# Patient Record
Sex: Female | Born: 1935 | Race: White | Hispanic: No | Smoking: Never smoker
Health system: Southern US, Community
[De-identification: ages and names within clinical notes are randomized; demographics above are authoritative.]

## PROBLEM LIST (undated history)

## (undated) DIAGNOSIS — K5792 Diverticulitis of intestine, part unspecified, without perforation or abscess without bleeding: Secondary | ICD-10-CM

## (undated) DIAGNOSIS — Z8719 Personal history of other diseases of the digestive system: Secondary | ICD-10-CM

## (undated) DIAGNOSIS — E559 Vitamin D deficiency, unspecified: Secondary | ICD-10-CM

## (undated) DIAGNOSIS — R011 Cardiac murmur, unspecified: Secondary | ICD-10-CM

## (undated) DIAGNOSIS — K219 Gastro-esophageal reflux disease without esophagitis: Secondary | ICD-10-CM

## (undated) DIAGNOSIS — E785 Hyperlipidemia, unspecified: Secondary | ICD-10-CM

## (undated) DIAGNOSIS — C801 Malignant (primary) neoplasm, unspecified: Secondary | ICD-10-CM

## (undated) DIAGNOSIS — R251 Tremor, unspecified: Secondary | ICD-10-CM

## (undated) DIAGNOSIS — I5033 Acute on chronic diastolic (congestive) heart failure: Secondary | ICD-10-CM

## (undated) DIAGNOSIS — M1990 Unspecified osteoarthritis, unspecified site: Secondary | ICD-10-CM

## (undated) DIAGNOSIS — K5730 Diverticulosis of large intestine without perforation or abscess without bleeding: Secondary | ICD-10-CM

## (undated) DIAGNOSIS — I8390 Asymptomatic varicose veins of unspecified lower extremity: Secondary | ICD-10-CM

## (undated) DIAGNOSIS — Z8701 Personal history of pneumonia (recurrent): Secondary | ICD-10-CM

## (undated) DIAGNOSIS — M810 Age-related osteoporosis without current pathological fracture: Secondary | ICD-10-CM

## (undated) DIAGNOSIS — R7303 Prediabetes: Secondary | ICD-10-CM

## (undated) DIAGNOSIS — IMO0002 Reserved for concepts with insufficient information to code with codable children: Secondary | ICD-10-CM

## (undated) DIAGNOSIS — I10 Essential (primary) hypertension: Secondary | ICD-10-CM

## (undated) HISTORY — PX: THYROID SURGERY: SHX805

## (undated) HISTORY — PX: OTHER SURGICAL HISTORY: SHX169

## (undated) HISTORY — DX: Reserved for concepts with insufficient information to code with codable children: IMO0002

## (undated) HISTORY — DX: Prediabetes: R73.03

## (undated) HISTORY — PX: APPENDECTOMY: SHX54

## (undated) HISTORY — DX: Diverticulosis of large intestine without perforation or abscess without bleeding: K57.30

## (undated) HISTORY — PX: BREAST SURGERY: SHX581

## (undated) HISTORY — PX: COLONOSCOPY: SHX174

## (undated) HISTORY — PX: WRIST FRACTURE SURGERY: SHX121

## (undated) HISTORY — PX: ESOPHAGOGASTRODUODENOSCOPY: SHX1529

## (undated) HISTORY — PX: BREAST EXCISIONAL BIOPSY: SUR124

## (undated) HISTORY — DX: Unspecified osteoarthritis, unspecified site: M19.90

## (undated) HISTORY — DX: Vitamin D deficiency, unspecified: E55.9

## (undated) HISTORY — PX: JOINT REPLACEMENT: SHX530

## (undated) HISTORY — PX: TUBAL LIGATION: SHX77

## (undated) HISTORY — DX: Hyperlipidemia, unspecified: E78.5

## (undated) HISTORY — PX: TONSILLECTOMY: SUR1361

---
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
## 1998-05-28 ENCOUNTER — Ambulatory Visit (HOSPITAL_COMMUNITY): Admission: RE | Admit: 1998-05-28 | Discharge: 1998-05-28 | Payer: Self-pay | Admitting: Surgery

## 1998-10-16 ENCOUNTER — Encounter: Payer: Self-pay | Admitting: Internal Medicine

## 1998-10-16 ENCOUNTER — Ambulatory Visit (HOSPITAL_COMMUNITY): Admission: RE | Admit: 1998-10-16 | Discharge: 1998-10-16 | Payer: Self-pay | Admitting: Internal Medicine

## 1999-05-07 ENCOUNTER — Other Ambulatory Visit: Admission: RE | Admit: 1999-05-07 | Discharge: 1999-05-07 | Payer: Self-pay | Admitting: Internal Medicine

## 1999-11-19 ENCOUNTER — Encounter: Payer: Self-pay | Admitting: Internal Medicine

## 1999-11-19 ENCOUNTER — Ambulatory Visit (HOSPITAL_COMMUNITY): Admission: RE | Admit: 1999-11-19 | Discharge: 1999-11-19 | Payer: Self-pay | Admitting: Internal Medicine

## 2000-02-26 ENCOUNTER — Other Ambulatory Visit: Admission: RE | Admit: 2000-02-26 | Discharge: 2000-02-26 | Payer: Self-pay | Admitting: Gynecology

## 2000-02-26 ENCOUNTER — Encounter (INDEPENDENT_AMBULATORY_CARE_PROVIDER_SITE_OTHER): Payer: Self-pay | Admitting: Specialist

## 2000-12-08 ENCOUNTER — Ambulatory Visit (HOSPITAL_COMMUNITY): Admission: RE | Admit: 2000-12-08 | Discharge: 2000-12-08 | Payer: Self-pay | Admitting: Internal Medicine

## 2000-12-08 ENCOUNTER — Encounter: Payer: Self-pay | Admitting: Internal Medicine

## 2001-08-03 ENCOUNTER — Other Ambulatory Visit: Admission: RE | Admit: 2001-08-03 | Discharge: 2001-08-03 | Payer: Self-pay | Admitting: Gynecology

## 2001-08-17 ENCOUNTER — Encounter: Admission: RE | Admit: 2001-08-17 | Discharge: 2001-08-17 | Payer: Self-pay | Admitting: Gynecology

## 2001-08-17 ENCOUNTER — Encounter: Payer: Self-pay | Admitting: Gynecology

## 2001-12-22 ENCOUNTER — Encounter: Payer: Self-pay | Admitting: Internal Medicine

## 2001-12-22 ENCOUNTER — Ambulatory Visit (HOSPITAL_COMMUNITY): Admission: RE | Admit: 2001-12-22 | Discharge: 2001-12-22 | Payer: Self-pay | Admitting: Internal Medicine

## 2002-01-18 ENCOUNTER — Ambulatory Visit (HOSPITAL_COMMUNITY): Admission: RE | Admit: 2002-01-18 | Discharge: 2002-01-18 | Payer: Self-pay | Admitting: Gynecology

## 2002-01-18 ENCOUNTER — Encounter: Payer: Self-pay | Admitting: Gynecology

## 2002-08-16 ENCOUNTER — Other Ambulatory Visit: Admission: RE | Admit: 2002-08-16 | Discharge: 2002-08-16 | Payer: Self-pay | Admitting: Gynecology

## 2003-02-14 ENCOUNTER — Encounter: Payer: Self-pay | Admitting: Internal Medicine

## 2003-02-14 ENCOUNTER — Ambulatory Visit (HOSPITAL_COMMUNITY): Admission: RE | Admit: 2003-02-14 | Discharge: 2003-02-14 | Payer: Self-pay | Admitting: Internal Medicine

## 2003-09-10 ENCOUNTER — Other Ambulatory Visit: Admission: RE | Admit: 2003-09-10 | Discharge: 2003-09-10 | Payer: Self-pay | Admitting: Gynecology

## 2004-02-09 ENCOUNTER — Emergency Department (HOSPITAL_COMMUNITY): Admission: EM | Admit: 2004-02-09 | Discharge: 2004-02-09 | Payer: Self-pay | Admitting: *Deleted

## 2004-04-22 ENCOUNTER — Ambulatory Visit (HOSPITAL_COMMUNITY): Admission: RE | Admit: 2004-04-22 | Discharge: 2004-04-22 | Payer: Self-pay | Admitting: Internal Medicine

## 2004-12-01 ENCOUNTER — Ambulatory Visit (HOSPITAL_COMMUNITY): Admission: RE | Admit: 2004-12-01 | Discharge: 2004-12-01 | Payer: Self-pay | Admitting: Internal Medicine

## 2004-12-05 ENCOUNTER — Ambulatory Visit (HOSPITAL_COMMUNITY): Admission: RE | Admit: 2004-12-05 | Discharge: 2004-12-05 | Payer: Self-pay | Admitting: Internal Medicine

## 2005-04-24 ENCOUNTER — Ambulatory Visit (HOSPITAL_COMMUNITY): Admission: RE | Admit: 2005-04-24 | Discharge: 2005-04-24 | Payer: Self-pay | Admitting: Internal Medicine

## 2005-12-28 ENCOUNTER — Other Ambulatory Visit: Admission: RE | Admit: 2005-12-28 | Discharge: 2005-12-28 | Payer: Self-pay | Admitting: Gynecology

## 2006-02-25 ENCOUNTER — Ambulatory Visit (HOSPITAL_COMMUNITY): Admission: RE | Admit: 2006-02-25 | Discharge: 2006-02-25 | Payer: Self-pay | Admitting: Internal Medicine

## 2006-04-27 ENCOUNTER — Ambulatory Visit (HOSPITAL_COMMUNITY): Admission: RE | Admit: 2006-04-27 | Discharge: 2006-04-27 | Payer: Self-pay | Admitting: Internal Medicine

## 2006-10-19 ENCOUNTER — Emergency Department (HOSPITAL_COMMUNITY): Admission: EM | Admit: 2006-10-19 | Discharge: 2006-10-19 | Payer: Self-pay | Admitting: Emergency Medicine

## 2006-11-22 ENCOUNTER — Encounter: Admission: RE | Admit: 2006-11-22 | Discharge: 2006-11-22 | Payer: Self-pay | Admitting: Family Medicine

## 2006-11-24 ENCOUNTER — Ambulatory Visit: Payer: Self-pay | Admitting: Gastroenterology

## 2006-11-26 ENCOUNTER — Encounter: Admission: RE | Admit: 2006-11-26 | Discharge: 2006-11-26 | Payer: Self-pay | Admitting: Internal Medicine

## 2006-11-30 ENCOUNTER — Ambulatory Visit: Payer: Self-pay | Admitting: Gastroenterology

## 2007-01-04 ENCOUNTER — Ambulatory Visit: Payer: Self-pay | Admitting: Gastroenterology

## 2007-04-29 ENCOUNTER — Ambulatory Visit (HOSPITAL_COMMUNITY): Admission: RE | Admit: 2007-04-29 | Discharge: 2007-04-29 | Payer: Self-pay | Admitting: Internal Medicine

## 2008-05-14 ENCOUNTER — Ambulatory Visit (HOSPITAL_COMMUNITY): Admission: RE | Admit: 2008-05-14 | Discharge: 2008-05-14 | Payer: Self-pay | Admitting: Internal Medicine

## 2008-08-15 ENCOUNTER — Ambulatory Visit (HOSPITAL_COMMUNITY): Admission: RE | Admit: 2008-08-15 | Discharge: 2008-08-15 | Payer: Self-pay | Admitting: Internal Medicine

## 2008-10-08 ENCOUNTER — Ambulatory Visit (HOSPITAL_COMMUNITY): Admission: RE | Admit: 2008-10-08 | Discharge: 2008-10-08 | Payer: Self-pay | Admitting: Internal Medicine

## 2008-10-16 LAB — HM DEXA SCAN

## 2008-10-17 ENCOUNTER — Ambulatory Visit (HOSPITAL_COMMUNITY): Admission: RE | Admit: 2008-10-17 | Discharge: 2008-10-17 | Payer: Self-pay | Admitting: Internal Medicine

## 2009-05-21 ENCOUNTER — Ambulatory Visit (HOSPITAL_COMMUNITY): Admission: RE | Admit: 2009-05-21 | Discharge: 2009-05-21 | Payer: Self-pay | Admitting: Internal Medicine

## 2009-10-21 ENCOUNTER — Encounter: Payer: Self-pay | Admitting: Gastroenterology

## 2009-11-06 ENCOUNTER — Encounter (HOSPITAL_COMMUNITY): Admission: RE | Admit: 2009-11-06 | Discharge: 2010-01-20 | Payer: Self-pay | Admitting: Internal Medicine

## 2009-12-11 ENCOUNTER — Encounter: Payer: Self-pay | Admitting: Gastroenterology

## 2009-12-12 ENCOUNTER — Telehealth: Payer: Self-pay | Admitting: Gastroenterology

## 2010-01-20 ENCOUNTER — Ambulatory Visit: Payer: Self-pay | Admitting: Gastroenterology

## 2010-01-21 ENCOUNTER — Ambulatory Visit: Payer: Self-pay | Admitting: Gastroenterology

## 2010-03-10 ENCOUNTER — Encounter (INDEPENDENT_AMBULATORY_CARE_PROVIDER_SITE_OTHER): Payer: Self-pay | Admitting: Surgery

## 2010-03-10 ENCOUNTER — Observation Stay (HOSPITAL_COMMUNITY): Admission: RE | Admit: 2010-03-10 | Discharge: 2010-03-11 | Payer: Self-pay | Admitting: Surgery

## 2010-04-16 ENCOUNTER — Inpatient Hospital Stay (HOSPITAL_COMMUNITY): Admission: RE | Admit: 2010-04-16 | Discharge: 2010-04-20 | Payer: Self-pay | Admitting: Orthopedic Surgery

## 2010-06-30 ENCOUNTER — Ambulatory Visit (HOSPITAL_COMMUNITY): Admission: RE | Admit: 2010-06-30 | Discharge: 2010-06-30 | Payer: Self-pay | Admitting: Internal Medicine

## 2010-12-07 ENCOUNTER — Encounter: Payer: Self-pay | Admitting: Internal Medicine

## 2010-12-16 NOTE — Procedures (Signed)
Summary: Colonoscopy  Patient: Briana [name] Note: All result statuses are Final unless otherwise noted.  Tests: (1) Colonoscopy (COL)   COL Colonoscopy           [employer] [employer].     [place], [place]  [id number]           COLONOSCOPY PROCEDURE REPORT           PATIENT:  Briana [name], Briana [name]  MR#:  [id number]     BIRTHDATE:  02-15-1934, 76 yrs. old  GENDER:  female           ENDOSCOPIST:  Norberto Sorenson T. Fuller Plan, MD, [facility]     Referred by:           PROCEDURE DATE:  01/21/2010     PROCEDURE:  Colonoscopy [id number]     ASA CLASS:  Class II     INDICATIONS:  1) hematochezia 2) LLQ pain           MEDICATIONS:   Fentanyl 100 mcg IV, Versed 10 mg IV           DESCRIPTION OF PROCEDURE:   After the risks benefits and     alternatives of the procedure were thoroughly explained, informed     consent was obtained.  Digital rectal exam was performed and     revealed no abnormalities.   The LB PCF-H180AL [id number] endoscope     was introduced through the anus and advanced to the cecum, which     was identified by both the appendix and ileocecal valve, without     limitations.  The quality of the prep was good, using MoviPrep.     The instrument was then slowly withdrawn as the colon was fully     examined.     <<PROCEDUREIMAGES>>           FINDINGS:  Moderate diverticulosis was found sigmoid to descending     A normal appearing cecum, ileocecal valve, and appendiceal orifice     were identified. The ascending, hepatic flexure, transverse,     splenic flexure, descending, sigmoid colon, and rectum appeared     unremarkable.   Retroflexed views in the rectum revealed internal     hemorrhoids., small.  The time to cecum =  6  minutes. The scope     was then withdrawn (time =  10  min) from the patient and the     procedure completed.           COMPLICATIONS:  None           ENDOSCOPIC IMPRESSION:     1) Moderate diverticulosis in the sigmoid to descending     2) Internal  hemorrhoids           RECOMMENDATIONS:     1) High fiber diet with liberal fluid intake.     2) Continue current colorectal screening recommendations for     "routine risk" patients with a repeat colonoscopy in 10 years.     3) Presumed hemorrhoidal bleeding has resolved     4) Consider antispasmotics if LLQ pain persists           Malcolm T. Fuller Plan, MD, Mar[facility] Regal           CC: Johnathan Hausen, MD, Gaynelle Arabian, MD           n.     Lorrin MaisPricilla Riffle. Stark at 01/21/2010 04:40 PM  [place], [place], [id number]  Note: An exclamation mark (!) indicates a result that was not dispersed into the flowsheet. Document Creation Date: 01/21/2010 4:40 PM _______________________________________________________________________  (1) Order result status: Final Collection or observation date-time: 01/21/2010 16:34 Requested date-time:  Receipt date-time:  Reported date-time:  Referring Physician:   Ordering Physician: Lucio Edward [phone]) Specimen Source:  Source: Tawanna Cooler Order Number: [id number] Lab site:   Appended Document: Colonoscopy    Clinical Lists Changes  Observations: Added new observation of COLONNXTDUE: 01/2020 (01/21/2010 16:41)

## 2010-12-16 NOTE — Procedures (Signed)
Summary: Colonoscopy   Colonoscopy  Procedure date:  11/30/2006  Findings:      Location:  [place].   Patient Name: Briana [name], Briana [name]. MRN:  Procedure Procedures: Colonoscopy CPT: [id number].  Personnel: Endoscopist: [name]. Fuller Plan, MD, [facility] Reg[facility].  Referred By: Kirtland Bouchard, MD.  Exam Location: Exam performed in Outpatient Clinic. Outpatient  Patient Consent: Procedure, Alternatives, Risks and Benefits discussed, consent obtained, from patient. Consent was obtained by the RN.  Indications  Abnormal Exams, Studies: CT scan, abnormal, do not suspect malignancy.  Comments: Ongoing LLQ pain, recently resolved diverticulitis, thickened sigmoid colon on F/U CT scan. History  Current Medications: Patient is not currently taking Coumadin.  Pre-Exam Physical: Performed Nov 30, 2006. Cardio-pulmonary exam, Rectal exam, HEENT exam , Abdominal exam, Mental status exam WNL.  Comments: Pt. history reviewed/updated, physical exam performed prior to initiation of sedation?Yes Exam Exam: Extent of exam reached: Cecum, extent intended: Cecum.  The cecum was identified by appendiceal orifice and IC valve. Time to Cecum: 00:04: 48. Time for Withdrawl: 00:10:52. Colon retroflexion performed. ASA Classification: II. Tolerance: excellent.  Monitoring: Pulse and BP monitoring, Oximetry used. Supplemental O2 given.  Colon Prep Used MoviPrep for colon prep. Prep results: good.  Sedation Meds: Patient assessed and found to be appropriate for moderate (conscious) sedation. Fentanyl 100 mcg. given IV. Versed 10 mg. given IV.  Findings - DIVERTICULOSIS: Descending Colon to Sigmoid Colon. Not bleeding. ICD9: Diverticulosis: 562.10. Comments: with spasm.  NORMAL EXAM: Cecum to Splenic Flexure.  HEMORRHOIDS: Internal. Size: Small. Not bleeding. Not thrombosed. ICD9: Hemorrhoids, Internal: 455.0.   Assessment  Diagnoses: 562.10: Diverticulosis.  455.0: Hemorrhoids, Internal.    Events  Unplanned Interventions: No intervention was required.  Unplanned Events: There were no complications. Plans  Post Exam Instructions: Post sedation instructions given.  Medication Plan: Antispasmodics: Hyoscyamine PO 30 mg BID, for 4 wks.   Patient Education: Patient given standard instructions for: Diverticulosis. Hemorrhoids.  Disposition: After procedure patient sent to recovery. After recovery patient sent home.  Scheduling/Referral: Office Visit, to [employer]. Fuller Plan, MD, [facility], around Dec 31, 2006.    cc: Unk Pinto, MD  This report was created from the original endoscopy report, which was reviewed and signed by the above listed endoscopist.

## 2010-12-16 NOTE — Assessment & Plan Note (Signed)
Summary: discuss possible colon/follow up triage call/sheri   History of Present Illness Visit Type: Follow-up Visit Primary GI MD: Joylene Igo MD [facility] Primary Provider: Unk Pinto, MD Chief Complaint: possible colonoscopy before hip replacement surgery, patient had small amount of blood in stool 1 time History of Present Illness:   Briana [name] returns for followup of her daughter on the advice of Dr. Johnathan Hausen. She had several episodes of small volume hematochezia while on NSAIDs. She previously underwent colonoscopy in January 2008 which showed diverticulosis with spasm and internal hemorrhoids. Dr. Hassell Done requested further evaluation of her rectal bleeding  prior to scheduling a parathyroid adenectomy. Subsequent to that she has plans for left hip replacement with Dr. Hector Shade.  She has mild chronic left lower quadrant pain with mild constipation. Neither have changed in frequency, or severity.   GI Review of Systems    Reports abdominal pain and  acid reflux.     Location of  Abdominal pain: left side.    Denies belching, bloating, chest pain, dysphagia with liquids, dysphagia with solids, heartburn, loss of appetite, nausea, vomiting, vomiting blood, weight loss, and  weight gain.      Reports constipation and  rectal bleeding.     Denies anal fissure, black tarry stools, change in bowel habit, diarrhea, diverticulosis, fecal incontinence, heme positive stool, hemorrhoids, irritable bowel syndrome, jaundice, light color stool, liver problems, and  rectal pain.   Current Medications (verified): 1)  Pravastatin Sodium 40 Mg Tabs (Pravastatin Sodium) .... Take 1/2 Tablet At Bedtime 2)  Evista 60 Mg Tabs (Raloxifene Hcl) .... Take 1 Tablet By Mouth Once Daily 3)  Ranitidine Hcl 150 Mg Tabs (Ranitidine Hcl) .... Take 1 Tablet By Mouth Two Times A Day 4)  Benicar 40 Mg Tabs (Olmesartan Medoxomil) .... Take 1/2 Tablet By Mouth Once Daily 5)  Bumetanide 1 Mg Tabs (Bumetanide)  .... Take 1/2 Tablet By Mouth Once Daily 6)  Aspir-Low 81 Mg Tbec (Aspirin) .... Once Daily 7)  Multivitamins  Tabs (Multiple Vitamin) .... Once Daily 8)  Osteo Bi-Flex Adv Triple St  Tabs (Misc Natural Products) .... Two Times A Day 9)  Vitamin D 2000iu .... Two Times A Day  Allergies (verified): No Known Drug Allergies  Past History:  Past Medical History: Reviewed history from 01/17/2010 and no changes required. Diverticulitis Diverticulosis GERD Hyperlipidemia Hypertension Skin cancer  Hemorrhoids  Past Surgical History: Appendectomy Bilateral tubal ligation C-section Varicose vein stripping Uterine polyps removed Breast surgery-benign cyst Arm surgery (L) x2  Family History: Reviewed history from 01/17/2010 and no changes required. Family History of Heart Disease: Sister Family History of Diabetes: Sister  Social History: Reviewed history from 01/17/2010 and no changes required. Widowed Retired Patient has never smoked.  Alcohol Use - no Illicit Drug Use - no  Review of Systems       The patient complains of allergy/sinus, arthritis/joint pain, sleeping problems, and urine leakage.  The patient denies anemia, anxiety-new, back pain, blood in urine, breast changes/lumps, change in vision, confusion, cough, coughing up blood, depression-new, fainting, fatigue, fever, headaches-new, hearing problems, heart murmur, heart rhythm changes, itching, menstrual pain, muscle pains/cramps, nosebleeds, pregnancy symptoms, shortness of breath, skin rash, sore throat, swelling of feet/legs, swollen lymph glands, thirst - excessive , urination - excessive , urination changes/pain, vision changes, and voice change.    Vital Signs:  Patient profile:   77 year old female Height:      62 inches Weight:  172 pounds BMI:     31.57 Pulse rate:   68 / minute Pulse rhythm:   regular BP sitting:   120 / 70  (left arm) Cuff size:   regular  Vitals Entered By: June McMurray [place]  Deborra Medina) (January 20, 2010 3:18 PM)  Physical Exam  General:  Well developed, well nourished, no acute distress. Head:  Normocephalic and atraumatic. Eyes:  PERRLA, no icterus. Mouth:  No deformity or lesions, dentition normal. Neck:  Supple; no masses or thyromegaly. Lungs:  Clear throughout to auscultation. Abdomen:  Soft and nondistended. No masses, hepatosplenomegaly or hernias noted. Normal bowel sounds. Minimal left lower quadrant tenderness to deep palpation without rebound or guarding. Rectal:  deferred until time of colonoscopy.   Neurologic:  Alert and  oriented x4;  grossly normal neurologically. Cervical Nodes:  No significant cervical adenopathy. Inguinal Nodes:  No significant inguinal adenopathy. Psych:  Alert and cooperative. Normal mood and affect.  Impression & Recommendations:  Problem # 1:  BLOOD IN STOOL (ICD-578.1) Suspected hemorrhoidal bleeding. Rule out colorectal neoplasms, proctitis, and other disorders. The risks, benefits and alternatives to colonoscopy with possible biopsy, possible destruction of hemorrhoids and possible polypectomy were discussed with the patient and they consent to proceed. The procedure will be scheduled electively. Orders: Colonoscopy (Colon)  Problem # 2:  ABDOMINAL PAIN-LLQ (ICD-789.04) Chronic intermittent mild left lower quadrant pain with mild constipation. Rule out IBS or painful diverticular disease. Maintain a high-fiber diet with adequate water intake. Consider trial of antispasmodics pending colonoscopy.  Patient Instructions: 1)  Colonoscopy brochure given.  2)  Conscious Sedation brochure given.  3)  Copy sent to : Unk Pinto, MD 4)                          Johnathan Hausen, MD 5)                          Hector Shade, MD 6)  The medication list was reviewed and reconciled.  All changed / newly prescribed medications were explained.  A complete medication list was provided to the patient /  caregiver.  Prescriptions: MOVIPREP 100 GM  SOLR (PEG-KCL-NACL-NASULF-NA ASC-C) As per prep instructions.  #1 x 0   Entered by:   Marlon Pel CMA ([place])   Authorized by:   Ladene Artist MD [facility]   Signed by:   Ladene Artist MD [facility] on 01/20/2010   Method used:   Electronically to        [facility] (retail)       [address]       [place],   [address]       Ph: [phone]       Fax: [phone]   RxID:   [id number]

## 2010-12-16 NOTE — Letter (Signed)
Summary: [facility] Instructions  [facility]  [place], [place] [address]   Phone: [phone]  Fax: [phone]       Briana [name]    1934/03/27    MRN: [id number]        Procedure Day /Date: Tuesday March 8th, 2011     Arrival Time: 3:00pm     Procedure Time: 4:00pm     Location of Procedure:                    _x _  [place] (4th Floor)                        PREPARATION FOR COLONOSCOPY WITH MOVIPREP     THE DAY BEFORE YOUR PROCEDURE         DATE:  today   DAY:   1.  Drink clear liquids the entire day-NO SOLID FOOD  2.  Do not drink anything colored red or purple.  Avoid juices with pulp.  No orange juice.  3.  Drink at least 64 oz. (8 glasses) of fluid/clear liquids during the day to prevent dehydration and help the prep work efficiently.  CLEAR LIQUIDS INCLUDE: Water Jello Ice Popsicles Tea (sugar ok, no milk/cream) Powdered fruit flavored drinks Coffee (sugar ok, no milk/cream) Gatorade Juice: apple, white grape, white cranberry  Lemonade Clear bullion, consomm, broth Carbonated beverages (any kind) Strained chicken noodle soup Hard Candy                             4.  In the morning, mix first dose of MoviPrep solution:    Empty 1 Pouch A and 1 Pouch B into the disposable container    Add lukewarm drinking water to the top line of the container. Mix to dissolve    Refrigerate (mixed solution should be used within 24 hrs)  5.  Begin drinking the prep at 5:00 p.m. The MoviPrep container is divided by 4 marks.   Every 15 minutes drink the solution down to the next mark (approximately 8 oz) until the full liter is complete.   6.  Follow completed prep with 16 oz of clear liquid of your choice (Nothing red or purple).  Continue to drink clear liquids until bedtime.  7.  Before going to bed, mix second dose of MoviPrep solution:    Empty 1 Pouch A and 1 Pouch B into the disposable container    Add  lukewarm drinking water to the top line of the container. Mix to dissolve    Refrigerate  THE DAY OF YOUR PROCEDURE      DATE:  01/21/10 DAY:  Tuesday  Beginning at  11:00a.m. (5 hours before procedure):         1. Every 15 minutes, drink the solution down to the next mark (approx 8 oz) until the full liter is complete.  2. Follow completed prep with 16 oz. of clear liquid of your choice.    3. You may drink clear liquids until 2:00pm (2 HOURS BEFORE PROCEDURE).   MEDICATION INSTRUCTIONS  Unless otherwise instructed, you should take regular prescription medications with a small sip of water   as early as possible the morning of your procedure.          OTHER INSTRUCTIONS  You will need a responsible adult at least 75 years of age to accompany you and drive  you home.   This person must remain in the waiting room during your procedure.  Wear loose fitting clothing that is easily removed.  Leave jewelry and other valuables at home.  However, you may wish to bring a book to read or  an iPod/MP3 player to listen to music as you wait for your procedure to start.  Remove all body piercing jewelry and leave at home.  Total time from sign-in until discharge is approximately 2-3 hours.  You should go home directly after your procedure and rest.  You can resume normal activities the  day after your procedure.  The day of your procedure you should not:   Drive   Make legal decisions   Operate machinery   Drink alcohol   Return to work  You will receive specific instructions about eating, activities and medications before you leave.    The above instructions have been reviewed and explained to me by   Estill Bamberg.     I fully understand and can verbalize these instructions _____________________________ Date _________

## 2010-12-16 NOTE — Progress Notes (Signed)
Summary: Triage  Phone Note Call from Patient Call back at Home Phone [phone]   Caller: Patient Call For: Dr. Fuller Plan Reason for Call: Talk to Nurse Summary of Call: Pt. has had some rectal bleeding. She also saw Dr. Hassell Done and he is suggesting another colonoscopy. Wants to talk with you concerning that. Initial call taken by: Webb Laws,  December 12, 2009 11:04 AM  Follow-up for Phone Call        Patient was seen by Dr Kaylyn Lim for consult for  a thyroidectomy, during her appointment with him she mentioned that a few months ago she had an isolated incident of a large amount of bright red rectal bleeding.  She was on Meloxicam at that time and she feels this was the cause, she stopped this and had no other bleeding.  This was an isolated incident.  Dr Hassell Done is recommending a colon.  Please advise if this is ok for a direct schedule or should she have REV.  I have copied in her last colon from 08 Follow-up by: [name] RN, CGRN,  December 12, 2009 11:17 AM  Additional Follow-up for Phone Call Additional follow up Details #1::        Please FAX a copy of the colonoscopy to Dr. Hassell Done. Not sure she needs another colonoscopy. Please schedule REV to further discuss. Additional Follow-up by: Ladene Artist MD Marval Regal,  December 13, 2009 1:00 PM    Additional Follow-up for Phone Call Additional follow up Details #2::    Left message for patient to call back [place], CGRN  December 13, 2009 1:19 PM  REV scheduled with patient for 12-31-09 2:15 Follow-up by: [name] RN, CGRN,  December 16, 2009 8:45 AM

## 2010-12-16 NOTE — Letter (Signed)
Summary: [facility]   Imported By: Bubba Hales 12/30/2009 09:13:54  _____________________________________________________________________  External Attachment:    Type:   Image     Comment:   External Document

## 2011-02-02 LAB — CBC
HCT: 25.5 % — ABNORMAL LOW (ref 36.0–46.0)
HCT: 28.3 % — ABNORMAL LOW (ref 36.0–46.0)
HCT: 29.4 % — ABNORMAL LOW (ref 36.0–46.0)
HCT: 37.9 % (ref 36.0–46.0)
Hemoglobin: 8.6 g/dL — ABNORMAL LOW (ref 12.0–15.0)
Hemoglobin: 9.6 g/dL — ABNORMAL LOW (ref 12.0–15.0)
Hemoglobin: 9.7 g/dL — ABNORMAL LOW (ref 12.0–15.0)
Hemoglobin: 12.6 g/dL (ref 12.0–15.0)
MCHC: 33 g/dL (ref 30.0–36.0)
MCHC: 33.6 g/dL (ref 30.0–36.0)
MCHC: 33.8 g/dL (ref 30.0–36.0)
MCHC: 33.3 g/dL (ref 30.0–36.0)
MCV: 93.1 fL (ref 78.0–100.0)
MCV: 93.4 fL (ref 78.0–100.0)
MCV: 94.6 fL (ref 78.0–100.0)
MCV: 92.8 fL (ref 78.0–100.0)
Platelets: 142 10*3/uL — ABNORMAL LOW (ref 150–400)
Platelets: 150 10*3/uL (ref 150–400)
Platelets: 164 10*3/uL (ref 150–400)
Platelets: 172 10*3/uL (ref 150–400)
RBC: 2.73 MIL/uL — ABNORMAL LOW (ref 3.87–5.11)
RBC: 3.04 MIL/uL — ABNORMAL LOW (ref 3.87–5.11)
RBC: 3.1 MIL/uL — ABNORMAL LOW (ref 3.87–5.11)
RBC: 4.09 MIL/uL (ref 3.87–5.11)
RDW: 13.4 % (ref 11.5–15.5)
RDW: 13.6 % (ref 11.5–15.5)
RDW: 13.7 % (ref 11.5–15.5)
RDW: 13.4 % (ref 11.5–15.5)
WBC: 13.6 10*3/uL — ABNORMAL HIGH (ref 4.0–10.5)
WBC: 15.4 10*3/uL — ABNORMAL HIGH (ref 4.0–10.5)
WBC: 9 10*3/uL (ref 4.0–10.5)
WBC: 7.4 10*3/uL (ref 4.0–10.5)

## 2011-02-02 LAB — BASIC METABOLIC PANEL
BUN: 11 mg/dL (ref 6–23)
BUN: 8 mg/dL (ref 6–23)
CO2: 29 mEq/L (ref 19–32)
CO2: 31 mEq/L (ref 19–32)
Calcium: 8.4 mg/dL (ref 8.4–10.5)
Calcium: 8.5 mg/dL (ref 8.4–10.5)
Chloride: 102 mEq/L (ref 96–112)
Chloride: 105 mEq/L (ref 96–112)
Creatinine, Ser: 0.88 mg/dL (ref 0.4–1.2)
Creatinine, Ser: 0.91 mg/dL (ref 0.4–1.2)
GFR calc Af Amer: >60 mL/min (ref >60)
GFR calc Af Amer: >60 mL/min (ref >60)
GFR calc non Af Amer: >60 mL/min (ref >60)
GFR calc non Af Amer: >60 mL/min (ref >60)
Glucose, Bld: 138 mg/dL — ABNORMAL HIGH (ref 70–99)
Glucose, Bld: 149 mg/dL — ABNORMAL HIGH (ref 70–99)
Potassium: 4.3 mEq/L (ref 3.5–5.1)
Potassium: 4.9 mEq/L (ref 3.5–5.1)
Sodium: 137 mEq/L (ref 135–145)
Sodium: 139 mEq/L (ref 135–145)

## 2011-02-02 LAB — COMPREHENSIVE METABOLIC PANEL
ALT: 18 U/L (ref 0–35)
AST: 21 U/L (ref 0–37)
Albumin: 4.4 g/dL (ref 3.5–5.2)
Alkaline Phosphatase: 55 U/L (ref 39–117)
BUN: 18 mg/dL (ref 6–23)
CO2: 31 mEq/L (ref 19–32)
Calcium: 9.3 mg/dL (ref 8.4–10.5)
Chloride: 99 mEq/L (ref 96–112)
Creatinine, Ser: 1 mg/dL (ref 0.4–1.2)
GFR calc Af Amer: >60 mL/min (ref >60)
GFR calc non Af Amer: 54 mL/min — ABNORMAL LOW (ref >60)
Glucose, Bld: 114 mg/dL — ABNORMAL HIGH (ref 70–99)
Potassium: 3.8 mEq/L (ref 3.5–5.1)
Sodium: 139 mEq/L (ref 135–145)
Total Bilirubin: 1 mg/dL (ref 0.3–1.2)
Total Protein: 6.7 g/dL (ref 6.0–8.3)

## 2011-02-02 LAB — GLUCOSE, CAPILLARY
Glucose-Capillary: 126 mg/dL — ABNORMAL HIGH (ref 70–99)
Glucose-Capillary: 135 mg/dL — ABNORMAL HIGH (ref 70–99)

## 2011-02-02 LAB — HEMOGLOBIN AND HEMATOCRIT, BLOOD
HCT: 31 % — ABNORMAL LOW (ref 36.0–46.0)
Hemoglobin: 10.6 g/dL — ABNORMAL LOW (ref 12.0–15.0)

## 2011-02-02 LAB — TYPE AND SCREEN
ABO/RH(D): O POS
Antibody Screen: POSITIVE
DAT, IgG: NEGATIVE
Donor AG Type: NEGATIVE
Donor AG Type: NEGATIVE
PT AG Type: NEGATIVE

## 2011-02-02 LAB — URINE MICROSCOPIC-ADD ON

## 2011-02-02 LAB — URINALYSIS, ROUTINE W REFLEX MICROSCOPIC
Bilirubin Urine: NEGATIVE
Glucose, UA: NEGATIVE mg/dL
Hgb urine dipstick: NEGATIVE
Ketones, ur: NEGATIVE mg/dL
Nitrite: NEGATIVE
Protein, ur: NEGATIVE mg/dL
Specific Gravity, Urine: 1.007 (ref 1.005–1.030)
Urobilinogen, UA: 0.2 mg/dL (ref 0.0–1.0)
pH: 6.5 (ref 5.0–8.0)

## 2011-02-02 LAB — PROTIME-INR
INR: 1.26 (ref 0.00–1.49)
INR: 1.58 — ABNORMAL HIGH (ref 0.00–1.49)
INR: 1.71 — ABNORMAL HIGH (ref 0.00–1.49)
INR: 1.74 — ABNORMAL HIGH (ref 0.00–1.49)
INR: 1.04 (ref 0.00–1.49)
Prothrombin Time: 15.7 seconds — ABNORMAL HIGH (ref 11.6–15.2)
Prothrombin Time: 18.7 seconds — ABNORMAL HIGH (ref 11.6–15.2)
Prothrombin Time: 19.9 seconds — ABNORMAL HIGH (ref 11.6–15.2)
Prothrombin Time: 20.2 seconds — ABNORMAL HIGH (ref 11.6–15.2)
Prothrombin Time: 13.5 seconds (ref 11.6–15.2)

## 2011-02-02 LAB — ABO/RH: ABO/RH(D): O POS

## 2011-02-02 LAB — APTT: aPTT: 29 seconds (ref 24–37)

## 2011-02-02 LAB — PREPARE RBC (CROSSMATCH)

## 2011-02-03 LAB — CBC
HCT: 40 % (ref 36.0–46.0)
Hemoglobin: 13.9 g/dL (ref 12.0–15.0)
MCHC: 34.8 g/dL (ref 30.0–36.0)
MCV: 92.2 fL (ref 78.0–100.0)
Platelets: 164 10*3/uL (ref 150–400)
RBC: 4.34 MIL/uL (ref 3.87–5.11)
RDW: 13.6 % (ref 11.5–15.5)
WBC: 4.4 10*3/uL (ref 4.0–10.5)

## 2011-02-03 LAB — BASIC METABOLIC PANEL
BUN: 17 mg/dL (ref 6–23)
CO2: 32 mEq/L (ref 19–32)
Calcium: 10.9 mg/dL — ABNORMAL HIGH (ref 8.4–10.5)
Chloride: 99 mEq/L (ref 96–112)
Creatinine, Ser: 0.94 mg/dL (ref 0.4–1.2)
GFR calc Af Amer: >60 mL/min (ref >60)
GFR calc non Af Amer: 58 mL/min — ABNORMAL LOW (ref >60)
Glucose, Bld: 119 mg/dL — ABNORMAL HIGH (ref 70–99)
Potassium: 4.2 mEq/L (ref 3.5–5.1)
Sodium: 137 mEq/L (ref 135–145)

## 2011-02-03 LAB — CALCIUM
Calcium: 8.3 mg/dL — ABNORMAL LOW (ref 8.4–10.5)
Calcium: 8.9 mg/dL (ref 8.4–10.5)

## 2011-02-03 LAB — GLUCOSE, CAPILLARY
Glucose-Capillary: 123 mg/dL — ABNORMAL HIGH (ref 70–99)
Glucose-Capillary: 133 mg/dL — ABNORMAL HIGH (ref 70–99)
Glucose-Capillary: 141 mg/dL — ABNORMAL HIGH (ref 70–99)
Glucose-Capillary: 177 mg/dL — ABNORMAL HIGH (ref 70–99)

## 2011-03-19 ENCOUNTER — Other Ambulatory Visit: Payer: Self-pay | Admitting: Dermatology

## 2011-03-20 ENCOUNTER — Other Ambulatory Visit (HOSPITAL_COMMUNITY)
Admission: RE | Admit: 2011-03-20 | Discharge: 2011-03-20 | Disposition: A | Discharge status: Home / Self Care | Payer: Medicare Other | Source: Ambulatory Visit | Attending: Internal Medicine | Admitting: Internal Medicine

## 2011-03-20 ENCOUNTER — Other Ambulatory Visit: Payer: Self-pay | Admitting: Internal Medicine

## 2011-03-20 DIAGNOSIS — Z124 Encounter for screening for malignant neoplasm of cervix: Secondary | ICD-10-CM | POA: Insufficient documentation

## 2011-03-20 DIAGNOSIS — Z1159 Encounter for screening for other viral diseases: Secondary | ICD-10-CM | POA: Insufficient documentation

## 2011-04-03 NOTE — Assessment & Plan Note (Signed)
[place] OFFICE NOTE   Briana [name], Briana [name]                        MRN:          [id number]  DATE:11/24/2006                            DOB:          [birth year]    REASON FOR REFERRAL:  Left lower quadrant abdominal pain and recent  diverticulitis.   HISTORY OF PRESENT ILLNESS:  Briana [name] is a very nice 77 year old white  female who presented to the [facility] emergency room on  December 4 with abdominal pain.  She describes mild lower abdominal pain  that increased in intensity over the course of several days.  She had  anorexia, constipation and mucus in her stool.  A CT scan of the abdomen  and pelvis was performed which showed a long segment of the distal  sigmoid colon with surrounding inflammatory changes suggestive of  diverticulitis.  White blood cell count was 9.8 and hemoglobin was 14.3.  She was placed on Cipro, Flagyl, and hydrocodone, and her symptoms  substantially improved over the course of several days.  She was then  seen in followup by Dr. Melford Aase with persistent left lower quadrant pain  and a followup CT scan was ordered on November 22, 2006, which showed  resolution of the areas of diverticulitis and sigmoid colon wall  thickening.  Diverticulosis was noted and examination of the uterus was  prominent and suspicious for fibroids.  She has ongoing mild left lower  quadrant pain with associated gas and bloating.  A course of Doxycycline  was prescribed. Her symptoms have substantially improved but have not  completely resolved.  There is no family history of colon cancer, colon  polyps, or inflammatory bowel disease.   PAST MEDICAL HISTORY:  1. Hypertension.  2. Hyperlipidemia.  3. GERD.  4. Status post appendectomy.  5. Status post bilateral tubal ligation.  6. Status post C-section.  7. Status post varicose vein stripping.  8. Status post uterine polyps removed.  9. Status  post breast surgery-benign cyst.  10.Skin cancer   Current medications listed on the chart updated and reviewed.   MEDICATION ALLERGIES:  None known.   SOCIAL HISTORY AND REVIEW OF SYSTEMS:  Per the handwritten form.   PHYSICAL EXAMINATION:  GENERAL:  Well-developed, well-nourished white  female in no acute distress.  VITAL SIGNS:  Height 5 feet 2 inches, weight 168 pounds, blood pressure  is 120/80, pulse 80 and regular.  HEENT:  Anicteric sclerae, oropharynx clear.  CHEST:  Clear to auscultation bilaterally.  CARDIAC:  Regular rate and rhythm without murmurs appreciated.  ABDOMEN:  Soft with minimal left lower quadrant tenderness to deep  palpation.  No rebound or guarding.  No distention.  No palpable  organomegaly, masses or hernias.  Normal active bowel sounds.  RECTAL:  Examination deferred to time of colonoscopy.  EXTREMITIES:  Without clubbing, cyanosis, or edema.  NEUROLOGIC:  Alert and oriented x3.  Grossly nonfocal.   ASSESSMENT AND PLAN:  1. Recently resolved diverticulitis with resolution noted on CT scan.      Persistent left lower quadrant pain of unclear etiology.  Rule  out      colorectal neoplasms, persistent diverticulitis and inflammatory      bowel disease.  She is to complete her current course of      doxycycline.  Begin Florastor one p.o. q.a.m. for 4 weeks.  Risks,      benefits and alternatives to colonoscopy with possible biopsy and      possible polypectomy discussed with the patient and she consents to      proceed.  This will be scheduled electively.  2. Mild gastroesophageal reflux disease.  Standard antireflux measures      and over-the-counter H2-receptor antagonists as needed.     Briana [name] Plan, MD, [facility]  Electronically Signed    MTS/MedQ  DD: 11/30/2006  DT: 11/30/2006  Job #: [id number]   cc:   Unk Pinto, M.D.

## 2011-04-03 NOTE — Assessment & Plan Note (Signed)
[place] OFFICE NOTE   [name], [name]                        MRN:          [id number]  DATE:01/04/2007                            DOB:          11/07/1934    Briana [name] has had substantial resolution of her abdominal pain.  She  has not tried the [place] for the episodes of minimal lower abdominal  discomfort associated with nausea.  She was noting some mucus in her  stool for several days but this has completely resolved.  Her reflux  symptoms are under good control.   Current medications listed on the chart updated and reviewed.   MEDICATION ALLERGIES:  None known.   EXAMINATION:  No acute distress.  Weight 166 pounds.  Blood pressure is 120/80.  CHEST: Clear to auscultation bilaterally.  CARDIAC:  Regular rate and rhythm without murmurs.  ABDOMEN:  Is soft, nontender with normoactive bowel sound.   ASSESSMENT/PLAN:  1. Resolved diverticulitis.  She is recommended to maintain a long      term high fiber diet with adequate fluid intake for management of      diverticulosis.  She may use Levbid 1/2 to 1 whole tablet b.i.d.      p.r.n.  for low abdominal cramping and discomfort.  2. Mild gastroesophageal reflux disease.  Continue ranitidine and      antireflux measures.  3. Ongoing care with Dr. Melford Aase.  I will see her back on referral.     [name]. Fuller Plan, MD, [facility]  Electronically Signed    MTS/MedQ  DD: 01/04/2007  DT: 01/04/2007  Job #: [id number]   cc:   Unk Pinto, M.D.

## 2011-06-03 ENCOUNTER — Other Ambulatory Visit (HOSPITAL_COMMUNITY): Payer: Self-pay | Admitting: Internal Medicine

## 2011-06-03 DIAGNOSIS — Z1231 Encounter for screening mammogram for malignant neoplasm of breast: Secondary | ICD-10-CM

## 2011-07-02 ENCOUNTER — Ambulatory Visit (HOSPITAL_COMMUNITY)
Admission: RE | Admit: 2011-07-02 | Discharge: 2011-07-02 | Disposition: A | Discharge status: Home / Self Care | Payer: Medicare Other | Source: Ambulatory Visit | Attending: Internal Medicine | Admitting: Internal Medicine

## 2011-07-02 DIAGNOSIS — Z1231 Encounter for screening mammogram for malignant neoplasm of breast: Secondary | ICD-10-CM | POA: Insufficient documentation

## 2011-07-16 ENCOUNTER — Other Ambulatory Visit (HOSPITAL_COMMUNITY): Payer: Self-pay | Admitting: Internal Medicine

## 2011-07-16 ENCOUNTER — Ambulatory Visit (HOSPITAL_COMMUNITY)
Admission: RE | Admit: 2011-07-16 | Discharge: 2011-07-16 | Disposition: A | Discharge status: Home / Self Care | Payer: Medicare Other | Source: Ambulatory Visit | Attending: Internal Medicine | Admitting: Internal Medicine

## 2011-07-16 DIAGNOSIS — E119 Type 2 diabetes mellitus without complications: Secondary | ICD-10-CM | POA: Insufficient documentation

## 2011-07-16 DIAGNOSIS — J189 Pneumonia, unspecified organism: Secondary | ICD-10-CM | POA: Insufficient documentation

## 2011-07-16 DIAGNOSIS — R071 Chest pain on breathing: Secondary | ICD-10-CM

## 2011-07-16 DIAGNOSIS — K5900 Constipation, unspecified: Secondary | ICD-10-CM

## 2011-07-16 DIAGNOSIS — R109 Unspecified abdominal pain: Secondary | ICD-10-CM | POA: Insufficient documentation

## 2011-07-28 IMAGING — CR DG CHEST 2V
2 series · 2 of 2 positions shown · non-contrast
Comparison: 03/04/2010.

CLINICAL DATA: Recent productive coughing with green sputum.
History of hypertension.  Preoperative cardiopulmonary evaluation.

CHEST - 2 VIEW

[w chest pa]
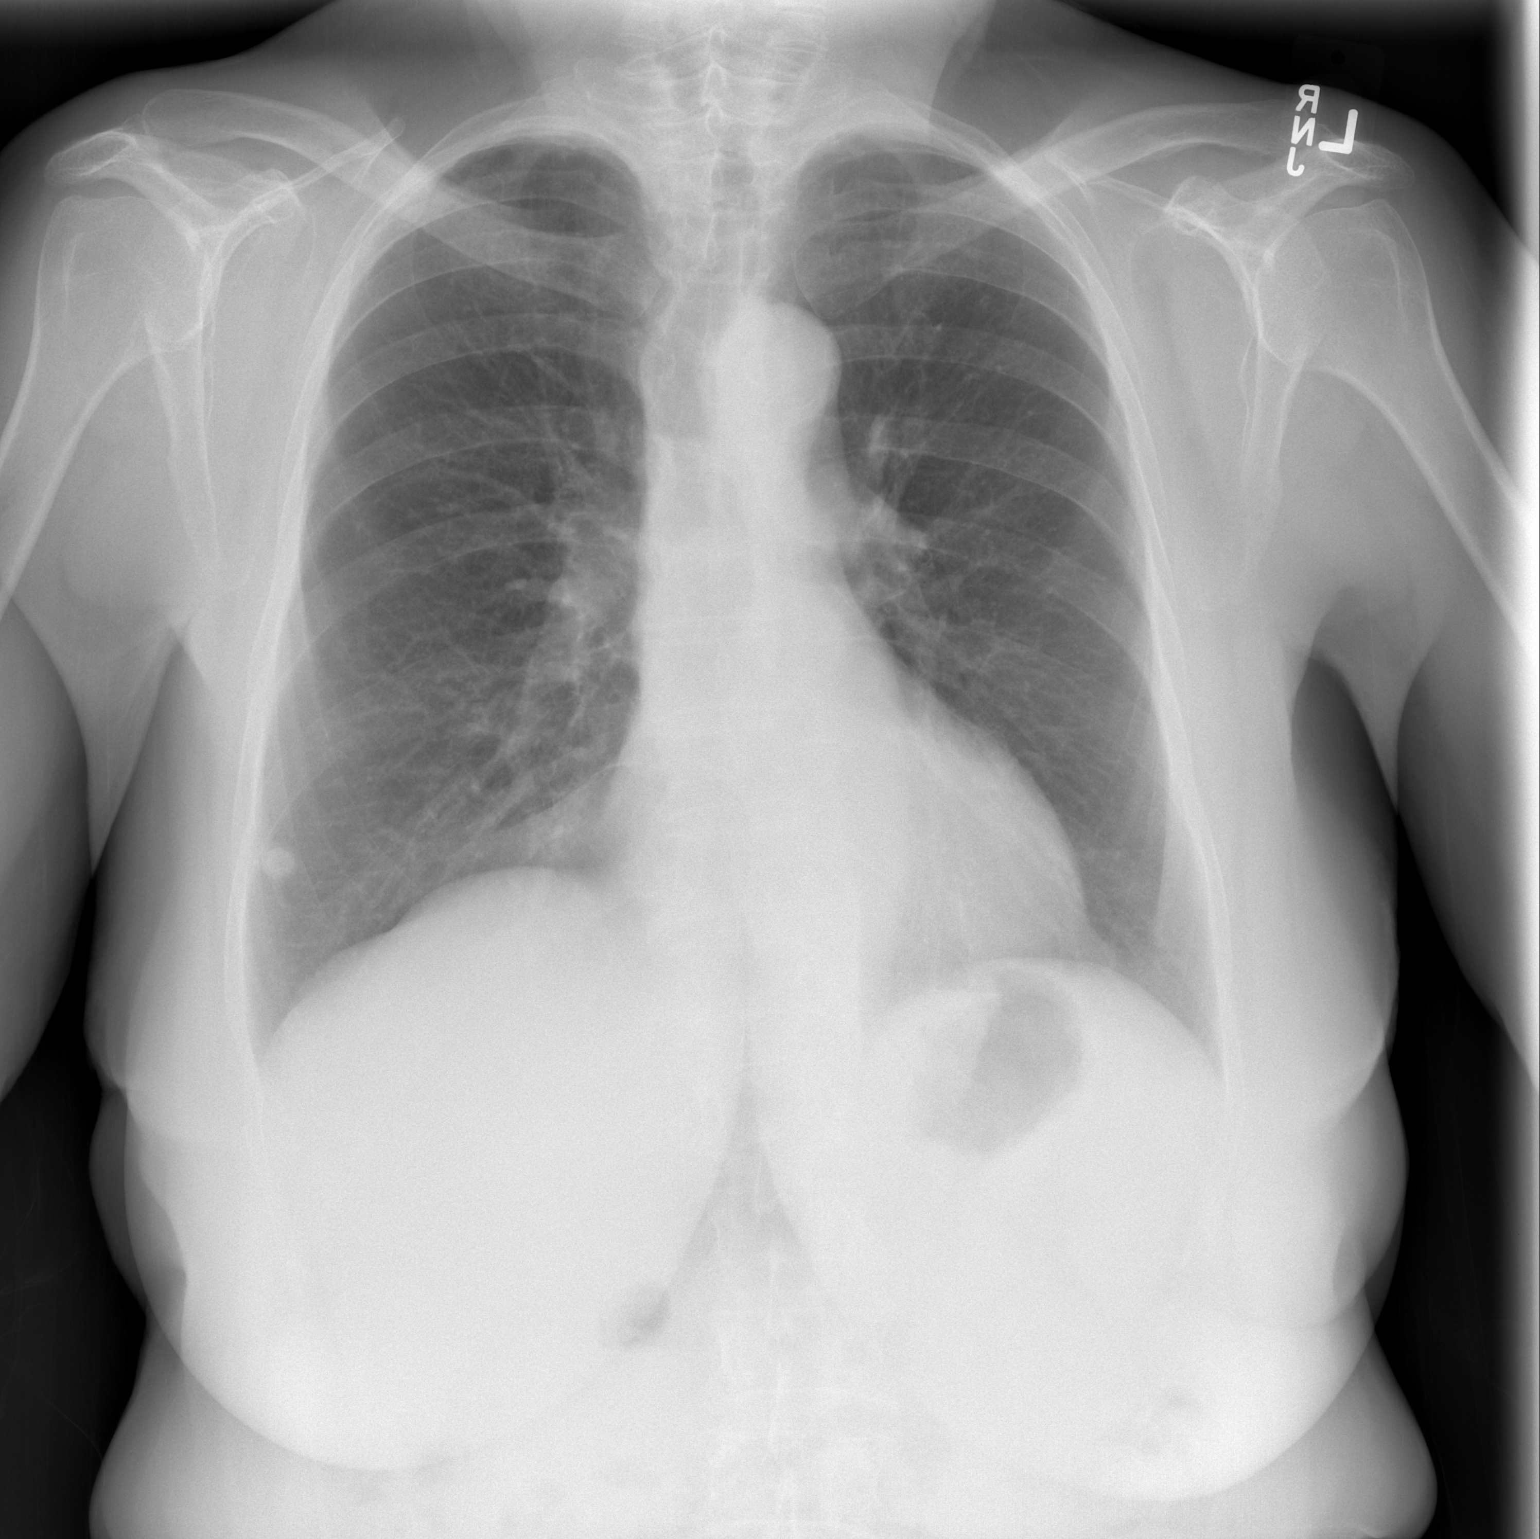

[w chest lat]
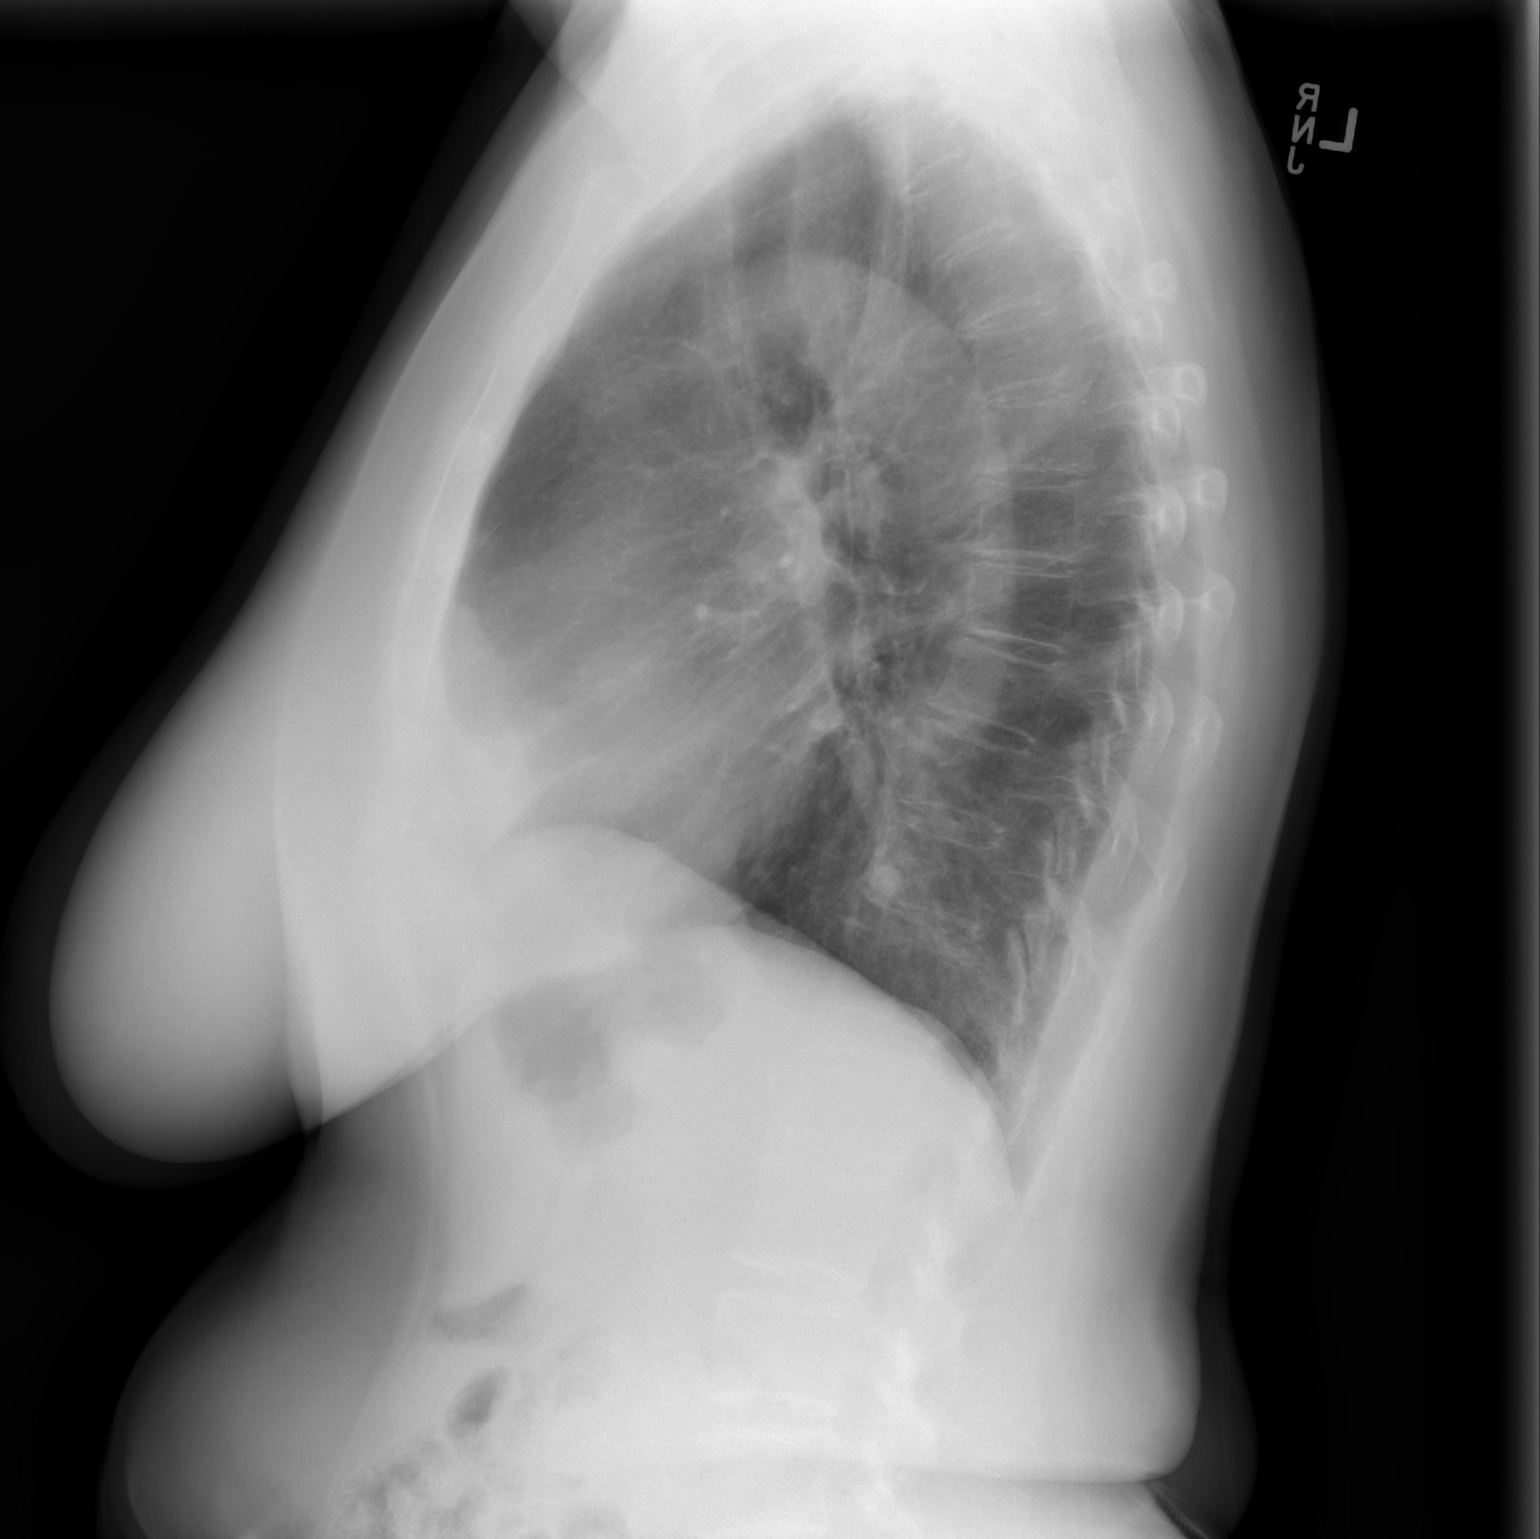

[2 of 2 positions shown; findings below may reference images not displayed]

FINDINGS: The cardiac silhouette is normal size and shape. Ectasia
and nonaneurysmal calcification of the thoracic aorta is seen.
There is a stable appearance of the calcified granuloma in the
right lower lobe.  No active granulomatous process is seen.  No
pulmonary edema, pneumonia, or pleural effusion is seen. There is a
mildly osteopenic appearance of the bones.
IMPRESSION: No acute or active process is identified.  Stable chronic findings
are described above.

## 2011-10-01 ENCOUNTER — Other Ambulatory Visit: Payer: Self-pay | Admitting: Dermatology

## 2012-04-27 ENCOUNTER — Other Ambulatory Visit (HOSPITAL_COMMUNITY): Payer: Self-pay | Admitting: Internal Medicine

## 2012-04-27 DIAGNOSIS — Z1231 Encounter for screening mammogram for malignant neoplasm of breast: Secondary | ICD-10-CM

## 2012-06-29 ENCOUNTER — Other Ambulatory Visit (HOSPITAL_COMMUNITY): Payer: Self-pay | Admitting: Internal Medicine

## 2012-06-29 ENCOUNTER — Ambulatory Visit (HOSPITAL_COMMUNITY)
Admission: RE | Admit: 2012-06-29 | Discharge: 2012-06-29 | Disposition: A | Discharge status: Home / Self Care | Payer: Medicare Other | Source: Ambulatory Visit | Attending: Internal Medicine | Admitting: Internal Medicine

## 2012-06-29 DIAGNOSIS — IMO0002 Reserved for concepts with insufficient information to code with codable children: Secondary | ICD-10-CM

## 2012-06-29 DIAGNOSIS — M5137 Other intervertebral disc degeneration, lumbosacral region: Secondary | ICD-10-CM | POA: Insufficient documentation

## 2012-06-29 DIAGNOSIS — M545 Low back pain, unspecified: Secondary | ICD-10-CM | POA: Insufficient documentation

## 2012-06-29 DIAGNOSIS — M47817 Spondylosis without myelopathy or radiculopathy, lumbosacral region: Secondary | ICD-10-CM | POA: Insufficient documentation

## 2012-06-29 DIAGNOSIS — M51379 Other intervertebral disc degeneration, lumbosacral region without mention of lumbar back pain or lower extremity pain: Secondary | ICD-10-CM | POA: Insufficient documentation

## 2012-07-04 ENCOUNTER — Ambulatory Visit (HOSPITAL_COMMUNITY)
Admission: RE | Admit: 2012-07-04 | Discharge: 2012-07-04 | Disposition: A | Discharge status: Home / Self Care | Payer: Medicare Other | Source: Ambulatory Visit | Attending: Internal Medicine | Admitting: Internal Medicine

## 2012-07-04 DIAGNOSIS — Z1231 Encounter for screening mammogram for malignant neoplasm of breast: Secondary | ICD-10-CM | POA: Insufficient documentation

## 2012-10-21 ENCOUNTER — Other Ambulatory Visit: Payer: Self-pay | Admitting: Dermatology

## 2012-12-12 ENCOUNTER — Emergency Department (HOSPITAL_COMMUNITY)
Admission: EM | Admit: 2012-12-12 | Discharge: 2012-12-12 | Disposition: A | Discharge status: Home / Self Care | Payer: Medicare Other | Attending: Emergency Medicine | Admitting: Emergency Medicine

## 2012-12-12 ENCOUNTER — Encounter (HOSPITAL_COMMUNITY): Payer: Self-pay | Admitting: *Deleted

## 2012-12-12 ENCOUNTER — Emergency Department (HOSPITAL_COMMUNITY): Payer: Medicare Other

## 2012-12-12 DIAGNOSIS — I10 Essential (primary) hypertension: Secondary | ICD-10-CM | POA: Insufficient documentation

## 2012-12-12 DIAGNOSIS — Z8619 Personal history of other infectious and parasitic diseases: Secondary | ICD-10-CM | POA: Insufficient documentation

## 2012-12-12 DIAGNOSIS — R079 Chest pain, unspecified: Secondary | ICD-10-CM | POA: Insufficient documentation

## 2012-12-12 DIAGNOSIS — Z8719 Personal history of other diseases of the digestive system: Secondary | ICD-10-CM | POA: Insufficient documentation

## 2012-12-12 DIAGNOSIS — R1012 Left upper quadrant pain: Secondary | ICD-10-CM | POA: Insufficient documentation

## 2012-12-12 HISTORY — DX: Essential (primary) hypertension: I10

## 2012-12-12 HISTORY — DX: Diverticulitis of intestine, part unspecified, without perforation or abscess without bleeding: K57.92

## 2012-12-12 LAB — POCT I-STAT, CHEM 8
BUN: 12 mg/dL (ref 6–23)
Calcium, Ion: 1.27 mmol/L (ref 1.13–1.30)
Chloride: 104 mEq/L (ref 96–112)
Creatinine, Ser: 1.1 mg/dL (ref 0.50–1.10)
Glucose, Bld: 112 mg/dL — ABNORMAL HIGH (ref 70–99)
HCT: 41 % (ref 36.0–46.0)
Hemoglobin: 13.9 g/dL (ref 12.0–15.0)
Potassium: 3.6 mEq/L (ref 3.5–5.1)
Sodium: 142 mEq/L (ref 135–145)
TCO2: 29 mmol/L (ref 0–100)

## 2012-12-12 LAB — URINALYSIS, ROUTINE W REFLEX MICROSCOPIC
Bilirubin Urine: NEGATIVE
Glucose, UA: NEGATIVE mg/dL
Hgb urine dipstick: NEGATIVE
Ketones, ur: NEGATIVE mg/dL
Leukocytes, UA: NEGATIVE
Nitrite: NEGATIVE
Protein, ur: NEGATIVE mg/dL
Specific Gravity, Urine: 1.008 (ref 1.005–1.030)
Urobilinogen, UA: 0.2 mg/dL (ref 0.0–1.0)
pH: 7.5 (ref 5.0–8.0)

## 2012-12-12 LAB — CBC
HCT: 41.8 % (ref 36.0–46.0)
Hemoglobin: 13.8 g/dL (ref 12.0–15.0)
MCH: 29.7 pg (ref 26.0–34.0)
MCHC: 33 g/dL (ref 30.0–36.0)
MCV: 89.9 fL (ref 78.0–100.0)
Platelets: 205 10*3/uL (ref 150–400)
RBC: 4.65 MIL/uL (ref 3.87–5.11)
RDW: 14.2 % (ref 11.5–15.5)
WBC: 5.3 10*3/uL (ref 4.0–10.5)

## 2012-12-12 LAB — LIPASE, BLOOD: Lipase: 18 U/L (ref 11–59)

## 2012-12-12 LAB — POCT I-STAT TROPONIN I: Troponin i, poc: 0 ng/mL (ref 0.00–0.08)

## 2012-12-12 MED ORDER — MORPHINE SULFATE 4 MG/ML IJ SOLN
4.0000 mg | Freq: Once | INTRAMUSCULAR | Status: AC
  Administered 2012-12-12: 4 mg via INTRAVENOUS
  Filled 2012-12-12: qty 1

## 2012-12-12 MED ORDER — ONDANSETRON HCL 4 MG PO TABS: 4.0000 mg | ORAL_TABLET | Freq: Four times a day (QID) | ORAL | Status: DC

## 2012-12-12 MED ORDER — ONDANSETRON 4 MG PO TBDP
4.0000 mg | ORAL_TABLET | Freq: Once | ORAL | Status: AC
  Administered 2012-12-12: 4 mg via ORAL
  Filled 2012-12-12: qty 1

## 2012-12-12 MED ORDER — ONDANSETRON HCL 4 MG/2ML IJ SOLN: 4.0000 mg | Freq: Once | INTRAMUSCULAR | Status: DC

## 2012-12-12 MED ORDER — ONDANSETRON HCL 4 MG/2ML IJ SOLN
4.0000 mg | Freq: Once | INTRAMUSCULAR | Status: AC
  Administered 2012-12-12: 4 mg via INTRAVENOUS
  Filled 2012-12-12: qty 2

## 2012-12-12 NOTE — ED Provider Notes (Signed)
History     CSN: [id number]  Arrival date & time 12/12/12  1127   First MD Initiated Contact with Patient 12/12/12 1230      Chief Complaint  Patient presents with  . Nausea  . Abdominal Pain    (Consider location/radiation/quality/duration/timing/severity/associated sxs/prior treatment) HPI  79 year old female with prior history of shingle, history of hypertension and history of diverticulitis presents complaining of abdominal pain. Patient reports for the past 2-1/2 weeks she has had intermittent pain to her left upper abdomen underneath her breast. Describe pain as a soreness sensation, mild, sometimes worsening with movement. She also endorsed nausea which has been persistent for the past 2 weeks. Nausea seems to worsen after eating but denies vomiting or diarrhea. The symptom has gradually worsening in which she was recommended to come to the ER for further evaluation.  She reports no fever, chills, productive cough, hemoptysis, chest pain, shortness of breath, dyspnea on exertion, back pain, dysuria, hematochezia, melena, or rash. She has no significant risk factor for PE or DVT including no recent surgery, prolonged bed rest, or long trip. She took Tylenol with some improvement. She also has a history of GERD but sts the symptoms felt different.  Past Medical History  Diagnosis Date  . Hypertension   . Diverticulitis     Past Surgical History  Procedure Date  . Joint replacement   . Thyroid surgery     History reviewed. No pertinent family history.  History  Substance Use Topics  . Smoking status: Never Smoker   . Smokeless tobacco: Not on file  . Alcohol Use: No    OB History    Grav Para Term Preterm Abortions TAB SAB Ect Mult Living                  Review of Systems  Constitutional:       10 Systems reviewed and all are negative for acute change except as noted in the HPI.     Allergies  Review of patient's allergies indicates not on file.  Home  Medications  No current outpatient prescriptions on file.  BP 155/104  Pulse 97  Temp 98.3 F (36.8 C) (Oral)  Resp 16  SpO2 100%  Physical Exam  Nursing note and vitals reviewed. Constitutional: She is oriented to person, place, and time. She appears well-developed and well-nourished. No distress.       Awake, alert, nontoxic appearance  HENT:  Head: Atraumatic.  Eyes: Conjunctivae normal are normal. Right eye exhibits no discharge. Left eye exhibits no discharge.  Neck: Neck supple. No JVD present.  Cardiovascular: Normal rate and regular rhythm.   Pulmonary/Chest: Effort normal. No respiratory distress. She has no wheezes. She has no rales. She exhibits tenderness.       Chaperone present:  Tenderness to lateral aspect of L breast and to chest wall without rash, nodules, overlying skin changes, or crepitus.  Does not follow any specific dermatomal pattern  Abdominal: Soft. There is no tenderness (mild tenderness to LUQ without guarding or rebound tenderness.  No hepatosplenomegaly). There is no rebound.  Musculoskeletal: She exhibits no edema and no tenderness.       ROM appears intact, no obvious focal weakness  Neurological: She is alert and oriented to person, place, and time.       Mental status and motor strength appears intact  Skin: No rash noted.  Psychiatric: She has a normal mood and affect.    ED Course  Procedures (including critical care  time)  Labs Reviewed  POCT I-STAT, CHEM 8 - Abnormal; Notable for the following:    Glucose, Bld 112 (*)     All other components within normal limits  CBC  URINALYSIS, ROUTINE W REFLEX MICROSCOPIC  LIPASE, BLOOD   No results found.   No diagnosis found.  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  ST/T Wave abnormalities: normal    Conduction Disutrbances: Incomplete right bundle branch block.  Narrative Interpretation: Abnormal EKG  Old EKG Reviewed: none available  Results for orders placed during the hospital encounter of 12/12/12  CBC      Component Value Range   WBC 5.3  4.0 - 10.5 K/uL   RBC 4.65  3.87 - 5.11 MIL/uL   Hemoglobin 13.8  12.0 - 15.0 g/dL   HCT 41.8  36.0 - 46.0 %   MCV 89.9  78.0 - 100.0 fL   MCH 29.7  26.0 - 34.0 pg   MCHC 33.0  30.0 - 36.0 g/dL   RDW 14.2  11.5 - 15.5 %   Platelets 205  150 - 400 K/uL  URINALYSIS, ROUTINE W REFLEX MICROSCOPIC      Component Value Range   Color, Urine YELLOW  YELLOW   APPearance CLEAR  CLEAR   Specific Gravity, Urine 1.008  1.005 - 1.030   pH 7.5  5.0 - 8.0   Glucose, UA NEGATIVE  NEGATIVE mg/dL   Hgb urine dipstick NEGATIVE  NEGATIVE   Bilirubin Urine NEGATIVE  NEGATIVE   Ketones, ur NEGATIVE  NEGATIVE mg/dL   Protein, ur NEGATIVE  NEGATIVE mg/dL   Urobilinogen, UA 0.2  0.0 - 1.0 mg/dL   Nitrite NEGATIVE  NEGATIVE   Leukocytes, UA NEGATIVE  NEGATIVE  POCT I-STAT, CHEM 8      Component Value Range   Sodium 142  135 - 145 mEq/L   Potassium 3.6  3.5 - 5.1 mEq/L   Chloride 104  96 - 112 mEq/L   BUN 12  6 - 23 mg/dL   Creatinine, Ser 1.10  0.50 - 1.10 mg/dL   Glucose, Bld 112 (*) 70 - 99 mg/dL   Calcium, Ion 1.27  1.13 - 1.30 mmol/L   TCO2 29  0 - 100 mmol/L   Hemoglobin 13.9  12.0 - 15.0 g/dL   HCT 41.0  36.0 - 46.0 %  LIPASE, BLOOD      Component Value Range   Lipase 18  11 - 59 U/L  POCT I-STAT TROPONIN I      Component Value Range   Troponin i, poc 0.00  0.00 - 0.08 ng/mL   Comment 3            Dg Chest 2 View  12/12/2012  *RADIOLOGY REPORT*  Clinical Data: Chest pain.  CHEST - 2 VIEW  Comparison: 07/16/2011.  Findings: The cardiac silhouette, mediastinal and hilar contours are normal and stable.  The lungs are clear of acute process. Minimal scarring changes and right lower lobe calcified granuloma.  IMPRESSION: Chronic lung changes but  no acute pulmonary findings.   Original Report Authenticated By: Marijo Sanes, M.D.       1:08 PM LUQ tenderness and tenderness to lateral aspect of L breast.  Abdomen non surgical.  No evidence of rash.  Low suspicion for cardiopulmonary disease.  Doubt shingle  2:55 PM Patient's EKG shows no acute ischemic change. Troponin is negative. Her electrolytes are within normal limits. Normal lipase. UA is unremarkable. Chest x-ray shows no acute changes concerning for infection. Patient endorse tenderness she has no evidence of shingle or trauma to suggest rib fractures. Abdomen is nonsurgical. She endorse nausea, ongoing for 2-3 weeks but able to by mouth. Care discussed with my attending who has seen and evaluate patient and felt that the patient is stable to be discharged. Patient agrees to followup with her PCP for further evaluation.  MDM  BP 154/81  Pulse 74  Temp 98.3 F (36.8 C) (Oral)  Resp 16  SpO2 96%  I have reviewed nursing notes and vital signs. I personally reviewed the imaging tests through PACS system  I reviewed available ER/hospitalization records thought the EMR         Domenic Moras, PA-C 12/12/12 1459

## 2012-12-12 NOTE — ED Notes (Signed)
Pt unable to give urine sample in triage

## 2012-12-12 NOTE — ED Provider Notes (Signed)
Medical screening examination/treatment/procedure(s) were conducted as a shared visit with non-physician practitioner(s) and myself.  I personally evaluated the patient during the encounter 79 yo woman with pain in the left side of the abdomen on and off for the past 2 weeks.  Exam shows no abdominal mass or tenderness, and lab workup is reassuringly negative.  Reassured and released.  Mylinda Latina III, MD 12/12/12 2027

## 2012-12-12 NOTE — ED Notes (Signed)
To ED for eval of left upper abd pain/under breast pain for the past cple of weeks. Pt states she stay nauseated which increases after eating.

## 2012-12-12 NOTE — ED Provider Notes (Signed)
1:00 PM  Date: 12/12/2012  Rate: 84  Rhythm: normal sinus rhythm  QRS Axis: right  Intervals: normal QRS:  Low voltage in frontal leads;Poor R wave progression in precordial leads suggests old anterior myocardial infarction.  ST/T Wave abnormalities: normal  Conduction Disutrbances:  Incomplete right bundle branch block.  Narrative Interpretation: Abnormal EKG  Old EKG Reviewed: none available    Mylinda Latina III, MD 12/12/12 1302

## 2013-05-30 ENCOUNTER — Other Ambulatory Visit (HOSPITAL_COMMUNITY): Payer: Self-pay | Admitting: Internal Medicine

## 2013-05-30 DIAGNOSIS — Z1231 Encounter for screening mammogram for malignant neoplasm of breast: Secondary | ICD-10-CM

## 2013-06-14 ENCOUNTER — Other Ambulatory Visit: Payer: Self-pay | Admitting: Dermatology

## 2013-07-05 ENCOUNTER — Ambulatory Visit (HOSPITAL_COMMUNITY)
Admission: RE | Admit: 2013-07-05 | Discharge: 2013-07-05 | Disposition: A | Discharge status: Home / Self Care | Payer: Medicare Other | Source: Ambulatory Visit | Attending: Internal Medicine | Admitting: Internal Medicine

## 2013-07-05 DIAGNOSIS — Z1231 Encounter for screening mammogram for malignant neoplasm of breast: Secondary | ICD-10-CM | POA: Insufficient documentation

## 2013-11-16 ENCOUNTER — Encounter: Payer: Self-pay | Admitting: Internal Medicine

## 2013-11-19 DIAGNOSIS — R7303 Prediabetes: Secondary | ICD-10-CM

## 2013-11-19 DIAGNOSIS — E785 Hyperlipidemia, unspecified: Secondary | ICD-10-CM | POA: Insufficient documentation

## 2013-11-19 DIAGNOSIS — E1169 Type 2 diabetes mellitus with other specified complication: Secondary | ICD-10-CM | POA: Insufficient documentation

## 2013-11-19 DIAGNOSIS — R7309 Other abnormal glucose: Secondary | ICD-10-CM | POA: Insufficient documentation

## 2013-11-19 DIAGNOSIS — N6019 Diffuse cystic mastopathy of unspecified breast: Secondary | ICD-10-CM | POA: Insufficient documentation

## 2013-11-19 DIAGNOSIS — E782 Mixed hyperlipidemia: Secondary | ICD-10-CM | POA: Insufficient documentation

## 2013-11-19 DIAGNOSIS — I10 Essential (primary) hypertension: Secondary | ICD-10-CM | POA: Insufficient documentation

## 2013-11-19 DIAGNOSIS — E559 Vitamin D deficiency, unspecified: Secondary | ICD-10-CM | POA: Insufficient documentation

## 2013-11-20 ENCOUNTER — Ambulatory Visit (INDEPENDENT_AMBULATORY_CARE_PROVIDER_SITE_OTHER): Payer: Medicare Other | Admitting: Internal Medicine

## 2013-11-20 ENCOUNTER — Encounter: Payer: Self-pay | Admitting: Internal Medicine

## 2013-11-20 VITALS — BP 136/88 | HR 72 | Temp 98.8°F | Resp 18 | Ht 61.5 in | Wt 151.8 lb

## 2013-11-20 DIAGNOSIS — K2100 Gastro-esophageal reflux disease with esophagitis, without bleeding: Secondary | ICD-10-CM

## 2013-11-20 DIAGNOSIS — Z0000 Encounter for general adult medical examination without abnormal findings: Secondary | ICD-10-CM

## 2013-11-20 DIAGNOSIS — Z1212 Encounter for screening for malignant neoplasm of rectum: Secondary | ICD-10-CM

## 2013-11-20 DIAGNOSIS — Z111 Encounter for screening for respiratory tuberculosis: Secondary | ICD-10-CM

## 2013-11-20 DIAGNOSIS — M8580 Other specified disorders of bone density and structure, unspecified site: Secondary | ICD-10-CM | POA: Insufficient documentation

## 2013-11-20 DIAGNOSIS — Z79899 Other long term (current) drug therapy: Secondary | ICD-10-CM

## 2013-11-20 DIAGNOSIS — M810 Age-related osteoporosis without current pathological fracture: Secondary | ICD-10-CM

## 2013-11-20 DIAGNOSIS — I10 Essential (primary) hypertension: Secondary | ICD-10-CM

## 2013-11-20 DIAGNOSIS — R7309 Other abnormal glucose: Secondary | ICD-10-CM

## 2013-11-20 DIAGNOSIS — E782 Mixed hyperlipidemia: Secondary | ICD-10-CM

## 2013-11-20 DIAGNOSIS — E559 Vitamin D deficiency, unspecified: Secondary | ICD-10-CM

## 2013-11-20 DIAGNOSIS — G250 Essential tremor: Secondary | ICD-10-CM

## 2013-11-20 LAB — BASIC METABOLIC PANEL WITH GFR
BUN: 15 mg/dL (ref 6–23)
CO2: 29 mEq/L (ref 19–32)
Calcium: 9.5 mg/dL (ref 8.4–10.5)
Chloride: 103 mEq/L (ref 96–112)
Creat: 0.87 mg/dL (ref 0.50–1.10)
GFR, Est African American: 74 mL/min
GFR, Est Non African American: 64 mL/min
Glucose, Bld: 98 mg/dL (ref 70–99)
Potassium: 3.9 mEq/L (ref 3.5–5.3)
Sodium: 140 mEq/L (ref 135–145)

## 2013-11-20 LAB — CBC WITH DIFFERENTIAL/PLATELET
Basophils Absolute: 0 10*3/uL (ref 0.0–0.1)
Basophils Relative: 1 % (ref 0–1)
Eosinophils Absolute: 0.1 10*3/uL (ref 0.0–0.7)
Eosinophils Relative: 2 % (ref 0–5)
HCT: 40.8 % (ref 36.0–46.0)
Hemoglobin: 13.7 g/dL (ref 12.0–15.0)
Lymphocytes Relative: 29 % (ref 12–46)
Lymphs Abs: 1.7 10*3/uL (ref 0.7–4.0)
MCH: 29.9 pg (ref 26.0–34.0)
MCHC: 33.6 g/dL (ref 30.0–36.0)
MCV: 89.1 fL (ref 78.0–100.0)
Monocytes Absolute: 0.6 10*3/uL (ref 0.1–1.0)
Monocytes Relative: 9 % (ref 3–12)
Neutro Abs: 3.6 10*3/uL (ref 1.7–7.7)
Neutrophils Relative %: 59 % (ref 43–77)
Platelets: 208 10*3/uL (ref 150–400)
RBC: 4.58 MIL/uL (ref 3.87–5.11)
RDW: 14.8 % (ref 11.5–15.5)
WBC: 6 10*3/uL (ref 4.0–10.5)

## 2013-11-20 LAB — LIPID PANEL
Cholesterol: 181 mg/dL (ref 0–200)
HDL: 67 mg/dL (ref >39)
LDL Cholesterol: 99 mg/dL (ref 0–99)
Total CHOL/HDL Ratio: 2.7 Ratio
Triglycerides: 77 mg/dL (ref <150)
VLDL: 15 mg/dL (ref 0–40)

## 2013-11-20 LAB — TSH: TSH: 0.816 u[IU]/mL (ref 0.350–4.500)

## 2013-11-20 LAB — HEMOGLOBIN A1C
Hgb A1c MFr Bld: 6.3 % — ABNORMAL HIGH (ref <5.7)
Mean Plasma Glucose: 134 mg/dL — ABNORMAL HIGH (ref <117)

## 2013-11-20 LAB — HEPATIC FUNCTION PANEL
ALT: 15 U/L (ref 0–35)
AST: 21 U/L (ref 0–37)
Albumin: 4.4 g/dL (ref 3.5–5.2)
Alkaline Phosphatase: 58 U/L (ref 39–117)
Bilirubin, Direct: 0.1 mg/dL (ref 0.0–0.3)
Indirect Bilirubin: 0.4 mg/dL (ref 0.0–0.9)
Total Bilirubin: 0.5 mg/dL (ref 0.3–1.2)
Total Protein: 6.5 g/dL (ref 6.0–8.3)

## 2013-11-20 LAB — MAGNESIUM: Magnesium: 2 mg/dL (ref 1.5–2.5)

## 2013-11-20 MED ORDER — PROPRANOLOL HCL ER 80 MG PO CP24: 80.0000 mg | ORAL_CAPSULE | Freq: Every day | ORAL | Status: DC

## 2013-11-20 NOTE — Patient Instructions (Addendum)
Continue diet & medications same as discussed.   Further disposition pending lab results.     Hypertension As your heart beats, it forces blood through your arteries. This force is your blood pressure. If the pressure is too high, it is called hypertension (HTN) or high blood pressure. HTN is dangerous because you may have it and not know it. High blood pressure may mean that your heart has to work harder to pump blood. Your arteries may be narrow or stiff. The extra work puts you at risk for heart disease, stroke, and other problems.  Blood pressure consists of two numbers, a higher number over a lower, 110/72, for example. It is stated as "110 over 72." The ideal is below 120 for the top number (systolic) and under 80 for the bottom (diastolic). Write down your blood pressure today. You should pay close attention to your blood pressure if you have certain conditions such as:  Heart failure.  Prior heart attack.  Diabetes  Chronic kidney disease.  Prior stroke.  Multiple risk factors for heart disease. To see if you have HTN, your blood pressure should be measured while you are seated with your arm held at the level of the heart. It should be measured at least twice. A one-time elevated blood pressure reading (especially in the Emergency Department) does not mean that you need treatment. There may be conditions in which the blood pressure is different between your right and left arms. It is important to see your caregiver soon for a recheck. Most people have essential hypertension which means that there is not a specific cause. This type of high blood pressure may be lowered by changing lifestyle factors such as:  Stress.  Smoking.  Lack of exercise.  Excessive weight.  Drug/tobacco/alcohol use.  Eating less salt. Most people do not have symptoms from high blood pressure until it has caused damage to the body. Effective treatment can often prevent, delay or reduce that  damage. TREATMENT  When a cause has been identified, treatment for high blood pressure is directed at the cause. There are a large number of medications to treat HTN. These fall into several categories, and your caregiver will help you select the medicines that are best for you. Medications may have side effects. You should review side effects with your caregiver. If your blood pressure stays high after you have made lifestyle changes or started on medicines,   Your medication(s) may need to be changed.  Other problems may need to be addressed.  Be certain you understand your prescriptions, and know how and when to take your medicine.  Be sure to follow up with your caregiver within the time frame advised (usually within two weeks) to have your blood pressure rechecked and to review your medications.  If you are taking more than one medicine to lower your blood pressure, make sure you know how and at what times they should be taken. Taking two medicines at the same time can result in blood pressure that is too low. SEEK IMMEDIATE MEDICAL CARE IF:  You develop a severe headache, blurred or changing vision, or confusion.  You have unusual weakness or numbness, or a faint feeling.  You have severe chest or abdominal pain, vomiting, or breathing problems. MAKE SURE YOU:   Understand these instructions.  Will watch your condition.  Will get help right away if you are not doing well or get worse. Document Released: 11/02/2005 Document Revised: 01/25/2012 Document Reviewed: 06/22/2008 [employer] Patient Information 2014  [employer].  Diabetes and Exercise Exercising regularly is important. It is not just about losing weight. It has many health benefits, such as:  Improving your overall fitness, flexibility, and endurance.  Increasing your bone density.  Helping with weight control.  Decreasing your body fat.  Increasing your muscle strength.  Reducing stress and  tension.  Improving your overall health. People with diabetes who exercise gain additional benefits because exercise:  Reduces appetite.  Improves the body's use of blood sugar (glucose).  Helps lower or control blood glucose.  Decreases blood pressure.  Helps control blood lipids (such as cholesterol and triglycerides).  Improves the body's use of the hormone insulin by:  Increasing the body's insulin sensitivity.  Reducing the body's insulin needs.  Decreases the risk for heart disease because exercising:  Lowers cholesterol and triglycerides levels.  Increases the levels of good cholesterol (such as high-density lipoproteins [HDL]) in the body.  Lowers blood glucose levels. YOUR ACTIVITY PLAN  Choose an activity that you enjoy and set realistic goals. Your health care provider or diabetes educator can help you make an activity plan that works for you. You can break activities into 2 or 3 sessions throughout the day. Doing so is as good as one long session. Exercise ideas include:  Taking the dog for a walk.  Taking the stairs instead of the elevator.  Dancing to your favorite song.  Doing your favorite exercise with a friend. RECOMMENDATIONS FOR EXERCISING WITH TYPE 1 OR TYPE 2 DIABETES   Check your blood glucose before exercising. If blood glucose levels are greater than 240 mg/dL, check for urine ketones. Do not exercise if ketones are present.  Avoid injecting insulin into areas of the body that are going to be exercised. For example, avoid injecting insulin into:  The arms when playing tennis.  The legs when jogging.  Keep a record of:  Food intake before and after you exercise.  Expected peak times of insulin action.  Blood glucose levels before and after you exercise.  The type and amount of exercise you have done.  Review your records with your health care provider. Your health care provider will help you to develop guidelines for adjusting food  intake and insulin amounts before and after exercising.  If you take insulin or oral hypoglycemic agents, watch for signs and symptoms of hypoglycemia. They include:  Dizziness.  Shaking.  Sweating.  Chills.  Confusion.  Drink plenty of water while you exercise to prevent dehydration or heat stroke. Body water is lost during exercise and must be replaced.  Talk to your health care provider before starting an exercise program to make sure it is safe for you. Remember, almost any type of activity is better than none. Document Released: 01/23/2004 Document Revised: 07/05/2013 Document Reviewed: 04/11/2013 [employer] Patient Information 2014 [employer].  Cholesterol Cholesterol is a white, waxy, fat-like protein needed by your body in small amounts. The liver makes all the cholesterol you need. It is carried from the liver by the blood through the blood vessels. Deposits (plaque) may build up on blood vessel walls. This makes the arteries narrower and stiffer. Plaque increases the risk for heart attack and stroke. You cannot feel your cholesterol level even if it is very high. The only way to know is by a blood test to check your lipid (fats) levels. Once you know your cholesterol levels, you should keep a record of the test results. Work with your caregiver to to keep your levels in   the desired range. WHAT THE RESULTS MEAN:  Total cholesterol is a rough measure of all the cholesterol in your blood.  LDL is the so-called bad cholesterol. This is the type that deposits cholesterol in the walls of the arteries. You want this level to be low.  HDL is the good cholesterol because it cleans the arteries and carries the LDL away. You want this level to be high.  Triglycerides are fat that the body can either burn for energy or store. High levels are closely linked to heart disease. DESIRED LEVELS:  Total cholesterol below 200.  LDL below 100 for people at risk, below 70 for very high  risk.  HDL above 50 is good, above 60 is best.  Triglycerides below 150. HOW TO LOWER YOUR CHOLESTEROL:  Diet.  Choose fish or white meat chicken and [place], roasted or baked. Limit fatty cuts of red meat, fried foods, and processed meats, such as sausage and lunch meat.  Eat lots of fresh fruits and vegetables. Choose whole grains, beans, pasta, potatoes and cereals.  Use only small amounts of olive, corn or canola oils. Avoid butter, mayonnaise, shortening or palm kernel oils. Avoid foods with trans-fats.  Use skim/nonfat milk and low-fat/nonfat yogurt and cheeses. Avoid whole milk, cream, ice cream, egg yolks and cheeses. Healthy desserts include angel food cake, ginger snaps, animal crackers, hard candy, popsicles, and low-fat/nonfat frozen yogurt. Avoid pastries, cakes, pies and cookies.  Exercise.  A regular program helps decrease LDL and raises HDL.  Helps with weight control.  Do things that increase your activity level like gardening, walking, or taking the stairs.  Medication.  May be prescribed by your caregiver to help lowering cholesterol and the risk for heart disease.  You may need medicine even if your levels are normal if you have several risk factors. HOME CARE INSTRUCTIONS   Follow your diet and exercise programs as suggested by your caregiver.  Take medications as directed.  Have blood work done when your caregiver feels it is necessary. MAKE SURE YOU:   Understand these instructions.  Will watch your condition.  Will get help right away if you are not doing well or get worse. Document Released: 07/28/2001 Document Revised: 01/25/2012 Document Reviewed: 01/18/2008 [facility] Patient Information 2014 [place], [place].  Vitamin D Deficiency Vitamin D is an important vitamin that your body needs. Having too little of it in your body is called a deficiency. A very bad deficiency can make your bones soft and can cause a condition called rickets.  Vitamin D  is important to your body for different reasons, such as:   It helps your body absorb 2 minerals called calcium and phosphorus.  It helps make your bones healthy.  It may prevent some diseases, such as diabetes and multiple sclerosis.  It helps your muscles and heart. You can get vitamin D in several ways. It is a natural part of some foods. The vitamin is also added to some dairy products and cereals. Some people take vitamin D supplements. Also, your body makes vitamin D when you are in the sun. It changes the sun's rays into a form of the vitamin that your body can use. CAUSES   Not eating enough foods that contain vitamin D.  Not getting enough sunlight.  Having certain digestive system diseases that make it hard to absorb vitamin D. These diseases include Crohn's disease, chronic pancreatitis, and cystic fibrosis.  Having a surgery in which part of the stomach or small intestine is  removed.  Being obese. Fat cells pull vitamin D out of your blood. That means that obese people may not have enough vitamin D left in their blood and in other body tissues.  Having chronic kidney or liver disease. RISK FACTORS Risk factors are things that make you more likely to develop a vitamin D deficiency. They include:  Being older.  Not being able to get outside very much.  Living in a nursing home.  Having had broken bones.  Having weak or thin bones (osteoporosis).  Having a disease or condition that changes how your body absorbs vitamin D.  Having dark skin.  Some medicines such as seizure medicines or steroids.  Being overweight or obese. SYMPTOMS Mild cases of vitamin D deficiency may not have any symptoms. If you have a very bad case, symptoms may include:  Bone pain.  Muscle pain.  Falling often.  Broken bones caused by a minor injury, due to osteoporosis. DIAGNOSIS A blood test is the best way to tell if you have a vitamin D deficiency. TREATMENT Vitamin D  deficiency can be treated in different ways. Treatment for vitamin D deficiency depends on what is causing it. Options include:  Taking vitamin D supplements.  Taking a calcium supplement. Your caregiver will suggest what dose is best for you. HOME CARE INSTRUCTIONS  Take any supplements that your caregiver prescribes. Follow the directions carefully. Take only the suggested amount.  Have your blood tested 2 months after you start taking supplements.  Eat foods that contain vitamin D. Healthy choices include:  Fortified dairy products, cereals, or juices. Fortified means vitamin D has been added to the food. Check the label on the package to be sure.  Fatty fish like salmon or trout.  Eggs.  Oysters.  Do not use a tanning bed.  Keep your weight at a healthy level. Lose weight if you need to.  Keep all follow-up appointments. Your caregiver will need to perform blood tests to make sure your vitamin D deficiency is going away. SEEK MEDICAL CARE IF:  You have any questions about your treatment.  You continue to have symptoms of vitamin D deficiency.  You have nausea or vomiting.  You are constipated.  You feel confused.  You have severe abdominal or back pain. MAKE SURE YOU:  Understand these instructions.  Will watch your condition.  Will get help right away if you are not doing well or get worse. Document Released: 01/25/2012 Document Revised: 02/27/2013 Document Reviewed: 01/25/2012 [facility] Patient Information 2014 [place]. Tremor Tremor is a rhythmic, involuntary muscular contraction characterized by oscillations (to-and-fro movements) of a part of the body. The most common of all involuntary movements, tremor can affect various body parts such as the hands, head, facial structures, vocal cords, trunk, and legs; most tremors, however, occur in the hands. Tremor often accompanies neurological disorders associated with aging. Although the disorder is not  life-threatening, it can be responsible for functional disability and social embarrassment. TREATMENT  There are many types of tremor and several ways in which tremor is classified. The most common classification is by behavioral context or position. There are five categories of tremor within this classification: resting, postural, kinetic, task-specific, and psychogenic. Resting or static tremor occurs when the muscle is at rest, for example when the hands are lying on the lap. This type of tremor is often seen in patients with Parkinson's disease. Postural tremor occurs when a patient attempts to maintain posture, such as holding the hands outstretched. Postural  tremors include physiological tremor, essential tremor, tremor with basal ganglia disease (also seen in patients with Parkinson's disease), cerebellar postural tremor, tremor with peripheral neuropathy, post-traumatic tremor, and alcoholic tremor. Kinetic or intention (action) tremor occurs during purposeful movement, for example during finger-to-nose testing. Task-specific tremor appears when performing goal-oriented tasks such as handwriting, speaking, or standing. This group consists of primary writing tremor, vocal tremor, and orthostatic tremor. Psychogenic tremor occurs in both older and younger patients. The key feature of this tremor is that it dramatically lessens or disappears when the patient is distracted. PROGNOSIS There are some treatment options available for tremor; the appropriate treatment depends on accurate diagnosis of the cause. Some tremors respond to treatment of the underlying condition, for example in some cases of psychogenic tremor treating the patient's underlying mental problem may cause the tremor to disappear. Also, patients with tremor due to Parkinson's disease may be treated with Levodopa drug therapy. Symptomatic drug therapy is available for several other tremors as well. For those cases of tremor in which there is  no effective drug treatment, physical measures such as teaching the patient to brace the affected limb during the tremor are sometimes useful. Surgical intervention such as thalamotomy or deep brain stimulation may be useful in certain cases. Document Released: 10/23/2002 Document Revised: 01/25/2012 Document Reviewed: 11/02/2005 [facility] Patient Information 2014 [place].

## 2013-11-20 NOTE — Progress Notes (Signed)
Patient ID: Briana [name], female   DOB: June 14, 1934, 80 y.o.   MRN: [id number]  Annual Screening Comprehensive Examination  This very nice 80 y.o. WWF presents for complete physical.  Patient has been followed for HTN, Prediabetes, Hyperlipidemia, GERD and Vitamin D Deficiency.    HTN predates since 1996. Patient's BP has been controlled at home. Today's BP: 136/88 mmHg. Patient denies any cardiac symptoms as chest pain, palpitations, shortness of breath, dizziness or ankle swelling.   Patient's hyperlipidemia is controlled with diet and medications. Patient denies myalgias or other medication SE's. Last cholesterol last visit was  197, triglycerides  88, HDL 68 and LDL 111 - near goal.     Patient has prediabetes predating since  Apr 2011 with A1c 6.6% with last A1c6.0% in Oct 2014. Patient denies reactive hypoglycemic symptoms, visual blurring, diabetic polys, or paresthesias.    Patient also reports today a fine tremor of her Rt hand noted worst when eating and holding food utensils.   Finally, patient has history of Vitamin D Deficiency of 23 in 2008 with last vitamin D 103 in Oct.    Medication Sig Dispense Refill  . aspirin 81 MG tablet Take 81 mg by mouth daily.      Marland Kitchen b complex vitamins tablet Take 1 tablet by mouth daily.      . bumetanide (BUMEX) 1 MG tablet Take 0.5 mg by mouth 2 (two) times daily as needed. For swelling      . Cholecalciferol (VITAMIN D3) 2000 UNITS TABS Take 2,000-4,000 Units by mouth 2 (two) times daily. Take 4000 units in the morning and 2000 units in the evening      . ranitidine (ZANTAC) 300 MG tablet Take 300 mg by mouth 2 (two) times daily.        Allergies  Allergen Reactions  . Accupril [Quinapril Hcl] Cough  . Reglan [Metoclopramide] Other (See Comments)    tremors  . Adhesive [Tape] Rash    Past Medical History  Diagnosis Date  . Hypertension   . Diverticulitis   . Hyperlipidemia   . Pre-diabetes   . Vitamin D deficiency   . DJD  (degenerative joint disease)   . DDD (degenerative disc disease)     Past Surgical History  Procedure Laterality Date  . Joint replacement    . Thyroid surgery      Family History  Problem Relation Age of Onset  . Hypertension Mother   . Hyperlipidemia Mother   . Heart disease Sister   . Cancer Brother     prostate    History  Substance Use Topics  . Smoking status: Never Smoker   . Smokeless tobacco: Not on file  . Alcohol Use: Yes     Comment: occasional beer    ROS Constitutional: Denies fever, chills, weight loss/gain, headaches, insomnia, fatigue, night sweats, and change in appetite. Eyes: Denies redness, blurred vision, diplopia, discharge, itchy, watery eyes.  ENT: Denies discharge, congestion, post nasal drip, epistaxis, sore throat, earache, hearing loss, dental pain, Tinnitus, Vertigo, Sinus pain, snoring.  Cardio: Denies chest pain, palpitations, irregular heartbeat, syncope, dyspnea, diaphoresis, orthopnea, PND, claudication, edema Respiratory: denies cough, dyspnea, DOE, pleurisy, hoarseness, laryngitis, wheezing.  Gastrointestinal: Denies dysphagia, heartburn, reflux, water brash, pain, cramps, nausea, vomiting, bloating, diarrhea, constipation, hematemesis, melena, hematochezia, jaundice, hemorrhoids Genitourinary: Denies dysuria, frequency, urgency, nocturia, hesitancy, discharge, hematuria, flank pain Breast:Breast lumps, nipple discharge, bleeding.  Musculoskeletal: Denies arthralgia, myalgia, stiffness, Jt. Swelling, pain, limp, and strain/sprain. Skin: Denies puritis, rash, hives,  warts, acne, eczema, changing in skin lesion Neuro: No weakness, tremor, incoordination, spasms, paresthesia, pain Psychiatric: Denies confusion, memory loss, sensory loss Endocrine: Denies change in weight, skin, hair change, nocturia, and paresthesia, diabetic polys, visual blurring, hyper / hypo glycemic episodes.  Heme/Lymph: No excessive bleeding, bruising, enlarged lymph  nodes.  BP: 136/88  Pulse: 72  Temp: 98.8 F (37.1 C)  Resp: 18    Estimated body mass index is 28.22 kg/(m^2) as calculated from the following:   Height as of this encounter: 5' 1.5" (1.562 m).   Weight as of this encounter: 151 lb 12.8 oz (68.856 kg).  Physical Exam General Appearance: Well nourished, in no apparent distress. Eyes: PERRLA, EOMs, conjunctiva no swelling or erythema, normal fundi and vessels. Sinuses: No frontal/maxillary tenderness ENT/Mouth: EACs patent / TMs  nl. Nares clear without erythema, swelling, mucoid exudates. Oral hygiene is good. No erythema, swelling, or exudate. Tongue normal, non-obstructing. Tonsils not swollen or erythematous. Hearing normal.  Neck: Supple, thyroid normal. No bruits, nodes or JVD. Respiratory: Respiratory effort normal.  BS equal and clear bilateral without rales, rhonci, wheezing or stridor. Cardio: Heart sounds are normal with regular rate and rhythm and no murmurs, rubs or gallops. Peripheral pulses are normal and equal bilaterally without edema. No aortic or femoral bruits. Chest: symmetric with normal excursions and percussion. Breasts: Symmetric, without lumps, nipple discharge, retractions, or fibrocystic changes.  Abdomen: Flat, soft, with bowl sounds. Nontender, no guarding, rebound, hernias, masses, or organomegaly.  Lymphatics: Non tender without lymphadenopathy.  Genitourinary:  Musculoskeletal: Full ROM all peripheral extremities, joint stability, 5/5 strength, and normal gait. Skin: Warm and dry without rashes, lesions, cyanosis, clubbing or  ecchymosis.  Neuro: Cranial nerves intact, reflexes equal bilaterally. Normal muscle tone, no cerebellar symptoms. Sensation intact. Note fine high frequency and low amplitude tremor of the right hand with action. Pysch: Awake and oriented X 3, normal affect, Insight and Judgment appropriate.   Assessment and Plan  1. Annual Screening Examination 2. Hypertension  3.  Hyperlipidemia 4. Pre Diabetes 5. Vitamin D Deficiency 6. GERD 7. Osteoporosis 8. Essential or Familial Tremor - Will try Rx of low dose Propranolol 80 mg LA and recheck in 1 month - will defer trial with Diazepam for now.  Continue prudent diet as discussed, weight control, BP monitoring, regular exercise, and medications. Discussed med's effects and SE's. Screening labs and tests as requested with regular follow-up as recommended.

## 2013-11-21 ENCOUNTER — Telehealth: Payer: Self-pay | Admitting: *Deleted

## 2013-11-21 LAB — URINALYSIS, MICROSCOPIC ONLY
Bacteria, UA: NONE SEEN
Casts: NONE SEEN
Crystals: NONE SEEN

## 2013-11-21 LAB — MICROALBUMIN / CREATININE URINE RATIO
Creatinine, Urine: 68.6 mg/dL
Microalb Creat Ratio: 15 mg/g (ref 0.0–30.0)
Microalb, Ur: 1.03 mg/dL (ref 0.00–1.89)

## 2013-11-21 LAB — INSULIN, FASTING: Insulin fasting, serum: 7 u[IU]/mL (ref 3–28)

## 2013-11-21 LAB — VITAMIN D 25 HYDROXY (VIT D DEFICIENCY, FRACTURES): Vit D, 25-Hydroxy: 92 ng/mL — ABNORMAL HIGH (ref 30–89)

## 2013-11-21 NOTE — Telephone Encounter (Signed)
Message copied by Emelda Brothers on Tue Nov 21, 2013 10:19 AM ------      Message from: Unk Pinto      Created: Tue Nov 21, 2013  9:08 AM       A1c 6.0% in Oct and now up to 6.3% - need stricter diet - avoid sweets/ candy and white stuff      Vit D 92 - excellent       Chol 181 - also excellent       All else nl and ok       ------

## 2013-11-28 ENCOUNTER — Telehealth: Payer: Self-pay | Admitting: *Deleted

## 2013-11-28 NOTE — Telephone Encounter (Signed)
Patient called and states she has diarrhea and a sore throat and cough.  Per Dr Melford Aase, can take immodium for diarrhea and Dayquil and Nyquil for cough.  Patient aware.

## 2013-12-04 ENCOUNTER — Ambulatory Visit (INDEPENDENT_AMBULATORY_CARE_PROVIDER_SITE_OTHER): Payer: Medicare Other | Admitting: Physician Assistant

## 2013-12-04 ENCOUNTER — Encounter: Payer: Self-pay | Admitting: Physician Assistant

## 2013-12-04 VITALS — BP 138/78 | HR 60 | Temp 98.2°F | Resp 16 | Wt 151.0 lb

## 2013-12-04 DIAGNOSIS — J209 Acute bronchitis, unspecified: Secondary | ICD-10-CM

## 2013-12-04 MED ORDER — IPRATROPIUM-ALBUTEROL 0.5-2.5 (3) MG/3ML IN SOLN
3.0000 mL | Freq: Once | RESPIRATORY_TRACT | Status: AC
  Administered 2013-12-04: 3 mL via RESPIRATORY_TRACT

## 2013-12-04 MED ORDER — PREDNISONE 10 MG PO TABS: ORAL_TABLET | ORAL | Status: DC

## 2013-12-04 MED ORDER — ALBUTEROL SULFATE HFA 108 (90 BASE) MCG/ACT IN AERS: 2.0000 | INHALATION_SPRAY | Freq: Four times a day (QID) | RESPIRATORY_TRACT | Status: DC | PRN

## 2013-12-04 MED ORDER — AZITHROMYCIN 250 MG PO TABS
250.0000 mg | ORAL_TABLET | Freq: Every day | ORAL | Status: DC
Start: 2013-12-04 — End: 2013-12-26

## 2013-12-04 NOTE — Progress Notes (Signed)
   Subjective:    Patient ID: Briana [name], female    DOB: [birth year], 80 y.o.   MRN: [id number]  Cough This is a new problem. The current episode started in the past 7 days. The problem has been unchanged. The cough is productive of brown sputum. Associated symptoms include myalgias (lower back pain), postnasal drip, rhinorrhea, a sore throat and wheezing. Pertinent negatives include no chest pain, chills, ear congestion, ear pain, fever, headaches, heartburn, hemoptysis, nasal congestion, rash, shortness of breath, sweats or weight loss. Treatments tried: mucinex. The treatment provided no relief.    Review of Systems  Constitutional: Negative for fever, chills, weight loss, diaphoresis and fatigue.  HENT: Positive for postnasal drip, rhinorrhea and sore throat. Negative for ear pain.   Eyes: Negative.   Respiratory: Positive for cough, chest tightness and wheezing. Negative for hemoptysis and shortness of breath.   Cardiovascular: Negative.  Negative for chest pain.  Gastrointestinal: Negative.  Negative for heartburn.  Musculoskeletal: Positive for myalgias (lower back pain).  Skin: Negative for rash.  Neurological: Negative.  Negative for headaches.       Objective:   Physical Exam  Constitutional: She is oriented to person, place, and time. She appears well-developed and well-nourished.  HENT:  Head: Normocephalic and atraumatic.  Right Ear: External ear normal.  Left Ear: External ear normal.  Nose: Nose normal.  Mouth/Throat: Oropharynx is clear and moist.  Eyes: Conjunctivae are normal. Pupils are equal, round, and reactive to light.  Neck: Normal range of motion. Neck supple.  Cardiovascular: Normal rate and regular rhythm.   Pulmonary/Chest: Effort normal. No respiratory distress. She has wheezes. She has no rales. She exhibits no tenderness.  Abdominal: Soft. Bowel sounds are normal.  Lymphadenopathy:    She has no cervical adenopathy.  Neurological: She is alert and  oriented to person, place, and time.  Skin: Skin is warm and dry.       Assessment & Plan:  Acute bronchitis - Plan: azithromycin (ZITHROMAX) 250 MG tablet, predniSONE (DELTASONE) 10 MG tablet, ipratropium-albuterol (DUONEB) 0.5-2.5 (3) MG/3ML nebulizer solution 3 mL Sent in albuterol inhaler. Patient feels and lungs sound much better after nebulizer.

## 2013-12-04 NOTE — Patient Instructions (Signed)

## 2013-12-15 ENCOUNTER — Other Ambulatory Visit (INDEPENDENT_AMBULATORY_CARE_PROVIDER_SITE_OTHER): Payer: Medicare Other | Admitting: *Deleted

## 2013-12-15 DIAGNOSIS — Z1212 Encounter for screening for malignant neoplasm of rectum: Secondary | ICD-10-CM

## 2013-12-15 LAB — POC HEMOCCULT BLD/STL (HOME/3-CARD/SCREEN)
Card #2 Fecal Occult Blod, POC: NEGATIVE
Card #3 Fecal Occult Blood, POC: NEGATIVE
Fecal Occult Blood, POC: NEGATIVE

## 2013-12-19 ENCOUNTER — Other Ambulatory Visit: Payer: Self-pay | Admitting: Internal Medicine

## 2013-12-25 ENCOUNTER — Other Ambulatory Visit: Payer: Self-pay | Admitting: Internal Medicine

## 2013-12-25 ENCOUNTER — Ambulatory Visit: Payer: Self-pay | Admitting: Internal Medicine

## 2013-12-26 ENCOUNTER — Ambulatory Visit (INDEPENDENT_AMBULATORY_CARE_PROVIDER_SITE_OTHER): Payer: Medicare Other | Admitting: Internal Medicine

## 2013-12-26 ENCOUNTER — Encounter: Payer: Self-pay | Admitting: Internal Medicine

## 2013-12-26 VITALS — BP 130/66 | HR 68 | Temp 98.6°F | Resp 18 | Wt 155.2 lb

## 2013-12-26 DIAGNOSIS — M940 Chondrocostal junction syndrome [Tietze]: Secondary | ICD-10-CM

## 2013-12-26 DIAGNOSIS — G250 Essential tremor: Secondary | ICD-10-CM

## 2013-12-26 DIAGNOSIS — G252 Other specified forms of tremor: Secondary | ICD-10-CM

## 2013-12-26 MED ORDER — DIAZEPAM 5 MG PO TABS: ORAL_TABLET | ORAL | Status: DC

## 2013-12-26 MED ORDER — PREDNISONE 20 MG PO TABS: ORAL_TABLET | ORAL | Status: DC

## 2013-12-26 NOTE — Progress Notes (Signed)
Subjective:    Patient ID: Briana [name], female    DOB: 1934-05-02, 80 y.o.   MRN: [id number]  HPI Comments: Patient is seen in 1 month F/U of initiating Inderal 80 mg LA for Essential Tremor. She reports mild improvement with persistent  fine tremor assymetric to the Rt. She denies effect on handwriting at this point. Also recent labs 1 month ago found rise in A1c from 6.0 to 6.3%. She denies any diabetic polys paresthesias or visual blurring.    Medication List       This list is accurate as of: 12/26/13  8:14 PM.  Always use your most recent med list.               albuterol 108 (90 BASE) MCG/ACT inhaler  Commonly known as:  PROVENTIL HFA;VENTOLIN HFA  Inhale 2 puffs into the lungs every 6 (six) hours as needed for wheezing or shortness of breath.     aspirin 81 MG tablet  Take 81 mg by mouth daily.     b complex vitamins tablet  Take 1 tablet by mouth daily.     bumetanide 1 MG tablet  Commonly known as:  BUMEX  TAKE ONE-HALF TO ONE TABLET BY MOUTH TWICE DAILY AS NEEDED FOR SWELLING     diazepam 5 MG tablet  Commonly known as:  VALIUM  Take 1/2 to 1 tablet 3 x day as needed for tremor     OSTEO BI-FLEX JOINT SHIELD PO  Take by mouth 2 (two) times daily.     predniSONE 20 MG tablet  Commonly known as:  DELTASONE  1 tab 3 x day for 3 days, then 1 tab 2 x day for 3 days, then 1 tab 1 x day for 5 days for chest wall pain and inflammation     propranolol ER 80 MG 24 hr capsule  Commonly known as:  INDERAL LA  Take 1 capsule (80 mg total) by mouth daily. For tremor and BP     ranitidine 300 MG tablet  Commonly known as:  ZANTAC  TAKE ONE TABLET BY MOUTH TWICE DAILY FOR ACID REFLUX     tamoxifen 20 MG tablet  Commonly known as:  NOLVADEX  Take 20 mg by mouth daily.     Vitamin D3 2000 UNITS Tabs  Take 2,000-4,000 Units by mouth 2 (two) times daily. Take 4000 units in the morning and 2000 units in the evening       Allergies  Allergen Reactions  . Accupril  [Quinapril Hcl] Cough  . Reglan [Metoclopramide] Other (See Comments)    tremors  . Adhesive [Tape] Rash   Past Medical History  Diagnosis Date  . Hypertension   . Diverticulitis   . Hyperlipidemia   . Pre-diabetes   . Vitamin D deficiency   . DJD (degenerative joint disease)   . DDD (degenerative disc disease)       Review of Systems  Constitutional: Negative.   HENT: Negative.   Eyes: Negative.   Respiratory: Negative.   Cardiovascular: Negative.   Gastrointestinal: Negative.   Endocrine: Negative.   Genitourinary: Negative.   Musculoskeletal: Negative.   Skin: Negative.   Allergic/Immunologic: Negative.   Neurological: Positive for tremors. Negative for dizziness, seizures, speech difficulty, weakness, light-headedness, numbness and headaches.  Hematological: Negative.   Psychiatric/Behavioral: Negative.        Objective:   Physical Exam  Constitutional: She appears well-developed and well-nourished.  HENT:  Head: Normocephalic and atraumatic.  Mouth/Throat: No  oropharyngeal exudate.  Eyes: Conjunctivae and EOM are normal. Pupils are equal, round, and reactive to light. Right eye exhibits no discharge. Left eye exhibits no discharge.  Neck: Normal range of motion. Neck supple. No JVD present. No thyromegaly present.  Cardiovascular: Normal rate, regular rhythm and normal heart sounds.  Exam reveals no gallop.   No murmur heard. Pulmonary/Chest: Effort normal and breath sounds normal. No respiratory distress. She has no wheezes. She has no rales.  Abdominal: Soft. Bowel sounds are normal.  Musculoskeletal: Normal range of motion.  Lymphadenopathy:    She has no cervical adenopathy.  Neurological: She is alert. She has normal reflexes. She displays normal reflexes. No cranial nerve deficit.  High frequency low amplitude tremor of Rt > Lt hand with intention.    Assessment & Plan:   1. Essential tremor Add low dose diazepam 5 mg - beginning 1/2  to 1 tablet  2-3 x da for tremor with sedative precautions. ROV 1 mo to reassess for benefits.  2. Costochondritis Trial Prednisone pulse/taper

## 2013-12-26 NOTE — Patient Instructions (Signed)
Tremor Tremor is a rhythmic, involuntary muscular contraction characterized by oscillations (to-and-fro movements) of a part of the body. The most common of all involuntary movements, tremor can affect various body parts such as the hands, head, facial structures, vocal cords, trunk, and legs; most tremors, however, occur in the hands. Tremor often accompanies neurological disorders associated with aging. Although the disorder is not life-threatening, it can be responsible for functional disability and social embarrassment. TREATMENT  There are many types of tremor and several ways in which tremor is classified. The most common classification is by behavioral context or position. There are five categories of tremor within this classification: resting, postural, kinetic, task-specific, and psychogenic. Resting or static tremor occurs when the muscle is at rest, for example when the hands are lying on the lap. This type of tremor is often seen in patients with Parkinson's disease. Postural tremor occurs when a patient attempts to maintain posture, such as holding the hands outstretched. Postural tremors include physiological tremor, essential tremor, tremor with basal ganglia disease (also seen in patients with Parkinson's disease), cerebellar postural tremor, tremor with peripheral neuropathy, post-traumatic tremor, and alcoholic tremor. Kinetic or intention (action) tremor occurs during purposeful movement, for example during finger-to-nose testing. Task-specific tremor appears when performing goal-oriented tasks such as handwriting, speaking, or standing. This group consists of primary writing tremor, vocal tremor, and orthostatic tremor. Psychogenic tremor occurs in both older and younger patients. The key feature of this tremor is that it dramatically lessens or disappears when the patient is distracted. PROGNOSIS There are some treatment options available for tremor; the appropriate treatment depends on  accurate diagnosis of the cause. Some tremors respond to treatment of the underlying condition, for example in some cases of psychogenic tremor treating the patient's underlying mental problem may cause the tremor to disappear. Also, patients with tremor due to Parkinson's disease may be treated with Levodopa drug therapy. Symptomatic drug therapy is available for several other tremors as well. For those cases of tremor in which there is no effective drug treatment, physical measures such as teaching the patient to brace the affected limb during the tremor are sometimes useful. Surgical intervention such as thalamotomy or deep brain stimulation may be useful in certain cases. Document Released: 10/23/2002 Document Revised: 01/25/2012 Document Reviewed: 11/02/2005 [facility] Patient Information 2014 [place].   Costochondritis Costochondritis, sometimes called Tietze syndrome, is a swelling and irritation (inflammation) of the tissue (cartilage) that connects your ribs with your breastbone (sternum). It causes pain in the chest and rib area. Costochondritis usually goes away on its own over time. It can take up to 6 weeks or longer to get better, especially if you are unable to limit your activities. CAUSES  Some cases of costochondritis have no known cause. Possible causes include:  Injury (trauma).  Exercise or activity such as lifting.  Severe coughing. SIGNS AND SYMPTOMS  Pain and tenderness in the chest and rib area.  Pain that gets worse when coughing or taking deep breaths.  Pain that gets worse with specific movements. DIAGNOSIS  Your health care provider will do a physical exam and ask about your symptoms. Chest X-rays or other tests may be done to rule out other problems. TREATMENT  Costochondritis usually goes away on its own over time. Your health care provider may prescribe medicine to help relieve pain. HOME CARE INSTRUCTIONS   Avoid exhausting physical activity. Try not  to strain your ribs during normal activity. This would include any activities using chest, abdominal, and  side muscles, especially if heavy weights are used.  Apply ice to the affected area for the first 2 days after the pain begins.  Put ice in a plastic bag.  Place a towel between your skin and the bag.  Leave the ice on for 20 minutes, 2 3 times a day.  Only take over-the-counter or prescription medicines as directed by your health care provider. SEEK MEDICAL CARE IF:  You have redness or swelling at the rib joints. These are signs of infection.  Your pain does not go away despite rest or medicine. SEEK IMMEDIATE MEDICAL CARE IF:   Your pain increases or you are very uncomfortable.  You have shortness of breath or difficulty breathing.  You cough up blood.  You have worse chest pains, sweating, or vomiting.  You have a fever or persistent symptoms for more than 2 3 days.  You have a fever and your symptoms suddenly get worse. MAKE SURE YOU:   Understand these instructions.  Will watch your condition.  Will get help right away if you are not doing well or get worse. Document Released: 08/12/2005 Document Revised: 08/23/2013 Document Reviewed: 06/06/2013 [facility] Patient Information 2014 [place].

## 2013-12-26 NOTE — Progress Notes (Deleted)
Patient ID: Briana [name], female   DOB: 1934/07/21, 80 y.o.   MRN: [id number]

## 2014-01-06 ENCOUNTER — Emergency Department (HOSPITAL_COMMUNITY)
Admission: EM | Admit: 2014-01-06 | Discharge: 2014-01-06 | Disposition: A | Discharge status: Home / Self Care | Payer: Medicare Other | Attending: Emergency Medicine | Admitting: Emergency Medicine

## 2014-01-06 ENCOUNTER — Emergency Department (HOSPITAL_COMMUNITY): Payer: Medicare Other

## 2014-01-06 ENCOUNTER — Encounter (HOSPITAL_COMMUNITY): Payer: Self-pay | Admitting: Emergency Medicine

## 2014-01-06 DIAGNOSIS — I10 Essential (primary) hypertension: Secondary | ICD-10-CM | POA: Insufficient documentation

## 2014-01-06 DIAGNOSIS — S99929A Unspecified injury of unspecified foot, initial encounter: Secondary | ICD-10-CM

## 2014-01-06 DIAGNOSIS — M25551 Pain in right hip: Secondary | ICD-10-CM

## 2014-01-06 DIAGNOSIS — S79919A Unspecified injury of unspecified hip, initial encounter: Secondary | ICD-10-CM | POA: Insufficient documentation

## 2014-01-06 DIAGNOSIS — W010XXA Fall on same level from slipping, tripping and stumbling without subsequent striking against object, initial encounter: Secondary | ICD-10-CM | POA: Insufficient documentation

## 2014-01-06 DIAGNOSIS — IMO0002 Reserved for concepts with insufficient information to code with codable children: Secondary | ICD-10-CM | POA: Insufficient documentation

## 2014-01-06 DIAGNOSIS — M1990 Unspecified osteoarthritis, unspecified site: Secondary | ICD-10-CM | POA: Insufficient documentation

## 2014-01-06 DIAGNOSIS — S79929A Unspecified injury of unspecified thigh, initial encounter: Secondary | ICD-10-CM

## 2014-01-06 DIAGNOSIS — S99919A Unspecified injury of unspecified ankle, initial encounter: Secondary | ICD-10-CM

## 2014-01-06 DIAGNOSIS — S8990XA Unspecified injury of unspecified lower leg, initial encounter: Secondary | ICD-10-CM | POA: Insufficient documentation

## 2014-01-06 DIAGNOSIS — S52509A Unspecified fracture of the lower end of unspecified radius, initial encounter for closed fracture: Secondary | ICD-10-CM

## 2014-01-06 DIAGNOSIS — Z8719 Personal history of other diseases of the digestive system: Secondary | ICD-10-CM | POA: Insufficient documentation

## 2014-01-06 DIAGNOSIS — E559 Vitamin D deficiency, unspecified: Secondary | ICD-10-CM | POA: Insufficient documentation

## 2014-01-06 DIAGNOSIS — M25561 Pain in right knee: Secondary | ICD-10-CM

## 2014-01-06 DIAGNOSIS — Y929 Unspecified place or not applicable: Secondary | ICD-10-CM | POA: Insufficient documentation

## 2014-01-06 DIAGNOSIS — Z7982 Long term (current) use of aspirin: Secondary | ICD-10-CM | POA: Insufficient documentation

## 2014-01-06 DIAGNOSIS — Y939 Activity, unspecified: Secondary | ICD-10-CM | POA: Insufficient documentation

## 2014-01-06 DIAGNOSIS — Z79899 Other long term (current) drug therapy: Secondary | ICD-10-CM | POA: Insufficient documentation

## 2014-01-06 DIAGNOSIS — S52609A Unspecified fracture of lower end of unspecified ulna, initial encounter for closed fracture: Principal | ICD-10-CM

## 2014-01-06 DIAGNOSIS — W19XXXA Unspecified fall, initial encounter: Secondary | ICD-10-CM

## 2014-01-06 LAB — BASIC METABOLIC PANEL
BUN: 23 mg/dL (ref 6–23)
CO2: 30 mEq/L (ref 19–32)
Calcium: 9.5 mg/dL (ref 8.4–10.5)
Chloride: 100 mEq/L (ref 96–112)
Creatinine, Ser: 0.99 mg/dL (ref 0.50–1.10)
GFR calc Af Amer: 62 mL/min — ABNORMAL LOW (ref >90)
GFR calc non Af Amer: 53 mL/min — ABNORMAL LOW (ref >90)
Glucose, Bld: 124 mg/dL — ABNORMAL HIGH (ref 70–99)
Potassium: 4.2 mEq/L (ref 3.7–5.3)
Sodium: 141 mEq/L (ref 137–147)

## 2014-01-06 LAB — CBC WITH DIFFERENTIAL/PLATELET
Basophils Absolute: 0 10*3/uL (ref 0.0–0.1)
Basophils Relative: 0 % (ref 0–1)
Eosinophils Absolute: 0.2 10*3/uL (ref 0.0–0.7)
Eosinophils Relative: 1 % (ref 0–5)
HCT: 44.4 % (ref 36.0–46.0)
Hemoglobin: 14.6 g/dL (ref 12.0–15.0)
Lymphocytes Relative: 15 % (ref 12–46)
Lymphs Abs: 1.7 10*3/uL (ref 0.7–4.0)
MCH: 30.1 pg (ref 26.0–34.0)
MCHC: 32.9 g/dL (ref 30.0–36.0)
MCV: 91.5 fL (ref 78.0–100.0)
Monocytes Absolute: 1.2 10*3/uL — ABNORMAL HIGH (ref 0.1–1.0)
Monocytes Relative: 10 % (ref 3–12)
Neutro Abs: 8.8 10*3/uL — ABNORMAL HIGH (ref 1.7–7.7)
Neutrophils Relative %: 74 % (ref 43–77)
Platelets: 185 10*3/uL (ref 150–400)
RBC: 4.85 MIL/uL (ref 3.87–5.11)
RDW: 15.1 % (ref 11.5–15.5)
WBC: 11.8 10*3/uL — ABNORMAL HIGH (ref 4.0–10.5)

## 2014-01-06 MED ORDER — OXYCODONE-ACETAMINOPHEN 5-325 MG PO TABS
2.0000 | ORAL_TABLET | Freq: Once | ORAL | Status: AC
  Administered 2014-01-06: 2 via ORAL
  Filled 2014-01-06: qty 2

## 2014-01-06 MED ORDER — MORPHINE SULFATE 4 MG/ML IJ SOLN
4.0000 mg | Freq: Once | INTRAMUSCULAR | Status: AC
  Administered 2014-01-06: 4 mg via INTRAVENOUS
  Filled 2014-01-06: qty 1

## 2014-01-06 MED ORDER — FENTANYL CITRATE 0.05 MG/ML IJ SOLN
50.0000 ug | Freq: Once | INTRAMUSCULAR | Status: AC
  Administered 2014-01-06: 50 ug via INTRAMUSCULAR
  Filled 2014-01-06: qty 2

## 2014-01-06 MED ORDER — OXYCODONE-ACETAMINOPHEN 5-325 MG PO TABS: 1.0000 | ORAL_TABLET | Freq: Four times a day (QID) | ORAL | Status: DC | PRN

## 2014-01-06 NOTE — Progress Notes (Signed)
Orthopedic Tech Progress Note Patient Details:  Briana [name] June 07, 1934 [id number]  Ortho Devices Type of Ortho Device: Volar splint Ortho Device/Splint Interventions: Application   Cammer, Theodoro Parma 01/06/2014, 2:36 PM

## 2014-01-06 NOTE — Progress Notes (Signed)
Orthopedic Tech Progress Note Patient Details:  Briana [name] [birth year] [id number]  Ortho Devices Type of Ortho Device: Arm sling Ortho Device/Splint Interventions: Application   Cammer, Theodoro Parma 01/06/2014, 3:48 PM

## 2014-01-06 NOTE — ED Provider Notes (Signed)
CSN: [id number]     Arrival date & time 01/06/14  1001 History   First MD Initiated Contact with Patient 01/06/14 1032     Chief Complaint  Patient presents with  . Fall  . Hip Pain  . Knee Pain  . Wrist Pain     (Consider location/radiation/quality/duration/timing/severity/associated sxs/prior Treatment) HPI Comments: Patient is a 80 year old female who presents to the emergency department after a mechanical slip and fall on ice this morning. Patient states that primary impact was to her right side. She denies head trauma or loss of consciousness. Patient complaining of pain to right wrist, right hip, and right knee as well as her low back. She describes the pain as aching and sore in nature and nonradiating in all locations. Patient states that movement and palpation, specifically to her right wrist, worsens the pain. She denies any alleviating factors and has not taken anything for pain prior to arrival. Patient denies associated numbness/tingling, weakness, pallor, urinary symptoms such as dysuria or hematuria, bowel or bladder incontinence, saddle anesthesia, and perianal numbness. Patient denies the use of blood thinners. She is right-hand dominant.  Patient is a 80 y.o. female presenting with fall, hip pain, knee pain, and wrist pain. The history is provided by the patient. No language interpreter was used.  Fall Associated symptoms include arthralgias, joint swelling and myalgias.  Hip Pain Associated symptoms include arthralgias, joint swelling and myalgias.  Knee Pain Associated symptoms: back pain   Wrist Pain Associated symptoms include arthralgias, joint swelling and myalgias.    Past Medical History  Diagnosis Date  . Hypertension   . Diverticulitis   . Hyperlipidemia   . Pre-diabetes   . Vitamin D deficiency   . DJD (degenerative joint disease)   . DDD (degenerative disc disease)    Past Surgical History  Procedure Laterality Date  . Joint replacement    .  Thyroid surgery     Family History  Problem Relation Age of Onset  . Hypertension Mother   . Hyperlipidemia Mother   . Heart disease Sister   . Cancer Brother     prostate   History  Substance Use Topics  . Smoking status: Never Smoker   . Smokeless tobacco: Not on file  . Alcohol Use: No   OB History   Grav Para Term Preterm Abortions TAB SAB Ect Mult Living                 Review of Systems  Musculoskeletal: Positive for arthralgias, back pain, joint swelling and myalgias.  All other systems reviewed and are negative.      Allergies  Accupril; Reglan; and Adhesive  Home Medications   Current Outpatient Rx  Name  Route  Sig  Dispense  Refill  . albuterol (PROVENTIL HFA;VENTOLIN HFA) 108 (90 BASE) MCG/ACT inhaler   Inhalation   Inhale 2 puffs into the lungs every 6 (six) hours as needed for wheezing or shortness of breath.   1 Inhaler   2   . aspirin 81 MG tablet   Oral   Take 81 mg by mouth daily.         Marland Kitchen b complex vitamins tablet   Oral   Take 1 tablet by mouth daily.         . bumetanide (BUMEX) 1 MG tablet   Oral   Take 0.5 mg by mouth 2 (two) times daily.         . Cholecalciferol (VITAMIN D3) 2000 UNITS TABS  Oral   Take 2,000-4,000 Units by mouth 2 (two) times daily. Take 4000 units in the morning and 2000 units in the evening         . MAGNESIUM PO   Oral   Take 1 tablet by mouth daily.         . Misc Natural Products (OSTEO BI-FLEX JOINT SHIELD PO)   Oral   Take by mouth 2 (two) times daily.         Marland Kitchen PRESCRIPTION MEDICATION   Oral   Take 1 tablet by mouth 2 (two) times daily.         Marland Kitchen PRESCRIPTION MEDICATION   Oral   Take 1 tablet by mouth 3 (three) times daily as needed (for tremors).         . tamoxifen (NOLVADEX) 20 MG tablet   Oral   Take 20 mg by mouth daily.          BP 142/79  Pulse 85  Temp(Src) 98.3 F (36.8 C) (Oral)  Resp 16  SpO2 95%  Physical Exam  Nursing note and vitals  reviewed. Constitutional: She is oriented to person, place, and time. She appears well-developed and well-nourished. No distress.  HENT:  Head: Normocephalic and atraumatic.  Eyes: Conjunctivae and EOM are normal. No scleral icterus.  Neck: Normal range of motion.  Cardiovascular: Normal rate, regular rhythm and intact distal pulses.   Distal radial, dorsalis pedis, and posterior tibial pulses 2+ bilaterally. Capillary refill normal in all digits of right hand.  Pulmonary/Chest: Effort normal. No respiratory distress.  Musculoskeletal:       Right wrist: She exhibits decreased range of motion, tenderness, bony tenderness, swelling and deformity. She exhibits no crepitus.       Right hip: She exhibits decreased range of motion (Secondary to pain) and tenderness. She exhibits normal strength, no bony tenderness, no crepitus and no deformity (No shortening or internal rotation of right lower extremity).       Right knee: She exhibits swelling. She exhibits normal range of motion, no ecchymosis, no deformity, no erythema, normal alignment, no LCL laxity, no bony tenderness and no MCL laxity. Tenderness found. Medial joint line tenderness noted.       Right ankle: Normal.       Right forearm: Normal.       Right hand: Normal.       Right upper leg: Normal.       Right lower leg: Normal.       Right foot: Normal.  Neurological: She is alert and oriented to person, place, and time. She has normal reflexes.  Sensation to light touch is intact in all extremities. Patellar and Achilles reflexes 2+ bilaterally. Patient moves extremities without ataxia. Patient speaks in full goal oriented sentences.  Skin: Skin is warm and dry. No rash noted. She is not diaphoretic. No erythema. No pallor.  Psychiatric: She has a normal mood and affect. Her behavior is normal.    ED Course  Procedures (including critical care time) Labs Review Labs Reviewed  CBC WITH DIFFERENTIAL - Abnormal; Notable for the  following:    WBC 11.8 (*)    Neutro Abs 8.8 (*)    Monocytes Absolute 1.2 (*)    All other components within normal limits  BASIC METABOLIC PANEL - Abnormal; Notable for the following:    Glucose, Bld 124 (*)    GFR calc non Af Amer 53 (*)    GFR calc Af Amer 62 (*)  All other components within normal limits   Imaging Review Dg Lumbar Spine Complete  01/06/2014   CLINICAL DATA:  Fall.  Low back injury and pain.  EXAM: LUMBAR SPINE - COMPLETE 4+ VIEW  COMPARISON:  None.  FINDINGS: There is no evidence of lumbar spine fracture. Alignment is normal. Mild to moderate degenerative disc disease is seen at levels of L4-5 and L5-S1. Mild bilateral facet DJD also seen at these levels.  Generalized osteopenia noted. No focal lytic or sclerotic bone lesions identified. Left hip prosthesis incidentally noted.  IMPRESSION: No acute findings. Degenerative spondylosis, as described above.   Electronically Signed   By: Earle Gell M.D.   On: 01/06/2014 11:37   Dg Wrist Complete Right  01/06/2014   CLINICAL DATA:  Fall.  Wrist injury and pain.  EXAM: RIGHT WRIST - COMPLETE 3+ VIEW  COMPARISON:  None.  FINDINGS: Comminuted fracture of the distal radius is seen with intra-articular extension. There is mild dorsal angulation of the distal articular surface of the radius. Associated ulnar styloid process fracture also noted. Carpal bones remain normal alignment.  IMPRESSION: Comminuted fracture of the distal radius, and associated ulnar styloid process fracture.   Electronically Signed   By: Earle Gell M.D.   On: 01/06/2014 11:29   Dg Hip Complete Right  01/06/2014   CLINICAL DATA:  Fall.  Right hip pain.  EXAM: RIGHT HIP - COMPLETE 2+ VIEW  COMPARISON:  04/16/2010 and 10/08/2008  FINDINGS: Left total hip arthroplasty is partially visualized. The right hip is located. There is mild right hip osteoarthrosis. No acute fracture is identified. Surgical material overlies the right iliac wing. Bones appear diffusely  osteopenic.  IMPRESSION: No acute osseous abnormality identified.   Electronically Signed   By: Logan Bores   On: 01/06/2014 11:38   Ct Pelvis Wo Contrast  01/06/2014   CLINICAL DATA:  [place] on ice this morning, injured right hip, severe right hip and pelvic pain.  EXAM: CT PELVIS WITHOUT CONTRAST  TECHNIQUE: Multidetector CT imaging of the pelvis was performed following the standard protocol without intravenous contrast.  COMPARISON:  Right hip x-rays earlier same date.  FINDINGS: Severe osseous demineralization. No fractures identified involving the bony pelvis or either proximal femur. Severe narrowing of the joint space in the right hip. Prior left hip arthroplasty with anatomic alignment of the prosthesis. Sacroiliac joints intact. Symphysis pubis intact with severe degenerative changes. Severe degenerative disc disease and spondylosis and facet degenerative changes at the L5-S1 level.  Soft tissue window images demonstrate no soft tissue hematoma in the vicinity of the right hip. There is no significant right hip joint effusion.  Extensive sigmoid colon diverticulosis. Enlarged uterus with a fibroid involving the left lateral uterine body. Endometrial thickening and/or fluid. Mild aortoiliofemoral atherosclerosis without aneurysm. Small bilateral inguinal hernias containing fat.  IMPRESSION: 1. No acute fractures involving the bony pelvis or either proximal femur. 2. Severe joint space narrowing involving the right hip. 3. Prior left hip arthroplasty with anatomic alignment. 4. Endometrial thickening and/or fluid, more than expected for a patient of this age. Is there dysfunctional uterine bleeding? 5. Fibroid involving the left lateral uterine body. 6. Extensive sigmoid colon diverticulosis without evidence of acute diverticulitis.   Electronically Signed   By: Evangeline Dakin M.D.   On: 01/06/2014 14:24   Dg Knee Complete 4 Views Right  01/06/2014   CLINICAL DATA:  Fall, knee pain  EXAM: RIGHT KNEE -  COMPLETE 4+ VIEW  COMPARISON:  None.  FINDINGS: Bones are osteopenic. No displaced fracture. Minor chondrocalcinosis noted. Relatively preserved joint spaces. On the lateral view, there is a joint effusion with a fluid level suspicious for hemarthrosis.  IMPRESSION: Osteopenia.  Joint effusion/probable hemarthrosis  No acute displaced fracture by plain radiography   Electronically Signed   By: Daryll Brod M.D.   On: 01/06/2014 11:48    EKG Interpretation    Date/Time:  Saturday January 06 2014 13:06:39 EST Ventricular Rate:  89 PR Interval:    QRS Duration: 99 QT Interval:  375 QTC Calculation: 456 R Axis:   -96 Text Interpretation:  Normal sinus rhythm Left anterior fascicular block Low voltage, precordial leads Anteroseptal infarct, old No significant change since last tracing Confirmed by DOCHERTY  MD, MEGAN [phone]) on 01/06/2014 1:21:51 PM            MDM   Final diagnoses:  Distal radial fracture  Hip pain, right  Knee pain, right  Fall   80 year old female presents after a slip and fall on ice this morning. Patient denies head trauma and loss of consciousness. Complaining mostly on arrival of right wrist pain, right hip pain, and right knee pain as well as pain to her low back. Patient denies incontinence and saddle anesthesia; no red flags or signs concerning for cauda equina on examination. Patient noted to be neurovascularly intact. No focal neurologic deficits appreciated; however, patient resistant to ambulation secondary to pain.  X-ray of right hip negative for fracture or dislocation. Patient has no shortening or rotation of her right lower extremity on exam. Given resistance to ambulation, CT pelvis ordered to rule out fracture. CT scan shows no acute fractures of the bony pelvis or right femur today.  Patient is noted today to have a comminuted fracture of her distal radius as well as an ulnar styloid process fracture. I have consulted with Dr. Caralyn Guile who has  reviewed the imaging. He believes patient to be stable and appropriate for outpatient followup in office to discuss proper healing and potential surgical repair. Patient placed in volar splint in ED for stability until patient able to followup in office.  Patient stable and appropriate for discharge today with referral to hand surgeon. Patient given prescription for Percocet to take as needed for pain. Have also recommended rest, elevation, and ice to the area. Return precautions discussed and patient agreeable to plan with no unaddressed concerns.   Filed Vitals:   01/06/14 1215 01/06/14 1229 01/06/14 1300 01/06/14 1440  BP: 169/75 180/79 142/79 157/72  Pulse: 74 72 85 76  Temp:      TempSrc:      Resp:  16  18  SpO2: 98% 99% 95% 94%        Antonietta Breach, PA-C 01/06/14 1527

## 2014-01-06 NOTE — ED Provider Notes (Signed)
Medical screening examination/treatment/procedure(s) were performed by non-physician practitioner and as supervising physician I was immediately available for consultation/collaboration. Pt slipped & fell on ice today, was found to have comminuted fracture of her distal radius as well as an ulnar styloid process fracture. She also complained of R hip pain, but had no acute traumatic findings of XR or CT.  Ortho consulted for dispo planning, she will f/u as outpt.  On PE, VSS, pt in NAD. +ttp over R wrist, but NVI distally.  Cardiopulm exam benign.       EKG Interpretation    Date/Time:  Saturday January 06 2014 13:06:39 EST Ventricular Rate:  89 PR Interval:    QRS Duration: 99 QT Interval:  375 QTC Calculation: 456 R Axis:   -96 Text Interpretation:  Normal sinus rhythm Left anterior fascicular block Low voltage, precordial leads Anteroseptal infarct, old No significant change since last tracing Confirmed by [place]  MD, [place] [phone]) on 01/06/2014 1:21:51 PM              Neta Ehlers, MD 01/06/14 2026

## 2014-01-06 NOTE — ED Notes (Signed)
Pt arrived with daughter and son in law. Pt slipped on ice this morning and fell on right side. Denies hitting head or LOC. C/o right hip, right wrist and right knee pain. No shortening or rotation noted to right leg. Swelling to right wrist and right knee noted.

## 2014-01-06 NOTE — Discharge Instructions (Signed)
Recommend you followup with Dr. Caralyn Guile for further evaluation of your distal radial fracture. Maintain your splint for stability. Also recommend rest, elevation, and ice to the area. Take Percocet as needed for pain. Do not take Tylenol/acetaminophen in addition to Percocet as there is already Tylenol in this medication. Follow up with your primary care provider as needed. Return to the emergency department if symptoms worsen.  Wrist Fracture A wrist fracture is a break in one of the bones of the wrist. Your wrist is made up of several small bones at the palm of your hand (carpal bones) and the two bones that make up your forearm (radius and ulna). The bones come together to form multiple large and small joints. The shape and design of these joints allow your wrist to bend and straighten, move side-to-side, and rotate, as in twisting your palm up or down. CAUSES  A fracture may occur in any of the bones in your wrist when enough force is applied to the wrist, such as when falling down onto an outstretched hand. Severe injuries may occur from a more forceful injury. SYMPTOMS Symptoms of wrist fractures include tenderness, bruising, and swelling. Additionally, the wrist may hang in an odd position or may be misshaped. DIAGNOSIS To diagnose a wrist fracture, your caregiver will physically examine your wrist. Your caregiver may also request an X-ray exam of your wrist. TREATMENT Treatment depends on many factors, including the nature and location of the fracture, your age, and your activity level. Treatment for wrist fracture can be nonsurgical or surgical. For nonsurgical treatment, a plaster cast or splint may be applied to your wrist if the bone is in a good position (aligned). The cast will stay on for about 6 weeks. If the alignment of your bone is not good, it may be necessary to realign (reduce) it. After the bone is reduced, a splint usually is placed on your wrist to allow for a small amount of  normal swelling. After about 1 week, the splint is removed and a cast is added. The cast is removed 2 or 3 weeks later, after the swelling goes down, causing the cast to loosen. Another cast is applied. This cast is removed after about another 2 or 3 weeks, for a total of 4 to 6 weeks of immobilization. Sometimes the position of the bone is so far out of place that surgery is required to apply a device to hold it together as it heals. If the bone cannot be reduced without cutting the skin around the bone (closed reduction), a cut (incision) is made to allow direct access to the bone to reduce it (open reduction). Depending on the fracture, there are a number of options for holding the bone in place while it heals, including a cast, metal pins, a plate and screws, and a device called an external fixator. With an external fixator, most of the hardware remains outside of the body. HOME CARE INSTRUCTIONS  To lessen swelling, keep your injured wrist elevated and move your fingers as much as possible.  Apply ice to your wrist for the first 1 to 2 days after you have been treated or as directed by your caregiver. Applying ice helps to reduce inflammation and pain.  Put ice in a plastic bag.  Place a towel between your skin and the bag.  Leave the ice on for 15 to 20 minutes at a time, every 2 hours while you are awake.  Do not put pressure on any part of  your cast or splint. It may break.  Use a plastic bag to protect your cast or splint from water while bathing or showering. Do not lower your cast or splint into water.  Only take over-the-counter or prescription medicines for pain as directed by your caregiver. SEEK IMMEDIATE MEDICAL CARE IF:   Your cast or splint gets damaged or breaks.  You have continued severe pain or more swelling than you did before the cast was put on.  Your skin or fingernails below the injury turn blue or gray or feel cold or numb.  You develop decreased feeling in your  fingers. MAKE SURE YOU:  Understand these instructions.  Will watch your condition.  Will get help right away if you are not doing well or get worse. Document Released: 08/12/2005 Document Revised: 01/25/2012 Document Reviewed: 11/20/2011 [facility] Patient Information 2014 [place], [place]. RICE: Routine Care for Injuries The routine care of many injuries includes Rest, Ice, Compression, and Elevation (RICE). HOME CARE INSTRUCTIONS  Rest is needed to allow your body to heal. Routine activities can usually be resumed when comfortable. Injured tendons and bones can take up to 6 weeks to heal. Tendons are the cord-like structures that attach muscle to bone.  Ice following an injury helps keep the swelling down and reduces pain.  Put ice in a plastic bag.  Place a towel between your skin and the bag.  Leave the ice on for 15-20 minutes, 03-04 times a day. Do this while awake, for the first 24 to 48 hours. After that, continue as directed by your caregiver.  Compression helps keep swelling down. It also gives support and helps with discomfort. If an elastic bandage has been applied, it should be removed and reapplied every 3 to 4 hours. It should not be applied tightly, but firmly enough to keep swelling down. Watch fingers or toes for swelling, bluish discoloration, coldness, numbness, or excessive pain. If any of these problems occur, remove the bandage and reapply loosely. Contact your caregiver if these problems continue.  Elevation helps reduce swelling and decreases pain. With extremities, such as the arms, hands, legs, and feet, the injured area should be placed near or above the level of the heart, if possible. SEEK IMMEDIATE MEDICAL CARE IF:  You have persistent pain and swelling.  You develop redness, numbness, or unexpected weakness.  Your symptoms are getting worse rather than improving after several days. These symptoms may indicate that further evaluation or further X-rays  are needed. Sometimes, X-rays may not show a small broken bone (fracture) until 1 week or 10 days later. Make a follow-up appointment with your caregiver. Ask when your X-ray results will be ready. Make sure you get your X-ray results. Document Released: 02/14/2001 Document Revised: 01/25/2012 Document Reviewed: 04/03/2011 [facility] Patient Information 2014 [place], [place].

## 2014-01-09 ENCOUNTER — Encounter (HOSPITAL_COMMUNITY): Payer: Self-pay | Admitting: *Deleted

## 2014-01-09 MED ORDER — CEFAZOLIN SODIUM-DEXTROSE 2-3 GM-% IV SOLR
2.0000 g | INTRAVENOUS | Status: AC
Start: 2014-01-10 — End: 2014-01-10
  Administered 2014-01-10: 2 g via INTRAVENOUS
  Filled 2014-01-09: qty 50

## 2014-01-09 MED ORDER — CHLORHEXIDINE GLUCONATE 4 % EX LIQD
60.0000 mL | Freq: Once | CUTANEOUS | Status: DC
  Filled 2014-01-09: qty 60

## 2014-01-09 NOTE — H&P (Signed)
Briana [name] is an 80 y.o. female.   Chief Complaint: right wrist injury HPI: Patient is a 80 year old female who presents to the emergency department after a mechanical slip and fall on ice this past Saturday. Patient states that primary impact was to her right side. She denies head trauma or loss of consciousness. She describes the pain as aching and sore in nature and nonradiating in all locations. Patient states that movement and palpation, specifically to her right wrist, worsens the pain.  Patient denies the use of blood thinners. She is right-hand dominant.   Past Medical History  Diagnosis Date  . Hypertension   . Diverticulitis   . Hyperlipidemia   . Pre-diabetes   . Vitamin D deficiency   . DJD (degenerative joint disease)   . DDD (degenerative disc disease)     Past Surgical History  Procedure Laterality Date  . Joint replacement    . Thyroid surgery      Family History  Problem Relation Age of Onset  . Hypertension Mother   . Hyperlipidemia Mother   . Heart disease Sister   . Cancer Brother     prostate   Social History:  reports that she has never smoked. She does not have any smokeless tobacco history on file. She reports that she does not drink alcohol or use illicit drugs.  Allergies:  Allergies  Allergen Reactions  . Accupril [Quinapril Hcl] Cough  . Reglan [Metoclopramide] Other (See Comments)    tremors  . Adhesive [Tape] Rash    No prescriptions prior to admission    No results found for this or any previous visit (from the past 28 hour(s)). No results found.  ROS NO RECENT ILLNESSES OR HOSPITALIZATIONS  There were no vitals taken for this visit. Physical Exam  General Appearance:  Alert, cooperative, no distress, appears stated age  Head:  Normocephalic, without obvious abnormality, atraumatic  Eyes:  Pupils equal, conjunctiva/corneas clear,         Throat: Lips, mucosa, and tongue normal; teeth and gums normal  Neck: No visible masses     Lungs:   respirations unlabored  Chest Wall:  No tenderness or deformity  Heart:  Regular rate and rhythm,  Abdomen:   Soft, non-tender,         Extremities: RIGHT WRIST: SPLINT IN PLACE IN PLACE, FINGERS WARM WELL PERFUSED +DEFORMITY TO DORSUM OF WRIST LIMITED DIGITAL MOTION AND THUMB MOTION  Pulses: 2+ and symmetric  Skin: Skin color, texture, turgor normal, no rashes or lesions     Neurologic: Normal    Assessment/Plan RIGHT DISTAL RADIUS FRACTURE, CLOSED, COMMINUTED, INTRAARTICULAR DISPLACED AND ANGULATED  RIGHT DISTAL RADIUS CLOSED MANIPULATION AND PINNING POSSIBLE OPEN REDUCTION AND INTERNAL FIXATION  R/B/A DISCUSSED WITH PT IN OFFICE.  PT VOICED UNDERSTANDING OF PLAN CONSENT SIGNED DAY OF SURGERY PT SEEN AND EXAMINED PRIOR TO OPERATIVE PROCEDURE/DAY OF SURGERY SITE MARKED. QUESTIONS ANSWERED WILL REMAIN OBSERVATION FOLLOWING SURGERY  Linna Hoff 01/10/2014 1357

## 2014-01-09 NOTE — Progress Notes (Signed)
Pt's PCP is Dr. Unk Pinto.

## 2014-01-10 ENCOUNTER — Encounter (HOSPITAL_COMMUNITY): Payer: Self-pay | Admitting: *Deleted

## 2014-01-10 ENCOUNTER — Ambulatory Visit (HOSPITAL_COMMUNITY)
Admission: RE | Admit: 2014-01-10 | Discharge: 2014-01-12 | Disposition: A | Discharge status: Home / Self Care | Payer: Medicare Other | Source: Ambulatory Visit | Attending: Orthopedic Surgery | Admitting: Orthopedic Surgery

## 2014-01-10 ENCOUNTER — Ambulatory Visit (HOSPITAL_COMMUNITY): Payer: Medicare Other | Admitting: Anesthesiology

## 2014-01-10 ENCOUNTER — Ambulatory Visit (HOSPITAL_COMMUNITY): Payer: Medicare Other

## 2014-01-10 ENCOUNTER — Encounter (HOSPITAL_COMMUNITY): Payer: Medicare Other | Admitting: Anesthesiology

## 2014-01-10 ENCOUNTER — Encounter (HOSPITAL_COMMUNITY)
Admission: RE | Disposition: A | Discharge status: Home / Self Care | Payer: Self-pay | Source: Ambulatory Visit | Attending: Orthopedic Surgery

## 2014-01-10 DIAGNOSIS — S5291XA Unspecified fracture of right forearm, initial encounter for closed fracture: Secondary | ICD-10-CM | POA: Diagnosis present

## 2014-01-10 DIAGNOSIS — S52599A Other fractures of lower end of unspecified radius, initial encounter for closed fracture: Secondary | ICD-10-CM | POA: Insufficient documentation

## 2014-01-10 DIAGNOSIS — Z9660 Presence of unspecified orthopedic joint implant: Secondary | ICD-10-CM | POA: Insufficient documentation

## 2014-01-10 DIAGNOSIS — E559 Vitamin D deficiency, unspecified: Secondary | ICD-10-CM | POA: Insufficient documentation

## 2014-01-10 DIAGNOSIS — W010XXA Fall on same level from slipping, tripping and stumbling without subsequent striking against object, initial encounter: Secondary | ICD-10-CM | POA: Insufficient documentation

## 2014-01-10 DIAGNOSIS — M1990 Unspecified osteoarthritis, unspecified site: Secondary | ICD-10-CM | POA: Insufficient documentation

## 2014-01-10 DIAGNOSIS — Z888 Allergy status to other drugs, medicaments and biological substances status: Secondary | ICD-10-CM | POA: Insufficient documentation

## 2014-01-10 DIAGNOSIS — I10 Essential (primary) hypertension: Secondary | ICD-10-CM | POA: Insufficient documentation

## 2014-01-10 DIAGNOSIS — R7309 Other abnormal glucose: Secondary | ICD-10-CM | POA: Insufficient documentation

## 2014-01-10 DIAGNOSIS — E785 Hyperlipidemia, unspecified: Secondary | ICD-10-CM | POA: Insufficient documentation

## 2014-01-10 DIAGNOSIS — IMO0002 Reserved for concepts with insufficient information to code with codable children: Secondary | ICD-10-CM | POA: Insufficient documentation

## 2014-01-10 HISTORY — DX: Malignant (primary) neoplasm, unspecified: C80.1

## 2014-01-10 HISTORY — DX: Personal history of other diseases of the digestive system: Z87.19

## 2014-01-10 HISTORY — DX: Asymptomatic varicose veins of unspecified lower extremity: I83.90

## 2014-01-10 HISTORY — PX: OPEN REDUCTION INTERNAL FIXATION (ORIF) DISTAL RADIAL FRACTURE: SHX5989

## 2014-01-10 HISTORY — DX: Tremor, unspecified: R25.1

## 2014-01-10 HISTORY — DX: Cardiac murmur, unspecified: R01.1

## 2014-01-10 HISTORY — DX: Gastro-esophageal reflux disease without esophagitis: K21.9

## 2014-01-10 HISTORY — DX: Age-related osteoporosis without current pathological fracture: M81.0

## 2014-01-10 LAB — GLUCOSE, CAPILLARY
Glucose-Capillary: 134 mg/dL — ABNORMAL HIGH (ref 70–99)
Glucose-Capillary: 146 mg/dL — ABNORMAL HIGH (ref 70–99)

## 2014-01-10 SURGERY — OPEN REDUCTION INTERNAL FIXATION (ORIF) DISTAL RADIUS FRACTURE
Anesthesia: General | Site: Wrist | Laterality: Right

## 2014-01-10 MED ORDER — METHOCARBAMOL 500 MG PO TABS
ORAL_TABLET | ORAL | Status: AC
  Filled 2014-01-10: qty 1

## 2014-01-10 MED ORDER — HYDROCODONE-ACETAMINOPHEN 5-325 MG PO TABS
1.0000 | ORAL_TABLET | ORAL | Status: DC | PRN
  Administered 2014-01-11: 1 via ORAL
  Filled 2014-01-10: qty 1

## 2014-01-10 MED ORDER — ONDANSETRON HCL 4 MG/2ML IJ SOLN: 4.0000 mg | Freq: Four times a day (QID) | INTRAMUSCULAR | Status: DC | PRN

## 2014-01-10 MED ORDER — PROPOFOL 10 MG/ML IV BOLUS
INTRAVENOUS | Status: DC | PRN
  Administered 2014-01-10: 100 mg via INTRAVENOUS

## 2014-01-10 MED ORDER — ASPIRIN 81 MG PO CHEW
81.0000 mg | CHEWABLE_TABLET | Freq: Every day | ORAL | Status: DC
  Administered 2014-01-11: 81 mg via ORAL
  Filled 2014-01-10 (×2): qty 1

## 2014-01-10 MED ORDER — LACTATED RINGERS IV SOLN
INTRAVENOUS | Status: DC
  Administered 2014-01-10: 11:00:00 via INTRAVENOUS

## 2014-01-10 MED ORDER — HYDROMORPHONE HCL PF 1 MG/ML IJ SOLN
0.2500 mg | INTRAMUSCULAR | Status: DC | PRN
  Administered 2014-01-10 (×2): 0.5 mg via INTRAVENOUS
  Administered 2014-01-10 (×4): 0.25 mg via INTRAVENOUS

## 2014-01-10 MED ORDER — MORPHINE SULFATE 2 MG/ML IJ SOLN
1.0000 mg | INTRAMUSCULAR | Status: DC | PRN
  Administered 2014-01-11 (×2): 1 mg via INTRAVENOUS
  Filled 2014-01-10 (×2): qty 1

## 2014-01-10 MED ORDER — CEFAZOLIN SODIUM 1-5 GM-% IV SOLN
1.0000 g | Freq: Three times a day (TID) | INTRAVENOUS | Status: DC
  Administered 2014-01-10 – 2014-01-12 (×5): 1 g via INTRAVENOUS
  Filled 2014-01-10 (×7): qty 50

## 2014-01-10 MED ORDER — BUPIVACAINE HCL (PF) 0.25 % IJ SOLN
INTRAMUSCULAR | Status: AC
  Filled 2014-01-10: qty 30

## 2014-01-10 MED ORDER — OXYCODONE-ACETAMINOPHEN 5-325 MG PO TABS
1.0000 | ORAL_TABLET | ORAL | Status: DC | PRN
  Administered 2014-01-10: 2 via ORAL
  Administered 2014-01-10 (×2): 1 via ORAL
  Administered 2014-01-11 – 2014-01-12 (×5): 2 via ORAL
  Filled 2014-01-10 (×2): qty 1
  Filled 2014-01-10 (×3): qty 2
  Filled 2014-01-10: qty 1
  Filled 2014-01-10 (×2): qty 2

## 2014-01-10 MED ORDER — DOCUSATE SODIUM 100 MG PO CAPS
100.0000 mg | ORAL_CAPSULE | Freq: Two times a day (BID) | ORAL | Status: DC
Start: 2014-01-10 — End: 2014-01-12
  Administered 2014-01-10 – 2014-01-11 (×3): 100 mg via ORAL
  Filled 2014-01-10 (×5): qty 1

## 2014-01-10 MED ORDER — DIAZEPAM 5 MG PO TABS
5.0000 mg | ORAL_TABLET | Freq: Four times a day (QID) | ORAL | Status: DC | PRN
  Administered 2014-01-11 (×2): 5 mg via ORAL
  Filled 2014-01-10 (×2): qty 1

## 2014-01-10 MED ORDER — BUMETANIDE 0.5 MG PO TABS
0.5000 mg | ORAL_TABLET | Freq: Two times a day (BID) | ORAL | Status: DC
  Administered 2014-01-10 – 2014-01-12 (×4): 0.5 mg via ORAL
  Filled 2014-01-10 (×7): qty 1

## 2014-01-10 MED ORDER — FENTANYL CITRATE 0.05 MG/ML IJ SOLN
INTRAMUSCULAR | Status: AC
  Filled 2014-01-10: qty 5

## 2014-01-10 MED ORDER — DIPHENHYDRAMINE HCL 25 MG PO CAPS: 25.0000 mg | ORAL_CAPSULE | Freq: Four times a day (QID) | ORAL | Status: DC | PRN

## 2014-01-10 MED ORDER — FAMOTIDINE 20 MG PO TABS
20.0000 mg | ORAL_TABLET | Freq: Two times a day (BID) | ORAL | Status: DC
  Administered 2014-01-10 – 2014-01-11 (×3): 20 mg via ORAL
  Filled 2014-01-10 (×5): qty 1

## 2014-01-10 MED ORDER — ONDANSETRON HCL 4 MG PO TABS
4.0000 mg | ORAL_TABLET | Freq: Four times a day (QID) | ORAL | Status: DC | PRN
Start: 2014-01-10 — End: 2014-01-12

## 2014-01-10 MED ORDER — LIDOCAINE HCL (CARDIAC) 20 MG/ML IV SOLN
INTRAVENOUS | Status: AC
  Filled 2014-01-10: qty 5

## 2014-01-10 MED ORDER — PROPRANOLOL HCL ER 80 MG PO CP24
80.0000 mg | ORAL_CAPSULE | Freq: Every day | ORAL | Status: DC
  Administered 2014-01-11: 80 mg via ORAL
  Filled 2014-01-10 (×2): qty 1

## 2014-01-10 MED ORDER — HYDROMORPHONE HCL PF 1 MG/ML IJ SOLN
INTRAMUSCULAR | Status: AC
  Filled 2014-01-10: qty 1

## 2014-01-10 MED ORDER — METHOCARBAMOL 500 MG PO TABS: 500.0000 mg | ORAL_TABLET | Freq: Four times a day (QID) | ORAL | Status: DC

## 2014-01-10 MED ORDER — METHOCARBAMOL 500 MG PO TABS
500.0000 mg | ORAL_TABLET | Freq: Four times a day (QID) | ORAL | Status: DC | PRN
  Administered 2014-01-10 – 2014-01-11 (×3): 500 mg via ORAL
  Filled 2014-01-10 (×3): qty 1

## 2014-01-10 MED ORDER — MIDAZOLAM HCL 2 MG/2ML IJ SOLN
INTRAMUSCULAR | Status: AC
  Filled 2014-01-10: qty 2

## 2014-01-10 MED ORDER — METHOCARBAMOL 100 MG/ML IJ SOLN
500.0000 mg | Freq: Four times a day (QID) | INTRAVENOUS | Status: DC | PRN
  Filled 2014-01-10: qty 5

## 2014-01-10 MED ORDER — BUPIVACAINE HCL (PF) 0.25 % IJ SOLN
INTRAMUSCULAR | Status: DC | PRN
  Administered 2014-01-10: 10 mL

## 2014-01-10 MED ORDER — OXYCODONE-ACETAMINOPHEN 5-325 MG PO TABS
ORAL_TABLET | ORAL | Status: AC
  Filled 2014-01-10: qty 1

## 2014-01-10 MED ORDER — CEFAZOLIN SODIUM 1-5 GM-% IV SOLN
1.0000 g | INTRAVENOUS | Status: DC
  Filled 2014-01-10: qty 50

## 2014-01-10 MED ORDER — DOCUSATE SODIUM 100 MG PO CAPS: 100.0000 mg | ORAL_CAPSULE | Freq: Two times a day (BID) | ORAL | Status: DC

## 2014-01-10 MED ORDER — FENTANYL CITRATE 0.05 MG/ML IJ SOLN
INTRAMUSCULAR | Status: DC | PRN
  Administered 2014-01-10: 75 ug via INTRAVENOUS
  Administered 2014-01-10 (×2): 50 ug via INTRAVENOUS
  Administered 2014-01-10: 25 ug via INTRAVENOUS
  Administered 2014-01-10: 50 ug via INTRAVENOUS

## 2014-01-10 MED ORDER — PROPRANOLOL HCL 80 MG PO TABS: 80.0000 mg | ORAL_TABLET | ORAL | Status: DC

## 2014-01-10 MED ORDER — OXYCODONE-ACETAMINOPHEN 5-325 MG PO TABS: 1.0000 | ORAL_TABLET | ORAL | Status: DC | PRN

## 2014-01-10 MED ORDER — KCL IN DEXTROSE-NACL 20-5-0.45 MEQ/L-%-% IV SOLN
INTRAVENOUS | Status: DC
  Administered 2014-01-10: 23:00:00 via INTRAVENOUS
  Filled 2014-01-10 (×3): qty 1000

## 2014-01-10 MED ORDER — ONDANSETRON HCL 4 MG/2ML IJ SOLN
INTRAMUSCULAR | Status: DC | PRN
  Administered 2014-01-10: 4 mg via INTRAVENOUS

## 2014-01-10 MED ORDER — VITAMIN C 500 MG PO TABS: 500.0000 mg | ORAL_TABLET | Freq: Every day | ORAL | Status: DC

## 2014-01-10 MED ORDER — PROPOFOL 10 MG/ML IV BOLUS
INTRAVENOUS | Status: AC
  Filled 2014-01-10: qty 20

## 2014-01-10 MED ORDER — VITAMIN C 500 MG PO TABS
1000.0000 mg | ORAL_TABLET | Freq: Every day | ORAL | Status: DC
Start: 2014-01-10 — End: 2014-01-12
  Administered 2014-01-10 – 2014-01-11 (×2): 1000 mg via ORAL
  Filled 2014-01-10 (×3): qty 2

## 2014-01-10 MED ORDER — ALBUTEROL SULFATE (2.5 MG/3ML) 0.083% IN NEBU
3.0000 mL | INHALATION_SOLUTION | Freq: Four times a day (QID) | RESPIRATORY_TRACT | Status: DC | PRN
  Filled 2014-01-10: qty 3

## 2014-01-10 MED ORDER — OXYCODONE HCL 5 MG PO TABS
ORAL_TABLET | ORAL | Status: AC
  Filled 2014-01-10: qty 1

## 2014-01-10 MED ORDER — 0.9 % SODIUM CHLORIDE (POUR BTL) OPTIME
TOPICAL | Status: DC | PRN
  Administered 2014-01-10: 1000 mL

## 2014-01-10 MED ORDER — LIDOCAINE HCL (CARDIAC) 20 MG/ML IV SOLN
INTRAVENOUS | Status: DC | PRN
  Administered 2014-01-10: 40 mg via INTRAVENOUS

## 2014-01-10 MED ORDER — ONDANSETRON HCL 4 MG/2ML IJ SOLN
INTRAMUSCULAR | Status: AC
  Filled 2014-01-10: qty 2

## 2014-01-10 SURGICAL SUPPLY — 51 items
APL SKNCLS STERI-STRIP NONHPOA (GAUZE/BANDAGES/DRESSINGS)
BANDAGE ELASTIC 3 VELCRO ST LF (GAUZE/BANDAGES/DRESSINGS) ×5 IMPLANT
BANDAGE ELASTIC 4 VELCRO ST LF (GAUZE/BANDAGES/DRESSINGS) IMPLANT
BANDAGE GAUZE ELAST BULKY 4 IN (GAUZE/BANDAGES/DRESSINGS) ×3 IMPLANT
BENZOIN TINCTURE PRP APPL 2/3 (GAUZE/BANDAGES/DRESSINGS) IMPLANT
BIT DRILL 2.2 SS TIBIAL (BIT) ×2 IMPLANT
BLADE SURG ROTATE 9660 (MISCELLANEOUS) IMPLANT
COVER SURGICAL LIGHT HANDLE (MISCELLANEOUS) ×3 IMPLANT
CUFF TOURNIQUET SINGLE 18IN (TOURNIQUET CUFF) ×2 IMPLANT
CUFF TOURNIQUET SINGLE 24IN (TOURNIQUET CUFF) IMPLANT
DRAPE OEC MINIVIEW 54X84 (DRAPES) ×3 IMPLANT
DRSG EMULSION OIL 3X3 NADH (GAUZE/BANDAGES/DRESSINGS) ×2 IMPLANT
GAUZE XEROFORM 1X8 LF (GAUZE/BANDAGES/DRESSINGS) IMPLANT
GLOVE BIOGEL PI IND STRL 8.5 (GLOVE) ×2 IMPLANT
GLOVE BIOGEL PI INDICATOR 8.5 (GLOVE) ×1
GLOVE SURG ORTHO 8.0 STRL STRW (GLOVE) ×3 IMPLANT
GOWN STRL NON-REIN LRG LVL3 (GOWN DISPOSABLE) ×8 IMPLANT
K-WIRE 1.6 (WIRE) ×3
K-WIRE FX5X1.6XNS BN SS (WIRE) ×2
KIT BASIN OR (CUSTOM PROCEDURE TRAY) ×3 IMPLANT
KIT ROOM TURNOVER OR (KITS) ×3 IMPLANT
KWIRE FX5X1.6XNS BN SS (WIRE) ×1 IMPLANT
NDL HYPO 25GX1X1/2 BEV (NEEDLE) IMPLANT
NEEDLE HYPO 25GX1X1/2 BEV (NEEDLE) ×3 IMPLANT
NS IRRIG 1000ML POUR BTL (IV SOLUTION) ×3 IMPLANT
PACK ORTHO EXTREMITY (CUSTOM PROCEDURE TRAY) ×3 IMPLANT
PAD ARMBOARD 7.5X6 YLW CONV (MISCELLANEOUS) ×6 IMPLANT
PAD CAST 3X4 CTTN HI CHSV (CAST SUPPLIES) ×2 IMPLANT
PADDING CAST COTTON 3X4 STRL (CAST SUPPLIES) ×6
PEG LOCKING SMOOTH 2.2X16 (Screw) ×2 IMPLANT
PEG LOCKING SMOOTH 2.2X18 (Peg) ×4 IMPLANT
PEG LOCKING SMOOTH 2.2X20 (Screw) ×8 IMPLANT
PLATE DVR CROSSLOCK STD RT (Plate) ×2 IMPLANT
SCREW LOCK 14X2.7X 3 LD TPR (Screw) ×3 IMPLANT
SCREW LOCK 16X2.7X 3 LD TPR (Screw) ×1 IMPLANT
SCREW LOCKING 2.7X14 (Screw) ×9 IMPLANT
SCREW LOCKING 2.7X15MM (Screw) ×2 IMPLANT
SCREW LOCKING 2.7X16 (Screw) ×3 IMPLANT
SPONGE GAUZE 4X4 12PLY (GAUZE/BANDAGES/DRESSINGS) ×2 IMPLANT
STRIP CLOSURE SKIN 1/2X4 (GAUZE/BANDAGES/DRESSINGS) IMPLANT
SUT ETHILON 4 0 P 3 18 (SUTURE) IMPLANT
SUT ETHILON 5 0 P 3 18 (SUTURE)
SUT NYLON ETHILON 5-0 P-3 1X18 (SUTURE) IMPLANT
SUT PROLENE 4 0 P 3 18 (SUTURE) ×2 IMPLANT
SUT PROLENE 4 0 PS 2 18 (SUTURE) ×2 IMPLANT
SUT VIC AB 2-0 FS1 27 (SUTURE) ×2 IMPLANT
SUT VIC AB 4-0 PS2 27 (SUTURE) ×2 IMPLANT
SYR CONTROL 10ML LL (SYRINGE) ×2 IMPLANT
TOWEL OR 17X24 6PK STRL BLUE (TOWEL DISPOSABLE) ×3 IMPLANT
TOWEL OR 17X26 10 PK STRL BLUE (TOWEL DISPOSABLE) ×3 IMPLANT
WATER STERILE IRR 1000ML POUR (IV SOLUTION) ×1 IMPLANT

## 2014-01-10 NOTE — Anesthesia Procedure Notes (Signed)
Procedure Name: LMA Insertion Date/Time: 01/10/2014 2:11 PM Performed by: Melina Copa, DAVID R Pre-anesthesia Checklist: Patient identified, Emergency Drugs available, Suction available, Patient being monitored and Timeout performed Patient Re-evaluated:Patient Re-evaluated prior to inductionOxygen Delivery Method: Circle system utilized Preoxygenation: Pre-oxygenation with 100% oxygen Intubation Type: IV induction Ventilation: Mask ventilation without difficulty LMA: LMA inserted LMA Size: 4.0 Number of attempts: 1 Placement Confirmation: positive ETCO2 and breath sounds checked- equal and bilateral Tube secured with: Tape Dental Injury: Teeth and Oropharynx as per pre-operative assessment

## 2014-01-10 NOTE — Transfer of Care (Signed)
Immediate Anesthesia Transfer of Care Note  Patient: Briana [name]  Procedure(s) Performed: Procedure(s): OPEN REDUCTION INTERNAL FIXATION (ORIF) DISTAL RADIAL FRACTURE (Right)  Patient Location: PACU  Anesthesia Type:General  Level of Consciousness: awake, alert  and oriented  Airway & Oxygen Therapy: Patient Spontanous Breathing and Patient connected to nasal cannula oxygen  Post-op Assessment: Report given to PACU RN, Post -op Vital signs reviewed and stable and Patient moving all extremities  Post vital signs: Reviewed and stable  Complications: No apparent anesthesia complications

## 2014-01-10 NOTE — Brief Op Note (Signed)
01/10/2014  1:57 PM  PATIENT:  Briana [name]  80 y.o. female  PRE-OPERATIVE DIAGNOSIS:  right distal radius fracture  POST-OPERATIVE DIAGNOSIS:  right distal radius fracture  PROCEDURE:  Procedure(s): RIGHT DISTAL RADIUS CLOSED REDUCTION AND PINNING WITH POSSIBLE ORIF (Right)  SURGEON:  Surgeon(s) and Role:    * Linna Hoff, MD - Primary  PHYSICIAN ASSISTANT: none  ASSISTANTS: none   ANESTHESIA:   general  EBL:     BLOOD ADMINISTERED:none  DRAINS: none   LOCAL MEDICATIONS USED:  MARCAINE     SPECIMEN:  No Specimen  DISPOSITION OF SPECIMEN:  N/A  COUNTS:  YES  TOURNIQUET:    DICTATION: .Other Dictation: Dictation Number [id number]  PLAN OF CARE: Admit for overnight observation  PATIENT DISPOSITION:  PACU - hemodynamically stable.   Delay start of Pharmacological VTE agent (>24hrs) due to surgical blood loss or risk of bleeding: not applicable

## 2014-01-10 NOTE — Brief Op Note (Signed)
01/10/2014  3:44 PM  PATIENT:  Briana [name]  80 y.o. female  PRE-OPERATIVE DIAGNOSIS:  right distal radius fracture  POST-OPERATIVE DIAGNOSIS:  right distal radius fracture  PROCEDURE:  Procedure(s): OPEN REDUCTION INTERNAL FIXATION (ORIF) DISTAL RADIAL FRACTURE (Right)  SURGEON:  Surgeon(s) and Role:    * Linna Hoff, MD - Primary  PHYSICIAN ASSISTANT: none  ASSISTANTS: none   ANESTHESIA:   general  EBL:  Total I/O In: 550 [I.V.:550] Out: -   BLOOD ADMINISTERED:none and 0 CC PRBC  DRAINS: none   LOCAL MEDICATIONS USED:  MARCAINE     SPECIMEN:  No Specimen  DISPOSITION OF SPECIMEN:  N/A  COUNTS:  YES  TOURNIQUET:   Total Tourniquet Time Documented: Upper Arm (Right) - 33 minutes Total: Upper Arm (Right) - 33 minutes   DICTATION: .Other Dictation: Dictation Number typed  PLAN OF CARE: Admit for overnight observation  PATIENT DISPOSITION:  PACU - hemodynamically stable.   Delay start of Pharmacological VTE agent (>24hrs) due to surgical blood loss or risk of bleeding: not applicable

## 2014-01-10 NOTE — Progress Notes (Signed)
Pharmacy Medication Reconciliation Comment:  Per Dr. Idell Pickles note on 12/26/2013, patient was on Inderal 80 mg LA once daily for Essential Tremor. Medication recorded and re-ordered in hospital was Inderal 80mg  IR once daily. Medication was changed to LA formulation to reflect accurate medication.  Please be sure to address prior to discharge as this may have affected the discharge orders. Thank you.   Oval Linsey. Leitha Schuller, PharmD Clinical Pharmacist - Resident Phone: [phone] Pager: [phone] 01/10/2014 5:34 PM

## 2014-01-10 NOTE — Anesthesia Preprocedure Evaluation (Signed)
Anesthesia Evaluation  Patient identified by MRN, date of birth, ID band Patient awake    Reviewed: Allergy & Precautions, H&P , NPO status , Patient's Chart, lab work & pertinent test results, reviewed documented beta blocker date and time   Airway Mallampati: II TM Distance: >3 FB Neck ROM: Full    Dental no notable dental hx. (+) Teeth Intact, Dental Advisory Given   Pulmonary neg pulmonary ROS,  breath sounds clear to auscultation  Pulmonary exam normal       Cardiovascular hypertension, On Medications and On Home Beta Blockers Rhythm:Regular Rate:Normal     Neuro/Psych negative neurological ROS  negative psych ROS   GI/Hepatic Neg liver ROS, GERD-  Medicated and Controlled,  Endo/Other  negative endocrine ROSneg diabetes  Renal/GU negative Renal ROS  negative genitourinary   Musculoskeletal   Abdominal   Peds  Hematology negative hematology ROS (+)   Anesthesia Other Findings   Reproductive/Obstetrics negative OB ROS                           Anesthesia Physical Anesthesia Plan  ASA: II  Anesthesia Plan: General   Post-op Pain Management:    Induction: Intravenous  Airway Management Planned: LMA  Additional Equipment:   Intra-op Plan:   Post-operative Plan: Extubation in OR  Informed Consent: I have reviewed the patients History and Physical, chart, labs and discussed the procedure including the risks, benefits and alternatives for the proposed anesthesia with the patient or authorized representative who has indicated his/her understanding and acceptance.   Dental advisory given  Plan Discussed with: CRNA  Anesthesia Plan Comments:         Anesthesia Quick Evaluation

## 2014-01-10 NOTE — Op Note (Signed)
OPERATIVE REPORT   POSTOPERATIVE DIAGNOSIS: Right wrist comminuted intra-articular distal radius  Fracture of 3 or more fragments  POSTOPERATIVE DIAGNOSIS: Right wrist comminuted intra-articular distal  radius fracture of 3 or more fragments.   ATTENDING PHYSICIAN: Dr. Linna Hoff IV who scrubbed and present for  the entire procedure.   ASSISTANT SURGEON: None.   ANESTHESIA: GENERAL VIA LMA   TOURNIQUET TIME: Less than 1 hour at 250 mmHg.   SURGICAL PROCEDURES:   1. Right wrist open reduction and internal fixation of comminuted intraarticular fracture of 3 or more fragments distal radius fracture.  2. Right wrist brachioradialis tendon release, tendon tenotomy.  3. Right wrist radiograph 3 views, right wrist.   SURGICAL IMPLANTS: Biomet Crosslock standard plate with distal locking pegs  in combination of locking and nonlocking screws proximally.   SURGICAL INDICATIONS: Briana [name] is a 80 year old female who  sustained a comminuted intr-articular distal radius fracture,  displaced and angulated. Patient was seen and evaluated in the office and  based on his high degree of comminution recommended undergo the above  procedure. Risks, benefits, and alternatives were discussed in detail  with patient and signed informed consent was obtained. Risks include,  but not limited to bleeding, infection, damage to nearby nerves,  arteries, or tendons, loss of motion of wrist and digits, incomplete  relief of symptoms, nonunion, malunion, hardware failure, and need for  further surgical intervention.   DESCRIPTION OF PROCEDURE: Patient was properly identified in the  preoperative holding area and marked with a permanent marker made on  right wrist to indicate the correct operative site. Patient was then  brought back to the operating room, placed supine on the anesthesia  table. General anesthesia was administered. Patient tolerated this  well. A well-padded tourniquet was then  placed on right brachium and  sealed with 1000 drape. Right upper extremity was then prepped and  draped in normal sterile fashion. A time-out was called, correct side  was identified, procedure was then begun. Attention was then turned to  the right wrist. Attempted closed manipulation was done and not Successful in achieving stable reduction.  A longitudinal was made directly over the FCR sheath.  Limb was then elevated and tourniquet insufflated. Patient received  preoperative antibiotics prior to skin incision. Time-out was called,  correct side was identified, and procedure was then begun. Several cm  incision was made directly over the FCR sheath. Dissection was carried  down through the skin and subcutaneous tissue. The FCR sheath was then  opened. Going through the floor of the FCR sheath, the FPL was then  carefully identified and the pronator quadratus was then identified and  opened in an L-shaped fashion. This exposed the comminuted intra  Articular fracture of 3 or more fragments.  Following this,the brachioradialis was then carefully released and first protection of  first dorsal compartment tendons was done and exposed the  comminuted radial column.  An open reduction was then performed and then  once this was carefully done, the volar plate was then applied. Given  the size of the radius, a standard plate was then chosen. This confirmed  using the mini C-arm. Following this, the plate was then applied on the  volar surface after the open reduction of the intra-articular fragments  were then carried out, and then held in place with a K-wire. The oblong  screw hole was then placed. Plate height was adjusted. Following this,  proximal fixation was then carried out from an  ulnar to radial direction  with locking pegs. Following this, radiographs were obtained which  showed good reduction and good placement of the subchondral bone. The  distal fixation was then carried out.  The distal locking pegs were used.  Final fixation was then carried out proximally with a combination of locking  and nonlocking screws within the radial shaft. Wound was then  thoroughly irrigated. Copious wound irrigation was done. Pronator  quadratus was then closed with 2-0 Vicryl. Subcutaneous tissues closed  with 4-0 Vicryl and the skin was closed with simple 4-0 Prolene. A 10  mL 0.25% Marcaine infiltrated locally. Adaptic dressing, sterile  compressive bandage was then applied. Stress examination of distal  radioulnar joint, SL interval, and LT interval does not show any enter  of ligaments or instability of the wrist. After the sugar-tong splint  was then applied, patient was explained and taken to recovery room in  good condition.   Radiographs: AP/LATERAL AND OBLIQUE, INTRAOP INTERPRETATION 3 view of wrist show internal fixation in place in good position with plate in place   Post op Plan: Admission for iv antibiotics  Pain control  F/u in office in 2 weeks for xrays out of splint and then short arm cast  Short arm cast for 2 weeks and then xrays out of cast and then begin OT.

## 2014-01-10 NOTE — Discharge Instructions (Signed)
KEEP BANDAGE CLEAN AND DRY CALL OFFICE FOR F/U APPT [phone] Dr. Caralyn Guile cell phone [phone] KEEP HAND ELEVATED ABOVE HEART OK TO APPLY ICE TO OPERATIVE AREA CONTACT OFFICE IF ANY WORSENING PAIN OR CONCERNS.

## 2014-01-10 NOTE — Preoperative (Signed)
Beta Blockers   Reason not to administer Beta Blockers:Not Applicable 

## 2014-01-11 NOTE — Progress Notes (Signed)
PT SEEN/EXAMINED PT RELUCTANT ABOUT GOING HOME TODAY WITH WEATHER AND PAIN LEVEL WILL CONTINUE INPATIENT CARE FOR IV PAIN MEDS  OT/PT EVAL AWAKE,ALERT NONTOXIC GOOD DIGITAL MOBILITY

## 2014-01-11 NOTE — Progress Notes (Signed)
PT Cancellation Note  Patient Details Name: Briana [name] MRN: [id number] DOB: [birth year]   Cancelled Treatment: OT screened pt and recommended PT eval. At the time of PT eval, pt declined participating as she just got her meal tray. Pt and brother-in-law report she is at her baseline of function mobilizing around the room with a SPC, however would like instruction on stair negotiation prior to d/c (anticipated tomorrow morning). Will attempt again prior to d/c.   Jolyn Lent 01/11/2014, 5:10 PM  Jolyn Lent, [place], DPT [phone]

## 2014-01-11 NOTE — Progress Notes (Signed)
Occupational Therapy Evaluation Patient Details Name: Briana [name] MRN: [id number] DOB: 1934-07-23 Today's Date: 01/11/2014 Time: [id number] OT Time Calculation (min): 33 min  OT Assessment / Plan / Recommendation History of present illness s/p RUE open reduction internal fixation for distal radius fracture following a fall when pt slipped on ice Saturday morning   Clinical Impression   PTA pt reports that she was staying/assisting a friend prior to her fall. Following her fall, pt states that she was staying at home and was Mod I in ADLs and mobility (single point cane). Completed education regarding UB dressing, using her cane in her LUE during ambulation, and RUE exercise and purpose to increase/maintain ROM in uninvolved fingers and shoulder joints respectively. Pt states that she plans to return home and has a daughter who lives in [place] and can help, but is not available 24/7. Pt presents with decreased ROM, increased R hand edema, increased pain. Pt had difficulty remembering compensatory techniques for UB ADLs. Acute OT to address sequencing for ADLs with compensatory techniques.     OT Assessment  Patient needs continued OT Services    Follow Up Recommendations  Home health OT    Barriers to Discharge Decreased caregiver support    Equipment Recommendations  3 in 1 bedside comode    Recommendations for Other Services PT consult  Frequency  Min 2X/week    Precautions / Restrictions Precautions Precautions: Fall Restrictions Weight Bearing Restrictions: Yes (NWB)   Pertinent Vitals/Pain Pt reported pain but did not provide number. States it is better today. Repositioned pt in bed to assist with pain.     ADL  Eating/Feeding: Independent Where Assessed - Eating/Feeding: Chair Grooming: Supervision/safety;Set up (due to pt using LUE for cane; unable to carry items) Where Assessed - Grooming: Supported sitting Upper Body Bathing: Min A Where Assessed - Upper Body Bathing:  Supported sitting Lower Body Bathing: Min guard Where Assessed - Lower Body Bathing: Supported sit to stand Upper Body Dressing: Minimal assistance Where Assessed - Upper Body Dressing: Supported sitting Lower Body Dressing: Min A Where Assessed - Lower Body Dressing: Supported sit to Lobbyist: Supervision/safety Armed forces technical officer Method: Sit to stand Toileting - Water quality scientist and Hygiene: Min guard Where Assessed - Best boy and Hygiene: Standing Transfers/Ambulation Related to ADLs: pt reports feeling "85%" back to normal with her balance and ambulation ADL Comments: pt had trouble remembering sequencing for UB dressing; Educated pt on wearing loose clothing such as large t-shirts and house gowns to make UB dressing easier    OT Diagnosis: Generalized weakness;Acute pain  OT Problem List: Decreased strength;Decreased range of motion;Decreased activity tolerance;Decreased safety awareness;Pain;Decreased knowledge of use of DME or AE OT Treatment Interventions: Self-care/ADL training;Therapeutic exercise;Patient/family education;DME and/or AE instruction   OT Goals(Current goals can be found in the care plan section) Acute Rehab OT Goals Patient Stated Goal: home tomorrow OT Goal Formulation: With patient Time For Goal Achievement: 01/18/14 Potential to Achieve Goals: Good ADL Goals Pt Will Perform Upper Body Dressing: with modified independence (without vc's for compensatory techniques) Pt Will Perform Toileting - Clothing Manipulation and hygiene: with supervision;sit to/from stand Pt Will Perform Tub/Shower Transfer: with supervision;3 in 1;ambulating (and use of single point cane) Pt/caregiver will Perform Home Exercise Program: Right Upper extremity;Increased ROM;Increased strength;Independently (for shoulder and hand) Additional ADL Goal #1: Pt will perform bed mobility supine>sit with supervision using LUE to push up  Visit Information   Last OT Received On: 01/11/14 Assistance Needed: +1 History of  Present Illness: s/p RUE open reduction internal fixation for distal radius fracture following a fall when pt slipped on ice Saturday morning       Prior [place] expects to be discharged to:: Private residence Living Arrangements: Alone Available Help at Discharge: Family (pt has daughter who lives in [place] who can assist, but will not be available 24/7) Type of Home: House Home Access: Stairs to enter [employer] of Steps: 3 Entrance Stairs-Rails: Left Home Layout: One [place]: [place] - single point Prior Function Level of Independence: Independent with assistive device(s) Comments: Independent for ADLs; Mod I for mobility with single point cane Communication Communication: HOH         Vision/Perception Vision - History Patient Visual Report: No change from baseline   Cognition  Cognition Arousal/Alertness: Lethargic Behavior During Therapy: WFL for tasks assessed/performed Overall Cognitive Status: Impaired/Different from baseline Area of Impairment: Problem solving Problem Solving: Difficulty sequencing (for UB dressing)    Extremity/Trunk Assessment Upper Extremity Assessment Upper Extremity Assessment: RUE deficits/detail;Generalized weakness (pt fatigued quickly during RUE shoulder exercises)     Mobility Bed Mobility Overal bed mobility: Needs Assistance Bed Mobility: Supine to Sit Supine to sit: Min A due to decreased strength Transfers Overall transfer level: Needs assistance Equipment used: Straight cane Transfers: Sit to/from Stand Sit to Stand: Supervision     Exercise General Exercises - Upper Extremity Shoulder Flexion: AROM;Right;10 reps;Supine;Seated Shoulder Horizontal ABduction: AROM;Right;10 reps;Supine Shoulder Horizontal ADduction: AROM;Right;10 reps;Supine Digit Composite Flexion: AROM;Right;10  reps;Supine;Seated Composite Extension: AROM;Right;10 reps;Supine;Seated Educated pt on completing exercises 10 times for every waking hour.      End of Session OT - End of Session Equipment Utilized During Treatment: Gait belt (single point cane (pt's from home)) Activity Tolerance: Patient limited by lethargy;Patient limited by fatigue Patient left: in bed;with call bell/phone within reach  GO Functional Limitation: Self care Self Care Current Status [phone]): At least 20 percent but less than 40 percent impaired, limited or restricted Self Care Goal Status [id number]): At least 1 percent but less than 20 percent impaired, limited or restricted   Juluis Rainier [id number] 01/11/2014, 3:13 PM

## 2014-01-11 NOTE — Anesthesia Postprocedure Evaluation (Signed)
  Anesthesia Post-op Note  Patient: Briana [name]  Procedure(s) Performed: Procedure(s): OPEN REDUCTION INTERNAL FIXATION (ORIF) DISTAL RADIAL FRACTURE (Right)  Patient Location: Nursing unit  Anesthesia Type:General  Level of Consciousness: awake, alert , oriented and patient cooperative  Airway and Oxygen Therapy: Patient Spontanous Breathing  Post-op Pain: mild  Post-op Assessment: Post-op Vital signs reviewed, Patient's Cardiovascular Status Stable, Respiratory Function Stable, Patent Airway, No signs of Nausea or vomiting, Adequate PO intake and Pain level controlled  Post-op Vital Signs: Reviewed and stable  Complications: No apparent anesthesia complications

## 2014-01-12 ENCOUNTER — Encounter (HOSPITAL_COMMUNITY): Payer: Self-pay | Admitting: Orthopedic Surgery

## 2014-01-12 MED ORDER — OXYCODONE-ACETAMINOPHEN 5-325 MG PO TABS: 1.0000 | ORAL_TABLET | ORAL | Status: DC | PRN

## 2014-01-12 NOTE — Progress Notes (Signed)
Pt discharged to home. D/c instructions given. No questions verbalized. Vitals stable.

## 2014-01-12 NOTE — Care Management Note (Signed)
CARE MANAGEMENT NOTE 01/12/2014  Patient:  ARBOR, STORMONT   Account Number:  [id number]  Date Initiated:  01/12/2014  Documentation initiated by:  Ricki Miller  Subjective/Objective Assessment:   80 yr old female s/p right wrist ORIF     Action/Plan:   Patient was discharged prior to therapist making reqest for [facility]. CM will attempt to reach patient to arrange.   Anticipated DC Date:  01/12/2014   Anticipated DC Plan:  [place]  CM consult      [facility] Choice  HOME HEALTH   Choice offered to / List presented to:             Status of service:  In process, will continue to follow Medicare Important Message given?   (If response is "NO", the following Medicare IM given date fields will be blank) Date Medicare IM given:   Date Additional Medicare IM given:    Discharge Disposition:    Per UR Regulation:    If discussed at Long Length of Stay Meetings, dates discussed:    Comments:

## 2014-01-12 NOTE — Discharge Summary (Signed)
Physician Discharge Summary  Patient ID: Briana [name] MRN: [id number] DOB/AGE: 1934-03-11 80 y.o.  Admit date: 01/10/2014 Discharge date: 01/12/2014  Admission Diagnoses: right distal radius fracture Past Medical History  Diagnosis Date  . Hypertension   . Diverticulitis   . Hyperlipidemia   . Pre-diabetes   . Vitamin D deficiency   . DJD (degenerative joint disease)   . DDD (degenerative disc disease)   . Osteoporosis   . Heart murmur   . Pneumonia   . Diabetes mellitus without complication     "pre-diabetes"  . Varicose veins   . GERD (gastroesophageal reflux disease)     takes ranitidine  . Cancer     skin cancer  . Hx of irritable bowel syndrome   . Occasional tremors     Discharge Diagnoses:  Active Problems:   Fracture of right radius   Surgeries: Procedure(s): OPEN REDUCTION INTERNAL FIXATION (ORIF) DISTAL RADIAL FRACTURE on 01/10/2014    Consultants:    Discharged Condition: Improved  Hospital Course: Briana [name] is an 80 y.o. female who was admitted 01/10/2014 with a chief complaint of No chief complaint on file. , and found to have a diagnosis of right distal radius fracture.  They were brought to the operating room on 01/10/2014 and underwent Procedure(s): OPEN REDUCTION INTERNAL FIXATION (ORIF) DISTAL RADIAL FRACTURE.    They were given perioperative antibiotics: Anti-infectives   Start     Dose/Rate Route Frequency Ordered Stop   01/10/14 2200  ceFAZolin (ANCEF) IVPB 1 g/50 mL premix  Status:  Discontinued     1 g 100 mL/hr over 30 Minutes Intravenous NOW 01/10/14 1702 01/10/14 1721   01/10/14 2200  ceFAZolin (ANCEF) IVPB 1 g/50 mL premix     1 g 100 mL/hr over 30 Minutes Intravenous 3 times per day 01/10/14 1702     01/10/14 0600  ceFAZolin (ANCEF) IVPB 2 g/50 mL premix     2 g 100 mL/hr over 30 Minutes Intravenous On call to O.R. 01/09/14 1416 01/10/14 1402    .  They were given sequential compression devices, early ambulation, and Other  (comment)AMBULATION for DVT prophylaxis.  Recent vital signs: Patient Vitals for the past 24 hrs:  BP Temp Temp src Pulse Resp SpO2  01/12/14 0611 128/65 mmHg 98.8 F (37.1 C) - 64 16 91 %  01/11/14 2040 146/72 mmHg 98.4 F (36.9 C) - 74 18 91 %  01/11/14 1300 132/65 mmHg 99.3 F (37.4 C) Oral 70 18 96 %  .  Recent laboratory studies: Dg Chest 2 View  01/10/2014   CLINICAL DATA:  Preoperative for wrist surgery.  Hypertension.  EXAM: CHEST  2 VIEW  COMPARISON:  DG CHEST 2 VIEW dated 12/12/2012; CT CHEST W/O CM dated 12/05/2004  FINDINGS: Calcified granuloma, right lower lobe. Mild atherosclerotic calcification of the aortic arch.  Mild prominence of epidural scratch of mild prominence of epicardial adipose tissue.  The lungs appear otherwise clear. Mild thoracic spondylosis. No pleural effusion.  IMPRESSION: 1. No acute findings. 2. Old granulomatous disease. 3. Thoracic spondylosis. 4. Mild atherosclerotic calcification of the aortic arch.   Electronically Signed   By: Sherryl Barters M.D.   On: 01/10/2014 11:12    Discharge Medications:     Medication List         acetaminophen 650 MG CR tablet  Commonly known as:  TYLENOL  Take 650 mg by mouth every 8 (eight) hours as needed for pain.     albuterol 108 (90  BASE) MCG/ACT inhaler  Commonly known as:  PROVENTIL HFA;VENTOLIN HFA  Inhale 2 puffs into the lungs every 6 (six) hours as needed for wheezing or shortness of breath.     aspirin 81 MG tablet  Take 81 mg by mouth daily.     b complex vitamins tablet  Take 1 tablet by mouth daily.     bumetanide 1 MG tablet  Commonly known as:  BUMEX  Take 0.5 mg by mouth 2 (two) times daily.     diazepam 5 MG tablet  Commonly known as:  VALIUM  Take 5 mg by mouth every 6 (six) hours as needed for anxiety.     docusate sodium 100 MG capsule  Commonly known as:  COLACE  Take 1 capsule (100 mg total) by mouth 2 (two) times daily.     MAGNESIUM PO  Take 1 tablet by mouth daily.      methocarbamol 500 MG tablet  Commonly known as:  ROBAXIN  Take 1 tablet (500 mg total) by mouth 4 (four) times daily.     OSTEO BI-FLEX JOINT SHIELD PO  Take by mouth 2 (two) times daily.     oxyCODONE-acetaminophen 5-325 MG per tablet  Commonly known as:  PERCOCET/ROXICET  Take 1-2 tablets by mouth every 6 (six) hours as needed for severe pain.     oxyCODONE-acetaminophen 5-325 MG per tablet  Commonly known as:  ROXICET  Take 1 tablet by mouth every 4 (four) hours as needed for moderate pain or severe pain.     PRESCRIPTION MEDICATION  Take 1 tablet by mouth 2 (two) times daily.     PRESCRIPTION MEDICATION  Take 1 tablet by mouth 3 (three) times daily as needed (for tremors).     propranolol ER 80 MG 24 hr capsule  Commonly known as:  INDERAL LA  Take 80 mg by mouth daily.     ranitidine 300 MG tablet  Commonly known as:  ZANTAC  Take 300 mg by mouth 2 (two) times daily.     tamoxifen 20 MG tablet  Commonly known as:  NOLVADEX  Take 20 mg by mouth daily.     traMADol 50 MG tablet  Commonly known as:  ULTRAM  Take by mouth every 6 (six) hours as needed (1 every 6-8 hours as needed).     vitamin C 500 MG tablet  Commonly known as:  ASCORBIC ACID  Take 1 tablet (500 mg total) by mouth daily.     Vitamin D3 2000 UNITS Tabs  Take 2,000-4,000 Units by mouth 2 (two) times daily. Take 4000 units in the morning and 2000 units in the evening        Diagnostic Studies: Dg Chest 2 View  01/10/2014   CLINICAL DATA:  Preoperative for wrist surgery.  Hypertension.  EXAM: CHEST  2 VIEW  COMPARISON:  DG CHEST 2 VIEW dated 12/12/2012; CT CHEST W/O CM dated 12/05/2004  FINDINGS: Calcified granuloma, right lower lobe. Mild atherosclerotic calcification of the aortic arch.  Mild prominence of epidural scratch of mild prominence of epicardial adipose tissue.  The lungs appear otherwise clear. Mild thoracic spondylosis. No pleural effusion.  IMPRESSION: 1. No acute findings. 2. Old  granulomatous disease. 3. Thoracic spondylosis. 4. Mild atherosclerotic calcification of the aortic arch.   Electronically Signed   By: Sherryl Barters M.D.   On: 01/10/2014 11:12   Dg Lumbar Spine Complete  01/06/2014   CLINICAL DATA:  Fall.  Low back injury and pain.  EXAM:  LUMBAR SPINE - COMPLETE 4+ VIEW  COMPARISON:  None.  FINDINGS: There is no evidence of lumbar spine fracture. Alignment is normal. Mild to moderate degenerative disc disease is seen at levels of L4-5 and L5-S1. Mild bilateral facet DJD also seen at these levels.  Generalized osteopenia noted. No focal lytic or sclerotic bone lesions identified. Left hip prosthesis incidentally noted.  IMPRESSION: No acute findings. Degenerative spondylosis, as described above.   Electronically Signed   By: Earle Gell M.D.   On: 01/06/2014 11:37   Dg Wrist Complete Right  01/06/2014   CLINICAL DATA:  Fall.  Wrist injury and pain.  EXAM: RIGHT WRIST - COMPLETE 3+ VIEW  COMPARISON:  None.  FINDINGS: Comminuted fracture of the distal radius is seen with intra-articular extension. There is mild dorsal angulation of the distal articular surface of the radius. Associated ulnar styloid process fracture also noted. Carpal bones remain normal alignment.  IMPRESSION: Comminuted fracture of the distal radius, and associated ulnar styloid process fracture.   Electronically Signed   By: Earle Gell M.D.   On: 01/06/2014 11:29   Dg Hip Complete Right  01/06/2014   CLINICAL DATA:  Fall.  Right hip pain.  EXAM: RIGHT HIP - COMPLETE 2+ VIEW  COMPARISON:  04/16/2010 and 10/08/2008  FINDINGS: Left total hip arthroplasty is partially visualized. The right hip is located. There is mild right hip osteoarthrosis. No acute fracture is identified. Surgical material overlies the right iliac wing. Bones appear diffusely osteopenic.  IMPRESSION: No acute osseous abnormality identified.   Electronically Signed   By: Logan Bores   On: 01/06/2014 11:38   Ct Pelvis Wo  Contrast  01/06/2014   CLINICAL DATA:  [place] on ice this morning, injured right hip, severe right hip and pelvic pain.  EXAM: CT PELVIS WITHOUT CONTRAST  TECHNIQUE: Multidetector CT imaging of the pelvis was performed following the standard protocol without intravenous contrast.  COMPARISON:  Right hip x-rays earlier same date.  FINDINGS: Severe osseous demineralization. No fractures identified involving the bony pelvis or either proximal femur. Severe narrowing of the joint space in the right hip. Prior left hip arthroplasty with anatomic alignment of the prosthesis. Sacroiliac joints intact. Symphysis pubis intact with severe degenerative changes. Severe degenerative disc disease and spondylosis and facet degenerative changes at the L5-S1 level.  Soft tissue window images demonstrate no soft tissue hematoma in the vicinity of the right hip. There is no significant right hip joint effusion.  Extensive sigmoid colon diverticulosis. Enlarged uterus with a fibroid involving the left lateral uterine body. Endometrial thickening and/or fluid. Mild aortoiliofemoral atherosclerosis without aneurysm. Small bilateral inguinal hernias containing fat.  IMPRESSION: 1. No acute fractures involving the bony pelvis or either proximal femur. 2. Severe joint space narrowing involving the right hip. 3. Prior left hip arthroplasty with anatomic alignment. 4. Endometrial thickening and/or fluid, more than expected for a patient of this age. Is there dysfunctional uterine bleeding? 5. Fibroid involving the left lateral uterine body. 6. Extensive sigmoid colon diverticulosis without evidence of acute diverticulitis.   Electronically Signed   By: Evangeline Dakin M.D.   On: 01/06/2014 14:24   Dg Knee Complete 4 Views Right  01/06/2014   CLINICAL DATA:  Fall, knee pain  EXAM: RIGHT KNEE - COMPLETE 4+ VIEW  COMPARISON:  None.  FINDINGS: Bones are osteopenic. No displaced fracture. Minor chondrocalcinosis noted. Relatively preserved  joint spaces. On the lateral view, there is a joint effusion with a fluid level suspicious for hemarthrosis.  IMPRESSION: Osteopenia.  Joint effusion/probable hemarthrosis  No acute displaced fracture by plain radiography   Electronically Signed   By: Daryll Brod M.D.   On: 01/06/2014 11:48    They benefited maximally from their hospital stay and there were no complications.     Disposition: 01-Home or Self Care      Future Appointments Provider Department Dept Phone   01/24/2014 2:15 PM Unk Pinto, MD [facility] [phone]   02/22/2014 10:30 AM Vicie Mutters, PA-C [facility] [phone]   07/18/2014 10:30 AM Unk Pinto, MD [facility] [phone]   11/22/2014 10:00 AM Unk Pinto, MD [facility] [phone]     Follow-up Information   Follow up with Linna Hoff, MD. Schedule an appointment as soon as possible for a visit in 2 weeks.   Specialty:  Orthopedic Surgery   Contact information:   [address] [place] [address] [phone]       PT SEEN/EXAMINED THIS AM READY TO GO HOME PT WAS AMBULATING WITH PT  Signed: Linna Hoff 01/12/2014, 9:28 AM

## 2014-01-12 NOTE — Progress Notes (Signed)
Occupational Therapy Treatment and Discharge Patient Details Name: Briana [name] MRN: [id number] DOB: 10-26-1934 Today's Date: 01/12/2014 Time: [id number] OT Time Calculation (min): 38 min  OT Assessment / Plan / Recommendation  History of present illness s/p RUE open reduction internal fixation for distal radius fracture following a fall when pt slipped on ice Saturday morning   OT comments  Pt seen for self care and therapeutic exercise for RUE this date. Completed education and training in compensatory techniques for UB/LB dressing to increase independence when patient returns home alone. Pt was mod I for UB dressing and supervision for LB dressing for safety with balance. Pt demonstrated significant improvement from yesterday's evaluation, likely due to her medications wearing off and increased alertness this date. Daughter was present and participated in treatment session. Educated and provided handout for RUE AROM exercises. Pt would benefit from continued OT services at home to ensure safety with B/IADLs due to pt lives alone. Pt ready for D/C when medically stable.   Follow Up Recommendations  Home health OT       Equipment Recommendations  3 in 1 bedside comode       Frequency   Min 2x  Progress towards OT Goals Progress towards OT goals: Progressing toward goals  Plan Discharge plan remains appropriate    Precautions / Restrictions Precautions Precautions: Fall Restrictions Weight Bearing Restrictions: No   Pertinent Vitals/Pain Pt denies pain in RUE.     ADL  Upper Body Dressing: Modified independent Where Assessed - Upper Body Dressing: Supported sitting Lower Body Dressing: Supervision/safety  Where Assessed - Lower Body Dressing: Supported sit to Lobbyist: Modified independent (with single point cane) Toilet Transfer Method: Sit to Loss adjuster, chartered: Therapist, occupational and Hygiene: Supervision/safety Where  Assessed - Best boy and Hygiene: Sit to stand from 3-in-1 or toilet Equipment Used: Cane Transfers/Ambulation Related to ADLs: pt reports feeling more stable without medications ADL Comments: pt significantly improved from yesterday, likely due to being more alert without medication induced lethargy.       OT Goals(current goals can now be found in the care plan section)    Visit Information  Last OT Received On: 01/12/14 Assistance Needed: +1 History of Present Illness: s/p RUE open reduction internal fixation for distal radius fracture following a fall when pt slipped on ice Saturday morning          Cognition  Cognition Arousal/Alertness: Awake/alert Behavior During Therapy: [facility] for tasks assessed/performed Overall Cognitive Status: Within Functional Limits for tasks assessed    Mobility  Bed Mobility General bed mobility comments: patient sitting in chair when PT arrived Transfers Overall transfer level: Modified independent Equipment used: Straight cane Transfers: Sit to/from Stand Sit to Stand: Modified independent (Device/Increase time)    Exercises  General Exercises - Upper Extremity Shoulder Flexion: AROM;Right;10 reps;Seated Shoulder Horizontal ABduction: AROM;Right;10 reps;Seated Shoulder Horizontal ADduction: AROM;Right;10 reps;Seated Digit Composite Flexion: AROM;Right;10 reps;Seated Composite Extension: AROM;Right;10 reps;Seated Handout provided to pt   Balance Balance Overall balance assessment: No apparent balance deficits (not formally assessed)  End of Session OT - End of Session Equipment Utilized During Treatment: Other (comment) (single point cane) Activity Tolerance: Patient tolerated treatment well Patient left: in chair;with call bell/phone within reach;with family/visitor present (with daughter) Nurse Communication: Other (comment) (pt ready for D/C)  GO Functional Limitation: Self care Self Care Current Status  [id number]): At least 1 percent but less than 20 percent impaired, limited or restricted Self Care Discharge  Status [phone]): At least 1 percent but less than 20 percent impaired, limited or restricted   Juluis Rainier [id number] 01/12/2014, 11:50 AM

## 2014-01-12 NOTE — Evaluation (Signed)
Physical Therapy Evaluation Patient Details Name: SAMOYA HOCHMAN MRN: [id number] DOB: 06/18/1934 Today's Date: 01/12/2014 Time: [id number] PT Time Calculation (min): 20 min  PT Assessment / Plan / Recommendation History of Present Illness  s/p RUE open reduction internal fixation for distal radius fracture following a fall when pt slipped on ice Saturday morning  Clinical Impression  Patient did well with mobility.  Instructed up/down stairs with rail.  Feel patient is safe for d/c home.  Will need intermittent assistance with meals, in/out house, etc.  Will need follow up therapy for right UE when cleared by MD.     PT Assessment  Patent does not need any further PT services    Follow Up Recommendations  Outpatient PT (for right UE as appropriate)    Does the patient have the potential to tolerate intense rehabilitation      Barriers to Discharge        Equipment Recommendations  None recommended by PT    Recommendations for Other Services     Frequency      Precautions / Restrictions Precautions Precautions: Fall Restrictions Weight Bearing Restrictions: No   Pertinent Vitals/Pain Patient reports no pain in arm, some discomfort in right knee.  Placed ice pack on knee after session.      Mobility  Bed Mobility General bed mobility comments: patient sitting in chair when PT arrived Transfers Equipment used: Straight cane Transfers: Sit to/from Stand Sit to Stand: Modified independent (Device/Increase time) Ambulation/Gait Ambulation/Gait assistance: Modified independent (Device/Increase time) Ambulation Distance (Feet): 80 Feet (x 2) Assistive device: Straight cane Gait Pattern/deviations: Step-through pattern Gait velocity interpretation: Below normal speed for age/gender Stairs: Yes Stairs assistance: Supervision Stair Management: One rail Left;Step to pattern Number of Stairs: 3 General stair comments: railing is on left, so patient ascended stairs forward  and we discussed descending backwards and holding onto rail if she is by herself.      Exercises     PT Diagnosis:    PT Problem List:   PT Treatment Interventions:       PT Goals(Current goals can be found in the care plan section) Acute Rehab PT Goals PT Goal Formulation: No goals set, d/c therapy  Visit Information  Last PT Received On: 01/12/14 Assistance Needed: +1 History of Present Illness: s/p RUE open reduction internal fixation for distal radius fracture following a fall when pt slipped on ice Saturday morning       Prior Functioning       Cognition  Cognition Arousal/Alertness: Awake/alert Behavior During Therapy: WFL for tasks assessed/performed Overall Cognitive Status: Within Functional Limits for tasks assessed    Extremity/Trunk Assessment Lower Extremity Assessment Lower Extremity Assessment: Overall WFL for tasks assessed   Balance Balance Overall balance assessment: No apparent balance deficits (not formally assessed)  End of Session PT - End of Session Equipment Utilized During Treatment: Gait belt Activity Tolerance: Patient tolerated treatment well Patient left: in chair;with call bell/phone within reach  GP Functional Assessment Tool Used: clinical judgement Functional Limitation: Mobility: Walking and moving around Mobility: Walking and Moving Around Current Status [id number]): At least 1 percent but less than 20 percent impaired, limited or restricted Mobility: Walking and Moving Around Goal Status [phone]): At least 1 percent but less than 20 percent impaired, limited or restricted Mobility: Walking and Moving Around Discharge Status [phone]): At least 1 percent but less than 20 percent impaired, limited or restricted   Shanna Cisco, [place] 01/12/2014, 9:53 AM

## 2014-01-24 ENCOUNTER — Ambulatory Visit (INDEPENDENT_AMBULATORY_CARE_PROVIDER_SITE_OTHER): Payer: Medicare Other | Admitting: Internal Medicine

## 2014-01-24 ENCOUNTER — Encounter: Payer: Self-pay | Admitting: Internal Medicine

## 2014-01-24 VITALS — BP 130/78 | HR 76 | Temp 99.0°F | Resp 16 | Ht 62.0 in | Wt 151.0 lb

## 2014-01-24 DIAGNOSIS — G250 Essential tremor: Secondary | ICD-10-CM

## 2014-01-24 DIAGNOSIS — G252 Other specified forms of tremor: Secondary | ICD-10-CM

## 2014-01-24 NOTE — Patient Instructions (Signed)
Tremor  Tremor is a rhythmic, involuntary muscular contraction characterized by oscillations (to-and-fro movements) of a part of the body. The most common of all involuntary movements, tremor can affect various body parts such as the hands, head, facial structures, vocal cords, trunk, and legs; most tremors, however, occur in the hands. Tremor often accompanies neurological disorders associated with aging. Although the disorder is not life-threatening, it can be responsible for functional disability and social embarrassment.  TREATMENT   There are many types of tremor and several ways in which tremor is classified. The most common classification is by behavioral context or position. There are five categories of tremor within this classification: resting, postural, kinetic, task-specific, and psychogenic. Resting or static tremor occurs when the muscle is at rest, for example when the hands are lying on the lap. This type of tremor is often seen in patients with Parkinson's disease. Postural tremor occurs when a patient attempts to maintain posture, such as holding the hands outstretched. Postural tremors include physiological tremor, essential tremor, tremor with basal ganglia disease (also seen in patients with Parkinson's disease), cerebellar postural tremor, tremor with peripheral neuropathy, post-traumatic tremor, and alcoholic tremor. Kinetic or intention (action) tremor occurs during purposeful movement, for example during finger-to-nose testing. Task-specific tremor appears when performing goal-oriented tasks such as handwriting, speaking, or standing. This group consists of primary writing tremor, vocal tremor, and orthostatic tremor. Psychogenic tremor occurs in both older and younger patients. The key feature of this tremor is that it dramatically lessens or disappears when the patient is distracted.  PROGNOSIS  There are some treatment options available for tremor; the appropriate treatment depends on  accurate diagnosis of the cause. Some tremors respond to treatment of the underlying condition, for example in some cases of psychogenic tremor treating the patient's underlying mental problem may cause the tremor to disappear. Also, patients with tremor due to Parkinson's disease may be treated with Levodopa drug therapy. Symptomatic drug therapy is available for several other tremors as well. For those cases of tremor in which there is no effective drug treatment, physical measures such as teaching the patient to brace the affected limb during the tremor are sometimes useful. Surgical intervention such as thalamotomy or deep brain stimulation may be useful in certain cases.  Document Released: 10/23/2002 Document Revised: 01/25/2012 Document Reviewed: 11/02/2005  [employer]® Patient Information ©2014 [employer].

## 2014-01-24 NOTE — Progress Notes (Signed)
Subjective:    Patient ID: Briana [name], female    DOB: May 14, 1934, 80 y.o.   MRN: [id number]  HPI Patient is seen in2 month F/U of starting Inderal for tremor predominantly of the rt hand and in a 1 month F/U of adding Diazepam which apparently never was started after she slipped on ice and had surgery of a compound Fx of the Rt wrist about 2 or 3 weeks ago and still has a Rt wrist splint/cast.   Medication List       This list is accurate as of: 01/24/14  8:36 PM.  Always use your most recent med list.               acetaminophen 650 MG CR tablet  Commonly known as:  TYLENOL  Take 650 mg by mouth every 8 (eight) hours as needed for pain.     albuterol 108 (90 BASE) MCG/ACT inhaler  Commonly known as:  PROVENTIL HFA;VENTOLIN HFA  Inhale 2 puffs into the lungs every 6 (six) hours as needed for wheezing or shortness of breath.     aspirin 81 MG tablet  Take 81 mg by mouth daily.     b complex vitamins tablet  Take 1 tablet by mouth daily.     bumetanide 1 MG tablet  Commonly known as:  BUMEX  Take 0.5 mg by mouth 2 (two) times daily.     diazepam 5 MG tablet  Commonly known as:  VALIUM  Take 5 mg by mouth every 6 (six) hours as needed for anxiety.     docusate sodium 100 MG capsule  Commonly known as:  COLACE  Take 1 capsule (100 mg total) by mouth 2 (two) times daily.     MAGNESIUM PO  Take 1 tablet by mouth daily.     methocarbamol 500 MG tablet  Commonly known as:  ROBAXIN  Take 1 tablet (500 mg total) by mouth 4 (four) times daily.     OSTEO BI-FLEX JOINT SHIELD PO  Take by mouth 2 (two) times daily.     PRESCRIPTION MEDICATION  Take 1 tablet by mouth 2 (two) times daily.     PRESCRIPTION MEDICATION  Take 1 tablet by mouth 3 (three) times daily as needed (for tremors).     propranolol ER 80 MG 24 hr capsule  Commonly known as:  INDERAL LA  Take 80 mg by mouth daily.     ranitidine 300 MG tablet  Commonly known as:  ZANTAC  Take 300 mg by mouth 2  (two) times daily.     tamoxifen 20 MG tablet  Commonly known as:  NOLVADEX  Take 20 mg by mouth daily.     traMADol 50 MG tablet  Commonly known as:  ULTRAM  Take by mouth every 6 (six) hours as needed (1 every 6-8 hours as needed).     vitamin C 500 MG tablet  Commonly known as:  ASCORBIC ACID  Take 1 tablet (500 mg total) by mouth daily.     Vitamin D3 2000 UNITS Tabs  Take 2,000-4,000 Units by mouth 2 (two) times daily. Take 4000 units in the morning and 2000 units in the evening       Allergies  Allergen Reactions  . Accupril [Quinapril Hcl] Cough  . Prednisone     Agitated and shaky  . Reglan [Metoclopramide] Other (See Comments)    tremors  . Adhesive [Tape] Rash   Past Medical History  Diagnosis Date  .  Hypertension   . Diverticulitis   . Hyperlipidemia   . Pre-diabetes   . Vitamin D deficiency   . DJD (degenerative joint disease)   . DDD (degenerative disc disease)   . Osteoporosis   . Heart murmur   . Pneumonia   . Diabetes mellitus without complication     "pre-diabetes"  . Varicose veins   . GERD (gastroesophageal reflux disease)     takes ranitidine  . Cancer     skin cancer  . Hx of irritable bowel syndrome   . Occasional tremors    Review of Systems  12 pts neg to above   BP 130/78  Pulse 76  Temp(Src) 99 F (37.2 C) (Temporal)  Resp 16  Ht 5\' 2"  (1.575 m)  Wt 151 lb (68.493 kg)  BMI 27.61 kg/m2  Objective:   Physical Exam HEENT - Eac's patent. TM's Nl.EOM's full. PERRLA. NasoOroPharynx clear. Neck - supple. Nl Thyroid. No bruits nodes JVD Chest - Clear equal BS Cor - Nl HS. RRR w/o sig MGR. PP 1(+) No edema. Abd - No palpable organomegaly, masses or tenderness. BS nl. MS- FROM. w/o deformities. Muscle power tone and bulk Nl. Gait Nl. Neuro - No obvious Cr N abnormalities. Sensory, motor and Cerebellar functions appear Nl. Tremor of Rt hand still present as a high frequency  and low amplitude tremor of the Rt hand somewhat obscured  and stabilized by her cast.   Assessment & Plan:   1. Tremor, essential  Patient and daughter are advise after cast removed to consider if tremor worsens a trial on her Diazepam Rx at 1/2 dose til see how it affects her.

## 2014-02-22 ENCOUNTER — Ambulatory Visit (INDEPENDENT_AMBULATORY_CARE_PROVIDER_SITE_OTHER): Payer: Medicare Other | Admitting: Physician Assistant

## 2014-02-22 ENCOUNTER — Encounter: Payer: Self-pay | Admitting: Physician Assistant

## 2014-02-22 VITALS — BP 140/78 | HR 72 | Temp 98.1°F | Resp 16 | Ht 62.0 in | Wt 151.0 lb

## 2014-02-22 DIAGNOSIS — Z79899 Other long term (current) drug therapy: Secondary | ICD-10-CM

## 2014-02-22 DIAGNOSIS — M810 Age-related osteoporosis without current pathological fracture: Secondary | ICD-10-CM

## 2014-02-22 DIAGNOSIS — Z1331 Encounter for screening for depression: Secondary | ICD-10-CM

## 2014-02-22 DIAGNOSIS — E559 Vitamin D deficiency, unspecified: Secondary | ICD-10-CM

## 2014-02-22 DIAGNOSIS — Z0000 Encounter for general adult medical examination without abnormal findings: Secondary | ICD-10-CM

## 2014-02-22 DIAGNOSIS — I10 Essential (primary) hypertension: Secondary | ICD-10-CM

## 2014-02-22 DIAGNOSIS — E782 Mixed hyperlipidemia: Secondary | ICD-10-CM

## 2014-02-22 DIAGNOSIS — E2839 Other primary ovarian failure: Secondary | ICD-10-CM

## 2014-02-22 DIAGNOSIS — R7309 Other abnormal glucose: Secondary | ICD-10-CM

## 2014-02-22 LAB — CBC WITH DIFFERENTIAL/PLATELET
Basophils Absolute: 0.1 10*3/uL (ref 0.0–0.1)
Basophils Relative: 1 % (ref 0–1)
Eosinophils Absolute: 0.2 10*3/uL (ref 0.0–0.7)
Eosinophils Relative: 3 % (ref 0–5)
HCT: 39.3 % (ref 36.0–46.0)
Hemoglobin: 13.3 g/dL (ref 12.0–15.0)
Lymphocytes Relative: 29 % (ref 12–46)
Lymphs Abs: 1.5 10*3/uL (ref 0.7–4.0)
MCH: 29.6 pg (ref 26.0–34.0)
MCHC: 33.8 g/dL (ref 30.0–36.0)
MCV: 87.3 fL (ref 78.0–100.0)
Monocytes Absolute: 0.6 10*3/uL (ref 0.1–1.0)
Monocytes Relative: 11 % (ref 3–12)
Neutro Abs: 2.9 10*3/uL (ref 1.7–7.7)
Neutrophils Relative %: 56 % (ref 43–77)
Platelets: 207 10*3/uL (ref 150–400)
RBC: 4.5 MIL/uL (ref 3.87–5.11)
RDW: 15 % (ref 11.5–15.5)
WBC: 5.2 10*3/uL (ref 4.0–10.5)

## 2014-02-22 LAB — HEPATIC FUNCTION PANEL
ALT: 12 U/L (ref 0–35)
AST: 19 U/L (ref 0–37)
Albumin: 3.9 g/dL (ref 3.5–5.2)
Alkaline Phosphatase: 62 U/L (ref 39–117)
Bilirubin, Direct: 0.1 mg/dL (ref 0.0–0.3)
Indirect Bilirubin: 0.3 mg/dL (ref 0.2–1.2)
Total Bilirubin: 0.4 mg/dL (ref 0.2–1.2)
Total Protein: 6.1 g/dL (ref 6.0–8.3)

## 2014-02-22 LAB — BASIC METABOLIC PANEL WITH GFR
BUN: 16 mg/dL (ref 6–23)
CO2: 31 mEq/L (ref 19–32)
Calcium: 9.8 mg/dL (ref 8.4–10.5)
Chloride: 103 mEq/L (ref 96–112)
Creat: 0.88 mg/dL (ref 0.50–1.10)
GFR, Est African American: 73 mL/min
GFR, Est Non African American: 63 mL/min
Glucose, Bld: 87 mg/dL (ref 70–99)
Potassium: 4.2 mEq/L (ref 3.5–5.3)
Sodium: 140 mEq/L (ref 135–145)

## 2014-02-22 LAB — LIPID PANEL
Cholesterol: 171 mg/dL (ref 0–200)
HDL: 59 mg/dL (ref >39)
LDL Cholesterol: 96 mg/dL (ref 0–99)
Total CHOL/HDL Ratio: 2.9 Ratio
Triglycerides: 79 mg/dL (ref <150)
VLDL: 16 mg/dL (ref 0–40)

## 2014-02-22 LAB — MAGNESIUM: Magnesium: 2 mg/dL (ref 1.5–2.5)

## 2014-02-22 LAB — HEMOGLOBIN A1C
Hgb A1c MFr Bld: 6.3 % — ABNORMAL HIGH (ref <5.7)
Mean Plasma Glucose: 134 mg/dL — ABNORMAL HIGH (ref <117)

## 2014-02-22 LAB — TSH: TSH: 0.719 u[IU]/mL (ref 0.350–4.500)

## 2014-02-22 NOTE — Patient Instructions (Signed)
Preventative Care for Adults - Female      MAINTAIN REGULAR HEALTH EXAMS:  A routine yearly physical is a good way to check in with your primary care provider about your health and preventive screening. It is also an opportunity to share updates about your health and any concerns you have, and receive a thorough all-over exam.   Most health insurance companies pay for at least some preventative services.  Check with your health plan for specific coverages.  WHAT PREVENTATIVE SERVICES DO WOMEN NEED?  Adult women should have their weight and blood pressure checked regularly.   Women age 35 and older should have their cholesterol levels checked regularly.  Women should be screened for cervical cancer with a Pap smear and pelvic exam beginning at either age 21, or 3 years after they become sexually activity.    Breast cancer screening generally begins at age 40 with a mammogram and breast exam by your primary care provider.    Beginning at age 50 and continuing to age 75, women should be screened for colorectal cancer.  Certain people may need continued testing until age 85.  Updating vaccinations is part of preventative care.  Vaccinations help protect against diseases such as the flu.  Osteoporosis is a disease in which the bones lose minerals and strength as we age. Women ages 65 and over should discuss this with their caregivers, as should women after menopause who have other risk factors.  Lab tests are generally done as part of preventative care to screen for anemia and blood disorders, to screen for problems with the kidneys and liver, to screen for bladder problems, to check blood sugar, and to check your cholesterol level.  Preventative services generally include counseling about diet, exercise, avoiding tobacco, drugs, excessive alcohol consumption, and sexually transmitted infections.    GENERAL RECOMMENDATIONS FOR GOOD HEALTH:  Healthy diet:  Eat a variety of foods, including  fruit, vegetables, animal or vegetable protein, such as meat, fish, chicken, and eggs, or beans, lentils, tofu, and grains, such as rice.  Drink plenty of water daily.  Decrease saturated fat in the diet, avoid lots of red meat, processed foods, sweets, fast foods, and fried foods.  Exercise:  Aerobic exercise helps maintain good heart health. At least 30-40 minutes of moderate-intensity exercise is recommended. For example, a brisk walk that increases your heart rate and breathing. This should be done on most days of the week.   Find a type of exercise or a variety of exercises that you enjoy so that it becomes a part of your daily life.  Examples are running, walking, swimming, water aerobics, and biking.  For motivation and support, explore group exercise such as aerobic class, spin class, Zumba, Yoga,or  martial arts, etc.    Set exercise goals for yourself, such as a certain weight goal, walk or run in a race such as a 5k walk/run.  Speak to your primary care provider about exercise goals.  Disease prevention:  If you smoke or chew tobacco, find out from your caregiver how to quit. It can literally save your life, no matter how long you have been a tobacco user. If you do not use tobacco, never begin.   Maintain a healthy diet and normal weight. Increased weight leads to problems with blood pressure and diabetes.   The Body Mass Index or BMI is a way of measuring how much of your body is fat. Having a BMI above 27 increases the risk of heart disease,   diabetes, hypertension, stroke and other problems related to obesity. Your caregiver can help determine your BMI and based on it develop an exercise and dietary program to help you achieve or maintain this important measurement at a healthful level.  High blood pressure causes heart and blood vessel problems.  Persistent high blood pressure should be treated with medicine if weight loss and exercise do not work.   Fat and cholesterol leaves  deposits in your arteries that can block them. This causes heart disease and vessel disease elsewhere in your body.  If your cholesterol is found to be high, or if you have heart disease or certain other medical conditions, then you may need to have your cholesterol monitored frequently and be treated with medication.   Ask if you should have a cardiac stress test if your history suggests this. A stress test is a test done on a treadmill that looks for heart disease. This test can find disease prior to there being a problem.  Menopause can be associated with physical symptoms and risks. Hormone replacement therapy is available to decrease these. You should talk to your caregiver about whether starting or continuing to take hormones is right for you.   Osteoporosis is a disease in which the bones lose minerals and strength as we age. This can result in serious bone fractures. Risk of osteoporosis can be identified using a bone density scan. Women ages 65 and over should discuss this with their caregivers, as should women after menopause who have other risk factors. Ask your caregiver whether you should be taking a calcium supplement and Vitamin D, to reduce the rate of osteoporosis.   Avoid drinking alcohol in excess (more than two drinks per day).  Avoid use of street drugs. Do not share needles with anyone. Ask for professional help if you need assistance or instructions on stopping the use of alcohol, cigarettes, and/or drugs.  Brush your teeth twice a day with fluoride toothpaste, and floss once a day. Good oral hygiene prevents tooth decay and gum disease. The problems can be painful, unattractive, and can cause other health problems. Visit your dentist for a routine oral and dental check up and preventive care every 6-12 months.   Look at your skin regularly.  Use a mirror to look at your back. Notify your caregivers of changes in moles, especially if there are changes in shapes, colors, a size  larger than a pencil eraser, an irregular border, or development of new moles.  Safety:  Use seatbelts 100% of the time, whether driving or as a passenger.  Use safety devices such as hearing protection if you work in environments with loud noise or significant background noise.  Use safety glasses when doing any work that could send debris in to the eyes.  Use a helmet if you ride a bike or motorcycle.  Use appropriate safety gear for contact sports.  Talk to your caregiver about gun safety.  Use sunscreen with a SPF (or skin protection factor) of 15 or greater.  Lighter skinned people are at a greater risk of skin cancer. Don't forget to also wear sunglasses in order to protect your eyes from too much damaging sunlight. Damaging sunlight can accelerate cataract formation.   Practice safe sex. Use condoms. Condoms are used for birth control and to help reduce the spread of sexually transmitted infections (or STIs).  Some of the STIs are gonorrhea (the clap), chlamydia, syphilis, trichomonas, herpes, HPV (human papilloma virus) and HIV (human immunodeficiency virus)   which causes AIDS. The herpes, HIV and HPV are viral illnesses that have no cure. These can result in disability, cancer and death.   Keep carbon monoxide and smoke detectors in your home functioning at all times. Change the batteries every 6 months or use a model that plugs into the wall.   Vaccinations:  Stay up to date with your tetanus shots and other required immunizations. You should have a booster for tetanus every 10 years. Be sure to get your flu shot every year, since 5%-20% of the [place] population comes down with the flu. The flu vaccine changes each year, so being vaccinated once is not enough. Get your shot in the fall, before the flu season peaks.   Other vaccines to consider:  Human Papilloma Virus or HPV causes cancer of the cervix, and other infections that can be transmitted from person to person. There is a vaccine for  HPV, and females should get immunized between the ages of 64 and 2. It requires a series of 3 shots.   Pneumococcal vaccine to protect against certain types of pneumonia.  This is normally recommended for adults age 73 or older.  However, adults younger than 78 years old with certain underlying conditions such as diabetes, heart or lung disease should also receive the vaccine.  Shingles vaccine to protect against Varicella Zoster if you are older than age 67, or younger than 78 years old with certain underlying illness.  Hepatitis A vaccine to protect against a form of infection of the liver by a virus acquired from food.  Hepatitis B vaccine to protect against a form of infection of the liver by a virus acquired from blood or body fluids, particularly if you work in health care.  If you plan to travel internationally, check with your local health department for specific vaccination recommendations.  Cancer Screening:  Breast cancer screening is essential to preventive care for women. All women age 43 and older should perform a breast self-exam every month. At age 58 and older, women should have their caregiver complete a breast exam each year. Women at ages 67 and older should have a mammogram (x-ray film) of the breasts. Your caregiver can discuss how often you need mammograms.    Cervical cancer screening includes taking a Pap smear (sample of cells examined under a microscope) from the cervix (end of the uterus). It also includes testing for HPV (Human Papilloma Virus, which can cause cervical cancer). Screening and a pelvic exam should begin at age 63, or 3 years after a woman becomes sexually active. Screening should occur every year, with a Pap smear but no HPV testing, up to age 57. After age 36, you should have a Pap smear every 3 years with HPV testing, if no HPV was found previously.   Most routine colon cancer screening begins at the age of 49. On a yearly basis, doctors may provide  special easy to use take-home tests to check for hidden blood in the stool. Sigmoidoscopy or colonoscopy can detect the earliest forms of colon cancer and is life saving. These tests use a small camera at the end of a tube to directly examine the colon. Speak to your caregiver about this at age 56, when routine screening begins (and is repeated every 5 years unless early forms of pre-cancerous polyps or small growths are found).    Advance Directive Advance directives are the legal documents that allow you to make choices about your health care and medical treatment if  you cannot speak for yourself. Advance directives are a way for you to communicate your wishes to family, friends, and health care providers. The specified people can then convey your decisions about end-of-life care to avoid confusion if you should become unable to communicate. Ideally, the process of discussing and writing advance directives should be discussed over time rather than making decisions all at once. Advance directives can be modified as your situation changes and you can change your mind at any time even after you have signed the advance directives. Each state has its own laws regarding advance directives. You may want to check with your health care provider, attorney, or state representative about the law in your state. Below are some examples of advance directives. LIVING WILL A living will is a set of instructions documenting your wishes about medical care when you cannot care for yourself. It is used if you become:  Terminally ill.  Incapacitated.  Unable to communicate.  Unable to make decisions. Items to consider in your living will include:  The use or non-use of life-sustaining equipment, such as dialysis machines and breathing machines (ventilators).  A "do not resuscitate" (DNR) order, which is the instruction not to use CPR if breathing or heartbeat stops.  Tube feeding.  Withholding of food and  fluids.  Comfort (palliative) care when the goal becomes comfort rather than a cure.  Organ and tissue donation. A living does not give instructions about distribution of your money and property if you should pass away. It is advisable to seek the expert advice of a lawyer in drawing up a will regarding your possessions. Decisions about taxes, beneficiaries, and asset distribution will be legally binding. This process can relieve your family and friends of any burdens surrounding disputes or questions that may come up about the allocation of your assets. DO NOT RESUSCITATE (DNR) A do not resuscitate (DNR) order is a request to not have cardiopulmonary resuscitation (CPR) in the event that your heart stops or you stop breathing. Unless given other instructions, a health care provider will try to help any patient whose heart has stopped or who has stopped breathing.  HEALTHCARE PROXY AND DURABLE POWER OF ATTORNEY FOR HEALTH CARE A health care proxy is a person (agent) appointed to make medical decisions for you if you cannot. Generally, people choose someone they know well and trust to represent their preferences when they can no longer do so. You should be sure to ask this person for agreement to act as your agent. An agent may have to exercise judgment in the event of a medical decision for which your wishes are not known. The durable power of attorney for health care is the legal document that names your health care proxy. Once written, it should be:  Signed.  Notarized.  Dated.  Copied.  Witnessed.  Incorporated into your medical record. You may also want to appoint someone to manage your financial affairs if you cannot. This is called a durable power of attorney for finances. It is a separate legal document from the durable power of attorney for health care. You may choose the same person or someone different from your health care proxy to act as your agent in financial matters. Document  Released: 02/09/2008 Document Revised: 07/05/2013 Document Reviewed: 03/22/2013 [facility] Patient Information 2014 [place], [place].

## 2014-02-22 NOTE — Progress Notes (Signed)
Subjective:   Briana [name] is a 80 y.o. female who presents for Medicare Annual Wellness Visit and 3 month follow up on hypertension, prediabetes, hyperlipidemia, vitamin D def.  Date of last medicare wellness visit is unknown.   Her blood pressure has been controlled at home, today their BP is BP: 140/78 mmHg Lab Results  Component Value Date   CREATININE 0.99 01/06/2014   BUN 23 01/06/2014   NA 141 01/06/2014   K 4.2 01/06/2014   CL 100 01/06/2014   CO2 30 01/06/2014   She does workout, she walks her driveway. She denies chest pain, shortness of breath. She has been having some dizziness with standing occasionally.  She is not on cholesterol medication and denies myalgias. Her cholesterol is at goal. The cholesterol last visit was:   Lab Results  Component Value Date   CHOL 181 11/20/2013   HDL 67 11/20/2013   LDLCALC 99 11/20/2013   TRIG 77 11/20/2013   CHOLHDL 2.7 11/20/2013   She has been working on diet and exercise for prediabetes, and denies paresthesia of the feet, polydipsia, polyuria and visual disturbances. Last A1C in the office was:  Lab Results  Component Value Date   HGBA1C 6.3* 11/20/2013   Patient is on Vitamin D supplement. Recent fall in Feb due to ice, broke her right wrist, denies any other falls, no dizziness, SOB, CP prior to fall.   Names of Other Physician/Practitioners you currently use: 1. [facility]- here for primary care 2. Dr. Katy Fitch, eye doctor, last visit 06/2013 3. Dr. Nelva Bush, ortho Patient Care Team: Unk Pinto, MD as PCP - General (Internal Medicine)  Medication Review Current Outpatient Prescriptions on File Prior to Visit  Medication Sig Dispense Refill  . acetaminophen (TYLENOL) 650 MG CR tablet Take 650 mg by mouth every 8 (eight) hours as needed for pain.      Marland Kitchen albuterol (PROVENTIL HFA;VENTOLIN HFA) 108 (90 BASE) MCG/ACT inhaler Inhale 2 puffs into the lungs every 6 (six) hours as needed for wheezing or  shortness of breath.  1 Inhaler  2  . b complex vitamins tablet Take 1 tablet by mouth daily.      . bumetanide (BUMEX) 1 MG tablet Take 0.5 mg by mouth 2 (two) times daily.      . Cholecalciferol (VITAMIN D3) 2000 UNITS TABS Take 2,000-4,000 Units by mouth 2 (two) times daily. Take 4000 units in the morning and 2000 units in the evening      . diazepam (VALIUM) 5 MG tablet Take 5 mg by mouth every 6 (six) hours as needed for anxiety.      Marland Kitchen MAGNESIUM PO Take 1 tablet by mouth daily.      . methocarbamol (ROBAXIN) 500 MG tablet Take 1 tablet (500 mg total) by mouth 4 (four) times daily.  30 tablet  0  . Misc Natural Products (OSTEO BI-FLEX JOINT SHIELD PO) Take by mouth 2 (two) times daily.      Marland Kitchen PRESCRIPTION MEDICATION Take 1 tablet by mouth 2 (two) times daily.      Marland Kitchen PRESCRIPTION MEDICATION Take 1 tablet by mouth 3 (three) times daily as needed (for tremors).      . propranolol ER (INDERAL LA) 80 MG 24 hr capsule Take 80 mg by mouth daily.      . ranitidine (ZANTAC) 300 MG tablet Take 300 mg by mouth 2 (two) times daily.      . tamoxifen (NOLVADEX) 20 MG tablet Take  20 mg by mouth daily.      . traMADol (ULTRAM) 50 MG tablet Take by mouth every 6 (six) hours as needed (1 every 6-8 hours as needed).      . vitamin C (ASCORBIC ACID) 500 MG tablet Take 1 tablet (500 mg total) by mouth daily.  50 tablet  0   No current facility-administered medications on file prior to visit.    Current Problems (verified) Patient Active Problem List   Diagnosis Date Noted  . Fracture of right radius 01/10/2014  . Reflux esophagitis 11/20/2013  . Osteoporosis, unspecified 11/20/2013  . Hypertension 11/19/2013  . Hyperlipidemia 11/19/2013  . PreDiabetes 11/19/2013  . Vitamin D Deficiency 11/19/2013  . Diffuse cystic mastopathy 11/19/2013    Screening Tests Health Maintenance  Topic Date Due  . Tetanus/tdap  11/29/1954  . Colonoscopy  11/29/1985  . Influenza Vaccine  06/16/2014  . Pneumococcal  Polysaccharide Vaccine Age 67 And Over  Completed  . Zostavax  Completed     Immunization History  Administered Date(s) Administered  . DTaP 11/16/2004  . Influenza Whole 08/30/2013  . Pneumococcal Polysaccharide-23 11/16/2001  . Zoster 06/01/2013    Preventative care: Last colonoscopy: 2011 negative Last mammogram: 05/05/13 Last pap smear/pelvic exam: remote   DEXA:2009  Prior vaccinations: TD or Tdap: 2006  Influenza: 2014 Pneumococcal: 2003 Shingles/Zostavax: 2014  History reviewed: allergies, current medications, past family history, past medical history, past social history, past surgical history and problem list  Risk Factors: Osteoporosis: postmenopausal estrogen deficiency and dietary calcium and/or vitamin D deficiency History of fracture in the past year: yes  Tobacco History  Substance Use Topics  . Smoking status: Never Smoker   . Smokeless tobacco: Never Used  . Alcohol Use: No   She does not smoke.  Patient is not a former smoker. Are there smokers in your home (other than you)?  No  Alcohol Current alcohol use: none  Caffeine Current caffeine use: denies use  Exercise Exercise limitations: The patient has no exercise limitations. Current exercise: gardening and walking  Nutrition/Diet Current diet: in general, a "healthy" diet    Cardiac risk factors: advanced age (older than 59 for men, 32 for women), dyslipidemia, hypertension and sedentary lifestyle.  Depression Screen (Note: if answer to either of the following is "Yes", a more complete depression screening is indicated)   Q1: Over the past two weeks, have you felt down, depressed or hopeless? No  Q2: Over the past two weeks, have you felt little interest or pleasure in doing things? No  Have you lost interest or pleasure in daily life? No  Do you often feel hopeless? No  Do you cry easily over simple problems? No  Activities of Daily Living In your present state of health, do you  have any difficulty performing the following activities?:  Driving? No, but she did not drive while her right hand was in a cast Managing money?  No Feeding yourself? No Getting from bed to chair? No Climbing a flight of stairs? No Preparing food and eating?: No Bathing or showering? No Getting dressed: No Getting to the toilet? No Using the toilet:No Moving around from place to place: No In the past year have you fallen or had a near fall?:Yes   Are you sexually active?  No  Do you have more than one partner?  No  Vision Difficulties: No  Hearing Difficulties: No Do you often ask people to speak up or repeat themselves? No Do you experience ringing or  noises in your ears? Yes Do you have difficulty understanding soft or whispered voices? No  Cognition  Do you feel that you have a problem with memory?Yes  Do you often misplace items? No  Do you feel safe at home?  Yes  Advanced directives Does patient have a [place]? No Does patient have a Living Will? No    Objective:     Vision and hearing screens reviewed.   Blood pressure 140/78, pulse 72, temperature 98.1 F (36.7 C), resp. rate 16, height 5\' 2"  (1.575 m), weight 151 lb (68.493 kg). Body mass index is 27.61 kg/(m^2).  General appearance: alert, no distress, WD/WN,  female Cognitive Testing  Alert? Yes  Normal Appearance?Yes  Oriented to person? Yes  Place? Yes   Time? Yes  Recall of three objects?  2/3  Can perform simple calculations? Yes  Displays appropriate judgment?Yes  Can read the correct time from a watch face?Yes  HEENT: normocephalic, sclerae anicteric, TMs pearly, nares patent, no discharge or erythema, pharynx normal Oral cavity: MMM, no lesions Neck: supple, no lymphadenopathy, no thyromegaly, no masses Heart: RRR, normal S1, S2, no murmurs Lungs: CTA bilaterally, no wheezes, rhonchi, or rales Abdomen: +bs, soft, non tender, non distended, no masses, no hepatomegaly,  no splenomegaly Musculoskeletal: nontender, no swelling, no obvious deformity Extremities: no edema, no cyanosis, no clubbing Pulses: 2+ symmetric, upper and lower extremities, normal cap refill Neurological: alert, oriented x 3, CN2-12 intact, strength normal upper extremities and lower extremities, sensation normal throughout, DTRs 2+ throughout, no cerebellar signs, gait normal Psychiatric: normal affect, behavior normal, pleasant  Breast: defer Gyn: defer Rectal: defer   Assessment:   1. Hypertension On Bumex BID but question need for this, patient states she needs it to "pee more" and is resistant to get off it. we will check kidney function and if she is dehydrated we will stop it and put her on losartan.  - CBC with Differential - BASIC METABOLIC PANEL WITH GFR - Hepatic function panel - TSH  2. Osteoporosis, unspecified - DG Bone Density; Future  3. Hyperlipidemia - Lipid panel  4. PreDiabetes Discussed general issues about diabetes pathophysiology and management., Educational material distributed., Suggested low cholesterol diet., Encouraged aerobic exercise., Discussed foot care., Reminded to get yearly retinal exam. - Hemoglobin A1c - HM DIABETES FOOT EXAM  5. Vitamin D Deficiency - Vit D  25 hydroxy (rtn osteoporosis monitoring)  6. Encounter for long-term (current) use of other medications - Magnesium   Plan:   During the course of the visit the patient was educated and counseled about appropriate screening and preventive services including:    Pneumococcal vaccine   Influenza vaccine  Screening electrocardiogram  Screening mammography  Bone densitometry screening  Colorectal cancer screening  Diabetes screening  Glaucoma screening  Nutrition counseling   Advanced directives: given papers  Screening recommendations, referrals:  Vaccinations: Tdap vaccine not indicated Influenza vaccine not indicated Pneumococcal vaccine not  indicated Shingles vaccine not indicated Hep B vaccine not indicated  Nutrition assessed and recommended  Colonoscopy not indicated Mammogram not indicated Pap smear not indicated Pelvic exam not indicated Recommended yearly ophthalmology/optometry visit for glaucoma screening and checkup Recommended yearly dental visit for hygiene and checkup Advanced directives - given information  Conditions/risks identified: BMI: Discussed weight loss, diet, and increase physical activity.  Increase physical activity: AHA recommends 150 minutes of physical activity a week.  Medications reviewed DEXA- ordered Diabetes is at goal, ACE/ARB therapy: No, Reason not on  Ace Inhibitor/ARB therapy:  PREDM, allergy to ACE Urinary Incontinence is not an issue: discussed non pharmacology and pharmacology options.  Fall risk: high- discussed PT, home fall assessment, medications.   Medicare Attestation I have personally reviewed: The patient's medical and social history Their use of alcohol, tobacco or illicit drugs Their current medications and supplements The patient's functional ability including ADLs,fall risks, home safety risks, cognitive, and hearing and visual impairment Diet and physical activities Evidence for depression or mood disorders  The patient's weight, height, BMI, and visual acuity have been recorded in the chart.  I have made referrals, counseling, and provided education to the patient based on review of the above and I have provided the patient with a written personalized care plan for preventive services.     Vicie Mutters, PA-C   02/22/2014

## 2014-02-23 LAB — VITAMIN D 25 HYDROXY (VIT D DEFICIENCY, FRACTURES): Vit D, 25-Hydroxy: 107 ng/mL — ABNORMAL HIGH (ref 30–89)

## 2014-02-27 ENCOUNTER — Other Ambulatory Visit: Payer: Self-pay | Admitting: Internal Medicine

## 2014-02-27 DIAGNOSIS — G250 Essential tremor: Secondary | ICD-10-CM

## 2014-02-27 MED ORDER — PROPRANOLOL HCL ER 80 MG PO CP24: ORAL_CAPSULE | ORAL | Status: DC

## 2014-04-04 ENCOUNTER — Other Ambulatory Visit: Payer: Self-pay | Admitting: Emergency Medicine

## 2014-04-04 MED ORDER — TAMOXIFEN CITRATE 20 MG PO TABS: 20.0000 mg | ORAL_TABLET | Freq: Every day | ORAL | Status: DC

## 2014-04-10 ENCOUNTER — Other Ambulatory Visit: Payer: Self-pay | Admitting: Emergency Medicine

## 2014-04-10 MED ORDER — TAMOXIFEN CITRATE 20 MG PO TABS: 20.0000 mg | ORAL_TABLET | Freq: Every day | ORAL | Status: DC

## 2014-05-28 ENCOUNTER — Encounter: Payer: Self-pay | Admitting: Internal Medicine

## 2014-06-13 ENCOUNTER — Ambulatory Visit (INDEPENDENT_AMBULATORY_CARE_PROVIDER_SITE_OTHER): Payer: Medicare Other | Admitting: Internal Medicine

## 2014-06-13 ENCOUNTER — Encounter: Payer: Self-pay | Admitting: Internal Medicine

## 2014-06-13 VITALS — BP 138/84 | HR 60 | Temp 98.6°F | Resp 16 | Ht 62.5 in | Wt 157.2 lb

## 2014-06-13 DIAGNOSIS — Z1331 Encounter for screening for depression: Secondary | ICD-10-CM

## 2014-06-13 DIAGNOSIS — E782 Mixed hyperlipidemia: Secondary | ICD-10-CM

## 2014-06-13 DIAGNOSIS — Z789 Other specified health status: Secondary | ICD-10-CM

## 2014-06-13 DIAGNOSIS — R7309 Other abnormal glucose: Secondary | ICD-10-CM

## 2014-06-13 DIAGNOSIS — Z1212 Encounter for screening for malignant neoplasm of rectum: Secondary | ICD-10-CM

## 2014-06-13 DIAGNOSIS — E559 Vitamin D deficiency, unspecified: Secondary | ICD-10-CM

## 2014-06-13 DIAGNOSIS — Z0000 Encounter for general adult medical examination without abnormal findings: Secondary | ICD-10-CM

## 2014-06-13 DIAGNOSIS — Z79899 Other long term (current) drug therapy: Secondary | ICD-10-CM

## 2014-06-13 DIAGNOSIS — I10 Essential (primary) hypertension: Secondary | ICD-10-CM

## 2014-06-13 LAB — CBC WITH DIFFERENTIAL/PLATELET
Basophils Absolute: 0 10*3/uL (ref 0.0–0.1)
Basophils Relative: 1 % (ref 0–1)
Eosinophils Absolute: 0.2 10*3/uL (ref 0.0–0.7)
Eosinophils Relative: 4 % (ref 0–5)
HCT: 38.8 % (ref 36.0–46.0)
Hemoglobin: 13.1 g/dL (ref 12.0–15.0)
Lymphocytes Relative: 27 % (ref 12–46)
Lymphs Abs: 1.2 10*3/uL (ref 0.7–4.0)
MCH: 29.5 pg (ref 26.0–34.0)
MCHC: 33.8 g/dL (ref 30.0–36.0)
MCV: 87.4 fL (ref 78.0–100.0)
Monocytes Absolute: 0.5 10*3/uL (ref 0.1–1.0)
Monocytes Relative: 11 % (ref 3–12)
Neutro Abs: 2.5 10*3/uL (ref 1.7–7.7)
Neutrophils Relative %: 57 % (ref 43–77)
Platelets: 186 10*3/uL (ref 150–400)
RBC: 4.44 MIL/uL (ref 3.87–5.11)
RDW: 14.8 % (ref 11.5–15.5)
WBC: 4.4 10*3/uL (ref 4.0–10.5)

## 2014-06-13 MED ORDER — BETAMETHASONE DIPROPIONATE AUG 0.05 % EX CREA: TOPICAL_CREAM | Freq: Two times a day (BID) | CUTANEOUS | Status: DC

## 2014-06-13 NOTE — Patient Instructions (Signed)

## 2014-06-13 NOTE — Progress Notes (Signed)
Patient ID: Briana [name], female   DOB: June 04, 1934, 80 y.o.   MRN: [id number]   Annual Screening Comprehensive Examination  This very nice 80 y.o.WWF presents for complete physical.  Patient has been followed for HTN, Diabetes  Prediabetes, Hyperlipidemia, and Vitamin D Deficiency.    HTN predates since 1996. Patient's BP has been controlled at home. Today's BP: 138/84 mmHg. Patient denies any cardiac symptoms as chest pain, palpitations, shortness of breath, dizziness or ankle swelling.   Patient's hyperlipidemia is controlled with diet and medications. Patient denies myalgias or other medication SE's. Last lipids were 02/22/2014: Cholesterol, Total 171; HDL Cholesterol by NMR 59; LDL (calc) 96; Triglycerides 79   Patient has prediabetes predating since Apr 2011 with A1c 6.6%  and last A1c was 02/22/2014: Hemoglobin-A1c 6.3* She is attempting to manage this with diet. Patient denies reactive hypoglycemic symptoms, visual blurring, diabetic polys, or paresthesias.   Finally, patient has history of Vitamin D Deficiency of 23 in 2008 and last Vitamin D was 02/22/2014: Vit D, 25-Hydroxy 107* . Medication Sig  . acetaminophen  650 MG CR Take 650 mg by mouth every 8 (eight) hours as needed for pain.  Marland Kitchen albuterol  HFA  inhaler Inhale 2 puffs into the lungs every 6  hrs as needed for   . b complex vit Take 1 tablet by mouth daily.  . bumetanide (BUMEX) 1 MG tablet Take 0.5 mg by mouth 2  times daily.  Marland Kitchen VITAMIN D3 2000 UNITS TABS Take 4000 units in the morning and 2000 units in the evening  . MAGNESIUM PO Take 1 tablet by mouth daily.  . methocarbamol (ROBAXIN) 500 MG  Take 1 tablet (500 mg total) by mouth 4 (four) times daily.  Jefm Miles BI-FLEX JOINT SHIELD  Take by mouth 2 (two) times daily.  . propranolol ER  80 MG 24 hr  Take 1 capsule daily for tremors  . ranitidine  300 MG tab Take 300 mg by mouth 2 (two) times daily.  . tamoxifen  20 MG tab Take 1 tablet (20 mg total) by mouth daily.  . traMADol  (ULTRAM) 50 MG tablet Take by mouth every 6 (six) hours as needed   . vitamin C 500 MG tablet Take 1 tablet  by mouth daily.   Allergies  Allergen Reactions  . Accupril [Quinapril Hcl] Cough  . Prednisone     Agitated and shaky  . Reglan [Metoclopramide] Other (See Comments)    tremors  . Adhesive [Tape] Rash   Past Medical History  Diagnosis Date  . Hypertension   . Diverticulitis   . Hyperlipidemia   . Pre-diabetes   . Vitamin D deficiency   . DJD (degenerative joint disease)   . DDD (degenerative disc disease)   . Osteoporosis   . Heart murmur   . Pneumonia   . Diabetes mellitus without complication     "pre-diabetes"  . Varicose veins   . GERD (gastroesophageal reflux disease)     takes ranitidine  . Cancer     skin cancer  . Hx of irritable bowel syndrome   . Occasional tremors    Past Surgical History  Procedure Laterality Date  . Thyroid surgery    . Joint replacement Left     left hip  . Varicose veins stripped    . Tonsillectomy    . Appendectomy    . Tubal ligation    . Breast surgery Right     fluid drained from breast  .  Open reduction internal fixation (orif) distal radial fracture Right 01/10/2014    Procedure: OPEN REDUCTION INTERNAL FIXATION (ORIF) DISTAL RADIAL FRACTURE;  Surgeon: Linna Hoff, MD;  Location: [facility];  Service: Orthopedics;  Laterality: Right;   Family History  Problem Relation Age of Onset  . Hypertension Mother   . Hyperlipidemia Mother   . Heart disease Sister   . Cancer Brother     prostate   History  Substance Use Topics  . Smoking status: Never Smoker   . Smokeless tobacco: Never Used  . Alcohol Use: No    ROS Constitutional: Denies fever, chills, weight loss/gain, headaches, insomnia, fatigue, night sweats, and change in appetite. Eyes: Denies redness, blurred vision, diplopia, discharge, itchy, watery eyes.  ENT: Denies discharge, congestion, post nasal drip, epistaxis, sore throat, earache, hearing loss, dental  pain, Tinnitus, Vertigo, Sinus pain, snoring.  Cardio: Denies chest pain, palpitations, irregular heartbeat, syncope, dyspnea, diaphoresis, orthopnea, PND, claudication, edema Respiratory: denies cough, dyspnea, DOE, pleurisy, hoarseness, laryngitis, wheezing.  Gastrointestinal: Denies dysphagia, heartburn, reflux, water brash, pain, cramps, nausea, vomiting, bloating, diarrhea, constipation, hematemesis, melena, hematochezia, jaundice, hemorrhoids Genitourinary: Denies dysuria, frequency, urgency, nocturia, hesitancy, discharge, hematuria, flank pain Breast: Breast lumps, nipple discharge, bleeding.  Musculoskeletal: Denies arthralgia, myalgia, stiffness, Jt. Swelling, pain, limp, and strain/sprain. Denies falls. Skin: Denies puritis, rash, hives, warts, acne, eczema, changing in skin lesion Neuro: No weakness, tremor, incoordination, spasms, paresthesia, pain Psychiatric: Denies confusion, memory loss, sensory loss. Denies Depression. Endocrine: Denies change in weight, skin, hair change, nocturia, and paresthesia, diabetic polys, visual blurring, hyper / hypo glycemic episodes.  Heme/Lymph: No excessive bleeding, bruising, enlarged lymph nodes.  Physical Exam  BP 138/84  Pulse 60  Temp(Src) 98.6 F (37 C) (Temporal)  Resp 16  Ht 5' 2.5" (1.588 m)  Wt 157 lb 3.2 oz (71.305 kg)  BMI 28.28 kg/m2  General Appearance: Well nourished and in no apparent distress. Eyes: PERRLA, EOMs, conjunctiva no swelling or erythema, normal fundi and vessels. Sinuses: No frontal/maxillary tenderness ENT/Mouth: EACs patent / TMs  nl. Nares clear without erythema, swelling, mucoid exudates. Oral hygiene is good. No erythema, swelling, or exudate. Tongue normal, non-obstructing. Tonsils not swollen or erythematous. Hearing normal.  Neck: Supple, thyroid normal. No bruits, nodes or JVD. Respiratory: Respiratory effort normal.  BS equal and clear bilateral without rales, rhonci, wheezing or stridor. Cardio:  Heart sounds are normal with regular rate and rhythm and no murmurs, rubs or gallops. Peripheral pulses are normal and equal bilaterally without edema. No aortic or femoral bruits. Chest: symmetric with normal excursions and percussion. Breasts: Symmetric, without lumps, nipple discharge, retractions, or fibrocystic changes.  Abdomen: Flat, soft, with bowl sounds. Nontender, no guarding, rebound, hernias, masses, or organomegaly.  Lymphatics: Non tender without lymphadenopathy.  Genitourinary:  Musculoskeletal: Full ROM all peripheral extremities, joint stability, 5/5 strength, and normal gait. Skin: Warm and dry without rashes, lesions, cyanosis, clubbing or  ecchymosis.  Neuro: Cranial nerves intact, reflexes equal bilaterally. Normal muscle tone, no cerebellar symptoms. Sensation intact.  Pysch: Awake and oriented X 3, normal affect, Insight and Judgment appropriate.   Assessment and Plan  1. Annual Screening Examination 2. Hypertension  3. Hyperlipidemia 4. Pre Diabetes 5. Vitamin D Deficiency  Continue prudent diet as discussed, weight control, BP monitoring, regular exercise, and medications. Discussed med's effects and SE's. Screening labs and tests as requested with regular follow-up as recommended.

## 2014-06-14 LAB — HEMOGLOBIN A1C
Hgb A1c MFr Bld: 6.3 % — ABNORMAL HIGH (ref <5.7)
Mean Plasma Glucose: 134 mg/dL — ABNORMAL HIGH (ref <117)

## 2014-06-14 LAB — VITAMIN D 25 HYDROXY (VIT D DEFICIENCY, FRACTURES): Vit D, 25-Hydroxy: 100 ng/mL — ABNORMAL HIGH (ref 30–89)

## 2014-06-14 LAB — HEPATIC FUNCTION PANEL
ALT: 13 U/L (ref 0–35)
AST: 19 U/L (ref 0–37)
Albumin: 4 g/dL (ref 3.5–5.2)
Alkaline Phosphatase: 58 U/L (ref 39–117)
Bilirubin, Direct: 0.1 mg/dL (ref 0.0–0.3)
Indirect Bilirubin: 0.4 mg/dL (ref 0.2–1.2)
Total Bilirubin: 0.5 mg/dL (ref 0.2–1.2)
Total Protein: 6.2 g/dL (ref 6.0–8.3)

## 2014-06-14 LAB — BASIC METABOLIC PANEL WITH GFR
BUN: 18 mg/dL (ref 6–23)
CO2: 25 mEq/L (ref 19–32)
Calcium: 9.5 mg/dL (ref 8.4–10.5)
Chloride: 105 mEq/L (ref 96–112)
Creat: 0.99 mg/dL (ref 0.50–1.10)
GFR, Est African American: 63 mL/min
GFR, Est Non African American: 55 mL/min — ABNORMAL LOW
Glucose, Bld: 120 mg/dL — ABNORMAL HIGH (ref 70–99)
Potassium: 4.4 mEq/L (ref 3.5–5.3)
Sodium: 138 mEq/L (ref 135–145)

## 2014-06-14 LAB — LIPID PANEL
Cholesterol: 165 mg/dL (ref 0–200)
HDL: 65 mg/dL (ref >39)
LDL Cholesterol: 86 mg/dL (ref 0–99)
Total CHOL/HDL Ratio: 2.5 Ratio
Triglycerides: 70 mg/dL (ref <150)
VLDL: 14 mg/dL (ref 0–40)

## 2014-06-14 LAB — URINALYSIS, MICROSCOPIC ONLY
Bacteria, UA: NONE SEEN
Casts: NONE SEEN
Crystals: NONE SEEN
Squamous Epithelial / LPF: NONE SEEN

## 2014-06-14 LAB — MICROALBUMIN / CREATININE URINE RATIO
Creatinine, Urine: 19.6 mg/dL
Microalb Creat Ratio: 25.5 mg/g (ref 0.0–30.0)
Microalb, Ur: 0.5 mg/dL (ref 0.00–1.89)

## 2014-06-14 LAB — INSULIN, FASTING: Insulin fasting, serum: 41 u[IU]/mL — ABNORMAL HIGH (ref 3–28)

## 2014-06-14 LAB — MAGNESIUM: Magnesium: 2 mg/dL (ref 1.5–2.5)

## 2014-06-14 LAB — TSH: TSH: 1.206 u[IU]/mL (ref 0.350–4.500)

## 2014-06-26 ENCOUNTER — Other Ambulatory Visit: Payer: Self-pay | Admitting: Internal Medicine

## 2014-06-26 DIAGNOSIS — Z1231 Encounter for screening mammogram for malignant neoplasm of breast: Secondary | ICD-10-CM

## 2014-06-27 ENCOUNTER — Other Ambulatory Visit: Payer: Self-pay | Admitting: Internal Medicine

## 2014-07-02 ENCOUNTER — Encounter: Payer: Self-pay | Admitting: Physician Assistant

## 2014-07-02 ENCOUNTER — Ambulatory Visit (INDEPENDENT_AMBULATORY_CARE_PROVIDER_SITE_OTHER): Payer: Medicare Other | Admitting: Physician Assistant

## 2014-07-02 VITALS — BP 138/62 | HR 62 | Temp 100.0°F | Resp 16 | Ht 62.5 in | Wt 155.0 lb

## 2014-07-02 DIAGNOSIS — J209 Acute bronchitis, unspecified: Secondary | ICD-10-CM

## 2014-07-02 MED ORDER — AZITHROMYCIN 250 MG PO TABS: ORAL_TABLET | ORAL | Status: AC

## 2014-07-02 NOTE — Patient Instructions (Signed)

## 2014-07-02 NOTE — Progress Notes (Signed)
   Subjective:    Patient ID: Briana [name], female    DOB: 01-05-1934, 80 y.o.   MRN: [id number]  Fever  This is a new problem. Episode onset: 1 week. The problem occurs constantly. The problem has been gradually worsening. The maximum temperature noted was 100 to 100.9 F. The temperature was taken using an oral thermometer. Associated symptoms include congestion, coughing (yellow sputum) and a sore throat. Pertinent negatives include no abdominal pain, chest pain, diarrhea, ear pain, headaches, muscle aches, nausea, rash, sleepiness, urinary pain, vomiting or wheezing. Improvement on treatment: water, mucinex.  Cough Associated symptoms include chills, a fever, postnasal drip and a sore throat. Pertinent negatives include no chest pain, ear pain, headaches, rash, rhinorrhea, shortness of breath or wheezing.    Review of Systems  Constitutional: Positive for fever and chills. Negative for diaphoresis and fatigue.  HENT: Positive for congestion, postnasal drip, sinus pressure, sneezing and sore throat. Negative for ear pain, hearing loss, mouth sores, nosebleeds, rhinorrhea and trouble swallowing.   Respiratory: Positive for cough (yellow sputum). Negative for chest tightness, shortness of breath, wheezing and stridor.   Cardiovascular: Negative for chest pain, palpitations and leg swelling.  Gastrointestinal: Negative for nausea, vomiting, abdominal pain and diarrhea.  Skin: Negative for rash.  Neurological: Negative for headaches.       Objective:   Physical Exam  Constitutional: She is oriented to person, place, and time. She appears well-developed and well-nourished.  HENT:  Head: Normocephalic and atraumatic.  Right Ear: External ear normal.  Left Ear: External ear normal.  Nose: Nose normal.  Mouth/Throat: Posterior oropharyngeal erythema present. No oropharyngeal exudate, posterior oropharyngeal edema or tonsillar abscesses.  Eyes: Conjunctivae are normal. Pupils are equal, round,  and reactive to light.  Neck: Normal range of motion. Neck supple.  Cardiovascular: Normal rate and regular rhythm.   Pulmonary/Chest: Effort normal. No respiratory distress. She has wheezes. She has no rales. She exhibits no tenderness.  Abdominal: Soft. Bowel sounds are normal.  Lymphadenopathy:    She has no cervical adenopathy.  Neurological: She is alert and oriented to person, place, and time.  Skin: Skin is warm and dry.       Assessment & Plan:  1. Acute bronchitis, unspecified organism [466.0] - azithromycin (ZITHROMAX) 250 MG tablet; Take 2 tablets (500 mg) on  Day 1,  followed by 1 tablet (250 mg) once daily on Days 2 through 5.  Dispense: 6 each; Refill: 1

## 2014-07-10 ENCOUNTER — Ambulatory Visit (HOSPITAL_COMMUNITY)
Admission: RE | Admit: 2014-07-10 | Discharge: 2014-07-10 | Disposition: A | Discharge status: Home / Self Care | Payer: Medicare Other | Source: Ambulatory Visit | Attending: Internal Medicine | Admitting: Internal Medicine

## 2014-07-10 DIAGNOSIS — Z1231 Encounter for screening mammogram for malignant neoplasm of breast: Secondary | ICD-10-CM | POA: Diagnosis not present

## 2014-07-16 ENCOUNTER — Encounter (INDEPENDENT_AMBULATORY_CARE_PROVIDER_SITE_OTHER): Payer: Self-pay

## 2014-07-16 ENCOUNTER — Ambulatory Visit (HOSPITAL_COMMUNITY)
Admission: RE | Admit: 2014-07-16 | Discharge: 2014-07-16 | Disposition: A | Discharge status: Home / Self Care | Payer: Medicare Other | Source: Ambulatory Visit | Attending: Physician Assistant | Admitting: Physician Assistant

## 2014-07-16 ENCOUNTER — Other Ambulatory Visit: Payer: Self-pay | Admitting: Physician Assistant

## 2014-07-16 DIAGNOSIS — J209 Acute bronchitis, unspecified: Secondary | ICD-10-CM

## 2014-07-16 DIAGNOSIS — R059 Cough, unspecified: Secondary | ICD-10-CM | POA: Diagnosis present

## 2014-07-16 DIAGNOSIS — R509 Fever, unspecified: Secondary | ICD-10-CM | POA: Insufficient documentation

## 2014-07-16 DIAGNOSIS — R05 Cough: Secondary | ICD-10-CM | POA: Insufficient documentation

## 2014-07-16 MED ORDER — LEVOFLOXACIN 500 MG PO TABS: 500.0000 mg | ORAL_TABLET | Freq: Every day | ORAL | Status: AC

## 2014-07-18 ENCOUNTER — Ambulatory Visit: Payer: Self-pay | Admitting: Internal Medicine

## 2014-07-24 ENCOUNTER — Other Ambulatory Visit: Payer: Self-pay | Admitting: Emergency Medicine

## 2014-07-25 ENCOUNTER — Other Ambulatory Visit: Payer: Self-pay | Admitting: Dermatology

## 2014-07-25 ENCOUNTER — Ambulatory Visit: Payer: Self-pay | Admitting: Internal Medicine

## 2014-09-18 ENCOUNTER — Ambulatory Visit: Payer: Self-pay | Admitting: Physician Assistant

## 2014-10-09 ENCOUNTER — Ambulatory Visit (INDEPENDENT_AMBULATORY_CARE_PROVIDER_SITE_OTHER): Payer: Medicare Other | Admitting: Physician Assistant

## 2014-10-09 ENCOUNTER — Encounter: Payer: Self-pay | Admitting: Physician Assistant

## 2014-10-09 VITALS — BP 138/70 | HR 72 | Temp 98.0°F | Resp 16 | Ht 62.5 in | Wt 162.0 lb

## 2014-10-09 DIAGNOSIS — E559 Vitamin D deficiency, unspecified: Secondary | ICD-10-CM

## 2014-10-09 DIAGNOSIS — Z79899 Other long term (current) drug therapy: Secondary | ICD-10-CM

## 2014-10-09 DIAGNOSIS — R7303 Prediabetes: Secondary | ICD-10-CM

## 2014-10-09 DIAGNOSIS — R7309 Other abnormal glucose: Secondary | ICD-10-CM

## 2014-10-09 DIAGNOSIS — E782 Mixed hyperlipidemia: Secondary | ICD-10-CM

## 2014-10-09 DIAGNOSIS — I10 Essential (primary) hypertension: Secondary | ICD-10-CM

## 2014-10-09 LAB — CBC WITH DIFFERENTIAL/PLATELET
Basophils Absolute: 0.1 10*3/uL (ref 0.0–0.1)
Basophils Relative: 1 % (ref 0–1)
Eosinophils Absolute: 0.2 10*3/uL (ref 0.0–0.7)
Eosinophils Relative: 3 % (ref 0–5)
HCT: 37.8 % (ref 36.0–46.0)
Hemoglobin: 12.8 g/dL (ref 12.0–15.0)
Lymphocytes Relative: 31 % (ref 12–46)
Lymphs Abs: 1.9 10*3/uL (ref 0.7–4.0)
MCH: 30.2 pg (ref 26.0–34.0)
MCHC: 33.9 g/dL (ref 30.0–36.0)
MCV: 89.2 fL (ref 78.0–100.0)
MPV: 10.8 fL (ref 9.4–12.4)
Monocytes Absolute: 0.6 10*3/uL (ref 0.1–1.0)
Monocytes Relative: 9 % (ref 3–12)
Neutro Abs: 3.5 10*3/uL (ref 1.7–7.7)
Neutrophils Relative %: 56 % (ref 43–77)
Platelets: 193 10*3/uL (ref 150–400)
RBC: 4.24 MIL/uL (ref 3.87–5.11)
RDW: 14.7 % (ref 11.5–15.5)
WBC: 6.2 10*3/uL (ref 4.0–10.5)

## 2014-10-09 LAB — BASIC METABOLIC PANEL WITH GFR
BUN: 15 mg/dL (ref 6–23)
CO2: 33 mEq/L — ABNORMAL HIGH (ref 19–32)
Calcium: 9.9 mg/dL (ref 8.4–10.5)
Chloride: 101 mEq/L (ref 96–112)
Creat: 0.91 mg/dL (ref 0.50–1.10)
GFR, Est African American: 70 mL/min
GFR, Est Non African American: 61 mL/min
Glucose, Bld: 97 mg/dL (ref 70–99)
Potassium: 4 mEq/L (ref 3.5–5.3)
Sodium: 141 mEq/L (ref 135–145)

## 2014-10-09 LAB — HEPATIC FUNCTION PANEL
ALT: 11 U/L (ref 0–35)
AST: 19 U/L (ref 0–37)
Albumin: 4 g/dL (ref 3.5–5.2)
Alkaline Phosphatase: 46 U/L (ref 39–117)
Bilirubin, Direct: 0.1 mg/dL (ref 0.0–0.3)
Indirect Bilirubin: 0.4 mg/dL (ref 0.2–1.2)
Total Bilirubin: 0.5 mg/dL (ref 0.2–1.2)
Total Protein: 5.9 g/dL — ABNORMAL LOW (ref 6.0–8.3)

## 2014-10-09 LAB — LIPID PANEL
Cholesterol: 157 mg/dL (ref 0–200)
HDL: 58 mg/dL
LDL Cholesterol: 79 mg/dL (ref 0–99)
Total CHOL/HDL Ratio: 2.7 ratio
Triglycerides: 101 mg/dL
VLDL: 20 mg/dL (ref 0–40)

## 2014-10-09 LAB — TSH: TSH: 0.65 u[IU]/mL (ref 0.350–4.500)

## 2014-10-09 LAB — MAGNESIUM: Magnesium: 1.9 mg/dL (ref 1.5–2.5)

## 2014-10-09 MED ORDER — MELOXICAM 7.5 MG PO TABS
ORAL_TABLET | ORAL | Status: DC
Start: 2014-10-09 — End: 2015-05-30

## 2014-10-09 MED ORDER — TAMOXIFEN CITRATE 20 MG PO TABS: 20.0000 mg | ORAL_TABLET | Freq: Every day | ORAL | Status: DC

## 2014-10-09 NOTE — Patient Instructions (Signed)
Please try to avoid milk and dairy products.  If this does not help decrease gas you can try Benefiber.  Benefiber is good for constipation/diarrhea/irritable bowel syndrome, it helps with weight loss and can help lower your bad cholesterol. Please do 1-2 TBSP in the morning in water, coffee, or tea. It can take up to a month before you can see a difference with your bowel movements. It is cheapest from [employer], [employer], [employer].   For you left knee this is likely arthritis, i'm going to send in Mobic 7.5mg  that you can take up to twice a day. You CAN take tyelnol with it but NOT aleve/ibuprofen. If you knee continues to hurt suggest seeing ortho.      Bad carbs also include fruit juice, alcohol, and sweet tea. These are empty calories that do not signal to your brain that you are full.   Please remember the good carbs are still carbs which convert into sugar. So please measure them out no more than 1/2-1 cup of rice, oatmeal, pasta, and beans  Veggies are however free foods! Pile them on.   Not all fruit is created equal. Please see the list below, the fruit at the bottom is higher in sugars than the fruit at the top. Please avoid all dried fruits.

## 2014-10-09 NOTE — Progress Notes (Signed)
Assessment and Plan:  Hypertension: Continue medication, monitor blood pressure at home. Continue DASH diet.  Reminder to go to the ER if any CP, SOB, nausea, dizziness, severe HA, changes vision/speech, left arm numbness and tingling, and jaw pain. Cholesterol: Continue diet and exercise. Check cholesterol.  Pre-diabetes-Continue diet and exercise. Check A1C Vitamin D Def- check level and continue medications.  Knee pain/OA- mobic 7.5mg  PRN, discussed kidney function, will monitor, if not better will refer to ortho for injection.  flatulence avoid dairy, try benefiber- if worse call the office  Continue diet and meds as discussed. Further disposition pending results of labs.  HPI 80 y.o. female  presents for 3 month follow up with hypertension, hyperlipidemia, prediabetes and vitamin D. Her blood pressure has been controlled at home, today their BP is BP: 138/70 mmHg She does workout, she walks her driveway . She denies chest pain, shortness of breath, dizziness.  She is not on cholesterol medication and denies myalgias. Her cholesterol is at goal. The cholesterol last visit was:   Lab Results  Component Value Date   CHOL 165 06/13/2014   HDL 65 06/13/2014   LDLCALC 86 06/13/2014   TRIG 70 06/13/2014   CHOLHDL 2.5 06/13/2014   She has been working on diet and exercise for prediabetes, and denies polydipsia, polyuria and visual disturbances. Last A1C in the office was:  Lab Results  Component Value Date   HGBA1C 6.3* 06/13/2014   Patient is on Vitamin D supplement.   Lab Results  Component Value Date   VD25OH 100* 06/13/2014     She has left knee pain, worse after sitting for a while, no popping/clicking/giving way. Aleve helps.  Has some increased flatulance, denies diarrhea, etc.   Current Medications:  Current Outpatient Prescriptions on File Prior to Visit  Medication Sig Dispense Refill  . acetaminophen (TYLENOL) 650 MG CR tablet Take 650 mg by mouth every 8 (eight) hours  as needed for pain.    Marland Kitchen albuterol (PROVENTIL HFA;VENTOLIN HFA) 108 (90 BASE) MCG/ACT inhaler Inhale 2 puffs into the lungs every 6 (six) hours as needed for wheezing or shortness of breath. 1 Inhaler 2  . augmented betamethasone dipropionate (DIPROLENE AF) 0.05 % cream Apply topically 2 (two) times daily. 30 g 3  . b complex vitamins tablet Take 1 tablet by mouth daily.    . bumetanide (BUMEX) 1 MG tablet Take 0.5 mg by mouth 2 (two) times daily.    . bumetanide (BUMEX) 1 MG tablet TAKE ONE-HALF TO ONE TABLET BY MOUTH TWICE DAILY AS NEEDED FOR  SWELLING 90 tablet 0  . Cholecalciferol (VITAMIN D3) 2000 UNITS TABS Take 2,000-4,000 Units by mouth 2 (two) times daily. Take 4000 units in the morning and 2000 units in the evening    . MAGNESIUM PO Take 1 tablet by mouth daily.    . methocarbamol (ROBAXIN) 500 MG tablet Take 1 tablet (500 mg total) by mouth 4 (four) times daily. 30 tablet 0  . Misc Natural Products (OSTEO BI-FLEX JOINT SHIELD PO) Take by mouth 2 (two) times daily.    . propranolol ER (INDERAL LA) 80 MG 24 hr capsule Take 1 capsule daily for tremors 90 capsule 99  . ranitidine (ZANTAC) 300 MG tablet Take 300 mg by mouth 2 (two) times daily.    . ranitidine (ZANTAC) 300 MG tablet TAKE ONE TABLET BY MOUTH TWICE DAILY FOR  ACID  REFLUX 180 tablet 1  . tamoxifen (NOLVADEX) 20 MG tablet Take 1 tablet (20 mg  total) by mouth daily. 90 tablet 1  . traMADol (ULTRAM) 50 MG tablet Take by mouth every 6 (six) hours as needed (1 every 6-8 hours as needed).    . vitamin C (ASCORBIC ACID) 500 MG tablet Take 1 tablet (500 mg total) by mouth daily. 50 tablet 0   No current facility-administered medications on file prior to visit.   Medical History:  Past Medical History  Diagnosis Date  . Hypertension   . Diverticulitis   . Hyperlipidemia   . Pre-diabetes   . Vitamin D deficiency   . DJD (degenerative joint disease)   . DDD (degenerative disc disease)   . Osteoporosis   . Heart murmur   .  Pneumonia   . Diabetes mellitus without complication     "pre-diabetes"  . Varicose veins   . GERD (gastroesophageal reflux disease)     takes ranitidine  . Cancer     skin cancer  . Hx of irritable bowel syndrome   . Occasional tremors    Allergies:  Allergies  Allergen Reactions  . Accupril [Quinapril Hcl] Cough  . Prednisone     Agitated and shaky  . Reglan [Metoclopramide] Other (See Comments)    tremors  . Adhesive [Tape] Rash     Review of Systems: [X]  = complains of  [ ]  = denies  General: Fatigue [ ]  Fever [ ]  Chills [ ]  Weakness [ ]   Insomnia [ ]  Eyes: Redness [ ]  Blurred vision [ ]  Diplopia [ ]   ENT: Congestion [ ]  Sinus Pain [ ]  Post Nasal Drip [ ]  Sore Throat [ ]  Earache [ ]   Cardiac: Chest pain/pressure [ ]  SOB [ ]  Orthopnea [ ]   Palpitations [ ]   Paroxysmal nocturnal dyspnea[ ]  Claudication [ ]  Edema [ ]   Pulmonary: Cough [ ]  Wheezing[ ]   SOB [ ]   Snoring [ ]   GI: Nausea [ ]  Vomiting[ ]  Dysphagia[ ]  Heartburn[ ]  Abdominal pain [ ]  Constipation [ ] ; Diarrhea [ ] ; BRBPR [ ]  Melena[ ]  GU: Hematuria[ ]  Dysuria [ ]  Nocturia[ ]  Urgency [ ]   Hesitancy [ ]  Discharge [ ]  Neuro: Headaches[ ]  Vertigo[ ]  Paresthesias[ ]  Spasm [ ]  Speech changes [ ]  Incoordination [ ]   Ortho: Arthritis [ ]  Joint pain [x ] Muscle pain [ ]  Joint swelling [ ]  Back Pain [ ]  Skin:  Rash [ ]   Pruritis [ ]  Change in skin lesion [ ]   Psych: Depression[ ]  Anxiety[ ]  Confusion [ ]  Memory loss [ ]   Heme/Lypmh: Bleeding [ ]  Bruising [ ]  Enlarged lymph nodes [ ]   Endocrine: Visual blurring [ ]  Paresthesia [ ]  Polyuria [ ]  Polydypsea [ ]    Heat/cold intolerance [ ]  Hypoglycemia [ ]   Family history- Review and unchanged Social history- Review and unchanged Physical Exam: BP 138/70 mmHg  Pulse 72  Temp(Src) 98 F (36.7 C)  Resp 16  Ht 5' 2.5" (1.588 m)  Wt 162 lb (73.483 kg)  BMI 29.14 kg/m2 Wt Readings from Last 3 Encounters:  10/09/14 162 lb (73.483 kg)  07/02/14 155 lb (70.308 kg)  06/13/14  157 lb 3.2 oz (71.305 kg)   General Appearance: Well nourished, in no apparent distress. Eyes: PERRLA, EOMs, conjunctiva no swelling or erythema Sinuses: No Frontal/maxillary tenderness ENT/Mouth: Ext aud canals clear, TMs without erythema, bulging. No erythema, swelling, or exudate on post pharynx.  Tonsils not swollen or erythematous. Hearing normal.  Neck: Supple, thyroid normal.  Respiratory: Respiratory effort normal, BS equal bilaterally  without rales, rhonchi, wheezing or stridor.  Cardio: RRR with no MRGs. Brisk peripheral pulses without edema.  Abdomen: Soft, + BS.  Non tender, no guarding, rebound, hernias, masses. Lymphatics: Non tender without lymphadenopathy.  Musculoskeletal: Full ROM, 5/5 strength,Patient is able to ambulate well.   Gait is  antalgic. left knee with crepitus and lateral joint line tenderness no effusion, no erythema, ACL stable and no patellar laxity Skin: Warm, dry without rashes, lesions, ecchymosis.  Neuro: Cranial nerves intact. Normal muscle tone, no cerebellar symptoms. Sensation intact.  Psych: Awake and oriented X 3, normal affect, Insight and Judgment appropriate.    Vicie Mutters, PA-C 1:48 PM [facility]

## 2014-10-10 LAB — HEMOGLOBIN A1C
Hgb A1c MFr Bld: 6 % — ABNORMAL HIGH (ref <5.7)
Mean Plasma Glucose: 126 mg/dL — ABNORMAL HIGH (ref <117)

## 2014-11-03 ENCOUNTER — Other Ambulatory Visit: Payer: Self-pay | Admitting: Internal Medicine

## 2014-11-22 ENCOUNTER — Encounter: Payer: Self-pay | Admitting: Internal Medicine

## 2014-12-25 ENCOUNTER — Ambulatory Visit: Payer: Self-pay | Admitting: Internal Medicine

## 2014-12-28 DIAGNOSIS — M25562 Pain in left knee: Secondary | ICD-10-CM | POA: Diagnosis not present

## 2014-12-28 DIAGNOSIS — Z96642 Presence of left artificial hip joint: Secondary | ICD-10-CM | POA: Diagnosis not present

## 2014-12-28 DIAGNOSIS — Z471 Aftercare following joint replacement surgery: Secondary | ICD-10-CM | POA: Diagnosis not present

## 2015-01-01 ENCOUNTER — Other Ambulatory Visit: Payer: Self-pay | Admitting: Internal Medicine

## 2015-01-15 ENCOUNTER — Ambulatory Visit: Payer: Self-pay | Admitting: Internal Medicine

## 2015-01-21 ENCOUNTER — Ambulatory Visit (INDEPENDENT_AMBULATORY_CARE_PROVIDER_SITE_OTHER): Payer: Medicare Other | Admitting: Internal Medicine

## 2015-01-21 ENCOUNTER — Encounter: Payer: Self-pay | Admitting: Internal Medicine

## 2015-01-21 VITALS — BP 136/78 | HR 72 | Temp 97.9°F | Resp 16 | Ht 62.0 in | Wt 163.7 lb

## 2015-01-21 DIAGNOSIS — R7309 Other abnormal glucose: Secondary | ICD-10-CM | POA: Diagnosis not present

## 2015-01-21 DIAGNOSIS — E782 Mixed hyperlipidemia: Secondary | ICD-10-CM | POA: Diagnosis not present

## 2015-01-21 DIAGNOSIS — Z9181 History of falling: Secondary | ICD-10-CM

## 2015-01-21 DIAGNOSIS — Z79899 Other long term (current) drug therapy: Secondary | ICD-10-CM

## 2015-01-21 DIAGNOSIS — I10 Essential (primary) hypertension: Secondary | ICD-10-CM | POA: Diagnosis not present

## 2015-01-21 DIAGNOSIS — Z0000 Encounter for general adult medical examination without abnormal findings: Secondary | ICD-10-CM

## 2015-01-21 DIAGNOSIS — R7303 Prediabetes: Secondary | ICD-10-CM

## 2015-01-21 DIAGNOSIS — E559 Vitamin D deficiency, unspecified: Secondary | ICD-10-CM | POA: Diagnosis not present

## 2015-01-21 DIAGNOSIS — Z1331 Encounter for screening for depression: Secondary | ICD-10-CM

## 2015-01-21 LAB — CBC WITH DIFFERENTIAL/PLATELET
Basophils Absolute: 0 10*3/uL (ref 0.0–0.1)
Basophils Relative: 0 % (ref 0–1)
Eosinophils Absolute: 0.2 10*3/uL (ref 0.0–0.7)
Eosinophils Relative: 3 % (ref 0–5)
HCT: 40.8 % (ref 36.0–46.0)
Hemoglobin: 13.3 g/dL (ref 12.0–15.0)
Lymphocytes Relative: 27 % (ref 12–46)
Lymphs Abs: 1.8 10*3/uL (ref 0.7–4.0)
MCH: 29.8 pg (ref 26.0–34.0)
MCHC: 32.6 g/dL (ref 30.0–36.0)
MCV: 91.5 fL (ref 78.0–100.0)
MPV: 11.1 fL (ref 8.6–12.4)
Monocytes Absolute: 0.7 10*3/uL (ref 0.1–1.0)
Monocytes Relative: 11 % (ref 3–12)
Neutro Abs: 4 10*3/uL (ref 1.7–7.7)
Neutrophils Relative %: 59 % (ref 43–77)
Platelets: 199 10*3/uL (ref 150–400)
RBC: 4.46 MIL/uL (ref 3.87–5.11)
RDW: 14.6 % (ref 11.5–15.5)
WBC: 6.7 10*3/uL (ref 4.0–10.5)

## 2015-01-21 NOTE — Patient Instructions (Signed)

## 2015-01-21 NOTE — Progress Notes (Signed)
Patient ID: Briana [name], female   DOB: November 28, 1933, 81 y.o.   MRN: [id number]  MEDICARE ANNUAL WELLNESS VISIT AND OV  Assessment:  1. Essential hypertension  - TSH  2. Hyperlipidemia  - Lipid panel  3. Prediabetes  - Hemoglobin A1c - Insulin, fasting  4. Vitamin D deficiency  - Vit D  25 hydroxy (rtn osteoporosis monitoring)  5. Depression screen   6. At low risk for fall   7. Medication management  - CBC with Differential/Platelet - BASIC METABOLIC PANEL WITH GFR - Hepatic function panel - Magnesium  8. Routine general medical examination at a health care facility   Plan:   During the course of the visit the patient was educated and counseled about appropriate screening and preventive services including:    Pneumococcal vaccine   Influenza vaccine  Td vaccine  Screening electrocardiogram  Bone densitometry screening  Colorectal cancer screening  Diabetes screening  Glaucoma screening  Nutrition counseling   Advanced directives: requested  Screening recommendations, referrals: Vaccinations:  Immunization History  Administered Date(s) Administered  . DTaP 11/16/2004  . Influenza Whole 08/30/2013  . Pneumococcal Polysaccharide-23 11/16/2001  . Zoster 06/01/2013  Prevnar vaccine undecided Hep B vaccine not indicated  Nutrition assessed and recommended  Colonoscopy 01/21/2010 Recommended yearly ophthalmology/optometry visit for glaucoma screening and checkup Recommended yearly dental visit for hygiene and checkup Advanced directives - No - offered forms  Conditions/risks identified: BMI: Discussed weight loss, diet, and increase physical activity.  Increase physical activity: AHA recommends 150 minutes of physical activity a week.  Medications reviewed PreDiabetes is not at goal, ACE/ARB therapy: Not Indicated Urinary Incontinence is not an issue: discussed non pharmacology and pharmacology options.  Fall risk: low- discussed PT, home  fall assessment, medications.   Subjective:    Briana [name]  presents for [employer] Visit and OV.  Date of last medicare wellness visit was 03/14/2014. This very nice 81 y.o. WWF presents for 3 month follow up with Hypertension, Hyperlipidemia, Pre-Diabetes and Vitamin D Deficiency.    Patient is treated for HTN since 1996 & BP has been controlled at home. Today's BP was 136/78 mmHg. Patient has had no complaints of any cardiac type chest pain, palpitations, dyspnea/orthopnea/PND, dizziness, claudication, or dependent edema.   Hyperlipidemia is controlled with diet & meds. Patient denies myalgias or other med SE's. Last Lipids were at goal - Total Chol157; HDL 58; LDL  79; Trig 101 on 10/09/2014.   Also, the patient has history of PreDiabetes April 2011 with A1c 6.6% and 6.2% in 2014 and has had no symptoms of reactive hypoglycemia, diabetic polys, paresthesias or visual blurring.  Last A1c was  6.0% on  10/09/2014.   Further, the patient also has history of Vitamin D Deficiency and supplements vitamin D without any suspected side-effects. Last vitamin D was  100* 06/13/2014.  Names of Other Physician/Practitioners you currently use: 1. [facility] here for primary care 2. Dr Katy Fitch, eye doctor, last visit Dec 2015 3. Dr Eliezer Bottom, [place], dentist, last visit Oct 2015  Patient Care Team: Unk Pinto, MD as PCP - General (Internal Medicine) Iran Planas, MD as Consulting Physician (Orthopedic Surgery) Ladene Artist, MD as Consulting Physician (Gastroenterology) Suella Broad, MD as Consulting Physician (Physical Medicine and Rehabilitation) Rolm Bookbinder, MD as Consulting Physician (Dermatology)  Medication Review: Medication Sig  . acetaminophen (TYLENOL) 650 MG CR tablet Take 650 mg by mouth every 8 (eight) hours as needed for pain.  Marland Kitchen  albuterol (PROVENTIL HFA;VENTOLIN HFA) 108 (90 BASE) MCG/ACT inhaler Inhale 2 puffs into the lungs  every 6 (six) hours as needed for wheezing or shortness of breath.  Marland Kitchen augmented betamethasone dipropionate (DIPROLENE AF) 0.05 % cream Apply topically 2 (two) times daily.  Marland Kitchen b complex vitamins tablet Take 1 tablet by mouth daily.  . bumetanide (BUMEX) 1 MG tablet TAKE ONE-HALF TO ONE TABLET BY MOUTH TWICE DAILY AS NEEDED FOR SWELLING  . Cholecalciferol (VITAMIN D3) 2000 UNITS TABS Take 2,000-4,000 Units by mouth 2 (two) times daily. Take 4000 units in the morning and 2000 units in the evening  . MAGNESIUM PO Take 1 tablet by mouth daily.  . meloxicam (MOBIC) 7.5 MG tablet 1 pill twice daily as needed for joint pain with food  . methocarbamol (ROBAXIN) 500 MG tablet Take 1 tablet (500 mg total) by mouth 4 (four) times daily.  . Misc Natural Products (OSTEO BI-FLEX JOINT SHIELD PO) Take by mouth 2 (two) times daily.  . propranolol ER (INDERAL LA) 80 MG 24 hr  Take 1 capsule daily for tremors  . ranitidine (ZANTAC) 300 MG tablet Take 300 mg by mouth 2 (two) times daily.  . ranitidine (ZANTAC) 300 MG tablet TAKE ONE TABLET BY MOUTH TWICE DAILY FOR ACID REFLUX  . tamoxifen (NOLVADEX) 20 MG tablet Take 1 tablet (20 mg total) by mouth daily.  . vitamin C (ASCORBIC ACID) 500 MG tablet Take 1 tablet (500 mg total) by mouth daily.   Current Problems (verified) Patient Active Problem List   Diagnosis Date Noted  . Encounter for long-term (current) use of other medications 06/13/2014  . Fracture of right radius 01/10/2014  . GERD 11/20/2013  . Osteoporosis, unspecified 11/20/2013  . Essential hypertension 11/19/2013  . Hyperlipidemia 11/19/2013  . Prediabetes 11/19/2013  . Vitamin D deficiency 11/19/2013  . Diffuse cystic mastopathy 11/19/2013   Screening Tests Health Maintenance  Topic Date Due  . TETANUS/TDAP  11/29/1954  . COLONOSCOPY  11/29/1985  . PNA vac Low Risk Adult (2 of 2 - PCV13) 11/16/2002  . INFLUENZA VACCINE  06/16/2014  . DEXA SCAN  Completed  . ZOSTAVAX  Completed    Immunization History  Administered Date(s) Administered  . DTaP 11/16/2004  . Influenza Whole 08/30/2013  . Pneumococcal Polysaccharide-23 11/16/2001  . Zoster 06/01/2013   Preventative care: Last colonoscopy: 01/21/2010  History reviewed: allergies, current medications, past family history, past medical history, past social history, past surgical history and problem list  Risk Factors: Tobacco History  Substance Use Topics  . Smoking status: Never Smoker   . Smokeless tobacco: Never Used  . Alcohol Use: No   She does not smoke.  Patient is not a former smoker. Are there smokers in your home (other than you)?  No Alcohol Current alcohol use: none  Caffeine Current caffeine use: infrequent   Exercise Current exercise: water aerobics  Nutrition/Diet Current diet: in general, a "healthy" diet    Cardiac risk factors: advanced age (older than 44 for men, 47 for women), dyslipidemia, hypertension, obesity (BMI >= 30 kg/m2) and sedentary lifestyle.  Depression Screen (Note: if answer to either of the following is "Yes", a more complete depression screening is indicated)   Q1: Over the past two weeks, have you felt down, depressed or hopeless? No  Q2: Over the past two weeks, have you felt little interest or pleasure in doing things? No  Have you lost interest or pleasure in daily life? No  Do you often feel hopeless? No  Do you cry easily over simple problems? No  Activities of Daily Living In your present state of health, do you have any difficulty performing the following activities?:  Driving? No Managing money?  No Feeding yourself? No Getting from bed to chair? No Climbing a flight of stairs? No Preparing food and eating?: No Bathing or showering? No Getting dressed: No Getting to the toilet? No Using the toilet:No Moving around from place to place: No In the past year have you fallen or had a near fall?:No   Are you sexually active?  No  Do you have more  than one partner?  No  Vision Difficulties: No  Hearing Difficulties: No Do you often ask people to speak up or repeat themselves? No Do you experience ringing or noises in your ears? No Do you have difficulty understanding soft or whispered voices? Sometimes.  Cognition  Do you feel that you have a problem with memory?No  Do you often misplace items? No  Do you feel safe at home?  Yes  Advanced directives Does patient have a [place]? No - offered forms Does patient have a Living Will? No - offered forms  Past Medical History  Diagnosis Date  . Hypertension   . Diverticulitis   . Hyperlipidemia   . Pre-diabetes   . Vitamin D deficiency   . DJD (degenerative joint disease)   . DDD (degenerative disc disease)   . Osteoporosis   . Heart murmur   . Pneumonia   . Diabetes mellitus without complication     "pre-diabetes"  . Varicose veins   . GERD (gastroesophageal reflux disease)     takes ranitidine  . Cancer     skin cancer  . Hx of irritable bowel syndrome   . Occasional tremors    Past Surgical History  Procedure Laterality Date  . Thyroid surgery    . Joint replacement Left     left hip  . Varicose veins stripped    . Tonsillectomy    . Appendectomy    . Tubal ligation    . Breast surgery Right     fluid drained from breast  . Open reduction internal fixation (orif) distal radial fracture Right 01/10/2014    Procedure: OPEN REDUCTION INTERNAL FIXATION (ORIF) DISTAL RADIAL FRACTURE;  Surgeon: Linna Hoff, MD;  Location: [facility];  Service: Orthopedics;  Laterality: Right;    ROS: Constitutional: Denies fever, chills, weight loss/gain, headaches, insomnia, fatigue, night sweats, and change in appetite. Eyes: Denies redness, blurred vision, diplopia, discharge, itchy, watery eyes.  ENT: Denies discharge, congestion, post nasal drip, epistaxis, sore throat, earache, hearing loss, dental pain, Tinnitus, Vertigo, Sinus pain, snoring.  Cardio:  Denies chest pain, palpitations, irregular heartbeat, syncope, dyspnea, diaphoresis, orthopnea, PND, claudication, edema Respiratory: denies cough, dyspnea, DOE, pleurisy, hoarseness, laryngitis, wheezing.  Gastrointestinal: Denies dysphagia, heartburn, reflux, water brash, pain, cramps, nausea, vomiting, bloating, diarrhea, constipation, hematemesis, melena, hematochezia, jaundice, hemorrhoids Genitourinary: Denies dysuria, frequency, urgency, nocturia, hesitancy, discharge, hematuria, flank pain Breast: Breast lumps, nipple discharge, bleeding.  Musculoskeletal: Denies arthralgia, myalgia, stiffness, Jt. Swelling, pain, limp, and strain/sprain. Denies falls. Skin: Denies puritis, rash, hives, warts, acne, eczema, changing in skin lesion Neuro: No weakness, tremor, incoordination, spasms, paresthesia, pain Psychiatric: Denies confusion, memory loss, sensory loss. Denies Depression. Endocrine: Denies change in weight, skin, hair change, nocturia, and paresthesia, diabetic polys, visual blurring, hyper / hypo glycemic episodes.  Heme/Lymph: No excessive bleeding, bruising, enlarged lymph nodes  Objective:  BP 136/78   Pulse 72  Temp 97.9 F Resp 16  Ht 5\' 2"    Wt 163 lb 11.2 oz     BMI 29.93  General Appearance: Well nourished, alert, WD/WN, femaleand in no apparent distress. Eyes: PERRLA, EOMs, conjunctiva no swelling or erythema, normal fundi and vessels. Sinuses: No frontal/maxillary tenderness ENT/Mouth: EACs patent / TMs  nl. Nares clear without erythema, swelling, mucoid exudates. Oral hygiene is good. No erythema, swelling, or exudate. Tongue normal, non-obstructing. Tonsils not swollen or erythematous. Hearing normal.  Neck: Supple, thyroid normal. No bruits, nodes or JVD. Respiratory: Respiratory effort normal.  BS equal and clear bilateral without rales, rhonci, wheezing or stridor. Cardio: Heart sounds are normal with regular rate and rhythm and no murmurs, rubs or gallops.  Peripheral pulses are normal and equal bilaterally without edema. No aortic or femoral bruits. Chest: symmetric with normal excursions and percussion.  Abdomen: Flat, soft  with nl bowel sounds. Nontender, no guarding, rebound, hernias, masses, or organomegaly.  Lymphatics: Non tender without lymphadenopathy.  Musculoskeletal: Full ROM all peripheral extremities, joint stability, 5/5 strength, and normal gait. Skin: Warm and dry without rashes, lesions, cyanosis, clubbing or  ecchymosis.  Neuro: Cranial nerves intact, reflexes equal bilaterally. Normal muscle tone, no cerebellar symptoms. Sensation intact.  Pysch: Awake and oriented X 3, normal affect, Insight and Judgment appropriate.   Cognitive Testing  Alert? Yes  Normal Appearance?Yes  Oriented to person? Yes  Place? Yes   Time? Yes  Recall of three objects?  Yes  Can perform simple calculations? Yes  Displays appropriate judgment? Yes  Can read the correct time from a watch/clock?Yes  Medicare Attestation I have personally reviewed: The patient's medical and social history Their use of alcohol, tobacco or illicit drugs Their current medications and supplements The patient's functional ability including ADLs,fall risks, home safety risks, cognitive, and hearing and visual impairment Diet and physical activities Evidence for depression or mood disorders  The patient's weight, height, BMI, and visual acuity have been recorded in the chart.  I have made referrals, counseling, and provided education to the patient based on review of the above and I have provided the patient with a written personalized care plan for preventive services.    MCKEOWN,WILLIAM DAVID, MD   01/21/2015

## 2015-01-22 DIAGNOSIS — M4806 Spinal stenosis, lumbar region: Secondary | ICD-10-CM | POA: Diagnosis not present

## 2015-01-22 LAB — LIPID PANEL
Cholesterol: 166 mg/dL (ref 0–200)
HDL: 56 mg/dL (ref >=46)
LDL Cholesterol: 77 mg/dL (ref 0–99)
Total CHOL/HDL Ratio: 3 Ratio
Triglycerides: 164 mg/dL — ABNORMAL HIGH (ref <150)
VLDL: 33 mg/dL (ref 0–40)

## 2015-01-22 LAB — BASIC METABOLIC PANEL WITH GFR
BUN: 16 mg/dL (ref 6–23)
CO2: 28 mEq/L (ref 19–32)
Calcium: 9.5 mg/dL (ref 8.4–10.5)
Chloride: 106 mEq/L (ref 96–112)
Creat: 0.88 mg/dL (ref 0.50–1.10)
GFR, Est African American: 72 mL/min
GFR, Est Non African American: 63 mL/min
Glucose, Bld: 151 mg/dL — ABNORMAL HIGH (ref 70–99)
Potassium: 4.5 mEq/L (ref 3.5–5.3)
Sodium: 142 mEq/L (ref 135–145)

## 2015-01-22 LAB — HEPATIC FUNCTION PANEL
ALT: 12 U/L (ref 0–35)
AST: 19 U/L (ref 0–37)
Albumin: 4.1 g/dL (ref 3.5–5.2)
Alkaline Phosphatase: 54 U/L (ref 39–117)
Bilirubin, Direct: 0.1 mg/dL (ref 0.0–0.3)
Indirect Bilirubin: 0.3 mg/dL (ref 0.2–1.2)
Total Bilirubin: 0.4 mg/dL (ref 0.2–1.2)
Total Protein: 6.1 g/dL (ref 6.0–8.3)

## 2015-01-22 LAB — HEMOGLOBIN A1C
Hgb A1c MFr Bld: 6.1 % — ABNORMAL HIGH (ref <5.7)
Mean Plasma Glucose: 128 mg/dL — ABNORMAL HIGH (ref <117)

## 2015-01-22 LAB — VITAMIN D 25 HYDROXY (VIT D DEFICIENCY, FRACTURES): Vit D, 25-Hydroxy: 49 ng/mL (ref 30–100)

## 2015-01-22 LAB — TSH: TSH: 0.739 u[IU]/mL (ref 0.350–4.500)

## 2015-01-22 LAB — MAGNESIUM: Magnesium: 2.2 mg/dL (ref 1.5–2.5)

## 2015-01-22 LAB — INSULIN, FASTING: Insulin fasting, serum: 20.6 u[IU]/mL — ABNORMAL HIGH (ref 2.0–19.6)

## 2015-02-05 ENCOUNTER — Other Ambulatory Visit: Payer: Self-pay | Admitting: Dermatology

## 2015-02-05 DIAGNOSIS — C44619 Basal cell carcinoma of skin of left upper limb, including shoulder: Secondary | ICD-10-CM | POA: Diagnosis not present

## 2015-02-05 DIAGNOSIS — Z85828 Personal history of other malignant neoplasm of skin: Secondary | ICD-10-CM | POA: Diagnosis not present

## 2015-02-05 DIAGNOSIS — L570 Actinic keratosis: Secondary | ICD-10-CM | POA: Diagnosis not present

## 2015-02-05 DIAGNOSIS — L821 Other seborrheic keratosis: Secondary | ICD-10-CM | POA: Diagnosis not present

## 2015-02-05 DIAGNOSIS — Z85820 Personal history of malignant melanoma of skin: Secondary | ICD-10-CM | POA: Diagnosis not present

## 2015-02-28 DIAGNOSIS — C44619 Basal cell carcinoma of skin of left upper limb, including shoulder: Secondary | ICD-10-CM | POA: Diagnosis not present

## 2015-03-05 DIAGNOSIS — M5410 Radiculopathy, site unspecified: Secondary | ICD-10-CM | POA: Diagnosis not present

## 2015-03-05 DIAGNOSIS — M4806 Spinal stenosis, lumbar region: Secondary | ICD-10-CM | POA: Diagnosis not present

## 2015-03-15 DIAGNOSIS — M5416 Radiculopathy, lumbar region: Secondary | ICD-10-CM | POA: Diagnosis not present

## 2015-03-15 DIAGNOSIS — M4806 Spinal stenosis, lumbar region: Secondary | ICD-10-CM | POA: Diagnosis not present

## 2015-03-27 ENCOUNTER — Other Ambulatory Visit: Payer: Self-pay | Admitting: Internal Medicine

## 2015-04-01 DIAGNOSIS — M4806 Spinal stenosis, lumbar region: Secondary | ICD-10-CM | POA: Diagnosis not present

## 2015-04-01 DIAGNOSIS — M5410 Radiculopathy, site unspecified: Secondary | ICD-10-CM | POA: Diagnosis not present

## 2015-04-09 DIAGNOSIS — M545 Low back pain: Secondary | ICD-10-CM | POA: Diagnosis not present

## 2015-04-15 ENCOUNTER — Other Ambulatory Visit: Payer: Self-pay | Admitting: Internal Medicine

## 2015-04-16 DIAGNOSIS — M4806 Spinal stenosis, lumbar region: Secondary | ICD-10-CM | POA: Diagnosis not present

## 2015-04-25 ENCOUNTER — Encounter: Payer: Self-pay | Admitting: Internal Medicine

## 2015-04-25 ENCOUNTER — Ambulatory Visit (INDEPENDENT_AMBULATORY_CARE_PROVIDER_SITE_OTHER): Payer: Medicare Other | Admitting: Internal Medicine

## 2015-04-25 VITALS — BP 158/84 | HR 70 | Temp 98.6°F | Resp 16 | Ht 62.0 in | Wt 160.0 lb

## 2015-04-25 DIAGNOSIS — R7309 Other abnormal glucose: Secondary | ICD-10-CM | POA: Diagnosis not present

## 2015-04-25 DIAGNOSIS — R7303 Prediabetes: Secondary | ICD-10-CM

## 2015-04-25 DIAGNOSIS — I10 Essential (primary) hypertension: Secondary | ICD-10-CM

## 2015-04-25 DIAGNOSIS — Z79899 Other long term (current) drug therapy: Secondary | ICD-10-CM | POA: Diagnosis not present

## 2015-04-25 DIAGNOSIS — E559 Vitamin D deficiency, unspecified: Secondary | ICD-10-CM | POA: Diagnosis not present

## 2015-04-25 DIAGNOSIS — E782 Mixed hyperlipidemia: Secondary | ICD-10-CM | POA: Diagnosis not present

## 2015-04-25 LAB — CBC WITH DIFFERENTIAL/PLATELET
Basophils Absolute: 0 10*3/uL (ref 0.0–0.1)
Basophils Relative: 0 % (ref 0–1)
Eosinophils Absolute: 0.1 10*3/uL (ref 0.0–0.7)
Eosinophils Relative: 2 % (ref 0–5)
HCT: 40.2 % (ref 36.0–46.0)
Hemoglobin: 13.3 g/dL (ref 12.0–15.0)
Lymphocytes Relative: 25 % (ref 12–46)
Lymphs Abs: 1.9 10*3/uL (ref 0.7–4.0)
MCH: 29.6 pg (ref 26.0–34.0)
MCHC: 33.1 g/dL (ref 30.0–36.0)
MCV: 89.5 fL (ref 78.0–100.0)
MPV: 10.8 fL (ref 8.6–12.4)
Monocytes Absolute: 0.7 10*3/uL (ref 0.1–1.0)
Monocytes Relative: 10 % (ref 3–12)
Neutro Abs: 4.7 10*3/uL (ref 1.7–7.7)
Neutrophils Relative %: 63 % (ref 43–77)
Platelets: 212 10*3/uL (ref 150–400)
RBC: 4.49 MIL/uL (ref 3.87–5.11)
RDW: 14.5 % (ref 11.5–15.5)
WBC: 7.4 10*3/uL (ref 4.0–10.5)

## 2015-04-25 LAB — BASIC METABOLIC PANEL WITH GFR
BUN: 17 mg/dL (ref 6–23)
CO2: 28 mEq/L (ref 19–32)
Calcium: 10.3 mg/dL (ref 8.4–10.5)
Chloride: 101 mEq/L (ref 96–112)
Creat: 0.97 mg/dL (ref 0.50–1.10)
GFR, Est African American: 64 mL/min
GFR, Est Non African American: 56 mL/min — ABNORMAL LOW
Glucose, Bld: 97 mg/dL (ref 70–99)
Potassium: 4.4 mEq/L (ref 3.5–5.3)
Sodium: 139 mEq/L (ref 135–145)

## 2015-04-25 LAB — HEPATIC FUNCTION PANEL
ALT: 13 U/L (ref 0–35)
AST: 20 U/L (ref 0–37)
Albumin: 4.2 g/dL (ref 3.5–5.2)
Alkaline Phosphatase: 50 U/L (ref 39–117)
Bilirubin, Direct: 0.1 mg/dL (ref 0.0–0.3)
Indirect Bilirubin: 0.4 mg/dL (ref 0.2–1.2)
Total Bilirubin: 0.5 mg/dL (ref 0.2–1.2)
Total Protein: 6.7 g/dL (ref 6.0–8.3)

## 2015-04-25 LAB — LIPID PANEL
Cholesterol: 183 mg/dL (ref 0–200)
HDL: 54 mg/dL (ref >=46)
LDL Cholesterol: 105 mg/dL — ABNORMAL HIGH (ref 0–99)
Total CHOL/HDL Ratio: 3.4 Ratio
Triglycerides: 122 mg/dL (ref <150)
VLDL: 24 mg/dL (ref 0–40)

## 2015-04-25 LAB — MAGNESIUM: Magnesium: 2.3 mg/dL (ref 1.5–2.5)

## 2015-04-25 LAB — TSH: TSH: 1.002 u[IU]/mL (ref 0.350–4.500)

## 2015-04-25 NOTE — Progress Notes (Signed)
Patient ID: Briana [name], female   DOB: 10/08/1934, 81 y.o.   MRN: [id number]  Assessment and Plan:  Hypertension:  -Continue medication, check at home.  Call if remains elevated -patient is having current anxiety over deciding whether to have surgery or not. -monitor blood pressure at home.  -Continue DASH diet.   -Reminder to go to the ER if any CP, SOB, nausea, dizziness, severe HA, changes vision/speech, left arm numbness and tingling, and jaw pain.  Cholesterol: -Continue diet and exercise.  -Check cholesterol.   Pre-diabetes: -Continue diet and exercise.  -Check A1C  Vitamin D Def: -check level -continue medications.   Surgical Clearance -EKG done here today -clearance form to be filled out pending lab work.  Need for Td -currently out of this in the office -patient given prescription to have performed at pharmacy of her choosing.   Continue diet and meds as discussed. Further disposition pending results of labs.  HPI 81 y.o. female  presents for 3 month follow up with hypertension, hyperlipidemia, prediabetes and vitamin D.   Her blood pressure has been controlled at home, today their BP is BP: (!) 158/84 mmHg.   She does not workout. She denies chest pain, shortness of breath, dizziness.   She is not on cholesterol medication and denies myalgias. Her cholesterol is at goal. The cholesterol last visit was:   Lab Results  Component Value Date   CHOL 166 01/21/2015   HDL 56 01/21/2015   LDLCALC 77 01/21/2015   TRIG 164* 01/21/2015   CHOLHDL 3.0 01/21/2015     She has been working on diet and exercise for prediabetes, and denies foot ulcerations, hyperglycemia, hypoglycemia , increased appetite, nausea, paresthesia of the feet, polydipsia, polyuria, visual disturbances, vomiting and weight loss. Last A1C in the office was:  Lab Results  Component Value Date   HGBA1C 6.1* 01/21/2015  She reports that she has been trying to not eat a lot of bread.    Patient is  on Vitamin D supplement.  Lab Results  Component Value Date   VD25OH 49 01/21/2015     Patient reports that she recently saw Dr. Rolena Infante who told her that she needs to have some back surgery.  She reports that her back pain has kept her very inactive lately.  She reports that her daughters have been helping her a lot.    Current Medications:  Current Outpatient Prescriptions on File Prior to Visit  Medication Sig Dispense Refill  . acetaminophen (TYLENOL) 650 MG CR tablet Take 650 mg by mouth every 8 (eight) hours as needed for pain.    Marland Kitchen albuterol (PROVENTIL HFA;VENTOLIN HFA) 108 (90 BASE) MCG/ACT inhaler Inhale 2 puffs into the lungs every 6 (six) hours as needed for wheezing or shortness of breath. 1 Inhaler 2  . b complex vitamins tablet Take 1 tablet by mouth daily.    . bumetanide (BUMEX) 1 MG tablet TAKE ONE-HALF TO ONE TABLET BY MOUTH TWICE DAILY AS NEEDED FOR SWELLING 90 tablet 0  . Cholecalciferol (VITAMIN D3) 2000 UNITS TABS Take 2,000-4,000 Units by mouth 2 (two) times daily. Take 4000 units in the morning and 2000 units in the evening    . MAGNESIUM PO Take 1 tablet by mouth daily.    . meloxicam (MOBIC) 7.5 MG tablet 1 pill twice daily as needed for joint pain with food 60 tablet 2  . Misc Natural Products (OSTEO BI-FLEX JOINT SHIELD PO) Take by mouth 2 (two) times daily.    Marland Kitchen  propranolol ER (INDERAL LA) 80 MG 24 hr capsule TAKE ONE CAPSULE BY MOUTH ONCE DAILY FOR  TREMORS. 90 capsule 3  . ranitidine (ZANTAC) 300 MG tablet TAKE ONE TABLET BY MOUTH TWICE DAILY FOR  ACID  REFLUX 180 tablet 0  . tamoxifen (NOLVADEX) 20 MG tablet Take 1 tablet (20 mg total) by mouth daily. 90 tablet 1  . vitamin C (ASCORBIC ACID) 500 MG tablet Take 1 tablet (500 mg total) by mouth daily. 50 tablet 0  . VITAMIN E PO Take 2 capsules by mouth daily.     No current facility-administered medications on file prior to visit.    Medical History:  Past Medical History  Diagnosis Date  . Hypertension    . Diverticulitis   . Hyperlipidemia   . Pre-diabetes   . Vitamin D deficiency   . DJD (degenerative joint disease)   . DDD (degenerative disc disease)   . Osteoporosis   . Heart murmur   . Pneumonia   . Diabetes mellitus without complication     "pre-diabetes"  . Varicose veins   . GERD (gastroesophageal reflux disease)     takes ranitidine  . Cancer     skin cancer  . Hx of irritable bowel syndrome   . Occasional tremors     Allergies:  Allergies  Allergen Reactions  . Accupril [Quinapril Hcl] Cough  . Prednisone     Agitated and shaky  . Reglan [Metoclopramide] Other (See Comments)    tremors  . Adhesive [Tape] Rash     Review of Systems:  Review of Systems  Constitutional: Negative for fever, chills, weight loss and malaise/fatigue.  HENT: Negative for congestion and sore throat.   Eyes: Negative.   Respiratory: Negative for cough, shortness of breath and wheezing.   Cardiovascular: Negative for chest pain, palpitations and leg swelling.  Gastrointestinal: Positive for constipation. Negative for heartburn, nausea, vomiting, abdominal pain, diarrhea, blood in stool and melena.  Genitourinary: Negative for dysuria, urgency, frequency and hematuria.  Skin: Negative.   Neurological: Negative for dizziness, loss of consciousness and headaches.  Psychiatric/Behavioral: Negative for depression. The patient is not nervous/anxious and does not have insomnia.   All other systems reviewed and are negative.   Family history- Review and unchanged  Social history- Review and unchanged  Physical Exam: BP 158/84 mmHg  Pulse 70  Temp(Src) 98.6 F (37 C) (Temporal)  Resp 16  Ht 5\' 2"  (1.575 m)  Wt 160 lb (72.576 kg)  BMI 29.26 kg/m2 Wt Readings from Last 3 Encounters:  04/25/15 160 lb (72.576 kg)  01/21/15 163 lb 11.2 oz (74.254 kg)  10/09/14 162 lb (73.483 kg)    General Appearance: Well nourished well developed, in no apparent distress. Eyes: PERRLA, EOMs,  conjunctiva no swelling or erythema ENT/Mouth: Ear canals normal without obstruction, swelling, erythma, discharge.  TMs normal bilaterally.  Oropharynx moist, clear, without exudate, or postoropharyngeal swelling. Neck: Supple, thyroid normal,no cervical adenopathy  Respiratory: Respiratory effort normal, Breath sounds clear A&P without rhonchi, wheeze, or rale.  No retractions, no accessory usage. Cardio: RRR with no MRGs. Brisk peripheral pulses without edema.  Abdomen: Soft, + BS,  Non tender, no guarding, rebound, hernias, masses. Musculoskeletal: Full ROM, 5/5 strength, antalgic shuffling gait with cane Skin: Warm, dry without rashes, lesions, ecchymosis.  Neuro: Awake and oriented X 3, Cranial nerves intact. Normal muscle tone, no cerebellar symptoms. Psych: Normal affect, Insight and Judgment appropriate.    FORCUCCI, COURTNEY, PA-C 10:28 AM [facility]  [facility]

## 2015-04-25 NOTE — Patient Instructions (Signed)
Spinal Fusion Spinal fusion is a procedure to make 2 or more of the bones in your spinal column (vertebrae) grow together (fuse). This procedure stops movement between the vertebrae and can relieve pain and prevent deformity.  Spinal fusion is used to treat the following conditions:  Fractures of the spine.  Herniated disk (the spongy material [cartilage] between the vertebrae).  Abnormal curvatures of the spine, such as scoliosis or kyphosis.  A weak or an unstable spine, caused by infections or tumor. RISKS AND COMPLICATIONS Complications associated with spinal fusion are rare, but they can occur. Possible complications include:  Bleeding.  Infection near the incision.  Nerve damage. Signs of nerve damage are back pain, pain in one or both legs, weakness, or numbness.  Spinal fluid leakage.  Blood clot in your leg, which can move to your lungs.  Difficulty controlling urination or bowel movements. BEFORE THE PROCEDURE  A medical evaluation will be done. This will include a physical exam, blood tests, and imaging exams.  You will talk with an anesthesiologist. This is the person who will be in charge of the anesthesia during the procedure. Spinal fusion usually requires that you are asleep during the procedure (general anesthesia).  You will need to stop taking certain medicines, particularly those associated with an increased risk of bleeding. Ask your caregiver about changing or stopping your regular medicines.  If you smoke, you will need to stop at least 2 weeks before the procedure. Smoking can slow down the healing process, especially fusion of the vertebrae, and increase the risk of complications.  Do not eat or drink anything for at least 8 hours before the procedure. PROCEDURE  A cut (incision) is made over the vertebrae that will be fused. The back muscles are separated from the vertebrae. If you are having this procedure to treat a herniated disk, the disc material  pressing on the nerve root is removed (decompression). The area where the disk is removed is then filled with extra bone. Bone from another part of your body (autogenous bone) or bone from a bone donor (allograft bone) may be used. The extra bone promotes fusion between the vertebrae. Sometimes, specific medicines are added to the fusion area to promote bone healing. In most cases, screws and rods or metal plates will be used to attach the vertebrae to stabilize them while they fuse.  AFTER THE PROCEDURE   You will stay in a recovery area until the anesthesia has worn off. Your blood pressure and pulse will be checked frequently.  You will be given antibiotics to prevent infection.  You may continue to receive fluids through an intravenous (IV) tube while you are still in the hospital.  Pain after surgery is normal. You will be given pain medicine.  You will be taught how to move correctly and how to stand and walk. While in bed, you will be instructed to turn frequently, using a "log rolling" technique, in which the entire body is moved without twisting the back. Document Released: 08/01/2003 Document Revised: 01/25/2012 Document Reviewed: 01/15/2011 [facility] Patient Information 2015 [place], [place]. This information is not intended to replace advice given to you by your health care provider. Make sure you discuss any questions you have with your health care provider.

## 2015-04-26 LAB — HEMOGLOBIN A1C
Hgb A1c MFr Bld: 6.2 % — ABNORMAL HIGH (ref <5.7)
Mean Plasma Glucose: 131 mg/dL — ABNORMAL HIGH (ref <117)

## 2015-04-26 LAB — VITAMIN D 25 HYDROXY (VIT D DEFICIENCY, FRACTURES): Vit D, 25-Hydroxy: 66 ng/mL (ref 30–100)

## 2015-04-26 LAB — INSULIN, RANDOM: Insulin: 5.1 u[IU]/mL (ref 2.0–19.6)

## 2015-04-28 NOTE — Addendum Note (Signed)
Addended by: HELMER, REBECCA A on: 04/28/2015 03:19 PM   Modules accepted: Orders

## 2015-04-29 ENCOUNTER — Other Ambulatory Visit: Payer: Self-pay | Admitting: Internal Medicine

## 2015-05-01 ENCOUNTER — Encounter: Payer: Self-pay | Admitting: Gastroenterology

## 2015-05-06 ENCOUNTER — Other Ambulatory Visit: Payer: Self-pay | Admitting: Internal Medicine

## 2015-05-29 NOTE — Pre-Procedure Instructions (Signed)
Briana [name]  05/29/2015      [facility] - [place] [address] [place] [id number] [place] ([place]) [place] [address] Phone: [phone] Fax: [phone]    Your procedure is scheduled on Wednesday, June 05, 2015  Report to [facility] Admitting at 11:00 A.M.  Call this number if you have problems the morning of surgery:  [phone]   Remember: Patient to bring brace  Do not eat food or drink liquids after midnight Tuesday, June 04, 2015  Take these medicines the morning of surgery with A SIP OF WATER : propranolol ER (INDERAL LA), tamoxifen (NOLVADEX), ranitidine (ZANTAC)   if needed: acetaminophen (TYLENOL) for pain, albuterol (PROVENTIL HFA;VENTOLIN HFA)  Inhaler for wheezing or shortness of breath ( bring inhaler in with you on day of procedure).     Stop taking Aspirin, vitamins and herbal medications such as OSTEO BI-FLEX JOINT SHIELD. Do not take any NSAIDs ie: Ibuprofen, Advil, Naproxen or any medication containing Aspirin such as meloxicam (MOBIC); stop now.   Do not wear jewelry, make-up or nail polish.  Do not wear lotions, powders, or perfumes.  You may not wear deodorant.  Do not shave 48 hours prior to surgery.    Do not bring valuables to the hospital.  [facility] is not responsible for any belongings or valuables.  Contacts, dentures or bridgework may not be worn into surgery.  Leave your suitcase in the car.  After surgery it may be brought to your room.  For patients admitted to the hospital, discharge time will be determined by your treatment team.  Patients discharged the day of surgery will not be allowed to drive home.   Name and phone number of your driver:   Special instructions: Special Instructions:Special Instructions: [facility] - Preparing for Surgery  Before surgery, you can play an important role.  Because skin is not sterile, your skin needs to be as free of germs as possible.  You can reduce the  number of germs on you skin by washing with CHG (chlorahexidine gluconate) soap before surgery.  CHG is an antiseptic cleaner which kills germs and bonds with the skin to continue killing germs even after washing.  Please DO NOT use if you have an allergy to CHG or antibacterial soaps.  If your skin becomes reddened/irritated stop using the CHG and inform your nurse when you arrive at Short Stay.  Do not shave (including legs and underarms) for at least 48 hours prior to the first CHG shower.  You may shave your face.  Please follow these instructions carefully:   1.  Shower with CHG Soap the night before surgery and the morning of Surgery.  2.  If you choose to wash your hair, wash your hair first as usual with your normal shampoo.  3.  After you shampoo, rinse your hair and body thoroughly to remove the Shampoo.  4.  Use CHG as you would any other liquid soap.  You can apply chg directly  to the skin and wash gently with scrungie or a clean washcloth.  5.  Apply the CHG Soap to your body ONLY FROM THE NECK DOWN.  Do not use on open wounds or open sores.  Avoid contact with your eyes, ears, mouth and genitals (private parts).  Wash genitals (private parts) with your normal soap.  6.  Wash thoroughly, paying special attention to the area where your surgery will be performed.  7.  Thoroughly rinse your body with warm water from the neck down.  8.  DO NOT shower/wash with your normal soap after using and rinsing off the CHG Soap.  9.  Pat yourself dry with a clean towel.            10.  Wear clean pajamas.            11.  Place clean sheets on your bed the night of your first shower and do not sleep with pets.  Day of Surgery  Do not apply any lotions/deodorants the morning of surgery.  Please wear clean clothes to the hospital/surgery center.  Please read over the following fact sheets that you were given. Pain Booklet, Coughing and Deep Breathing, MRSA Information and Surgical Site Infection  Prevention

## 2015-05-30 ENCOUNTER — Encounter (HOSPITAL_COMMUNITY)
Admission: RE | Admit: 2015-05-30 | Discharge: 2015-05-30 | Disposition: A | Discharge status: Home / Self Care | Payer: Medicare Other | Source: Ambulatory Visit | Attending: Orthopedic Surgery | Admitting: Orthopedic Surgery

## 2015-05-30 ENCOUNTER — Encounter (HOSPITAL_COMMUNITY): Payer: Self-pay

## 2015-05-30 DIAGNOSIS — K219 Gastro-esophageal reflux disease without esophagitis: Secondary | ICD-10-CM | POA: Diagnosis not present

## 2015-05-30 DIAGNOSIS — Z7982 Long term (current) use of aspirin: Secondary | ICD-10-CM | POA: Diagnosis not present

## 2015-05-30 DIAGNOSIS — Z01812 Encounter for preprocedural laboratory examination: Secondary | ICD-10-CM | POA: Insufficient documentation

## 2015-05-30 DIAGNOSIS — R7309 Other abnormal glucose: Secondary | ICD-10-CM | POA: Insufficient documentation

## 2015-05-30 DIAGNOSIS — Z01818 Encounter for other preprocedural examination: Secondary | ICD-10-CM | POA: Insufficient documentation

## 2015-05-30 DIAGNOSIS — Z79899 Other long term (current) drug therapy: Secondary | ICD-10-CM | POA: Insufficient documentation

## 2015-05-30 DIAGNOSIS — Z794 Long term (current) use of insulin: Secondary | ICD-10-CM | POA: Diagnosis not present

## 2015-05-30 DIAGNOSIS — E785 Hyperlipidemia, unspecified: Secondary | ICD-10-CM | POA: Insufficient documentation

## 2015-05-30 DIAGNOSIS — Z85828 Personal history of other malignant neoplasm of skin: Secondary | ICD-10-CM | POA: Diagnosis not present

## 2015-05-30 DIAGNOSIS — E109 Type 1 diabetes mellitus without complications: Secondary | ICD-10-CM | POA: Diagnosis not present

## 2015-05-30 DIAGNOSIS — M4806 Spinal stenosis, lumbar region: Secondary | ICD-10-CM | POA: Diagnosis not present

## 2015-05-30 DIAGNOSIS — E780 Pure hypercholesterolemia: Secondary | ICD-10-CM | POA: Diagnosis not present

## 2015-05-30 DIAGNOSIS — M810 Age-related osteoporosis without current pathological fracture: Secondary | ICD-10-CM | POA: Diagnosis not present

## 2015-05-30 DIAGNOSIS — Z96642 Presence of left artificial hip joint: Secondary | ICD-10-CM | POA: Diagnosis not present

## 2015-05-30 DIAGNOSIS — K589 Irritable bowel syndrome without diarrhea: Secondary | ICD-10-CM | POA: Diagnosis not present

## 2015-05-30 HISTORY — DX: Personal history of other diseases of the digestive system: Z87.19

## 2015-05-30 HISTORY — DX: Personal history of pneumonia (recurrent): Z87.01

## 2015-05-30 LAB — BASIC METABOLIC PANEL
Anion gap: 6 (ref 5–15)
BUN: 16 mg/dL (ref 6–20)
CO2: 30 mmol/L (ref 22–32)
Calcium: 10.1 mg/dL (ref 8.9–10.3)
Chloride: 105 mmol/L (ref 101–111)
Creatinine, Ser: 0.98 mg/dL (ref 0.44–1.00)
GFR calc Af Amer: >60 mL/min (ref >60)
GFR calc non Af Amer: 53 mL/min — ABNORMAL LOW (ref >60)
Glucose, Bld: 114 mg/dL — ABNORMAL HIGH (ref 65–99)
Potassium: 4.2 mmol/L (ref 3.5–5.1)
Sodium: 141 mmol/L (ref 135–145)

## 2015-05-30 LAB — CBC
HCT: 41.6 % (ref 36.0–46.0)
Hemoglobin: 13.7 g/dL (ref 12.0–15.0)
MCH: 30 pg (ref 26.0–34.0)
MCHC: 32.9 g/dL (ref 30.0–36.0)
MCV: 91.2 fL (ref 78.0–100.0)
Platelets: 185 10*3/uL (ref 150–400)
RBC: 4.56 MIL/uL (ref 3.87–5.11)
RDW: 14 % (ref 11.5–15.5)
WBC: 6.1 10*3/uL (ref 4.0–10.5)

## 2015-05-30 LAB — GLUCOSE, CAPILLARY: Glucose-Capillary: 134 mg/dL — ABNORMAL HIGH (ref 65–99)

## 2015-05-30 LAB — SURGICAL PCR SCREEN
MRSA, PCR: NEGATIVE
Staphylococcus aureus: POSITIVE — AB

## 2015-05-30 MED ORDER — CEFAZOLIN SODIUM-DEXTROSE 2-3 GM-% IV SOLR: 2.0000 g | INTRAVENOUS | Status: DC

## 2015-05-30 NOTE — Progress Notes (Signed)
Anesthesia Chart Review:  Pt is 81 year old female scheduled for L3-5 decompression on 06/05/2015 with Dr. Rolena Infante.   PCP is Dr. Unk Pinto.   PMH includes: heart murmur (not specified, no murmur mentioned at last PCP visit 04/25/15), pre-diabetes, hyperlipidemia, GERD. Never smoker. BMI 29. S/p ORIF R distal radial fracture 01/10/14.   Medications include: albuterol, ASA, bumex, propranolol (for tremor), zantac, tamoxifen.   Preoperative labs reviewed.    Chest x-ray 07/16/2014 reviewed. No acute abnormality seen.   EKG 04/25/2015: sinus bradycardia (59 bpm). LAD. Suspect anterior infarction. No significant change from previous tracing dated 06/13/2014.   Pt has medical clearance from Dr. Melford Aase (see media tab 05/08/15).   If no changes, I anticipate pt can proceed with surgery as scheduled.   Willeen Cass, FNP-BC [facility]/Anesthesiology Phone: [phone] 05/30/2015 3:15 PM

## 2015-05-30 NOTE — Progress Notes (Signed)
PCP- Dr. Unk Pinto  Cardiologist - denies CXR- 07/16/14 - Epic EKG - 04/25/15 - Epic  Echo/Stress Test/Card. Cath - denies

## 2015-05-30 NOTE — Progress Notes (Signed)
Patient notified of positive PCR screening.  Patient verbalized understanding and stated that she would pick up prescription and begin treatment.  Prescription was called in to [facility] on [address].

## 2015-06-03 DIAGNOSIS — M4806 Spinal stenosis, lumbar region: Secondary | ICD-10-CM | POA: Diagnosis not present

## 2015-06-04 NOTE — Anesthesia Preprocedure Evaluation (Addendum)
Anesthesia Evaluation  Patient identified by MRN, date of birth, ID band Patient awake    Reviewed: Allergy & Precautions, NPO status , Patient's Chart, lab work & pertinent test results, reviewed documented beta blocker date and time   Airway Mallampati: II   Neck ROM: Full    Dental  (+) Teeth Intact, Dental Advisory Given   Pulmonary  breath sounds clear to auscultation        Cardiovascular hypertension, Pt. on medications Rhythm:Regular  EKG OK, Left axis   Neuro/Psych negative neurological ROS  negative psych ROS   GI/Hepatic Neg liver ROS, GERD-  Medicated,  Endo/Other  diabetes, Type 2  Renal/GU      Musculoskeletal   Abdominal (+)  Abdomen: soft.    Peds  Hematology 14/41   Anesthesia Other Findings   Reproductive/Obstetrics                          Anesthesia Physical Anesthesia Plan  ASA: II  Anesthesia Plan: General   Post-op Pain Management:    Induction: Intravenous  Airway Management Planned: Oral ETT  Additional Equipment:   Intra-op Plan:   Post-operative Plan: Extubation in OR  Informed Consent: I have reviewed the patients History and Physical, chart, labs and discussed the procedure including the risks, benefits and alternatives for the proposed anesthesia with the patient or authorized representative who has indicated his/her understanding and acceptance.     Plan Discussed with:   Anesthesia Plan Comments: (Multimodal pain RX)        Anesthesia Quick Evaluation

## 2015-06-05 ENCOUNTER — Encounter (HOSPITAL_COMMUNITY)
Admission: AD | Disposition: A | Discharge status: Skilled Nursing Facility | Payer: Self-pay | Source: Ambulatory Visit | Attending: Orthopedic Surgery

## 2015-06-05 ENCOUNTER — Inpatient Hospital Stay (HOSPITAL_COMMUNITY)
Admission: AD | Admit: 2015-06-05 | Discharge: 2015-06-07 | DRG: 517 | Disposition: A | Discharge status: Skilled Nursing Facility | Payer: Medicare Other | Source: Ambulatory Visit | Attending: Orthopedic Surgery | Admitting: Orthopedic Surgery

## 2015-06-05 ENCOUNTER — Ambulatory Visit (HOSPITAL_COMMUNITY): Payer: Medicare Other | Admitting: Emergency Medicine

## 2015-06-05 ENCOUNTER — Ambulatory Visit (HOSPITAL_COMMUNITY): Payer: Medicare Other

## 2015-06-05 ENCOUNTER — Ambulatory Visit (HOSPITAL_COMMUNITY): Payer: Medicare Other | Admitting: Anesthesiology

## 2015-06-05 ENCOUNTER — Encounter (HOSPITAL_COMMUNITY): Payer: Self-pay | Admitting: *Deleted

## 2015-06-05 DIAGNOSIS — Z7982 Long term (current) use of aspirin: Secondary | ICD-10-CM

## 2015-06-05 DIAGNOSIS — K219 Gastro-esophageal reflux disease without esophagitis: Secondary | ICD-10-CM | POA: Diagnosis present

## 2015-06-05 DIAGNOSIS — Z4889 Encounter for other specified surgical aftercare: Secondary | ICD-10-CM | POA: Diagnosis not present

## 2015-06-05 DIAGNOSIS — E780 Pure hypercholesterolemia: Secondary | ICD-10-CM | POA: Diagnosis present

## 2015-06-05 DIAGNOSIS — E785 Hyperlipidemia, unspecified: Secondary | ICD-10-CM | POA: Diagnosis not present

## 2015-06-05 DIAGNOSIS — M4806 Spinal stenosis, lumbar region: Principal | ICD-10-CM | POA: Diagnosis present

## 2015-06-05 DIAGNOSIS — Z85828 Personal history of other malignant neoplasm of skin: Secondary | ICD-10-CM

## 2015-06-05 DIAGNOSIS — Z794 Long term (current) use of insulin: Secondary | ICD-10-CM | POA: Diagnosis not present

## 2015-06-05 DIAGNOSIS — M6281 Muscle weakness (generalized): Secondary | ICD-10-CM | POA: Diagnosis not present

## 2015-06-05 DIAGNOSIS — E109 Type 1 diabetes mellitus without complications: Secondary | ICD-10-CM | POA: Diagnosis present

## 2015-06-05 DIAGNOSIS — Z79899 Other long term (current) drug therapy: Secondary | ICD-10-CM | POA: Diagnosis not present

## 2015-06-05 DIAGNOSIS — K589 Irritable bowel syndrome without diarrhea: Secondary | ICD-10-CM | POA: Diagnosis not present

## 2015-06-05 DIAGNOSIS — Z96642 Presence of left artificial hip joint: Secondary | ICD-10-CM | POA: Diagnosis not present

## 2015-06-05 DIAGNOSIS — M48061 Spinal stenosis, lumbar region without neurogenic claudication: Secondary | ICD-10-CM | POA: Diagnosis present

## 2015-06-05 DIAGNOSIS — M810 Age-related osteoporosis without current pathological fracture: Secondary | ICD-10-CM | POA: Diagnosis present

## 2015-06-05 DIAGNOSIS — Z419 Encounter for procedure for purposes other than remedying health state, unspecified: Secondary | ICD-10-CM

## 2015-06-05 DIAGNOSIS — R2681 Unsteadiness on feet: Secondary | ICD-10-CM | POA: Diagnosis not present

## 2015-06-05 DIAGNOSIS — E119 Type 2 diabetes mellitus without complications: Secondary | ICD-10-CM | POA: Diagnosis not present

## 2015-06-05 DIAGNOSIS — Z981 Arthrodesis status: Secondary | ICD-10-CM | POA: Diagnosis not present

## 2015-06-05 DIAGNOSIS — M549 Dorsalgia, unspecified: Secondary | ICD-10-CM | POA: Diagnosis present

## 2015-06-05 DIAGNOSIS — M961 Postlaminectomy syndrome, not elsewhere classified: Secondary | ICD-10-CM | POA: Diagnosis not present

## 2015-06-05 DIAGNOSIS — M5135 Other intervertebral disc degeneration, thoracolumbar region: Secondary | ICD-10-CM | POA: Diagnosis not present

## 2015-06-05 DIAGNOSIS — R278 Other lack of coordination: Secondary | ICD-10-CM | POA: Diagnosis not present

## 2015-06-05 HISTORY — PX: SPINAL CORD DECOMPRESSION: SHX97

## 2015-06-05 HISTORY — PX: LUMBAR LAMINECTOMY/DECOMPRESSION MICRODISCECTOMY: SHX5026

## 2015-06-05 LAB — GLUCOSE, CAPILLARY: Glucose-Capillary: 104 mg/dL — ABNORMAL HIGH (ref 65–99)

## 2015-06-05 SURGERY — LUMBAR LAMINECTOMY/DECOMPRESSION MICRODISCECTOMY 2 LEVELS
Anesthesia: General | Site: Back

## 2015-06-05 MED ORDER — PROPOFOL 10 MG/ML IV BOLUS
INTRAVENOUS | Status: DC | PRN
  Administered 2015-06-05: 50 mg via INTRAVENOUS
  Administered 2015-06-05: 130 mg via INTRAVENOUS

## 2015-06-05 MED ORDER — ONDANSETRON HCL 4 MG/2ML IJ SOLN: 4.0000 mg | Freq: Four times a day (QID) | INTRAMUSCULAR | Status: DC | PRN

## 2015-06-05 MED ORDER — VANCOMYCIN HCL 500 MG IV SOLR
INTRAVENOUS | Status: DC | PRN
  Administered 2015-06-05: 500 mg

## 2015-06-05 MED ORDER — LIDOCAINE HCL (CARDIAC) 20 MG/ML IV SOLN
INTRAVENOUS | Status: AC
  Filled 2015-06-05: qty 5

## 2015-06-05 MED ORDER — LIDOCAINE HCL (CARDIAC) 20 MG/ML IV SOLN
INTRAVENOUS | Status: DC | PRN
  Administered 2015-06-05: 100 mg via INTRAVENOUS

## 2015-06-05 MED ORDER — SODIUM CHLORIDE 0.9 % IV SOLN: 250.0000 mL | INTRAVENOUS | Status: DC

## 2015-06-05 MED ORDER — STERILE WATER FOR INJECTION IJ SOLN
INTRAMUSCULAR | Status: AC
  Filled 2015-06-05: qty 10

## 2015-06-05 MED ORDER — ONDANSETRON HCL 4 MG/2ML IJ SOLN
4.0000 mg | INTRAMUSCULAR | Status: DC | PRN
Start: 2015-06-05 — End: 2015-06-05

## 2015-06-05 MED ORDER — METHOCARBAMOL 1000 MG/10ML IJ SOLN
500.0000 mg | Freq: Four times a day (QID) | INTRAVENOUS | Status: DC | PRN
  Filled 2015-06-05: qty 5

## 2015-06-05 MED ORDER — TAMOXIFEN CITRATE 20 MG PO TABS: 20.0000 mg | ORAL_TABLET | Freq: Every day | ORAL | Status: DC

## 2015-06-05 MED ORDER — GLYCOPYRROLATE 0.2 MG/ML IJ SOLN
INTRAMUSCULAR | Status: AC
  Filled 2015-06-05: qty 2

## 2015-06-05 MED ORDER — MORPHINE SULFATE 2 MG/ML IJ SOLN
1.0000 mg | INTRAMUSCULAR | Status: DC | PRN
  Administered 2015-06-06 – 2015-06-07 (×5): 2 mg via INTRAVENOUS
  Filled 2015-06-05 (×5): qty 1

## 2015-06-05 MED ORDER — FENTANYL CITRATE (PF) 100 MCG/2ML IJ SOLN
INTRAMUSCULAR | Status: AC
  Administered 2015-06-05: 50 ug via INTRAVENOUS
  Filled 2015-06-05: qty 2

## 2015-06-05 MED ORDER — VANCOMYCIN HCL 500 MG IV SOLR
INTRAVENOUS | Status: AC
  Filled 2015-06-05: qty 500

## 2015-06-05 MED ORDER — TAMOXIFEN CITRATE 10 MG PO TABS
20.0000 mg | ORAL_TABLET | Freq: Every day | ORAL | Status: DC
  Administered 2015-06-06 – 2015-06-07 (×2): 20 mg via ORAL
  Filled 2015-06-05 (×2): qty 2

## 2015-06-05 MED ORDER — HEMOSTATIC AGENTS (NO CHARGE) OPTIME
TOPICAL | Status: DC | PRN
  Administered 2015-06-05: 1 via TOPICAL

## 2015-06-05 MED ORDER — THROMBIN 20000 UNITS EX SOLR
CUTANEOUS | Status: AC
  Filled 2015-06-05: qty 20000

## 2015-06-05 MED ORDER — MENTHOL 3 MG MT LOZG: 1.0000 | LOZENGE | OROMUCOSAL | Status: DC | PRN

## 2015-06-05 MED ORDER — ALBUTEROL SULFATE (2.5 MG/3ML) 0.083% IN NEBU: 2.5000 mg | INHALATION_SOLUTION | Freq: Four times a day (QID) | RESPIRATORY_TRACT | Status: DC | PRN

## 2015-06-05 MED ORDER — GLYCOPYRROLATE 0.2 MG/ML IJ SOLN
INTRAMUSCULAR | Status: DC | PRN
  Administered 2015-06-05: 0.4 mg via INTRAVENOUS

## 2015-06-05 MED ORDER — FENTANYL CITRATE (PF) 100 MCG/2ML IJ SOLN
INTRAMUSCULAR | Status: DC | PRN
  Administered 2015-06-05 (×2): 50 ug via INTRAVENOUS
  Administered 2015-06-05: 100 ug via INTRAVENOUS

## 2015-06-05 MED ORDER — ACETAMINOPHEN 10 MG/ML IV SOLN
1000.0000 mg | Freq: Four times a day (QID) | INTRAVENOUS | Status: AC
  Administered 2015-06-05 – 2015-06-06 (×4): 1000 mg via INTRAVENOUS
  Filled 2015-06-05 (×4): qty 100

## 2015-06-05 MED ORDER — SODIUM CHLORIDE 0.9 % IJ SOLN: 3.0000 mL | INTRAMUSCULAR | Status: DC | PRN

## 2015-06-05 MED ORDER — PROMETHAZINE HCL 25 MG/ML IJ SOLN: 6.2500 mg | INTRAMUSCULAR | Status: DC | PRN

## 2015-06-05 MED ORDER — MEPERIDINE HCL 25 MG/ML IJ SOLN: 6.2500 mg | INTRAMUSCULAR | Status: DC | PRN

## 2015-06-05 MED ORDER — PHENOL 1.4 % MT LIQD
1.0000 | OROMUCOSAL | Status: DC | PRN
  Administered 2015-06-05: 1 via OROMUCOSAL

## 2015-06-05 MED ORDER — METHOCARBAMOL 500 MG PO TABS
ORAL_TABLET | ORAL | Status: AC
  Filled 2015-06-05: qty 1

## 2015-06-05 MED ORDER — PROPRANOLOL HCL ER 80 MG PO CP24
80.0000 mg | ORAL_CAPSULE | Freq: Every day | ORAL | Status: DC
  Administered 2015-06-06 – 2015-06-07 (×2): 80 mg via ORAL
  Filled 2015-06-05 (×2): qty 1

## 2015-06-05 MED ORDER — GELATIN ABSORBABLE MT POWD
OROMUCOSAL | Status: DC | PRN
  Administered 2015-06-05: 20 mL via TOPICAL

## 2015-06-05 MED ORDER — ONDANSETRON HCL 4 MG/2ML IJ SOLN
INTRAMUSCULAR | Status: AC
  Filled 2015-06-05: qty 2

## 2015-06-05 MED ORDER — MIDAZOLAM HCL 2 MG/2ML IJ SOLN
INTRAMUSCULAR | Status: AC
  Filled 2015-06-05: qty 2

## 2015-06-05 MED ORDER — LACTATED RINGERS IV SOLN
INTRAVENOUS | Status: DC
  Administered 2015-06-05: 12:00:00 via INTRAVENOUS

## 2015-06-05 MED ORDER — ROCURONIUM BROMIDE 50 MG/5ML IV SOLN
INTRAVENOUS | Status: AC
  Filled 2015-06-05: qty 1

## 2015-06-05 MED ORDER — MAGNESIUM CITRATE PO SOLN
0.5000 | Freq: Once | ORAL | Status: DC
  Filled 2015-06-05: qty 296

## 2015-06-05 MED ORDER — ONDANSETRON HCL 4 MG/2ML IJ SOLN
INTRAMUSCULAR | Status: DC | PRN
  Administered 2015-06-05: 4 mg via INTRAVENOUS

## 2015-06-05 MED ORDER — CEFAZOLIN SODIUM-DEXTROSE 2-3 GM-% IV SOLR
2.0000 g | Freq: Once | INTRAVENOUS | Status: AC
  Administered 2015-06-05: 2 g via INTRAVENOUS
  Filled 2015-06-05: qty 50

## 2015-06-05 MED ORDER — NEOSTIGMINE METHYLSULFATE 10 MG/10ML IV SOLN
INTRAVENOUS | Status: AC
  Filled 2015-06-05: qty 1

## 2015-06-05 MED ORDER — LACTATED RINGERS IV SOLN
INTRAVENOUS | Status: DC | PRN
  Administered 2015-06-05 (×2): via INTRAVENOUS

## 2015-06-05 MED ORDER — CEFAZOLIN SODIUM-DEXTROSE 2-3 GM-% IV SOLR
INTRAVENOUS | Status: AC
  Filled 2015-06-05: qty 50

## 2015-06-05 MED ORDER — FLEET ENEMA 7-19 GM/118ML RE ENEM: 1.0000 | ENEMA | Freq: Once | RECTAL | Status: DC

## 2015-06-05 MED ORDER — BUPIVACAINE-EPINEPHRINE (PF) 0.25% -1:200000 IJ SOLN
INTRAMUSCULAR | Status: AC
  Filled 2015-06-05: qty 30

## 2015-06-05 MED ORDER — ALBUTEROL SULFATE HFA 108 (90 BASE) MCG/ACT IN AERS: 2.0000 | INHALATION_SPRAY | Freq: Four times a day (QID) | RESPIRATORY_TRACT | Status: DC | PRN

## 2015-06-05 MED ORDER — SUCCINYLCHOLINE CHLORIDE 20 MG/ML IJ SOLN
INTRAMUSCULAR | Status: AC
  Filled 2015-06-05: qty 1

## 2015-06-05 MED ORDER — BUPIVACAINE-EPINEPHRINE (PF) 0.25% -1:200000 IJ SOLN
INTRAMUSCULAR | Status: DC | PRN
  Administered 2015-06-05: 10 mL via PERINEURAL

## 2015-06-05 MED ORDER — VECURONIUM BROMIDE 10 MG IV SOLR
INTRAVENOUS | Status: DC | PRN
  Administered 2015-06-05: 5 mg via INTRAVENOUS
  Administered 2015-06-05: 1 mg via INTRAVENOUS

## 2015-06-05 MED ORDER — ACETAMINOPHEN 10 MG/ML IV SOLN
1000.0000 mg | INTRAVENOUS | Status: AC
  Administered 2015-06-05: 1000 mg via INTRAVENOUS
  Filled 2015-06-05: qty 100

## 2015-06-05 MED ORDER — SODIUM CHLORIDE 0.9 % IJ SOLN
3.0000 mL | Freq: Two times a day (BID) | INTRAMUSCULAR | Status: DC
  Administered 2015-06-06: 3 mL via INTRAVENOUS

## 2015-06-05 MED ORDER — TRAMADOL HCL 50 MG PO TABS
50.0000 mg | ORAL_TABLET | Freq: Four times a day (QID) | ORAL | Status: DC
  Filled 2015-06-05: qty 1

## 2015-06-05 MED ORDER — EPHEDRINE SULFATE 50 MG/ML IJ SOLN
INTRAMUSCULAR | Status: DC | PRN
  Administered 2015-06-05 (×3): 5 mg via INTRAVENOUS

## 2015-06-05 MED ORDER — CEFAZOLIN SODIUM 1-5 GM-% IV SOLN
1.0000 g | Freq: Three times a day (TID) | INTRAVENOUS | Status: AC
  Administered 2015-06-05 – 2015-06-06 (×2): 1 g via INTRAVENOUS
  Filled 2015-06-05 (×2): qty 50

## 2015-06-05 MED ORDER — METHOCARBAMOL 500 MG PO TABS
500.0000 mg | ORAL_TABLET | Freq: Four times a day (QID) | ORAL | Status: DC | PRN
  Administered 2015-06-05 – 2015-06-07 (×2): 500 mg via ORAL
  Filled 2015-06-05: qty 1

## 2015-06-05 MED ORDER — PROPOFOL 10 MG/ML IV BOLUS
INTRAVENOUS | Status: AC
  Filled 2015-06-05: qty 20

## 2015-06-05 MED ORDER — LACTATED RINGERS IV SOLN
INTRAVENOUS | Status: DC
  Administered 2015-06-05: 18:00:00 via INTRAVENOUS

## 2015-06-05 MED ORDER — VECURONIUM BROMIDE 10 MG IV SOLR
INTRAVENOUS | Status: AC
  Filled 2015-06-05: qty 10

## 2015-06-05 MED ORDER — TRAMADOL 5 MG/ML ORAL SUSPENSION: 50.0000 mg | Freq: Four times a day (QID) | ORAL | Status: DC

## 2015-06-05 MED ORDER — FENTANYL CITRATE (PF) 100 MCG/2ML IJ SOLN
25.0000 ug | INTRAMUSCULAR | Status: DC | PRN
  Administered 2015-06-05 (×2): 50 ug via INTRAVENOUS

## 2015-06-05 MED ORDER — FENTANYL CITRATE (PF) 250 MCG/5ML IJ SOLN
INTRAMUSCULAR | Status: AC
  Filled 2015-06-05: qty 5

## 2015-06-05 MED ORDER — NEOSTIGMINE METHYLSULFATE 10 MG/10ML IV SOLN
INTRAVENOUS | Status: DC | PRN
  Administered 2015-06-05: 3 mg via INTRAVENOUS

## 2015-06-05 SURGICAL SUPPLY — 58 items
BNDG GAUZE ELAST 4 BULKY (GAUZE/BANDAGES/DRESSINGS) ×1 IMPLANT
BUR EGG ELITE 4.0 (BURR) IMPLANT
CLSR STERI-STRIP ANTIMIC 1/2X4 (GAUZE/BANDAGES/DRESSINGS) ×1 IMPLANT
CORDS BIPOLAR (ELECTRODE) ×2 IMPLANT
DRAPE C-ARM 42X72 X-RAY (DRAPES) ×1 IMPLANT
DRAPE POUCH INSTRU U-SHP 10X18 (DRAPES) ×2 IMPLANT
DRAPE SURG 17X11 SM STRL (DRAPES) ×2 IMPLANT
DRAPE U-SHAPE 47X51 STRL (DRAPES) ×2 IMPLANT
DRSG MEPILEX BORDER 4X4 (GAUZE/BANDAGES/DRESSINGS) ×1 IMPLANT
DRSG MEPILEX BORDER 4X8 (GAUZE/BANDAGES/DRESSINGS) ×1 IMPLANT
DURAPREP 26ML APPLICATOR (WOUND CARE) ×2 IMPLANT
ELECT BLADE 4.0 EZ CLEAN MEGAD (MISCELLANEOUS) ×2
ELECT CAUTERY BLADE 6.4 (BLADE) ×2 IMPLANT
ELECT PENCIL ROCKER SW 15FT (MISCELLANEOUS) ×2 IMPLANT
ELECT REM PT RETURN 9FT ADLT (ELECTROSURGICAL) ×2
ELECTRODE BLDE 4.0 EZ CLN MEGD (MISCELLANEOUS) ×1 IMPLANT
ELECTRODE REM PT RTRN 9FT ADLT (ELECTROSURGICAL) ×1 IMPLANT
FLOSEAL (HEMOSTASIS) ×1 IMPLANT
GLOVE BIOGEL PI IND STRL 8 (GLOVE) ×1 IMPLANT
GLOVE BIOGEL PI IND STRL 8.5 (GLOVE) ×1 IMPLANT
GLOVE BIOGEL PI INDICATOR 8 (GLOVE) ×1
GLOVE BIOGEL PI INDICATOR 8.5 (GLOVE) ×1
GLOVE ORTHO TXT STRL SZ7.5 (GLOVE) ×2 IMPLANT
GLOVE SS BIOGEL STRL SZ 8.5 (GLOVE) ×1 IMPLANT
GLOVE SUPERSENSE BIOGEL SZ 8.5 (GLOVE) ×1
GOWN STRL REUS W/ TWL LRG LVL3 (GOWN DISPOSABLE) ×1 IMPLANT
GOWN STRL REUS W/TWL 2XL LVL3 (GOWN DISPOSABLE) ×3 IMPLANT
GOWN STRL REUS W/TWL LRG LVL3 (GOWN DISPOSABLE) ×2
KIT BASIN OR (CUSTOM PROCEDURE TRAY) ×2 IMPLANT
KIT STIMULAN RAPID CURE 5CC (Orthopedic Implant) ×1 IMPLANT
NDL SPNL 18GX3.5 QUINCKE PK (NEEDLE) ×2 IMPLANT
NEEDLE 22X1 1/2 (OR ONLY) (NEEDLE) ×2 IMPLANT
NEEDLE SPNL 18GX3.5 QUINCKE PK (NEEDLE) ×4 IMPLANT
NS IRRIG 1000ML POUR BTL (IV SOLUTION) ×2 IMPLANT
PACK LAMINECTOMY ORTHO (CUSTOM PROCEDURE TRAY) ×2 IMPLANT
PACK UNIVERSAL I (CUSTOM PROCEDURE TRAY) ×2 IMPLANT
PATTIES SURGICAL .5 X.5 (GAUZE/BANDAGES/DRESSINGS) IMPLANT
PATTIES SURGICAL .5 X1 (DISPOSABLE) ×3 IMPLANT
SPONGE LAP 4X18 X RAY DECT (DISPOSABLE) ×2 IMPLANT
SPONGE SURGIFOAM ABS GEL 100 (HEMOSTASIS) ×2 IMPLANT
STAPLER VISISTAT 35W (STAPLE) IMPLANT
STRIP CLOSURE SKIN 1/2X4 (GAUZE/BANDAGES/DRESSINGS) IMPLANT
SURGIFLO W/THROMBIN 8M KIT (HEMOSTASIS) IMPLANT
SUT BONE WAX W31G (SUTURE) ×2 IMPLANT
SUT MON AB 3-0 SH 27 (SUTURE) ×2
SUT MON AB 3-0 SH27 (SUTURE) ×1 IMPLANT
SUT VIC AB 1 CT1 18XCR BRD 8 (SUTURE) ×1 IMPLANT
SUT VIC AB 1 CT1 27 (SUTURE) ×2
SUT VIC AB 1 CT1 27XBRD ANTBC (SUTURE) ×2 IMPLANT
SUT VIC AB 1 CT1 8-18 (SUTURE) ×4
SUT VIC AB 2-0 CT1 18 (SUTURE) ×4 IMPLANT
SUT VICRYL 0 UR6 27IN ABS (SUTURE) ×2 IMPLANT
SYR BULB IRRIGATION 50ML (SYRINGE) ×2 IMPLANT
SYR CONTROL 10ML LL (SYRINGE) ×2 IMPLANT
TOWEL OR 17X26 10 PK STRL BLUE (TOWEL DISPOSABLE) ×4 IMPLANT
TRAY FOLEY CATH 16FRSI W/METER (SET/KITS/TRAYS/PACK) ×1 IMPLANT
WATER STERILE IRR 1000ML POUR (IV SOLUTION) ×1 IMPLANT
YANKAUER SUCT BULB TIP NO VENT (SUCTIONS) ×2 IMPLANT

## 2015-06-05 NOTE — Brief Op Note (Signed)
06/05/2015  4:18 PM  PATIENT:  Briana [name]  81 y.o. female  PRE-OPERATIVE DIAGNOSIS:  LUMBAR SPINAL STENOSIS  POST-OPERATIVE DIAGNOSIS:  LUMBAR SPINAL STENOSIS  PROCEDURE:  Procedure(s): L3-L5 DECOMPRESSION  (N/A)  SURGEON:  Surgeon(s) and Role:    * Melina Schools, MD - Primary  PHYSICIAN ASSISTANT:   ASSISTANTS: none   ANESTHESIA:   IV sedation  EBL:  Total I/O In: 1700 [I.V.:1700] Out: 350 [Urine:150; Blood:200]  BLOOD ADMINISTERED:none  DRAINS: (1) Hemovact drain(s) in the back with  Suction Open   LOCAL MEDICATIONS USED:  MARCAINE     SPECIMEN:  No Specimen  DISPOSITION OF SPECIMEN:  N/A  COUNTS:  YES  TOURNIQUET:  * No tourniquets in log *  DICTATION: .Other Dictation: Dictation Number [id number]  PLAN OF CARE: Admit to inpatient   PATIENT DISPOSITION:  PACU - hemodynamically stable.

## 2015-06-05 NOTE — Transfer of Care (Signed)
Immediate Anesthesia Transfer of Care Note  Patient: Briana [name]  Procedure(s) Performed: Procedure(s): L3-L5 DECOMPRESSION  (N/A)  Patient Location: PACU  Anesthesia Type:General  Level of Consciousness: awake, oriented and patient cooperative  Airway & Oxygen Therapy: Patient Spontanous Breathing and Patient connected to face mask oxygen  Post-op Assessment: Report given to RN and Post -op Vital signs reviewed and stable  Post vital signs: Reviewed  Last Vitals:  Filed Vitals:   06/05/15 1614  BP:   Pulse:   Temp: 37 C  Resp:     Complications: No apparent anesthesia complications

## 2015-06-05 NOTE — Anesthesia Postprocedure Evaluation (Signed)
  Anesthesia Post-op Note  Patient: Briana [name]  Procedure(s) Performed: Procedure(s): L3-L5 DECOMPRESSION  (N/A)  Patient Location: PACU  Anesthesia Type:General  Level of Consciousness: awake, alert  and oriented  Airway and Oxygen Therapy: Patient Spontanous Breathing and Patient connected to nasal cannula oxygen  Post-op Pain: mild  Post-op Assessment: Post-op Vital signs reviewed, Patient's Cardiovascular Status Stable, Respiratory Function Stable, Patent Airway and Pain level controlled LLE Motor Response: Purposeful movement, Responds to commands LLE Sensation: No numbness RLE Motor Response: Purposeful movement, Responds to commands RLE Sensation: No numbness      Post-op Vital Signs: stable  Last Vitals:  Filed Vitals:   06/05/15 1707  BP: 145/70  Pulse: 52  Temp: 36.6 C  Resp: 16    Complications: No apparent anesthesia complications

## 2015-06-05 NOTE — Progress Notes (Signed)
Pt arrived to 4N7 from PACU. Pt alert and oriented x4. C/o of pain 6/6. SCDs in placed. Safety measures in place. Will continue to monitor.  Docia Barrier, Therapist, sports.

## 2015-06-05 NOTE — Anesthesia Procedure Notes (Signed)
Procedure Name: Intubation Date/Time: 06/05/2015 1:11 PM Performed by: Jenne Campus Pre-anesthesia Checklist: Patient identified, Emergency Drugs available, Suction available, Patient being monitored and Timeout performed Patient Re-evaluated:Patient Re-evaluated prior to inductionOxygen Delivery Method: Circle system utilized Preoxygenation: Pre-oxygenation with 100% oxygen Intubation Type: IV induction Ventilation: Mask ventilation without difficulty and Oral airway inserted - appropriate to patient size Laryngoscope Size: Miller and 2 Grade View: Grade I Tube type: Oral Tube size: 7.0 mm Number of attempts: 1 Airway Equipment and Method: Stylet Placement Confirmation: ETT inserted through vocal cords under direct vision,  positive ETCO2,  CO2 detector and breath sounds checked- equal and bilateral Secured at: 21 cm Tube secured with: Tape Dental Injury: Teeth and Oropharynx as per pre-operative assessment

## 2015-06-05 NOTE — H&P (Signed)
The patient is a 81 year old female who presents with back pain. The patient is here today for a surgical consult and in referral from Dr Nelva Bush. The patient reports low back symptoms including pain (right buttock), low back pain, spasms, tightness and numbness which began year(s) ago without any known injury. and Symptoms do not include numbness, tingling, incontinence of stool or incontinence of urine. The pain radiates to the right buttock and right posterior lower leg. The patient describes the severity of their symptoms as severe (7/10). The patient feels as if the symptoms are unchanging. Symptoms are exacerbated by standing. Current treatment includes nonsteroidal anti-inflammatory drugs (had Tramadol but upset her stomach), non-opioid analgesics (Tylenol) and epidural injections (03-15-15 did not help). Prior to being seen today the patient was previously evaluated in this clinic (Dr Nelva Bush). Past evaluation has included MRI of the lumbar spine. Past treatment has included non-opioid analgesics, opioid analgesics and epidural injections.  Allergies  PREDNISONE02/15/2011 high doses make her loopy, anxious  Family History  Osteoporosis Mother. Cerebrovascular Accident father Osteoarthritis mother Congestive Heart Failure mother  Social History  Children 3 Exercise does gym / weights Drug/Alcohol Rehab (Previously) no Tobacco use Never smoker. never smoker Alcohol use current drinker; drinks beer; only occasionally per week Illicit drug use no Living situation live alone Current work status retired Marital status widowed Pain Contract yes Drug/Alcohol Rehab (Currently) no Number of flights of stairs before winded greater than 5 Tobacco / smoke exposure no  Medication History  Ultram (50MG  Tablet, 1 (one) Oral three times daily, as needed, Taken starting 03/05/2015) Active. Propranolol HCl ER (80MG  Capsule ER 24HR, Oral) Active. Vitamin D (1000UNIT Capsule, Oral)  Active. Osteo Bi-Flex Adv Double St (Oral) Active. Tamoxifen Citrate (20MG  Tablet, Oral) Active. Super B Complex (Oral) Active. Aspirin Low Strength (81MG  Tablet Chewable, Oral) Active. Ranitidine HCl (300MG  Capsule, Oral two times daily) Active. Bumetanide (1MG  Tablet, 1/2 tab Oral two times daily) Active. Meloxicam (7.5MG  Tablet, Oral) Active. Vitamin C (Oral) Specific dose unknown - Active. Vitamin E Complex (1000UNIT Capsule, Oral) Active. Medications Reconciled  Past Surgical History  Breast Biopsy right Thyroidectomy; Subtotal Cesarean Delivery 1 time Colon Polyp Removal - Colonoscopy Total Hip Replacement left  Other Problems  Irritable bowel syndrome Osteoarthritis Osteoporosis Skin Cancer Diabetes Mellitus, Type I Diverticulitis Of Colon Gastroesophageal Reflux Disease High blood pressure Hypercholesterolemia  Objective Transcription She is apleasant woman, appears younger than her stated age. She is alert, she is oriented x3. She is having significant back, buttock, and bilateral leg pain, right side more severe than the left. She has significant pain with extension of the spine, relieved with forward flexion. No loss in bowel or bladder control. No shortness of breath or chest pain. Compartments are soft and nontender. She is grossly neurologically intact. She does ambulate with the assistance of a cane and always stands in a forward flexed position. She has no real hip, knee or ankle pain. She is five years status post left total hip replacement.  IMAGING MRI clearly showed severe spinal stenosis at L4-5 with moderate-to-severe stenosis at L3-4. There is some mild stenosis at L2-3, multilevel degenerative disc changes. No significant neural compression or foraminal disease at L5-S1.  Assessments Transcription At this point in time, the patient's clinical symptoms are consistent with spinal stenosis most noted at L4-5.    Plans  Transcription I had a long discussion with the patient and her two daughters. All of their questions were encouraged and addressed. Over the last two  months, she has gotten significantly worse. She has had an epidural injection which in the past would be helpful, but it is no longer helpful. She cannot continue to do herself directed water therapy or exercise over these last two months because of this pain. She now is more apt to just remain in a recliner than to be as active as she was. I think the treatment of choice at this point would be a lumbar decompression, this would be an L4 laminectomy, L3 laminotomy and essentially an L3 to L5 lumbar decompression. I have gone over the risks with her and her family, these include infection, bleeding, nerve damage, death, stroke, paralysis, failure to heal, need for further surgery, ongoing or worse pain, recurrent spinal stenosis, leak of spinal fluid and need for further surgery. All of their questions were encouraged and addressed. I have asked them to think about their options and they will contact me and let me know how they would like to proceed. If they decide to proceed with surgery, I have already given a clearance form which they will bring to Dr. Eddie North for preoperative medical clearance.  Goal Of Surgery:Discussed that goal of surgery is to reduce pain and improve function and quality of life. Patient is aware that despite all appropriate treatment that there pain and function could be the same, worse, or different. Posterior Lumbar Decompression/disectomy: Risks of surgery include infection, bleeding, nerve damage, death, stroke, paralysis, failure to heal, need for further surgery, ongoing or worse pain, need for further surgery, CSF leak, loss of bowel or bladder, and recurrent disc herniation or Stenosis which would necessitate need for further surgery

## 2015-06-06 ENCOUNTER — Encounter (HOSPITAL_COMMUNITY): Payer: Self-pay | Admitting: General Practice

## 2015-06-06 MED ORDER — FAMOTIDINE 20 MG PO TABS
20.0000 mg | ORAL_TABLET | Freq: Two times a day (BID) | ORAL | Status: DC
  Administered 2015-06-06 – 2015-06-07 (×3): 20 mg via ORAL
  Filled 2015-06-06 (×3): qty 1

## 2015-06-06 NOTE — Care Management Note (Signed)
Case Management Note  Patient Details  Name: Briana [name] MRN: [id number] Date of Birth: 1934/03/03  Subjective/Objective:                    Action/Plan: Patient lives at home alone. Plan is for discharge to skilled nursing facility, potentially 06/07/15 Expected Discharge Date:                  Expected Discharge Plan:  [place] (Patient lives at home alone)  In-House Referral:  Clinical Social Work  Discharge planning Services  CM Consult  Post Acute Care Choice:    Choice offered to:     DME Arranged:    DME Agency:     HH Arranged:    [place] Agency:     Status of Service:  In process, will continue to follow  Medicare Important Message Given:    Date Medicare IM Given:    Medicare IM give by:    Date Additional Medicare IM Given:    Additional Medicare Important Message give by:     If discussed at [place] of Stay Meetings, dates discussed:    Additional Comments:  Rolm Baptise, RN 06/06/2015, 2:28 PM

## 2015-06-06 NOTE — Evaluation (Signed)
Occupational Therapy Evaluation Patient Details Name: Briana [name] MRN: [id number] DOB: 08-26-1934 Today's Date: 06/06/2015    History of Present Illness 81 y.o. female s/p L3-L5 decompression. PMH: osteoarthritis, osteoporosis, diabetes   Clinical Impression   Patient is s/p L3-L5 decompression surgery resulting in functional limitations due to the deficits listed below (see OT problem list). Education and handout provided on back precautions. Pt required VC's to maintain precautions during ADLs. Pt lives alone, but reports daughters are available to assist upon d/c. Patient will benefit from skilled OT acutely to increase independence and safety with ADLS prior to d/c home with [place].     Follow Up Recommendations  Home health OT;Supervision - Intermittent    Equipment Recommendations  None recommended by OT    Recommendations for Other Services       Precautions / Restrictions Precautions Precautions: Back Precaution Booklet Issued: Yes (comment) Precaution Comments: handout provided Required Braces or Orthoses: Spinal Brace Spinal Brace: Lumbar corset;Applied in sitting position Restrictions Weight Bearing Restrictions: No      Mobility Bed Mobility Overal bed mobility: Modified Independent             General bed mobility comments: VC's for technique  Transfers Overall transfer level: Needs assistance Equipment used: Rolling walker (2 wheeled) Transfers: Sit to/from Stand Sit to Stand: Min guard              Balance Overall balance assessment: Needs assistance Sitting-balance support: Feet supported;No upper extremity supported Sitting balance-Leahy Scale: Good     Standing balance support: Single extremity supported;During functional activity Standing balance-Leahy Scale: Good                              ADL Overall ADL's : Needs assistance/impaired Eating/Feeding: Independent;Sitting   Grooming: Wash/dry hands;Min  guard;Standing   Upper Body Bathing: Minimal assitance;Sitting   Lower Body Bathing: Minimal assistance;Sit to/from stand   Upper Body Dressing : Minimal assistance;Sitting (don brace)   Lower Body Dressing: Minimal assistance;Sit to/from stand;Cueing for back precautions (don socks)   Toilet Transfer: Min guard;Ambulation;Regular Toilet;RW   Toileting- Clothing Manipulation and Hygiene: Modified independent;Sit to/from stand   Tub/ Shower Transfer: Minimal assistance;Ambulation;Rolling walker;Shower seat   Functional mobility during ADLs: Min guard;Rolling walker General ADL Comments: VCs to maintain precautions     Vision Vision Assessment?: No apparent visual deficits   Perception     Praxis      Pertinent Vitals/Pain Pain Assessment: 0-10 Pain Score: 4  Pain Location: back Pain Descriptors / Indicators: Sore Pain Intervention(s): Monitored during session;Limited activity within patient's tolerance;Premedicated before session;Repositioned;Relaxation     Hand Dominance Right   Extremity/Trunk Assessment Upper Extremity Assessment Upper Extremity Assessment: Overall WFL for tasks assessed   Lower Extremity Assessment Lower Extremity Assessment: Overall WFL for tasks assessed       Communication Communication Communication: No difficulties   Cognition Arousal/Alertness: Awake/alert Behavior During Therapy: WFL for tasks assessed/performed Overall Cognitive Status: Within Functional Limits for tasks assessed                     General Comments       Exercises       Shoulder Instructions      Home Living Family/patient expects to be discharged to:: Private residence Living Arrangements: Alone Available Help at Discharge: Family;Available PRN/intermittently Type of Home: House Home Access: Stairs to enter [employer] of Steps: 2 Entrance Stairs-Rails: Right Home  Layout: One level     Bathroom Shower/Tub: Tub/shower  unit;Curtain Shower/tub characteristics: Curtain       Home Equipment: Environmental consultant - 2 wheels;Cane - single point;Shower seat          Prior Functioning/Environment Level of Independence: Independent             OT Diagnosis: Generalized weakness;Acute pain   OT Problem List: Decreased strength;Decreased activity tolerance;Impaired balance (sitting and/or standing);Decreased knowledge of use of DME or AE;Decreased knowledge of precautions;Pain   OT Treatment/Interventions: Self-care/ADL training;Therapeutic exercise;DME and/or AE instruction;Therapeutic activities;Patient/family education;Balance training    OT Goals(Current goals can be found in the care plan section) Acute Rehab OT Goals Patient Stated Goal: to go home OT Goal Formulation: With patient Time For Goal Achievement: 06/20/15 Potential to Achieve Goals: Good ADL Goals Pt Will Perform Grooming: with supervision;standing Pt Will Perform Upper Body Dressing: with supervision;sitting Pt Will Perform Lower Body Dressing: with supervision;sit to/from stand Pt Will Transfer to Toilet: with supervision;ambulating;regular height toilet Pt Will Perform Tub/Shower Transfer: Tub transfer;with min guard assist;ambulating;shower seat;rolling walker Additional ADL Goal #1: Pt will verbalize 3/3 precautions as precursor to ADLs.  OT Frequency: Min 2X/week   Barriers to D/C:            Co-evaluation              End of Session Equipment Utilized During Treatment: Gait belt;Rolling walker;Back brace Nurse Communication: Mobility status;Precautions  Activity Tolerance: Patient tolerated treatment well;No increased pain Patient left: in chair;with call bell/phone within reach;with chair alarm set   Time: [phone] OT Time Calculation (min): 23 min Charges:  OT General Charges $OT Visit: 1 Procedure OT Evaluation $Initial OT Evaluation Tier I: 1 Procedure OT Treatments $Self Care/Home Management : 8-22  mins G-Codes:    Forest Gleason 06/06/2015, 9:45 AM

## 2015-06-06 NOTE — Progress Notes (Signed)
    Subjective: Procedure(s) (LRB): L3-L5 DECOMPRESSION  (N/A) 1 Day Post-Op  Patient reports pain as 2 on 0-10 scale.  Reports none leg pain reports incisional back pain   Positive void Negative bowel movement Positive flatus Negative chest pain or shortness of breath  Objective: Vital signs in last 24 hours: Temp:  [97 F (36.1 C)-98.4 F (36.9 C)] 98.4 F (36.9 C) (07/21 1352) Pulse Rate:  [52-65] 57 (07/21 1352) Resp:  [12-18] 18 (07/21 1352) BP: (117-149)/(41-70) 149/56 mmHg (07/21 1352) SpO2:  [94 %-97 %] 97 % (07/21 1352)  Intake/Output from previous day: 07/20 0701 - 07/21 0700 In: 1700 [I.V.:1700] Out: 2120 [Urine:1650; Drains:270; Blood:200]  Labs: No results for input(s): WBC, RBC, HCT, PLT in the last 72 hours. No results for input(s): NA, K, CL, CO2, BUN, CREATININE, GLUCOSE, CALCIUM in the last 72 hours. No results for input(s): LABPT, INR in the last 72 hours.  Physical Exam: Neurologically intact ABD soft Sensation intact distally Intact pulses distally Incision: dressing C/D/I Compartment soft  Assessment/Plan: Patient stable  xrays n/a Continue mobilization with physical therapy Continue care  Advance diet Up with therapy  Plan on drain removal tomorrow Doing well - voiding spontaneously Pain well controlled Ambulating without pain  Melina Schools, MD [place] [phone]

## 2015-06-06 NOTE — Evaluation (Signed)
Physical Therapy Evaluation Patient Details Name: Briana [name] MRN: [id number] DOB: 16-Sep-1934 Today's Date: 06/06/2015   History of Present Illness  81 y.o. female s/p L3-L5 decompression. PMH: osteoarthritis, osteoporosis, diabetes  Clinical Impression  Patient demonstrates deficits in functional mobility as indicated below. Will need continued skilled PT to address deficits and maximize function. Will see as indicated and progress as tolerated. OF NOTE: Patient resides alone, during session states that she will be going to [facility] tomorrow.     Follow Up Recommendations SNF;Supervision/Assistance - 24 hour     Equipment Recommendations  None recommended by PT    Recommendations for Other Services       Precautions / Restrictions Precautions Precautions: Back Precaution Booklet Issued: Yes (comment) Precaution Comments: handout provided Required Braces or Orthoses: Spinal Brace Spinal Brace: Lumbar corset;Applied in sitting position Restrictions Weight Bearing Restrictions: No      Mobility  Bed Mobility Overal bed mobility: Modified Independent             General bed mobility comments: VC's for technique  Transfers Overall transfer level: Needs assistance Equipment used: Rolling walker (2 wheeled) Transfers: Sit to/from Stand Sit to Stand: Min guard            Ambulation/Gait Ambulation/Gait assistance: Supervision;Min guard Ambulation Distance (Feet): 140 Feet Assistive device: Rolling walker (2 wheeled) Gait Pattern/deviations: Step-through pattern;Decreased stride length Gait velocity: decreased Gait velocity interpretation: Below normal speed for age/gender General Gait Details: stead with gait, VCs for cadence  Stairs            Wheelchair Mobility    Modified Rankin (Stroke Patients Only)       Balance   Sitting-balance support: Feet supported Sitting balance-Leahy Scale: Good     Standing balance support: Bilateral upper  extremity supported Standing balance-Leahy Scale: Good                               Pertinent Vitals/Pain Pain Assessment: 0-10 Pain Score: 3  Pain Location: back Pain Descriptors / Indicators: Sore Pain Intervention(s): Monitored during session;Limited activity within patient's tolerance;Premedicated before session;Repositioned;Relaxation    Home Living Family/patient expects to be discharged to:: Private residence Living Arrangements: Alone Available Help at Discharge: Family;Available PRN/intermittently Type of Home: House Home Access: Stairs to enter Entrance Stairs-Rails: Right Entrance Stairs-Number of Steps: 2 Home Layout: One level Home Equipment: Walker - 2 wheels;Cane - single point;Shower seat      Prior Function Level of Independence: Independent               Hand Dominance   Dominant Hand: Right    Extremity/Trunk Assessment   Upper Extremity Assessment: Overall WFL for tasks assessed           Lower Extremity Assessment: Overall WFL for tasks assessed         Communication   Communication: No difficulties  Cognition Arousal/Alertness: Awake/alert Behavior During Therapy: WFL for tasks assessed/performed Overall Cognitive Status: Within Functional Limits for tasks assessed                      General Comments General comments (skin integrity, edema, etc.): able to recall 2/3 precuations    Exercises        Assessment/Plan    PT Assessment Patient needs continued PT services  PT Diagnosis Difficulty walking;Acute pain   PT Problem List Decreased strength;Decreased range of motion;Decreased activity tolerance;Decreased balance;Decreased mobility;Decreased coordination;Decreased  cognition;Decreased safety awareness;Decreased knowledge of precautions;Pain  PT Treatment Interventions DME instruction;Gait training;Functional mobility training;Therapeutic activities;Therapeutic exercise;Balance  training;Patient/family education   PT Goals (Current goals can be found in the Care Plan section)      Frequency Min 5X/week   Barriers to discharge Decreased caregiver support      Co-evaluation               End of Session Equipment Utilized During Treatment: Gait belt;Back brace Activity Tolerance: Patient tolerated treatment well Patient left: in chair;with call bell/phone within reach;with chair alarm set;with family/visitor present Nurse Communication: Mobility status         Time: [id number] PT Time Calculation (min) (ACUTE ONLY): 18 min   Charges:   PT Evaluation $Initial PT Evaluation Tier I: 1 Procedure     PT G CodesDuncan Dull Jun 08, 2015, 1:03 PM Alben Deeds, [place] DPT  [phone]

## 2015-06-06 NOTE — Op Note (Signed)
NAMEINFANT, COTLER NO.:  [id number]  MEDICAL RECORD NO.:  [id number]  LOCATION:  [id number]                        FACILITY:  [facility]  PHYSICIAN:  Dahari D. Rolena Infante, M.D. DATE OF BIRTH:  [birth year]  DATE OF PROCEDURE:  06/05/2015 DATE OF DISCHARGE:                              OPERATIVE REPORT   She is a very pleasant 81 year old woman who has a history of severe spinal stenosis with neurogenic claudication.  Attempts at conservative management have failed to alleviate her symptoms and so we elected to proceed with surgery.  PREOPERATIVE DIAGNOSIS:  L3-4 and L4-5 spinal stenosis.  POSTOPERATIVE DIAGNOSIS:  L3-4 and L4-5 spinal stenosis.  OPERATIVE PROCEDURE:  L3-5 lumbar decompression.  COMPLICATIONS:  None.  CONDITION:  Stable.  OPERATIVE NOTE:  The patient was brought to the operating room and placed supine on the operating table.  After successful induction of general anesthesia and endotracheal intubation, TEDs, SCDs, and Foley were placed.  She was turned prone onto the Wilson frame and all bony prominences were well padded.  After successful time-out confirming the patient, procedure, and all other pertinent important data, a midline incision was made, centered over the L3-4 and L4-5 level.  Sharp dissection was carried out down to the deep fascia.  Deep fascia was sharply incised and I stripped the paraspinal muscles using Bovie and Cobb elevator to expose the posterior elements at L3-4 and L4-5 and the L5 spinous process.  A Penfield 4 was placed underneath the L3 lamina and x-ray was taken.  Once I confirmed that I was at the appropriate level, I removed the entire L4 spinous process with double-action Leksell rongeur and the majority of the L3.  A curette was used to create a space in the ligamentum flavum and the lamina of L4.  There was significant arthritic changes, facets degeneration and buckling of the ligamentum flavum.  I then used a 2 and  3-mm Kerrison rongeur to perform a generous laminotomy of L4.  I then developed a plane using a Penfield 4 between the ligamentum flavum and the dura.  I then removed the central portion of the ligamentum flavum.  There was significant compression to the thecal sac.  With great care, I proceeded into the lateral recess creating a space and removing ligamentum flavum and osteophyte.  Once I had the lateral recess decompressed bilaterally, I then performed an L3 laminotomy using a similar technique.  At this point, I now had the thecal sac exposed centrally above and below the remaining portion of L4.  I passed a neural patty underneath the root L4 lamina and then completed my laminectomy of L4.  At this point, I had a partial laminotomy of L3, a complete laminectomy of L4 and the central decompression was complete.  I then continued to work in the lateral recess using 2 and 3-mm Kerrison rongeurs to remove ligamentum flavum and osteophyte.  A foraminotomy was also performed.  At this point, I then took another x-ray confirming that I was spanning from the L3 pedicle down to the L5 pedicle.  I could freely use my [facility] in the lateral recess at each neural foramen and there was  no significant lateral recess or foraminal stenosis.  Pleased with the decompression, I irrigated the wound copiously with normal saline and then obtained hemostasis using bipolar electrocautery and FloSeal.  I irrigated again, placed a deep drain and a thrombin-soaked Gelfoam patty over the exposed thecal sac.  I also put some antibiotic beads in.  I then closed the deep fascia with interrupted #1 Vicryl sutures, placed some more antibiotic beads and then closed superficial with 2-0 Vicryl sutures and a 3-0 Monocryl for the skin.  Steri-Strips and the dry dressing were applied.  The patient was ultimately extubated and transferred to the PACU without incident.  At the end of the case, all needle and sponge  counts were correct.     Dahari D. Rolena Infante, M.D.     DDB/MEDQ  D:  06/05/2015  T:  06/06/2015  Job:  [id number]

## 2015-06-07 DIAGNOSIS — K5901 Slow transit constipation: Secondary | ICD-10-CM | POA: Diagnosis not present

## 2015-06-07 DIAGNOSIS — G250 Essential tremor: Secondary | ICD-10-CM | POA: Diagnosis not present

## 2015-06-07 DIAGNOSIS — M961 Postlaminectomy syndrome, not elsewhere classified: Secondary | ICD-10-CM | POA: Diagnosis not present

## 2015-06-07 DIAGNOSIS — N6019 Diffuse cystic mastopathy of unspecified breast: Secondary | ICD-10-CM | POA: Diagnosis not present

## 2015-06-07 DIAGNOSIS — E119 Type 2 diabetes mellitus without complications: Secondary | ICD-10-CM | POA: Diagnosis not present

## 2015-06-07 DIAGNOSIS — M810 Age-related osteoporosis without current pathological fracture: Secondary | ICD-10-CM | POA: Diagnosis not present

## 2015-06-07 DIAGNOSIS — E785 Hyperlipidemia, unspecified: Secondary | ICD-10-CM | POA: Diagnosis not present

## 2015-06-07 DIAGNOSIS — Z981 Arthrodesis status: Secondary | ICD-10-CM | POA: Diagnosis not present

## 2015-06-07 DIAGNOSIS — R278 Other lack of coordination: Secondary | ICD-10-CM | POA: Diagnosis not present

## 2015-06-07 DIAGNOSIS — K210 Gastro-esophageal reflux disease with esophagitis: Secondary | ICD-10-CM | POA: Diagnosis not present

## 2015-06-07 DIAGNOSIS — R2681 Unsteadiness on feet: Secondary | ICD-10-CM | POA: Diagnosis not present

## 2015-06-07 DIAGNOSIS — M4806 Spinal stenosis, lumbar region: Secondary | ICD-10-CM | POA: Diagnosis not present

## 2015-06-07 DIAGNOSIS — M6281 Muscle weakness (generalized): Secondary | ICD-10-CM | POA: Diagnosis not present

## 2015-06-07 DIAGNOSIS — M5135 Other intervertebral disc degeneration, thoracolumbar region: Secondary | ICD-10-CM | POA: Diagnosis not present

## 2015-06-07 DIAGNOSIS — R600 Localized edema: Secondary | ICD-10-CM | POA: Diagnosis not present

## 2015-06-07 MED ORDER — ONDANSETRON HCL 4 MG PO TABS: 4.0000 mg | ORAL_TABLET | Freq: Three times a day (TID) | ORAL | Status: DC | PRN

## 2015-06-07 MED ORDER — HYDROCODONE-ACETAMINOPHEN 5-325 MG PO TABS: 1.0000 | ORAL_TABLET | Freq: Four times a day (QID) | ORAL | Status: DC | PRN

## 2015-06-07 MED ORDER — METHOCARBAMOL 500 MG PO TABS: 500.0000 mg | ORAL_TABLET | Freq: Three times a day (TID) | ORAL | Status: DC | PRN

## 2015-06-07 NOTE — Clinical Social Work Placement (Signed)
   CLINICAL SOCIAL WORK PLACEMENT  NOTE  Date:  06/07/2015  Patient Details  Name: Briana [name] MRN: [id number] Date of Birth: May 19, 1934  Clinical Social Work is seeking post-discharge placement for this patient at the [place] level of care (*CSW will initial, date and re-position this form in  chart as items are completed):  Yes   Patient/family provided with [facility] Work Department's list of facilities offering this level of care within the geographic area requested by the patient (or if unable, by the patient's family).  Yes   Patient/family informed of their freedom to choose among providers that offer the needed level of care, that participate in Medicare, Medicaid or managed care program needed by the patient, have an available bed and are willing to accept the patient.  Yes   Patient/family informed of [facility]'s ownership interest in [facility] and [facility], as well as of the fact that they are under no obligation to receive care at these facilities.  PASRR submitted to EDS on 06/07/15     PASRR number received on 06/07/15     Existing PASRR number confirmed on       FL2 transmitted to all facilities in geographic area requested by pt/family on 06/07/15     FL2 transmitted to all facilities within larger geographic area on       Patient informed that his/her managed care company has contracts with or will negotiate with certain facilities, including the following:   (YES )     Yes   Patient/family informed of bed offers received.  Patient chooses bed at  ([place] )     Physician recommends and patient chooses bed at      Patient to be transferred to  ([facility] ) on 06/07/15.  Patient to be transferred to facility by  Corey Harold )     Patient family notified on 06/07/15 of transfer.  Name of family member notified:   (Pt's dtrs, [name] and [name] )     [place] Please sign  FL2, Please prepare prescriptions     Additional Comment:    _______________________________________________ Glendon Axe, MSW, LCSWA [phone] 06/07/2015 11:33 AM

## 2015-06-07 NOTE — Progress Notes (Signed)
    Subjective: Procedure(s) (LRB): L3-L5 DECOMPRESSION  (N/A) 2 Days Post-Op  Patient reports pain as 3 on 0-10 scale.  Reports none leg pain reports incisional back pain   Positive void Negative bowel movement Positive flatus Negative chest pain or shortness of breath  Objective: Vital signs in last 24 hours: Temp:  [98.2 F (36.8 C)-100.9 F (38.3 C)] 99.1 F (37.3 C) (07/22 0606) Pulse Rate:  [57-76] 76 (07/22 0606) Resp:  [16-19] 16 (07/22 0606) BP: (117-149)/(41-80) 144/80 mmHg (07/22 0606) SpO2:  [93 %-100 %] 94 % (07/22 0606)  Intake/Output from previous day: 07/21 0701 - 07/22 0700 In: -  Out: 45 [Drains:45]  Labs: No results for input(s): WBC, RBC, HCT, PLT in the last 72 hours. No results for input(s): NA, K, CL, CO2, BUN, CREATININE, GLUCOSE, CALCIUM in the last 72 hours. No results for input(s): LABPT, INR in the last 72 hours.  Physical Exam: Neurologically intact Neurovascular intact Sensation intact distally Incision: dressing C/D/I Compartment soft  Assessment/Plan: Patient stable  xrays n/a Continue mobilization with physical therapy Continue care  Advance diet Up with therapy Discharge to SNF when bed is ready  Melina Schools, MD [place] [phone]

## 2015-06-07 NOTE — Progress Notes (Signed)
Physical Therapy Treatment Patient Details Name: Briana [name] MRN: [id number] DOB: 02/05/1934 Today's Date: 06/07/2015    History of Present Illness 81 y.o. female s/p L3-L5 decompression. PMH: osteoarthritis, osteoporosis, diabetes    PT Comments    Pt is progressing towards goals. Pt showed increased ambulation tolerance today. Challenged Pt by transitioning from a RW to [facility] x1 when ambulating, still requires assistance to ambulate safely. Pt was able to recall back precautions. Will continue to follow and progress towards goals.  Follow Up Recommendations  SNF;Supervision/Assistance - 24 hour     Equipment Recommendations  None recommended by PT    Recommendations for Other Services       Precautions / Restrictions Precautions Precautions: Back Precaution Booklet Issued: Yes (comment) Precaution Comments: handout reviewed Required Braces or Orthoses: Spinal Brace Spinal Brace: Lumbar corset;Applied in sitting position Restrictions Weight Bearing Restrictions: No    Mobility  Bed Mobility               General bed mobility comments: Pt was sitting on EOB upon therapist entering room  Transfers Overall transfer level: Needs assistance Equipment used: Rolling walker (2 wheeled) Transfers: Sit to/from Stand Sit to Stand: Min assist         General transfer comment: Min A for boost from recliner, increased time to complete task and cues for hand placement for proper sit to stand technique.  Ambulation/Gait Ambulation/Gait assistance: Min assist Ambulation Distance (Feet): 370 Feet Assistive device: Rolling walker (2 wheeled);1 person hand held assist Gait Pattern/deviations: Step-through pattern;Antalgic;Trunk flexed Gait velocity: slow Gait velocity interpretation: Below normal speed for age/gender General Gait Details: transitioned from RW to HHA to challenge ambulation. while using RW Pt required VC to stay close proximity to RW and to stand up straight  when ambulating. Without AD, pt reaching for rail, appeared less confident, but mechanics improved with distance and cues. Supervision for safety with RW. Min assist for balance with hand held support and no assistive device.   Stairs            Wheelchair Mobility    Modified Rankin (Stroke Patients Only)       Balance Overall balance assessment: Needs assistance Sitting-balance support: Feet supported;No upper extremity supported Sitting balance-Leahy Scale: Good     Standing balance support: Single extremity supported;Bilateral upper extremity supported;During functional activity Standing balance-Leahy Scale: Good Standing balance comment: Pt at start of session used a RW however therapist transitioned to HHA x1 . Pt balance was challenged  and cues were given to maintain up right posture while ambulating.                    Cognition Arousal/Alertness: Awake/alert Behavior During Therapy: WFL for tasks assessed/performed Overall Cognitive Status: Within Functional Limits for tasks assessed                      Exercises      General Comments General comments (skin integrity, edema, etc.): able to recall all back precautions      Pertinent Vitals/Pain Pain Assessment: 0-10 Pain Score: 7  Pain Location: back at surgical site Pain Descriptors / Indicators: Heaviness;Sore Pain Intervention(s): Monitored during session    Home Living                      Prior Function            PT Goals (current goals can now be found in the care  plan section) Acute Rehab PT Goals Patient Stated Goal: No stated goals Progress towards PT goals: Progressing toward goals    Frequency  Min 5X/week    PT Plan Current plan remains appropriate    Co-evaluation             End of Session Equipment Utilized During Treatment: Back brace Activity Tolerance: Patient tolerated treatment well Patient left: in chair;with call bell/phone within  reach;with chair alarm set     Time: 1446-1459 PT Time Calculation (min) (ACUTE ONLY): 13 min  Charges:  $Gait Training: 8-22 mins                    G Codes:      [name] 06-28-2015, 5:21 PM

## 2015-06-07 NOTE — Clinical Social Work Note (Signed)
Clinical Social Worker facilitated patient discharge including contacting patient family and facility to confirm patient discharge plans.  Clinical information faxed to facility and family agreeable with plan.  CSW arranged ambulance transport via [facility] to [facility].  RN to call report prior to discharge.  DC packet prepared and on chart for transport with number for report.   Clinical Social Worker will sign off for now as social work intervention is no longer needed. Please consult [place] again if new need arises.  Glendon Axe, MSW, LCSWA [phone] 06/07/2015 11:34 AM

## 2015-06-07 NOTE — Discharge Summary (Signed)
Patient ID: Briana [name] MRN: [id number] DOB/AGE: 79-04-1934 65 y.o.  Admit date: 06/05/2015 Discharge date: 06/07/2015  Admission Diagnoses:  Active Problems:   Lumbar stenosis   Discharge Diagnoses:  Active Problems:   Lumbar stenosis  status post Procedure(s): L3-L5 DECOMPRESSION   Past Medical History  Diagnosis Date  . Diverticulitis   . Hyperlipidemia   . Pre-diabetes   . Vitamin D deficiency   . Osteoporosis   . Varicose veins   . Hx of irritable bowel syndrome   . Occasional tremors   . Heart murmur   . Diabetes mellitus without complication     "pre-diabetes"  . History of pneumonia   . GERD (gastroesophageal reflux disease)     takes ranitidine  . DJD (degenerative joint disease)   . DDD (degenerative disc disease)   . Cancer     skin cancer  . History of hiatal hernia     Surgeries: Procedure(s): L3-L5 DECOMPRESSION  on 06/05/2015   Consultants:    Discharged Condition: Improved  Hospital Course: CIA SHARF is an 81 y.o. female who was admitted 06/05/2015 for operative treatment of spinal stenosis. Patient failed conservative treatments (please see the history and physical for the specifics) and had severe unremitting pain that affects sleep, daily activities and work/hobbies. After pre-op clearance, the patient was taken to the operating room on 06/05/2015 and underwent  Procedure(s): L3-L5 DECOMPRESSION .    Patient was given perioperative antibiotics: Anti-infectives    Start     Dose/Rate Route Frequency Ordered Stop   06/05/15 2100  ceFAZolin (ANCEF) IVPB 1 g/50 mL premix     1 g 100 mL/hr over 30 Minutes Intravenous Every 8 hours 06/05/15 1716 06/06/15 0602   06/05/15 1547  vancomycin (VANCOCIN) powder  Status:  Discontinued       As needed 06/05/15 1547 06/05/15 1612   06/05/15 1415  ceFAZolin (ANCEF) IVPB 2 g/50 mL premix     2 g 100 mL/hr over 30 Minutes Intravenous  Once 06/05/15 1409 06/05/15 1315       Patient was given  sequential compression devices and early ambulation to prevent DVT.   Patient benefited maximally from hospital stay and there were no complications. At the time of discharge, the patient was urinating/moving their bowels without difficulty, tolerating a regular diet, pain is controlled with oral pain medications and they have been cleared by PT/OT.   Recent vital signs: Patient Vitals for the past 24 hrs:  BP Temp Temp src Pulse Resp SpO2  06/07/15 0606 (!) 144/80 mmHg 99.1 F (37.3 C) Oral 76 16 94 %  06/07/15 0112 137/61 mmHg (!) 100.5 F (38.1 C) Oral 74 18 93 %  06/06/15 2208 (!) 124/56 mmHg (!) 100.9 F (38.3 C) Oral 76 19 93 %  06/06/15 1726 (!) 132/54 mmHg 98.2 F (36.8 C) Oral 70 18 100 %  06/06/15 1352 (!) 149/56 mmHg 98.4 F (36.9 C) Oral (!) 57 18 97 %  06/06/15 0952 (!) 117/41 mmHg 98.2 F (36.8 C) Oral 65 18 95 %     Recent laboratory studies: No results for input(s): WBC, HGB, HCT, PLT, NA, K, CL, CO2, BUN, CREATININE, GLUCOSE, INR, CALCIUM in the last 72 hours.  Invalid input(s): PT, 2   Discharge Medications:     Medication List    STOP taking these medications        aspirin 81 MG tablet      TAKE these medications  acetaminophen 650 MG CR tablet  Commonly known as:  TYLENOL  Take 650 mg by mouth every 8 (eight) hours as needed for pain.     albuterol 108 (90 BASE) MCG/ACT inhaler  Commonly known as:  PROVENTIL HFA;VENTOLIN HFA  Inhale 2 puffs into the lungs every 6 (six) hours as needed for wheezing or shortness of breath.     b complex vitamins tablet  Take 1 tablet by mouth daily.     bumetanide 1 MG tablet  Commonly known as:  BUMEX  TAKE ONE-HALF TO ONE TABLET BY MOUTH TWICE DAILY AS NEEDED FOR SWELLING     CALCIUM 600 PO  Take 600 mg by mouth every evening.     HYDROcodone-acetaminophen 5-325 MG per tablet  Commonly known as:  NORCO  Take 1-2 tablets by mouth every 6 (six) hours as needed for moderate pain.     Magnesium 250 MG  Tabs  Take 250 mg by mouth every evening.     methocarbamol 500 MG tablet  Commonly known as:  ROBAXIN  Take 1 tablet (500 mg total) by mouth 3 (three) times daily as needed for muscle spasms.     ondansetron 4 MG tablet  Commonly known as:  ZOFRAN  Take 1 tablet (4 mg total) by mouth every 8 (eight) hours as needed for nausea or vomiting.     OSTEO BI-FLEX JOINT SHIELD PO  Take 1 tablet by mouth 2 (two) times daily.     OVER THE COUNTER MEDICATION  Apply 1 application topically as needed (pain). OTC roll-on pain med     propranolol ER 80 MG 24 hr capsule  Commonly known as:  INDERAL LA  TAKE ONE CAPSULE BY MOUTH ONCE DAILY FOR  TREMORS.     ranitidine 300 MG tablet  Commonly known as:  ZANTAC  TAKE ONE TABLET BY MOUTH TWICE DAILY FOR  ACID  REFLUX     SALONPAS EX  Apply 1 application topically as needed.     tamoxifen 20 MG tablet  Commonly known as:  NOLVADEX  Take 1 tablet (20 mg total) by mouth daily.     Vitamin D3 2000 UNITS Tabs  Take 2,000-4,000 Units by mouth 2 (two) times daily. Take 4000 units in the morning and 2000 units in the evening     vitamin E 400 UNIT capsule  Take 400 Units by mouth 2 (two) times daily.        Diagnostic Studies: Dg Lumbar Spine 2-3 Views  06/05/2015   CLINICAL DATA:  Spinal stenosis.  EXAM: LUMBAR SPINE - 2-3 VIEW  COMPARISON:  Radiographs dated 01/06/2014  FINDINGS: Portable lateral view at 1:34 p.m. demonstrates needles at the level of L4 and at L1-2. Portable view at 1:49 p.m. demonstrates instruments at the L3-4 level and at the level of the pedicle of L4.  IMPRESSION: Instruments at L3-4.   Electronically Signed   By: Lorriane Shire M.D.   On: 06/05/2015 15:23   Dg Spine Portable 1 View  06/05/2015   CLINICAL DATA:  L3-4 decompression  EXAM: PORTABLE SPINE - 1 VIEW  COMPARISON:  None.  FINDINGS: A metallic probe tip is posterior to the inferior aspect of the L3 vertebral body. A second probe tip is posterior to the inferior  aspect of the L5 vertebral body. The metallic cutting device overlies the L5 spinous process. No fracture or spondylolisthesis.  IMPRESSION: Probe tips are posterior to the inferior aspect of L3 in the inferior aspect of L5  with the cutting device overlying the L5 spinous process.   Electronically Signed   By: Lowella Grip III M.D.   On: 06/05/2015 15:06          Follow-up Information    Follow up with Dahlia Bailiff, MD. Schedule an appointment as soon as possible for a visit in 2 weeks.   Specialty:  Orthopedic Surgery   Why:  For suture removal, For wound re-check   Contact information:   [address] [place] [address] [phone]       Discharge Plan:  discharge to [facility]  Disposition: hospital course uneventful.  Tolerated surgery - no complications.  Drain output decreased and was removed on POD#2.  Dressing changed on POD#2.  Will f/u in 2 weeks for wound check.   Norco 1-2 tabs every 6 hrs as needed for pain control Robaxin 1 every 8 hrs as needed for muscle spasms Zofran for nausea Ambulate as much as possible No bending, twisting, or reaching overhead     Signed: BROOKS,DAHARI D for Dr. Melina Schools [facility] [phone] 06/07/2015, 7:53 AM

## 2015-06-07 NOTE — Clinical Social Work Note (Signed)
Clinical Social Work Assessment  Patient Details  Name: Briana [name] MRN: [id number] Date of Birth: 06/21/1934  Date of referral:  06/07/15               Reason for consult:  Facility Placement, Discharge Planning                Permission sought to share information with:  Case Manager, Facility Sport and exercise psychologist, PCP, Family Supports Permission granted to share information::  Yes, Verbal Permission Granted  Name::      (Briana [name] and Briana [name] )  Agency::   (SNF's- [place] )  Relationship::   (Daughters )  Sport and exercise psychologist Information:   [phone])  Housing/Transportation Living arrangements for the past 2 months:  [place] of Information:  Patient Patient Interpreter Needed:  None Criminal Activity/Legal Involvement Pertinent to Current Situation/Hospitalization:  No - Comment as needed Significant Relationships:  Adult Children, Other Family Members Lives with:  Self Do you feel safe going back to the place where you live?  No Need for family participation in patient care:  Yes (Comment)  Care giving concerns: No care giving concerns reported by patient at this time.    Social Worker assessment / plan:  Holiday representative met with patient in reference to post-acute placement for SNF. CSW introduced CSW role and SNF process. CSW reviewed and provided SNF list. Patient reported she is from home alone and had several discussions with MD in regards to SNF placement. Patient reported MD is recommending that patient continues rehab at [facility] and [facility]. CSW asked patient if she had any other facilities that she would prefer. Patient stated she has already went to visit [facility] and is pleased with the facility. Patient further indicated she has discussed placement with family and they are also agreeable with SNF. No further concerns reported at this time. CSW to complete FL-2 and fax to [facility] and other facilities in [place] will continue to follow pt and pt's family for continued support and to facilitate pt's discharge needs once medically stable.   Employment status:  Retired Nurse, adult PT Recommendations:  [place] / Referral to community resources:  [place]  Patient/Family's Response to care:  Patient lying in bed alert and oriented x4. Patient agreeable to SNF placement at [facility]. Patient pleasant and appreciated social work intervention.   Patient/Family's Understanding of and Emotional Response to Diagnosis, Current Treatment, and Prognosis:  Patient knowledgeable of surgical intervention and on-going treatment. CSW remains available .  Emotional Assessment Appearance:  Appears stated age Attitude/Demeanor/Rapport:   (Pleasant ) Affect (typically observed):  Accepting, Appropriate, Pleasant Orientation:  Oriented to Self, Oriented to Place, Oriented to  Time, Oriented to Situation Alcohol / Substance use:  Never Used Psych involvement (Current and /or in the community):  No (Comment)  Discharge Needs  Concerns to be addressed:  No discharge needs identified Readmission within the last 30 days:  No Current discharge risk:  None Barriers to Discharge:  No Barriers Identified   Glendon Axe, MSW, LCSWA [phone] 06/07/2015 11:16 AM

## 2015-06-07 NOTE — Progress Notes (Signed)
Discharge orders received.  Education complete and IV removed.  Report called to Environmental consultant at [facility] and [place].  All belongings packed and sent with the patient, daughter present. Transported out via [facility]. Cori Razor, RN

## 2015-06-07 NOTE — Discharge Instructions (Signed)
Norco 1-2 tabs every 6 hrs as needed for pain control Robaxin 1 every 8 hrs as needed for muscle spasms Zofran for nausea Ambulate as much as possible No bending, twisting, or reaching overhead

## 2015-06-10 ENCOUNTER — Non-Acute Institutional Stay (SKILLED_NURSING_FACILITY): Payer: Medicare Other | Admitting: Internal Medicine

## 2015-06-10 DIAGNOSIS — G250 Essential tremor: Secondary | ICD-10-CM | POA: Diagnosis not present

## 2015-06-10 DIAGNOSIS — IMO0002 Reserved for concepts with insufficient information to code with codable children: Secondary | ICD-10-CM

## 2015-06-10 DIAGNOSIS — M1710 Unilateral primary osteoarthritis, unspecified knee: Secondary | ICD-10-CM

## 2015-06-10 DIAGNOSIS — M4806 Spinal stenosis, lumbar region: Secondary | ICD-10-CM

## 2015-06-10 DIAGNOSIS — K2100 Gastro-esophageal reflux disease with esophagitis, without bleeding: Secondary | ICD-10-CM

## 2015-06-10 DIAGNOSIS — N6019 Diffuse cystic mastopathy of unspecified breast: Secondary | ICD-10-CM | POA: Diagnosis not present

## 2015-06-10 DIAGNOSIS — M48061 Spinal stenosis, lumbar region without neurogenic claudication: Secondary | ICD-10-CM

## 2015-06-10 DIAGNOSIS — M179 Osteoarthritis of knee, unspecified: Secondary | ICD-10-CM

## 2015-06-10 DIAGNOSIS — K5901 Slow transit constipation: Secondary | ICD-10-CM

## 2015-06-10 DIAGNOSIS — R600 Localized edema: Secondary | ICD-10-CM

## 2015-06-10 NOTE — Progress Notes (Signed)
Patient ID: Briana [name], female   DOB: 01-25-1934, 81 y.o.   MRN: [id number]      [facility]   PCP: Alesia Richards, MD  Code Status: full code  Allergies  Allergen Reactions  . Accupril [Quinapril Hcl] Cough  . Neosporin [Neomycin-Bacitracin Zn-Polymyx]   . Prednisone     Agitated and shaky  . Reglan [Metoclopramide] Other (See Comments)    tremors  . Tramadol Nausea And Vomiting  . Adhesive [Tape] Rash    Chief Complaint  Patient presents with  . New Admit To SNF     HPI:  81 y.o. patient is here for short term rehabilitation post hospital admission from 06/05/15-06/07/15 with lumbar stenosis. She underwent L3-5 decompression. Her pain is currently under control. She has been constipated. She denies any other concern this visit. No new concern from staff. She has PMH of HTN, GERD, prediabetes, HLD among others.  Review of Systems:  Constitutional: Negative for fever, chills, malaise and diaphoresis.  HENT: Negative for headache, congestion, nasal discharge Eyes: Negative for eye pain, blurred vision, double vision and discharge.  Respiratory: Negative for cough, shortness of breath and wheezing.   Cardiovascular: Negative for chest pain, palpitations, leg swelling.  Gastrointestinal: Negative for heartburn, nausea, vomiting, abdominal pain. Last bowel movement 2 days back Genitourinary: Negative for dysuria, flank pain.  Musculoskeletal: Negative for back pain, falls Skin: Negative for itching, rash.  Neurological: Negative for dizziness, tingling, focal weakness Psychiatric/Behavioral: Negative for depression    Past Medical History  Diagnosis Date  . Diverticulitis   . Hyperlipidemia   . Pre-diabetes   . Vitamin D deficiency   . Osteoporosis   . Varicose veins   . Hx of irritable bowel syndrome   . Occasional tremors   . Heart murmur   . Diabetes mellitus without complication     "pre-diabetes"  . History of  pneumonia   . GERD (gastroesophageal reflux disease)     takes ranitidine  . DJD (degenerative joint disease)   . DDD (degenerative disc disease)   . Cancer     skin cancer  . History of hiatal hernia    Past Surgical History  Procedure Laterality Date  . Thyroid surgery    . Varicose veins stripped    . Tonsillectomy    . Appendectomy    . Tubal ligation    . Open reduction internal fixation (orif) distal radial fracture Right 01/10/2014    Procedure: OPEN REDUCTION INTERNAL FIXATION (ORIF) DISTAL RADIAL FRACTURE;  Surgeon: Linna Hoff, MD;  Location: [facility];  Service: Orthopedics;  Laterality: Right;  . Colonoscopy    . Esophagogastroduodenoscopy    . Breast surgery Right     fluid drained from breast  . Joint replacement Left     left hip  . Spinal cord decompression  06/05/2015    L3 L 5  . Lumbar laminectomy/decompression microdiscectomy N/A 06/05/2015    Procedure: L3-L5 DECOMPRESSION ;  Surgeon: Melina Schools, MD;  Location: [facility];  Service: Orthopedics;  Laterality: N/A;   Social History:   reports that she has never smoked. She has never used smokeless tobacco. She reports that she does not drink alcohol or use illicit drugs.  Family History  Problem Relation Age of Onset  . Hypertension Mother   . Hyperlipidemia Mother   . Heart disease Sister   . Cancer Brother     prostate    Medications:   Medication List  This list is accurate as of: 06/10/15  1:09 PM.  Always use your most recent med list.               acetaminophen 650 MG CR tablet  Commonly known as:  TYLENOL  Take 650 mg by mouth every 8 (eight) hours as needed for pain.     albuterol 108 (90 BASE) MCG/ACT inhaler  Commonly known as:  PROVENTIL HFA;VENTOLIN HFA  Inhale 2 puffs into the lungs every 6 (six) hours as needed for wheezing or shortness of breath.     b complex vitamins tablet  Take 1 tablet by mouth daily.     bumetanide 1 MG tablet  Commonly known as:  BUMEX  TAKE  ONE-HALF TO ONE TABLET BY MOUTH TWICE DAILY AS NEEDED FOR SWELLING     CALCIUM 600 PO  Take 600 mg by mouth every evening.     HYDROcodone-acetaminophen 5-325 MG per tablet  Commonly known as:  NORCO  Take 1-2 tablets by mouth every 6 (six) hours as needed for moderate pain.     Magnesium 250 MG Tabs  Take 250 mg by mouth every evening.     methocarbamol 500 MG tablet  Commonly known as:  ROBAXIN  Take 1 tablet (500 mg total) by mouth 3 (three) times daily as needed for muscle spasms.     ondansetron 4 MG tablet  Commonly known as:  ZOFRAN  Take 1 tablet (4 mg total) by mouth every 8 (eight) hours as needed for nausea or vomiting.     OSTEO BI-FLEX JOINT SHIELD PO  Take 1 tablet by mouth 2 (two) times daily.     OVER THE COUNTER MEDICATION  Apply 1 application topically as needed (pain). OTC roll-on pain med     propranolol ER 80 MG 24 hr capsule  Commonly known as:  INDERAL LA  TAKE ONE CAPSULE BY MOUTH ONCE DAILY FOR  TREMORS.     ranitidine 300 MG tablet  Commonly known as:  ZANTAC  TAKE ONE TABLET BY MOUTH TWICE DAILY FOR  ACID  REFLUX     SALONPAS EX  Apply 1 application topically as needed.     tamoxifen 20 MG tablet  Commonly known as:  NOLVADEX  Take 1 tablet (20 mg total) by mouth daily.     Vitamin D3 2000 UNITS Tabs  Take 2,000-4,000 Units by mouth 2 (two) times daily. Take 4000 units in the morning and 2000 units in the evening     vitamin E 400 UNIT capsule  Take 400 Units by mouth 2 (two) times daily.         Physical Exam: Filed Vitals:   06/10/15 1308  BP: 116/66  Pulse: 66  Temp: 99 F (37.2 C)  Resp: 18  SpO2: 94%    General- elderly female, well built, in no acute distress Head- normocephalic, atraumatic Throat- moist mucus membrane Eyes- no pallor, no icterus, no discharge, normal conjunctiva, normal sclera Neck- no cervical lymphadenopathy, no jugular vein distension Cardiovascular- normal s1,s2, no murmurs, palpable dorsalis  pedis and radial pulses, trace leg edema Respiratory- bilateral clear to auscultation, no wheeze, no rhonchi, no crackles, no use of accessory muscles Abdomen- bowel sounds present, soft, non tender, no guarding or rigidity, no CVA tenderness Musculoskeletal- able to move all 4 extremities, surgical incision on the back with honeycomb dressing, dry and clean, generalized weakness of lower extremities Neurological- no focal deficit, alert and oriented to person, place and time Skin- warm  and dry Psychiatry- normal mood and affect    Labs reviewed: Basic Metabolic Panel:  Recent Labs  10/09/14 1411 01/21/15 1437 04/25/15 1117 05/30/15 0959  NA 141 142 139 141  K 4.0 4.5 4.4 4.2  CL 101 106 101 105  CO2 33* 28 28 30   GLUCOSE 97 151* 97 114*  BUN 15 16 17 16   CREATININE 0.91 0.88 0.97 0.98  CALCIUM 9.9 9.5 10.3 10.1  MG 1.9 2.2 2.3  --    Liver Function Tests:  Recent Labs  10/09/14 1411 01/21/15 1437 04/25/15 1117  AST 19 19 20   ALT 11 12 13   ALKPHOS 46 54 50  BILITOT 0.5 0.4 0.5  PROT 5.9* 6.1 6.7  ALBUMIN 4.0 4.1 4.2   No results for input(s): LIPASE, AMYLASE in the last 8760 hours. No results for input(s): AMMONIA in the last 8760 hours. CBC:  Recent Labs  10/09/14 1411 01/21/15 1437 04/25/15 1117 05/30/15 0959  WBC 6.2 6.7 7.4 6.1  NEUTROABS 3.5 4.0 4.7  --   HGB 12.8 13.3 13.3 13.7  HCT 37.8 40.8 40.2 41.6  MCV 89.2 91.5 89.5 91.2  PLT 193 199 212 185   CBG:  Recent Labs  05/30/15 0906 06/05/15 1619  GLUCAP 134* 104*      Assessment/Plan  Lumbar spinal stenosis S/p L3-5 decompression.surgical incision healing well. Has follow up with orthopedics. Continue norco 5-325 mg 1-2 tab q6h prn pain, robaxin 500 mg tid prn spasm. Will have her work with physical therapy and occupational therapy team to help with gait training and muscle strengthening exercises.fall precautions. Skin care. Encourage to be out of bed.   Constipation On miralax  daily, add senna s 2 tab daily and change miralax to bid x 3 days, then daily and reassess. Encouraged hydration  OA Continue calcium and vitamin d supplement with tylenol prn pain. Continue osteobiflex  Leg edema Trace at present, on bumex 0.5 mg bid prn swelling, monitor for now  Essential tremors Stable, continue propranolol 80 mg daily  gerd Stable, continue zantac 300 mg bid  Diffuse cystic mastopathy Continue home regimen tamoxifen for now   Goals of care: short term rehabilitation   Labs/tests ordered: none  Family/ staff Communication: reviewed care plan with patient and nursing supervisor    Blanchie Serve, MD  [facility] [phone] (Monday-Friday 8 am - 5 pm) [phone] (afterhours)

## 2015-06-18 ENCOUNTER — Encounter: Payer: Self-pay | Admitting: Adult Health

## 2015-06-18 ENCOUNTER — Non-Acute Institutional Stay (SKILLED_NURSING_FACILITY): Payer: Medicare Other | Admitting: Adult Health

## 2015-06-18 DIAGNOSIS — M179 Osteoarthritis of knee, unspecified: Secondary | ICD-10-CM | POA: Diagnosis not present

## 2015-06-18 DIAGNOSIS — G250 Essential tremor: Secondary | ICD-10-CM | POA: Diagnosis not present

## 2015-06-18 DIAGNOSIS — K2100 Gastro-esophageal reflux disease with esophagitis, without bleeding: Secondary | ICD-10-CM

## 2015-06-18 DIAGNOSIS — R600 Localized edema: Secondary | ICD-10-CM

## 2015-06-18 DIAGNOSIS — M48061 Spinal stenosis, lumbar region without neurogenic claudication: Secondary | ICD-10-CM

## 2015-06-18 DIAGNOSIS — K5901 Slow transit constipation: Secondary | ICD-10-CM | POA: Diagnosis not present

## 2015-06-18 DIAGNOSIS — M4806 Spinal stenosis, lumbar region: Secondary | ICD-10-CM

## 2015-06-18 DIAGNOSIS — M1710 Unilateral primary osteoarthritis, unspecified knee: Secondary | ICD-10-CM

## 2015-06-18 DIAGNOSIS — G4700 Insomnia, unspecified: Secondary | ICD-10-CM | POA: Diagnosis not present

## 2015-06-18 DIAGNOSIS — N6019 Diffuse cystic mastopathy of unspecified breast: Secondary | ICD-10-CM | POA: Diagnosis not present

## 2015-06-18 DIAGNOSIS — IMO0002 Reserved for concepts with insufficient information to code with codable children: Secondary | ICD-10-CM

## 2015-06-20 ENCOUNTER — Encounter: Payer: Self-pay | Admitting: Internal Medicine

## 2015-06-25 ENCOUNTER — Ambulatory Visit: Payer: Self-pay | Admitting: Internal Medicine

## 2015-06-26 ENCOUNTER — Telehealth: Payer: Self-pay | Admitting: *Deleted

## 2015-06-26 NOTE — Telephone Encounter (Signed)
Patient's daughter called and asked if patient needs to continue Propranolol for the treatment of tremors.  Patient had back surgery and no longer has tremors.  Per Dr Melford Aase, the med is also used for BP and to prevent heart attack and he suggest continuing the med until her next OV.

## 2015-07-02 ENCOUNTER — Ambulatory Visit: Payer: Self-pay | Admitting: Internal Medicine

## 2015-07-30 ENCOUNTER — Other Ambulatory Visit: Payer: Self-pay | Admitting: Internal Medicine

## 2015-07-30 DIAGNOSIS — Z1231 Encounter for screening mammogram for malignant neoplasm of breast: Secondary | ICD-10-CM

## 2015-07-31 ENCOUNTER — Ambulatory Visit (HOSPITAL_COMMUNITY)
Admission: RE | Admit: 2015-07-31 | Discharge: 2015-07-31 | Disposition: A | Discharge status: Home / Self Care | Payer: Medicare Other | Source: Ambulatory Visit | Attending: Internal Medicine | Admitting: Internal Medicine

## 2015-07-31 DIAGNOSIS — Z1231 Encounter for screening mammogram for malignant neoplasm of breast: Secondary | ICD-10-CM | POA: Diagnosis not present

## 2015-08-14 ENCOUNTER — Other Ambulatory Visit: Payer: Self-pay | Admitting: Internal Medicine

## 2015-08-28 ENCOUNTER — Encounter: Payer: Self-pay | Admitting: Internal Medicine

## 2015-08-28 ENCOUNTER — Ambulatory Visit (INDEPENDENT_AMBULATORY_CARE_PROVIDER_SITE_OTHER): Payer: Medicare Other | Admitting: Internal Medicine

## 2015-08-28 VITALS — BP 122/78 | HR 72 | Temp 97.9°F | Resp 16 | Ht 61.25 in | Wt 153.6 lb

## 2015-08-28 DIAGNOSIS — E559 Vitamin D deficiency, unspecified: Secondary | ICD-10-CM

## 2015-08-28 DIAGNOSIS — K2100 Gastro-esophageal reflux disease with esophagitis, without bleeding: Secondary | ICD-10-CM

## 2015-08-28 DIAGNOSIS — R7303 Prediabetes: Secondary | ICD-10-CM

## 2015-08-28 DIAGNOSIS — Z1212 Encounter for screening for malignant neoplasm of rectum: Secondary | ICD-10-CM

## 2015-08-28 DIAGNOSIS — I10 Essential (primary) hypertension: Secondary | ICD-10-CM | POA: Diagnosis not present

## 2015-08-28 DIAGNOSIS — E782 Mixed hyperlipidemia: Secondary | ICD-10-CM | POA: Diagnosis not present

## 2015-08-28 DIAGNOSIS — Z1331 Encounter for screening for depression: Secondary | ICD-10-CM

## 2015-08-28 DIAGNOSIS — Z23 Encounter for immunization: Secondary | ICD-10-CM

## 2015-08-28 DIAGNOSIS — Z79899 Other long term (current) drug therapy: Secondary | ICD-10-CM

## 2015-08-28 DIAGNOSIS — Z9181 History of falling: Secondary | ICD-10-CM

## 2015-08-28 DIAGNOSIS — R7309 Other abnormal glucose: Secondary | ICD-10-CM | POA: Diagnosis not present

## 2015-08-28 LAB — CBC WITH DIFFERENTIAL/PLATELET
Basophils Absolute: 0.1 10*3/uL (ref 0.0–0.1)
Basophils Relative: 1 % (ref 0–1)
Eosinophils Absolute: 0.1 10*3/uL (ref 0.0–0.7)
Eosinophils Relative: 2 % (ref 0–5)
HCT: 37.9 % (ref 36.0–46.0)
Hemoglobin: 12.7 g/dL (ref 12.0–15.0)
Lymphocytes Relative: 20 % (ref 12–46)
Lymphs Abs: 1.3 10*3/uL (ref 0.7–4.0)
MCH: 29.7 pg (ref 26.0–34.0)
MCHC: 33.5 g/dL (ref 30.0–36.0)
MCV: 88.6 fL (ref 78.0–100.0)
MPV: 10.7 fL (ref 8.6–12.4)
Monocytes Absolute: 0.7 10*3/uL (ref 0.1–1.0)
Monocytes Relative: 11 % (ref 3–12)
Neutro Abs: 4.2 10*3/uL (ref 1.7–7.7)
Neutrophils Relative %: 66 % (ref 43–77)
Platelets: 229 10*3/uL (ref 150–400)
RBC: 4.28 MIL/uL (ref 3.87–5.11)
RDW: 14.5 % (ref 11.5–15.5)
WBC: 6.4 10*3/uL (ref 4.0–10.5)

## 2015-08-28 LAB — HEPATIC FUNCTION PANEL
ALT: 11 U/L (ref 6–29)
AST: 19 U/L (ref 10–35)
Albumin: 4.2 g/dL (ref 3.6–5.1)
Alkaline Phosphatase: 56 U/L (ref 33–130)
Bilirubin, Direct: 0.1 mg/dL (ref <=0.2)
Indirect Bilirubin: 0.3 mg/dL (ref 0.2–1.2)
Total Bilirubin: 0.4 mg/dL (ref 0.2–1.2)
Total Protein: 6.3 g/dL (ref 6.1–8.1)

## 2015-08-28 LAB — LIPID PANEL
Cholesterol: 176 mg/dL (ref 125–200)
HDL: 63 mg/dL (ref >=46)
LDL Cholesterol: 92 mg/dL (ref <130)
Total CHOL/HDL Ratio: 2.8 Ratio (ref <=5.0)
Triglycerides: 104 mg/dL (ref <150)
VLDL: 21 mg/dL (ref <30)

## 2015-08-28 LAB — BASIC METABOLIC PANEL WITH GFR
BUN: 13 mg/dL (ref 7–25)
CO2: 28 mmol/L (ref 20–31)
Calcium: 9.6 mg/dL (ref 8.6–10.4)
Chloride: 105 mmol/L (ref 98–110)
Creat: 0.89 mg/dL (ref 0.60–0.93)
GFR, Est African American: 71 mL/min (ref >=60)
GFR, Est Non African American: 62 mL/min (ref >=60)
Glucose, Bld: 107 mg/dL — ABNORMAL HIGH (ref 65–99)
Potassium: 3.9 mmol/L (ref 3.5–5.3)
Sodium: 142 mmol/L (ref 135–146)

## 2015-08-28 LAB — MAGNESIUM: Magnesium: 2.1 mg/dL (ref 1.5–2.5)

## 2015-08-28 NOTE — Progress Notes (Signed)
Patient ID: Briana [name], female   DOB: 1934/11/12, 81 y.o.   MRN: [id number] CPE rescheduled

## 2015-08-29 LAB — URINALYSIS, ROUTINE W REFLEX MICROSCOPIC
Bilirubin Urine: NEGATIVE
Glucose, UA: NEGATIVE
Hgb urine dipstick: NEGATIVE
Ketones, ur: NEGATIVE
Nitrite: NEGATIVE
Protein, ur: NEGATIVE
Specific Gravity, Urine: 1.013 (ref 1.001–1.035)
pH: 7 (ref 5.0–8.0)

## 2015-08-29 LAB — TSH: TSH: 1.314 u[IU]/mL (ref 0.350–4.500)

## 2015-08-29 LAB — URINALYSIS, MICROSCOPIC ONLY
Casts: NONE SEEN [LPF]
Crystals: NONE SEEN [HPF]
RBC / HPF: NONE SEEN RBC/HPF (ref <=2)
Yeast: NONE SEEN [HPF]

## 2015-08-29 LAB — MICROALBUMIN / CREATININE URINE RATIO
Creatinine, Urine: 64.9 mg/dL
Microalb Creat Ratio: 26.2 mg/g (ref 0.0–30.0)
Microalb, Ur: 1.7 mg/dL (ref <2.0)

## 2015-08-29 LAB — HEMOGLOBIN A1C
Hgb A1c MFr Bld: 6.3 % — ABNORMAL HIGH (ref <5.7)
Mean Plasma Glucose: 134 mg/dL — ABNORMAL HIGH (ref <117)

## 2015-08-29 LAB — VITAMIN D 25 HYDROXY (VIT D DEFICIENCY, FRACTURES): Vit D, 25-Hydroxy: 71 ng/mL (ref 30–100)

## 2015-08-29 LAB — INSULIN, RANDOM: Insulin: 5.5 u[IU]/mL (ref 2.0–19.6)

## 2015-09-05 ENCOUNTER — Encounter: Payer: Self-pay | Admitting: Internal Medicine

## 2015-09-05 ENCOUNTER — Ambulatory Visit (INDEPENDENT_AMBULATORY_CARE_PROVIDER_SITE_OTHER): Payer: Medicare Other | Admitting: Internal Medicine

## 2015-09-05 VITALS — BP 142/84 | HR 68 | Temp 97.3°F | Resp 16 | Ht 61.5 in | Wt 154.0 lb

## 2015-09-05 DIAGNOSIS — E782 Mixed hyperlipidemia: Secondary | ICD-10-CM

## 2015-09-05 DIAGNOSIS — Z789 Other specified health status: Secondary | ICD-10-CM | POA: Diagnosis not present

## 2015-09-05 DIAGNOSIS — R7303 Prediabetes: Secondary | ICD-10-CM

## 2015-09-05 DIAGNOSIS — Z0001 Encounter for general adult medical examination with abnormal findings: Secondary | ICD-10-CM | POA: Diagnosis not present

## 2015-09-05 DIAGNOSIS — E669 Obesity, unspecified: Secondary | ICD-10-CM | POA: Insufficient documentation

## 2015-09-05 DIAGNOSIS — Z23 Encounter for immunization: Secondary | ICD-10-CM | POA: Diagnosis not present

## 2015-09-05 DIAGNOSIS — Z6828 Body mass index (BMI) 28.0-28.9, adult: Secondary | ICD-10-CM

## 2015-09-05 DIAGNOSIS — Z1389 Encounter for screening for other disorder: Secondary | ICD-10-CM | POA: Diagnosis not present

## 2015-09-05 DIAGNOSIS — M810 Age-related osteoporosis without current pathological fracture: Secondary | ICD-10-CM

## 2015-09-05 DIAGNOSIS — Z1331 Encounter for screening for depression: Secondary | ICD-10-CM

## 2015-09-05 DIAGNOSIS — Z1212 Encounter for screening for malignant neoplasm of rectum: Secondary | ICD-10-CM

## 2015-09-05 DIAGNOSIS — K2100 Gastro-esophageal reflux disease with esophagitis, without bleeding: Secondary | ICD-10-CM

## 2015-09-05 DIAGNOSIS — R6889 Other general symptoms and signs: Secondary | ICD-10-CM

## 2015-09-05 DIAGNOSIS — I10 Essential (primary) hypertension: Secondary | ICD-10-CM | POA: Diagnosis not present

## 2015-09-05 DIAGNOSIS — Z79899 Other long term (current) drug therapy: Secondary | ICD-10-CM

## 2015-09-05 DIAGNOSIS — Z9181 History of falling: Secondary | ICD-10-CM

## 2015-09-05 NOTE — Patient Instructions (Signed)

## 2015-09-05 NOTE — Progress Notes (Addendum)
Patient ID: Briana [name], female   DOB: Jan 06, 1934, 81 y.o.   MRN: [id number]   Annual Preventative Visit And Comprehensive Evaluation,  Examination & Management  This very nice 81 y.o. twice widowed WF presents for  presents for a Preventative Visit & comprehensive evaluation and management of multiple medical co-morbidities.  Patient has been followed for HTN, Prediabetes, Hyperlipidemia, GERD  and Vitamin D Deficiency. Patient has ongoing LBP due to Spinal Stenosis and has received EDSI's from Dr Nelva Bush. Currently she's having more problems/pain with her Left knee and is also seeing Dr Rolena Infante    HTN predates since 1996. Patient's BP has been controlled at home and patient denies any cardiac symptoms as chest pain, palpitations, shortness of breath, dizziness or ankle swelling. Today's BP: (!) 142/84 mmHg    Patient's hyperlipidemia is controlled with diet and medications. Patient denies myalgias or other medication SE's. Today's lipids are at goal with Cholesterol 176; HDL 63; LDL Cholesterol 92; & Triglycerides 104 on 04/25/2015.   Patient has prediabetes predating since Apr 2011 with A1c 6.6% and since then her A1c's have ranged betw 6.2-6.4%.   Patient denies reactive hypoglycemic symptoms, visual blurring, diabetic polys, or paresthesias.  Today's  A1c is still not at goal at A1c of 6.3%   Finally, patient has history of Vitamin D Deficiency of 23 in 2008 and today's Vitamin D was at goal at  71.      Medication Sig  . acetaminophen (TYLENOL) 650 MG CR tablet Take 650 mg by mouth every 8 (eight) hours as needed for pain.  Marland Kitchen albuterol (PROVENTIL HFA;VENTOLIN HFA) 108 (90 BASE) MCG/ACT inhaler Inhale 2 puffs into the lungs every 6 (six) hours as needed for wheezing or shortness of breath.  . Ascorbic Acid (VITAMIN C) 1000 MG tablet Take 1,000 mg by mouth 2 (two) times daily.  Marland Kitchen aspirin 81 MG tablet Take 81 mg by mouth daily.  Marland Kitchen b complex vitamins tablet Take 1 tablet by mouth daily.  .  bumetanide (BUMEX) 1 MG tablet TAKE ONE-HALF TO ONE TABLET BY MOUTH TWICE DAILY AS NEEDED FOR SWELLING  . Calcium Carbonate (CALCIUM 600 PO) Take 600 mg by mouth every evening.  . Cholecalciferol (VITAMIN D3) 2000 UNITS TABS Take 2,000-4,000 Units by mouth 2 (two) times daily. Take 4000 units in the morning and 2000 units in the evening  . MELATONIN PO Take by mouth. Takes 1 at bedtime  . Misc Natural Products (OSTEO BI-FLEX JOINT SHIELD PO) Take 1 tablet by mouth 2 (two) times daily.   . propranolol ER (INDERAL LA) 80 MG 24 hr capsule TAKE ONE CAPSULE BY MOUTH ONCE DAILY FOR  TREMORS.  . ranitidine (ZANTAC) 300 MG tablet TAKE ONE TABLET BY MOUTH TWICE DAILY FOR ACID REFLUX  . tamoxifen (NOLVADEX) 20 MG tablet Take 1 tablet (20 mg total) by mouth daily.  . vitamin E 400 UNIT capsule Take 400 Units by mouth 2 (two) times daily.  Lebron Quam 5-325  Take 1-2 tablets by mouth every 6 (six) hours as needed for moderate pain.   Allergies  Allergen Reactions  . Accupril [Quinapril Hcl] Cough  . Neosporin [Neomycin-Bacitracin Zn-Polymyx]   . Prednisone     Agitated and shaky  . Reglan [Metoclopramide] Other (See Comments)    tremors  . Tramadol Nausea And Vomiting  . Adhesive [Tape] Rash   Past Medical History  Diagnosis Date  . Diverticulitis   . Hyperlipidemia   . Pre-diabetes   . Vitamin D deficiency   .  Osteoporosis   . Varicose veins   . Hx of irritable bowel syndrome   . Occasional tremors   . Heart murmur   . Diabetes mellitus without complication ([place])     "pre-diabetes"  . History of pneumonia   . GERD (gastroesophageal reflux disease)     takes ranitidine  . DJD (degenerative joint disease)   . DDD (degenerative disc disease)   . Cancer ([place])     skin cancer  . History of hiatal hernia    Health Maintenance  Topic Date Due  . TETANUS/TDAP  11/29/1954  . PNA vac Low Risk Adult (2 of 2 - PCV13) 11/16/2002  . INFLUENZA VACCINE  06/16/2016  . DEXA SCAN  Completed  .  ZOSTAVAX  Completed   Immunization History  Administered Date(s) Administered  . DT 09/05/2015  . DTaP 11/16/2004  . Influenza Whole 08/30/2013  . Influenza, High Dose Seasonal PF 08/28/2015  . Pneumococcal Polysaccharide-23 11/16/2001  . Zoster 06/01/2013   Past Surgical History  Procedure Laterality Date  . Thyroid surgery    . Varicose veins stripped    . Tonsillectomy    . Appendectomy    . Tubal ligation    . Open reduction internal fixation (orif) distal radial fracture Right 01/10/2014    Procedure: OPEN REDUCTION INTERNAL FIXATION (ORIF) DISTAL RADIAL FRACTURE;  Surgeon: Linna Hoff, MD;  Location: [facility];  Service: Orthopedics;  Laterality: Right;  . Colonoscopy    . Esophagogastroduodenoscopy    . Breast surgery Right     fluid drained from breast  . Joint replacement Left     left hip  . Spinal cord decompression  06/05/2015    L3 L 5  . Lumbar laminectomy/decompression microdiscectomy N/A 06/05/2015    Procedure: L3-L5 DECOMPRESSION ;  Surgeon: Melina Schools, MD;  Location: [facility];  Service: Orthopedics;  Laterality: N/A;   Family History  Problem Relation Age of Onset  . Hypertension Mother   . Hyperlipidemia Mother   . Heart disease Sister   . Cancer Brother     prostate   Social History  Substance Use Topics  . Smoking status: Never Smoker   . Smokeless tobacco: Never Used  . Alcohol Use: No    ROS Constitutional: Denies fever, chills, weight loss/gain, headaches, insomnia,  night sweats, and change in appetite. Does c/o fatigue. Eyes: Denies redness, blurred vision, diplopia, discharge, itchy, watery eyes.  ENT: Denies discharge, congestion, post nasal drip, epistaxis, sore throat, earache, hearing loss, dental pain, Tinnitus, Vertigo, Sinus pain, snoring.  Cardio: Denies chest pain, palpitations, irregular heartbeat, syncope, dyspnea, diaphoresis, orthopnea, PND, claudication, edema Respiratory: denies cough, dyspnea, DOE, pleurisy, hoarseness,  laryngitis, wheezing.  Gastrointestinal: Denies dysphagia, heartburn, reflux, water brash, pain, cramps, nausea, vomiting, bloating, diarrhea, constipation, hematemesis, melena, hematochezia, jaundice, hemorrhoids Genitourinary: Denies dysuria, frequency, urgency, nocturia, hesitancy, discharge, hematuria, flank pain Breast: Breast lumps, nipple discharge, bleeding.  Musculoskeletal: Denies arthralgia, myalgia, stiffness, Jt. Swelling, pain, limp, and strain/sprain. Denies falls. Skin: Denies puritis, rash, hives, warts, acne, eczema, changing in skin lesion Neuro: No weakness, tremor, incoordination, spasms, paresthesia, pain Psychiatric: Denies confusion, memory loss, sensory loss. Denies Depression. Endocrine: Denies change in weight, skin, hair change, nocturia, and paresthesia, diabetic polys, visual blurring, hyper / hypo glycemic episodes.  Heme/Lymph: No excessive bleeding, bruising, enlarged lymph nodes.  Physical Exam  BP 142/84 mmHg  Pulse 68  Temp(Src) 97.3 F (36.3 C)  Resp 16  Ht 5' 1.5" (1.562 m)  Wt 154 lb (69.854  kg)  BMI 28.63 kg/m2  General Appearance: Well nourished and in no apparent distress. Eyes: PERRLA, EOMs, conjunctiva no swelling or erythema, normal fundi and vessels. Sinuses: No frontal/maxillary tenderness ENT/Mouth: EACs patent / TMs  nl. Nares clear without erythema, swelling, mucoid exudates. Oral hygiene is good. No erythema, swelling, or exudate. Tongue normal, non-obstructing. Tonsils not swollen or erythematous. Hearing normal.  Neck: Supple, thyroid normal. No bruits, nodes or JVD. Respiratory: Respiratory effort normal.  BS equal and clear bilateral without rales, rhonci, wheezing or stridor. Cardio: Heart sounds are normal with regular rate and rhythm and no murmurs, rubs or gallops. Peripheral pulses are normal and equal bilaterally without edema. No aortic or femoral bruits. Chest: symmetric with normal excursions and percussion. Breasts:  Symmetric, without lumps, nipple discharge, retractions, or fibrocystic changes.  Abdomen: Flat, soft, with bowel sounds. Nontender, no guarding, rebound, hernias, masses, or organomegaly.  Lymphatics: Non tender without lymphadenopathy.  Musculoskeletal: Full ROM all peripheral extremities, joint stability, 5/5 strength, and normal gait. Skin: Warm and dry without rashes, lesions, cyanosis, clubbing or  ecchymosis. Scattered scaly Seborrheic Keratoses of the trunk & extremities.  Neuro: Cranial nerves intact, reflexes equal bilaterally. Normal muscle tone, no cerebellar symptoms. Sensation intact.  Pysch: Alert and oriented X 3, normal affect, Insight and Judgment appropriate.   Assessment and Plan  1. Encounter for general adult medical examination with abnormal findings   2. Essential hypertension  - EKG 12-Lead - [place], RETROPERITNL ABD,  LTD  3. Hyperlipidemia   4. Prediabetes   5. GERD   6. Osteoporosis   7. Screening for rectal cancer   8. Depression screen   9. Medication management   Continue prudent diet as discussed, weight control, BP monitoring, regular exercise, and medications. Discussed med's effects and SE's. Screening labs and tests as done pre visit & resulted were reviewed with patient. Recommended  regular follow-up as recommended.  Over 40 minutes of exam, counseling, chart review was performed.

## 2015-09-18 DIAGNOSIS — M1712 Unilateral primary osteoarthritis, left knee: Secondary | ICD-10-CM | POA: Diagnosis not present

## 2015-09-25 ENCOUNTER — Other Ambulatory Visit: Payer: Self-pay | Admitting: Internal Medicine

## 2015-09-25 DIAGNOSIS — M1712 Unilateral primary osteoarthritis, left knee: Secondary | ICD-10-CM | POA: Diagnosis not present

## 2015-10-02 DIAGNOSIS — M1712 Unilateral primary osteoarthritis, left knee: Secondary | ICD-10-CM | POA: Diagnosis not present

## 2015-10-13 NOTE — Progress Notes (Signed)
Patient ID: [name], female   DOB: Oct 20, 1934, 81 y.o.   MRN: [id number]    DATE:  06/18/15  MRN:  [id number]  BIRTHDAY: August 26, 1934  Facility:  Nursing Home Location:  [place] Room Number: 807-P  LEVEL OF CARE:  SNF [phone])  Contact Information    Name Relation Home Work [place] Daughter [phone]  [phone]   Burley Saver Daughter [phone]  [phone]      Chief Complaint  Patient presents with  . Discharge Note    Lumbar stenosis S/P decompression of L3-5, constipation, essential tremor, bilateral lower extremity edema, GERD, diffuse cystic mastopathy, osteoarthrosis and insomnia    HISTORY OF PRESENT ILLNESS:  This is a 81 year old female who is for discharge home with home health PT for endurance, OT for ADLs and CNA for showers. She has been admitted to [facility] on 06/07/15 from [facility]. She has PMH of hypertension, GERD, prediabetes and hyperlipidemia. She has lumbar stenosis for which she had L3-5 decompression on 06/05/15.  Patient was admitted to this facility for short-term rehabilitation after the patient's recent hospitalization.  Patient has completed SNF rehabilitation and therapy has cleared the patient for discharge.  PAST MEDICAL HISTORY:  Past Medical History  Diagnosis Date  . Diverticulitis   . Hyperlipidemia   . Pre-diabetes   . Vitamin D deficiency   . Osteoporosis   . Varicose veins   . Hx of irritable bowel syndrome   . Occasional tremors   . Heart murmur   . Diabetes mellitus without complication ([place])     "pre-diabetes"  . History of pneumonia   . GERD (gastroesophageal reflux disease)     takes ranitidine  . DJD (degenerative joint disease)   . DDD (degenerative disc disease)   . Cancer ([place])     skin cancer  . History of hiatal hernia     CURRENT MEDICATIONS: Reviewed  Patient's Medications  New Prescriptions   No medications on file  Previous  Medications   ACETAMINOPHEN (TYLENOL) 650 MG CR TABLET    Take 650 mg by mouth every 8 (eight) hours as needed for pain.   ALBUTEROL (PROVENTIL HFA;VENTOLIN HFA) 108 (90 BASE) MCG/ACT INHALER    Inhale 2 puffs into the lungs every 6 (six) hours as needed for wheezing or shortness of breath.   ASCORBIC ACID (VITAMIN C) 1000 MG TABLET    Take 1,000 mg by mouth 2 (two) times daily.   ASPIRIN 81 MG TABLET    Take 81 mg by mouth daily.   B COMPLEX VITAMINS TABLET    Take 1 tablet by mouth daily.   CALCIUM CARBONATE (CALCIUM 600 PO)    Take 600 mg by mouth every evening.   CHOLECALCIFEROL (VITAMIN D3) 2000 UNITS TABS    Take 2,000-4,000 Units by mouth 2 (two) times daily. Take 4000 units in the morning and 2000 units in the evening   MELATONIN 3 MG PO    Take by mouth. Takes 1 at bedtime   MISC NATURAL PRODUCTS (OSTEO BI-FLEX JOINT SHIELD PO)    Take 1 tablet by mouth 2 (two) times daily.    PROPRANOLOL ER (INDERAL LA) 80 MG 24 HR CAPSULE    TAKE ONE CAPSULE BY MOUTH ONCE DAILY FOR  TREMORS.   TAMOXIFEN (NOLVADEX) 20 MG TABLET    Take 1 tablet (20 mg total) by mouth daily.   VITAMIN E 400 UNIT CAPSULE    Take 400  Units by mouth 2 (two) times daily.  Modified Medications   Modified Medication Previous Medication   BUMETANIDE (BUMEX) 1 MG TABLET bumetanide (BUMEX) 1 MG tablet      TAKE ONE-HALF TO ONE TABLET BY MOUTH TWICE DAILY AS NEEDED FOR SWELLING    TAKE ONE-HALF TO ONE TABLET BY MOUTH TWICE DAILY AS NEEDED FOR SWELLING   RANITIDINE (ZANTAC) 300 MG TABLET ranitidine (ZANTAC) 300 MG tablet      TAKE ONE TABLET BY MOUTH TWICE DAILY FOR ACID REFLUX    TAKE ONE TABLET BY MOUTH TWICE DAILY FOR  ACID  REFLUX     HYDROCODONE-ACETAMINOPHEN (NORCO) 5-325 MG PER TABLET    Take 1-2 tablets by mouth every 6 (six) hours as needed for moderate pain.   LINIMENTS (SALONPAS EX)    Apply 1 application topically as needed.   MAGNESIUM 250 MG TABS    Take 250 mg by mouth every evening.   METHOCARBAMOL (ROBAXIN) 500  MG TABLET    Take 1 tablet (500 mg total) by mouth 3 (three) times daily as needed for muscle spasms.   ONDANSETRON (ZOFRAN) 4 MG TABLET    Take 1 tablet (4 mg total) by mouth every 8 (eight) hours as needed for nausea or vomiting.   OVER THE COUNTER MEDICATION    Apply 1 application topically as needed (pain). OTC roll-on pain med     Allergies  Allergen Reactions  . Accupril [Quinapril Hcl] Cough  . Neosporin [Neomycin-Bacitracin Zn-Polymyx]   . Prednisone     Agitated and shaky  . Reglan [Metoclopramide] Other (See Comments)    tremors  . Tramadol Nausea And Vomiting  . Adhesive [Tape] Rash     REVIEW OF SYSTEMS:  GENERAL: no change in appetite, no fatigue, no weight changes, no fever, chills or weakness EYES: Denies change in vision, dry eyes, eye pain, itching or discharge EARS: Denies change in hearing, ringing in ears, or earache NOSE: Denies nasal congestion or epistaxis MOUTH and THROAT: Denies oral discomfort, gingival pain or bleeding, pain from teeth or hoarseness   RESPIRATORY: no cough, SOB, DOE, wheezing, hemoptysis CARDIAC: no chest pain, edema or palpitations GI: no abdominal pain, diarrhea, constipation, heart burn, nausea or vomiting GU: Denies dysuria, frequency, hematuria, incontinence, or discharge PSYCHIATRIC: Denies feeling of depression or anxiety. No report of hallucinations, paranoia, or agitation, +insomnia  PHYSICAL EXAMINATION  GENERAL APPEARANCE: Well nourished. In no acute distress. Normal body habitus SKIN:  Midline lower back with honey combed dressing, dry, no erythema HEAD: Normal in size and contour. No evidence of trauma EYES: Lids open and close normally. No blepharitis, entropion or ectropion. PERRL. Conjunctivae are clear and sclerae are white. Lenses are without opacity EARS: Pinnae are normal. Patient hears normal voice tunes of the examiner MOUTH and THROAT: Lips are without lesions. Oral mucosa is moist and without lesions. Tongue is  normal in shape, size, and color and without lesions NECK: supple, trachea midline, no neck masses, no thyroid tenderness, no thyromegaly LYMPHATICS: no LAN in the neck, no supraclavicular LAN RESPIRATORY: breathing is even & unlabored, BS CTAB CARDIAC: RRR, no murmur,no extra heart sounds, no edema GI: abdomen soft, normal BS, no masses, no tenderness, no hepatomegaly, no splenomegaly EXTREMITIES:  Able to move 4 extremities; wears lumbar belt  PSYCHIATRIC: Alert and oriented X 3. Affect and behavior are appropriate  LABS/RADIOLOGY: Labs reviewed: Basic Metabolic Panel:  Recent Labs  01/21/15 1437 04/25/15 1117   AST 19 20   ALT 12  13   ALKPHOS 54 50   BILITOT 0.4 0.5   PROT 6.1 6.7   ALBUMIN 4.1 4.2     CBC:  Recent Labs  01/21/15 1437 04/25/15 1117 05/30/15 0959   WBC 6.7 7.4 6.1   NEUTROABS 4.0 4.7  --    HGB 13.3 13.3 13.7   HCT 40.8 40.2 41.6   MCV 91.5 89.5 91.2   PLT 199 212 185    Lipid Panel:  Recent Labs  01/21/15 1437 04/25/15 1117   HDL 56 54    CBG:  Recent Labs  05/30/15 0906 06/05/15 1619  GLUCAP 134* 104*    ASSESSMENT/PLAN:  Lumbar stenosis S/P L3-5 - for home health PT, OT and CNA; continue Robaxin 500 mg 1 tab by mouth 3 times a day when necessary, Norco 5/325 mg 1-2 tabs by mouth every 6 hours when necessary and Salonpas EX 1 application topically daily when necessary for pain  Constipation - continue MiraLAX 17 g by mouth daily and senna S2 tabs by mouth daily  Essential tremor - continue propanolol 80 mg 1 capsule by mouth daily  Bilateral lower extremity edema - continue Bumex 0.5 mg by mouth twice a day when necessary  GERD - continue Zantac 300 mg 1 tab by mouth twice a day  Diffuse cystic mastopathy, unspecified laterality - continue tamoxifen 20 mg 1 tab by mouth daily  Osteoarthrosis involving lower leg  - continue vitamin D3 4000 units by mouth every morning an  2000 units by mouth every evening, Tylenol 650 mg by  mouth every 8 hours and Osteo Bi-Flex 1 tab by mouth twice a day  Insomnia - start melatonin 3 mg 1 tab by mouth daily at bedtime      I have filled out patient's discharge paperwork and written prescriptions.  Patient will receive home health PT, OT and CNA.  Total discharge time: Less than 30 minutes  Discharge time involved coordination of the discharge process with Education officer, museum, nursing staff and therapy department. Medical justification for home health services verified.    [facility], NP [employer] [phone]

## 2015-10-29 DIAGNOSIS — E119 Type 2 diabetes mellitus without complications: Secondary | ICD-10-CM | POA: Diagnosis not present

## 2015-10-29 DIAGNOSIS — H01022 Squamous blepharitis right lower eyelid: Secondary | ICD-10-CM | POA: Diagnosis not present

## 2015-10-29 DIAGNOSIS — H353131 Nonexudative age-related macular degeneration, bilateral, early dry stage: Secondary | ICD-10-CM | POA: Diagnosis not present

## 2015-10-29 DIAGNOSIS — H01021 Squamous blepharitis right upper eyelid: Secondary | ICD-10-CM | POA: Diagnosis not present

## 2015-10-29 DIAGNOSIS — H25813 Combined forms of age-related cataract, bilateral: Secondary | ICD-10-CM | POA: Diagnosis not present

## 2015-10-31 ENCOUNTER — Other Ambulatory Visit: Payer: Self-pay | Admitting: Physician Assistant

## 2015-11-21 DIAGNOSIS — M1712 Unilateral primary osteoarthritis, left knee: Secondary | ICD-10-CM | POA: Diagnosis not present

## 2015-12-01 ENCOUNTER — Other Ambulatory Visit: Payer: Self-pay | Admitting: Internal Medicine

## 2015-12-19 ENCOUNTER — Ambulatory Visit (INDEPENDENT_AMBULATORY_CARE_PROVIDER_SITE_OTHER): Payer: Medicare Other | Admitting: Internal Medicine

## 2015-12-19 ENCOUNTER — Encounter: Payer: Self-pay | Admitting: Internal Medicine

## 2015-12-19 VITALS — BP 154/76 | HR 62 | Temp 98.2°F | Resp 16 | Ht 61.5 in | Wt 154.0 lb

## 2015-12-19 DIAGNOSIS — M4806 Spinal stenosis, lumbar region: Secondary | ICD-10-CM | POA: Diagnosis not present

## 2015-12-19 DIAGNOSIS — Z79899 Other long term (current) drug therapy: Secondary | ICD-10-CM | POA: Diagnosis not present

## 2015-12-19 DIAGNOSIS — N6019 Diffuse cystic mastopathy of unspecified breast: Secondary | ICD-10-CM

## 2015-12-19 DIAGNOSIS — Z6828 Body mass index (BMI) 28.0-28.9, adult: Secondary | ICD-10-CM | POA: Diagnosis not present

## 2015-12-19 DIAGNOSIS — E559 Vitamin D deficiency, unspecified: Secondary | ICD-10-CM | POA: Diagnosis not present

## 2015-12-19 DIAGNOSIS — E782 Mixed hyperlipidemia: Secondary | ICD-10-CM | POA: Diagnosis not present

## 2015-12-19 DIAGNOSIS — M810 Age-related osteoporosis without current pathological fracture: Secondary | ICD-10-CM | POA: Diagnosis not present

## 2015-12-19 DIAGNOSIS — I10 Essential (primary) hypertension: Secondary | ICD-10-CM | POA: Diagnosis not present

## 2015-12-19 DIAGNOSIS — K2100 Gastro-esophageal reflux disease with esophagitis, without bleeding: Secondary | ICD-10-CM

## 2015-12-19 DIAGNOSIS — R6889 Other general symptoms and signs: Secondary | ICD-10-CM | POA: Diagnosis not present

## 2015-12-19 DIAGNOSIS — Z0001 Encounter for general adult medical examination with abnormal findings: Secondary | ICD-10-CM

## 2015-12-19 DIAGNOSIS — Z0000 Encounter for general adult medical examination without abnormal findings: Secondary | ICD-10-CM

## 2015-12-19 DIAGNOSIS — R7303 Prediabetes: Secondary | ICD-10-CM

## 2015-12-19 DIAGNOSIS — R7309 Other abnormal glucose: Secondary | ICD-10-CM | POA: Diagnosis not present

## 2015-12-19 DIAGNOSIS — M48061 Spinal stenosis, lumbar region without neurogenic claudication: Secondary | ICD-10-CM

## 2015-12-19 LAB — CBC WITH DIFFERENTIAL/PLATELET
Basophils Absolute: 0.1 10*3/uL (ref 0.0–0.1)
Basophils Relative: 1 % (ref 0–1)
Eosinophils Absolute: 0.2 10*3/uL (ref 0.0–0.7)
Eosinophils Relative: 3 % (ref 0–5)
HCT: 40.5 % (ref 36.0–46.0)
Hemoglobin: 13.7 g/dL (ref 12.0–15.0)
Lymphocytes Relative: 24 % (ref 12–46)
Lymphs Abs: 1.3 10*3/uL (ref 0.7–4.0)
MCH: 30.4 pg (ref 26.0–34.0)
MCHC: 33.8 g/dL (ref 30.0–36.0)
MCV: 90 fL (ref 78.0–100.0)
MPV: 10.5 fL (ref 8.6–12.4)
Monocytes Absolute: 0.6 10*3/uL (ref 0.1–1.0)
Monocytes Relative: 11 % (ref 3–12)
Neutro Abs: 3.4 10*3/uL (ref 1.7–7.7)
Neutrophils Relative %: 61 % (ref 43–77)
Platelets: 189 10*3/uL (ref 150–400)
RBC: 4.5 MIL/uL (ref 3.87–5.11)
RDW: 14.8 % (ref 11.5–15.5)
WBC: 5.5 10*3/uL (ref 4.0–10.5)

## 2015-12-19 LAB — TSH: TSH: 0.888 u[IU]/mL (ref 0.350–4.500)

## 2015-12-19 LAB — BASIC METABOLIC PANEL WITH GFR
BUN: 17 mg/dL (ref 7–25)
CO2: 29 mmol/L (ref 20–31)
Calcium: 9.4 mg/dL (ref 8.6–10.4)
Chloride: 106 mmol/L (ref 98–110)
Creat: 0.87 mg/dL (ref 0.60–0.88)
GFR, Est African American: 73 mL/min (ref >=60)
GFR, Est Non African American: 63 mL/min (ref >=60)
Glucose, Bld: 111 mg/dL — ABNORMAL HIGH (ref 65–99)
Potassium: 4.3 mmol/L (ref 3.5–5.3)
Sodium: 142 mmol/L (ref 135–146)

## 2015-12-19 LAB — LIPID PANEL
Cholesterol: 180 mg/dL (ref 125–200)
HDL: 65 mg/dL (ref >=46)
LDL Cholesterol: 103 mg/dL (ref <130)
Total CHOL/HDL Ratio: 2.8 Ratio (ref <=5.0)
Triglycerides: 60 mg/dL (ref <150)
VLDL: 12 mg/dL (ref <30)

## 2015-12-19 LAB — HEPATIC FUNCTION PANEL
ALT: 12 U/L (ref 6–29)
AST: 19 U/L (ref 10–35)
Albumin: 3.9 g/dL (ref 3.6–5.1)
Alkaline Phosphatase: 54 U/L (ref 33–130)
Bilirubin, Direct: 0.1 mg/dL (ref <=0.2)
Indirect Bilirubin: 0.5 mg/dL (ref 0.2–1.2)
Total Bilirubin: 0.6 mg/dL (ref 0.2–1.2)
Total Protein: 6.1 g/dL (ref 6.1–8.1)

## 2015-12-19 LAB — HEMOGLOBIN A1C
Hgb A1c MFr Bld: 6.2 % — ABNORMAL HIGH (ref <5.7)
Mean Plasma Glucose: 131 mg/dL — ABNORMAL HIGH (ref <117)

## 2015-12-19 NOTE — Progress Notes (Signed)
Patient ID: Briana [name], female   DOB: 1934/01/01, 82 y.o.   MRN: [id number]  MEDICARE ANNUAL WELLNESS VISIT AND FOLLOW UP  Assessment:    1. Essential hypertension -DASH diet -exercise -cont meds -monitor at home - TSH  2. Prediabetes -diet and exercise - Hemoglobin A1c  3. Hyperlipidemia -cont meds - Lipid panel  4. Vitamin D deficiency -cont supplement  5. Medication management  - CBC with Differential/Platelet - BASIC METABOLIC PANEL WITH GFR - Hepatic function panel  6. GERD -diet and exercise -avoid trigger foods  7. Osteoporosis -cont impact exercise  8. Diffuse cystic mastopathy, unspecified laterality -cont mammaograms  9. Lumbar stenosis -diet and exercise -tylenol prn for pain medication  10. BMI 28.63,  adult   11. Medicare annual wellness visit, subsequent -due next year     Over 30 minutes of exam, counseling, chart review, and critical decision making was performed  Plan:   During the course of the visit the patient was educated and counseled about appropriate screening and preventive services including:    Pneumococcal vaccine   Influenza vaccine  Td vaccine  Prevnar 13  Screening electrocardiogram  Screening mammography  Bone densitometry screening  Colorectal cancer screening  Diabetes screening  Glaucoma screening  Nutrition counseling   Advanced directives: given info/requested copies  Conditions/risks identified: Diabetes is at goal, ACE/ARB therapy: No, Reason not on Ace Inhibitor/ARB therapy:  no Urinary Incontinence is not an issue: discussed non pharmacology and pharmacology options.  Fall risk: low- discussed PT, home fall assessment, medications.    Subjective:   Briana [name] is a 82 y.o. female who presents for Medicare Annual Wellness Visit and 3 month follow up on hypertension, prediabetes, hyperlipidemia, vitamin D def.  Date of last medicare wellness visit was last year.   Her blood  pressure has not been controlled at home, today their BP is BP: (!) 154/76 mmHg She does workout. She denies chest pain, shortness of breath, dizziness. She does water aerobics and also does some walking.  She gets in 5 days a week.    She is on cholesterol medication and denies myalgias. Her cholesterol is at goal. The cholesterol last visit was:   Lab Results  Component Value Date   CHOL 176 08/28/2015   HDL 63 08/28/2015   LDLCALC 92 08/28/2015   TRIG 104 08/28/2015   CHOLHDL 2.8 08/28/2015   She has been working on diet and exercise for prediabetes, and denies foot ulcerations, hyperglycemia, hypoglycemia , increased appetite, nausea, paresthesia of the feet, polydipsia, polyuria, visual disturbances, vomiting and weight loss. Last A1C in the office was:  Lab Results  Component Value Date   HGBA1C 6.3* 08/28/2015   Last GFR NonAA   Lab Results  Component Value Date   [facility] 62 08/28/2015   AA  Lab Results  Component Value Date   GFRAA 71 08/28/2015   Patient is on Vitamin D supplement. Lab Results  Component Value Date   VD25OH 71 08/28/2015     She occasionally has some coughing.  She reports that it has not been anything consistent.    Medication Review Current Outpatient Prescriptions on File Prior to Visit  Medication Sig Dispense Refill  . acetaminophen (TYLENOL) 650 MG CR tablet Take 650 mg by mouth every 8 (eight) hours as needed for pain.    Marland Kitchen albuterol (PROVENTIL HFA;VENTOLIN HFA) 108 (90 BASE) MCG/ACT inhaler Inhale 2 puffs into the lungs every 6 (six) hours as needed for wheezing or shortness  of breath. 1 Inhaler 2  . Ascorbic Acid (VITAMIN C) 1000 MG tablet Take 1,000 mg by mouth 2 (two) times daily.    Marland Kitchen aspirin 81 MG tablet Take 81 mg by mouth daily.    Marland Kitchen b complex vitamins tablet Take 1 tablet by mouth daily.    . bumetanide (BUMEX) 1 MG tablet TAKE ONE-HALF TO ONE TABLET BY MOUTH TWICE DAILY AS NEEDED FOR SWELLING 90 tablet 0  . Calcium Carbonate  (CALCIUM 600 PO) Take 600 mg by mouth every evening.    . Cholecalciferol (VITAMIN D3) 2000 UNITS TABS Take 2,000-4,000 Units by mouth 2 (two) times daily. Take 4000 units in the morning and 2000 units in the evening    . MELATONIN PO Take by mouth. Takes 1 at bedtime    . Misc Natural Products (OSTEO BI-FLEX JOINT SHIELD PO) Take 1 tablet by mouth 2 (two) times daily.     . propranolol ER (INDERAL LA) 80 MG 24 hr capsule TAKE ONE CAPSULE BY MOUTH ONCE DAILY FOR  TREMORS. 90 capsule 3  . ranitidine (ZANTAC) 300 MG tablet TAKE ONE TABLET BY MOUTH TWICE DAILY FOR  ACID  REFLUX 180 tablet 1  . tamoxifen (NOLVADEX) 20 MG tablet TAKE ONE TABLET BY MOUTH ONCE DAILY 90 tablet 1  . vitamin E 400 UNIT capsule Take 400 Units by mouth 2 (two) times daily.     No current facility-administered medications on file prior to visit.    Current Problems (verified) Patient Active Problem List   Diagnosis Date Noted  . BMI 28.63,  adult 09/05/2015  . Lumbar stenosis 06/05/2015  . Medication management 06/13/2014  . Fracture of right radius 01/10/2014  . GERD 11/20/2013  . Osteoporosis 11/20/2013  . Essential hypertension 11/19/2013  . Hyperlipidemia 11/19/2013  . Prediabetes 11/19/2013  . Vitamin D deficiency 11/19/2013  . Diffuse cystic mastopathy 11/19/2013    Screening Tests Immunization History  Administered Date(s) Administered  . DT 09/05/2015  . DTaP 11/16/2004  . Influenza Whole 08/30/2013  . Influenza, High Dose Seasonal PF 08/28/2015  . Pneumococcal Polysaccharide-23 11/16/2001  . Zoster 06/01/2013    Preventative care: Last colonoscopy: 2011 Last mammogram: 07/31/15   DEXA: ordered today  Prior vaccinations: TD or Tdap: 2017  Influenza: 2016  Pneumococcal: 2003 Prevnar13:  Shingles/Zostavax: 2014  Names of Other Physician/Practitioners you currently use: 1. [facility]- here for primary care 2. [employer], eye doctor, last visit 2016 3.  Dr. Eliezer Bottom, dentist, last visit 2016 Patient Care Team: Unk Pinto, MD as PCP - General (Internal Medicine) Iran Planas, MD as Consulting Physician (Orthopedic Surgery) Ladene Artist, MD as Consulting Physician (Gastroenterology) Suella Broad, MD as Consulting Physician (Physical Medicine and Rehabilitation) Rolm Bookbinder, MD as Consulting Physician (Dermatology)  Past Surgical History  Procedure Laterality Date  . Thyroid surgery    . Varicose veins stripped    . Tonsillectomy    . Appendectomy    . Tubal ligation    . Open reduction internal fixation (orif) distal radial fracture Right 01/10/2014    Procedure: OPEN REDUCTION INTERNAL FIXATION (ORIF) DISTAL RADIAL FRACTURE;  Surgeon: Linna Hoff, MD;  Location: [place];  Service: Orthopedics;  Laterality: Right;  . Colonoscopy    . Esophagogastroduodenoscopy    . Breast surgery Right     fluid drained from breast  . Joint replacement Left     left hip  . Spinal cord decompression  06/05/2015    L3 L 5  .  Lumbar laminectomy/decompression microdiscectomy N/A 06/05/2015    Procedure: L3-L5 DECOMPRESSION ;  Surgeon: Melina Schools, MD;  Location: [place];  Service: Orthopedics;  Laterality: N/A;   Family History  Problem Relation Age of Onset  . Hypertension Mother   . Hyperlipidemia Mother   . Heart disease Sister   . Cancer Brother     prostate   Social History  Substance Use Topics  . Smoking status: Never Smoker   . Smokeless tobacco: Never Used  . Alcohol Use: No    MEDICARE WELLNESS OBJECTIVES: Tobacco use: She does not smoke.  Patient is not a former smoker. If yes, counseling given Alcohol Current alcohol use: glass of wine with dinner Osteoporosis: postmenopausal estrogen deficiency, History of fracture in the past year: no Fall risk: Low Risk Hearing: normal Visual acuity: normal,  does perform annual eye exam Diet: well balanced Physical activity: Current Exercise Habits:: Home exercise  routine;Structured exercise class, Type of exercise: walking;Other - see comments (water aerobics), Time (Minutes): 60, Frequency (Times/Week): 5, Weekly Exercise (Minutes/Week): 300, Intensity: Moderate Cardiac risk factors: Cardiac Risk Factors include: advanced age (>32men, >37 women);dyslipidemia;hypertension Depression/mood screen:   Depression screen [facility] 2/9 12/19/2015  Decreased Interest 0  Down, Depressed, Hopeless 0  PHQ - 2 Score 0    ADLs:  In your present state of health, do you have any difficulty performing the following activities: 12/19/2015 09/05/2015  Hearing? N N  Vision? N N  Difficulty concentrating or making decisions? N N  Walking or climbing stairs? N N  Dressing or bathing? N N  Doing errands, shopping? N N  Preparing Food and eating ? N -  Using the Toilet? N -  In the past six months, have you accidently leaked urine? N -  Do you have problems with loss of bowel control? N -  Managing your Medications? N -  Managing your Finances? N -  Housekeeping or managing your Housekeeping? N -     Cognitive Testing  Alert? Yes  Normal Appearance?Yes  Oriented to person? Yes  Place? Yes   Time? Yes  Recall of three objects?  Yes  Can perform simple calculations? Yes  Displays appropriate judgment?Yes  Can read the correct time from a watch face?Yes  EOL planning: Does patient have an advance directive?: No Would patient like information on creating an advanced directive?: Yes - Educational materials given   Objective:   Today's Vitals   12/19/15 0927  BP: 154/76  Pulse: 62  Temp: 98.2 F (36.8 C)  TempSrc: Temporal  Resp: 16  Height: 5' 1.5" (1.562 m)  Weight: 154 lb (69.854 kg)   Body mass index is 28.63 kg/(m^2).  General appearance: alert, no distress, WD/WN,  female HEENT: normocephalic, sclerae anicteric, TMs pearly, nares patent, no discharge or erythema, pharynx normal Oral cavity: MMM, no lesions Neck: supple, no lymphadenopathy, no  thyromegaly, no masses Heart: RRR, normal S1, S2, no murmurs Lungs: CTA bilaterally, no wheezes, rhonchi, or rales Abdomen: +bs, soft, non tender, non distended, no masses, no hepatomegaly, no splenomegaly Musculoskeletal: nontender, no swelling, no obvious deformity Extremities: no edema, no cyanosis, no clubbing Pulses: 2+ symmetric, upper and lower extremities, normal cap refill Neurological: alert, oriented x 3, CN2-12 intact, strength normal upper extremities and lower extremities, sensation normal throughout, DTRs 2+ throughout, no cerebellar signs, gait normal Psychiatric: normal affect, behavior normal, pleasant  Breast: defer Gyn: defer Rectal: defer   Medicare Attestation I have personally reviewed: The patient's medical and social history Their  use of alcohol, tobacco or illicit drugs Their current medications and supplements The patient's functional ability including ADLs,fall risks, home safety risks, cognitive, and hearing and visual impairment Diet and physical activities Evidence for depression or mood disorders  The patient's weight, height, BMI, and visual acuity have been recorded in the chart.  I have made referrals, counseling, and provided education to the patient based on review of the above and I have provided the patient with a written personalized care plan for preventive services.     Starlyn Skeans, PA-C   12/19/2015

## 2016-01-03 ENCOUNTER — Encounter: Payer: Self-pay | Admitting: *Deleted

## 2016-01-16 ENCOUNTER — Other Ambulatory Visit: Payer: Self-pay | Admitting: Internal Medicine

## 2016-03-19 ENCOUNTER — Encounter: Payer: Self-pay | Admitting: Internal Medicine

## 2016-03-19 ENCOUNTER — Ambulatory Visit (INDEPENDENT_AMBULATORY_CARE_PROVIDER_SITE_OTHER): Payer: Medicare Other | Admitting: Internal Medicine

## 2016-03-19 VITALS — BP 124/82 | HR 64 | Temp 97.6°F | Resp 16 | Ht 61.25 in | Wt 157.8 lb

## 2016-03-19 DIAGNOSIS — R7309 Other abnormal glucose: Secondary | ICD-10-CM | POA: Diagnosis not present

## 2016-03-19 DIAGNOSIS — E559 Vitamin D deficiency, unspecified: Secondary | ICD-10-CM | POA: Diagnosis not present

## 2016-03-19 DIAGNOSIS — K2100 Gastro-esophageal reflux disease with esophagitis, without bleeding: Secondary | ICD-10-CM

## 2016-03-19 DIAGNOSIS — Z79899 Other long term (current) drug therapy: Secondary | ICD-10-CM | POA: Diagnosis not present

## 2016-03-19 DIAGNOSIS — R7303 Prediabetes: Secondary | ICD-10-CM

## 2016-03-19 DIAGNOSIS — I10 Essential (primary) hypertension: Secondary | ICD-10-CM

## 2016-03-19 DIAGNOSIS — E782 Mixed hyperlipidemia: Secondary | ICD-10-CM | POA: Diagnosis not present

## 2016-03-19 LAB — HEPATIC FUNCTION PANEL
ALT: 12 U/L (ref 6–29)
AST: 20 U/L (ref 10–35)
Albumin: 4.1 g/dL (ref 3.6–5.1)
Alkaline Phosphatase: 54 U/L (ref 33–130)
Bilirubin, Direct: 0.1 mg/dL (ref <=0.2)
Indirect Bilirubin: 0.5 mg/dL (ref 0.2–1.2)
Total Bilirubin: 0.6 mg/dL (ref 0.2–1.2)
Total Protein: 6.2 g/dL (ref 6.1–8.1)

## 2016-03-19 LAB — CBC WITH DIFFERENTIAL/PLATELET
Basophils Absolute: 0 cells/uL (ref 0–200)
Basophils Relative: 0 %
Eosinophils Absolute: 204 cells/uL (ref 15–500)
Eosinophils Relative: 3 %
HCT: 41.5 % (ref 35.0–45.0)
Hemoglobin: 13.8 g/dL (ref 11.7–15.5)
Lymphocytes Relative: 19 %
Lymphs Abs: 1292 cells/uL (ref 850–3900)
MCH: 30.1 pg (ref 27.0–33.0)
MCHC: 33.3 g/dL (ref 32.0–36.0)
MCV: 90.4 fL (ref 80.0–100.0)
MPV: 10.5 fL (ref 7.5–12.5)
Monocytes Absolute: 816 cells/uL (ref 200–950)
Monocytes Relative: 12 %
Neutro Abs: 4488 cells/uL (ref 1500–7800)
Neutrophils Relative %: 66 %
Platelets: 197 10*3/uL (ref 140–400)
RBC: 4.59 MIL/uL (ref 3.80–5.10)
RDW: 14.6 % (ref 11.0–15.0)
WBC: 6.8 10*3/uL (ref 3.8–10.8)

## 2016-03-19 LAB — BASIC METABOLIC PANEL WITH GFR
BUN: 22 mg/dL (ref 7–25)
CO2: 29 mmol/L (ref 20–31)
Calcium: 9.6 mg/dL (ref 8.6–10.4)
Chloride: 103 mmol/L (ref 98–110)
Creat: 0.87 mg/dL (ref 0.60–0.88)
GFR, Est African American: 73 mL/min (ref >=60)
GFR, Est Non African American: 63 mL/min (ref >=60)
Glucose, Bld: 102 mg/dL — ABNORMAL HIGH (ref 65–99)
Potassium: 4.2 mmol/L (ref 3.5–5.3)
Sodium: 140 mmol/L (ref 135–146)

## 2016-03-19 LAB — LIPID PANEL
Cholesterol: 168 mg/dL (ref 125–200)
HDL: 64 mg/dL (ref >=46)
LDL Cholesterol: 88 mg/dL (ref <130)
Total CHOL/HDL Ratio: 2.6 Ratio (ref <=5.0)
Triglycerides: 79 mg/dL (ref <150)
VLDL: 16 mg/dL (ref <30)

## 2016-03-19 LAB — MAGNESIUM: Magnesium: 2.1 mg/dL (ref 1.5–2.5)

## 2016-03-19 LAB — TSH: TSH: 0.94 mIU/L

## 2016-03-19 NOTE — Progress Notes (Signed)
Patient ID: Briana [name], female   DOB: [birth year], 82 y.o.   MRN: [id number] [facility] ADULT &  ADOLESCENT INTERNAL MEDICINE                   Unk Pinto, M.D.    [name]. Silverio Lay, P.A.-C      Starlyn Skeans, P.A.-C  [facility]                [address] [place], [place] [id number] Telephone [phone] Telefax [phone] _________________________________   This very nice 82 y.o. [facility] presents for 6 month follow up with Hypertension, Hyperlipidemia, Pre-Diabetes and Vitamin D Deficiency. Patient also is c/o L knee pains followed At Dr Alusio's office.   Patient is treated for HTN since 1996  & BP has been controlled at home. Today's BP: 124/82 mmHg. Patient has had no complaints of any cardiac type chest pain, palpitations, dyspnea/orthopnea/PND, dizziness, claudication, or dependent edema.   Hyperlipidemia is controlled with diet & meds. Patient denies myalgias or other med SE's. Last Lipids were near goal with  Cholesterol 180; HDL 65; LDL 103; Triglycerides 60 on 12/19/2015.   Also, the patient has history of PreDiabetes circa 2011 with A1c 6.6%  and has had no symptoms of reactive hypoglycemia, diabetic polys, paresthesias or visual blurring.  Last A1c was  6.2% on 12/19/2015.   Further, the patient also has history of Vitamin D Deficiency of "23" in 2008 and supplements vitamin D without any suspected side-effects. Last vitamin D was  71 on 08/28/2015.  Medication Sig  . acetaminophen  650 MG CR tablet Take 650 mg by mouth every 8 (eight) hours as needed for pain.  Marland Kitchen albuterol  HFA  inhaler Inhale 2 puffs into the lungs every 6 (six) hours as needed for wheezing or shortness of breath.  Marland Kitchen VITAMIN C)1000 MG tab Take 1,000 mg by mouth 2 (two) times daily.  Marland Kitchen aspirin 81 MG tablet Take 81 mg by mouth daily.  Marland Kitchen b complex vitamins tab Take 1 tablet by mouth daily.  . bumetanide  1 MG tablet TAKE ONE-HALF TO ONE TABLET BY MOUTH TWICE  DAILY AS NEEDED FOR SWELLING  . Calcium Carbonate  600  Take 600 mg by mouth every evening.  Marland Kitchen VITAMIN D 2000 UNITS  Take 2,000-4,000 Units by mouth 2 (two) times daily. Take 4000 units in the morning and 2000 units in the evening  . MELATONIN PO Take by mouth. Takes 1 at bedtime  . OSTEO BI-FLEX JOINT SHIELD  Take 1 tablet by mouth 2 (two) times daily.   . propranolol ER  80 MG  TAKE ONE CAPSULE BY MOUTH ONCE DAILY FOR  TREMORS.  . ranitidine (ZANTAC) 300 MG tablet TAKE ONE TABLET BY MOUTH TWICE DAILY FOR  ACID  REFLUX  . tamoxifen (NOLVADEX) 20 MG tablet TAKE ONE TABLET BY MOUTH ONCE DAILY  . vitamin E 400 UNIT capsule Take 400 Units by mouth 2 (two) times daily.   Allergies  Allergen Reactions  . Accupril [Quinapril Hcl] Cough  . Neosporin [Neomycin-Bacitracin Zn-Polymyx]   . Prednisone     Agitated and shaky  . Reglan [Metoclopramide] Other (See Comments)    tremors  . Tramadol Nausea And Vomiting  . Adhesive [Tape] Rash    PMHx:   Past Medical History  Diagnosis Date  . Diverticulitis   . Hyperlipidemia   .  Pre-diabetes   . Vitamin D deficiency   . Osteoporosis   . Varicose veins   . Hx of irritable bowel syndrome   . Occasional tremors   . Heart murmur   . Diabetes mellitus without complication ([place])     "pre-diabetes"  . History of pneumonia   . GERD (gastroesophageal reflux disease)     takes ranitidine  . DJD (degenerative joint disease)   . DDD (degenerative disc disease)   . Cancer ([place])     skin cancer  . History of hiatal hernia    Immunization History  Administered Date(s) Administered  . DT 09/05/2015  . DTaP 11/16/2004  . Influenza Whole 08/30/2013  . Influenza, High Dose Seasonal PF 08/28/2015  . Pneumococcal Polysaccharide-23 11/16/2001  . Zoster 06/01/2013   Past Surgical History  Procedure Laterality Date  . Thyroid surgery    . Varicose veins stripped    . Tonsillectomy    . Appendectomy    . Tubal ligation    . Open reduction internal  fixation (orif) distal radial fracture Right 01/10/2014  -  Surgeon: Linna Hoff, MD  . Colonoscopy    . Esophagogastroduodenoscopy    . Breast surgeryfluid drained from breast Right     left hip THR  - Dr Synthia Innocent  - 04/2010  . L3-L5 DECOMPRESSION / Lumbar laminectomy/microdiscectomy  07/20/2016Surgeon: Melina Schools, MD 2016   FHx:    Reviewed / unchanged  SHx:    Reviewed / unchanged  Systems Review:  Constitutional: Denies fever, chills, wt changes, headaches, insomnia, fatigue, night sweats, change in appetite. Eyes: Denies redness, blurred vision, diplopia, discharge, itchy, watery eyes.  ENT: Denies discharge, congestion, post nasal drip, epistaxis, sore throat, earache, hearing loss, dental pain, tinnitus, vertigo, sinus pain, snoring.  CV: Denies chest pain, palpitations, irregular heartbeat, syncope, dyspnea, diaphoresis, orthopnea, PND, claudication or edema. Respiratory: denies cough, dyspnea, DOE, pleurisy, hoarseness, laryngitis, wheezing.  Gastrointestinal: Denies dysphagia, odynophagia, heartburn, reflux, water brash, abdominal pain or cramps, nausea, vomiting, bloating, diarrhea, constipation, hematemesis, melena, hematochezia  or hemorrhoids. Genitourinary: Denies dysuria, frequency, urgency, nocturia, hesitancy, discharge, hematuria or flank pain. Musculoskeletal: Denies arthralgias, myalgias, stiffness, jt. swelling, pain, limping or strain/sprain.  Skin: Denies pruritus, rash, hives, warts, acne, eczema or change in skin lesion(s). Neuro: No weakness, tremor, incoordination, spasms, paresthesia or pain. Psychiatric: Denies confusion, memory loss or sensory loss. Endo: Denies change in weight, skin or hair change.  Heme/Lymph: No excessive bleeding, bruising or enlarged lymph nodes.  Physical Exam  BP 124/82 mmHg  Pulse 64  Temp(Src) 97.6 F (36.4 C)  Resp 16  Ht 5' 1.25" (1.556 m)  Wt 157 lb 12.8 oz (71.578 kg)  BMI 29.56 kg/m2  Appears well nourished and in  no distress.  Eyes: PERRLA, EOMs, conjunctiva no swelling or erythema. Sinuses: No frontal/maxillary tenderness ENT/Mouth: EAC's clear, TM's nl w/o erythema, bulging. Nares clear w/o erythema, swelling, exudates. Oropharynx clear without erythema or exudates. Oral hygiene is good. Tongue normal, non obstructing. Hearing intact.  Neck: Supple. Thyroid nl. Car 2+/2+ without bruits, nodes or JVD. Chest: Respirations nl with BS clear & equal w/o rales, rhonchi, wheezing or stridor.  Cor: Heart sounds normal w/ regular rate and rhythm without sig. murmurs, gallops, clicks, or rubs. Peripheral pulses normal and equal  without edema.  Abdomen: Soft & bowel sounds normal. Non-tender w/o guarding, rebound, hernias, masses, or organomegaly.  Lymphatics: Unremarkable.  Musculoskeletal: Full ROM all peripheral extremities, joint stability, 5/5 strength, and normal gait.  Skin:  Warm, dry without exposed rashes, lesions or ecchymosis apparent.  Neuro: Cranial nerves intact, reflexes equal bilaterally. Sensory-motor testing grossly intact. Tendon reflexes grossly intact.  Pysch: Alert & oriented x 3.  Insight and judgement nl & appropriate. No ideations.  Assessment and Plan:  1. Essential hypertension  - TSH  2. Hyperlipidemia  - Lipid panel - TSH  3. Prediabetes  - Hemoglobin A1c - Insulin, random  4. Vitamin D deficiency  - VITAMIN D 25 Hydroxy   5. GERD   6. Medication management  - CBC with Differential/Platelet - BASIC METABOLIC PANEL WITH GFR - Hepatic function panel - Magnesium   Recommended regular exercise, BP monitoring, weight control, and discussed med and SE's. Recommended labs to assess and monitor clinical status. Further disposition pending results of labs. Over 30 minutes of exam, counseling, chart review was performed

## 2016-03-19 NOTE — Patient Instructions (Signed)

## 2016-03-20 LAB — INSULIN, RANDOM: Insulin: 4.3 u[IU]/mL (ref 2.0–19.6)

## 2016-03-20 LAB — HEMOGLOBIN A1C
Hgb A1c MFr Bld: 6.3 % — ABNORMAL HIGH (ref <5.7)
Mean Plasma Glucose: 134 mg/dL

## 2016-03-20 LAB — VITAMIN D 25 HYDROXY (VIT D DEFICIENCY, FRACTURES): Vit D, 25-Hydroxy: 66 ng/mL (ref 30–100)

## 2016-04-09 ENCOUNTER — Other Ambulatory Visit: Payer: Self-pay | Admitting: Internal Medicine

## 2016-04-16 DIAGNOSIS — D485 Neoplasm of uncertain behavior of skin: Secondary | ICD-10-CM | POA: Diagnosis not present

## 2016-04-16 DIAGNOSIS — Z85828 Personal history of other malignant neoplasm of skin: Secondary | ICD-10-CM | POA: Diagnosis not present

## 2016-04-16 DIAGNOSIS — L570 Actinic keratosis: Secondary | ICD-10-CM | POA: Diagnosis not present

## 2016-04-16 DIAGNOSIS — L820 Inflamed seborrheic keratosis: Secondary | ICD-10-CM | POA: Diagnosis not present

## 2016-04-16 DIAGNOSIS — L308 Other specified dermatitis: Secondary | ICD-10-CM | POA: Diagnosis not present

## 2016-05-05 ENCOUNTER — Other Ambulatory Visit: Payer: Self-pay | Admitting: Internal Medicine

## 2016-06-12 ENCOUNTER — Other Ambulatory Visit: Payer: Self-pay | Admitting: Internal Medicine

## 2016-06-25 ENCOUNTER — Ambulatory Visit (HOSPITAL_COMMUNITY)
Admission: RE | Admit: 2016-06-25 | Discharge: 2016-06-25 | Disposition: A | Discharge status: Home / Self Care | Payer: Medicare Other | Source: Ambulatory Visit | Attending: Physician Assistant | Admitting: Physician Assistant

## 2016-06-25 ENCOUNTER — Encounter: Payer: Self-pay | Admitting: Physician Assistant

## 2016-06-25 ENCOUNTER — Ambulatory Visit (INDEPENDENT_AMBULATORY_CARE_PROVIDER_SITE_OTHER): Payer: Medicare Other | Admitting: Physician Assistant

## 2016-06-25 VITALS — BP 132/76 | HR 64 | Temp 97.9°F | Resp 14 | Ht 61.0 in | Wt 162.0 lb

## 2016-06-25 DIAGNOSIS — R0600 Dyspnea, unspecified: Secondary | ICD-10-CM | POA: Diagnosis not present

## 2016-06-25 DIAGNOSIS — Z79899 Other long term (current) drug therapy: Secondary | ICD-10-CM | POA: Diagnosis not present

## 2016-06-25 DIAGNOSIS — R05 Cough: Secondary | ICD-10-CM | POA: Insufficient documentation

## 2016-06-25 DIAGNOSIS — R7303 Prediabetes: Secondary | ICD-10-CM

## 2016-06-25 DIAGNOSIS — E782 Mixed hyperlipidemia: Secondary | ICD-10-CM | POA: Diagnosis not present

## 2016-06-25 DIAGNOSIS — I10 Essential (primary) hypertension: Secondary | ICD-10-CM

## 2016-06-25 DIAGNOSIS — E559 Vitamin D deficiency, unspecified: Secondary | ICD-10-CM

## 2016-06-25 LAB — CBC WITH DIFFERENTIAL/PLATELET
Basophils Absolute: 0 cells/uL (ref 0–200)
Basophils Relative: 0 %
Eosinophils Absolute: 171 cells/uL (ref 15–500)
Eosinophils Relative: 3 %
HCT: 39.8 % (ref 35.0–45.0)
Hemoglobin: 13 g/dL (ref 11.7–15.5)
Lymphocytes Relative: 21 %
Lymphs Abs: 1197 cells/uL (ref 850–3900)
MCH: 29.9 pg (ref 27.0–33.0)
MCHC: 32.7 g/dL (ref 32.0–36.0)
MCV: 91.5 fL (ref 80.0–100.0)
MPV: 11 fL (ref 7.5–12.5)
Monocytes Absolute: 741 cells/uL (ref 200–950)
Monocytes Relative: 13 %
Neutro Abs: 3591 cells/uL (ref 1500–7800)
Neutrophils Relative %: 63 %
Platelets: 187 10*3/uL (ref 140–400)
RBC: 4.35 MIL/uL (ref 3.80–5.10)
RDW: 14.3 % (ref 11.0–15.0)
WBC: 5.7 10*3/uL (ref 3.8–10.8)

## 2016-06-25 LAB — LIPID PANEL
Cholesterol: 161 mg/dL (ref 125–200)
HDL: 60 mg/dL (ref >=46)
LDL Cholesterol: 86 mg/dL (ref <130)
Total CHOL/HDL Ratio: 2.7 Ratio (ref <=5.0)
Triglycerides: 74 mg/dL (ref <150)
VLDL: 15 mg/dL (ref <30)

## 2016-06-25 LAB — BASIC METABOLIC PANEL WITH GFR
BUN: 16 mg/dL (ref 7–25)
CO2: 26 mmol/L (ref 20–31)
Calcium: 9.4 mg/dL (ref 8.6–10.4)
Chloride: 105 mmol/L (ref 98–110)
Creat: 0.9 mg/dL — ABNORMAL HIGH (ref 0.60–0.88)
GFR, Est African American: 70 mL/min (ref >=60)
GFR, Est Non African American: 61 mL/min (ref >=60)
Glucose, Bld: 145 mg/dL — ABNORMAL HIGH (ref 65–99)
Potassium: 4.3 mmol/L (ref 3.5–5.3)
Sodium: 141 mmol/L (ref 135–146)

## 2016-06-25 LAB — HEPATIC FUNCTION PANEL
ALT: 12 U/L (ref 6–29)
AST: 19 U/L (ref 10–35)
Albumin: 3.9 g/dL (ref 3.6–5.1)
Alkaline Phosphatase: 53 U/L (ref 33–130)
Bilirubin, Direct: 0.1 mg/dL (ref <=0.2)
Indirect Bilirubin: 0.5 mg/dL (ref 0.2–1.2)
Total Bilirubin: 0.6 mg/dL (ref 0.2–1.2)
Total Protein: 5.9 g/dL — ABNORMAL LOW (ref 6.1–8.1)

## 2016-06-25 LAB — TSH: TSH: 0.8 mIU/L

## 2016-06-25 LAB — MAGNESIUM: Magnesium: 1.9 mg/dL (ref 1.5–2.5)

## 2016-06-25 NOTE — Progress Notes (Signed)
Assessment and Plan:   Hypertension -Continue medication, monitor blood pressure at home. Continue DASH diet.  Reminder to go to the ER if any CP, SOB, nausea, dizziness, severe HA, changes vision/speech, left arm numbness and tingling and jaw pain.  Cholesterol -Continue diet and exercise. Check cholesterol.    Prediabetes  -Continue diet and exercise. Check A1C  Vitamin D Def - check level and continue medications.   Dyspnea No accompaniments, continue bASA, medical therapy, declines referral at this time, check labs If any changes/accompaniments will go to ER, patient understands.   Continue diet and meds as discussed. Further disposition pending results of labs. Over 30 minutes of exam, counseling, chart review, and critical decision making was performed  Future Appointments Date Time Provider [place]  06/25/2016 9:45 AM Vicie Mutters, PA-C [facility] None  10/19/2016 10:00 AM Unk Pinto, MD [facility] None     HPI 82 y.o. female  presents for 3 month follow up on hypertension, cholesterol, prediabetes, and vitamin D deficiency.  Her blood pressure has been controlled at home, today their BP is BP: 132/76  She does workout. She denies chest pain, dizziness. She has been walking at [employer], will get some SOB and fatigue after 20-30 mins, no accompaniments, she will rest for 10 mins then she can start to walk again. She is on 81mg  ASA, she had normal CXR 2015. She has a cough but denies any wheezing.   She is not on cholesterol medication and denies myalgias. Her cholesterol is at goal. The cholesterol last visit was:   Lab Results  Component Value Date   CHOL 168 03/19/2016   HDL 64 03/19/2016   LDLCALC 88 03/19/2016   TRIG 79 03/19/2016   CHOLHDL 2.6 03/19/2016    She has been working on diet and exercise for prediabetes, and denies paresthesia of the feet, polydipsia, polyuria and visual disturbances. Last A1C in the office was:  Lab Results  Component  Value Date   HGBA1C 6.3 (H) 03/19/2016   Patient is on Vitamin D supplement.   Lab Results  Component Value Date   VD25OH 66 03/19/2016       Current Medications:  Current Outpatient Prescriptions on File Prior to Visit  Medication Sig Dispense Refill  . acetaminophen (TYLENOL) 650 MG CR tablet Take 650 mg by mouth every 8 (eight) hours as needed for pain.    Marland Kitchen albuterol (PROVENTIL HFA;VENTOLIN HFA) 108 (90 BASE) MCG/ACT inhaler Inhale 2 puffs into the lungs every 6 (six) hours as needed for wheezing or shortness of breath. 1 Inhaler 2  . Ascorbic Acid (VITAMIN C) 1000 MG tablet Take 1,000 mg by mouth 2 (two) times daily.    Marland Kitchen aspirin 81 MG tablet Take 81 mg by mouth daily.    Marland Kitchen b complex vitamins tablet Take 1 tablet by mouth daily.    . bumetanide (BUMEX) 1 MG tablet TAKE ONE-HALF TO ONE TABLET BY MOUTH TWICE DAILY AS NEEDED FOR SWELLING 90 tablet 1  . Calcium Carbonate (CALCIUM 600 PO) Take 600 mg by mouth every evening.    . Cholecalciferol (VITAMIN D3) 2000 UNITS TABS Take 2,000-4,000 Units by mouth 2 (two) times daily. Take 4000 units in the morning and 2000 units in the evening    . MELATONIN PO Take by mouth. Takes 1 at bedtime    . Misc Natural Products (OSTEO BI-FLEX JOINT SHIELD PO) Take 1 tablet by mouth 2 (two) times daily.     . propranolol ER (INDERAL LA)  80 MG 24 hr capsule TAKE ONE CAPSULE BY MOUTH ONCE DAILY FOR  TREMORS 90 capsule 1  . ranitidine (ZANTAC) 300 MG tablet TAKE ONE TABLET BY MOUTH TWICE DAILY FOR ACID REFLUX 180 tablet 0  . tamoxifen (NOLVADEX) 20 MG tablet TAKE ONE TABLET BY MOUTH ONCE DAILY 90 tablet 1   No current facility-administered medications on file prior to visit.    Medical History:  Past Medical History:  Diagnosis Date  . Cancer ([place])    skin cancer  . DDD (degenerative disc disease)   . Diabetes mellitus without complication ([place])    "pre-diabetes"  . Diverticulitis   . DJD (degenerative joint disease)   . GERD (gastroesophageal  reflux disease)    takes ranitidine  . Heart murmur   . History of hiatal hernia   . History of pneumonia   . Hx of irritable bowel syndrome   . Hyperlipidemia   . Occasional tremors   . Osteoporosis   . Pre-diabetes   . Varicose veins   . Vitamin D deficiency    Allergies:  Allergies  Allergen Reactions  . Accupril [Quinapril Hcl] Cough  . Neosporin [Neomycin-Bacitracin Zn-Polymyx]   . Prednisone     Agitated and shaky  . Reglan [Metoclopramide] Other (See Comments)    tremors  . Tramadol Nausea And Vomiting  . Adhesive [Tape] Rash    Review of Systems:  Review of Systems  Constitutional: Negative.   HENT: Negative.   Eyes: Negative.   Respiratory: Positive for cough and shortness of breath. Negative for hemoptysis, sputum production and wheezing.   Cardiovascular: Negative.   Gastrointestinal: Negative.   Genitourinary: Negative.   Musculoskeletal: Negative.   Skin: Negative.   Neurological: Negative.   Endo/Heme/Allergies: Negative.   Psychiatric/Behavioral: Negative.     Family history- Review and unchanged Social history- Review and unchanged Physical Exam: BP 132/76   Pulse 64   Temp 97.9 F (36.6 C)   Resp 14   Ht 5\' 1"  (1.549 m)   Wt 162 lb (73.5 kg)   SpO2 96%   BMI 30.61 kg/m  Wt Readings from Last 3 Encounters:  06/25/16 162 lb (73.5 kg)  03/19/16 157 lb 12.8 oz (71.6 kg)  12/19/15 154 lb (69.9 kg)   General Appearance: Well nourished, in no apparent distress. Eyes: PERRLA, EOMs, conjunctiva no swelling or erythema Sinuses: No Frontal/maxillary tenderness ENT/Mouth: Ext aud canals clear, TMs without erythema, bulging. No erythema, swelling, or exudate on post pharynx.  Tonsils not swollen or erythematous. Hearing normal.  Neck: Supple, thyroid normal.  Respiratory: Respiratory effort normal, BS equal bilaterally without rales, rhonchi, wheezing or stridor.  Cardio: RRR with no MRGs. Brisk peripheral pulses without edema.  Abdomen: Soft, +  BS,  Non tender, no guarding, rebound, hernias, masses. Lymphatics: Non tender without lymphadenopathy.  Musculoskeletal: Full ROM, 5/5 strength, Normal gait Skin: Warm, dry without rashes, lesions, ecchymosis.  Neuro: Cranial nerves intact. Normal muscle tone, no cerebellar symptoms. Psych: Awake and oriented X 3, normal affect, Insight and Judgment appropriate.    Vicie Mutters, PA-C 9:43 AM [facility]

## 2016-06-25 NOTE — Patient Instructions (Signed)

## 2016-06-26 LAB — HEMOGLOBIN A1C
Hgb A1c MFr Bld: 6 % — ABNORMAL HIGH (ref <5.7)
Mean Plasma Glucose: 126 mg/dL

## 2016-08-10 ENCOUNTER — Encounter: Payer: Self-pay | Admitting: Internal Medicine

## 2016-08-10 ENCOUNTER — Ambulatory Visit (INDEPENDENT_AMBULATORY_CARE_PROVIDER_SITE_OTHER): Payer: Medicare Other | Admitting: Internal Medicine

## 2016-08-10 VITALS — BP 124/62 | HR 72 | Temp 98.1°F | Ht 61.0 in | Wt 167.2 lb

## 2016-08-10 DIAGNOSIS — T148 Other injury of unspecified body region: Secondary | ICD-10-CM | POA: Diagnosis not present

## 2016-08-10 DIAGNOSIS — Z23 Encounter for immunization: Secondary | ICD-10-CM

## 2016-08-10 DIAGNOSIS — W5503XA Scratched by cat, initial encounter: Secondary | ICD-10-CM | POA: Diagnosis not present

## 2016-08-10 MED ORDER — MUPIROCIN 2 % EX OINT: 1.0000 "application " | TOPICAL_OINTMENT | Freq: Two times a day (BID) | CUTANEOUS | 0 refills | Status: DC

## 2016-08-10 MED ORDER — DOXYCYCLINE HYCLATE 100 MG PO CAPS: 100.0000 mg | ORAL_CAPSULE | Freq: Two times a day (BID) | ORAL | 0 refills | Status: DC

## 2016-08-10 NOTE — Progress Notes (Signed)
   Subjective:    Patient ID: Briana [name], female    DOB: 03-10-1934, 82 y.o.   MRN: [id number]  HPI  Patient presents to the office for evaluation of left foot scratch by a cat on Saturday.  She has had some mild redness.  She has been washing it with soap and water.  She has no fevers, chills, no nausea or vomiting.  She is not having any drainage.  All the cats are up to date on their vaccines.     Review of Systems  Constitutional: Negative for chills, fatigue and fever.  Respiratory: Negative for chest tightness and shortness of breath.   Cardiovascular: Negative for chest pain and palpitations.  Gastrointestinal: Negative for nausea and vomiting.  Skin: Positive for wound.       Objective:   Physical Exam  Constitutional: She is oriented to person, place, and time. She appears well-developed and well-nourished. No distress.  HENT:  Head: Normocephalic.  Mouth/Throat: Oropharynx is clear and moist. No oropharyngeal exudate.  Eyes: Conjunctivae are normal. No scleral icterus.  Neck: Normal range of motion. Neck supple. No JVD present. No thyromegaly present.  Cardiovascular: Normal rate, regular rhythm, normal heart sounds and intact distal pulses.  Exam reveals no gallop and no friction rub.   No murmur heard. Pulmonary/Chest: Effort normal and breath sounds normal. No respiratory distress. She has no wheezes. She has no rales. She exhibits no tenderness.  Abdominal: Soft. Bowel sounds are normal.  Musculoskeletal: Normal range of motion.  Lymphadenopathy:    She has no cervical adenopathy.  Neurological: She is alert and oriented to person, place, and time.  Skin: Skin is warm and dry. She is not diaphoretic.  Two, scattered 1 cm superficial scratches without expressible discharge or drainage.  Surrounding redness and warmth with mild swelling.   Mildly tender to palpation.    Psychiatric: She has a normal mood and affect. Her behavior is normal. Judgment and thought  content normal.  Nursing note and vitals reviewed.   Vitals:   08/10/16 1556  BP: 124/62  Pulse: 72  Temp: 98.1 F (36.7 C)          Assessment & Plan:    1. Cat scratch -call office if spreading redness, fever, chills, nausea or vomiting. - doxycycline (VIBRAMYCIN) 100 MG capsule; Take 1 capsule (100 mg total) by mouth 2 (two) times daily. One po bid x 7 days  Dispense: 14 capsule; Refill: 0 - mupirocin ointment (BACTROBAN) 2 %; Place 1 application into the nose 2 (two) times daily.  Dispense: 22 g; Refill: 0  2. Need for influenza vaccination  - Flu vaccine HIGH DOSE PF

## 2016-08-31 ENCOUNTER — Encounter: Payer: Self-pay | Admitting: Internal Medicine

## 2016-09-08 ENCOUNTER — Other Ambulatory Visit: Payer: Self-pay | Admitting: Internal Medicine

## 2016-09-16 ENCOUNTER — Other Ambulatory Visit: Payer: Self-pay | Admitting: Internal Medicine

## 2016-09-18 ENCOUNTER — Encounter: Payer: Self-pay | Admitting: Internal Medicine

## 2016-10-05 ENCOUNTER — Other Ambulatory Visit: Payer: Self-pay | Admitting: Internal Medicine

## 2016-10-05 ENCOUNTER — Encounter: Payer: Self-pay | Admitting: Internal Medicine

## 2016-10-05 ENCOUNTER — Ambulatory Visit (INDEPENDENT_AMBULATORY_CARE_PROVIDER_SITE_OTHER): Payer: Medicare Other | Admitting: Internal Medicine

## 2016-10-05 VITALS — BP 174/98 | HR 72 | Temp 98.4°F | Resp 16 | Ht 61.0 in | Wt 165.0 lb

## 2016-10-05 DIAGNOSIS — J069 Acute upper respiratory infection, unspecified: Secondary | ICD-10-CM | POA: Diagnosis not present

## 2016-10-05 MED ORDER — BENZONATATE 200 MG PO CAPS: 200.0000 mg | ORAL_CAPSULE | Freq: Three times a day (TID) | ORAL | 0 refills | Status: DC | PRN

## 2016-10-05 MED ORDER — DEXAMETHASONE 0.75 MG PO TABS: ORAL_TABLET | ORAL | 0 refills | Status: DC

## 2016-10-05 MED ORDER — AZITHROMYCIN 250 MG PO TABS: ORAL_TABLET | ORAL | 0 refills | Status: DC

## 2016-10-05 MED ORDER — FLUTICASONE PROPIONATE 50 MCG/ACT NA SUSP: 2.0000 | Freq: Every day | NASAL | 0 refills | Status: DC

## 2016-10-05 NOTE — Progress Notes (Signed)
HPI  Patient presents to the office for evaluation of cough.  It has been going on for 1 weeks.  Patient reports night > day, wet, worse with lying down, brown sputum production.  They also endorse change in voice, chills, postnasal drip, sputum production and nasal congestion, rhinorrhea, clear nasal sputum, sore throat, back pain from coughing, and headache. .  They have tried Vicks vapor rubs, mucinex, tylenol.  They report that nothing has worked.  They denies other sick contacts.  Review of Systems  Constitutional: Positive for malaise/fatigue. Negative for chills and fever.  HENT: Positive for congestion, ear pain, hearing loss and sore throat.   Respiratory: Positive for cough and sputum production. Negative for shortness of breath and wheezing.   Cardiovascular: Negative for chest pain, palpitations and leg swelling.  Neurological: Positive for headaches.    PE:  Vitals:   10/05/16 1047  BP: (!) 174/98  Pulse: 72  Resp: 16  Temp: 98.4 F (36.9 C)    General:  Alert and non-toxic, WDWN, NAD HEENT: NCAT, PERLA, EOM normal, no occular discharge or erythema.  Nasal mucosal edema with sinus tenderness to palpation.  Oropharynx clear with minimal oropharyngeal edema and erythema.  Mucous membranes moist and pink. Neck:  Cervical adenopathy Chest:  RRR no MRGs.  Lungs clear to auscultation A&P with no wheezes rhonchi or rales.   Abdomen: +BS x 4 quadrants, soft, non-tender, no guarding, rigidity, or rebound. Skin: warm and dry no rash Neuro: A&Ox4, CN II-XII grossly intact  Assessment and Plan:   1. Acute URI -zpak -tessalon -decadron -flonase -nasal saline -tylenol prn -albuterol q6hrs prn -monitor BP at home, likely from OTC cough meds should resolve with switch to tessalon

## 2016-10-05 NOTE — Patient Instructions (Signed)
Please take the zpak daily until it is gone.  Please take the decadron as prescribed until it is gone.  Take it with breakfast.  Please start taking 2 sprays per nostril of the flonase nasal spray.  Please use the albuterol inhaler every 4-6 hours as needed for shortness of breath and coughing.  Please drink plenty of fluids every day.  Mint tea and honey will help with sore throat and cough.  Please take tylenol as needed for soreness in your body  Please take the tessalon perles for coughing.  You can take this 3 times per day.

## 2016-10-19 ENCOUNTER — Ambulatory Visit (INDEPENDENT_AMBULATORY_CARE_PROVIDER_SITE_OTHER): Payer: Medicare Other | Admitting: Internal Medicine

## 2016-10-19 ENCOUNTER — Encounter: Payer: Self-pay | Admitting: Internal Medicine

## 2016-10-19 VITALS — BP 146/80 | HR 60 | Temp 97.5°F | Resp 16 | Ht 61.0 in | Wt 166.4 lb

## 2016-10-19 DIAGNOSIS — E782 Mixed hyperlipidemia: Secondary | ICD-10-CM | POA: Diagnosis not present

## 2016-10-19 DIAGNOSIS — Z79899 Other long term (current) drug therapy: Secondary | ICD-10-CM

## 2016-10-19 DIAGNOSIS — E559 Vitamin D deficiency, unspecified: Secondary | ICD-10-CM | POA: Diagnosis not present

## 2016-10-19 DIAGNOSIS — I10 Essential (primary) hypertension: Secondary | ICD-10-CM

## 2016-10-19 DIAGNOSIS — K2100 Gastro-esophageal reflux disease with esophagitis, without bleeding: Secondary | ICD-10-CM

## 2016-10-19 DIAGNOSIS — Z0001 Encounter for general adult medical examination with abnormal findings: Secondary | ICD-10-CM | POA: Diagnosis not present

## 2016-10-19 DIAGNOSIS — R7303 Prediabetes: Secondary | ICD-10-CM

## 2016-10-19 DIAGNOSIS — Z136 Encounter for screening for cardiovascular disorders: Secondary | ICD-10-CM

## 2016-10-19 DIAGNOSIS — Z23 Encounter for immunization: Secondary | ICD-10-CM | POA: Diagnosis not present

## 2016-10-19 DIAGNOSIS — R6889 Other general symptoms and signs: Secondary | ICD-10-CM | POA: Diagnosis not present

## 2016-10-19 DIAGNOSIS — Z1212 Encounter for screening for malignant neoplasm of rectum: Secondary | ICD-10-CM

## 2016-10-19 LAB — CBC WITH DIFFERENTIAL/PLATELET
Basophils Absolute: 0 cells/uL (ref 0–200)
Basophils Relative: 0 %
Eosinophils Absolute: 231 cells/uL (ref 15–500)
Eosinophils Relative: 3 %
HCT: 41.7 % (ref 35.0–45.0)
Hemoglobin: 13.4 g/dL (ref 11.7–15.5)
Lymphocytes Relative: 20 %
Lymphs Abs: 1540 cells/uL (ref 850–3900)
MCH: 29.7 pg (ref 27.0–33.0)
MCHC: 32.1 g/dL (ref 32.0–36.0)
MCV: 92.5 fL (ref 80.0–100.0)
MPV: 10.7 fL (ref 7.5–12.5)
Monocytes Absolute: 693 cells/uL (ref 200–950)
Monocytes Relative: 9 %
Neutro Abs: 5236 cells/uL (ref 1500–7800)
Neutrophils Relative %: 68 %
Platelets: 202 10*3/uL (ref 140–400)
RBC: 4.51 MIL/uL (ref 3.80–5.10)
RDW: 14.6 % (ref 11.0–15.0)
WBC: 7.7 10*3/uL (ref 3.8–10.8)

## 2016-10-19 LAB — HEMOGLOBIN A1C
Hgb A1c MFr Bld: 6.2 % — ABNORMAL HIGH (ref <5.7)
Mean Plasma Glucose: 131 mg/dL

## 2016-10-19 LAB — TSH: TSH: 1.12 mIU/L

## 2016-10-19 NOTE — Progress Notes (Signed)
[facility] Unk Pinto, M.D.    [name]. Silverio Lay, P.A.-C      Starlyn Skeans, P.A.-C  [facility]                [address] [place] [address] Telephone [phone] Telefax [phone]  Annual Screening/Preventative Visit & Comprehensive Evaluation &  Examination     This very nice 82 y.o. [facility] presents for a Screening/Preventative Visit & comprehensive evaluation and management of multiple medical co-morbidities.  Patient has been followed for HTN, Prediabetes, Hyperlipidemia and Vitamin D Deficiency. Patient is also followed by Dr Nelva Bush with [facility] for Lumbar spinal stenosis and had L3/L5 SS decompression surg in July 2016 by Dr Apolonio Schneiders.      HTN predates circa 1996.  Patient's BP has been controlled at home and patient denies any cardiac symptoms as chest pain, palpitations, shortness of breath, dizziness or ankle swelling. Today's BP is 146/80.      Patient's hyperlipidemia is controlled with diet and medications. Patient denies myalgias or other medication SE's. Last lipids were at goal: Lab Results  Component Value Date   CHOL 161 06/25/2016   HDL 60 06/25/2016   LDLCALC 86 06/25/2016   TRIG 74 06/25/2016   CHOLHDL 2.7 06/25/2016      Patient has hx/o Morbid Obesity (BMI 31+) and consequent prediabetes predating with A1c 6.6% since 2011 and patient denies reactive hypoglycemic symptoms, visual blurring, diabetic polys, or paresthesias. Last A1c was still not at goal: Lab Results  Component Value Date   HGBA1C 6.0 (H) 06/25/2016      Finally, patient has history of Vitamin D Deficiency in 2008 of "23" and last Vitamin D was at goal: Lab Results  Component Value Date   VD25OH 66 03/19/2016   Current Outpatient Prescriptions on File Prior to Visit  Medication Sig  . acetaminophen (TYLENOL) 650 MG CR tablet Take 650 mg by mouth every 8 (eight) hours as needed for pain.  Marland Kitchen  albuterol (PROVENTIL HFA;VENTOLIN HFA) 108 (90 BASE) MCG/ACT inhaler Inhale 2 puffs into the lungs every 6 (six) hours as needed for wheezing or shortness of breath.  . Ascorbic Acid (VITAMIN C) 1000 MG tablet Take 1,000 mg by mouth 2 (two) times daily.  Marland Kitchen aspirin 81 MG tablet Take 81 mg by mouth daily.  Marland Kitchen b complex vitamins tablet Take 1 tablet by mouth daily.  . bumetanide (BUMEX) 1 MG tablet TAKE ONE-HALF TO ONE TABLET BY MOUTH TWICE DAILY AS NEEDED FOR SWELLING  . Calcium Carbonate (CALCIUM 600 PO) Take 600 mg by mouth every evening.  . Cholecalciferol (VITAMIN D3) 2000 UNITS TABS Take 2,000-4,000 Units by mouth 2 (two) times daily. Take 4000 units in the morning and 2000 units in the evening  . fluticasone (FLONASE) 50 MCG/ACT nasal spray Place 2 sprays into both nostrils daily.  Marland Kitchen MELATONIN PO Take by mouth. Takes 1 at bedtime  . Misc Natural Products (OSTEO BI-FLEX JOINT SHIELD PO) Take 1 tablet by mouth 2 (two) times daily.   . propranolol ER (INDERAL LA) 80 MG 24 hr capsule TAKE ONE CAPSULE BY MOUTH ONCE DAILY FOR TREMORS  . ranitidine (ZANTAC) 300 MG tablet TAKE ONE TABLET BY MOUTH TWICE DAILY FOR ACID REFLUX  . tamoxifen (NOLVADEX) 20 MG tablet TAKE ONE TABLET BY MOUTH ONCE DAILY   No current facility-administered medications on file prior to  visit.    Allergies  Allergen Reactions  . Accupril [Quinapril Hcl] Cough  . Neosporin [Neomycin-Bacitracin Zn-Polymyx]   . Prednisone     Agitated and shaky  . Reglan [Metoclopramide] Other (See Comments)    tremors  . Tramadol Nausea And Vomiting  . Adhesive [Tape] Rash   Past Medical History:  Diagnosis Date  . Cancer ([place])    skin cancer  . DDD (degenerative disc disease)   . Diabetes mellitus without complication ([place])    "pre-diabetes"  . Diverticulitis   . DJD (degenerative joint disease)   . GERD (gastroesophageal reflux disease)    takes ranitidine  . Heart murmur   . History of hiatal hernia   . History of pneumonia    . Hx of irritable bowel syndrome   . Hyperlipidemia   . Occasional tremors   . Osteoporosis   . Pre-diabetes   . Varicose veins   . Vitamin D deficiency    Health Maintenance  Topic Date Due  . PNA vac Low Risk Adult (2 of 2 - PCV13) 11/16/2002  . TETANUS/TDAP  09/04/2025  . INFLUENZA VACCINE  Completed  . DEXA SCAN  Completed  . ZOSTAVAX  Completed   Immunization History  Administered Date(s) Administered  . DT 09/05/2015  . DTaP 11/16/2004  . Influenza Whole 08/30/2013  . Influenza, High Dose Seasonal PF 08/28/2015, 08/10/2016  . Pneumococcal Polysaccharide-23 11/16/2001  . Zoster 06/01/2013   Past Surgical History:  Procedure Laterality Date  . APPENDECTOMY    . BREAST SURGERY Right    fluid drained from breast  . COLONOSCOPY    . ESOPHAGOGASTRODUODENOSCOPY    . JOINT REPLACEMENT Left    left hip  . LUMBAR LAMINECTOMY/DECOMPRESSION MICRODISCECTOMY N/A 06/05/2015   Procedure: L3-L5 DECOMPRESSION ;  Surgeon: Melina Schools, MD;  Location: [facility];  Service: Orthopedics;  Laterality: N/A;  . OPEN REDUCTION INTERNAL FIXATION (ORIF) DISTAL RADIAL FRACTURE Right 01/10/2014   Procedure: OPEN REDUCTION INTERNAL FIXATION (ORIF) DISTAL RADIAL FRACTURE;  Surgeon: Linna Hoff, MD;  Location: [facility];  Service: Orthopedics;  Laterality: Right;  . SPINAL CORD DECOMPRESSION  06/05/2015   L3 L 5  . THYROID SURGERY    . TONSILLECTOMY    . TUBAL LIGATION    . varicose veins stripped     Family History  Problem Relation Age of Onset  . Hypertension Mother   . Hyperlipidemia Mother   . Heart disease Sister   . Cancer Brother     prostate   Social History  Substance Use Topics  . Smoking status: Never Smoker  . Smokeless tobacco: Never Used  . Alcohol use No    ROS Constitutional: Denies fever, chills, weight loss/gain, headaches, insomnia,  night sweats, and change in appetite. Does c/o fatigue. Eyes: Denies redness, blurred vision, diplopia, discharge, itchy, watery eyes.   ENT: Denies discharge, congestion, post nasal drip, epistaxis, sore throat, earache, hearing loss, dental pain, Tinnitus, Vertigo, Sinus pain, snoring.  Cardio: Denies chest pain, palpitations, irregular heartbeat, syncope, dyspnea, diaphoresis, orthopnea, PND, claudication, edema Respiratory: denies cough, dyspnea, DOE, pleurisy, hoarseness, laryngitis, wheezing.  Gastrointestinal: Denies dysphagia, heartburn, reflux, water brash, pain, cramps, nausea, vomiting, bloating, diarrhea, constipation, hematemesis, melena, hematochezia, jaundice, hemorrhoids Genitourinary: Denies dysuria, frequency, urgency, nocturia, hesitancy, discharge, hematuria, flank pain Breast: Breast lumps, nipple discharge, bleeding.  Musculoskeletal: Denies arthralgia, myalgia, stiffness, Jt. Swelling, pain, limp, and strain/sprain. Denies falls. Skin: Denies puritis, rash, hives, warts, acne, eczema, changing in skin lesion Neuro: No weakness, tremor,  incoordination, spasms, paresthesia, pain Psychiatric: Denies confusion, memory loss, sensory loss. Denies Depression. Endocrine: Denies change in weight, skin, hair change, nocturia, and paresthesia, diabetic polys, visual blurring, hyper / hypo glycemic episodes.  Heme/Lymph: No excessive bleeding, bruising, enlarged lymph nodes.  Physical Exam  BP (!) 146/80   Pulse 60   Temp 97.5 F (36.4 C)   Resp 16   Ht 5\' 1"  (1.549 m)   Wt 166 lb 6.4 oz (75.5 kg)   BMI 31.44 kg/m   General Appearance: Well nourished and in no apparent distress.  Eyes: PERRLA, EOMs, conjunctiva no swelling or erythema, normal fundi and vessels. Sinuses: No frontal/maxillary tenderness ENT/Mouth: EACs patent / TMs  nl. Nares clear without erythema, swelling, mucoid exudates. Oral hygiene is good. No erythema, swelling, or exudate. Tongue normal, non-obstructing. Tonsils not swollen or erythematous. Hearing normal.  Neck: Supple, thyroid normal. No bruits, nodes or JVD. Respiratory:  Respiratory effort normal.  BS equal and clear bilateral without rales, rhonci, wheezing or stridor. Cardio: Heart sounds are normal with regular rate and rhythm and no murmurs, rubs or gallops. Peripheral pulses are normal and equal bilaterally without edema. No aortic or femoral bruits. Chest: symmetric with normal excursions and percussion. Breasts: Symmetric, without lumps, nipple discharge, retractions, or fibrocystic changes.  Abdomen: Flat, soft with bowel sounds active. Nontender, no guarding, rebound, hernias, masses, or organomegaly.  Lymphatics: Non tender without lymphadenopathy.  Genitourinary:  Musculoskeletal: Full ROM all peripheral extremities, joint stability, 5/5 strength, and normal gait. Skin: Warm and dry without rashes, lesions, cyanosis, clubbing or  ecchymosis.  Neuro: Cranial nerves intact, reflexes equal bilaterally. Normal muscle tone, no cerebellar symptoms. Sensation intact.  Pysch: Alert and oriented X 3, normal affect, Insight and Judgment appropriate.   Assessment and Plan  1. Encounter for general adult medical examination with abnormal findings   2. Essential hypertension  - Microalbumin / creatinine urine ratio - EKG 12-Lead - Urinalysis, Routine w reflex microscopic  - CBC with Differential/Platelet - BASIC METABOLIC PANEL WITH GFR - TSH  3. Hyperlipidemia  - EKG 12-Lead - Hepatic function panel - Lipid panel - TSH  4. Prediabetes  - EKG 12-Lead - Hemoglobin A1c - Insulin, random  5. Vitamin D deficiency  - VITAMIN D 25 Hydroxy   6. GERD   7. Screening for rectal cancer  - POC Hemoccult Bld/St  8. Screening for ischemic heart disease  - EKG 12-Lead  9. Medication management  - Urinalysis, Routine w reflex microscopic  - CBC with Differential/Platelet - BASIC METABOLIC PANEL WITH GFR - Hepatic function panel - Magnesium  10. Need for prophylactic vaccination against Streptococcus pneumoniae (pneumococcus)  -  Pneumococcal conjugate vaccine 13-valent       Continue prudent diet as discussed, weight control, BP monitoring, regular exercise, and medications. Discussed med's effects and SE's. Screening labs and tests as requested with regular follow-up as recommended. Over 40 minutes of exam, counseling, chart review and high complex critical decision making was performed.

## 2016-10-19 NOTE — Patient Instructions (Signed)

## 2016-10-20 LAB — LIPID PANEL
Cholesterol: 156 mg/dL (ref <200)
HDL: 52 mg/dL (ref >50)
LDL Cholesterol: 81 mg/dL (ref <100)
Total CHOL/HDL Ratio: 3 Ratio (ref <5.0)
Triglycerides: 117 mg/dL (ref <150)
VLDL: 23 mg/dL (ref <30)

## 2016-10-20 LAB — BASIC METABOLIC PANEL WITH GFR
BUN: 25 mg/dL (ref 7–25)
CO2: 30 mmol/L (ref 20–31)
Calcium: 9.7 mg/dL (ref 8.6–10.4)
Chloride: 104 mmol/L (ref 98–110)
Creat: 0.95 mg/dL — ABNORMAL HIGH (ref 0.60–0.88)
GFR, Est African American: 65 mL/min (ref >=60)
GFR, Est Non African American: 57 mL/min — ABNORMAL LOW (ref >=60)
Glucose, Bld: 112 mg/dL — ABNORMAL HIGH (ref 65–99)
Potassium: 4.1 mmol/L (ref 3.5–5.3)
Sodium: 142 mmol/L (ref 135–146)

## 2016-10-20 LAB — URINALYSIS, ROUTINE W REFLEX MICROSCOPIC
Bilirubin Urine: NEGATIVE
Glucose, UA: NEGATIVE
Hgb urine dipstick: NEGATIVE
Ketones, ur: NEGATIVE
Nitrite: NEGATIVE
Protein, ur: NEGATIVE
Specific Gravity, Urine: 1.015 (ref 1.001–1.035)
pH: 5.5 (ref 5.0–8.0)

## 2016-10-20 LAB — URINALYSIS, MICROSCOPIC ONLY
Casts: NONE SEEN [LPF]
Crystals: NONE SEEN [HPF]
Yeast: NONE SEEN [HPF]

## 2016-10-20 LAB — HEPATIC FUNCTION PANEL
ALT: 15 U/L (ref 6–29)
AST: 20 U/L (ref 10–35)
Albumin: 3.7 g/dL (ref 3.6–5.1)
Alkaline Phosphatase: 55 U/L (ref 33–130)
Bilirubin, Direct: 0.1 mg/dL (ref <=0.2)
Indirect Bilirubin: 0.4 mg/dL (ref 0.2–1.2)
Total Bilirubin: 0.5 mg/dL (ref 0.2–1.2)
Total Protein: 6 g/dL — ABNORMAL LOW (ref 6.1–8.1)

## 2016-10-20 LAB — INSULIN, RANDOM: Insulin: 20.1 u[IU]/mL — ABNORMAL HIGH (ref 2.0–19.6)

## 2016-10-20 LAB — VITAMIN D 25 HYDROXY (VIT D DEFICIENCY, FRACTURES): Vit D, 25-Hydroxy: 73 ng/mL (ref 30–100)

## 2016-10-20 LAB — MAGNESIUM: Magnesium: 1.8 mg/dL (ref 1.5–2.5)

## 2016-10-20 LAB — MICROALBUMIN / CREATININE URINE RATIO
Creatinine, Urine: 100 mg/dL (ref 20–320)
Microalb Creat Ratio: 25 mcg/mg creat (ref <30)
Microalb, Ur: 2.5 mg/dL

## 2016-10-29 DIAGNOSIS — H25813 Combined forms of age-related cataract, bilateral: Secondary | ICD-10-CM | POA: Diagnosis not present

## 2016-10-29 DIAGNOSIS — H353131 Nonexudative age-related macular degeneration, bilateral, early dry stage: Secondary | ICD-10-CM | POA: Diagnosis not present

## 2016-10-29 DIAGNOSIS — E119 Type 2 diabetes mellitus without complications: Secondary | ICD-10-CM | POA: Diagnosis not present

## 2016-10-29 DIAGNOSIS — H04123 Dry eye syndrome of bilateral lacrimal glands: Secondary | ICD-10-CM | POA: Diagnosis not present

## 2016-11-03 ENCOUNTER — Other Ambulatory Visit: Payer: Self-pay | Admitting: Internal Medicine

## 2016-12-07 ENCOUNTER — Other Ambulatory Visit: Payer: Self-pay | Admitting: Internal Medicine

## 2016-12-07 DIAGNOSIS — Z1231 Encounter for screening mammogram for malignant neoplasm of breast: Secondary | ICD-10-CM

## 2017-01-06 ENCOUNTER — Ambulatory Visit
Admission: RE | Admit: 2017-01-06 | Discharge: 2017-01-06 | Disposition: A | Discharge status: Home / Self Care | Payer: Medicare Other | Source: Ambulatory Visit | Attending: Internal Medicine | Admitting: Internal Medicine

## 2017-01-06 DIAGNOSIS — Z1231 Encounter for screening mammogram for malignant neoplasm of breast: Secondary | ICD-10-CM

## 2017-01-26 ENCOUNTER — Ambulatory Visit (INDEPENDENT_AMBULATORY_CARE_PROVIDER_SITE_OTHER): Payer: Medicare Other | Admitting: Internal Medicine

## 2017-01-26 VITALS — BP 136/82 | HR 66 | Temp 98.6°F | Resp 16 | Ht 61.0 in | Wt 170.0 lb

## 2017-01-26 DIAGNOSIS — Z0000 Encounter for general adult medical examination without abnormal findings: Secondary | ICD-10-CM

## 2017-01-26 DIAGNOSIS — K2100 Gastro-esophageal reflux disease with esophagitis, without bleeding: Secondary | ICD-10-CM

## 2017-01-26 DIAGNOSIS — R7303 Prediabetes: Secondary | ICD-10-CM | POA: Diagnosis not present

## 2017-01-26 DIAGNOSIS — N6019 Diffuse cystic mastopathy of unspecified breast: Secondary | ICD-10-CM

## 2017-01-26 DIAGNOSIS — M48061 Spinal stenosis, lumbar region without neurogenic claudication: Secondary | ICD-10-CM | POA: Diagnosis not present

## 2017-01-26 DIAGNOSIS — R6889 Other general symptoms and signs: Secondary | ICD-10-CM | POA: Diagnosis not present

## 2017-01-26 DIAGNOSIS — I10 Essential (primary) hypertension: Secondary | ICD-10-CM | POA: Diagnosis not present

## 2017-01-26 DIAGNOSIS — E559 Vitamin D deficiency, unspecified: Secondary | ICD-10-CM | POA: Diagnosis not present

## 2017-01-26 DIAGNOSIS — E782 Mixed hyperlipidemia: Secondary | ICD-10-CM | POA: Diagnosis not present

## 2017-01-26 DIAGNOSIS — N3941 Urge incontinence: Secondary | ICD-10-CM

## 2017-01-26 DIAGNOSIS — Z6828 Body mass index (BMI) 28.0-28.9, adult: Secondary | ICD-10-CM | POA: Diagnosis not present

## 2017-01-26 DIAGNOSIS — Z0001 Encounter for general adult medical examination with abnormal findings: Secondary | ICD-10-CM | POA: Diagnosis not present

## 2017-01-26 DIAGNOSIS — Z79899 Other long term (current) drug therapy: Secondary | ICD-10-CM | POA: Diagnosis not present

## 2017-01-26 DIAGNOSIS — M810 Age-related osteoporosis without current pathological fracture: Secondary | ICD-10-CM | POA: Diagnosis not present

## 2017-01-26 LAB — CBC WITH DIFFERENTIAL/PLATELET
Basophils Absolute: 0 cells/uL (ref 0–200)
Basophils Relative: 0 %
Eosinophils Absolute: 84 cells/uL (ref 15–500)
Eosinophils Relative: 1 %
HCT: 41.1 % (ref 35.0–45.0)
Hemoglobin: 13.1 g/dL (ref 11.7–15.5)
Lymphocytes Relative: 17 %
Lymphs Abs: 1428 cells/uL (ref 850–3900)
MCH: 28.8 pg (ref 27.0–33.0)
MCHC: 31.9 g/dL — ABNORMAL LOW (ref 32.0–36.0)
MCV: 90.3 fL (ref 80.0–100.0)
MPV: 11.1 fL (ref 7.5–12.5)
Monocytes Absolute: 840 cells/uL (ref 200–950)
Monocytes Relative: 10 %
Neutro Abs: 6048 cells/uL (ref 1500–7800)
Neutrophils Relative %: 72 %
Platelets: 185 10*3/uL (ref 140–400)
RBC: 4.55 MIL/uL (ref 3.80–5.10)
RDW: 14.7 % (ref 11.0–15.0)
WBC: 8.4 10*3/uL (ref 3.8–10.8)

## 2017-01-26 LAB — HEPATIC FUNCTION PANEL
ALT: 11 U/L (ref 6–29)
AST: 19 U/L (ref 10–35)
Albumin: 4 g/dL (ref 3.6–5.1)
Alkaline Phosphatase: 55 U/L (ref 33–130)
Bilirubin, Direct: 0.1 mg/dL (ref <=0.2)
Indirect Bilirubin: 0.5 mg/dL (ref 0.2–1.2)
Total Bilirubin: 0.6 mg/dL (ref 0.2–1.2)
Total Protein: 6.3 g/dL (ref 6.1–8.1)

## 2017-01-26 LAB — BASIC METABOLIC PANEL WITH GFR
BUN: 17 mg/dL (ref 7–25)
CO2: 30 mmol/L (ref 20–31)
Calcium: 9.1 mg/dL (ref 8.6–10.4)
Chloride: 104 mmol/L (ref 98–110)
Creat: 1 mg/dL — ABNORMAL HIGH (ref 0.60–0.88)
GFR, Est African American: 61 mL/min (ref >=60)
GFR, Est Non African American: 53 mL/min — ABNORMAL LOW (ref >=60)
Glucose, Bld: 103 mg/dL — ABNORMAL HIGH (ref 65–99)
Potassium: 3.9 mmol/L (ref 3.5–5.3)
Sodium: 142 mmol/L (ref 135–146)

## 2017-01-26 NOTE — Patient Instructions (Signed)
Please stop the tamoxifen.    Please try taking 1 tablet of myrbetriq once daily to see if you can stop the urinary leakage.

## 2017-01-26 NOTE — Progress Notes (Signed)
MEDICARE ANNUAL WELLNESS VISIT AND FOLLOW UP Assessment:    1. Essential hypertension -well controlled -cont current medications -dash diet -monitor at home -exercise as tolerated  2. Hyperlipidemia -well controlled -cont diet and exercise -lipid panel twice yearly  3. Prediabetes -cont diet and exercise - Hemoglobin A1c  4. Vitamin D deficiency -cont Vit D and Calcium supplement  5. Medication management  - CBC with Differential/Platelet - BASIC METABOLIC PANEL WITH GFR - Hepatic function panel  6. GERD -cont ranitidine prn  7. Age-related osteoporosis without current pathological fracture -d/c tamoxifen as this is placing patient at higher risk for endometrial cancer -cont Vit D and calcium  8. BMI 28.63,  adult -cont diet and exercise   9. Fibrocystic breast disease (FCBD), unspecified laterality -cont yearly mammogram  10. Spinal stenosis of lumbar region without neurogenic claudication -cont water aerobics and stretching  11. Urge incontinence -try 25mg  myrbetriq -recommended emptying bladder on a schedule -avoid caffeine and alcohol    Over 30 minutes of exam, counseling, chart review, and critical decision making was performed  Future Appointments Date Time Provider [place]  05/04/2017 11:00 AM Unk Pinto, MD [facility] None  11/18/2017 10:00 AM Unk Pinto, MD [facility] None     Plan:   During the course of the visit the patient was educated and counseled about appropriate screening and preventive services including:    Pneumococcal vaccine   Influenza vaccine  Prevnar 13  Td vaccine  Screening electrocardiogram  Colorectal cancer screening  Diabetes screening  Glaucoma screening  Nutrition counseling    Subjective:  DEMETRESS TIFT is a 83 y.o. female who presents for Medicare Annual Wellness Visit and 3 month follow up for HTN, hyperlipidemia, prediabetes, and vitamin D Def.   Her blood pressure has been  controlled at home, today their BP is BP: 136/82 She does workout. She denies chest pain, shortness of breath, dizziness. She is generally doing water aerobics 2-3 times weekly.  She has not gone much in the last week due to weather.    She is on cholesterol medication and denies myalgias. Her cholesterol is at goal. The cholesterol last visit was:   Lab Results  Component Value Date   CHOL 156 10/19/2016   HDL 52 10/19/2016   LDLCALC 81 10/19/2016   TRIG 117 10/19/2016   CHOLHDL 3.0 10/19/2016   She is continuing to work on her diet and is exercising regularly.  She has a history of A1c levels in the controlled range for prediabetes.   Lab Results  Component Value Date   HGBA1C 6.2 (H) 10/19/2016   Last GFR Lab Results  Component Value Date   GFRNONAA 57 (L) 10/19/2016     Lab Results  Component Value Date   GFRAA 65 10/19/2016   Patient is on Vitamin D supplement.   Lab Results  Component Value Date   VD25OH 70 10/19/2016      She reports that somebody came to the house for a house call and she told her that she is having some urinary incontinence.  She reports that she has both leakage when she has some urge or when she coughs or sneezes.  She reports that she has to wear pads.    Medication Review: Current Outpatient Prescriptions on File Prior to Visit  Medication Sig Dispense Refill  . acetaminophen (TYLENOL) 650 MG CR tablet Take 650 mg by mouth every 8 (eight) hours as needed for pain.    Marland Kitchen albuterol (PROVENTIL HFA;VENTOLIN HFA)  108 (90 BASE) MCG/ACT inhaler Inhale 2 puffs into the lungs every 6 (six) hours as needed for wheezing or shortness of breath. 1 Inhaler 2  . Ascorbic Acid (VITAMIN C) 1000 MG tablet Take 1,000 mg by mouth 2 (two) times daily.    Marland Kitchen aspirin 81 MG tablet Take 81 mg by mouth daily.    Marland Kitchen b complex vitamins tablet Take 1 tablet by mouth daily.    . bumetanide (BUMEX) 1 MG tablet TAKE ONE-HALF TO ONE TABLET BY MOUTH TWICE DAILY AS NEEDED FOR  SWELLING 90 tablet 1  . Calcium Carbonate (CALCIUM 600 PO) Take 600 mg by mouth every evening.    . Cholecalciferol (VITAMIN D3) 2000 UNITS TABS Take 2,000-4,000 Units by mouth 2 (two) times daily. Take 4000 units in the morning and 2000 units in the evening    . fluticasone (FLONASE) 50 MCG/ACT nasal spray Place 2 sprays into both nostrils daily. 16 g 0  . MELATONIN PO Take by mouth. Takes 1 at bedtime    . Misc Natural Products (OSTEO BI-FLEX JOINT SHIELD PO) Take 1 tablet by mouth 2 (two) times daily.     . propranolol ER (INDERAL LA) 80 MG 24 hr capsule TAKE ONE CAPSULE BY MOUTH ONCE DAILY FOR TREMORS 90 capsule 1  . ranitidine (ZANTAC) 300 MG tablet TAKE ONE TABLET BY MOUTH TWICE DAILY FOR ACID REFLUX 180 tablet 1   No current facility-administered medications on file prior to visit.     Allergies: Allergies  Allergen Reactions  . Accupril [Quinapril Hcl] Cough  . Neosporin [Neomycin-Bacitracin Zn-Polymyx]   . Prednisone     Agitated and shaky  . Reglan [Metoclopramide] Other (See Comments)    tremors  . Tramadol Nausea And Vomiting  . Adhesive [Tape] Rash    Current Problems (verified) has Essential hypertension; Hyperlipidemia; Prediabetes; Vitamin D deficiency; Diffuse cystic mastopathy; GERD; Osteoporosis; Medication management; Lumbar stenosis; and BMI 28.63,  adult on her problem list.  Screening Tests Immunization History  Administered Date(s) Administered  . DT 09/05/2015  . DTaP 11/16/2004  . Influenza Whole 08/30/2013  . Influenza, High Dose Seasonal PF 08/28/2015, 08/10/2016  . Pneumococcal Conjugate-13 10/19/2016  . Pneumococcal Polysaccharide-23 11/16/2001  . Zoster 06/01/2013    Preventative care: Last colonoscopy: 2011 Mammogram: 2018 DEXA: 2009   Names of Other Physician/Practitioners you currently use: 1. [facility] here for primary care 2. Dr. Katy Fitch, eye doctor, last visit 2017 3. Dr. Eliezer Bottom , dentist,  last visit 2017 Patient Care Team: Unk Pinto, MD as PCP - General (Internal Medicine) Iran Planas, MD as Consulting Physician (Orthopedic Surgery) Ladene Artist, MD as Consulting Physician (Gastroenterology) Suella Broad, MD as Consulting Physician (Physical Medicine and Rehabilitation) Rolm Bookbinder, MD as Consulting Physician (Dermatology)  Surgical: She  has a past surgical history that includes Thyroid surgery; varicose veins stripped; Tonsillectomy; Appendectomy; Tubal ligation; Open reduction internal fixation (orif) distal radial fracture (Right, 01/10/2014); Colonoscopy; Esophagogastroduodenoscopy; Breast surgery (Right); Joint replacement (Left); Spinal cord decompression (06/05/2015); and Lumbar laminectomy/decompression microdiscectomy (N/A, 06/05/2015). Family Her family history includes Cancer in her brother; Heart disease in her sister; Hyperlipidemia in her mother; Hypertension in her mother. Social history  She reports that she has never smoked. She has never used smokeless tobacco. She reports that she does not drink alcohol or use drugs.  MEDICARE WELLNESS OBJECTIVES: Physical activity: Current Exercise Habits: Structured exercise class, Time (Minutes): 45, Frequency (Times/Week): 3, Weekly Exercise (Minutes/Week): 135, Intensity: Moderate Cardiac risk factors:  Cardiac Risk Factors include: advanced age (>25men, >35 women);dyslipidemia;obesity (BMI >30kg/m2);sedentary lifestyle;hypertension Depression/mood screen:   Depression screen [facility] 2/9 01/26/2017  Decreased Interest 0  Down, Depressed, Hopeless 0  PHQ - 2 Score 0    ADLs:  In your present state of health, do you have any difficulty performing the following activities: 01/26/2017 10/19/2016  Hearing? N N  Vision? N N  Difficulty concentrating or making decisions? N N  Walking or climbing stairs? N Y  Dressing or bathing? N N  Doing errands, shopping? N Y  Conservation officer, nature and eating ? N -  Using the Toilet? N  -  In the past six months, have you accidently leaked urine? N -  Do you have problems with loss of bowel control? N -  Managing your Medications? N -  Managing your Finances? N -  Housekeeping or managing your Housekeeping? N -  Some recent data might be hidden     Cognitive Testing  Alert? Yes  Normal Appearance?Yes  Oriented to person? Yes  Place? Yes   Time? Yes  Recall of three objects?  Yes  Can perform simple calculations? Yes  Displays appropriate judgment?Yes  Can read the correct time from a watch face?Yes  EOL planning: Does Patient Have a Medical Advance Directive?: Yes Type of Advance Directive: Healthcare Power of Attorney, Living will Copy of [place] in Chart?: No - copy requested   Objective:   Today's Vitals   01/26/17 1014  BP: 136/82  Pulse: 66  Resp: 16  Temp: 98.6 F (37 C)  TempSrc: Temporal  Weight: 170 lb (77.1 kg)  Height: 5\' 1"  (1.549 m)   Body mass index is 32.12 kg/m.  General appearance: alert, no distress, WD/WN, female HEENT: normocephalic, sclerae anicteric, TMs pearly, nares patent, no discharge or erythema, pharynx normal Oral cavity: MMM, no lesions Neck: supple, no lymphadenopathy, no thyromegaly, no masses Heart: RRR, normal S1, S2, no murmurs Lungs: CTA bilaterally, no wheezes, rhonchi, or rales Abdomen: +bs, soft, non tender, non distended, no masses, no hepatomegaly, no splenomegaly Musculoskeletal: nontender, no swelling, no obvious deformity Extremities: no edema, no cyanosis, no clubbing Pulses: 2+ symmetric, upper and lower extremities, normal cap refill Neurological: alert, oriented x 3, CN2-12 intact, strength normal upper extremities and lower extremities, sensation normal throughout, DTRs 2+ throughout, no cerebellar signs, gait normal Psychiatric: normal affect, behavior normal, pleasant   Medicare Attestation I have personally reviewed: The patient's medical and social history Their use of  alcohol, tobacco or illicit drugs Their current medications and supplements The patient's functional ability including ADLs,fall risks, home safety risks, cognitive, and hearing and visual impairment Diet and physical activities Evidence for depression or mood disorders  The patient's weight, height, BMI, and visual acuity have been recorded in the chart.  I have made referrals, counseling, and provided education to the patient based on review of the above and I have provided the patient with a written personalized care plan for preventive services.     Starlyn Skeans, PA-C   01/26/2017

## 2017-01-27 LAB — HEMOGLOBIN A1C
Hgb A1c MFr Bld: 6.1 % — ABNORMAL HIGH (ref <5.7)
Mean Plasma Glucose: 128 mg/dL

## 2017-04-01 ENCOUNTER — Telehealth: Payer: Self-pay | Admitting: *Deleted

## 2017-04-01 ENCOUNTER — Other Ambulatory Visit: Payer: Self-pay | Admitting: Internal Medicine

## 2017-04-01 NOTE — Telephone Encounter (Signed)
[facility] was informed that the Histussin [facility] [place] refill was denied per Dr Melford Aase.

## 2017-04-01 NOTE — Telephone Encounter (Signed)
A message was left to inform the patient to try Delsym OTC cough medication.  Per Dr Albina Billet, he cannot refill the Histussin HC.

## 2017-04-13 ENCOUNTER — Other Ambulatory Visit: Payer: Self-pay

## 2017-04-13 MED ORDER — PROPRANOLOL HCL ER 80 MG PO CP24: ORAL_CAPSULE | ORAL | 1 refills | Status: DC

## 2017-04-20 ENCOUNTER — Other Ambulatory Visit: Payer: Self-pay | Admitting: Internal Medicine

## 2017-04-21 ENCOUNTER — Other Ambulatory Visit: Payer: Self-pay | Admitting: Internal Medicine

## 2017-05-04 ENCOUNTER — Ambulatory Visit (INDEPENDENT_AMBULATORY_CARE_PROVIDER_SITE_OTHER): Payer: Medicare Other | Admitting: Physician Assistant

## 2017-05-04 VITALS — BP 140/84 | HR 62 | Temp 97.7°F | Resp 16 | Ht 61.0 in | Wt 170.8 lb

## 2017-05-04 DIAGNOSIS — E782 Mixed hyperlipidemia: Secondary | ICD-10-CM

## 2017-05-04 DIAGNOSIS — Z79899 Other long term (current) drug therapy: Secondary | ICD-10-CM | POA: Diagnosis not present

## 2017-05-04 DIAGNOSIS — I10 Essential (primary) hypertension: Secondary | ICD-10-CM

## 2017-05-04 DIAGNOSIS — E559 Vitamin D deficiency, unspecified: Secondary | ICD-10-CM | POA: Diagnosis not present

## 2017-05-04 DIAGNOSIS — R7303 Prediabetes: Secondary | ICD-10-CM | POA: Diagnosis not present

## 2017-05-04 LAB — HEPATIC FUNCTION PANEL
ALT: 23 U/L (ref 6–29)
AST: 26 U/L (ref 10–35)
Albumin: 4.1 g/dL (ref 3.6–5.1)
Alkaline Phosphatase: 74 U/L (ref 33–130)
Bilirubin, Direct: 0.1 mg/dL (ref <=0.2)
Indirect Bilirubin: 0.5 mg/dL (ref 0.2–1.2)
Total Bilirubin: 0.6 mg/dL (ref 0.2–1.2)
Total Protein: 6.3 g/dL (ref 6.1–8.1)

## 2017-05-04 LAB — CBC WITH DIFFERENTIAL/PLATELET
Basophils Absolute: 61 cells/uL (ref 0–200)
Basophils Relative: 1 %
Eosinophils Absolute: 183 cells/uL (ref 15–500)
Eosinophils Relative: 3 %
HCT: 40.9 % (ref 35.0–45.0)
Hemoglobin: 13.6 g/dL (ref 11.7–15.5)
Lymphocytes Relative: 28 %
Lymphs Abs: 1708 cells/uL (ref 850–3900)
MCH: 29.5 pg (ref 27.0–33.0)
MCHC: 33.3 g/dL (ref 32.0–36.0)
MCV: 88.7 fL (ref 80.0–100.0)
MPV: 10.6 fL (ref 7.5–12.5)
Monocytes Absolute: 671 cells/uL (ref 200–950)
Monocytes Relative: 11 %
Neutro Abs: 3477 cells/uL (ref 1500–7800)
Neutrophils Relative %: 57 %
Platelets: 208 10*3/uL (ref 140–400)
RBC: 4.61 MIL/uL (ref 3.80–5.10)
RDW: 15.3 % — ABNORMAL HIGH (ref 11.0–15.0)
WBC: 6.1 10*3/uL (ref 3.8–10.8)

## 2017-05-04 LAB — BASIC METABOLIC PANEL WITH GFR
BUN: 19 mg/dL (ref 7–25)
CO2: 27 mmol/L (ref 20–31)
Calcium: 9.3 mg/dL (ref 8.6–10.4)
Chloride: 104 mmol/L (ref 98–110)
Creat: 0.92 mg/dL — ABNORMAL HIGH (ref 0.60–0.88)
GFR, Est African American: 68 mL/min (ref >=60)
GFR, Est Non African American: 59 mL/min — ABNORMAL LOW (ref >=60)
Glucose, Bld: 113 mg/dL — ABNORMAL HIGH (ref 65–99)
Potassium: 3.9 mmol/L (ref 3.5–5.3)
Sodium: 141 mmol/L (ref 135–146)

## 2017-05-04 LAB — TSH: TSH: 0.85 mIU/L

## 2017-05-04 LAB — LIPID PANEL
Cholesterol: 198 mg/dL (ref <200)
HDL: 69 mg/dL (ref >50)
LDL Cholesterol: 114 mg/dL — ABNORMAL HIGH (ref <100)
Total CHOL/HDL Ratio: 2.9 Ratio (ref <5.0)
Triglycerides: 76 mg/dL (ref <150)
VLDL: 15 mg/dL (ref <30)

## 2017-05-04 NOTE — Patient Instructions (Signed)
Stop the calcium Decrease the vitamin C/stop for a while and get back on the 500mg    Simple math prevails.    1st - exercise does not produce significant weight loss - at best one converts fat into muscle , "bulks up", loses inches, but usually stays "weight neutral"     2nd - think of your body weightas a check book: If you eat more calories than you burn up - you save money or gain weight .... Or if you spend more money than you put in the check book, ie burn up more calories than you eat, then you lose weight     3rd - if you walk or run 1 mile, you burn up 100 calories - you have to burn up 3,500 calories to lose 1 pound, ie you have to walk/run 35 miles to lose 1 measly pound. So if you want to lose 10 #, then you have to walk/run 350 miles, so.... clearly exercise is not the solution.     4. So if you consume 1,500 calories, then you have to burn up the equivalent of 15 miles to stay weight neutral - It also stands to reason that if you consume 1,500 cal/day and don't lose weight, then you must be burning up about 1,500 cals/day to stay weight neutral.     5. If you really want to lose weight, you must cut your calorie intake 300 calories /day and at that rate you should lose about 1 # every 3 days.   6. Please purchase Dr Fara Olden Fuhrman's book(s) "The End of Dieting" & "Eat to Live" . It has some great concepts and recipes.

## 2017-05-04 NOTE — Progress Notes (Signed)
Assessment and Plan:   Hypertension -Continue medication, monitor blood pressure at home. Continue DASH diet.  Reminder to go to the ER if any CP, SOB, nausea, dizziness, severe HA, changes vision/speech, left arm numbness and tingling and jaw pain.  Cholesterol -Continue diet and exercise. Check cholesterol.    Prediabetes  -Continue diet and exercise. Check A1C  Vitamin D Def - check level and continue medications.   Dyspnea No accompaniments, continue bASA, medical therapy, declines referral at this time, check labs If any changes/accompaniments will go to ER, patient understands.   Continue diet and meds as discussed. Further disposition pending results of labs. Over 30 minutes of exam, counseling, chart review, and critical decision making was performed  Future Appointments Date Time Provider [place]  11/18/2017 10:00 AM Unk Pinto, MD [facility] None     HPI 83 y.o. female  presents for 3 month follow up on hypertension, cholesterol, prediabetes, and vitamin D deficiency.  Her blood pressure has been controlled at home, today their BP is BP: 140/84  She does workout, doing water aerobics which helps. She denies chest pain, dizziness.   She is not on cholesterol medication and denies myalgias. Her cholesterol is at goal. The cholesterol last visit was:   Lab Results  Component Value Date   CHOL 156 10/19/2016   HDL 52 10/19/2016   LDLCALC 81 10/19/2016   TRIG 117 10/19/2016   CHOLHDL 3.0 10/19/2016    She has been working on diet and exercise for prediabetes, and denies paresthesia of the feet, polydipsia, polyuria and visual disturbances. Last A1C in the office was:  Lab Results  Component Value Date   HGBA1C 6.1 (H) 01/26/2017   Patient is on Vitamin D supplement.   Lab Results  Component Value Date   VD25OH 73 10/19/2016     BMI is Body mass index is 32.27 kg/m., she is working on diet and exercise. Wt Readings from Last 3 Encounters:   05/04/17 170 lb 12.8 oz (77.5 kg)  01/26/17 170 lb (77.1 kg)  10/19/16 166 lb 6.4 oz (75.5 kg)     Current Medications:  Current Outpatient Prescriptions on File Prior to Visit  Medication Sig Dispense Refill  . acetaminophen (TYLENOL) 650 MG CR tablet Take 650 mg by mouth every 8 (eight) hours as needed for pain.    Marland Kitchen albuterol (PROVENTIL HFA;VENTOLIN HFA) 108 (90 BASE) MCG/ACT inhaler Inhale 2 puffs into the lungs every 6 (six) hours as needed for wheezing or shortness of breath. 1 Inhaler 2  . Ascorbic Acid (VITAMIN C) 1000 MG tablet Take 1,000 mg by mouth 2 (two) times daily.    Marland Kitchen aspirin 81 MG tablet Take 81 mg by mouth daily.    Marland Kitchen b complex vitamins tablet Take 1 tablet by mouth daily.    . bumetanide (BUMEX) 1 MG tablet TAKE ONE-HALF TO ONE TABLET BY MOUTH TWICE DAILY AS NEEDED FOR  SWELLING 90 tablet 1  . Calcium Carbonate (CALCIUM 600 PO) Take 600 mg by mouth every evening.    . Cholecalciferol (VITAMIN D3) 2000 UNITS TABS Take 2,000-4,000 Units by mouth 2 (two) times daily. Take 4000 units in the morning and 2000 units in the evening    . fluticasone (FLONASE) 50 MCG/ACT nasal spray Place 2 sprays into both nostrils daily. 16 g 0  . MELATONIN PO Take by mouth. Takes 1 at bedtime    . Misc Natural Products (OSTEO BI-FLEX JOINT SHIELD PO) Take 1 tablet by mouth 2 (two)  times daily.     . propranolol ER (INDERAL LA) 80 MG 24 hr capsule TAKE ONE CAPSULE BY MOUTH ONCE DAILY FOR TREMORS 90 capsule 1  . ranitidine (ZANTAC) 300 MG tablet TAKE ONE TABLET BY MOUTH TWICE DAILY FOR  ACID  REFLUX 180 tablet 1   No current facility-administered medications on file prior to visit.    Medical History:  Past Medical History:  Diagnosis Date  . Cancer ([place])    skin cancer  . DDD (degenerative disc disease)   . Diabetes mellitus without complication ([place])    "pre-diabetes"  . Diverticulitis   . DJD (degenerative joint disease)   . GERD (gastroesophageal reflux disease)    takes  ranitidine  . Heart murmur   . History of hiatal hernia   . History of pneumonia   . Hx of irritable bowel syndrome   . Hyperlipidemia   . Occasional tremors   . Osteoporosis   . Pre-diabetes   . Varicose veins   . Vitamin D deficiency    Allergies:  Allergies  Allergen Reactions  . Accupril [Quinapril Hcl] Cough  . Neosporin [Neomycin-Bacitracin Zn-Polymyx]   . Prednisone     Agitated and shaky  . Reglan [Metoclopramide] Other (See Comments)    tremors  . Tramadol Nausea And Vomiting  . Adhesive [Tape] Rash    Review of Systems:  Review of Systems  Constitutional: Negative.   HENT: Negative.   Eyes: Negative.   Respiratory: Negative for cough, hemoptysis, sputum production, shortness of breath and wheezing.   Cardiovascular: Negative.   Gastrointestinal: Negative.   Genitourinary: Negative.   Musculoskeletal: Negative.   Skin: Negative.   Neurological: Negative.   Endo/Heme/Allergies: Negative.   Psychiatric/Behavioral: Negative.     Family history- Review and unchanged Social history- Review and unchanged Physical Exam: BP 140/84   Pulse 62   Temp 97.7 F (36.5 C)   Resp 16   Ht 5\' 1"  (1.549 m)   Wt 170 lb 12.8 oz (77.5 kg)   BMI 32.27 kg/m  Wt Readings from Last 3 Encounters:  05/04/17 170 lb 12.8 oz (77.5 kg)  01/26/17 170 lb (77.1 kg)  10/19/16 166 lb 6.4 oz (75.5 kg)   General Appearance: Well nourished, in no apparent distress. Eyes: PERRLA, EOMs, conjunctiva no swelling or erythema Sinuses: No Frontal/maxillary tenderness ENT/Mouth: Ext aud canals clear, TMs without erythema, bulging. No erythema, swelling, or exudate on post pharynx.  Tonsils not swollen or erythematous. Hearing normal.  Neck: Supple, thyroid normal.  Respiratory: Respiratory effort normal, BS equal bilaterally without rales, rhonchi, wheezing or stridor.  Cardio: RRR with no MRGs. Brisk peripheral pulses without edema.  Abdomen: Soft, + BS,  Non tender, no guarding, rebound,  hernias, masses. Lymphatics: Non tender without lymphadenopathy.  Musculoskeletal: Full ROM, 5/5 strength, Normal gait Skin: Warm, dry without rashes, lesions, ecchymosis.  Neuro: Cranial nerves intact. Normal muscle tone, no cerebellar symptoms. Psych: Awake and oriented X 3, normal affect, Insight and Judgment appropriate.    Vicie Mutters, PA-C 11:56 AM [facility]

## 2017-05-05 LAB — HEMOGLOBIN A1C
Hgb A1c MFr Bld: 6.4 % — ABNORMAL HIGH (ref <5.7)
Mean Plasma Glucose: 137 mg/dL

## 2017-05-05 NOTE — Progress Notes (Signed)
LVM for pt to return office call for LAB results.

## 2017-05-05 NOTE — Progress Notes (Signed)
Pt aware of lab results & voiced understanding of those results.

## 2017-06-09 ENCOUNTER — Telehealth: Payer: Self-pay | Admitting: Internal Medicine

## 2017-06-09 ENCOUNTER — Other Ambulatory Visit: Payer: Self-pay | Admitting: Internal Medicine

## 2017-06-09 MED ORDER — DEXAMETHASONE 0.5 MG PO TABS: ORAL_TABLET | ORAL | 0 refills | Status: DC

## 2017-06-09 NOTE — Telephone Encounter (Signed)
Briana [name] had 2 days of right ear, facial to neck pain. Addressed with heat, DDS, and appt with ENT 07-02-17. Any further recommendations? Per Dr Melford Aase, called in rx for Decadron to [place]. Likely TMJ. Left voicemail for patient

## 2017-07-02 DIAGNOSIS — H903 Sensorineural hearing loss, bilateral: Secondary | ICD-10-CM | POA: Diagnosis not present

## 2017-07-15 DIAGNOSIS — L821 Other seborrheic keratosis: Secondary | ICD-10-CM | POA: Diagnosis not present

## 2017-07-15 DIAGNOSIS — D0462 Carcinoma in situ of skin of left upper limb, including shoulder: Secondary | ICD-10-CM | POA: Diagnosis not present

## 2017-07-15 DIAGNOSIS — Z85828 Personal history of other malignant neoplasm of skin: Secondary | ICD-10-CM | POA: Diagnosis not present

## 2017-07-15 DIAGNOSIS — L570 Actinic keratosis: Secondary | ICD-10-CM | POA: Diagnosis not present

## 2017-07-15 DIAGNOSIS — L820 Inflamed seborrheic keratosis: Secondary | ICD-10-CM | POA: Diagnosis not present

## 2017-08-08 NOTE — Progress Notes (Signed)
Assessment and Plan:   Essential hypertension - continue medications, DASH diet, exercise and monitor at home. Call if greater than 130/80.  -     CBC with Differential/Platelet -     BASIC METABOLIC PANEL WITH GFR -     Hepatic function panel -     TSH  Hyperlipidemia -continue medications, check lipids, decrease fatty foods, increase activity.  -     Lipid panel  Prediabetes -     Hemoglobin A1c  Medication management -     Magnesium  GERD Add on nexium x 2 weeks, continue zantac, and follow diet ? If contributing to SOB  Dyspnea, unspecified type X 3 months, CP x 1 month -     EKG 12-Lead -     Ambulatory referral to Cardiology - SOB with exertion and fatigue x 3 months, now CP non specific but with age and risk factors will refer to cardio for possible stress test/evaluation   Continue diet and meds as discussed. Further disposition pending results of labs. Over 30 minutes of exam, counseling, chart review, and critical decision making was performed  Future Appointments Date Time Provider [place]  11/18/2017 10:00 AM Unk Pinto, MD [facility] None     HPI 83 y.o. female  presents for 3 month follow up on hypertension, cholesterol, prediabetes, and vitamin D deficiency.  Her blood pressure has been controlled at home, today their BP is BP: (!) 142/80  She does workout, doing water aerobics which helps. She denies chest pain, dizziness.  She has been  Having SOB x 3 month with exertion to her mailbox, has to stop but no pain at that time, and left upper chest pain sharp at time, very specific area, no accompaniments, no radiation. Nothing better or worse with CP. Some cough, no wheezing, no fever or chills.   She is not on cholesterol medication and denies myalgias. Her cholesterol is at goal. The cholesterol last visit was:   Lab Results  Component Value Date   CHOL 198 05/04/2017   HDL 69 05/04/2017   LDLCALC 114 (H) 05/04/2017   TRIG 76 05/04/2017    CHOLHDL 2.9 05/04/2017    She has been working on diet and exercise for prediabetes, and denies paresthesia of the feet, polydipsia, polyuria and visual disturbances. Last A1C in the office was:  Lab Results  Component Value Date   HGBA1C 6.4 (H) 05/04/2017   Patient is on Vitamin D supplement.   Lab Results  Component Value Date   VD25OH 73 10/19/2016     BMI is Body mass index is 32.65 kg/m., she is working on diet and exercise. Wt Readings from Last 3 Encounters:  08/10/17 172 lb 12.8 oz (78.4 kg)  05/04/17 170 lb 12.8 oz (77.5 kg)  01/26/17 170 lb (77.1 kg)     Current Medications:  Current Outpatient Prescriptions on File Prior to Visit  Medication Sig Dispense Refill  . albuterol (PROVENTIL HFA;VENTOLIN HFA) 108 (90 BASE) MCG/ACT inhaler Inhale 2 puffs into the lungs every 6 (six) hours as needed for wheezing or shortness of breath. 1 Inhaler 2  . Ascorbic Acid (VITAMIN C) 1000 MG tablet Take 1,000 mg by mouth 2 (two) times daily.    Marland Kitchen aspirin 81 MG tablet Take 81 mg by mouth daily.    Marland Kitchen b complex vitamins tablet Take 1 tablet by mouth daily.    . bumetanide (BUMEX) 1 MG tablet TAKE ONE-HALF TO ONE TABLET BY MOUTH TWICE DAILY AS NEEDED  FOR  SWELLING 90 tablet 1  . Calcium Carbonate (CALCIUM 600 PO) Take 600 mg by mouth every evening.    . Cholecalciferol (VITAMIN D3) 2000 UNITS TABS Take 2,000-4,000 Units by mouth 2 (two) times daily. Take 4000 units in the morning and 2000 units in the evening    . fluticasone (FLONASE) 50 MCG/ACT nasal spray Place 2 sprays into both nostrils daily. 16 g 0  . MELATONIN PO Take by mouth. Takes 1 at bedtime    . Misc Natural Products (OSTEO BI-FLEX JOINT SHIELD PO) Take 1 tablet by mouth 2 (two) times daily.     . propranolol ER (INDERAL LA) 80 MG 24 hr capsule TAKE ONE CAPSULE BY MOUTH ONCE DAILY FOR TREMORS 90 capsule 1  . ranitidine (ZANTAC) 300 MG tablet TAKE ONE TABLET BY MOUTH TWICE DAILY FOR  ACID  REFLUX 180 tablet 1   No current  facility-administered medications on file prior to visit.    Medical History:  Past Medical History:  Diagnosis Date  . Cancer ([place])    skin cancer  . DDD (degenerative disc disease)   . Diabetes mellitus without complication ([place])    "pre-diabetes"  . Diverticulitis   . DJD (degenerative joint disease)   . GERD (gastroesophageal reflux disease)    takes ranitidine  . Heart murmur   . History of hiatal hernia   . History of pneumonia   . Hx of irritable bowel syndrome   . Hyperlipidemia   . Occasional tremors   . Osteoporosis   . Pre-diabetes   . Varicose veins   . Vitamin D deficiency    Allergies:  Allergies  Allergen Reactions  . Accupril [Quinapril Hcl] Cough  . Neosporin [Neomycin-Bacitracin Zn-Polymyx]   . Prednisone     Agitated and shaky  . Reglan [Metoclopramide] Other (See Comments)    tremors  . Tramadol Nausea And Vomiting  . Adhesive [Tape] Rash    Review of Systems:  Review of Systems  Constitutional: Positive for malaise/fatigue. Negative for chills, diaphoresis, fever and weight loss.  HENT: Negative.   Eyes: Negative.   Respiratory: Positive for cough and shortness of breath. Negative for hemoptysis, sputum production and wheezing.   Cardiovascular: Positive for chest pain. Negative for palpitations, orthopnea, claudication, leg swelling and PND.  Gastrointestinal: Positive for heartburn. Negative for abdominal pain, blood in stool, constipation, diarrhea, melena, nausea and vomiting.  Genitourinary: Negative.   Musculoskeletal: Negative.   Skin: Negative.   Neurological: Negative.  Negative for weakness.  Endo/Heme/Allergies: Negative.   Psychiatric/Behavioral: Negative.     Family history- Review and unchanged Social history- Review and unchanged Physical Exam: BP (!) 142/80   Pulse 62   Temp (!) 97.2 F (36.2 C)   Resp 14   Ht 5\' 1"  (1.549 m)   Wt 172 lb 12.8 oz (78.4 kg)   SpO2 95%   BMI 32.65 kg/m  Wt Readings from Last 3  Encounters:  08/10/17 172 lb 12.8 oz (78.4 kg)  05/04/17 170 lb 12.8 oz (77.5 kg)  01/26/17 170 lb (77.1 kg)   General Appearance: Well nourished, in no apparent distress. Eyes: PERRLA, EOMs, conjunctiva no swelling or erythema Sinuses: No Frontal/maxillary tenderness ENT/Mouth: Ext aud canals clear, TMs without erythema, bulging. No erythema, swelling, or exudate on post pharynx.  Tonsils not swollen or erythematous. Hearing normal.  Neck: Supple, thyroid normal.  Respiratory: Respiratory effort normal, BS equal bilaterally without rales, rhonchi, wheezing or stridor.  Cardio: RRR with no MRGs. Brisk  peripheral pulses without edema.  Abdomen: Soft, + BS,  Non tender, no guarding, rebound, hernias, masses. Lymphatics: Non tender without lymphadenopathy.  Musculoskeletal: Full ROM, 5/5 strength, Normal gait Skin: Warm, dry without rashes, lesions, ecchymosis.  Neuro: Cranial nerves intact. Normal muscle tone, no cerebellar symptoms. Psych: Awake and oriented X 3, normal affect, Insight and Judgment appropriate.    Vicie Mutters, PA-C 12:04 PM [facility]

## 2017-08-10 ENCOUNTER — Encounter: Payer: Self-pay | Admitting: Physician Assistant

## 2017-08-10 ENCOUNTER — Ambulatory Visit (INDEPENDENT_AMBULATORY_CARE_PROVIDER_SITE_OTHER): Payer: Medicare Other | Admitting: Physician Assistant

## 2017-08-10 VITALS — BP 142/80 | HR 62 | Temp 97.2°F | Resp 14 | Ht 61.0 in | Wt 172.8 lb

## 2017-08-10 DIAGNOSIS — E782 Mixed hyperlipidemia: Secondary | ICD-10-CM

## 2017-08-10 DIAGNOSIS — E559 Vitamin D deficiency, unspecified: Secondary | ICD-10-CM | POA: Diagnosis not present

## 2017-08-10 DIAGNOSIS — R7303 Prediabetes: Secondary | ICD-10-CM | POA: Diagnosis not present

## 2017-08-10 DIAGNOSIS — I10 Essential (primary) hypertension: Secondary | ICD-10-CM

## 2017-08-10 DIAGNOSIS — R079 Chest pain, unspecified: Secondary | ICD-10-CM

## 2017-08-10 DIAGNOSIS — R0600 Dyspnea, unspecified: Secondary | ICD-10-CM

## 2017-08-10 DIAGNOSIS — Z79899 Other long term (current) drug therapy: Secondary | ICD-10-CM | POA: Diagnosis not present

## 2017-08-10 DIAGNOSIS — K2100 Gastro-esophageal reflux disease with esophagitis, without bleeding: Secondary | ICD-10-CM

## 2017-08-10 NOTE — Patient Instructions (Addendum)
Can take nexium samples until they run out and then as needed for heart burn Take nexium in the AM and zantac at night then Continue the zantac 150mg  BID VERY IMPORTANT TO FOLLOW DIET BELOW  Avoid alcohol, spicy foods, NSAIDS (aleve, ibuprofen) at this time.  See foods below.   Go to the ER if any chest pain, shortness of breath, nausea, dizziness, severe HA, changes vision/speech  Please pick one of the over the counter allergy medications below and take it once daily for allergies.  Claritin or loratadine cheapest but likely the weakest  Zyrtec or certizine at night because it can make you sleepy The strongest is allegra or fexafinadine  Cheapest at [employer], [employer], [employer]   You can take tylenol (500mg ) or tylenol arthritis (650mg ) with the meloxicam/antiinflammatories. The max you can take of tylenol a day is 3000mg  daily, this is a max of 6 pills a day of the regular tyelnol (500mg ) or a max of 4 a day of the tylenol arthritis (650mg ) as long as no other medications you are taking contain tylenol.   Food Choices for Gastroesophageal Reflux Disease When you have gastroesophageal reflux disease (GERD), the foods you eat and your eating habits are very important. Choosing the right foods can help ease the discomfort of GERD. WHAT GENERAL GUIDELINES DO I NEED TO FOLLOW?  Choose fruits, vegetables, whole grains, low-fat dairy products, and low-fat meat, fish, and poultry.  Limit fats such as oils, salad dressings, butter, nuts, and avocado.  Keep a food diary to identify foods that cause symptoms.  Avoid foods that cause reflux. These may be different for different people.  Eat frequent small meals instead of three large meals each day.  Eat your meals slowly, in a relaxed setting.  Limit fried foods.  Cook foods using methods other than frying.  Avoid drinking alcohol.  Avoid drinking large amounts of liquids with your meals.  Avoid bending over or lying down until 2-3 hours  after eating. WHAT FOODS ARE NOT RECOMMENDED? The following are some foods and drinks that may worsen your symptoms: Vegetables Tomatoes. Tomato juice. Tomato and spaghetti sauce. Chili peppers. Onion and garlic. Horseradish. Fruits Oranges, grapefruit, and lemon (fruit and juice). Meats High-fat meats, fish, and poultry. This includes hot dogs, ribs, ham, sausage, salami, and bacon. Dairy Whole milk and chocolate milk. Sour cream. Cream. Butter. Ice cream. Cream cheese.  Beverages Coffee and tea, with or without caffeine. Carbonated beverages or energy drinks. Condiments Hot sauce. Barbecue sauce.  Sweets/Desserts Chocolate and cocoa. Donuts. Peppermint and spearmint. Fats and Oils High-fat foods, including [place] fries and potato chips. Other Vinegar. Strong spices, such as black pepper, white pepper, red pepper, cayenne, curry powder, cloves, ginger, and chili powder.

## 2017-08-11 LAB — HEMOGLOBIN A1C
Hgb A1c MFr Bld: 6.4 % of total Hgb — ABNORMAL HIGH (ref <5.7)
Mean Plasma Glucose: 137 (calc)
eAG (mmol/L): 7.6 (calc)

## 2017-08-11 LAB — CBC WITH DIFFERENTIAL/PLATELET
Basophils Absolute: 42 {cells}/uL (ref 0–200)
Basophils Relative: 0.6 %
Eosinophils Absolute: 224 {cells}/uL (ref 15–500)
Eosinophils Relative: 3.2 %
HCT: 41.7 % (ref 35.0–45.0)
Hemoglobin: 13.9 g/dL (ref 11.7–15.5)
Lymphs Abs: 1526 {cells}/uL (ref 850–3900)
MCH: 29.4 pg (ref 27.0–33.0)
MCHC: 33.3 g/dL (ref 32.0–36.0)
MCV: 88.3 fL (ref 80.0–100.0)
MPV: 11.3 fL (ref 7.5–12.5)
Monocytes Relative: 13.8 %
Neutro Abs: 4242 {cells}/uL (ref 1500–7800)
Neutrophils Relative %: 60.6 %
Platelets: 225 Thousand/uL (ref 140–400)
RBC: 4.72 Million/uL (ref 3.80–5.10)
RDW: 13 % (ref 11.0–15.0)
Total Lymphocyte: 21.8 %
WBC mixed population: 966 {cells}/uL — ABNORMAL HIGH (ref 200–950)
WBC: 7 Thousand/uL (ref 3.8–10.8)

## 2017-08-11 LAB — BASIC METABOLIC PANEL WITHOUT GFR
BUN/Creatinine Ratio: 17 (calc) (ref 6–22)
BUN: 16 mg/dL (ref 7–25)
CO2: 30 mmol/L (ref 20–32)
Calcium: 10.1 mg/dL (ref 8.6–10.4)
Chloride: 100 mmol/L (ref 98–110)
Creat: 0.96 mg/dL — ABNORMAL HIGH (ref 0.60–0.88)
GFR, Est African American: 64 mL/min/1.73m2
GFR, Est Non African American: 55 mL/min/1.73m2 — ABNORMAL LOW
Glucose, Bld: 108 mg/dL — ABNORMAL HIGH (ref 65–99)
Potassium: 4.3 mmol/L (ref 3.5–5.3)
Sodium: 139 mmol/L (ref 135–146)

## 2017-08-11 LAB — HEPATIC FUNCTION PANEL
AG Ratio: 1.8 (calc) (ref 1.0–2.5)
ALT: 15 U/L (ref 6–29)
AST: 22 U/L (ref 10–35)
Albumin: 4.2 g/dL (ref 3.6–5.1)
Alkaline phosphatase (APISO): 89 U/L (ref 33–130)
Bilirubin, Direct: 0.1 mg/dL (ref 0.0–0.2)
Globulin: 2.4 g/dL (ref 1.9–3.7)
Indirect Bilirubin: 0.5 mg/dL (ref 0.2–1.2)
Total Bilirubin: 0.6 mg/dL (ref 0.2–1.2)
Total Protein: 6.6 g/dL (ref 6.1–8.1)

## 2017-08-11 LAB — LIPID PANEL
Cholesterol: 195 mg/dL (ref <200)
HDL: 61 mg/dL (ref >50)
LDL Cholesterol (Calc): 112 mg/dL (calc) — ABNORMAL HIGH
Non-HDL Cholesterol (Calc): 134 mg/dL (calc) — ABNORMAL HIGH (ref <130)
Total CHOL/HDL Ratio: 3.2 (calc) (ref <5.0)
Triglycerides: 116 mg/dL (ref <150)

## 2017-08-11 LAB — MAGNESIUM: Magnesium: 2.2 mg/dL (ref 1.5–2.5)

## 2017-08-11 LAB — TSH: TSH: 0.99 mIU/L (ref 0.40–4.50)

## 2017-08-11 NOTE — Progress Notes (Signed)
LVM for pt to return office call for LAB results.

## 2017-08-12 DIAGNOSIS — L570 Actinic keratosis: Secondary | ICD-10-CM | POA: Diagnosis not present

## 2017-08-12 DIAGNOSIS — D0462 Carcinoma in situ of skin of left upper limb, including shoulder: Secondary | ICD-10-CM | POA: Diagnosis not present

## 2017-08-13 ENCOUNTER — Ambulatory Visit (INDEPENDENT_AMBULATORY_CARE_PROVIDER_SITE_OTHER): Payer: Medicare Other | Admitting: Cardiovascular Disease

## 2017-08-13 ENCOUNTER — Encounter: Payer: Self-pay | Admitting: Cardiovascular Disease

## 2017-08-13 VITALS — BP 173/81 | HR 63 | Ht 61.0 in | Wt 175.6 lb

## 2017-08-13 DIAGNOSIS — R079 Chest pain, unspecified: Secondary | ICD-10-CM

## 2017-08-13 DIAGNOSIS — E8881 Metabolic syndrome: Secondary | ICD-10-CM

## 2017-08-13 DIAGNOSIS — E669 Obesity, unspecified: Secondary | ICD-10-CM

## 2017-08-13 DIAGNOSIS — E785 Hyperlipidemia, unspecified: Secondary | ICD-10-CM | POA: Diagnosis not present

## 2017-08-13 DIAGNOSIS — I10 Essential (primary) hypertension: Secondary | ICD-10-CM

## 2017-08-13 NOTE — Patient Instructions (Signed)
Medication Instructions:  Your physician recommends that you continue on your current medications as directed. Please refer to the Current Medication list given to you today.  Testing/Procedures: Your physician has requested that you have an echocardiogram. Echocardiography is a painless test that uses sound waves to create images of your heart. It provides your doctor with information about the size and shape of your heart and how well your heart's chambers and valves are working. This procedure takes approximately one hour. There are no restrictions for this procedure.  This will be done at our [facility] location:  [place] has requested that you have a lexiscan myoview. For further information please visit [email or web address]. Please follow instruction sheet, as given.  Follow-Up: Your physician recommends that you schedule a follow-up appointment in: after testing with Dr. Claiborne Billings.   Any Other Special Instructions Will Be Listed Below (If Applicable).     If you need a refill on your cardiac medications before your next appointment, please call your pharmacy.

## 2017-08-13 NOTE — Progress Notes (Signed)
Cardiology Office Note    Date:  08/15/2017   ID:  ZITLALY MALSON, DOB 1934-01-20, MRN [id number]  PCP:  Briana Pinto, MD  Cardiologist:  Briana Majestic, MD     Chief Complaint  Patient presents with  . New Patient (Initial Visit)   Cardiology evaluation, referred through the courtesy of Briana Mutters, PA-C for evaluation of chest pain and exertional shortness of breath.  History of Present Illness:  Briana [name] is a 83 y.o. female who was recently been experiencing shortness of breath with exertion and fatigue with nonspecific chest pain.  She had seen Briana Mutters, PA-C and is now referred for cardiology evaluation.  Briana [name] has a history of hypertension, hyperlipidemia, varicose veins, status post surgery, and GERD.  Over the past several months she has noticed development of shortness of breath with exertion, fatigue, and has experienced some left upper chest pain radiating to her left shoulder which she describes as pins or sticking into her.  She is able to walk down.  Her long driveway without difficulty.  She denies any specific chest pain with walking and she walks 1 mile at times without difficulty.  She was recently evaluated by Briana [name] and because of this persistent increasing symptomatology in light of her age and risk factors she is now referred for cardiology evaluation and consideration for stress testing.  She denies PND, orthopnea.  She denies palpitations.  She denies presyncope or syncope.  Past Medical History:  Diagnosis Date  . Cancer ([place])    skin cancer  . DDD (degenerative disc disease)   . Diabetes mellitus without complication ([place])    "pre-diabetes"  . Diverticulitis   . DJD (degenerative joint disease)   . GERD (gastroesophageal reflux disease)    takes ranitidine  . Heart murmur   . History of hiatal hernia   . History of pneumonia   . Hx of irritable bowel syndrome   . Hyperlipidemia   . Occasional tremors   . Osteoporosis   .  Pre-diabetes   . Varicose veins   . Vitamin D deficiency     Past Surgical History:  Procedure Laterality Date  . APPENDECTOMY    . BREAST SURGERY Right    fluid drained from breast  . COLONOSCOPY    . ESOPHAGOGASTRODUODENOSCOPY    . JOINT REPLACEMENT Left    left hip  . LUMBAR LAMINECTOMY/DECOMPRESSION MICRODISCECTOMY N/A 06/05/2015   Procedure: L3-L5 DECOMPRESSION ;  Surgeon: Melina Schools, MD;  Location: [facility];  Service: Orthopedics;  Laterality: N/A;  . OPEN REDUCTION INTERNAL FIXATION (ORIF) DISTAL RADIAL FRACTURE Right 01/10/2014   Procedure: OPEN REDUCTION INTERNAL FIXATION (ORIF) DISTAL RADIAL FRACTURE;  Surgeon: Linna Hoff, MD;  Location: [facility];  Service: Orthopedics;  Laterality: Right;  . SPINAL CORD DECOMPRESSION  06/05/2015   L3 L 5  . THYROID SURGERY    . TONSILLECTOMY    . TUBAL LIGATION    . varicose veins stripped      Current Medications: Outpatient Medications Prior to Visit  Medication Sig Dispense Refill  . albuterol (PROVENTIL HFA;VENTOLIN HFA) 108 (90 BASE) MCG/ACT inhaler Inhale 2 puffs into the lungs every 6 (six) hours as needed for wheezing or shortness of breath. 1 Inhaler 2  . Ascorbic Acid (VITAMIN C) 1000 MG tablet Take 1,000 mg by mouth 2 (two) times daily.    Marland Kitchen aspirin 81 MG tablet Take 81 mg by mouth daily.    Marland Kitchen b complex vitamins tablet Take  1 tablet by mouth daily.    . bumetanide (BUMEX) 1 MG tablet TAKE ONE-HALF TO ONE TABLET BY MOUTH TWICE DAILY AS NEEDED FOR  SWELLING 90 tablet 1  . Calcium Carbonate (CALCIUM 600 PO) Take 600 mg by mouth every evening.    . Cholecalciferol (VITAMIN D3) 2000 UNITS TABS Take 2,000-4,000 Units by mouth 2 (two) times daily. Take 4000 units in the morning and 2000 units in the evening    . fluticasone (FLONASE) 50 MCG/ACT nasal spray Place 2 sprays into both nostrils daily. 16 g 0  . MELATONIN PO Take by mouth. Takes 1 at bedtime    . Misc Natural Products (OSTEO BI-FLEX JOINT SHIELD PO) Take 1 tablet by  mouth 2 (two) times daily.     . propranolol ER (INDERAL LA) 80 MG 24 hr capsule TAKE ONE CAPSULE BY MOUTH ONCE DAILY FOR TREMORS 90 capsule 1  . ranitidine (ZANTAC) 300 MG tablet TAKE ONE TABLET BY MOUTH TWICE DAILY FOR  ACID  REFLUX 180 tablet 1   No facility-administered medications prior to visit.      Allergies:   Accupril [quinapril hcl]; Neosporin [neomycin-bacitracin zn-polymyx]; Prednisone; Reglan [metoclopramide]; Tramadol; and Adhesive [tape]   Social History   Social History  . Marital status: Widowed    Spouse name: N/A  . Number of children: N/A  . Years of education: N/A   Social History Main Topics  . Smoking status: Never Smoker  . Smokeless tobacco: Never Used  . Alcohol use No  . Drug use: No  . Sexual activity: Not Asked   Other Topics Concern  . None   Social History Narrative  . None     Family History:  The patient's family history includes Cancer in her brother; Heart disease in her sister; Hyperlipidemia in her mother; Hypertension in her mother.   ROS General: Negative; No fevers, chills, or night sweats;  HEENT: Negative; Positive for wearing hearing aids.  No changes in vision, sinus congestion, difficulty swallowing Pulmonary: Positive for when necessary inhalation treatment with albuterol; No recent cough, wheezing, shortness of breath, hemoptysis Cardiovascular: See history of present illness GI: Negative; No nausea, vomiting, diarrhea, or abdominal pain GU: Negative; No dysuria, hematuria, or difficulty voiding Musculoskeletal: Negative; no myalgias, joint pain, or weakness Hematologic/Oncology: Negative; no easy bruising, bleeding Endocrine: Negative; no heat/cold intolerance; no diabetes Neuro: Negative; no changes in balance, headaches Skin: Negative; No rashes or skin lesions Psychiatric: Negative; No behavioral problems, depression Sleep: Negative; No snoring, daytime sleepiness, hypersomnolence, bruxism, restless legs, hypnogognic  hallucinations, no cataplexy Other comprehensive 14 point system review is negative.   PHYSICAL EXAM:   VS:  BP (!) 173/81   Pulse 63   Ht '5\' 1"'  (1.549 m)   Wt 175 lb 9.6 oz (79.7 kg)   BMI 33.18 kg/m     Repeat blood pressure by me 136/80  Wt Readings from Last 3 Encounters:  08/13/17 175 lb 9.6 oz (79.7 kg)  08/10/17 172 lb 12.8 oz (78.4 kg)  05/04/17 170 lb 12.8 oz (77.5 kg)    General: Alert, oriented, no distress.  Skin: normal turgor, no rashes, warm and dry HEENT: Normocephalic, atraumatic. Pupils equal round and reactive to light; sclera anicteric; extraocular muscles intact; Fundi No hemorrhages or exudates.  Disc flat Nose without nasal septal hypertrophy Mouth/Parynx benign; Mallinpatti scale 3 Neck: No JVD, no carotid bruits; normal carotid upstroke Lungs: clear to ausculatation and percussion; no wheezing or rales Chest wall: without tenderness to palpitation  Heart: PMI not displaced, RRR, s1 s2 normal, 1/6 systolic murmur, no diastolic murmur, no rubs, gallops, thrills, or heaves Abdomen: soft, nontender; no hepatosplenomehaly, BS+; abdominal aorta nontender and not dilated by palpation. Back: no CVA tenderness Pulses 2+ Musculoskeletal: full range of motion, normal strength, no joint deformities Extremities: no clubbing cyanosis or edema, Homan's sign negative  Neurologic: grossly nonfocal; Cranial nerves grossly wnl Psychologic: Normal mood and affect   Studies/Labs Reviewed:   EKG:  EKG is ordered today. Normal sinus rhythm at 63 bpm.  Incomplete right bundle branch block.  Poor anterior R-wave progression.  Normal intervals.  Recent Labs: BMP Latest Ref Rng & Units 08/10/2017 05/04/2017 01/26/2017  Glucose 65 - 99 mg/dL 108(H) 113(H) 103(H)  BUN 7 - 25 mg/dL '16 19 17  ' Creatinine 0.60 - 0.88 mg/dL 0.96(H) 0.92(H) 1.00(H)  BUN/Creat Ratio 6 - 22 (calc) 17 - -  Sodium 135 - 146 mmol/L 139 141 142  Potassium 3.5 - 5.3 mmol/L 4.3 3.9 3.9  Chloride 98 - 110  mmol/L 100 104 104  CO2 20 - 32 mmol/L '30 27 30  ' Calcium 8.6 - 10.4 mg/dL 10.1 9.3 9.1     Hepatic Function Latest Ref Rng & Units 08/10/2017 05/04/2017 01/26/2017  Total Protein 6.1 - 8.1 g/dL 6.6 6.3 6.3  Albumin 3.6 - 5.1 g/dL - 4.1 4.0  AST 10 - 35 U/L '22 26 19  ' ALT 6 - 29 U/L '15 23 11  ' Alk Phosphatase 33 - 130 U/L - 74 55  Total Bilirubin 0.2 - 1.2 mg/dL 0.6 0.6 0.6  Bilirubin, Direct 0.0 - 0.2 mg/dL 0.1 0.1 0.1    CBC Latest Ref Rng & Units 08/10/2017 05/04/2017 01/26/2017  WBC 3.8 - 10.8 Thousand/uL 7.0 6.1 8.4  Hemoglobin 11.7 - 15.5 g/dL 13.9 13.6 13.1  Hematocrit 35.0 - 45.0 % 41.7 40.9 41.1  Platelets 140 - 400 Thousand/uL 225 208 185   Lab Results  Component Value Date   MCV 88.3 08/10/2017   MCV 88.7 05/04/2017   MCV 90.3 01/26/2017   Lab Results  Component Value Date   TSH 0.99 08/10/2017   Lab Results  Component Value Date   HGBA1C 6.4 (H) 08/10/2017     BNP No results found for: BNP  ProBNP No results found for: PROBNP   Lipid Panel     Component Value Date/Time   CHOL 195 08/10/2017 1238   TRIG 116 08/10/2017 1238   HDL 61 08/10/2017 1238   CHOLHDL 3.2 08/10/2017 1238   VLDL 15 05/04/2017 1339   LDLCALC 114 (H) 05/04/2017 1339     RADIOLOGY: No results found.   Additional studies/ records that were reviewed today include:  I reviewed the records from Briana [name], [place]    ASSESSMENT:    1. Chest pain, unspecified type   2. Essential hypertension   3. Hyperlipidemia, unspecified hyperlipidemia type   4. Metabolic syndrome   5. Mild obesity      PLAN:  Ms. Kinnley Paulson is a very pleasant 83 year old female who has remained fairly active and at times can still even walk up to a mile.  She has a long-standing history of hypertension, history of hyperlipidemia, a history of prior varicose vein surgery, as well as GERD.  He also has taken albuterol on an as needed basis for intermittent wheezing.  She has a history of a hand tremor  for which she has been on extended release propranolol 80 mg.  She denies recent palpitations.  She  has taken ranitidine  for GERD.  She recently has developed a left-sided chest discomfort which has atypical features.  She describes a pin-like sensation which radiates to her shoulder.  This is not classically exertionally precipitated.  I suspect most likely this is musculoskeletal in etiology rather than primary cardiac.  She has a history of GERD, but it does not sound like reflux.  With her age and risk factors, I agree with assessing for CAD.  I do not believe she will be able to walk on a treadmill, particularly with an increasing grade.  As result, I will schedule her for Lexiscan Myoview study.  I will also schedule her for a 2-D echo Doppler study to evaluate for structural heart disease, systolic and diastolic function and further evaluate her soft cardiac murmur.  I reviewed the recent laboratory.  LDL was slightly increased at 114.  Hemoglobin A1c was 6.4 which is elevated and consistent with prediabetes/metabolic syndrome and is approaching overt diabetes.  Her blood pressure today is stable.  I reviewed the ECG from [facility] office and there is no change when compared to the EKG done today in our office.  I will see her back in follow-up of the above studies and further recommendations will be made at that time.   Medication Adjustments/Labs and Tests Ordered: Current medicines are reviewed at length with the patient today.  Concerns regarding medicines are outlined above.  Medication changes, Labs and Tests ordered today are listed in the Patient Instructions below. Patient Instructions  Medication Instructions:  Your physician recommends that you continue on your current medications as directed. Please refer to the Current Medication list given to you today.  Testing/Procedures: Your physician has requested that you have an echocardiogram. Echocardiography is a painless test that  uses sound waves to create images of your heart. It provides your doctor with information about the size and shape of your heart and how well your heart's chambers and valves are working. This procedure takes approximately one hour. There are no restrictions for this procedure.  This will be done at our [facility] location:  [place] has requested that you have a lexiscan myoview. For further information please visit [email or web address]. Please follow instruction sheet, as given.  Follow-Up: Your physician recommends that you schedule a follow-up appointment in: after testing with Dr. Claiborne Billings.   Any Other Special Instructions Will Be Listed Below (If Applicable).     If you need a refill on your cardiac medications before your next appointment, please call your pharmacy.      Signed, Briana Majestic, MD  08/15/2017 10:08 PM    [facility] [address], [place], [place], [place]  [address] Phone: [phone]

## 2017-08-17 ENCOUNTER — Telehealth (HOSPITAL_COMMUNITY): Payer: Self-pay

## 2017-08-17 NOTE — Telephone Encounter (Signed)
Encounter complete. 

## 2017-08-18 ENCOUNTER — Ambulatory Visit (HOSPITAL_COMMUNITY)
Admission: RE | Admit: 2017-08-18 | Discharge: 2017-08-18 | Disposition: A | Discharge status: Home / Self Care | Payer: Medicare Other | Source: Ambulatory Visit | Attending: Cardiovascular Disease | Admitting: Cardiovascular Disease

## 2017-08-18 DIAGNOSIS — R079 Chest pain, unspecified: Secondary | ICD-10-CM | POA: Diagnosis not present

## 2017-08-18 DIAGNOSIS — I10 Essential (primary) hypertension: Secondary | ICD-10-CM | POA: Diagnosis not present

## 2017-08-18 LAB — MYOCARDIAL PERFUSION IMAGING
LV dias vol: 88 mL (ref 46–106)
LV sys vol: 27 mL
Peak HR: 95 {beats}/min
Rest HR: 65 {beats}/min
SDS: 3
SRS: 5
SSS: 8
TID: 1.09

## 2017-08-18 MED ORDER — REGADENOSON 0.4 MG/5ML IV SOLN
0.4000 mg | Freq: Once | INTRAVENOUS | Status: AC
  Administered 2017-08-18: 0.4 mg via INTRAVENOUS

## 2017-08-18 MED ORDER — TECHNETIUM TC 99M TETROFOSMIN IV KIT
10.5000 | PACK | Freq: Once | INTRAVENOUS | Status: AC | PRN
  Administered 2017-08-18: 10.5 via INTRAVENOUS
  Filled 2017-08-18: qty 11

## 2017-08-18 MED ORDER — TECHNETIUM TC 99M TETROFOSMIN IV KIT
29.2000 | PACK | Freq: Once | INTRAVENOUS | Status: AC | PRN
  Administered 2017-08-18: 29.2 via INTRAVENOUS
  Filled 2017-08-18: qty 30

## 2017-08-19 NOTE — Progress Notes (Signed)
Pt aware of lab results & voiced understanding of those results.

## 2017-08-20 ENCOUNTER — Ambulatory Visit (HOSPITAL_COMMUNITY): Payer: Medicare Other | Attending: Cardiovascular Disease

## 2017-08-20 ENCOUNTER — Other Ambulatory Visit: Payer: Self-pay

## 2017-08-20 DIAGNOSIS — R7303 Prediabetes: Secondary | ICD-10-CM | POA: Diagnosis not present

## 2017-08-20 DIAGNOSIS — I119 Hypertensive heart disease without heart failure: Secondary | ICD-10-CM | POA: Diagnosis not present

## 2017-08-20 DIAGNOSIS — I10 Essential (primary) hypertension: Secondary | ICD-10-CM | POA: Diagnosis not present

## 2017-08-20 DIAGNOSIS — I080 Rheumatic disorders of both mitral and aortic valves: Secondary | ICD-10-CM | POA: Diagnosis not present

## 2017-08-20 DIAGNOSIS — E785 Hyperlipidemia, unspecified: Secondary | ICD-10-CM | POA: Diagnosis not present

## 2017-08-20 DIAGNOSIS — R079 Chest pain, unspecified: Secondary | ICD-10-CM | POA: Insufficient documentation

## 2017-08-31 ENCOUNTER — Encounter: Payer: Self-pay | Admitting: Cardiovascular Disease

## 2017-08-31 ENCOUNTER — Ambulatory Visit (INDEPENDENT_AMBULATORY_CARE_PROVIDER_SITE_OTHER): Payer: Medicare Other | Admitting: Cardiovascular Disease

## 2017-08-31 VITALS — BP 130/80 | HR 62 | Ht 61.0 in | Wt 175.4 lb

## 2017-08-31 DIAGNOSIS — R0789 Other chest pain: Secondary | ICD-10-CM | POA: Diagnosis not present

## 2017-08-31 DIAGNOSIS — Z79899 Other long term (current) drug therapy: Secondary | ICD-10-CM

## 2017-08-31 DIAGNOSIS — I10 Essential (primary) hypertension: Secondary | ICD-10-CM

## 2017-08-31 DIAGNOSIS — I519 Heart disease, unspecified: Secondary | ICD-10-CM | POA: Diagnosis not present

## 2017-08-31 DIAGNOSIS — E669 Obesity, unspecified: Secondary | ICD-10-CM

## 2017-08-31 DIAGNOSIS — I5189 Other ill-defined heart diseases: Secondary | ICD-10-CM

## 2017-08-31 NOTE — Progress Notes (Signed)
Cardiology Office Note    Date:  09/02/2017   ID:  Briana [name], DOB 1934/08/08, MRN [id number]  PCP:  Unk Pinto, MD  Cardiologist:  Shelva Majestic, MD     Chief Complaint  Patient presents with  . Follow-up   Cardiology evaluation, initially referred through the courtesy of Vicie Mutters, PA-C for evaluation of chest pain and exertional shortness of breath.  History of Present Illness:  Briana [name] is a 83 y.o. female who was recently been experiencing shortness of breath with exertion and fatigue with nonspecific chest pain.  She had seen Vicie Mutters, PA-C and was referred for cardiology evaluation. She presents today for one-month follow-up evaluation.  Briana [name] has a history of hypertension, hyperlipidemia, varicose veins, status post surgery, and GERD.  Over the past several months she has noticed development of shortness of breath with exertion, fatigue, and has experienced some left upper chest pain radiating to her left shoulder which she describes as pins or sticking into her.  She is able to walk down.  Her long driveway without difficulty.  She denies any specific chest pain with walking and she walks 1 mile at times without difficulty.  She was recently evaluated by Vicie Mutters and because of this persistent increasing symptomatology in light of her age and risk factors she was referred for cardiology evaluation and consideration for stress testing.  She underwent a nuclear stress test on 08/18/2017.  This was entirely normal and showed an EF of 70%, no ST segment changes and had normal myocardial perfusion without scar or ischemia.  A 2-D echo Doppler study on 08/20/2017 showed an EF of 60-65%.  There was grade 2 diastolic dysfunction.  She had mild aortic insufficiency, mild MR, trivial TR and incidentally a trivial pericardial effusion was identified.  Presently, she denies any recurrent chest pain suggestive of angina.  At times she notices some right-sided  tenderness in the region of the costochondral region.  She denies PND, orthopnea.  She denies palpitations.  She denies presyncope or syncope.  She presents for follow-up evaluation.  Past Medical History:  Diagnosis Date  . Cancer ([place])    skin cancer  . DDD (degenerative disc disease)   . Diabetes mellitus without complication ([place])    "pre-diabetes"  . Diverticulitis   . DJD (degenerative joint disease)   . GERD (gastroesophageal reflux disease)    takes ranitidine  . Heart murmur   . History of hiatal hernia   . History of pneumonia   . Hx of irritable bowel syndrome   . Hyperlipidemia   . Occasional tremors   . Osteoporosis   . Pre-diabetes   . Varicose veins   . Vitamin D deficiency     Past Surgical History:  Procedure Laterality Date  . APPENDECTOMY    . BREAST SURGERY Right    fluid drained from breast  . COLONOSCOPY    . ESOPHAGOGASTRODUODENOSCOPY    . JOINT REPLACEMENT Left    left hip  . LUMBAR LAMINECTOMY/DECOMPRESSION MICRODISCECTOMY N/A 06/05/2015   Procedure: L3-L5 DECOMPRESSION ;  Surgeon: Melina Schools, MD;  Location: [facility];  Service: Orthopedics;  Laterality: N/A;  . OPEN REDUCTION INTERNAL FIXATION (ORIF) DISTAL RADIAL FRACTURE Right 01/10/2014   Procedure: OPEN REDUCTION INTERNAL FIXATION (ORIF) DISTAL RADIAL FRACTURE;  Surgeon: Linna Hoff, MD;  Location: [facility];  Service: Orthopedics;  Laterality: Right;  . SPINAL CORD DECOMPRESSION  06/05/2015   L3 L 5  . THYROID SURGERY    .  TONSILLECTOMY    . TUBAL LIGATION    . varicose veins stripped      Current Medications: Outpatient Medications Prior to Visit  Medication Sig Dispense Refill  . albuterol (PROVENTIL HFA;VENTOLIN HFA) 108 (90 BASE) MCG/ACT inhaler Inhale 2 puffs into the lungs every 6 (six) hours as needed for wheezing or shortness of breath. 1 Inhaler 2  . Ascorbic Acid (VITAMIN C) 1000 MG tablet Take 1,000 mg by mouth 2 (two) times daily.    Marland Kitchen aspirin 81 MG tablet Take 81 mg by mouth  daily.    Marland Kitchen b complex vitamins tablet Take 1 tablet by mouth daily.    . bumetanide (BUMEX) 1 MG tablet TAKE ONE-HALF TO ONE TABLET BY MOUTH TWICE DAILY AS NEEDED FOR  SWELLING 90 tablet 1  . Calcium Carbonate (CALCIUM 600 PO) Take 600 mg by mouth every evening.    . Cholecalciferol (VITAMIN D3) 2000 UNITS TABS Take 2,000-4,000 Units by mouth 2 (two) times daily. Take 4000 units in the morning and 2000 units in the evening    . fluticasone (FLONASE) 50 MCG/ACT nasal spray Place 2 sprays into both nostrils daily. 16 g 0  . MELATONIN PO Take by mouth. Takes 1 at bedtime    . Misc Natural Products (OSTEO BI-FLEX JOINT SHIELD PO) Take 1 tablet by mouth 2 (two) times daily.     . propranolol ER (INDERAL LA) 80 MG 24 hr capsule TAKE ONE CAPSULE BY MOUTH ONCE DAILY FOR TREMORS 90 capsule 1  . ranitidine (ZANTAC) 300 MG tablet TAKE ONE TABLET BY MOUTH TWICE DAILY FOR  ACID  REFLUX 180 tablet 1   No facility-administered medications prior to visit.      Allergies:   Accupril [quinapril hcl]; Neosporin [neomycin-bacitracin zn-polymyx]; Prednisone; Reglan [metoclopramide]; Tramadol; and Adhesive [tape]   Social History   Social History  . Marital status: Widowed    Spouse name: N/A  . Number of children: N/A  . Years of education: N/A   Social History Main Topics  . Smoking status: Never Smoker  . Smokeless tobacco: Never Used  . Alcohol use No  . Drug use: No  . Sexual activity: Not Asked   Other Topics Concern  . None   Social History Narrative  . None     Family History:  The patient's family history includes Cancer in her brother; Heart disease in her sister; Hyperlipidemia in her mother; Hypertension in her mother.   ROS General: Negative; No fevers, chills, or night sweats;  HEENT: Negative; Positive for wearing hearing aids.  No changes in vision, sinus congestion, difficulty swallowing Pulmonary: Positive for when necessary inhalation treatment with albuterol; No recent cough,  wheezing, shortness of breath, hemoptysis Cardiovascular: See history of present illness GI: Negative; No nausea, vomiting, diarrhea, or abdominal pain GU: Negative; No dysuria, hematuria, or difficulty voiding Musculoskeletal: Negative; no myalgias, joint pain, or weakness Hematologic/Oncology: Negative; no easy bruising, bleeding Endocrine: Negative; no heat/cold intolerance; no diabetes Neuro: Negative; no changes in balance, headaches Skin: Negative; No rashes or skin lesions Psychiatric: Negative; No behavioral problems, depression Sleep: Negative; No snoring, daytime sleepiness, hypersomnolence, bruxism, restless legs, hypnogognic hallucinations, no cataplexy Other comprehensive 14 point system review is negative.   PHYSICAL EXAM:   VS:  BP 130/80   Pulse 62   Ht '5\' 1"'  (1.549 m)   Wt 175 lb 6.4 oz (79.6 kg)   BMI 33.14 kg/m     Repeat blood pressure was 144/80  Wt Readings from  Last 3 Encounters:  08/31/17 175 lb 6.4 oz (79.6 kg)  08/18/17 175 lb (79.4 kg)  08/13/17 175 lb 9.6 oz (79.7 kg)    General: Alert, oriented, no distress.  Skin: normal turgor, no rashes, warm and dry HEENT: Normocephalic, atraumatic. Pupils equal round and reactive to light; sclera anicteric; extraocular muscles intact; Nose without nasal septal hypertrophy Mouth/Parynx benign; Mallinpatti scale 3 Neck: No JVD, no carotid bruits; normal carotid upstroke Lungs: clear to ausculatation and percussion; no wheezing or rales Chest wall: Minimal right-sided costochondral tenderness. Heart: PMI not displaced, RRR, s1 s2 normal, 1/6 systolic murmur, no diastolic murmur, no rubs, gallops, thrills, or heaves Abdomen: soft, nontender; no hepatosplenomehaly, BS+; abdominal aorta nontender and not dilated by palpation. Back: no CVA tenderness Pulses 2+ Musculoskeletal: full range of motion, normal strength, no joint deformities Extremities: no clubbing cyanosis or edema, Homan's sign negative  Neurologic:  grossly nonfocal; Cranial nerves grossly wnl Psychologic: Normal mood and affect    Studies/Labs Reviewed:   EKG:  EKG is ordered today.  Normal sinus rhythm at 62 bpm with PAC.  Incomplete right bundle branch block.  Normal intervals  September 2018 ECG (independently read by me):Normal sinus rhythm at 63 bpm.  Incomplete right bundle branch block.  Poor anterior R-wave progression.  Normal intervals.  Recent Labs: BMP Latest Ref Rng & Units 08/10/2017 05/04/2017 01/26/2017  Glucose 65 - 99 mg/dL 108(H) 113(H) 103(H)  BUN 7 - 25 mg/dL '16 19 17  ' Creatinine 0.60 - 0.88 mg/dL 0.96(H) 0.92(H) 1.00(H)  BUN/Creat Ratio 6 - 22 (calc) 17 - -  Sodium 135 - 146 mmol/L 139 141 142  Potassium 3.5 - 5.3 mmol/L 4.3 3.9 3.9  Chloride 98 - 110 mmol/L 100 104 104  CO2 20 - 32 mmol/L '30 27 30  ' Calcium 8.6 - 10.4 mg/dL 10.1 9.3 9.1     Hepatic Function Latest Ref Rng & Units 08/10/2017 05/04/2017 01/26/2017  Total Protein 6.1 - 8.1 g/dL 6.6 6.3 6.3  Albumin 3.6 - 5.1 g/dL - 4.1 4.0  AST 10 - 35 U/L '22 26 19  ' ALT 6 - 29 U/L '15 23 11  ' Alk Phosphatase 33 - 130 U/L - 74 55  Total Bilirubin 0.2 - 1.2 mg/dL 0.6 0.6 0.6  Bilirubin, Direct 0.0 - 0.2 mg/dL 0.1 0.1 0.1    CBC Latest Ref Rng & Units 08/10/2017 05/04/2017 01/26/2017  WBC 3.8 - 10.8 Thousand/uL 7.0 6.1 8.4  Hemoglobin 11.7 - 15.5 g/dL 13.9 13.6 13.1  Hematocrit 35.0 - 45.0 % 41.7 40.9 41.1  Platelets 140 - 400 Thousand/uL 225 208 185   Lab Results  Component Value Date   MCV 88.3 08/10/2017   MCV 88.7 05/04/2017   MCV 90.3 01/26/2017   Lab Results  Component Value Date   TSH 0.99 08/10/2017   Lab Results  Component Value Date   HGBA1C 6.4 (H) 08/10/2017     BNP No results found for: BNP  ProBNP No results found for: PROBNP   Lipid Panel     Component Value Date/Time   CHOL 195 08/10/2017 1238   TRIG 116 08/10/2017 1238   HDL 61 08/10/2017 1238   CHOLHDL 3.2 08/10/2017 1238   VLDL 15 05/04/2017 1339   LDLCALC 114 (H)  05/04/2017 1339     RADIOLOGY: No results found.   Additional studies/ records that were reviewed today include:  I reviewed the records from Vicie Mutters, [place]    ASSESSMENT:    1. Chest pain, musculoskeletal  2. Essential hypertension   3. Mild obesity   4. Grade II diastolic dysfunction   5. Medication management      PLAN:  Briana [name] is a very pleasant 83 year old female who has remained fairly active and often can still  walk up to a mile.  She has a long-standing history of hypertension, history of hyperlipidemia, a history of prior varicose vein surgery, as well as GERD.  He also has taken albuterol on an as needed basis for intermittent wheezing.  She has a history of a hand tremor for which she has been on extended release propranolol 80 mg.  She denies recent palpitations.  She has taken ranitidine  for GERD.  She recently has developed a left-sided chest discomfort which has atypical features.  She describes a pin-like sensation which radiates to her shoulder.  Her pain was nonexertional.  In light of her age and risk factors, she underwent evaluation with an exercise Myoview study, which was entirely normal.  Post-rest ejection fraction was 70%.  Her echo Doppler confirms normal systolic function with an EF of 60-65% without wall motion abnormalities but demonstrated grade 2 diastolic dysfunction and tissue Doppler was consistent with high ventricular filling pressure.  She had mild MR, mild AR, trivial TR, and a trivial pericardial effusion.  She has noticed some intermittent right sided costochondral tenderness which is musculoskeletal.  I do not believe for chest pain is ischemic in etiology and I reassured her, based on her results of her tests.  She has been taking Bumex a half a pill twice a for swelling.  She denies any swelling, but as result of her evening dose she has been having to urinate at least 2-3 times per night.  I have suggested that she take 1 mg in  the morning.  She continues to be on Inderal LA 80 mg for her tremor.  Her blood pressure today is controlled but slightly elevated on repeat by me.  She will monitor her blood pressure at home.Marland Kitchen  She will return to the care of Dr. Melford Aase.  I will be available in the future if recurrent problems arise.  Medication Adjustments/Labs and Tests Ordered: Current medicines are reviewed at length with the patient today.  Concerns regarding medicines are outlined above.  Medication changes, Labs and Tests ordered today are listed in the Patient Instructions below. Patient Instructions  Follow-Up: Your physician recommends that you schedule a follow-up appointment: As needed with Dr. Claiborne Billings.         Signed, Shelva Majestic, MD  09/02/2017 6:07 PM    [facility] [address] [place], [place], [place]  [address] Phone: [phone]

## 2017-08-31 NOTE — Patient Instructions (Signed)
Follow-Up: Your physician recommends that you schedule a follow-up appointment: As needed with Dr. Claiborne Billings.

## 2017-10-11 ENCOUNTER — Other Ambulatory Visit: Payer: Self-pay | Admitting: Physician Assistant

## 2017-10-28 DIAGNOSIS — Z85828 Personal history of other malignant neoplasm of skin: Secondary | ICD-10-CM | POA: Diagnosis not present

## 2017-10-28 DIAGNOSIS — L910 Hypertrophic scar: Secondary | ICD-10-CM | POA: Diagnosis not present

## 2017-10-29 DIAGNOSIS — H04123 Dry eye syndrome of bilateral lacrimal glands: Secondary | ICD-10-CM | POA: Diagnosis not present

## 2017-10-29 DIAGNOSIS — H25813 Combined forms of age-related cataract, bilateral: Secondary | ICD-10-CM | POA: Diagnosis not present

## 2017-10-29 DIAGNOSIS — E119 Type 2 diabetes mellitus without complications: Secondary | ICD-10-CM | POA: Diagnosis not present

## 2017-10-29 DIAGNOSIS — H353131 Nonexudative age-related macular degeneration, bilateral, early dry stage: Secondary | ICD-10-CM | POA: Diagnosis not present

## 2017-10-29 LAB — HM DIABETES EYE EXAM

## 2017-11-18 ENCOUNTER — Encounter: Payer: Self-pay | Admitting: Internal Medicine

## 2017-11-18 ENCOUNTER — Other Ambulatory Visit: Payer: Self-pay | Admitting: *Deleted

## 2017-11-18 ENCOUNTER — Ambulatory Visit: Payer: Medicare Other | Admitting: Internal Medicine

## 2017-11-18 VITALS — BP 140/84 | HR 68 | Temp 97.8°F | Resp 16 | Ht 61.5 in | Wt 172.2 lb

## 2017-11-18 DIAGNOSIS — Z0000 Encounter for general adult medical examination without abnormal findings: Secondary | ICD-10-CM

## 2017-11-18 DIAGNOSIS — I10 Essential (primary) hypertension: Secondary | ICD-10-CM

## 2017-11-18 DIAGNOSIS — Z136 Encounter for screening for cardiovascular disorders: Secondary | ICD-10-CM

## 2017-11-18 DIAGNOSIS — E782 Mixed hyperlipidemia: Secondary | ICD-10-CM

## 2017-11-18 DIAGNOSIS — R7303 Prediabetes: Secondary | ICD-10-CM | POA: Diagnosis not present

## 2017-11-18 DIAGNOSIS — M810 Age-related osteoporosis without current pathological fracture: Secondary | ICD-10-CM

## 2017-11-18 DIAGNOSIS — R7309 Other abnormal glucose: Secondary | ICD-10-CM | POA: Diagnosis not present

## 2017-11-18 DIAGNOSIS — K2100 Gastro-esophageal reflux disease with esophagitis, without bleeding: Secondary | ICD-10-CM

## 2017-11-18 DIAGNOSIS — Z0001 Encounter for general adult medical examination with abnormal findings: Secondary | ICD-10-CM

## 2017-11-18 DIAGNOSIS — Z79899 Other long term (current) drug therapy: Secondary | ICD-10-CM | POA: Diagnosis not present

## 2017-11-18 DIAGNOSIS — E559 Vitamin D deficiency, unspecified: Secondary | ICD-10-CM

## 2017-11-18 DIAGNOSIS — Z1211 Encounter for screening for malignant neoplasm of colon: Secondary | ICD-10-CM

## 2017-11-18 DIAGNOSIS — Z1212 Encounter for screening for malignant neoplasm of rectum: Secondary | ICD-10-CM

## 2017-11-18 MED ORDER — BUMETANIDE 1 MG PO TABS: ORAL_TABLET | ORAL | 1 refills | Status: DC

## 2017-11-18 NOTE — Patient Instructions (Signed)

## 2017-11-18 NOTE — Progress Notes (Signed)
[name] Briana [name], M.D.     [name]. Silverio Lay, P.A.-C [name], [facility] [address] [place] [address] Telephone [phone] Telefax [phone] Annual Screening/Preventative Visit & Comprehensive Evaluation &  Examination     This very nice 84 y.o. [facility] presents for a Screening/Preventative Visit & comprehensive evaluation and management of multiple medical co-morbidities.  Patient has been followed for HTN, T2_NIDDM  Prediabetes, Hyperlipidemia and Vitamin D Deficiency. Patient's GERD is controlled with her meds.Patient is also followed by Dr Nelva Bush with [facility] for Lumbar spinal stenosis and had L3/L5 SS decompression surg in July 2016 by Dr Apolonio Schneiders. She also has hereditary or essential tremors benefited by her Propanolol which she takes also for her HTN.       HTN predates since 1996. Patient's BP has been controlled at home and patient denies any cardiac symptoms as chest pain, palpitations, shortness of breath, dizziness or ankle swelling. Today's BP is at goal - 140/84.      Patient's hyperlipidemia is not controlled with diet. Last lipids were not at goal: Lab Results  Component Value Date   CHOL 195 08/10/2017   HDL 61 08/10/2017   LDLCALC 114 (H) 05/04/2017   TRIG 116 08/10/2017   CHOLHDL 3.2 08/10/2017      Patient has  Morbid Obesity (BMI 31+) and T2_NIDDM (A1c 6.6%/2011) which she is attempting to control w/diet and patient denies reactive hypoglycemic symptoms, visual blurring, diabetic polys, or paresthesias. Last A1c was not at goal: Lab Results  Component Value Date   HGBA1C 6.4 (H) 08/10/2017      Finally, patient has history of Vitamin D Deficiency ("23"/2008)  and last Vitamin D was at goal Lab Results  Component Value Date   VD25OH 73 10/19/2016   Current Outpatient Medications on File Prior to Visit  Medication Sig  . albuterol (PROVENTIL HFA;VENTOLIN HFA) 108  (90 BASE) MCG/ACT inhaler Inhale 2 puffs into the lungs every 6 (six) hours as needed for wheezing or shortness of breath.  . Ascorbic Acid (VITAMIN C) 1000 MG tablet Take 1,000 mg by mouth 2 (two) times daily.  Marland Kitchen aspirin 81 MG tablet Take 81 mg by mouth daily.  Marland Kitchen b complex vitamins tablet Take 1 tablet by mouth daily.  . Calcium Carbonate (CALCIUM 600 PO) Take 600 mg by mouth every evening.  . Cholecalciferol (VITAMIN D3) 2000 UNITS TABS Take 2,000-4,000 Units by mouth 2 (two) times daily. Take 4000 units in the morning and 2000 units in the evening  . fluticasone (FLONASE) 50 MCG/ACT nasal spray Place 2 sprays into both nostrils daily.  Marland Kitchen MELATONIN PO Take by mouth. Takes 1 at bedtime  . Misc Natural Products (OSTEO BI-FLEX JOINT SHIELD PO) Take 1 tablet by mouth 2 (two) times daily.   . propranolol ER (INDERAL LA) 80 MG 24 hr capsule TAKE 1 CAPSULE BY MOUTH ONCE DAILY FOR TREMORS  . ranitidine (ZANTAC) 300 MG tablet TAKE ONE TABLET BY MOUTH TWICE DAILY FOR  ACID  REFLUX   No current facility-administered medications on file prior to visit.    Allergies  Allergen Reactions  . Accupril [Quinapril Hcl] Cough  . Neosporin [Neomycin-Bacitracin Zn-Polymyx]   . Prednisone     Agitated and shaky  . Reglan [Metoclopramide] Other (See Comments)    tremors  . Tramadol Nausea And Vomiting  . Adhesive [Tape] Rash   Past Medical History:  Diagnosis Date  . Cancer ([place])  skin cancer  . DDD (degenerative disc disease)   . Diabetes mellitus without complication ([place])    "pre-diabetes"  . Diverticulitis   . DJD (degenerative joint disease)   . GERD (gastroesophageal reflux disease)    takes ranitidine  . Heart murmur   . History of hiatal hernia   . History of pneumonia   . Hx of irritable bowel syndrome   . Hyperlipidemia   . Occasional tremors   . Osteoporosis   . Pre-diabetes   . Varicose veins   . Vitamin D deficiency    Health Maintenance  Topic Date Due  . INFLUENZA VACCINE   07/21/2018 (Originally 06/16/2017)  . TETANUS/TDAP  09/04/2025  . DEXA SCAN  Completed  . PNA vac Low Risk Adult  Completed   Immunization History  Administered Date(s) Administered  . DT 09/05/2015  . DTaP 11/16/2004  . Influenza Whole 08/30/2013  . Influenza, High Dose Seasonal PF 08/28/2015, 08/10/2016  . Pneumococcal Conjugate-13 10/19/2016  . Pneumococcal Polysaccharide-23 11/16/2001  . Zoster 06/01/2013   Last Colon -  Last Pap -  Past Surgical History:  Procedure Laterality Date  . APPENDECTOMY    . BREAST SURGERY Right    fluid drained from breast  . COLONOSCOPY    . ESOPHAGOGASTRODUODENOSCOPY    . JOINT REPLACEMENT Left    left hip  . LUMBAR LAMINECTOMY/DECOMPRESSION MICRODISCECTOMY N/A 06/05/2015   Procedure: L3-L5 DECOMPRESSION ;  Surgeon: Melina Schools, MD;  Location: [facility];  Service: Orthopedics;  Laterality: N/A;  . OPEN REDUCTION INTERNAL FIXATION (ORIF) DISTAL RADIAL FRACTURE Right 01/10/2014   Procedure: OPEN REDUCTION INTERNAL FIXATION (ORIF) DISTAL RADIAL FRACTURE;  Surgeon: Linna Hoff, MD;  Location: [facility];  Service: Orthopedics;  Laterality: Right;  . SPINAL CORD DECOMPRESSION  06/05/2015   L3 L 5  . THYROID SURGERY    . TONSILLECTOMY    . TUBAL LIGATION    . varicose veins stripped     Family History  Problem Relation Age of Onset  . Hypertension Mother   . Hyperlipidemia Mother   . Heart disease Sister   . Cancer Brother        prostate   Social History   Tobacco Use  . Smoking status: Never Smoker  . Smokeless tobacco: Never Used  Substance Use Topics  . Alcohol use: No  . Drug use: No    ROS Constitutional: Denies fever, chills, weight loss/gain, headaches, insomnia,  night sweats, and change in appetite. Does c/o fatigue. Eyes: Denies redness, blurred vision, diplopia, discharge, itchy, watery eyes.  ENT: Denies discharge, congestion, post nasal drip, epistaxis, sore throat, earache, hearing loss, dental pain, Tinnitus, Vertigo, Sinus  pain, snoring.  Cardio: Denies chest pain, palpitations, irregular heartbeat, syncope, dyspnea, diaphoresis, orthopnea, PND, claudication, edema Respiratory: denies cough, dyspnea, DOE, pleurisy, hoarseness, laryngitis, wheezing.  Gastrointestinal: Denies dysphagia, heartburn, reflux, water brash, pain, cramps, nausea, vomiting, bloating, diarrhea, constipation, hematemesis, melena, hematochezia, jaundice, hemorrhoids Genitourinary: Denies dysuria, frequency, urgency, nocturia, hesitancy, discharge, hematuria, flank pain Breast: Breast lumps, nipple discharge, bleeding.  Musculoskeletal: Denies arthralgia, myalgia, stiffness, Jt. Swelling, pain, limp, and strain/sprain. Denies falls. Skin: Denies puritis, rash, hives, warts, acne, eczema, changing in skin lesion Neuro: No weakness, tremor, incoordination, spasms, paresthesia, pain Psychiatric: Denies confusion, memory loss, sensory loss. Denies Depression. Endocrine: Denies change in weight, skin, hair change, nocturia, and paresthesia, diabetic polys, visual blurring, hyper / hypo glycemic episodes.  Heme/Lymph: No excessive bleeding, bruising, enlarged lymph nodes.  Physical Exam  BP 140/84  Pulse 68   Temp 97.8 F (36.6 C)   Resp 16   Ht 5' 1.5" (1.562 m)   Wt 172 lb 3.2 oz (78.1 kg)   BMI 32.01 kg/m   General Appearance: Well nourished, well groomed and in no apparent distress.  Eyes: PERRLA, EOMs, conjunctiva no swelling or erythema, normal fundi and vessels. Sinuses: No frontal/maxillary tenderness ENT/Mouth: EACs patent / TMs  nl. Nares clear without erythema, swelling, mucoid exudates. Oral hygiene is good. No erythema, swelling, or exudate. Tongue normal, non-obstructing. Tonsils not swollen or erythematous. Hearing normal.  Neck: Supple, thyroid normal. No bruits, nodes or JVD. Respiratory: Respiratory effort normal.  BS equal and clear bilateral without rales, rhonci, wheezing or stridor. Cardio: Heart sounds are normal  with regular rate and rhythm and no murmurs, rubs or gallops. Peripheral pulses are normal and equal bilaterally without edema. No aortic or femoral bruits. Chest: symmetric with normal excursions and percussion. Breasts: Symmetric, without lumps, nipple discharge, retractions, or fibrocystic changes.  Abdomen: Flat, soft with bowel sounds active. Nontender, no guarding, rebound, hernias, masses, or organomegaly.  Lymphatics: Non tender without lymphadenopathy.  Genitourinary:  Musculoskeletal: Full ROM all peripheral extremities, joint stability, 5/5 strength, and normal gait. Skin: Warm and dry without rashes, lesions, cyanosis, clubbing or  ecchymosis.  Neuro: Cranial nerves intact, reflexes equal bilaterally. Normal muscle tone, no cerebellar symptoms. Sensation intact.  Pysch: Alert and oriented X 3, normal affect, Insight and Judgment appropriate.   Assessment and Plan  1. Annual Preventative Screening Examination  2. Essential hypertension  - EKG 12-Lead - Urinalysis, Routine w reflex microscopic - Microalbumin / creatinine urine ratio - CBC with Differential/Platelet - BASIC METABOLIC PANEL WITH GFR - Magnesium - TSH  3. Hyperlipidemia, mixed  - EKG 12-Lead - Hepatic function panel - Lipid panel - TSH  4. Prediabetes  - EKG 12-Lead - Hemoglobin A1c - Insulin, random  5. Vitamin D deficiency  - VITAMIN D 25 Hydroxy   6. GERD   7. Abnormal glucose  - Hemoglobin A1c - Insulin, random  8. Age-related osteoporosis without current pathological fracture   9. Screening for colorectal cancer  - POC Hemoccult Bld/Stl   10. Screening for ischemic heart disease  - EKG 12-Lead  11. Medication management  - Urinalysis, Routine w reflex microscopic - Microalbumin / creatinine urine ratio - CBC with Differential/Platelet - BASIC METABOLIC PANEL WITH GFR - Hepatic function panel - Magnesium - Lipid panel - TSH - Hemoglobin A1c - Insulin, random -  VITAMIN D 25 Hydroxy         Patient was counseled in prudent diet to achieve/maintain BMI less than 25 for weight control, BP monitoring, regular exercise and medications. Discussed med's effects and SE's. Screening labs and tests as requested with regular follow-up as recommended. Over 40 minutes of exam, counseling, chart review and high complex critical decision making was performed.

## 2017-11-19 LAB — HEPATIC FUNCTION PANEL
AG Ratio: 2 (calc) (ref 1.0–2.5)
ALT: 14 U/L (ref 6–29)
AST: 19 U/L (ref 10–35)
Albumin: 4.1 g/dL (ref 3.6–5.1)
Alkaline phosphatase (APISO): 90 U/L (ref 33–130)
Bilirubin, Direct: 0.1 mg/dL (ref 0.0–0.2)
Globulin: 2.1 g/dL (calc) (ref 1.9–3.7)
Indirect Bilirubin: 0.6 mg/dL (calc) (ref 0.2–1.2)
Total Bilirubin: 0.7 mg/dL (ref 0.2–1.2)
Total Protein: 6.2 g/dL (ref 6.1–8.1)

## 2017-11-19 LAB — BASIC METABOLIC PANEL WITH GFR
BUN/Creatinine Ratio: 17 (calc) (ref 6–22)
BUN: 17 mg/dL (ref 7–25)
CO2: 32 mmol/L (ref 20–32)
Calcium: 9.8 mg/dL (ref 8.6–10.4)
Chloride: 102 mmol/L (ref 98–110)
Creat: 0.99 mg/dL — ABNORMAL HIGH (ref 0.60–0.88)
GFR, Est African American: 62 mL/min/{1.73_m2} (ref >=60)
GFR, Est Non African American: 53 mL/min/{1.73_m2} — ABNORMAL LOW (ref >=60)
Glucose, Bld: 115 mg/dL — ABNORMAL HIGH (ref 65–99)
Potassium: 4.2 mmol/L (ref 3.5–5.3)
Sodium: 140 mmol/L (ref 135–146)

## 2017-11-19 LAB — CBC WITH DIFFERENTIAL/PLATELET
Basophils Absolute: 50 cells/uL (ref 0–200)
Basophils Relative: 0.9 %
Eosinophils Absolute: 179 cells/uL (ref 15–500)
Eosinophils Relative: 3.2 %
HCT: 40.8 % (ref 35.0–45.0)
Hemoglobin: 14 g/dL (ref 11.7–15.5)
Lymphs Abs: 974 cells/uL (ref 850–3900)
MCH: 30 pg (ref 27.0–33.0)
MCHC: 34.3 g/dL (ref 32.0–36.0)
MCV: 87.6 fL (ref 80.0–100.0)
MPV: 11.8 fL (ref 7.5–12.5)
Monocytes Relative: 10.4 %
Neutro Abs: 3814 cells/uL (ref 1500–7800)
Neutrophils Relative %: 68.1 %
Platelets: 175 10*3/uL (ref 140–400)
RBC: 4.66 10*6/uL (ref 3.80–5.10)
RDW: 13.3 % (ref 11.0–15.0)
Total Lymphocyte: 17.4 %
WBC mixed population: 582 cells/uL (ref 200–950)
WBC: 5.6 10*3/uL (ref 3.8–10.8)

## 2017-11-19 LAB — MICROALBUMIN / CREATININE URINE RATIO
Creatinine, Urine: 110 mg/dL (ref 20–275)
Microalb Creat Ratio: 21 mcg/mg creat (ref <30)
Microalb, Ur: 2.3 mg/dL

## 2017-11-19 LAB — URINALYSIS, ROUTINE W REFLEX MICROSCOPIC
Bilirubin Urine: NEGATIVE
Glucose, UA: NEGATIVE
Hgb urine dipstick: NEGATIVE
Ketones, ur: NEGATIVE
Leukocytes, UA: NEGATIVE
Nitrite: NEGATIVE
Protein, ur: NEGATIVE
Specific Gravity, Urine: 1.017 (ref 1.001–1.03)
pH: 7 (ref 5.0–8.0)

## 2017-11-19 LAB — MAGNESIUM: Magnesium: 2.2 mg/dL (ref 1.5–2.5)

## 2017-11-19 LAB — VITAMIN D 25 HYDROXY (VIT D DEFICIENCY, FRACTURES): Vit D, 25-Hydroxy: 81 ng/mL (ref 30–100)

## 2017-11-19 LAB — LIPID PANEL
Cholesterol: 195 mg/dL (ref <200)
HDL: 55 mg/dL (ref >50)
LDL Cholesterol (Calc): 118 mg/dL (calc) — ABNORMAL HIGH
Non-HDL Cholesterol (Calc): 140 mg/dL (calc) — ABNORMAL HIGH (ref <130)
Total CHOL/HDL Ratio: 3.5 (calc) (ref <5.0)
Triglycerides: 117 mg/dL (ref <150)

## 2017-11-19 LAB — TSH: TSH: 0.9 mIU/L (ref 0.40–4.50)

## 2017-11-19 LAB — HEMOGLOBIN A1C
Hgb A1c MFr Bld: 6.4 % of total Hgb — ABNORMAL HIGH (ref <5.7)
Mean Plasma Glucose: 137 (calc)
eAG (mmol/L): 7.6 (calc)

## 2017-11-19 LAB — INSULIN, RANDOM: Insulin: 21.2 u[IU]/mL — ABNORMAL HIGH (ref 2.0–19.6)

## 2017-12-01 ENCOUNTER — Encounter: Payer: Self-pay | Admitting: Internal Medicine

## 2017-12-23 ENCOUNTER — Other Ambulatory Visit: Payer: Self-pay | Admitting: Internal Medicine

## 2017-12-23 DIAGNOSIS — Z139 Encounter for screening, unspecified: Secondary | ICD-10-CM

## 2018-01-10 ENCOUNTER — Other Ambulatory Visit: Payer: Self-pay | Admitting: Internal Medicine

## 2018-01-11 ENCOUNTER — Ambulatory Visit
Admission: RE | Admit: 2018-01-11 | Discharge: 2018-01-11 | Disposition: A | Discharge status: Home / Self Care | Payer: Medicare Other | Source: Ambulatory Visit | Attending: Internal Medicine | Admitting: Internal Medicine

## 2018-01-11 DIAGNOSIS — Z139 Encounter for screening, unspecified: Secondary | ICD-10-CM

## 2018-01-11 DIAGNOSIS — Z1231 Encounter for screening mammogram for malignant neoplasm of breast: Secondary | ICD-10-CM | POA: Diagnosis not present

## 2018-02-23 DIAGNOSIS — E1122 Type 2 diabetes mellitus with diabetic chronic kidney disease: Secondary | ICD-10-CM | POA: Insufficient documentation

## 2018-02-23 DIAGNOSIS — N183 Chronic kidney disease, stage 3 unspecified: Secondary | ICD-10-CM | POA: Insufficient documentation

## 2018-02-23 NOTE — Progress Notes (Deleted)
MEDICARE ANNUAL WELLNESS VISIT AND FOLLOW UP  Assessment:   Diagnoses and all orders for this visit:  Encounter for Medicare annual wellness exam  Essential hypertension Continue medication: propanolol Monitor blood pressure at home; call if consistently over 130/80 Continue DASH diet.   Reminder to go to the ER if any CP, SOB, nausea, dizziness, severe HA, changes vision/speech, left arm numbness and tingling and jaw pain.  GERD Well managed on current medications Discussed diet, avoiding triggers and other lifestyle changes  Age-related osteoporosis without current pathological fracture ***  Vitamin D deficiency At goal at recent check; continue to recommend supplementation for goal of 70-100 Defer vitamin D level  Prediabetes Discussed disease and risks Discussed diet/exercise, weight management  A1C  Medication management CBC, BMP/GFR, LFTs  Spinal stenosis of lumbar region without neurogenic claudication Managed by ***  Hyperlipidemia Currently treated by lifestyle with borderline control Continue low cholesterol diet and exercise.  Check lipid panel.   Fibrocystic breast disease (FCBD), unspecified laterality Continue annual mammogram screening  CKD (chronic kidney disease) stage 3, GFR 30-59 ml/min (HCC) Increase fluids, avoid NSAIDS, monitor sugars, will monitor BMP/GFR    Over 40 minutes of exam, counseling, chart review and critical decision making was performed Future Appointments  Date Time Provider [place]  02/24/2018 10:30 AM [name], NP [facility] None  05/31/2018 10:30 AM Unk Pinto, MD [facility] None  12/14/2018 10:00 AM Unk Pinto, MD [facility] None     Plan:   During the course of the visit the patient was educated and counseled about appropriate screening and preventive services including:    Pneumococcal vaccine   Prevnar 13  Influenza vaccine  Td vaccine  Screening electrocardiogram  Bone  densitometry screening  Colorectal cancer screening  Diabetes screening  Glaucoma screening  Nutrition counseling   Advanced directives: requested   Subjective:  Briana [name] is a 84 y.o. female who presents for Medicare Annual Wellness Visit and 3 month follow up. Patient's GERD is controlled with her meds. Patient is also followed by Dr Nelva Bush with [facility] for Lumbar spinal stenosis and had L3/L5 SS decompression surg in July 2016 by Dr Apolonio Schneiders. She also has hereditary or essential tremors which have improved with Propanolol which she takes also for her HTN.   BMI is There is no height or weight on file to calculate BMI., she {HAS HAS QHU:76546} been working on diet and exercise. Wt Readings from Last 3 Encounters:  11/18/17 172 lb 3.2 oz (78.1 kg)  08/31/17 175 lb 6.4 oz (79.6 kg)  08/18/17 175 lb (79.4 kg)    Her blood pressure {HAS HAS NOT:18834} been controlled at home, today their BP is   She {DOES_DOES TKP:54656} workout. She denies chest pain, shortness of breath, dizziness.   She is not on cholesterol medication and denies myalgias. Her cholesterol is not at goal. The cholesterol last visit was:   Lab Results  Component Value Date   CHOL 195 11/18/2017   HDL 55 11/18/2017   LDLCALC 118 (H) 11/18/2017   TRIG 117 11/18/2017   CHOLHDL 3.5 11/18/2017    She {Has/has not:18111} been working on diet and exercise for prediabetes, and denies {Symptoms; diabetes w/o none:19199}. Last A1C in the office was:  Lab Results  Component Value Date   HGBA1C 6.4 (H) 11/18/2017   Last GFR: Lab Results  Component Value Date   GFRNONAA 53 (L) 11/18/2017   Patient is on Vitamin D supplement and at goal at recent check:  Lab Results  Component Value Date   VD25OH 81 11/18/2017      Medication Review: Current Outpatient Medications on File Prior to Visit  Medication Sig Dispense Refill  . albuterol (PROVENTIL HFA;VENTOLIN HFA) 108 (90 BASE) MCG/ACT inhaler Inhale 2 puffs into the  lungs every 6 (six) hours as needed for wheezing or shortness of breath. 1 Inhaler 2  . Ascorbic Acid (VITAMIN C) 1000 MG tablet Take 1,000 mg by mouth 2 (two) times daily.    Marland Kitchen aspirin 81 MG tablet Take 81 mg by mouth daily.    Marland Kitchen b complex vitamins tablet Take 1 tablet by mouth daily.    . bumetanide (BUMEX) 1 MG tablet TAKE ONE-HALF TO ONE TABLET BY MOUTH TWICE DAILY AS NEEDED FOR  SWELLING 90 tablet 1  . Calcium Carbonate (CALCIUM 600 PO) Take 600 mg by mouth every evening.    . Cholecalciferol (VITAMIN D3) 2000 UNITS TABS Take 2,000-4,000 Units by mouth 2 (two) times daily. Take 4000 units in the morning and 2000 units in the evening    . fluticasone (FLONASE) 50 MCG/ACT nasal spray Place 2 sprays into both nostrils daily. 16 g 0  . MELATONIN PO Take by mouth. Takes 1 at bedtime    . Misc Natural Products (OSTEO BI-FLEX JOINT SHIELD PO) Take 1 tablet by mouth 2 (two) times daily.     . propranolol ER (INDERAL LA) 80 MG 24 hr capsule TAKE 1 CAPSULE BY MOUTH ONCE DAILY FOR TREMORS 90 capsule 1  . ranitidine (ZANTAC) 300 MG tablet TAKE 1 TABLET BY MOUTH TWICE DAILY FOR ACID REFLUX 180 tablet 1   No current facility-administered medications on file prior to visit.     Allergies  Allergen Reactions  . Accupril [Quinapril Hcl] Cough  . Neosporin [Neomycin-Bacitracin Zn-Polymyx]   . Prednisone     Agitated and shaky  . Reglan [Metoclopramide] Other (See Comments)    tremors  . Tramadol Nausea And Vomiting  . Adhesive [Tape] Rash    Current Problems (verified) Patient Active Problem List   Diagnosis Date Noted  . Obesity (BMI 30.0-34.9) 09/05/2015  . Lumbar stenosis 06/05/2015  . Medication management 06/13/2014  . GERD 11/20/2013  . Osteoporosis 11/20/2013  . Essential hypertension 11/19/2013  . Hyperlipidemia 11/19/2013  . Prediabetes 11/19/2013  . Vitamin D deficiency 11/19/2013  . Diffuse cystic mastopathy 11/19/2013    Screening Tests Immunization History  Administered  Date(s) Administered  . DT 09/05/2015  . DTaP 11/16/2004  . Influenza Whole 08/30/2013  . Influenza, High Dose Seasonal PF 08/28/2015, 08/10/2016  . Pneumococcal Conjugate-13 10/19/2016  . Pneumococcal Polysaccharide-23 11/16/2001  . Zoster 06/01/2013   Preventative care: Last colonoscopy: 2011 Mammogram: 12/2017 DEXA: 2009 ***  Prior vaccinations: TD or Tdap: 2016  Influenza: 2017  Pneumococcal: 2003 Prevnar13: 2017 Shingles/Zostavax: 2014  Names of Other Physician/Practitioners you currently use: 1. [facility] here for primary care 2. Dr. Katy Fitch, eye doctor, last visit 2017 3. Dr. Eliezer Bottom , dentist, last visit 2017  Patient Care Team: Unk Pinto, MD as PCP - General (Internal Medicine) Iran Planas, MD as Consulting Physician (Orthopedic Surgery) Ladene Artist, MD as Consulting Physician (Gastroenterology) Suella Broad, MD as Consulting Physician (Physical Medicine and Rehabilitation) Rolm Bookbinder, MD as Consulting Physician (Dermatology)  SURGICAL HISTORY She  has a past surgical history that includes Thyroid surgery; varicose veins stripped; Tonsillectomy; Appendectomy; Tubal ligation; Open reduction internal fixation (orif) distal radial fracture (Right, 01/10/2014); Colonoscopy; Esophagogastroduodenoscopy; Breast surgery (Right);  Joint replacement (Left); Spinal cord decompression (06/05/2015); and Lumbar laminectomy/decompression microdiscectomy (N/A, 06/05/2015). FAMILY HISTORY Her family history includes Cancer in her brother; Heart disease in her sister; Hyperlipidemia in her mother; Hypertension in her mother. SOCIAL HISTORY She  reports that she has never smoked. She has never used smokeless tobacco. She reports that she does not drink alcohol or use drugs.   MEDICARE WELLNESS OBJECTIVES: Physical activity:   Cardiac risk factors:   Depression/mood screen:   Depression screen [facility] 2/9 11/18/2017  Decreased Interest  0  Down, Depressed, Hopeless 0  PHQ - 2 Score 0    ADLs:  In your present state of health, do you have any difficulty performing the following activities: 11/18/2017  Hearing? N  Vision? N  Difficulty concentrating or making decisions? N  Walking or climbing stairs? N  Dressing or bathing? N  Doing errands, shopping? N  Some recent data might be hidden     Cognitive Testing  Alert? Yes  Normal Appearance?Yes  Oriented to person? Yes  Place? Yes   Time? Yes  Recall of three objects?  Yes  Can perform simple calculations? Yes  Displays appropriate judgment?Yes  Can read the correct time from a watch face?Yes  EOL planning:    Review of Systems  Constitutional: Negative for malaise/fatigue and weight loss.  HENT: Negative for hearing loss and tinnitus.   Eyes: Negative for blurred vision and double vision.  Respiratory: Negative for cough, sputum production, shortness of breath and wheezing.   Cardiovascular: Negative for chest pain, palpitations, orthopnea, claudication, leg swelling and PND.  Gastrointestinal: Negative for abdominal pain, blood in stool, constipation, diarrhea, heartburn, melena, nausea and vomiting.  Genitourinary: Negative.   Musculoskeletal: Negative for falls, joint pain and myalgias.  Skin: Negative for rash.  Neurological: Negative for dizziness, tingling, sensory change, weakness and headaches.  Endo/Heme/Allergies: Negative for polydipsia.  Psychiatric/Behavioral: Negative.  Negative for depression, memory loss, substance abuse and suicidal ideas. The patient is not nervous/anxious and does not have insomnia.   All other systems reviewed and are negative.    Objective:     There were no vitals filed for this visit. There is no height or weight on file to calculate BMI.  General appearance: alert, no distress, WD/WN, female HEENT: normocephalic, sclerae anicteric, TMs pearly, nares patent, no discharge or erythema, pharynx normal Oral cavity:  MMM, no lesions Neck: supple, no lymphadenopathy, no thyromegaly, no masses Heart: RRR, normal S1, S2, no murmurs Lungs: CTA bilaterally, no wheezes, rhonchi, or rales Abdomen: +bs, soft, non tender, non distended, no masses, no hepatomegaly, no splenomegaly Musculoskeletal: nontender, no swelling, no obvious deformity Extremities: no edema, no cyanosis, no clubbing Pulses: 2+ symmetric, upper and lower extremities, normal cap refill Neurological: alert, oriented x 3, CN2-12 intact, strength normal upper extremities and lower extremities, sensation normal throughout, DTRs 2+ throughout, no cerebellar signs, gait normal Psychiatric: normal affect, behavior normal, pleasant   Medicare Attestation I have personally reviewed: The patient's medical and social history Their use of alcohol, tobacco or illicit drugs Their current medications and supplements The patient's functional ability including ADLs,fall risks, home safety risks, cognitive, and hearing and visual impairment Diet and physical activities Evidence for depression or mood disorders  The patient's weight, height, BMI, and visual acuity have been recorded in the chart.  I have made referrals, counseling, and provided education to the patient based on review of the above and I have provided the patient with a written personalized care plan for preventive  services.     Izora Ribas, NP   02/23/2018

## 2018-02-24 ENCOUNTER — Ambulatory Visit: Payer: Self-pay | Admitting: Adult Health

## 2018-02-27 NOTE — Progress Notes (Signed)
MEDICARE ANNUAL WELLNESS VISIT AND FOLLOW UP  Assessment:   Diagnoses and all orders for this visit:  Encounter for Medicare annual wellness exam  Essential hypertension Continue medication: propanolol Monitor blood pressure at home; call if consistently over 130/80 Continue DASH diet.   Reminder to go to the ER if any CP, SOB, nausea, dizziness, severe HA, changes vision/speech, left arm numbness and tingling and jaw pain.  GERD Well managed on current medications Discussed diet, avoiding triggers and other lifestyle changes  Age-related osteoporosis without current pathological fracture Will refer back for DEXA today Continue exercises, calcium and vitamin D   Vitamin D deficiency At goal at recent check; continue to recommend supplementation for goal of 70-100 Defer vitamin D level  Prediabetes Discussed disease and risks Discussed diet/exercise, weight management  A1C  Medication management CBC, BMP/GFR, LFTs  Spinal stenosis of lumbar region without neurogenic claudication Denies any back pain or concerning symptoms  Hyperlipidemia Currently treated by lifestyle with borderline control Continue low cholesterol diet and exercise.  Check lipid panel.   Fibrocystic breast disease (FCBD), unspecified laterality Continue annual mammogram screening  CKD (chronic kidney disease) stage 3, GFR 30-59 ml/min (HCC) Increase fluids, avoid NSAIDS, monitor sugars, will monitor BMP/GFR   Over 40 minutes of exam, counseling, chart review and critical decision making was performed Future Appointments  Date Time Provider [place]  05/31/2018 10:30 AM Unk Pinto, MD [facility] None  12/14/2018 10:00 AM Unk Pinto, MD [facility] None     Plan:   During the course of the visit the patient was educated and counseled about appropriate screening and preventive services including:    Pneumococcal vaccine   Prevnar 13  Influenza vaccine  Td  vaccine  Screening electrocardiogram  Bone densitometry screening  Colorectal cancer screening  Diabetes screening  Glaucoma screening  Nutrition counseling   Advanced directives: requested   Subjective:  Briana [name] is a 84 y.o. female who presents for Medicare Annual Wellness Visit and 3 month follow up. Patient's GERD is controlled with her meds. Patient is also followed by Dr Nelva Bush with [facility] for Lumbar spinal stenosis and had L3/L5 SS decompression surg in July 2016 by Dr Apolonio Schneiders. She also has hereditary or essential tremors which have improved with Propanolol which she takes also for her HTN.   BMI is Body mass index is 31.79 kg/m., she has been working on diet (avoiding fried foods) and exercise. Wt Readings from Last 3 Encounters:  02/28/18 171 lb (77.6 kg)  11/18/17 172 lb 3.2 oz (78.1 kg)  08/31/17 175 lb 6.4 oz (79.6 kg)    Her blood pressure has been controlled at home, today their BP is BP: 138/68 She does workout. She denies chest pain, shortness of breath, dizziness.   She is not on cholesterol medication and denies myalgias. Her cholesterol is not at goal. The cholesterol last visit was:   Lab Results  Component Value Date   CHOL 195 11/18/2017   HDL 55 11/18/2017   LDLCALC 118 (H) 11/18/2017   TRIG 117 11/18/2017   CHOLHDL 3.5 11/18/2017    She has been working on diet and exercise for prediabetes, and denies foot ulcerations, increased appetite, nausea, paresthesia of the feet, polydipsia, polyuria, visual disturbances, vomiting and weight loss. Last A1C in the office was:  Lab Results  Component Value Date   HGBA1C 6.4 (H) 11/18/2017   Last GFR: Lab Results  Component Value Date   GFRNONAA 53 (L) 11/18/2017   Patient is on  Vitamin D supplement and at goal at recent check:    Lab Results  Component Value Date   VD25OH 81 11/18/2017      Medication Review: Current Outpatient Medications on File Prior to Visit  Medication Sig Dispense Refill   . albuterol (PROVENTIL HFA;VENTOLIN HFA) 108 (90 BASE) MCG/ACT inhaler Inhale 2 puffs into the lungs every 6 (six) hours as needed for wheezing or shortness of breath. 1 Inhaler 2  . Ascorbic Acid (VITAMIN C) 1000 MG tablet Take 1,000 mg by mouth 2 (two) times daily.    Marland Kitchen aspirin 81 MG tablet Take 81 mg by mouth daily.    Marland Kitchen b complex vitamins tablet Take 1 tablet by mouth daily.    . bumetanide (BUMEX) 1 MG tablet TAKE ONE-HALF TO ONE TABLET BY MOUTH TWICE DAILY AS NEEDED FOR  SWELLING 90 tablet 1  . Calcium Carbonate (CALCIUM 600 PO) Take 600 mg by mouth every evening.    . Cholecalciferol (VITAMIN D3) 2000 UNITS TABS Take 2,000-4,000 Units by mouth 2 (two) times daily. Take 4000 units in the morning and 2000 units in the evening    . MELATONIN PO Take by mouth. Takes 1 at bedtime    . Misc Natural Products (OSTEO BI-FLEX JOINT SHIELD PO) Take 1 tablet by mouth 2 (two) times daily.     . propranolol ER (INDERAL LA) 80 MG 24 hr capsule TAKE 1 CAPSULE BY MOUTH ONCE DAILY FOR TREMORS 90 capsule 1  . ranitidine (ZANTAC) 300 MG tablet TAKE 1 TABLET BY MOUTH TWICE DAILY FOR ACID REFLUX 180 tablet 1   No current facility-administered medications on file prior to visit.     Allergies  Allergen Reactions  . Accupril [Quinapril Hcl] Cough  . Neosporin [Neomycin-Bacitracin Zn-Polymyx]   . Prednisone     Agitated and shaky  . Reglan [Metoclopramide] Other (See Comments)    tremors  . Tramadol Nausea And Vomiting  . Adhesive [Tape] Rash    Current Problems (verified) Patient Active Problem List   Diagnosis Date Noted  . CKD (chronic kidney disease) stage 3, GFR 30-59 ml/min (HCC) 02/23/2018  . Obesity (BMI 30.0-34.9) 09/05/2015  . Lumbar stenosis 06/05/2015  . Medication management 06/13/2014  . GERD 11/20/2013  . Osteoporosis 11/20/2013  . Essential hypertension 11/19/2013  . Hyperlipidemia 11/19/2013  . Prediabetes 11/19/2013  . Vitamin D deficiency 11/19/2013  . Diffuse cystic  mastopathy 11/19/2013    Screening Tests Immunization History  Administered Date(s) Administered  . DT 09/05/2015  . DTaP 11/16/2004  . Influenza Whole 08/30/2013  . Influenza, High Dose Seasonal PF 08/28/2015, 08/10/2016  . Pneumococcal Conjugate-13 10/19/2016  . Pneumococcal Polysaccharide-23 11/16/2001  . Zoster 06/01/2013   Preventative care: Last colonoscopy: 2011 Mammogram: 12/2017 DEXA: 2009 will get today  Prior vaccinations: TD or Tdap: 2016  Influenza: 2017, declines  Pneumococcal: 2003 Prevnar13: 2017 Shingles/Zostavax: 2014  Names of Other Physician/Practitioners you currently use: 1. [facility] here for primary care 2. Dr. Katy Fitch, eye doctor, last visit 2018 3. Dr. Eliezer Bottom , dentist, last visit 2018  Patient Care Team: Unk Pinto, MD as PCP - General (Internal Medicine) Iran Planas, MD as Consulting Physician (Orthopedic Surgery) Ladene Artist, MD as Consulting Physician (Gastroenterology) Suella Broad, MD as Consulting Physician (Physical Medicine and Rehabilitation) Rolm Bookbinder, MD as Consulting Physician (Dermatology)  SURGICAL HISTORY She  has a past surgical history that includes Thyroid surgery; varicose veins stripped; Tonsillectomy; Appendectomy; Tubal ligation; Open reduction internal fixation (orif)  distal radial fracture (Right, 01/10/2014); Colonoscopy; Esophagogastroduodenoscopy; Breast surgery (Right); Joint replacement (Left); Spinal cord decompression (06/05/2015); and Lumbar laminectomy/decompression microdiscectomy (N/A, 06/05/2015). FAMILY HISTORY Her family history includes Cancer in her brother; Heart disease in her sister; Hyperlipidemia in her mother; Hypertension in her mother. SOCIAL HISTORY She  reports that she has never smoked. She has never used smokeless tobacco. She reports that she does not drink alcohol or use drugs.   MEDICARE WELLNESS OBJECTIVES: Physical activity: Current  Exercise Habits: Home exercise routine, Type of exercise: walking, Time (Minutes): 60, Frequency (Times/Week): 3, Weekly Exercise (Minutes/Week): 180, Intensity: Mild Cardiac risk factors: Cardiac Risk Factors include: advanced age (>58men, >18 women);dyslipidemia;hypertension;obesity (BMI >30kg/m2) Depression/mood screen:   Depression screen [facility] 2/9 02/28/2018  Decreased Interest 0  Down, Depressed, Hopeless 0  PHQ - 2 Score 0    ADLs:  In your present state of health, do you have any difficulty performing the following activities: 02/28/2018 11/18/2017  Hearing? N N  Comment Has hearing aids -  Vision? N N  Difficulty concentrating or making decisions? N N  Walking or climbing stairs? N N  Dressing or bathing? N N  Doing errands, shopping? N N  Some recent data might be hidden     Cognitive Testing  Alert? Yes  Normal Appearance?Yes  Oriented to person? Yes  Place? Yes   Time? Yes  Recall of three objects?  Yes  Can perform simple calculations? Yes  Displays appropriate judgment?Yes  Can read the correct time from a watch face?Yes  EOL planning: Does Patient Have a Medical Advance Directive?: No Would patient like information on creating a medical advance directive?: No - Patient declined  Review of Systems  Constitutional: Negative for malaise/fatigue and weight loss.  HENT: Negative for hearing loss and tinnitus.   Eyes: Negative for blurred vision and double vision.  Respiratory: Negative for cough, sputum production, shortness of breath and wheezing.   Cardiovascular: Negative for chest pain, palpitations, orthopnea, claudication, leg swelling and PND.  Gastrointestinal: Negative for abdominal pain, blood in stool, constipation, diarrhea, heartburn, melena, nausea and vomiting.  Genitourinary: Negative.   Musculoskeletal: Negative for falls, joint pain and myalgias.  Skin: Negative for rash.  Neurological: Negative for dizziness, tingling, sensory change, weakness and  headaches.  Endo/Heme/Allergies: Negative for polydipsia.  Psychiatric/Behavioral: Negative.  Negative for depression, memory loss, substance abuse and suicidal ideas. The patient is not nervous/anxious and does not have insomnia.   All other systems reviewed and are negative.    Objective:     Today's Vitals   02/28/18 1024  BP: 138/68  Pulse: 61  Temp: (!) 97.3 F (36.3 C)  SpO2: 93%  Weight: 171 lb (77.6 kg)  Height: 5' 1.5" (1.562 m)   Body mass index is 31.79 kg/m.  General appearance: alert, no distress, WD/WN, female HEENT: normocephalic, sclerae anicteric, TMs pearly, nares patent, no discharge or erythema, pharynx normal Oral cavity: MMM, no lesions Neck: supple, no lymphadenopathy, no thyromegaly, no masses Heart: RRR, normal S1, S2, no murmurs Lungs: CTA bilaterally, no wheezes, rhonchi, or rales Abdomen: +bs, soft, non tender, non distended, no masses, no hepatomegaly, no splenomegaly Musculoskeletal: nontender, no swelling, no obvious deformity Extremities: no edema, no cyanosis, no clubbing Pulses: 2+ symmetric, upper and lower extremities, normal cap refill Neurological: alert, oriented x 3, CN2-12 intact, strength normal upper extremities and lower extremities, sensation normal throughout, DTRs 2+ throughout, no cerebellar signs, gait normal Psychiatric: normal affect, behavior normal, pleasant   Medicare Attestation I  have personally reviewed: The patient's medical and social history Their use of alcohol, tobacco or illicit drugs Their current medications and supplements The patient's functional ability including ADLs,fall risks, home safety risks, cognitive, and hearing and visual impairment Diet and physical activities Evidence for depression or mood disorders  The patient's weight, height, BMI, and visual acuity have been recorded in the chart.  I have made referrals, counseling, and provided education to the patient based on review of the above and I  have provided the patient with a written personalized care plan for preventive services.     Izora Ribas, NP   02/28/2018

## 2018-02-28 ENCOUNTER — Ambulatory Visit: Payer: Medicare Other | Admitting: Adult Health

## 2018-02-28 ENCOUNTER — Encounter: Payer: Self-pay | Admitting: Adult Health

## 2018-02-28 VITALS — BP 138/68 | HR 61 | Temp 97.3°F | Ht 61.5 in | Wt 171.0 lb

## 2018-02-28 DIAGNOSIS — I10 Essential (primary) hypertension: Secondary | ICD-10-CM

## 2018-02-28 DIAGNOSIS — E782 Mixed hyperlipidemia: Secondary | ICD-10-CM

## 2018-02-28 DIAGNOSIS — R7303 Prediabetes: Secondary | ICD-10-CM | POA: Diagnosis not present

## 2018-02-28 DIAGNOSIS — N6019 Diffuse cystic mastopathy of unspecified breast: Secondary | ICD-10-CM

## 2018-02-28 DIAGNOSIS — K2100 Gastro-esophageal reflux disease with esophagitis, without bleeding: Secondary | ICD-10-CM

## 2018-02-28 DIAGNOSIS — R6889 Other general symptoms and signs: Secondary | ICD-10-CM | POA: Diagnosis not present

## 2018-02-28 DIAGNOSIS — Z79899 Other long term (current) drug therapy: Secondary | ICD-10-CM | POA: Diagnosis not present

## 2018-02-28 DIAGNOSIS — Z0000 Encounter for general adult medical examination without abnormal findings: Secondary | ICD-10-CM

## 2018-02-28 DIAGNOSIS — N183 Chronic kidney disease, stage 3 unspecified: Secondary | ICD-10-CM

## 2018-02-28 DIAGNOSIS — E2839 Other primary ovarian failure: Secondary | ICD-10-CM

## 2018-02-28 DIAGNOSIS — M48061 Spinal stenosis, lumbar region without neurogenic claudication: Secondary | ICD-10-CM

## 2018-02-28 DIAGNOSIS — E559 Vitamin D deficiency, unspecified: Secondary | ICD-10-CM

## 2018-02-28 DIAGNOSIS — E669 Obesity, unspecified: Secondary | ICD-10-CM | POA: Diagnosis not present

## 2018-02-28 DIAGNOSIS — Z0001 Encounter for general adult medical examination with abnormal findings: Secondary | ICD-10-CM | POA: Diagnosis not present

## 2018-02-28 DIAGNOSIS — M810 Age-related osteoporosis without current pathological fracture: Secondary | ICD-10-CM | POA: Diagnosis not present

## 2018-02-28 MED ORDER — FLUTICASONE PROPIONATE 50 MCG/ACT NA SUSP: 2.0000 | Freq: Every day | NASAL | 0 refills | Status: DC

## 2018-02-28 MED ORDER — FLUTICASONE PROPIONATE 50 MCG/ACT NA SUSP: 2.0000 | Freq: Every day | NASAL | 1 refills | Status: DC

## 2018-02-28 NOTE — Patient Instructions (Signed)
Can stop calcium supplement as long as eating plenty of calcium rich foods (broccoli, etc)    Aim for 7+ servings of fruits and vegetables daily  80+ fluid ounces of water or unsweet tea for healthy kidneys  Limit alcohol intake  Limit animal fats in diet for cholesterol and heart health - choose grass fed whenever available  Aim for low stress - take time to unwind and care for your mental health  Aim for 150 min of moderate intensity exercise weekly for heart health, and weights twice weekly for bone health  Aim for 7-9 hours of sleep daily    Preventing Osteoporosis, Adult Osteoporosis is a condition that causes the bones to get weaker. With osteoporosis, the bones become thinner, and the normal spaces in bone tissue become larger. This can make the bones weak and cause them to break more easily. People who have osteoporosis are more likely to break their wrist, spine, or hip. Even a minor accident or injury can be enough to break weak bones. Osteoporosis can occur with aging. Your body constantly replaces old bone tissue with new tissue. As you get older, you may lose bone tissue more quickly, or it may be replaced more slowly. Osteoporosis is more likely to develop if you have poor nutrition or do not get enough calcium or vitamin D. Other lifestyle factors can also play a role. By making some diet and lifestyle changes, you can help to keep your bones healthy and help to prevent osteoporosis. What nutrition changes can be made? Nutrition plays an important role in maintaining healthy, strong bones.  Make sure you get enough calcium every day from food or from calcium supplements. ? If you are age 42 or younger, aim to get 1,000 mg of calcium every day. ? If you are older than age 29, aim to get 1,200 mg of calcium every day.  Try to get enough vitamin D every day. ? If you are age 71 or younger, aim to get 600 international units (IU) every day. ? If you are older than age 79,  aim to get 800 international units every day.  Follow a healthy diet. Eat plenty of foods that contain calcium and vitamin D. ? Calcium is in milk, cheese, yogurt, and other dairy products. Some fish and vegetables are also good sources of calcium. Many foods such as cereals and breads have had calcium added to them (are fortified). Check nutrition labels to see how much calcium is in a food or drink. ? Foods that contain vitamin D include milk, cereals, salmon, and tuna. Your body also makes vitamin D when you are out in the sun. Bare skin exposure to the sun on your face, arms, legs, or back for no more than 30 minutes a day, 2 times per week is more than enough. Beyond that, it is important to use sunblock to protect your skin from sunburn, which increases your risk for skin cancer.  What lifestyle changes can be made? Making changes in your everyday life can also play an important role in preventing osteoporosis.  Stay active and get exercise every day. Ask your health care provider what types of exercise are best for you.  Do not use any products that contain nicotine or tobacco, such as cigarettes and e-cigarettes. If you need help quitting, ask your health care provider.  Limit alcohol intake to no more than 1 drink a day for nonpregnant women and 2 drinks a day for men. One drink equals 12  oz of beer, 5 oz of wine, or 1 oz of hard liquor.  Why are these changes important? Making these nutrition and lifestyle changes can:  Help you develop and maintain healthy, strong bones.  Prevent loss of bone mass and the problems that are caused by that loss, such as broken bones and delayed healing.  Make you feel better mentally and physically.  What can happen if changes are not made? Problems that can result from osteoporosis can be very serious. These may include:  A higher risk of broken bones that are painful and do not heal well.  Physical malformations, such as a collapsed spine or  a hunched back.  Problems with movement.  Where to find support: If you need help making changes to prevent osteoporosis, talk with your health care provider. You can ask for a referral to a diet and nutrition specialist (dietitian) and a physical therapist. Where to find more information: Learn more about osteoporosis from:  NIH Osteoporosis and Related [place]: www.niams.GolfingGoddess.com.br  U[place]S. Office on Women's Health: SouvenirBaseball.es.html  National Osteoporosis Foundation: ProfilePeek.ch  Summary  Osteoporosis is a condition that causes weak bones that are more likely to break.  Eating a healthy diet and making sure you get enough calcium and vitamin D can help prevent osteoporosis.  Other ways to reduce your risk of osteoporosis include getting regular exercise and avoiding alcohol and products that contain nicotine or tobacco. This information is not intended to replace advice given to you by your health care provider. Make sure you discuss any questions you have with your health care provider. Document Released: 11/17/2015 Document Revised: 07/13/2016 Document Reviewed: 07/13/2016 Elsevier Interactive Patient Education  [employer].

## 2018-03-01 LAB — HEPATIC FUNCTION PANEL
AG Ratio: 1.9 (calc) (ref 1.0–2.5)
ALT: 15 U/L (ref 6–29)
AST: 22 U/L (ref 10–35)
Albumin: 4.5 g/dL (ref 3.6–5.1)
Alkaline phosphatase (APISO): 107 U/L (ref 33–130)
Bilirubin, Direct: 0.1 mg/dL (ref 0.0–0.2)
Globulin: 2.4 g/dL (calc) (ref 1.9–3.7)
Indirect Bilirubin: 0.5 mg/dL (calc) (ref 0.2–1.2)
Total Bilirubin: 0.6 mg/dL (ref 0.2–1.2)
Total Protein: 6.9 g/dL (ref 6.1–8.1)

## 2018-03-01 LAB — CBC WITH DIFFERENTIAL/PLATELET
Basophils Absolute: 41 cells/uL (ref 0–200)
Basophils Relative: 0.6 %
Eosinophils Absolute: 193 cells/uL (ref 15–500)
Eosinophils Relative: 2.8 %
HCT: 42.1 % (ref 35.0–45.0)
Hemoglobin: 14.2 g/dL (ref 11.7–15.5)
Lymphs Abs: 1573 cells/uL (ref 850–3900)
MCH: 29.4 pg (ref 27.0–33.0)
MCHC: 33.7 g/dL (ref 32.0–36.0)
MCV: 87.2 fL (ref 80.0–100.0)
MPV: 11.4 fL (ref 7.5–12.5)
Monocytes Relative: 10.9 %
Neutro Abs: 4340 cells/uL (ref 1500–7800)
Neutrophils Relative %: 62.9 %
Platelets: 204 10*3/uL (ref 140–400)
RBC: 4.83 10*6/uL (ref 3.80–5.10)
RDW: 13.6 % (ref 11.0–15.0)
Total Lymphocyte: 22.8 %
WBC mixed population: 752 cells/uL (ref 200–950)
WBC: 6.9 10*3/uL (ref 3.8–10.8)

## 2018-03-01 LAB — BASIC METABOLIC PANEL WITH GFR
BUN/Creatinine Ratio: 24 (calc) — ABNORMAL HIGH (ref 6–22)
BUN: 22 mg/dL (ref 7–25)
CO2: 32 mmol/L (ref 20–32)
Calcium: 10.1 mg/dL (ref 8.6–10.4)
Chloride: 102 mmol/L (ref 98–110)
Creat: 0.93 mg/dL — ABNORMAL HIGH (ref 0.60–0.88)
GFR, Est African American: 66 mL/min/{1.73_m2} (ref >=60)
GFR, Est Non African American: 57 mL/min/{1.73_m2} — ABNORMAL LOW (ref >=60)
Glucose, Bld: 110 mg/dL — ABNORMAL HIGH (ref 65–99)
Potassium: 4.3 mmol/L (ref 3.5–5.3)
Sodium: 141 mmol/L (ref 135–146)

## 2018-03-01 LAB — HEMOGLOBIN A1C
Hgb A1c MFr Bld: 6.4 % of total Hgb — ABNORMAL HIGH (ref <5.7)
Mean Plasma Glucose: 137 (calc)
eAG (mmol/L): 7.6 (calc)

## 2018-03-01 LAB — LIPID PANEL
Cholesterol: 196 mg/dL (ref <200)
HDL: 59 mg/dL (ref >50)
LDL Cholesterol (Calc): 118 mg/dL (calc) — ABNORMAL HIGH
Non-HDL Cholesterol (Calc): 137 mg/dL (calc) — ABNORMAL HIGH (ref <130)
Total CHOL/HDL Ratio: 3.3 (calc) (ref <5.0)
Triglycerides: 89 mg/dL (ref <150)

## 2018-03-01 LAB — TSH: TSH: 0.76 mIU/L (ref 0.40–4.50)

## 2018-03-10 ENCOUNTER — Ambulatory Visit
Admission: RE | Admit: 2018-03-10 | Discharge: 2018-03-10 | Disposition: A | Discharge status: Home / Self Care | Payer: Medicare Other | Source: Ambulatory Visit | Attending: Adult Health | Admitting: Adult Health

## 2018-03-10 DIAGNOSIS — E2839 Other primary ovarian failure: Secondary | ICD-10-CM

## 2018-03-10 DIAGNOSIS — M8589 Other specified disorders of bone density and structure, multiple sites: Secondary | ICD-10-CM | POA: Diagnosis not present

## 2018-03-10 DIAGNOSIS — Z780 Asymptomatic menopausal state: Secondary | ICD-10-CM | POA: Diagnosis not present

## 2018-04-12 ENCOUNTER — Other Ambulatory Visit: Payer: Self-pay | Admitting: Adult Health

## 2018-04-28 DIAGNOSIS — D0461 Carcinoma in situ of skin of right upper limb, including shoulder: Secondary | ICD-10-CM | POA: Diagnosis not present

## 2018-04-28 DIAGNOSIS — L570 Actinic keratosis: Secondary | ICD-10-CM | POA: Diagnosis not present

## 2018-04-28 DIAGNOSIS — L820 Inflamed seborrheic keratosis: Secondary | ICD-10-CM | POA: Diagnosis not present

## 2018-04-28 DIAGNOSIS — D485 Neoplasm of uncertain behavior of skin: Secondary | ICD-10-CM | POA: Diagnosis not present

## 2018-05-30 NOTE — Progress Notes (Signed)
FOLLOW UP  Assessment and Plan:   Hypertension Well controlled with current medications  Monitor blood pressure at home; patient to call if consistently greater than 130/80 Continue DASH diet.   Reminder to go to the ER if any CP, SOB, nausea, dizziness, severe HA, changes vision/speech, left arm numbness and tingling and jaw pain.  Cholesterol Currently mildly elevated, not aggressively pursued secondary to age Continue low cholesterol diet and exercise.  Check lipid panel.   Prediabetes Continue diet and exercise; discussed carbs at length Perform daily foot/skin check, notify office of any concerning changes.  Check A1C  Obesity with co morbidities Long discussion about weight loss, diet, and exercise Recommended diet heavy in fruits and veggies and low in animal meats, cheeses, and dairy products, appropriate calorie intake Discussed ideal weight for height  Patient will work on continuing with portion control, avoiding sweets, carbs, she will go look for low GI recipe book Will follow up in 3 months  Vitamin D Def At goal at last check; continue supplementation to maintain goal of 70-100 Defer Vit D level  Continue diet and meds as discussed. Further disposition pending results of labs. Discussed med's effects and SE's.   Over 30 minutes of exam, counseling, chart review, and critical decision making was performed.   Future Appointments  Date Time Provider [place]  12/14/2018 10:00 AM Unk Pinto, MD [facility] None  03/10/2019 10:15 AM [name], NP [facility] None    ----------------------------------------------------------------------------------------------------------------------  HPI 84 y.o. female  presents for 3 month follow up on hypertension, cholesterol, prediabetes, obesity and vitamin D deficiency.   BMI is Body mass index is 31.6 kg/m., she has been working on diet and exercise, has been walking 3 days a week.  Wt Readings from  Last 3 Encounters:  05/31/18 170 lb (77.1 kg)  02/28/18 171 lb (77.6 kg)  11/18/17 172 lb 3.2 oz (78.1 kg)   Her blood pressure has been controlled at home, today their BP is BP: 140/80  She does workout. She denies chest pain, shortness of breath, dizziness.   She is not on cholesterol medication. Her cholesterol is not at goal. The cholesterol last visit was:   Lab Results  Component Value Date   CHOL 196 02/28/2018   HDL 59 02/28/2018   LDLCALC 118 (H) 02/28/2018   TRIG 89 02/28/2018   CHOLHDL 3.3 02/28/2018    She has been working on diet and exercise for prediabetes, and denies foot ulcerations, increased appetite, nausea, paresthesia of the feet, polydipsia, polyuria, visual disturbances, vomiting and weight loss. Last A1C in the office was:  Lab Results  Component Value Date   HGBA1C 6.4 (H) 02/28/2018   Patient is on Vitamin D supplement.   Lab Results  Component Value Date   VD25OH 61 11/18/2017        Current Medications:  Current Outpatient Medications on File Prior to Visit  Medication Sig  . albuterol (PROVENTIL HFA;VENTOLIN HFA) 108 (90 BASE) MCG/ACT inhaler Inhale 2 puffs into the lungs every 6 (six) hours as needed for wheezing or shortness of breath.  . Ascorbic Acid (VITAMIN C) 1000 MG tablet Take 1,000 mg by mouth daily.   Marland Kitchen aspirin 81 MG tablet Take 81 mg by mouth daily.  Marland Kitchen b complex vitamins tablet Take 1 tablet by mouth daily.  . bumetanide (BUMEX) 1 MG tablet TAKE ONE-HALF TO ONE TABLET BY MOUTH TWICE DAILY AS NEEDED FOR  SWELLING  . Calcium Carbonate (CALCIUM 600 PO) Take 600 mg  by mouth every evening.  . Cholecalciferol (VITAMIN D3) 2000 UNITS TABS Take 2,000-4,000 Units by mouth 2 (two) times daily. Take 4000 units in the morning and 2000 units in the evening  . fluticasone (FLONASE) 50 MCG/ACT nasal spray Place 2 sprays into both nostrils daily. (Patient taking differently: Place 2 sprays into both nostrils as needed. )  . MELATONIN PO Take by  mouth. Takes 1 at bedtime  . Misc Natural Products (OSTEO BI-FLEX JOINT SHIELD PO) Take 1 tablet by mouth 2 (two) times daily.   . propranolol ER (INDERAL LA) 80 MG 24 hr capsule TAKE 1 CAPSULE BY MOUTH ONCE DAILY FOR  TREMORS  . ranitidine (ZANTAC) 300 MG tablet TAKE 1 TABLET BY MOUTH TWICE DAILY FOR ACID REFLUX   No current facility-administered medications on file prior to visit.      Allergies:  Allergies  Allergen Reactions  . Accupril [Quinapril Hcl] Cough  . Neosporin [Neomycin-Bacitracin Zn-Polymyx]   . Prednisone     Agitated and shaky  . Reglan [Metoclopramide] Other (See Comments)    tremors  . Tramadol Nausea And Vomiting  . Adhesive [Tape] Rash     Medical History:  Past Medical History:  Diagnosis Date  . Cancer ([place])    skin cancer  . DDD (degenerative disc disease)   . Diabetes mellitus without complication ([place])    "pre-diabetes"  . Diverticulitis   . DJD (degenerative joint disease)   . GERD (gastroesophageal reflux disease)    takes ranitidine  . Heart murmur   . History of hiatal hernia   . History of pneumonia   . Hx of irritable bowel syndrome   . Hyperlipidemia   . Occasional tremors   . Osteoporosis   . Pre-diabetes   . Varicose veins   . Vitamin D deficiency    Family history- Reviewed and unchanged Social history- Reviewed and unchanged   Review of Systems:  Review of Systems  Constitutional: Negative for malaise/fatigue and weight loss.  HENT: Negative for hearing loss and tinnitus.   Eyes: Negative for blurred vision and double vision.  Respiratory: Negative for cough, shortness of breath and wheezing.   Cardiovascular: Negative for chest pain, palpitations, orthopnea, claudication and leg swelling.  Gastrointestinal: Negative for abdominal pain, blood in stool, constipation, diarrhea, heartburn, melena, nausea and vomiting.  Genitourinary: Negative.   Musculoskeletal: Negative for joint pain and myalgias.  Skin: Negative for  rash.  Neurological: Negative for dizziness, tingling, sensory change, weakness and headaches.  Endo/Heme/Allergies: Negative for polydipsia.  Psychiatric/Behavioral: Negative.   All other systems reviewed and are negative.   Physical Exam: BP 140/80   Pulse 64   Temp 97.7 F (36.5 C)   Ht 5' 1.5" (1.562 m)   Wt 170 lb (77.1 kg)   SpO2 94%   BMI 31.60 kg/m  Wt Readings from Last 3 Encounters:  05/31/18 170 lb (77.1 kg)  02/28/18 171 lb (77.6 kg)  11/18/17 172 lb 3.2 oz (78.1 kg)   General Appearance: Well nourished, in no apparent distress. Eyes: PERRLA, EOMs, conjunctiva no swelling or erythema Sinuses: No Frontal/maxillary tenderness ENT/Mouth: Ext aud canals clear, TMs without erythema, bulging. No erythema, swelling, or exudate on post pharynx.  Tonsils not swollen or erythematous. Hearing normal.  Neck: Supple, thyroid normal.  Respiratory: Respiratory effort normal, BS equal bilaterally without rales, rhonchi, wheezing or stridor.  Cardio: RRR with no MRGs. Brisk peripheral pulses without edema. Extensive superficial varicosities through bilateral ankles and lower legs.  Abdomen: Soft, +  BS.  Non tender, no guarding, rebound, hernias, masses. Lymphatics: Non tender without lymphadenopathy.  Musculoskeletal: Full ROM, 5/5 strength, Normal gait Skin: Warm, dry without rashes, lesions, ecchymosis.  Neuro: Cranial nerves intact. No cerebellar symptoms.  Psych: Awake and oriented X 3, normal affect, Insight and Judgment appropriate.    Izora Ribas, NP 10:47 AM [facility]

## 2018-05-31 ENCOUNTER — Ambulatory Visit: Payer: Medicare Other | Admitting: Adult Health

## 2018-05-31 ENCOUNTER — Encounter: Payer: Self-pay | Admitting: Adult Health

## 2018-05-31 ENCOUNTER — Ambulatory Visit: Payer: Self-pay | Admitting: Internal Medicine

## 2018-05-31 VITALS — BP 140/80 | HR 64 | Temp 97.7°F | Ht 61.5 in | Wt 170.0 lb

## 2018-05-31 DIAGNOSIS — R7303 Prediabetes: Secondary | ICD-10-CM | POA: Diagnosis not present

## 2018-05-31 DIAGNOSIS — I10 Essential (primary) hypertension: Secondary | ICD-10-CM | POA: Diagnosis not present

## 2018-05-31 DIAGNOSIS — N183 Chronic kidney disease, stage 3 unspecified: Secondary | ICD-10-CM

## 2018-05-31 DIAGNOSIS — E66811 Obesity, class 1: Secondary | ICD-10-CM

## 2018-05-31 DIAGNOSIS — E669 Obesity, unspecified: Secondary | ICD-10-CM | POA: Diagnosis not present

## 2018-05-31 DIAGNOSIS — E782 Mixed hyperlipidemia: Secondary | ICD-10-CM

## 2018-05-31 DIAGNOSIS — E559 Vitamin D deficiency, unspecified: Secondary | ICD-10-CM | POA: Diagnosis not present

## 2018-05-31 DIAGNOSIS — Z79899 Other long term (current) drug therapy: Secondary | ICD-10-CM

## 2018-05-31 NOTE — Patient Instructions (Addendum)
Aim for 7+ servings of fruits and vegetables daily  80+ fluid ounces of water or unsweet tea for healthy kidneys  Limit animal fats in diet for cholesterol and heart health - choose grass fed whenever available  Aim for low stress - take time to unwind and care for your mental health  Aim for 150 min of moderate intensity exercise weekly for heart health, and weights twice weekly for bone health  Aim for 7-9 hours of sleep daily   Prediabetes Eating Plan Prediabetes-also called impaired glucose tolerance or impaired fasting glucose-is a condition that causes blood sugar (blood glucose) levels to be higher than normal. Following a healthy diet can help to keep prediabetes under control. It can also help to lower the risk of type 2 diabetes and heart disease, which are increased in people who have prediabetes. Along with regular exercise, a healthy diet:  Promotes weight loss.  Helps to control blood sugar levels.  Helps to improve the way that the body uses insulin.  What do I need to know about this eating plan?  Use the glycemic index (GI) to plan your meals. The index tells you how quickly a food will raise your blood sugar. Choose low-GI foods. These foods take a longer time to raise blood sugar.  Pay close attention to the amount of carbohydrates in the food that you eat. Carbohydrates increase blood sugar levels.  Keep track of how many calories you take in. Eating the right amount of calories will help you to achieve a healthy weight. Losing about 7 percent of your starting weight can help to prevent type 2 diabetes.  You may want to follow a Mediterranean diet. This diet includes a lot of vegetables, lean meats or fish, whole grains, fruits, and healthy oils and fats. What foods can I eat? Grains Whole grains, such as whole-wheat or whole-grain breads, crackers, cereals, and pasta. Unsweetened oatmeal. Bulgur. Barley. Quinoa. Brown rice. Corn or whole-wheat flour tortillas or  taco shells. Vegetables Lettuce. Spinach. Peas. Beets. Cauliflower. Cabbage. Broccoli. Carrots. Tomatoes. Squash. Eggplant. Herbs. Peppers. Onions. Cucumbers. Brussels sprouts. Fruits Berries. Bananas. Apples. Oranges. Grapes. Papaya. Mango. Pomegranate. Kiwi. Grapefruit. Cherries. Meats and Other Protein Sources Seafood. Lean meats, such as chicken and [place] or lean cuts of pork and beef. Tofu. Eggs. Nuts. Beans. Dairy Low-fat or fat-free dairy products, such as yogurt, cottage cheese, and cheese. Beverages Water. Tea. Coffee. Sugar-free or diet soda. Seltzer water. Milk. Milk alternatives, such as soy or almond milk. Condiments Mustard. Relish. Low-fat, low-sugar ketchup. Low-fat, low-sugar barbecue sauce. Low-fat or fat-free mayonnaise. Sweets and Desserts Sugar-free or low-fat pudding. Sugar-free or low-fat ice cream and other frozen treats. Fats and Oils Avocado. Walnuts. Olive oil. The items listed above may not be a complete list of recommended foods or beverages. Contact your dietitian for more options. What foods are not recommended? Grains Refined white flour and flour products, such as bread, pasta, snack foods, and cereals. Beverages Sweetened drinks, such as sweet iced tea and soda. Sweets and Desserts Baked goods, such as cake, cupcakes, pastries, cookies, and cheesecake. The items listed above may not be a complete list of foods and beverages to avoid. Contact your dietitian for more information. This information is not intended to replace advice given to you by your health care provider. Make sure you discuss any questions you have with your health care provider. Document Released: 03/19/2015 Document Revised: 04/09/2016 Document Reviewed: 11/28/2014 Elsevier Interactive Patient Education  2017 [employer].

## 2018-06-01 LAB — CBC WITH DIFFERENTIAL/PLATELET
Basophils Absolute: 50 cells/uL (ref 0–200)
Basophils Relative: 0.8 %
Eosinophils Absolute: 186 cells/uL (ref 15–500)
Eosinophils Relative: 3 %
HCT: 41.5 % (ref 35.0–45.0)
Hemoglobin: 13.8 g/dL (ref 11.7–15.5)
Lymphs Abs: 1445 cells/uL (ref 850–3900)
MCH: 29.1 pg (ref 27.0–33.0)
MCHC: 33.3 g/dL (ref 32.0–36.0)
MCV: 87.6 fL (ref 80.0–100.0)
MPV: 11.2 fL (ref 7.5–12.5)
Monocytes Relative: 12.2 %
Neutro Abs: 3763 cells/uL (ref 1500–7800)
Neutrophils Relative %: 60.7 %
Platelets: 179 10*3/uL (ref 140–400)
RBC: 4.74 10*6/uL (ref 3.80–5.10)
RDW: 13.5 % (ref 11.0–15.0)
Total Lymphocyte: 23.3 %
WBC mixed population: 756 cells/uL (ref 200–950)
WBC: 6.2 10*3/uL (ref 3.8–10.8)

## 2018-06-01 LAB — COMPLETE METABOLIC PANEL WITH GFR
AG Ratio: 1.9 (calc) (ref 1.0–2.5)
ALT: 14 U/L (ref 6–29)
AST: 20 U/L (ref 10–35)
Albumin: 4.3 g/dL (ref 3.6–5.1)
Alkaline phosphatase (APISO): 94 U/L (ref 33–130)
BUN/Creatinine Ratio: 20 (calc) (ref 6–22)
BUN: 19 mg/dL (ref 7–25)
CO2: 29 mmol/L (ref 20–32)
Calcium: 10 mg/dL (ref 8.6–10.4)
Chloride: 102 mmol/L (ref 98–110)
Creat: 0.94 mg/dL — ABNORMAL HIGH (ref 0.60–0.88)
GFR, Est African American: 65 mL/min/{1.73_m2} (ref >=60)
GFR, Est Non African American: 56 mL/min/{1.73_m2} — ABNORMAL LOW (ref >=60)
Globulin: 2.3 g/dL (calc) (ref 1.9–3.7)
Glucose, Bld: 135 mg/dL — ABNORMAL HIGH (ref 65–99)
Potassium: 4.1 mmol/L (ref 3.5–5.3)
Sodium: 141 mmol/L (ref 135–146)
Total Bilirubin: 0.7 mg/dL (ref 0.2–1.2)
Total Protein: 6.6 g/dL (ref 6.1–8.1)

## 2018-06-01 LAB — HEMOGLOBIN A1C
Hgb A1c MFr Bld: 6.3 % of total Hgb — ABNORMAL HIGH (ref <5.7)
Mean Plasma Glucose: 134 (calc)
eAG (mmol/L): 7.4 (calc)

## 2018-06-01 LAB — LIPID PANEL
Cholesterol: 209 mg/dL — ABNORMAL HIGH (ref <200)
HDL: 64 mg/dL (ref >50)
LDL Cholesterol (Calc): 126 mg/dL (calc) — ABNORMAL HIGH
Non-HDL Cholesterol (Calc): 145 mg/dL (calc) — ABNORMAL HIGH (ref <130)
Total CHOL/HDL Ratio: 3.3 (calc) (ref <5.0)
Triglycerides: 86 mg/dL (ref <150)

## 2018-06-01 LAB — TSH: TSH: 1.02 mIU/L (ref 0.40–4.50)

## 2018-06-06 ENCOUNTER — Other Ambulatory Visit: Payer: Self-pay | Admitting: Internal Medicine

## 2018-08-04 ENCOUNTER — Other Ambulatory Visit: Payer: Self-pay | Admitting: Internal Medicine

## 2018-08-31 DIAGNOSIS — L918 Other hypertrophic disorders of the skin: Secondary | ICD-10-CM | POA: Diagnosis not present

## 2018-08-31 DIAGNOSIS — L821 Other seborrheic keratosis: Secondary | ICD-10-CM | POA: Diagnosis not present

## 2018-08-31 DIAGNOSIS — Z85828 Personal history of other malignant neoplasm of skin: Secondary | ICD-10-CM | POA: Diagnosis not present

## 2018-08-31 DIAGNOSIS — L820 Inflamed seborrheic keratosis: Secondary | ICD-10-CM | POA: Diagnosis not present

## 2018-09-01 ENCOUNTER — Encounter: Payer: Self-pay | Admitting: Internal Medicine

## 2018-09-01 NOTE — Patient Instructions (Signed)

## 2018-09-01 NOTE — Progress Notes (Signed)
This very nice 84 y.o. WWF presents for 3 month follow up with HTN, HLD, Pre-Diabetes and Vitamin D Deficiency. Patient has GERD controlled with her meds. Patient is followed also by Dr Nelva Bush for [name] & had Lumbar Spinal Stenosis s/p L3/L5 SS Decompression by Dr Apolonio Schneiders in 2016. She also has hereditary /Familial Tremors & is on Propranolol.      Patient is treated for HTN (1996)  & BP has been controlled at home. Today's BP by the nurse was 148/78 and re-checked by myself x 3 and was elevated at 200/100. Patient has had no complaints of any cardiac type chest pain, palpitations, dyspnea / orthopnea / PND, dizziness, claudication, or dependent edema.     Hyperlipidemia is not controlled with diet. Last Lipids were not at goal: Lab Results  Component Value Date   CHOL 207 (H) 09/02/2018   HDL 54 09/02/2018   LDLCALC 133 (H) 09/02/2018   TRIG 92 09/02/2018   CHOLHDL 3.8 09/02/2018      Also, the patient has history of  Morbid Obesity (BMI 31+) and  T2_NIDDM (A1c 6.6%/2011)  and CKD3 which she is attempting to control her Diabetes w/diet.  She denies symptoms of reactive hypoglycemia, diabetic polys, paresthesias or visual blurring.  Last A1c was not at goal: Lab Results  Component Value Date   HGBA1C 6.7 (H) 09/02/2018      Further, the patient also has history of Vitamin D Deficiency  ("23"/2008)  and supplements vitamin D without any suspected side-effects. Last vitamin D was at goal:  Lab Results  Component Value Date   VD25OH 71 09/02/2018   Current Outpatient Medications on File Prior to Visit  Medication Sig  . albuterol (PROVENTIL HFA;VENTOLIN HFA) 108 (90 BASE) MCG/ACT inhaler Inhale 2 puffs into the lungs every 6 (six) hours as needed for wheezing or shortness of breath.  . Ascorbic Acid (VITAMIN C) 1000 MG tablet Take 1,000 mg by mouth daily.   Marland Kitchen aspirin 81 MG tablet Take 81 mg by mouth daily.  Marland Kitchen b complex vitamins tablet Take 1 tablet by mouth daily.  . bumetanide (BUMEX)  1 MG tablet TAKE 1/2-1  TABLET BY MOUTH TWICE DAILY AS NEEDED FOR SWELLING  . Calcium Carbonate (CALCIUM 600 PO) Take 600 mg by mouth every evening.  . Cholecalciferol (VITAMIN D3) 2000 UNITS TABS Take 2,000-4,000 Units by mouth 2 (two) times daily. Take 4000 units in the morning and 2000 units in the evening  . fluticasone (FLONASE) 50 MCG/ACT nasal spray Place 2 sprays into both nostrils daily. (Patient taking differently: Place 2 sprays into both nostrils as needed. )  . MELATONIN PO Take by mouth. Takes 1 at bedtime  . Misc Natural Products (OSTEO BI-FLEX JOINT SHIELD PO) Take 1 tablet by mouth 2 (two) times daily.   . propranolol ER (INDERAL LA) 80 MG 24 hr capsule TAKE 1 CAPSULE BY MOUTH ONCE DAILY FOR  TREMORS  . ranitidine (ZANTAC) 300 MG tablet TAKE 1 TABLET BY MOUTH TWICE DAILY FOR ACID REFLUX   No current facility-administered medications on file prior to visit.    Allergies  Allergen Reactions  . Accupril [Quinapril Hcl] Cough  . Neosporin [Neomycin-Bacitracin Zn-Polymyx]   . Prednisone     Agitated and shaky  . Reglan [Metoclopramide] Other (See Comments)    tremors  . Tramadol Nausea And Vomiting  . Adhesive [Tape] Rash   PMHx:   Past Medical History:  Diagnosis Date  . Cancer ([place])  skin cancer  . DDD (degenerative disc disease)   . Diabetes mellitus without complication ([place])    "pre-diabetes"  . Diverticulitis   . DJD (degenerative joint disease)   . GERD (gastroesophageal reflux disease)    takes ranitidine  . Heart murmur   . History of hiatal hernia   . History of pneumonia   . Hx of irritable bowel syndrome   . Hyperlipidemia   . Occasional tremors   . Osteoporosis   . Pre-diabetes   . Varicose veins   . Vitamin D deficiency    Immunization History  Administered Date(s) Administered  . DT 09/05/2015  . DTaP 11/16/2004  . Influenza Whole 08/30/2013  . Influenza, High Dose Seasonal PF 08/28/2015, 08/10/2016  . Pneumococcal Conjugate-13  10/19/2016  . Pneumococcal Polysaccharide-23 11/16/2001  . Zoster 06/01/2013   Past Surgical History:  Procedure Laterality Date  . APPENDECTOMY    . BREAST SURGERY Right    fluid drained from breast  . COLONOSCOPY    . ESOPHAGOGASTRODUODENOSCOPY    . JOINT REPLACEMENT Left    left hip  . LUMBAR LAMINECTOMY/DECOMPRESSION MICRODISCECTOMY N/A 06/05/2015   Procedure: L3-L5 DECOMPRESSION ;  Surgeon: Melina Schools, MD;  Location: [place];  Service: Orthopedics;  Laterality: N/A;  . OPEN REDUCTION INTERNAL FIXATION (ORIF) DISTAL RADIAL FRACTURE Right 01/10/2014   Procedure: OPEN REDUCTION INTERNAL FIXATION (ORIF) DISTAL RADIAL FRACTURE;  Surgeon: Linna Hoff, MD;  Location: [place];  Service: Orthopedics;  Laterality: Right;  . SPINAL CORD DECOMPRESSION  06/05/2015   L3 L 5  . THYROID SURGERY    . TONSILLECTOMY    . TUBAL LIGATION    . varicose veins stripped     FHx:    Reviewed / unchanged  SHx:    Reviewed / unchanged   Systems Review:  Constitutional: Denies fever, chills, wt changes, headaches, insomnia, fatigue, night sweats, change in appetite. Eyes: Denies redness, blurred vision, diplopia, discharge, itchy, watery eyes.  ENT: Denies discharge, congestion, post nasal drip, epistaxis, sore throat, earache, hearing loss, dental pain, tinnitus, vertigo, sinus pain, snoring.  CV: Denies chest pain, palpitations, irregular heartbeat, syncope, dyspnea, diaphoresis, orthopnea, PND, claudication or edema. Respiratory: denies cough, dyspnea, DOE, pleurisy, hoarseness, laryngitis, wheezing.  Gastrointestinal: Denies dysphagia, odynophagia, heartburn, reflux, water brash, abdominal pain or cramps, nausea, vomiting, bloating, diarrhea, constipation, hematemesis, melena, hematochezia  or hemorrhoids. Genitourinary: Denies dysuria, frequency, urgency, nocturia, hesitancy, discharge, hematuria or flank pain. Musculoskeletal: Denies arthralgias, myalgias, stiffness, jt. swelling, pain, limping or  strain/sprain.  Skin: Denies pruritus, rash, hives, warts, acne, eczema or change in skin lesion(s). Neuro: No weakness, tremor, incoordination, spasms, paresthesia or pain. Psychiatric: Denies confusion, memory loss or sensory loss. Endo: Denies change in weight, skin or hair change.  Heme/Lymph: No excessive bleeding, bruising or enlarged lymph nodes.  Physical Exam  BP (!) 148/78   Pulse 64   Temp 97.7 F (36.5 C)   Resp 16   Ht 5' 1.5" (1.562 m)   Wt 175 lb 3.2 oz (79.5 kg)   BMI 32.57 kg/m  BP re-checked  X 3  200/100  Appears  well nourished, well groomed  and in no distress.  Eyes: PERRLA, EOMs, conjunctiva no swelling or erythema. Sinuses: No frontal/maxillary tenderness ENT/Mouth: EAC's clear, TM's nl w/o erythema, bulging. Nares clear w/o erythema, swelling, exudates. Oropharynx clear without erythema or exudates. Oral hygiene is good. Tongue normal, non obstructing. Hearing intact.  Neck: Supple. Thyroid not palpable. Car 2+/2+ without bruits, nodes or JVD. Chest:  Respirations nl with BS clear & equal w/o rales, rhonchi, wheezing or stridor.  Cor: Heart sounds normal w/ regular rate and rhythm without sig. murmurs, gallops, clicks or rubs. Peripheral pulses normal and equal  without edema.  Abdomen: Soft & bowel sounds normal. Non-tender w/o guarding, rebound, hernias, masses or organomegaly.  Lymphatics: Unremarkable.  Musculoskeletal: Full ROM all peripheral extremities, joint stability, 5/5 strength and normal gait.  Skin: Warm, dry without exposed rashes, lesions or ecchymosis apparent.  Neuro: Cranial nerves intact, reflexes equal bilaterally. Sensory-motor testing grossly intact. Tendon reflexes grossly intact.  Pysch: Alert & oriented x 3.  Insight and judgement nl & appropriate. No ideations.  Assessment and Plan:  1. Essential hypertension  - Add new Rx Olmesartan 40 mg daily  & ROV 2 weeks to recheck  & monitor blood pressure at home.  - Continue DASH  diet.  Reminder to go to the ER if any CP,  SOB, nausea, dizziness, severe HA, changes vision/speech.  - CBC with Differential/Platelet - COMPLETE METABOLIC PANEL WITH GFR - Magnesium - TSH  2. Hyperlipidemia, mixed  - Continue diet/meds, exercise,& lifestyle modifications.  - Continue monitor periodic cholesterol/liver & renal functions   - Lipid panel - TSH  3. Type 2 diabetes mellitus with stage 3 chronic kidney disease, without long-term current use of insulin (HCC)  - Continue diet, exercise,  - lifestyle modifications.  - Monitor appropriate labs.  - Hemoglobin A1c - Insulin, random  4. Vitamin D deficiency  - Continue supplementation.   - VITAMIN D 25 Hydroxyl  5. GERD  - CBC with Differential/Platelet - COMPLETE METABOLIC PANEL WITH GFR - Magnesium - Lipid panel - TSH - Hemoglobin A1c - Insulin, random - VITAMIN D 25 Hydroxyl       Discussed  regular exercise, BP monitoring, weight control to achieve/maintain BMI less than 25 and discussed med and SE's. Recommended labs to assess and monitor clinical status with further disposition pending results of labs. Over 30 minutes of exam, counseling, chart review was performed.

## 2018-09-02 ENCOUNTER — Ambulatory Visit: Payer: Medicare Other | Admitting: Internal Medicine

## 2018-09-02 VITALS — BP 148/78 | HR 64 | Temp 97.7°F | Resp 16 | Ht 61.5 in | Wt 175.2 lb

## 2018-09-02 DIAGNOSIS — E782 Mixed hyperlipidemia: Secondary | ICD-10-CM

## 2018-09-02 DIAGNOSIS — Z79899 Other long term (current) drug therapy: Secondary | ICD-10-CM

## 2018-09-02 DIAGNOSIS — N183 Chronic kidney disease, stage 3 unspecified: Secondary | ICD-10-CM

## 2018-09-02 DIAGNOSIS — E1122 Type 2 diabetes mellitus with diabetic chronic kidney disease: Secondary | ICD-10-CM | POA: Diagnosis not present

## 2018-09-02 DIAGNOSIS — E559 Vitamin D deficiency, unspecified: Secondary | ICD-10-CM | POA: Diagnosis not present

## 2018-09-02 DIAGNOSIS — K2100 Gastro-esophageal reflux disease with esophagitis, without bleeding: Secondary | ICD-10-CM

## 2018-09-02 DIAGNOSIS — I10 Essential (primary) hypertension: Secondary | ICD-10-CM | POA: Diagnosis not present

## 2018-09-03 ENCOUNTER — Encounter: Payer: Self-pay | Admitting: Internal Medicine

## 2018-09-05 ENCOUNTER — Other Ambulatory Visit: Payer: Self-pay | Admitting: Internal Medicine

## 2018-09-05 DIAGNOSIS — I10 Essential (primary) hypertension: Secondary | ICD-10-CM

## 2018-09-05 LAB — CBC WITH DIFFERENTIAL/PLATELET
Basophils Absolute: 39 cells/uL (ref 0–200)
Basophils Relative: 0.7 %
Eosinophils Absolute: 179 cells/uL (ref 15–500)
Eosinophils Relative: 3.2 %
HCT: 40.6 % (ref 35.0–45.0)
Hemoglobin: 13.6 g/dL (ref 11.7–15.5)
Lymphs Abs: 1154 cells/uL (ref 850–3900)
MCH: 29.4 pg (ref 27.0–33.0)
MCHC: 33.5 g/dL (ref 32.0–36.0)
MCV: 87.9 fL (ref 80.0–100.0)
MPV: 11.6 fL (ref 7.5–12.5)
Monocytes Relative: 12.6 %
Neutro Abs: 3522 cells/uL (ref 1500–7800)
Neutrophils Relative %: 62.9 %
Platelets: 181 10*3/uL (ref 140–400)
RBC: 4.62 10*6/uL (ref 3.80–5.10)
RDW: 13.2 % (ref 11.0–15.0)
Total Lymphocyte: 20.6 %
WBC mixed population: 706 cells/uL (ref 200–950)
WBC: 5.6 10*3/uL (ref 3.8–10.8)

## 2018-09-05 LAB — COMPLETE METABOLIC PANEL WITH GFR
AG Ratio: 2.1 (calc) (ref 1.0–2.5)
ALT: 16 U/L (ref 6–29)
AST: 19 U/L (ref 10–35)
Albumin: 4.2 g/dL (ref 3.6–5.1)
Alkaline phosphatase (APISO): 99 U/L (ref 33–130)
BUN/Creatinine Ratio: 21 (calc) (ref 6–22)
BUN: 20 mg/dL (ref 7–25)
CO2: 28 mmol/L (ref 20–32)
Calcium: 10 mg/dL (ref 8.6–10.4)
Chloride: 104 mmol/L (ref 98–110)
Creat: 0.94 mg/dL — ABNORMAL HIGH (ref 0.60–0.88)
GFR, Est African American: 65 mL/min/{1.73_m2} (ref >=60)
GFR, Est Non African American: 56 mL/min/{1.73_m2} — ABNORMAL LOW (ref >=60)
Globulin: 2 g/dL (calc) (ref 1.9–3.7)
Glucose, Bld: 128 mg/dL — ABNORMAL HIGH (ref 65–99)
Potassium: 4.4 mmol/L (ref 3.5–5.3)
Sodium: 142 mmol/L (ref 135–146)
Total Bilirubin: 0.6 mg/dL (ref 0.2–1.2)
Total Protein: 6.2 g/dL (ref 6.1–8.1)

## 2018-09-05 LAB — LIPID PANEL
Cholesterol: 207 mg/dL — ABNORMAL HIGH (ref <200)
HDL: 54 mg/dL (ref >50)
LDL Cholesterol (Calc): 133 mg/dL (calc) — ABNORMAL HIGH
Non-HDL Cholesterol (Calc): 153 mg/dL (calc) — ABNORMAL HIGH (ref <130)
Total CHOL/HDL Ratio: 3.8 (calc) (ref <5.0)
Triglycerides: 92 mg/dL (ref <150)

## 2018-09-05 LAB — HEMOGLOBIN A1C
Hgb A1c MFr Bld: 6.7 % of total Hgb — ABNORMAL HIGH (ref <5.7)
Mean Plasma Glucose: 146 (calc)
eAG (mmol/L): 8.1 (calc)

## 2018-09-05 LAB — VITAMIN D 25 HYDROXY (VIT D DEFICIENCY, FRACTURES): Vit D, 25-Hydroxy: 71 ng/mL (ref 30–100)

## 2018-09-05 LAB — TSH: TSH: 1 mIU/L (ref 0.40–4.50)

## 2018-09-05 LAB — MAGNESIUM: Magnesium: 2.1 mg/dL (ref 1.5–2.5)

## 2018-09-05 LAB — INSULIN, RANDOM: Insulin: 10.3 u[IU]/mL (ref 2.0–19.6)

## 2018-09-05 MED ORDER — TELMISARTAN 40 MG PO TABS: ORAL_TABLET | ORAL | 3 refills | Status: DC

## 2018-09-16 ENCOUNTER — Ambulatory Visit (INDEPENDENT_AMBULATORY_CARE_PROVIDER_SITE_OTHER): Payer: Medicare Other | Admitting: Internal Medicine

## 2018-09-16 ENCOUNTER — Encounter: Payer: Self-pay | Admitting: Internal Medicine

## 2018-09-16 VITALS — BP 164/84 | HR 64 | Temp 97.9°F | Resp 16 | Ht 61.5 in | Wt 172.4 lb

## 2018-09-16 DIAGNOSIS — M1712 Unilateral primary osteoarthritis, left knee: Secondary | ICD-10-CM | POA: Diagnosis not present

## 2018-09-16 DIAGNOSIS — I10 Essential (primary) hypertension: Secondary | ICD-10-CM

## 2018-09-16 MED ORDER — ATENOLOL 100 MG PO TABS: ORAL_TABLET | ORAL | 3 refills | Status: DC

## 2018-09-16 MED ORDER — DEXAMETHASONE 0.5 MG PO TABS: ORAL_TABLET | ORAL | 0 refills | Status: DC

## 2018-09-16 NOTE — Progress Notes (Signed)
Subjective:    Patient ID: [name], female    DOB: 1934/10/19, 84 y.o.   MRN: [id number]  HPI    This very nice 84 yo WWF w/hx/o HTN (1996) was seen 2 weeks ago w/accelerated HTN (BP 200/100) & had Olmesartan 40 mg added with her Inderal 80 mg LA / Bumex 1 mg. She reports BP's have been running in the 140-150's/80-90's range. Denies any HA's , dizziness, CP, palpitations, dyspnea or edema. Also, she's c/o Lt Knee pains . Denies injuries.  Medication Sig  . albuterol (PROVENTIL HFA;VENTOLIN HFA) 108 (90 BASE) MCG/ACT inhaler Inhale 2 puffs into the lungs every 6 (six) hours as needed for wheezing or shortness of breath.  . Ascorbic Acid (VITAMIN C) 1000 MG tablet Take 1,000 mg by mouth daily.   Marland Kitchen aspirin 81 MG tablet Take 81 mg by mouth daily.  Marland Kitchen b complex vitamins tablet Take 1 tablet by mouth daily.  . bumetanide (BUMEX) 1 MG tablet TAKE 1/2-1  TABLET BY MOUTH TWICE DAILY AS NEEDED FOR SWELLING  . Calcium Carbonate (CALCIUM 600 PO) Take 600 mg by mouth every evening.  . Cholecalciferol (VITAMIN D3) 2000 UNITS TABS Take 2,000-4,000 Units by mouth 2 (two) times daily. Take 4000 units in the morning and 2000 units in the evening  . fluticasone (FLONASE) 50 MCG/ACT nasal spray Place 2 sprays into both nostrils daily. (Patient taking differently: Place 2 sprays into both nostrils as needed. )  . MELATONIN PO Take by mouth. Takes 1 at bedtime  . Misc Natural Products (OSTEO BI-FLEX JOINT SHIELD PO) Take 1 tablet by mouth 2 (two) times daily.   . propranolol ER (INDERAL LA) 80 MG 24 hr capsule TAKE 1 CAPSULE BY MOUTH ONCE DAILY FOR  TREMORS  . ranitidine (ZANTAC) 300 MG tablet TAKE 1 TABLET BY MOUTH TWICE DAILY FOR ACID REFLUX  . telmisartan (MICARDIS) 40 MG tablet Take 1 tablet every night for BP   Allergies  Allergen Reactions  . Accupril [Quinapril Hcl] Cough  . Neosporin [Neomycin-Bacitracin Zn-Polymyx]   . Prednisone     Agitated and shaky  . Reglan [Metoclopramide] Other (See  Comments)    tremors  . Tramadol Nausea And Vomiting  . Adhesive [Tape] Rash   Past Medical History:  Diagnosis Date  . Cancer ([place])    skin cancer  . DDD (degenerative disc disease)   . Diabetes mellitus without complication ([place])    "pre-diabetes"  . Diverticulitis   . DJD (degenerative joint disease)   . GERD (gastroesophageal reflux disease)    takes ranitidine  . Heart murmur   . History of hiatal hernia   . History of pneumonia   . Hx of irritable bowel syndrome   . Hyperlipidemia   . Occasional tremors   . Osteoporosis   . Pre-diabetes   . Varicose veins   . Vitamin D deficiency    Review of Systems  10 point systems review negative except as above.    Objective:   Physical Exam  BP (!) 164/84   Pulse 64   Temp 97.9 F (36.6 C)   Resp 16   Ht 5' 1.5" (1.562 m)   Wt 172 lb 6.4 oz (78.2 kg)   BMI 32.05 kg/m  Recheck BP 158/86.   In no distress.   HEENT - WNL. Neck - supple.  Chest - Clear equal BS. Cor - Nl HS. RRR w/o sig MGR. PP 1(+). No edema. MS- FROM w/o deformities.  (+) tender  Lt knee Lateral joint line.. No crepitus. No effusion or synovitis. Gait Nl. Neuro -  Nl w/o focal abnormalities.    Assessment & Plan:   1. Essential hypertension  - atenolol (TENORMIN) 100 MG tablet; Take 1 tablet every morning for BP & Tremor  Dispense: 90 tablet; Refill: 3  2. Osteoarthritis of left knee, unspecified osteoarthritis type  - dexamethasone (DECADRON) 0.5 MG tablet; Take 1 tab 3 x day - 3 days, then 2 x day - 3 days, then 1 tab daily  Dispense: 20 tablet; Refill: 0     .

## 2018-09-30 ENCOUNTER — Encounter: Payer: Self-pay | Admitting: Internal Medicine

## 2018-09-30 ENCOUNTER — Ambulatory Visit (INDEPENDENT_AMBULATORY_CARE_PROVIDER_SITE_OTHER): Payer: Medicare Other | Admitting: Internal Medicine

## 2018-09-30 VITALS — BP 148/82 | HR 59 | Temp 98.2°F | Ht 61.5 in | Wt 171.6 lb

## 2018-09-30 DIAGNOSIS — I10 Essential (primary) hypertension: Secondary | ICD-10-CM | POA: Diagnosis not present

## 2018-09-30 NOTE — Progress Notes (Signed)
   Subjective:    Patient ID: [name], female    DOB: 25-Jan-1934, 84 y.o.   MRN: [id number]  HPI      Patient is a very nice 84 yo WWF with hx/o recent accelerated HTN (BP 200/100) initially had Benicar 40 & HCTZ 25 added with her Inderal which at last visit was switched to Atenolol for $$ concerns . She reports BP's have been running in the 140-150's/80-90's range. Denies any HA's , dizziness, CP, palpitations, dyspnea or edema. Also, she's c/o Lt Knee pains . Denies injuries.  Medication Sig  . albuterol HFA  inhaler Inhale 2 puffs every 6  hrs as needed   . VITAMIN C 1000 MG tablet Take  daily.   Marland Kitchen aspirin 81 MG tablet Take  daily.  Marland Kitchen atenolol  100 MG tablet Take 1 tablet every morning for BP & Tremor  . b complex vitamins tablet Take 1 tablet  daily.  . bumetanide  1 MG tablet TAKE 1/2-1  TABLET TWICE DAILY AS NEEDED   . VITAMIN D  2000 UNITS  Take 4000 units morning and 2000 units evening  . FLONASE  nasal spray Place 2 sprays into both nostrils daily. (Patient taking differently: Place 2 sprays into both nostrils as needed. )  . MELATONIN Take by mouth. Takes 1 at bedtime  . Misc Natural Products (OSTEO BI-FLEX JOINT SHIELD PO) Take 1 tablet by  2  times daily.   . ranitidine  300 MG tablet TAKE 1 TABLET  TWICE DAILY FOR ACID REFLUX  . telmisartan  40 MG tablet Take 1 tablet every night for BP  . Calcium Carbonate 600mg  Take 600 mg by mouth every evening.   Allergies  Allergen Reactions  . Accupril [Quinapril Hcl] Cough  . Neosporin [Neomycin-Bacitracin Zn-Polymyx]   . Prednisone     Agitated and shaky  . Reglan [Metoclopramide] Other (See Comments)    tremors  . Tramadol Nausea And Vomiting  . Adhesive [Tape] Rash       10 point systems review negative except as above.    Objective:   Physical Exam  BP (!) 148/82   Pulse (!) 59   Temp 98.2 F (36.8 C)   Ht 5' 1.5" (1.562 m)   Wt 171 lb 9.6 oz (77.8 kg)   SpO2 91%   BMI 31.90 kg/m   HEENT - WNL. Neck -  supple.  Chest - Clear equal BS. Cor - Nl HS. RRR w/o sig MGR. PP 1(+). No edema. MS- FROM w/o deformities.  Gait Nl. Neuro -  Nl w/o focal abnormalities.    Assessment & Plan:   1. Essential hypertension  - continue meds same and monitor BP's & call if BP > 145.95

## 2018-10-02 ENCOUNTER — Encounter: Payer: Self-pay | Admitting: Internal Medicine

## 2018-11-01 DIAGNOSIS — H04123 Dry eye syndrome of bilateral lacrimal glands: Secondary | ICD-10-CM | POA: Diagnosis not present

## 2018-11-01 DIAGNOSIS — H25813 Combined forms of age-related cataract, bilateral: Secondary | ICD-10-CM | POA: Diagnosis not present

## 2018-11-01 DIAGNOSIS — E119 Type 2 diabetes mellitus without complications: Secondary | ICD-10-CM | POA: Diagnosis not present

## 2018-11-01 DIAGNOSIS — H353131 Nonexudative age-related macular degeneration, bilateral, early dry stage: Secondary | ICD-10-CM | POA: Diagnosis not present

## 2018-11-01 LAB — HM DIABETES EYE EXAM

## 2018-11-04 ENCOUNTER — Encounter: Payer: Self-pay | Admitting: *Deleted

## 2018-11-21 ENCOUNTER — Encounter (HOSPITAL_COMMUNITY): Payer: Self-pay | Admitting: *Deleted

## 2018-11-21 ENCOUNTER — Observation Stay (HOSPITAL_COMMUNITY)
Admission: EM | Admit: 2018-11-21 | Discharge: 2018-11-24 | Disposition: A | Discharge status: Home / Self Care | Payer: Medicare Other | Attending: Internal Medicine | Admitting: Internal Medicine

## 2018-11-21 ENCOUNTER — Emergency Department (HOSPITAL_COMMUNITY): Payer: Medicare Other

## 2018-11-21 ENCOUNTER — Telehealth: Payer: Self-pay | Admitting: *Deleted

## 2018-11-21 DIAGNOSIS — E785 Hyperlipidemia, unspecified: Secondary | ICD-10-CM | POA: Diagnosis not present

## 2018-11-21 DIAGNOSIS — K589 Irritable bowel syndrome without diarrhea: Secondary | ICD-10-CM | POA: Diagnosis not present

## 2018-11-21 DIAGNOSIS — Z8249 Family history of ischemic heart disease and other diseases of the circulatory system: Secondary | ICD-10-CM | POA: Insufficient documentation

## 2018-11-21 DIAGNOSIS — R2681 Unsteadiness on feet: Secondary | ICD-10-CM | POA: Diagnosis not present

## 2018-11-21 DIAGNOSIS — R000 Tachycardia, unspecified: Secondary | ICD-10-CM | POA: Diagnosis not present

## 2018-11-21 DIAGNOSIS — N183 Chronic kidney disease, stage 3 unspecified: Secondary | ICD-10-CM | POA: Diagnosis present

## 2018-11-21 DIAGNOSIS — Z79899 Other long term (current) drug therapy: Secondary | ICD-10-CM | POA: Insufficient documentation

## 2018-11-21 DIAGNOSIS — Z888 Allergy status to other drugs, medicaments and biological substances status: Secondary | ICD-10-CM | POA: Diagnosis not present

## 2018-11-21 DIAGNOSIS — E559 Vitamin D deficiency, unspecified: Secondary | ICD-10-CM | POA: Insufficient documentation

## 2018-11-21 DIAGNOSIS — I4891 Unspecified atrial fibrillation: Secondary | ICD-10-CM | POA: Diagnosis not present

## 2018-11-21 DIAGNOSIS — E1169 Type 2 diabetes mellitus with other specified complication: Secondary | ICD-10-CM | POA: Diagnosis present

## 2018-11-21 DIAGNOSIS — I4510 Unspecified right bundle-branch block: Secondary | ICD-10-CM | POA: Insufficient documentation

## 2018-11-21 DIAGNOSIS — I700 Atherosclerosis of aorta: Secondary | ICD-10-CM | POA: Diagnosis not present

## 2018-11-21 DIAGNOSIS — I130 Hypertensive heart and chronic kidney disease with heart failure and stage 1 through stage 4 chronic kidney disease, or unspecified chronic kidney disease: Principal | ICD-10-CM | POA: Insufficient documentation

## 2018-11-21 DIAGNOSIS — M810 Age-related osteoporosis without current pathological fracture: Secondary | ICD-10-CM | POA: Insufficient documentation

## 2018-11-21 DIAGNOSIS — M6281 Muscle weakness (generalized): Secondary | ICD-10-CM | POA: Insufficient documentation

## 2018-11-21 DIAGNOSIS — E1122 Type 2 diabetes mellitus with diabetic chronic kidney disease: Secondary | ICD-10-CM | POA: Diagnosis not present

## 2018-11-21 DIAGNOSIS — I5033 Acute on chronic diastolic (congestive) heart failure: Secondary | ICD-10-CM

## 2018-11-21 DIAGNOSIS — Z96642 Presence of left artificial hip joint: Secondary | ICD-10-CM | POA: Diagnosis not present

## 2018-11-21 DIAGNOSIS — Z8042 Family history of malignant neoplasm of prostate: Secondary | ICD-10-CM | POA: Diagnosis not present

## 2018-11-21 DIAGNOSIS — R7309 Other abnormal glucose: Secondary | ICD-10-CM | POA: Diagnosis present

## 2018-11-21 DIAGNOSIS — R011 Cardiac murmur, unspecified: Secondary | ICD-10-CM | POA: Insufficient documentation

## 2018-11-21 DIAGNOSIS — I4819 Other persistent atrial fibrillation: Secondary | ICD-10-CM | POA: Diagnosis not present

## 2018-11-21 DIAGNOSIS — I313 Pericardial effusion (noninflammatory): Secondary | ICD-10-CM | POA: Insufficient documentation

## 2018-11-21 DIAGNOSIS — R7989 Other specified abnormal findings of blood chemistry: Secondary | ICD-10-CM | POA: Diagnosis not present

## 2018-11-21 DIAGNOSIS — I509 Heart failure, unspecified: Secondary | ICD-10-CM | POA: Diagnosis not present

## 2018-11-21 DIAGNOSIS — E782 Mixed hyperlipidemia: Secondary | ICD-10-CM | POA: Diagnosis present

## 2018-11-21 DIAGNOSIS — K219 Gastro-esophageal reflux disease without esophagitis: Secondary | ICD-10-CM | POA: Insufficient documentation

## 2018-11-21 DIAGNOSIS — R0602 Shortness of breath: Secondary | ICD-10-CM | POA: Diagnosis not present

## 2018-11-21 DIAGNOSIS — R05 Cough: Secondary | ICD-10-CM | POA: Diagnosis not present

## 2018-11-21 DIAGNOSIS — R7303 Prediabetes: Secondary | ICD-10-CM

## 2018-11-21 DIAGNOSIS — I444 Left anterior fascicular block: Secondary | ICD-10-CM | POA: Insufficient documentation

## 2018-11-21 DIAGNOSIS — K449 Diaphragmatic hernia without obstruction or gangrene: Secondary | ICD-10-CM | POA: Insufficient documentation

## 2018-11-21 DIAGNOSIS — Z85828 Personal history of other malignant neoplasm of skin: Secondary | ICD-10-CM | POA: Insufficient documentation

## 2018-11-21 DIAGNOSIS — Z7982 Long term (current) use of aspirin: Secondary | ICD-10-CM | POA: Insufficient documentation

## 2018-11-21 DIAGNOSIS — I10 Essential (primary) hypertension: Secondary | ICD-10-CM | POA: Diagnosis present

## 2018-11-21 HISTORY — DX: Acute on chronic diastolic (congestive) heart failure: I50.33

## 2018-11-21 LAB — CBC WITH DIFFERENTIAL/PLATELET
Abs Immature Granulocytes: 0.04 10*3/uL (ref 0.00–0.07)
Abs Immature Granulocytes: 0.04 10*3/uL (ref 0.00–0.07)
Basophils Absolute: 0 10*3/uL (ref 0.0–0.1)
Basophils Absolute: 0 10*3/uL (ref 0.0–0.1)
Basophils Relative: 1 %
Basophils Relative: 1 %
Eosinophils Absolute: 0.1 10*3/uL (ref 0.0–0.5)
Eosinophils Absolute: 0.1 10*3/uL (ref 0.0–0.5)
Eosinophils Relative: 2 %
Eosinophils Relative: 2 %
HCT: 41.2 % (ref 36.0–46.0)
HCT: 40.8 % (ref 36.0–46.0)
Hemoglobin: 12.8 g/dL (ref 12.0–15.0)
Hemoglobin: 13.1 g/dL (ref 12.0–15.0)
Immature Granulocytes: 1 %
Immature Granulocytes: 1 %
Lymphocytes Relative: 18 %
Lymphocytes Relative: 25 %
Lymphs Abs: 0.9 10*3/uL (ref 0.7–4.0)
Lymphs Abs: 1.5 10*3/uL (ref 0.7–4.0)
MCH: 29.1 pg (ref 26.0–34.0)
MCH: 30 pg (ref 26.0–34.0)
MCHC: 31.1 g/dL (ref 30.0–36.0)
MCHC: 32.1 g/dL (ref 30.0–36.0)
MCV: 93.6 fL (ref 80.0–100.0)
MCV: 93.6 fL (ref 80.0–100.0)
Monocytes Absolute: 0.6 10*3/uL (ref 0.1–1.0)
Monocytes Absolute: 0.8 10*3/uL (ref 0.1–1.0)
Monocytes Relative: 11 %
Monocytes Relative: 14 %
Neutro Abs: 3.4 10*3/uL (ref 1.7–7.7)
Neutro Abs: 3.6 10*3/uL (ref 1.7–7.7)
Neutrophils Relative %: 67 %
Neutrophils Relative %: 57 %
Platelets: 179 10*3/uL (ref 150–400)
Platelets: 201 10*3/uL (ref 150–400)
RBC: 4.4 MIL/uL (ref 3.87–5.11)
RBC: 4.36 MIL/uL (ref 3.87–5.11)
RDW: 14.6 % (ref 11.5–15.5)
RDW: 14.8 % (ref 11.5–15.5)
WBC: 5.1 10*3/uL (ref 4.0–10.5)
WBC: 6.1 10*3/uL (ref 4.0–10.5)
nRBC: 0 % (ref 0.0–0.2)
nRBC: 0 % (ref 0.0–0.2)

## 2018-11-21 LAB — GLUCOSE, CAPILLARY: Glucose-Capillary: 95 mg/dL (ref 70–99)

## 2018-11-21 LAB — HEMOGLOBIN A1C
Hgb A1c MFr Bld: 6.2 % — ABNORMAL HIGH (ref 4.8–5.6)
Mean Plasma Glucose: 131.24 mg/dL

## 2018-11-21 LAB — BASIC METABOLIC PANEL
Anion gap: 9 (ref 5–15)
BUN: 16 mg/dL (ref 8–23)
CO2: 23 mmol/L (ref 22–32)
Calcium: 9.6 mg/dL (ref 8.9–10.3)
Chloride: 107 mmol/L (ref 98–111)
Creatinine, Ser: 0.91 mg/dL (ref 0.44–1.00)
GFR calc Af Amer: >60 mL/min (ref >60)
GFR calc non Af Amer: 59 mL/min — ABNORMAL LOW (ref >60)
Glucose, Bld: 164 mg/dL — ABNORMAL HIGH (ref 70–99)
Potassium: 4 mmol/L (ref 3.5–5.1)
Sodium: 139 mmol/L (ref 135–145)

## 2018-11-21 LAB — TROPONIN I: Troponin I: <0.03 ng/mL (ref <0.03)

## 2018-11-21 LAB — D-DIMER, QUANTITATIVE: D-Dimer, Quant: 1.42 ug/mL-FEU — ABNORMAL HIGH (ref 0.00–0.50)

## 2018-11-21 LAB — BRAIN NATRIURETIC PEPTIDE: B Natriuretic Peptide: 391.6 pg/mL — ABNORMAL HIGH (ref 0.0–100.0)

## 2018-11-21 MED ORDER — ATENOLOL 100 MG PO TABS
100.0000 mg | ORAL_TABLET | Freq: Every day | ORAL | Status: DC
  Administered 2018-11-22 – 2018-11-24 (×3): 100 mg via ORAL
  Filled 2018-11-21 (×3): qty 1

## 2018-11-21 MED ORDER — ASPIRIN EC 81 MG PO TBEC
81.0000 mg | DELAYED_RELEASE_TABLET | Freq: Every day | ORAL | Status: DC
  Administered 2018-11-22 – 2018-11-23 (×2): 81 mg via ORAL
  Filled 2018-11-21 (×2): qty 1

## 2018-11-21 MED ORDER — IOPAMIDOL (ISOVUE-370) INJECTION 76%
INTRAVENOUS | Status: AC
  Administered 2018-11-21: 80 mL
  Filled 2018-11-21: qty 100

## 2018-11-21 MED ORDER — FUROSEMIDE 10 MG/ML IJ SOLN
40.0000 mg | Freq: Once | INTRAMUSCULAR | Status: AC
  Administered 2018-11-22: 40 mg via INTRAVENOUS
  Filled 2018-11-21: qty 4

## 2018-11-21 MED ORDER — ASPIRIN 81 MG PO CHEW
324.0000 mg | CHEWABLE_TABLET | Freq: Once | ORAL | Status: AC
  Administered 2018-11-21: 324 mg via ORAL
  Filled 2018-11-21: qty 4

## 2018-11-21 MED ORDER — ACETAMINOPHEN 325 MG PO TABS: 650.0000 mg | ORAL_TABLET | ORAL | Status: DC | PRN

## 2018-11-21 MED ORDER — BUMETANIDE 1 MG PO TABS
1.0000 mg | ORAL_TABLET | Freq: Every day | ORAL | Status: DC
  Administered 2018-11-22 – 2018-11-24 (×3): 1 mg via ORAL
  Filled 2018-11-21 (×3): qty 1

## 2018-11-21 MED ORDER — ONDANSETRON HCL 4 MG/2ML IJ SOLN
4.0000 mg | Freq: Four times a day (QID) | INTRAMUSCULAR | Status: DC | PRN
  Administered 2018-11-23: 4 mg via INTRAVENOUS
  Filled 2018-11-21: qty 2

## 2018-11-21 MED ORDER — ONDANSETRON HCL 4 MG/2ML IJ SOLN: 4.0000 mg | Freq: Four times a day (QID) | INTRAMUSCULAR | Status: DC | PRN

## 2018-11-21 MED ORDER — ENOXAPARIN SODIUM 80 MG/0.8ML ~~LOC~~ SOLN
80.0000 mg | SUBCUTANEOUS | Status: DC
  Filled 2018-11-21 (×2): qty 0.8

## 2018-11-21 MED ORDER — SODIUM CHLORIDE 0.9 % IV SOLN: 250.0000 mL | INTRAVENOUS | Status: DC | PRN

## 2018-11-21 MED ORDER — FENTANYL CITRATE (PF) 100 MCG/2ML IJ SOLN
50.0000 ug | Freq: Once | INTRAMUSCULAR | Status: AC
  Administered 2018-11-21: 50 ug via INTRAVENOUS
  Filled 2018-11-21: qty 2

## 2018-11-21 MED ORDER — MELATONIN 5 MG PO TABS
10.0000 mg | ORAL_TABLET | Freq: Every day | ORAL | Status: DC
  Filled 2018-11-21 (×2): qty 2

## 2018-11-21 MED ORDER — SODIUM CHLORIDE 0.9% FLUSH: 3.0000 mL | Freq: Two times a day (BID) | INTRAVENOUS | Status: DC

## 2018-11-21 MED ORDER — MELATONIN 3 MG PO TABS
9.0000 mg | ORAL_TABLET | Freq: Every day | ORAL | Status: DC
  Administered 2018-11-22 – 2018-11-23 (×3): 9 mg via ORAL
  Filled 2018-11-21 (×3): qty 3

## 2018-11-21 MED ORDER — FUROSEMIDE 10 MG/ML IJ SOLN
40.0000 mg | Freq: Once | INTRAMUSCULAR | Status: AC
  Administered 2018-11-21: 40 mg via INTRAVENOUS
  Filled 2018-11-21: qty 4

## 2018-11-21 MED ORDER — ENOXAPARIN SODIUM 80 MG/0.8ML ~~LOC~~ SOLN
75.0000 mg | Freq: Two times a day (BID) | SUBCUTANEOUS | Status: DC
  Administered 2018-11-22 – 2018-11-23 (×5): 75 mg via SUBCUTANEOUS
  Filled 2018-11-21: qty 0.75
  Filled 2018-11-21 (×4): qty 0.8

## 2018-11-21 MED ORDER — SODIUM CHLORIDE 0.9% FLUSH: 3.0000 mL | INTRAVENOUS | Status: DC | PRN

## 2018-11-21 MED ORDER — DILTIAZEM HCL 25 MG/5ML IV SOLN
10.0000 mg | Freq: Once | INTRAVENOUS | Status: AC
  Administered 2018-11-21: 10 mg via INTRAVENOUS
  Filled 2018-11-21: qty 5

## 2018-11-21 MED ORDER — FUROSEMIDE 10 MG/ML IJ SOLN: 40.0000 mg | Freq: Two times a day (BID) | INTRAMUSCULAR | Status: DC

## 2018-11-21 NOTE — ED Notes (Signed)
Patient transported to CT 

## 2018-11-21 NOTE — ED Triage Notes (Signed)
Pt in c/o lower back pain and shortness of breath with cough for the last few days, called her PCP and told to come to ED for evaluation, no distress on arrival

## 2018-11-21 NOTE — H&P (Signed)
History and Physical    Briana [name] HFW:[id number] DOB: August 02, 1934 DOA: 11/21/2018  PCP: Unk Pinto, MD Consultants:  Claiborne Billings - cardiology Patient coming from:  Home - lives alone; NOK:  Daughter, Briana [name], [phone]   Chief Complaint: SOB  HPI: Briana [name] is a 85 y.o. female with medical history significant of pre-DM; HLD; and diverticulitis presenting with SOB.  Friday, she was having trouble breathing and she wasn't sure why.  Saturday, she stayed around the house.  Yesterday, she went to church and sang in the choir.  This AM, her "left lung was hurting and I figured I probably had fluid in it."  Rare cough, nonproductive.  Slight rhinorrhea this AM.  No fever.   ED Course:  Mild, new-onset CHF with new-onset afib.  SOB since Friday with nonspecific CP. Negative troponin.  Afib, relative rate control.  Ruled out PE.  Trace edema, maybe infectious.  Giving Lasix and Cardizem.    Review of Systems: As per HPI; otherwise review of systems reviewed and negative.   Ambulatory Status:  Ambulates without assistance  Past Medical History:  Diagnosis Date  . Cancer ([place])    skin cancer  . DDD (degenerative disc disease)   . Diverticulitis   . DJD (degenerative joint disease)   . GERD (gastroesophageal reflux disease)    takes ranitidine  . Heart murmur   . History of hiatal hernia   . History of pneumonia   . Hx of irritable bowel syndrome   . Hyperlipidemia   . Occasional tremors   . Osteoporosis   . Pre-diabetes   . Varicose veins   . Vitamin D deficiency     Past Surgical History:  Procedure Laterality Date  . APPENDECTOMY    . BREAST SURGERY Right    fluid drained from breast  . COLONOSCOPY    . ESOPHAGOGASTRODUODENOSCOPY    . JOINT REPLACEMENT Left    left hip  . LUMBAR LAMINECTOMY/DECOMPRESSION MICRODISCECTOMY N/A 06/05/2015   Procedure: L3-L5 DECOMPRESSION ;  Surgeon: Melina Schools, MD;  Location: [place];  Service: Orthopedics;  Laterality: N/A;  . OPEN  REDUCTION INTERNAL FIXATION (ORIF) DISTAL RADIAL FRACTURE Right 01/10/2014   Procedure: OPEN REDUCTION INTERNAL FIXATION (ORIF) DISTAL RADIAL FRACTURE;  Surgeon: Linna Hoff, MD;  Location: [place];  Service: Orthopedics;  Laterality: Right;  . SPINAL CORD DECOMPRESSION  06/05/2015   L3 L 5  . THYROID SURGERY    . TONSILLECTOMY    . TUBAL LIGATION    . varicose veins stripped      Social History   Socioeconomic History  . Marital status: Widowed    Spouse name: Not on file  . Number of children: Not on file  . Years of education: Not on file  . Highest education level: Not on file  Occupational History  . Not on file  Social Needs  . Financial resource strain: Not on file  . Food insecurity:    Worry: Not on file    Inability: Not on file  . Transportation needs:    Medical: Not on file    Non-medical: Not on file  Tobacco Use  . Smoking status: Never Smoker  . Smokeless tobacco: Never Used  Substance and Sexual Activity  . Alcohol use: No  . Drug use: No  . Sexual activity: Not on file  Lifestyle  . Physical activity:    Days per week: Not on file    Minutes per session: Not on file  .  Stress: Not on file  Relationships  . Social connections:    Talks on phone: Not on file    Gets together: Not on file    Attends religious service: Not on file    Active member of club or organization: Not on file    Attends meetings of clubs or organizations: Not on file    Relationship status: Not on file  . Intimate partner violence:    Fear of current or ex partner: Not on file    Emotionally abused: Not on file    Physically abused: Not on file    Forced sexual activity: Not on file  Other Topics Concern  . Not on file  Social History Narrative  . Not on file    Allergies  Allergen Reactions  . Accupril [Quinapril Hcl] Cough  . Neosporin [Neomycin-Bacitracin Zn-Polymyx]   . Prednisone     Agitated and shaky  . Reglan [Metoclopramide] Other (See Comments)     tremors  . Tramadol Nausea And Vomiting  . Adhesive [Tape] Rash    Family History  Problem Relation Age of Onset  . Hypertension Mother   . Hyperlipidemia Mother   . Heart disease Sister   . Cancer Brother        prostate    Prior to Admission medications   Medication Sig Start Date End Date Taking? Authorizing Provider  albuterol (PROVENTIL HFA;VENTOLIN HFA) 108 (90 BASE) MCG/ACT inhaler Inhale 2 puffs into the lungs every 6 (six) hours as needed for wheezing or shortness of breath. 12/04/13  Yes Vicie Mutters, PA-C  Ascorbic Acid (VITAMIN C) 1000 MG tablet Take 1,000 mg by mouth daily.    Yes [provider]  aspirin 81 MG tablet Take 81 mg by mouth daily.   Yes [provider]  atenolol (TENORMIN) 100 MG tablet Take 1 tablet every morning for BP & Tremor Patient taking differently: Take 100 mg by mouth daily.  09/16/18  Yes Unk Pinto, MD  b complex vitamins tablet Take 1 tablet by mouth daily.   Yes [provider]  bumetanide (BUMEX) 1 MG tablet TAKE 1/2-1  TABLET BY MOUTH TWICE DAILY AS NEEDED FOR SWELLING Patient taking differently: Take 1 mg by mouth daily.  06/06/18  Yes Unk Pinto, MD  Calcium Carbonate (CALCIUM 600 PO) Take 600 mg by mouth every evening.   Yes [provider]  Cholecalciferol (VITAMIN D3) 2000 UNITS TABS Take 2,000 Units by mouth 2 (two) times daily.    Yes [provider]  fluticasone (FLONASE) 50 MCG/ACT nasal spray Place 2 sprays into both nostrils daily. Patient taking differently: Place 2 sprays into both nostrils as needed.  02/28/18  Yes [name], NP  MELATONIN PO Take 10 mg by mouth at bedtime. Takes 1 at bedtime    Yes [provider]  Misc Natural Products (OSTEO BI-FLEX JOINT SHIELD PO) Take 1 tablet by mouth 2 (two) times daily.    Yes [provider]  ranitidine (ZANTAC) 300 MG tablet TAKE 1 TABLET BY MOUTH TWICE DAILY FOR ACID REFLUX Patient not taking: Reported on  11/21/2018 08/04/18   Unk Pinto, MD  telmisartan (MICARDIS) 40 MG tablet Take 1 tablet every night for BP Patient not taking: Reported on 11/21/2018 09/05/18   Unk Pinto, MD    Physical Exam: Vitals:   11/21/18 1330 11/21/18 1345 11/21/18 1608 11/21/18 1630  BP: 128/85 (!) 135/94 117/80 128/88  Pulse: 97 89 (!) 103 (!) 56  Resp: 18 20  20 (!) 22  Temp:      TempSrc:      SpO2: (!) 89% 98% 94% 94%     General:  Appears calm and comfortable and is NAD Eyes:  PERRL, EOMI, normal lids, iris ENT:  grossly normal hearing, lips & tongue, mmm Neck:  no LAD, masses or thyromegaly; no carotid bruits Cardiovascular:  Irregularly irregular but rate controlled, no m/r/g. No LE edema.  Respiratory:   CTA bilaterally with scant R > L crackles in the base.  Normal respiratory effort on RA. Abdomen:  soft, NT, ND, NABS Back:   normal alignment, no CVAT Skin:  no rash or induration seen on limited exam Musculoskeletal:  grossly normal tone BUE/BLE, good ROM, no bony abnormality Psychiatric:  grossly normal mood and affect, speech fluent and appropriate, AOx3 Neurologic:  CN 2-12 grossly intact, moves all extremities in coordinated fashion, sensation intact    Radiological Exams on Admission: Dg Chest 2 View  Result Date: 11/21/2018 CLINICAL DATA:  Shortness of breath and cough over the last several days. EXAM: CHEST - 2 VIEW COMPARISON:  06/25/2016 FINDINGS: The heart is enlarged. There is aortic atherosclerosis. There is pulmonary venous hypertension with mild interstitial edema. There small pleural effusions. There is mild basilar atelectasis. Calcified granuloma again seen in the right lower lung. Findings are most consistent with congestive heart failure. Minor basilar pneumonia not excluded. No acute bone finding. IMPRESSION: Probable congestive heart failure with interstitial edema, small effusions and basilar atelectasis. Basilar pneumonia not excluded. Electronically Signed   By: Nelson Chimes M.D.   On: 11/21/2018 10:11   Ct Angio Chest Pe W/cm &/or Wo Cm  Result Date: 11/21/2018 CLINICAL DATA:  Shortness of breath.  Positive D-dimer. EXAM: CT ANGIOGRAPHY CHEST WITH CONTRAST TECHNIQUE: Multidetector CT imaging of the chest was performed using the standard protocol during bolus administration of intravenous contrast. Multiplanar CT image reconstructions and MIPs were obtained to evaluate the vascular anatomy. CONTRAST:  81mL ISOVUE-370 IOPAMIDOL (ISOVUE-370) INJECTION 76% COMPARISON:  Chest radiograph 11/21/2018 FINDINGS: Cardiovascular: Satisfactory opacification of the pulmonary arteries to the segmental level. No evidence of pulmonary embolism. Heart size appears to be within normal limits. No significant pericardial fluid. Atherosclerotic calcifications in the thoracic aorta. Mediastinum/Nodes: Small to moderate sized hiatal hernia. Mediastinal tissue appears to be mildly prominent but limited evaluation due to the artifact from the adjacent contrast. No significant hilar lymphadenopathy. Small amount of fluid or edema in the right cardiophrenic fat. Lungs/Pleura: Trachea and mainstem bronchi are patent. Small amount of posterior pleural-based thickening in both lungs, right side greater than left. Difficult to exclude trace amount of pleural fluid. Trachea and mainstem bronchi are patent. Large calcified granuloma in the right lower lobe measures up to 1.0 cm. Scattered areas of septal thickening in the lungs. No large areas of lung consolidation. Mild volume loss in both lower lobes. Upper Abdomen: Low-density structure in the left kidney upper pole probably represents a cyst but incompletely evaluated. Mild fullness in the left adrenal tissue. Musculoskeletal: No suspicious bone findings. Review of the MIP images confirms the above findings. IMPRESSION: 1. Negative for a pulmonary embolism. 2. Scattered areas of septal thickening with trace pleural fluid versus pleural thickening.  Findings could be associated with vascular congestion or mild fluid overload. 3. No large areas of lung consolidation or airspace disease. 4. Old granulomatous disease. 5. Hiatal hernia. 6.  Aortic Atherosclerosis (ICD10-I70.0). Electronically Signed   By: Markus Daft M.D.   On: 11/21/2018 14:38  EKG: Independently reviewed.   0949 - Afib with rate 112; nonspecific ST changes with no evidence of acute ischemia 1016 - Afib with rate 110; nonspecific ST changes with no evidence of acute ischemia   Labs on Admission: I have personally reviewed the available labs and imaging studies at the time of the admission.  Pertinent labs:   Glucose 164 BUN 23/Creatinine 0.91/GFR 59 BNP 391.6 Troponin <0.03 Normal CBC D-dimer 1.42  Assessment/Plan Principal Problem:   Acute on chronic diastolic (congestive) heart failure (HCC) Active Problems:   Essential hypertension   Hyperlipidemia   Prediabetes   CKD (chronic kidney disease) stage 3, GFR 30-59 ml/min (HCC)   Unspecified atrial fibrillation (HCC)   Acute on chronic diastolic CHF -Patient presenting with worsening SOB and DOE -CXR consistent with mild vascular congestion -Normal WBC count -Elevated BNP, none prior to baseline -With elevated BNP and abnl CXR, CHF probable as diagnosis -Will place in observation status with telemetry -Will request echocardiogram  -Continue ASA -Consider initiation of ARB -Continue Atenolol for now - give first dose now since she does not appear to have taken this today and it might be contributing to mild RVR -CHF order set utilized -Was given Lasix 40 mg x 1 in ER and will repeat with 40 mg in AM x 1; no additional Lasix is ordered at this time -Continue [place] O2 for now -Repeat EKG in AM  New-onset afib -Patient presenting with new-onset afib.  -She is generally rate controlled at this time with Atenolol, will continue -Will request Echocardiogram for further evaluation  -Heart rate is generally  well controlled. -CHA2DS2-VASc Score is >2 and so patient will need oral anticoagulation.  -I discussed Coumadin vs. NOAC therapy with the patient, but she became overwhelmed with this conversation and her daughter was not present; I attempted to call her daughter and was unable to reach her. -Will cover with treatment-dose Lovenox for now. -Will request CM consult re: cost analysis, but she appears likely to need Coumadin based on our discussion.  HTN -Takes Atenolol monotherapy at home, will continue for now but consider transition to [place] and also possibly addition of ARB (had cough with ACE)  HLD -Continue Lipitor -Lipids were checked in 08/2018 (TC 207, HDL 54, LDL 133, TG 92) so will not repeat at this time  DM -Last A1c was 6.7 in 10/19; she has transitioned from pre-diabetes to DM; will repeat -Will cover with moderate-scale SSI for now  CKD -Appears to be stable at this time -Recheck BMP in AM   DVT prophylaxis: Treatment-dose Lovenox  Code Status:  Full - confirmed with patient Family Communication: None present; called daughter twice and left a message without success Disposition Plan:  Home once clinically improved Consults called: CM/PT Admission status: It is my clinical opinion that referral for OBSERVATION is reasonable and necessary in this patient based on the above information provided. The aforementioned taken together are felt to place the patient at high risk for further clinical deterioration. However it is anticipated that the patient may be medically stable for discharge from the hospital within 24 to 48 hours.   Karmen Bongo MD [facility]  If note is complete, please contact covering daytime or nighttime physician. [email or web address] Password [email or web address]  11/21/2018, 5:01 PM

## 2018-11-21 NOTE — ED Provider Notes (Signed)
[facility] EMERGENCY DEPARTMENT Provider Note   CSN: [id number] Arrival date & time: 11/21/18  0945     History   Chief Complaint Chief Complaint  Patient presents with  . Shortness of Breath  . Back Pain    HPI Briana [name] is a 85 y.o. female.  HPI  85 year old female presents with left-sided back pain and shortness of breath.  Shortness of breath started on 1/3 and has progressively worsened.  Is constant but worsens when she does something.  Laying on her side seems to make the symptoms worse and she has to sit back up.  No leg swelling or abdominal swelling.  She is had a little bit of a cough but minimal.  No chest pain.  Hard to describe the left-sided back pain but it is pretty much constant.  No unilateral leg swelling or recent travel.  She has chronic dizziness when she first stands up or standing up from bending over but no new dizziness/lightheadedness.  No palpitations.  Past Medical History:  Diagnosis Date  . Cancer ([place])    skin cancer  . DDD (degenerative disc disease)   . Diabetes mellitus without complication ([place])    "pre-diabetes"  . Diverticulitis   . DJD (degenerative joint disease)   . GERD (gastroesophageal reflux disease)    takes ranitidine  . Heart murmur   . History of hiatal hernia   . History of pneumonia   . Hx of irritable bowel syndrome   . Hyperlipidemia   . Occasional tremors   . Osteoporosis   . Pre-diabetes   . Varicose veins   . Vitamin D deficiency     Patient Active Problem List   Diagnosis Date Noted  . CKD (chronic kidney disease) stage 3, GFR 30-59 ml/min (HCC) 02/23/2018  . Obesity (BMI 30.0-34.9) 09/05/2015  . Lumbar stenosis 06/05/2015  . Medication management 06/13/2014  . GERD 11/20/2013  . Osteoporosis 11/20/2013  . Essential hypertension 11/19/2013  . Hyperlipidemia 11/19/2013  . Prediabetes 11/19/2013  . Vitamin D deficiency 11/19/2013  . Diffuse cystic mastopathy 11/19/2013    Past  Surgical History:  Procedure Laterality Date  . APPENDECTOMY    . BREAST SURGERY Right    fluid drained from breast  . COLONOSCOPY    . ESOPHAGOGASTRODUODENOSCOPY    . JOINT REPLACEMENT Left    left hip  . LUMBAR LAMINECTOMY/DECOMPRESSION MICRODISCECTOMY N/A 06/05/2015   Procedure: L3-L5 DECOMPRESSION ;  Surgeon: Melina Schools, MD;  Location: [facility];  Service: Orthopedics;  Laterality: N/A;  . OPEN REDUCTION INTERNAL FIXATION (ORIF) DISTAL RADIAL FRACTURE Right 01/10/2014   Procedure: OPEN REDUCTION INTERNAL FIXATION (ORIF) DISTAL RADIAL FRACTURE;  Surgeon: Linna Hoff, MD;  Location: [place];  Service: Orthopedics;  Laterality: Right;  . SPINAL CORD DECOMPRESSION  06/05/2015   L3 L 5  . THYROID SURGERY    . TONSILLECTOMY    . TUBAL LIGATION    . varicose veins stripped       OB History   No obstetric history on file.      Home Medications    Prior to Admission medications   Medication Sig Start Date End Date Taking? Authorizing Provider  albuterol (PROVENTIL HFA;VENTOLIN HFA) 108 (90 BASE) MCG/ACT inhaler Inhale 2 puffs into the lungs every 6 (six) hours as needed for wheezing or shortness of breath. 12/04/13   Vicie Mutters, PA-C  Ascorbic Acid (VITAMIN C) 1000 MG tablet Take 1,000 mg by mouth daily.  [provider]  aspirin 81 MG tablet Take 81 mg by mouth daily.    [provider]  atenolol (TENORMIN) 100 MG tablet Take 1 tablet every morning for BP & Tremor 09/16/18   Unk Pinto, MD  b complex vitamins tablet Take 1 tablet by mouth daily.    [provider]  bumetanide (BUMEX) 1 MG tablet TAKE 1/2-1  TABLET BY MOUTH TWICE DAILY AS NEEDED FOR SWELLING 06/06/18   Unk Pinto, MD  Calcium Carbonate (CALCIUM 600 PO) Take 600 mg by mouth every evening.    [provider]  Cholecalciferol (VITAMIN D3) 2000 UNITS TABS Take 2,000-4,000 Units by mouth 2 (two) times daily. Take 4000 units in the morning and 2000 units in the evening     [provider]  fluticasone (FLONASE) 50 MCG/ACT nasal spray Place 2 sprays into both nostrils daily. Patient taking differently: Place 2 sprays into both nostrils as needed.  02/28/18   [name], NP  MELATONIN PO Take by mouth. Takes 1 at bedtime    [provider]  Misc Natural Products (OSTEO BI-FLEX JOINT SHIELD PO) Take 1 tablet by mouth 2 (two) times daily.     [provider]  ranitidine (ZANTAC) 300 MG tablet TAKE 1 TABLET BY MOUTH TWICE DAILY FOR ACID REFLUX 08/04/18   Unk Pinto, MD  telmisartan (MICARDIS) 40 MG tablet Take 1 tablet every night for BP 09/05/18   Unk Pinto, MD    Family History Family History  Problem Relation Age of Onset  . Hypertension Mother   . Hyperlipidemia Mother   . Heart disease Sister   . Cancer Brother        prostate    Social History Social History   Tobacco Use  . Smoking status: Never Smoker  . Smokeless tobacco: Never Used  Substance Use Topics  . Alcohol use: No  . Drug use: No     Allergies   Accupril [quinapril hcl]; Neosporin [neomycin-bacitracin zn-polymyx]; Prednisone; Reglan [metoclopramide]; Tramadol; and Adhesive [tape]   Review of Systems Review of Systems  Constitutional: Negative for fever.  Respiratory: Positive for cough and shortness of breath.   Cardiovascular: Negative for chest pain, palpitations and leg swelling.  Gastrointestinal: Negative for abdominal pain and vomiting.  Musculoskeletal: Positive for back pain.  All other systems reviewed and are negative.    Physical Exam Updated Vital Signs BP (!) 133/111   Pulse 89   Temp 98 F (36.7 C) (Oral)   Resp (!) 21   SpO2 94%   Physical Exam Vitals signs and nursing note reviewed.  Constitutional:      General: She is not in acute distress.    Appearance: She is well-developed. She is not ill-appearing or diaphoretic.  HENT:     Head: Normocephalic and atraumatic.     Right Ear: External ear normal.      Left Ear: External ear normal.     Nose: Nose normal.  Eyes:     General:        Right eye: No discharge.        Left eye: No discharge.  Cardiovascular:     Rate and Rhythm: Tachycardia present. Rhythm irregular.     Heart sounds: Normal heart sounds.  Pulmonary:     Effort: Pulmonary effort is normal. Tachypnea present. No accessory muscle usage or respiratory distress.     Breath sounds: Decreased breath sounds (mild, bases) present.  Abdominal:     Palpations: Abdomen is  soft.     Tenderness: There is no abdominal tenderness.  Musculoskeletal:     Thoracic back: She exhibits no tenderness.       Back:     Right lower leg: No edema.     Left lower leg: No edema.     Comments: I am unable to reproduce the pain in her back, but this is where she indicates it's most painful  Skin:    General: Skin is warm and dry.  Neurological:     Mental Status: She is alert.  Psychiatric:        Mood and Affect: Mood is not anxious.      ED Treatments / Results  Labs (all labs ordered are listed, but only abnormal results are displayed) Labs Reviewed  BASIC METABOLIC PANEL  BRAIN NATRIURETIC PEPTIDE  TROPONIN I  CBC WITH DIFFERENTIAL/PLATELET  D-DIMER, QUANTITATIVE (NOT AT [facility])    EKG EKG Interpretation  Date/Time:  Monday November 21 2018 10:16:47 EST Ventricular Rate:  110 PR Interval:    QRS Duration: 98 QT Interval:  354 QTC Calculation: 466 R Axis:   -92 Text Interpretation:  Atrial fibrillation Left anterior fascicular block Probable anteroseptal infarct, old similar to earlier in the day Artifact Confirmed by Sherwood Gambler [phone]) on 11/21/2018 10:29:24 AM   Radiology Dg Chest 2 View  Result Date: 11/21/2018 CLINICAL DATA:  Shortness of breath and cough over the last several days. EXAM: CHEST - 2 VIEW COMPARISON:  06/25/2016 FINDINGS: The heart is enlarged. There is aortic atherosclerosis. There is pulmonary venous hypertension with mild interstitial edema.  There small pleural effusions. There is mild basilar atelectasis. Calcified granuloma again seen in the right lower lung. Findings are most consistent with congestive heart failure. Minor basilar pneumonia not excluded. No acute bone finding. IMPRESSION: Probable congestive heart failure with interstitial edema, small effusions and basilar atelectasis. Basilar pneumonia not excluded. Electronically Signed   By: Nelson Chimes M.D.   On: 11/21/2018 10:11    Procedures Procedures (including critical care time)  Medications Ordered in ED Medications  fentaNYL (SUBLIMAZE) injection 50 mcg (has no administration in time range)  aspirin chewable tablet 324 mg (has no administration in time range)     Initial Impression / Assessment and Plan / ED Course  I have reviewed the triage vital signs and the nursing notes.  Pertinent labs & imaging results that were available during my care of the patient were reviewed by me and considered in my medical decision making (see chart for details).     Patient appears to have new onset A. fib.  At times her rate is in the 120s.  She does not appear severely short of breath but with the chest x-ray findings and CT findings she probably has some mild volume overload.  BNP is over 300.  She will be given IV Lasix as well as given rate control with IV Cardizem.  With the left-sided back pain, I think is reasonable to also rule out for ACS.  She will be admitted to the hospitalist service for further work-up.  Final Clinical Impressions(s) / ED Diagnoses   Final diagnoses:  None    ED Discharge Orders    None       Sherwood Gambler, MD 11/21/18 1547

## 2018-11-21 NOTE — Progress Notes (Signed)
ANTICOAGULATION CONSULT NOTE - Initial Consult  Pharmacy Consult for Lovenox Indication: atrial fibrillation  Allergies  Allergen Reactions  . Accupril [Quinapril Hcl] Cough  . Neosporin [Neomycin-Bacitracin Zn-Polymyx]   . Prednisone     Agitated and shaky  . Reglan [Metoclopramide] Other (See Comments)    tremors  . Tramadol Nausea And Vomiting  . Adhesive [Tape] Rash    Patient Measurements: Height: 5' 1.5" (156.2 cm) Weight: 168 lb (76.2 kg) IBW/kg (Calculated) : 48.95  Vital Signs: Temp: 98 F (36.7 C) (01/06 1001) Temp Source: Oral (01/06 1001) BP: 128/88 (01/06 1630) Pulse Rate: 56 (01/06 1630)  Labs: Recent Labs    11/21/18 1028  HGB 12.8  HCT 41.2  PLT 179  CREATININE 0.91  TROPONINI <0.03    Estimated Creatinine Clearance: 45.1 mL/min (by C-G formula based on SCr of 0.91 mg/dL).   Medical History: Past Medical History:  Diagnosis Date  . Cancer ([place])    skin cancer  . DDD (degenerative disc disease)   . Diverticulitis   . DJD (degenerative joint disease)   . GERD (gastroesophageal reflux disease)    takes ranitidine  . Heart murmur   . History of hiatal hernia   . History of pneumonia   . Hx of irritable bowel syndrome   . Hyperlipidemia   . Occasional tremors   . Osteoporosis   . Pre-diabetes   . Varicose veins   . Vitamin D deficiency     Assessment: 71 YOF who presented to the [facility] on 1/6 with SOB and was found to have new-onset Afib. Pharmacy consulted to start Lovenox for anticoagulation.   SCr 0.91, CrCl~40-50 ml/min. Will use twice daily dosing.   Goal of Therapy:  Anti-Xa level 0.6-1 units/ml 4hrs after LMWH dose given Monitor platelets by anticoagulation protocol: Yes   Plan:  - Start Lovenox 80 mg SQ x 1 now followed by 75 mg SQ every 12 hours - Will watch renal function for any necessary dose adjustments.   Thank you for allowing pharmacy to be a part of this patient's care.  Alycia Rossetti, PharmD, BCPS Clinical  Pharmacist Please check AMION for all [place] numbers 11/21/2018 6:02 PM

## 2018-11-21 NOTE — Telephone Encounter (Signed)
Patient called and reported she is having difficulty breathing sine 11/18/2018 and left side pain.  Per Dr Melford Aase, the patient was advised to go to the ER to be evaluated.  Patient agreed.

## 2018-11-21 NOTE — ED Notes (Signed)
Called pharmacy to verify and send medications

## 2018-11-21 NOTE — ED Notes (Signed)
Dinner tray arrived 

## 2018-11-21 NOTE — ED Notes (Signed)
Patient transported to X-ray 

## 2018-11-21 NOTE — ED Notes (Addendum)
Pts dinner tray ordered.  

## 2018-11-21 NOTE — ED Notes (Signed)
Dinner Tray Ordered @ 1720-per RN-called by Levada Dy

## 2018-11-22 ENCOUNTER — Ambulatory Visit (HOSPITAL_BASED_OUTPATIENT_CLINIC_OR_DEPARTMENT_OTHER): Payer: Medicare Other

## 2018-11-22 ENCOUNTER — Other Ambulatory Visit: Payer: Self-pay

## 2018-11-22 DIAGNOSIS — I130 Hypertensive heart and chronic kidney disease with heart failure and stage 1 through stage 4 chronic kidney disease, or unspecified chronic kidney disease: Secondary | ICD-10-CM | POA: Diagnosis not present

## 2018-11-22 DIAGNOSIS — E1122 Type 2 diabetes mellitus with diabetic chronic kidney disease: Secondary | ICD-10-CM | POA: Diagnosis not present

## 2018-11-22 DIAGNOSIS — I5033 Acute on chronic diastolic (congestive) heart failure: Secondary | ICD-10-CM

## 2018-11-22 DIAGNOSIS — I4891 Unspecified atrial fibrillation: Secondary | ICD-10-CM | POA: Diagnosis not present

## 2018-11-22 DIAGNOSIS — I10 Essential (primary) hypertension: Secondary | ICD-10-CM

## 2018-11-22 DIAGNOSIS — N183 Chronic kidney disease, stage 3 (moderate): Secondary | ICD-10-CM

## 2018-11-22 DIAGNOSIS — E785 Hyperlipidemia, unspecified: Secondary | ICD-10-CM | POA: Diagnosis not present

## 2018-11-22 LAB — BASIC METABOLIC PANEL
Anion gap: 9 (ref 5–15)
BUN: 15 mg/dL (ref 8–23)
CO2: 26 mmol/L (ref 22–32)
Calcium: 9.3 mg/dL (ref 8.9–10.3)
Chloride: 106 mmol/L (ref 98–111)
Creatinine, Ser: 0.85 mg/dL (ref 0.44–1.00)
GFR calc Af Amer: >60 mL/min (ref >60)
GFR calc non Af Amer: >60 mL/min (ref >60)
Glucose, Bld: 131 mg/dL — ABNORMAL HIGH (ref 70–99)
Potassium: 3.5 mmol/L (ref 3.5–5.1)
Sodium: 141 mmol/L (ref 135–145)

## 2018-11-22 LAB — GLUCOSE, CAPILLARY
Glucose-Capillary: 144 mg/dL — ABNORMAL HIGH (ref 70–99)
Glucose-Capillary: 126 mg/dL — ABNORMAL HIGH (ref 70–99)
Glucose-Capillary: 112 mg/dL — ABNORMAL HIGH (ref 70–99)
Glucose-Capillary: 160 mg/dL — ABNORMAL HIGH (ref 70–99)

## 2018-11-22 LAB — TSH: TSH: 0.95 u[IU]/mL (ref 0.350–4.500)

## 2018-11-22 LAB — ECHOCARDIOGRAM COMPLETE
Height: 62 in
Weight: 2678.4 oz

## 2018-11-22 MED ORDER — FUROSEMIDE 10 MG/ML IJ SOLN
40.0000 mg | Freq: Every day | INTRAMUSCULAR | Status: DC
  Administered 2018-11-23: 40 mg via INTRAVENOUS
  Filled 2018-11-22: qty 4

## 2018-11-22 NOTE — Care Management Note (Signed)
Case Management Note  Patient Details  Name: Briana [name] MRN: [id number] Date of Birth: 11/05/1934  Subjective/Objective:  85 yo female presented with SOB.                 Action/Plan: CM met with patient to discuss transitional needs. Patient states living at home alone, independent with ADLs with no AD in use. PCP verified as: Dr. Melford Aase; pharmacy of choice: [facility]. Benefits check complete for Eliquis/Xarelto with est monthly cost $47; CM discussed, with patient verbalizing understanding. Patient would like to utilize [facility] for her Rx at discharge. Awaiting MD to determine DOAC. CM team will continue to follow.   Expected Discharge Date:                  Expected Discharge Plan:  Home/Self Care  In-House Referral:  NA  Discharge planning Services  CM Consult, Medication Assistance(Eliquis/Xarelto benefits check)  Post Acute Care Choice:  NA Choice offered to:  NA  DME Arranged:  N/A DME Agency:  NA  HH Arranged:  NA HH Agency:  NA  Status of Service:  In process, will continue to follow  If discussed at Long Length of Stay Meetings, dates discussed:    Additional Comments:  Midge Minium RN, BSN, NCM-BC, ACM-RN [phone] 11/22/2018, 3:53 PM

## 2018-11-22 NOTE — Progress Notes (Signed)
PROGRESS NOTE    Briana [name]  KVQ:[id number] DOB: 09/11/1934 DOA: 11/21/2018 PCP: Unk Pinto, MD    Brief Narrative: Briana [name] is a 85 y.o. female with medical history significant of pre-DM; HLD; and diverticulitis presenting with SOB since last Friday. She was found to have CHF and new onset a fib.   Assessment & Plan:   Principal Problem:   Acute on chronic diastolic (congestive) heart failure (HCC) Active Problems:   Essential hypertension   Hyperlipidemia   Prediabetes   CKD (chronic kidney disease) stage 3, GFR 30-59 ml/min (HCC)   Unspecified atrial fibrillation (HCC)  Acute on chronic diastolic chf: Improving, diuresed about 2 lit since admission.  Echocardiogram ordered showed Left ventricle cavity size was normal. Wall thickness was   increased in a pattern of mild LVH. Systolic function was normal. The estimated ejection fraction was in the range of 60% to 65%.  Wall motion was normal; there were no regional wall motion   abnormalities. The study was not technically sufficient to allow  evaluation of LV diastolic dysfunction due to atrial  fibrillation. She was started on IV lasix , received two doses so far. Recommend to continue with IV LASIX 40 mg daily.  Continue with strict intake and output.  Fluid restriction to 1.5 lit/day.  Cardiology to see the patient in am.     New onset atrial fibrillation; Rate controlled with atenolol.  Echocardiogram reviewed.  On lovenox for anticoagulation.  CM consulted for NOAC CO pay.    Hypertension:  Well controlled.    hyperlipidemia  Resume lipitos.    Diabetes mellitus>  CBG (last 3)  Recent Labs    11/22/18 0759 11/22/18 1124 11/22/18 1742  GLUCAP 144* 126* 112*  A1c is 6.2  Resume SSI while in the hospital.    Stage 3CKD: Creatinine appears to be at baseline at this time.    DVT prophylaxis: lovenox.  Code Status: (full code.  Family Communication: discussed with daughter over the phone.   Disposition Plan: pending clinical improvement.   Consultants:   Cardiology to see the patient in am.   Procedures:Echocardiogram.    Antimicrobials:None.   Subjective: Anxious, but reports her breathing has improved.   Objective: Vitals:   11/22/18 0035 11/22/18 0554 11/22/18 1127 11/22/18 1146  BP:  (!) 144/85 128/72   Pulse:  98 (!) 117 100  Resp:  18 18   Temp:  97.8 F (36.6 C) 98.6 F (37 C)   TempSrc:  Oral Oral   SpO2:  95% 95%   Weight: 75.9 kg     Height:        Intake/Output Summary (Last 24 hours) at 11/22/2018 1227 Last data filed at 11/22/2018 1013 Gross per 24 hour  Intake 480 ml  Output 2500 ml  Net -2020 ml   Filed Weights   11/21/18 1757 11/21/18 2030 11/22/18 0035  Weight: 76.2 kg 76.2 kg 75.9 kg    Examination:  General exam: Appears calm and comfortable  Respiratory system: rales at bases, air entry fair.  Cardiovascular system: S1 & S2 heard, irregular.  Gastrointestinal system: Abdomen is nondistended, soft and nontender. No organomegaly or masses felt. Normal bowel sounds heard. Central nervous system: Alert and oriented. No focal neurological deficits. Extremities: Symmetric 5 x 5 power. Skin: No rashes, lesions or ulcers Psychiatry:Mood & affect appropriate.     Data Reviewed: I have personally reviewed following labs and imaging studies  CBC: Recent Labs  Lab 11/21/18 1028 11/21/18  2014  WBC 5.1 6.1  NEUTROABS 3.4 3.6  HGB 12.8 13.1  HCT 41.2 40.8  MCV 93.6 93.6  PLT 179 323   Basic Metabolic Panel: Recent Labs  Lab 11/21/18 1028 11/22/18 0513  NA 139 141  K 4.0 3.5  CL 107 106  CO2 23 26  GLUCOSE 164* 131*  BUN 16 15  CREATININE 0.91 0.85  CALCIUM 9.6 9.3   GFR: Estimated Creatinine Clearance: 48.7 mL/min (by C-G formula based on SCr of 0.85 mg/dL). Liver Function Tests: No results for input(s): AST, ALT, ALKPHOS, BILITOT, PROT, ALBUMIN in the last 168 hours. No results for input(s): LIPASE, AMYLASE in the  last 168 hours. No results for input(s): AMMONIA in the last 168 hours. Coagulation Profile: No results for input(s): INR, PROTIME in the last 168 hours. Cardiac Enzymes: Recent Labs  Lab 11/21/18 1028  TROPONINI <0.03   BNP (last 3 results) No results for input(s): PROBNP in the last 8760 hours. HbA1C: Recent Labs    11/21/18 2014  HGBA1C 6.2*   CBG: Recent Labs  Lab 11/21/18 2208 11/22/18 0759 11/22/18 1124  GLUCAP 95 144* 126*   Lipid Profile: No results for input(s): CHOL, HDL, LDLCALC, TRIG, CHOLHDL, LDLDIRECT in the last 72 hours. Thyroid Function Tests: No results for input(s): TSH, T4TOTAL, FREET4, T3FREE, THYROIDAB in the last 72 hours. Anemia Panel: No results for input(s): VITAMINB12, FOLATE, FERRITIN, TIBC, IRON, RETICCTPCT in the last 72 hours. Sepsis Labs: No results for input(s): PROCALCITON, LATICACIDVEN in the last 168 hours.  No results found for this or any previous visit (from the past 240 hour(s)).       Radiology Studies: Dg Chest 2 View  Result Date: 11/21/2018 CLINICAL DATA:  Shortness of breath and cough over the last several days. EXAM: CHEST - 2 VIEW COMPARISON:  06/25/2016 FINDINGS: The heart is enlarged. There is aortic atherosclerosis. There is pulmonary venous hypertension with mild interstitial edema. There small pleural effusions. There is mild basilar atelectasis. Calcified granuloma again seen in the right lower lung. Findings are most consistent with congestive heart failure. Minor basilar pneumonia not excluded. No acute bone finding. IMPRESSION: Probable congestive heart failure with interstitial edema, small effusions and basilar atelectasis. Basilar pneumonia not excluded. Electronically Signed   By: Nelson Chimes M.D.   On: 11/21/2018 10:11   Ct Angio Chest Pe W/cm &/or Wo Cm  Result Date: 11/21/2018 CLINICAL DATA:  Shortness of breath.  Positive D-dimer. EXAM: CT ANGIOGRAPHY CHEST WITH CONTRAST TECHNIQUE: Multidetector CT imaging  of the chest was performed using the standard protocol during bolus administration of intravenous contrast. Multiplanar CT image reconstructions and MIPs were obtained to evaluate the vascular anatomy. CONTRAST:  24mL ISOVUE-370 IOPAMIDOL (ISOVUE-370) INJECTION 76% COMPARISON:  Chest radiograph 11/21/2018 FINDINGS: Cardiovascular: Satisfactory opacification of the pulmonary arteries to the segmental level. No evidence of pulmonary embolism. Heart size appears to be within normal limits. No significant pericardial fluid. Atherosclerotic calcifications in the thoracic aorta. Mediastinum/Nodes: Small to moderate sized hiatal hernia. Mediastinal tissue appears to be mildly prominent but limited evaluation due to the artifact from the adjacent contrast. No significant hilar lymphadenopathy. Small amount of fluid or edema in the right cardiophrenic fat. Lungs/Pleura: Trachea and mainstem bronchi are patent. Small amount of posterior pleural-based thickening in both lungs, right side greater than left. Difficult to exclude trace amount of pleural fluid. Trachea and mainstem bronchi are patent. Large calcified granuloma in the right lower lobe measures up to 1.0 cm. Scattered areas of septal  thickening in the lungs. No large areas of lung consolidation. Mild volume loss in both lower lobes. Upper Abdomen: Low-density structure in the left kidney upper pole probably represents a cyst but incompletely evaluated. Mild fullness in the left adrenal tissue. Musculoskeletal: No suspicious bone findings. Review of the MIP images confirms the above findings. IMPRESSION: 1. Negative for a pulmonary embolism. 2. Scattered areas of septal thickening with trace pleural fluid versus pleural thickening. Findings could be associated with vascular congestion or mild fluid overload. 3. No large areas of lung consolidation or airspace disease. 4. Old granulomatous disease. 5. Hiatal hernia. 6.  Aortic Atherosclerosis (ICD10-I70.0).  Electronically Signed   By: Markus Daft M.D.   On: 11/21/2018 14:38        Scheduled Meds: . aspirin EC  81 mg Oral Daily  . atenolol  100 mg Oral Daily  . bumetanide  1 mg Oral Daily  . enoxaparin (LOVENOX) injection  75 mg Subcutaneous Q12H  . [START ON 11/23/2018] furosemide  40 mg Intravenous Daily  . Melatonin  9 mg Oral QHS   Continuous Infusions:   LOS: 0 days    Time spent: 64  Minutes     Hosie Poisson, MD [facility] Pager [phone]  If 7PM-7AM, please contact night-coverage [email or web address] Password [facility] 11/22/2018, 12:27 PM

## 2018-11-22 NOTE — Plan of Care (Signed)
  Problem: Activity: Goal: Risk for activity intolerance will decrease 11/22/2018 2344 by Barton Dubois, RN Outcome: Progressing 11/22/2018 2341 by Barton Dubois, RN Outcome: Progressing   Problem: Elimination: Goal: Will not experience complications related to bowel motility Outcome: Progressing   Problem: Pain Managment: Goal: General experience of comfort will improve Outcome: Progressing   Problem: Safety: Goal: Ability to remain free from injury will improve 11/22/2018 2344 by Barton Dubois, RN Outcome: Progressing 11/22/2018 2341 by Barton Dubois, RN Outcome: Progressing

## 2018-11-22 NOTE — Evaluation (Signed)
Physical Therapy Evaluation Patient Details Name: Briana [name] MRN: [id number] DOB: Feb 02, 1934 Today's Date: 11/22/2018   History of Present Illness  Patient is a 85 y/o female who presents with SOB and left side pain. Admitted with new onset CHF and new onset A-fib. CXR-mild vascular congestion. PMH includes DM, back surgery, HLD.  Clinical Impression  Patient presents with dyspnea on exertion, decreased endurance, decreased activity tolerance and impaired balance s/p above. Tolerated gait training with Min guard assist for balance/safety. Pt fatigues quickly and requires seated rest break. HR ranged from 120-150s bpm in A-fib. May benefit from using RW for short term for stability. Education re: energy conservation techniques, exercise program to gradually increase activity. Encouraged walking with nursing daily. Pt lives alone and independent PTA, drives and walks 1 mile 3 days/week. Will follow acutely to maximize independence and mobility prior to return home.     Follow Up Recommendations No PT follow up;Supervision - Intermittent    Equipment Recommendations  None recommended by PT    Recommendations for Other Services       Precautions / Restrictions Precautions Precautions: Fall;Other (comment) Precaution Comments: watch HR Restrictions Weight Bearing Restrictions: No      Mobility  Bed Mobility Overal bed mobility: Modified Independent             General bed mobility comments: No assist needed.  Transfers Overall transfer level: Needs assistance Equipment used: None Transfers: Sit to/from Stand Sit to Stand: Supervision         General transfer comment: Supervision for safety. Stood from [employer], from chair x1.   Ambulation/Gait Ambulation/Gait assistance: Min guard Gait Distance (Feet): 120 Feet Assistive device: (rail in hallway) Gait Pattern/deviations: Step-through pattern;Decreased stride length;Drifts right/left Gait velocity: decreased    General Gait Details: Slow, mildly unsteady gait holding onto rail for support at times. Fatigues. HR ranged from 120-150s bpm. 1 seated rest break.   Stairs            Wheelchair Mobility    Modified Rankin (Stroke Patients Only)       Balance Overall balance assessment: Needs assistance Sitting-balance support: Feet supported;No upper extremity supported Sitting balance-Leahy Scale: Good     Standing balance support: During functional activity Standing balance-Leahy Scale: Fair                               Pertinent Vitals/Pain Pain Assessment: No/denies pain    Home Living Family/patient expects to be discharged to:: Private residence Living Arrangements: Alone Available Help at Discharge: Family;Available PRN/intermittently Type of Home: House Home Access: Stairs to enter Entrance Stairs-Rails: Right Entrance Stairs-Number of Steps: 2 Home Layout: One level Home Equipment: Walker - 2 wheels;Cane - single point      Prior Function Level of Independence: Independent         Comments: Independent for ADLs and IADLs. Has SPC for community ambulation to use as needed. Walks 1 mile 3 days/week.      Hand Dominance   Dominant Hand: Right    Extremity/Trunk Assessment   Upper Extremity Assessment Upper Extremity Assessment: Defer to OT evaluation    Lower Extremity Assessment Lower Extremity Assessment: Overall WFL for tasks assessed    Cervical / Trunk Assessment Cervical / Trunk Assessment: Kyphotic  Communication   Communication: No difficulties  Cognition Arousal/Alertness: Awake/alert Behavior During Therapy: WFL for tasks assessed/performed Overall Cognitive Status: Within Functional Limits for tasks assessed  General Comments      Exercises     Assessment/Plan    PT Assessment Patient needs continued PT services  PT Problem List Decreased strength;Decreased  mobility;Decreased balance;Decreased activity tolerance;Cardiopulmonary status limiting activity       PT Treatment Interventions Functional mobility training;Balance training;Patient/family education;Gait training;Therapeutic activities;Therapeutic exercise;Stair training;DME instruction    PT Goals (Current goals can be found in the Care Plan section)  Acute Rehab PT Goals Patient Stated Goal: to get back to walking PT Goal Formulation: With patient Time For Goal Achievement: 12/06/18 Potential to Achieve Goals: Good    Frequency Min 3X/week   Barriers to discharge Decreased caregiver support lives alone    Co-evaluation               AM-PAC PT "6 Clicks" Mobility  Outcome Measure Help needed turning from your back to your side while in a flat bed without using bedrails?: None Help needed moving from lying on your back to sitting on the side of a flat bed without using bedrails?: None Help needed moving to and from a bed to a chair (including a wheelchair)?: None Help needed standing up from a chair using your arms (e.g., wheelchair or bedside chair)?: None Help needed to walk in hospital room?: A Little Help needed climbing 3-5 steps with a railing? : A Little 6 Click Score: 22    End of Session Equipment Utilized During Treatment: Gait belt Activity Tolerance: Treatment limited secondary to medical complications (Comment)(tachycardia in A-fib) Patient left: in bed;with call bell/phone within reach Nurse Communication: Mobility status PT Visit Diagnosis: Muscle weakness (generalized) (M62.81);Unsteadiness on feet (R26.81)    Time: 4680-3212 PT Time Calculation (min) (ACUTE ONLY): 19 min   Charges:   PT Evaluation $PT Eval Moderate Complexity: 1 Mod          Wray Kearns, PT, DPT Acute Rehabilitation Services Pager [phone] Office [phone]      [place] 11/22/2018, 8:58 AM

## 2018-11-22 NOTE — Progress Notes (Signed)
  Echocardiogram 2D Echocardiogram has been performed.  Briana [name] 11/22/2018, 1:58 PM

## 2018-11-22 NOTE — Plan of Care (Signed)
  Problem: Clinical Measurements: Goal: Will remain free from infection Outcome: Progressing   Problem: Activity: Goal: Risk for activity intolerance will decrease Outcome: Progressing   Problem: Safety: Goal: Ability to remain free from injury will improve Outcome: Progressing   

## 2018-11-22 NOTE — Progress Notes (Signed)
Attempted echo.  Patient just received breakfast and wanted to eat food before it got cold.  Will attempt at a later time.

## 2018-11-22 NOTE — Care Management Note (Signed)
Case Management Note  Patient Details  Name: Briana [name] MRN: [id number] Date of Birth: Aug 18, 1934  Subjective/Objective:                    Action/Plan:   Expected Discharge Date:                  Expected Discharge Plan:     In-House Referral:     Discharge planning Services     Post Acute Care Choice:    Choice offered to:     DME Arranged:    DME Agency:     HH Arranged:    HH Agency:     Status of Service:     If discussed at [employer] of Stay Meetings, dates discussed:    Additional Comments: Per [facility] [phone] Eliquis and Xarelto are tier 3 for all 4 doses requested the co pay is $47.00 and 90 day mail order is $131.00 for both drugs generic not on formulary no prior auth required   Kerin Salen 11/22/2018, 1:15 PM

## 2018-11-22 NOTE — Care Management Obs Status (Signed)
[facility] NOTIFICATION   Patient Details  Name: Briana [name] MRN: [id number] Date of Birth: 01-28-1934   Medicare Observation Status Notification Given:  Yes    Midge Minium RN, BSN, NCM-BC, ACM-RN [phone] 11/22/2018, 3:52 PM

## 2018-11-22 NOTE — Plan of Care (Signed)
  Problem: Activity: Goal: Risk for activity intolerance will decrease Outcome: Progressing   Problem: Elimination: Goal: Will not experience complications related to bowel motility Outcome: Progressing   Problem: Safety: Goal: Ability to remain free from injury will improve Outcome: Progressing   

## 2018-11-22 NOTE — Care Management (Signed)
Benefits check sent and pending for Eliquis and Xarelto.   Midge Minium RN, BSN, NCM-BC, ACM-RN [phone]

## 2018-11-23 DIAGNOSIS — I5033 Acute on chronic diastolic (congestive) heart failure: Secondary | ICD-10-CM | POA: Diagnosis not present

## 2018-11-23 DIAGNOSIS — I480 Paroxysmal atrial fibrillation: Secondary | ICD-10-CM

## 2018-11-23 LAB — BASIC METABOLIC PANEL
Anion gap: 8 (ref 5–15)
BUN: 17 mg/dL (ref 8–23)
CO2: 27 mmol/L (ref 22–32)
Calcium: 9.5 mg/dL (ref 8.9–10.3)
Chloride: 103 mmol/L (ref 98–111)
Creatinine, Ser: 1 mg/dL (ref 0.44–1.00)
GFR calc Af Amer: >60 mL/min (ref >60)
GFR calc non Af Amer: 52 mL/min — ABNORMAL LOW (ref >60)
Glucose, Bld: 135 mg/dL — ABNORMAL HIGH (ref 70–99)
Potassium: 3.5 mmol/L (ref 3.5–5.1)
Sodium: 138 mmol/L (ref 135–145)

## 2018-11-23 LAB — CBC
HCT: 41.8 % (ref 36.0–46.0)
Hemoglobin: 13.3 g/dL (ref 12.0–15.0)
MCH: 29.2 pg (ref 26.0–34.0)
MCHC: 31.8 g/dL (ref 30.0–36.0)
MCV: 91.7 fL (ref 80.0–100.0)
Platelets: 175 10*3/uL (ref 150–400)
RBC: 4.56 MIL/uL (ref 3.87–5.11)
RDW: 14.6 % (ref 11.5–15.5)
WBC: 4.8 10*3/uL (ref 4.0–10.5)
nRBC: 0 % (ref 0.0–0.2)

## 2018-11-23 LAB — GLUCOSE, CAPILLARY: Glucose-Capillary: 123 mg/dL — ABNORMAL HIGH (ref 70–99)

## 2018-11-23 NOTE — Plan of Care (Signed)

## 2018-11-23 NOTE — Progress Notes (Signed)
PROGRESS NOTE    Briana [name]  ZOX:[id number] DOB: [birth year] DOA: 11/21/2018 PCP: Unk Pinto, MD   Brief Narrative:  85 year old with past medical history of prediabetes, hyperlipidemia, diverticulitis came to the hospital complains of shortness of breath she was found to be in CHF exacerbation and new onset atrial fibrillation.   Assessment & Plan:   Principal Problem:   Acute on chronic diastolic (congestive) heart failure (HCC) Active Problems:   Essential hypertension   Hyperlipidemia   Prediabetes   CKD (chronic kidney disease) stage 3, GFR 30-59 ml/min (HCC)   Unspecified atrial fibrillation (HCC)  Acute respiratory distress, improved Acute on chronic diastolic congestive heart failure, class III, ejection fraction 60 to 65% -Echocardiogram showed ejection fraction 60 to 65%, mild LVH. - Stop IV Lasix, continue oral Bumex 1 mg daily -Cardiology consulted  New onset atrial fibrillation, rate controlled -Currently on Lovenox 1 mg/kg every 12 hours.  Should transition to Eliquis - Cardiology to see the patient today. -On atenolol 100 mg daily  Essential hypertension -Continue atenolol 100 mg daily  CKD stage III -Renal function stable  DVT prophylaxis: Lovenox Code Status: Full code Family Communication: Left voicemail, daughter did not answer. Disposition Plan: To be determined, pending cardiac evaluation  Consultants:   Cardiology  Procedures:   None  Antimicrobials:   None   Subjective: Patient denies any complaints.  Off and on states she might feel palpitation.  Review of Systems Otherwise negative except as per HPI, including: General: Denies fever, chills, night sweats or unintended weight loss. Resp: Denies cough, wheezing, shortness of breath. Cardiac: Denies chest pain, palpitations, orthopnea, paroxysmal nocturnal dyspnea. GI: Denies abdominal pain, nausea, vomiting, diarrhea or constipation GU: Denies dysuria, frequency, hesitancy  or incontinence MS: Denies muscle aches, joint pain or swelling Neuro: Denies headache, neurologic deficits (focal weakness, numbness, tingling), abnormal gait Psych: Denies anxiety, depression, SI/HI/AVH Skin: Denies new rashes or lesions ID: Denies sick contacts, exotic exposures, travel  Objective: Vitals:   11/22/18 2011 11/23/18 0030 11/23/18 0502 11/23/18 1227  BP: 129/88 110/71 121/77 102/72  Pulse: 86 91 93 (!) 57  Resp: 18 18 18 16   Temp: 98.5 F (36.9 C) 97.8 F (36.6 C) 97.8 F (36.6 C) 98.2 F (36.8 C)  TempSrc: Oral Oral Oral Oral  SpO2: 95% 93% 94% 95%  Weight:  75.3 kg    Height:        Intake/Output Summary (Last 24 hours) at 11/23/2018 1327 Last data filed at 11/23/2018 1229 Gross per 24 hour  Intake 840 ml  Output 1400 ml  Net -560 ml   Filed Weights   11/21/18 2030 11/22/18 0035 11/23/18 0030  Weight: 76.2 kg 75.9 kg 75.3 kg    Examination:  General exam: Appears calm and comfortable  Respiratory system: Clear to auscultation. Respiratory effort normal. Cardiovascular system: S1 & S2 heard, RRR. No JVD, murmurs, rubs, gallops or clicks. No pedal edema. Gastrointestinal system: Abdomen is nondistended, soft and nontender. No organomegaly or masses felt. Normal bowel sounds heard. Central nervous system: Alert and oriented. No focal neurological deficits. Extremities: Symmetric 4+ x 5 power. Skin: No rashes, lesions or ulcers Psychiatry: Judgement and insight appear normal. Mood & affect appropriate.     Data Reviewed:   CBC: Recent Labs  Lab 11/21/18 1028 11/21/18 2014 11/23/18 0810  WBC 5.1 6.1 4.8  NEUTROABS 3.4 3.6  --   HGB 12.8 13.1 13.3  HCT 41.2 40.8 41.8  MCV 93.6 93.6 91.7  PLT 179 201  585   Basic Metabolic Panel: Recent Labs  Lab 11/21/18 1028 11/22/18 0513 11/23/18 0810  NA 139 141 138  K 4.0 3.5 3.5  CL 107 106 103  CO2 23 26 27   GLUCOSE 164* 131* 135*  BUN 16 15 17   CREATININE 0.91 0.85 1.00  CALCIUM 9.6 9.3 9.5    GFR: Estimated Creatinine Clearance: 41.2 mL/min (by C-G formula based on SCr of 1 mg/dL). Liver Function Tests: No results for input(s): AST, ALT, ALKPHOS, BILITOT, PROT, ALBUMIN in the last 168 hours. No results for input(s): LIPASE, AMYLASE in the last 168 hours. No results for input(s): AMMONIA in the last 168 hours. Coagulation Profile: No results for input(s): INR, PROTIME in the last 168 hours. Cardiac Enzymes: Recent Labs  Lab 11/21/18 1028  TROPONINI <0.03   BNP (last 3 results) No results for input(s): PROBNP in the last 8760 hours. HbA1C: Recent Labs    11/21/18 2014  HGBA1C 6.2*   CBG: Recent Labs  Lab 11/22/18 0759 11/22/18 1124 11/22/18 1742 11/22/18 2057 11/23/18 0732  GLUCAP 144* 126* 112* 160* 123*   Lipid Profile: No results for input(s): CHOL, HDL, LDLCALC, TRIG, CHOLHDL, LDLDIRECT in the last 72 hours. Thyroid Function Tests: Recent Labs    11/22/18 2004  TSH 0.950   Anemia Panel: No results for input(s): VITAMINB12, FOLATE, FERRITIN, TIBC, IRON, RETICCTPCT in the last 72 hours. Sepsis Labs: No results for input(s): PROCALCITON, LATICACIDVEN in the last 168 hours.  No results found for this or any previous visit (from the past 240 hour(s)).       Radiology Studies: Ct Angio Chest Pe W/cm &/or Wo Cm  Result Date: 11/21/2018 CLINICAL DATA:  Shortness of breath.  Positive D-dimer. EXAM: CT ANGIOGRAPHY CHEST WITH CONTRAST TECHNIQUE: Multidetector CT imaging of the chest was performed using the standard protocol during bolus administration of intravenous contrast. Multiplanar CT image reconstructions and MIPs were obtained to evaluate the vascular anatomy. CONTRAST:  63mL ISOVUE-370 IOPAMIDOL (ISOVUE-370) INJECTION 76% COMPARISON:  Chest radiograph 11/21/2018 FINDINGS: Cardiovascular: Satisfactory opacification of the pulmonary arteries to the segmental level. No evidence of pulmonary embolism. Heart size appears to be within normal limits. No  significant pericardial fluid. Atherosclerotic calcifications in the thoracic aorta. Mediastinum/Nodes: Small to moderate sized hiatal hernia. Mediastinal tissue appears to be mildly prominent but limited evaluation due to the artifact from the adjacent contrast. No significant hilar lymphadenopathy. Small amount of fluid or edema in the right cardiophrenic fat. Lungs/Pleura: Trachea and mainstem bronchi are patent. Small amount of posterior pleural-based thickening in both lungs, right side greater than left. Difficult to exclude trace amount of pleural fluid. Trachea and mainstem bronchi are patent. Large calcified granuloma in the right lower lobe measures up to 1.0 cm. Scattered areas of septal thickening in the lungs. No large areas of lung consolidation. Mild volume loss in both lower lobes. Upper Abdomen: Low-density structure in the left kidney upper pole probably represents a cyst but incompletely evaluated. Mild fullness in the left adrenal tissue. Musculoskeletal: No suspicious bone findings. Review of the MIP images confirms the above findings. IMPRESSION: 1. Negative for a pulmonary embolism. 2. Scattered areas of septal thickening with trace pleural fluid versus pleural thickening. Findings could be associated with vascular congestion or mild fluid overload. 3. No large areas of lung consolidation or airspace disease. 4. Old granulomatous disease. 5. Hiatal hernia. 6.  Aortic Atherosclerosis (ICD10-I70.0). Electronically Signed   By: Markus Daft M.D.   On: 11/21/2018 14:38  Scheduled Meds: . aspirin EC  81 mg Oral Daily  . atenolol  100 mg Oral Daily  . bumetanide  1 mg Oral Daily  . enoxaparin (LOVENOX) injection  75 mg Subcutaneous Q12H  . furosemide  40 mg Intravenous Daily  . Melatonin  9 mg Oral QHS   Continuous Infusions:   LOS: 0 days   Time spent= 25 mins     Arsenio Loader, MD [facility]  If 7PM-7AM, please contact  night-coverage [email or web address] 11/23/2018, 1:27 PM

## 2018-11-23 NOTE — Plan of Care (Signed)
  Problem: Health Behavior/Discharge Planning: Goal: Ability to manage health-related needs will improve Outcome: Progressing   Problem: Clinical Measurements: Goal: Ability to maintain clinical measurements within normal limits will improve Outcome: Progressing Goal: Cardiovascular complication will be avoided Outcome: Progressing   Problem: Coping: Goal: Level of anxiety will decrease Outcome: Progressing

## 2018-11-23 NOTE — Consult Note (Addendum)
Cardiology Consultation:   Patient ID: Briana [name] MRN: [id number]; DOB: 1934-09-27  Admit date: 11/21/2018 Date of Consult: 11/23/2018  Primary Care Provider: Unk Pinto, Briana [name] Primary Cardiologist: Shelva Majestic, Briana [name]  Primary Electrophysiologist:  None    Patient Profile:   Briana [name] is a 85 y.o. female with a hx HTN, HLD, type II diabetes, diverticulosis, varicose veins, GERD and hiatal hernia who is being seen today for new onset atrial fibrillation and acute HFpEF, at the request of Briana [name], Internal Medicine.    History of Present Illness:   Briana [name] has a PMH notable for HTN, HLD, type II diabetes, varicose veins, diverticulosis, GERD and hiatal hernia.   In 2018, she was referred to [name] by her PCP for cardiac evaluation given SOB w/ exertion, fatigue and atypical chest pain. Subsequently, she underwent a nuclear stress test on 08/18/2017.  This was entirely normal and showed an EF of 70%, no ST segment changes and had normal myocardial perfusion without scar or ischemia.  A 2-D echo Doppler study on 08/20/2017 showed an EF of 60-65%.  There was grade 2 diastolic dysfunction.  She had mild aortic insufficiency, mild MR, trivial TR and incidentally a trivial pericardial effusion was identified. Her symptoms at that time were felt to be noncardiac and her chest pain c/w musculoskeletal etiology. Pt was reassured and instructed to f/u PRN. She has not been seen since 2018. She follows reguarlly w/ her PCP, Briana [name].   She presented to the [name] on 11/21/18 with complaints of SOB and left posterior chest discomfort. No anterior CP. In the ED, she was found to be in new onset atrial fibrillation and in acute CHF. V-rates were in the 110s. CXR showed interstitial edema, small effusions and basilar atelectasis. Chest CT negative for PE. BNP elevated at 391. POC troponin negative. TSH WNL. K 4.0 on admit. SCr 0.85 on admit. H/H WNL. Pt was admitted by IM to telemetry. Her home  atenolol was continued for rate control for her afib. She was started on Lovenox with plans to transition to an oral anticoagulant. IV Lasix was given for CHF. Echo was obtained and showed normal LVEF at 60-65% with mild LVH. No significant valvular abnormalities. The LA is mildly dilated.   Pt diuresed 2.5L and was transitioned back to her home PO diuretic. She is feeling better. Her left posterior thorax discomfort resolved w/ diuresis. She remains in atrial fibrillation but rates are mildly improved from ED rates. Currently rates in the 90s-low 100s. She is asymptomatic w/ her afib. She denies any prior h/o stroke/TIA. No h/o internal bleeding. She had a mechanical fall several years ago after missing a step, but no falls since. She lives home alone and performs all of her ADLs independently.       Past Medical History:  Diagnosis Date  . Cancer ([place])    skin cancer  . DDD (degenerative disc disease)   . Diverticulitis   . DJD (degenerative joint disease)   . GERD (gastroesophageal reflux disease)    takes ranitidine  . Heart murmur   . History of hiatal hernia   . History of pneumonia   . Hx of irritable bowel syndrome   . Hyperlipidemia   . Occasional tremors   . Osteoporosis   . Pre-diabetes   . Varicose veins   . Vitamin D deficiency     Past Surgical History:  Procedure Laterality Date  . APPENDECTOMY    . BREAST SURGERY  Right    fluid drained from breast  . COLONOSCOPY    . ESOPHAGOGASTRODUODENOSCOPY    . JOINT REPLACEMENT Left    left hip  . LUMBAR LAMINECTOMY/DECOMPRESSION MICRODISCECTOMY N/A 06/05/2015   Procedure: L3-L5 DECOMPRESSION ;  Surgeon: Briana Schools, Briana [name];  Location: [facility];  Service: Orthopedics;  Laterality: N/A;  . OPEN REDUCTION INTERNAL FIXATION (ORIF) DISTAL RADIAL FRACTURE Right 01/10/2014   Procedure: OPEN REDUCTION INTERNAL FIXATION (ORIF) DISTAL RADIAL FRACTURE;  Surgeon: Briana Hoff, Briana [name];  Location: [facility];  Service: Orthopedics;  Laterality:  Right;  . SPINAL CORD DECOMPRESSION  06/05/2015   L3 L 5  . THYROID SURGERY    . TONSILLECTOMY    . TUBAL LIGATION    . varicose veins stripped       Home Medications:  Prior to Admission medications   Medication Sig Start Date End Date Taking? Authorizing Provider  albuterol (PROVENTIL HFA;VENTOLIN HFA) 108 (90 BASE) MCG/ACT inhaler Inhale 2 puffs into the lungs every 6 (six) hours as needed for wheezing or shortness of breath. 12/04/13  Yes Vicie Mutters, [name]  Ascorbic Acid (VITAMIN C) 1000 MG tablet Take 1,000 mg by mouth daily.    Yes Provider, Historical, Briana [name]  aspirin 81 MG tablet Take 81 mg by mouth daily.   Yes Provider, Historical, [facility]ana [name]  atenolol (TENORMIN) 100 MG tablet Take 1 tablet every morning for BP & Tremor Patient taking differently: Take 100 mg by mouth daily.  09/16/18  Yes Briana Pinto, Briana [name]  b complex vitamins tablet Take 1 tablet by mouth daily.   Yes Provider, Historical, Briana [name]  bumetanide (BUMEX) 1 MG tablet TAKE 1/2-1  TABLET BY MOUTH TWICE DAILY AS NEEDED FOR SWELLING Patient taking differently: Take 1 mg by mouth daily.  06/06/18  Yes Briana Pinto, Briana [name]  Calcium Carbonate (CALCIUM 600 PO) Take 600 mg by mouth every evening.   Yes Provider, Historical, Briana [name]  Cholecalciferol (VITAMIN D3) 2000 UNITS TABS Take 2,000 Units by mouth 2 (two) times daily.    Yes Provider, Historical, Briana [name]  fluticasone (FLONASE) 50 MCG/ACT nasal spray Place 2 sprays into both nostrils daily. Patient taking differently: Place 2 sprays into both nostrils as needed.  02/28/18  Yes Briana Comber, NP  MELATONIN PO Take 10 mg by mouth at bedtime. Takes 1 at bedtime    Yes Provider, Historical, Briana [name]  Misc Natural Products (OSTEO BI-FLEX JOINT SHIELD PO) Take 1 tablet by mouth 2 (two) times daily.    Yes Provider, Historical, Briana [name]    Inpatient Medications: Scheduled Meds: . aspirin EC  81 mg Oral Daily  . atenolol  100 mg Oral Daily  . bumetanide  1 mg Oral Daily  . enoxaparin (LOVENOX) injection  75  mg Subcutaneous Q12H  . Melatonin  9 mg Oral QHS   Continuous Infusions:  PRN Meds: acetaminophen, ondansetron (ZOFRAN) IV  Allergies:    Allergies  Allergen Reactions  . Accupril [Quinapril Hcl] Cough  . Neosporin [Neomycin-Bacitracin Zn-Polymyx]   . Prednisone     Agitated and shaky  . Reglan [Metoclopramide] Other (See Comments)    tremors  . Tramadol Nausea And Vomiting  . Adhesive [Tape] Rash    Social History:   Social History   Socioeconomic History  . Marital status: Widowed    Spouse name: Not on file  . Number of children: Not on file  . Years of education: Not on file  . Highest education level: Not on file  Occupational History  . Not on file  Social Needs  . Financial resource strain: Not on file  . Food insecurity:    Worry: Not on file    Inability: Not on file  . Transportation needs:    Medical: Not on file    Non-medical: Not on file  Tobacco Use  . Smoking status: Never Smoker  . Smokeless tobacco: Never Used  Substance and Sexual Activity  . Alcohol use: No  . Drug use: No  . Sexual activity: Not on file  Lifestyle  . Physical activity:    Days per week: Not on file    Minutes per session: Not on file  . Stress: Not on file  Relationships  . Social connections:    Talks on phone: Not on file    Gets together: Not on file    Attends religious service: Not on file    Active member of club or organization: Not on file    Attends meetings of clubs or organizations: Not on file    Relationship status: Not on file  . Intimate partner violence:    Fear of current or ex partner: Not on file    Emotionally abused: Not on file    Physically abused: Not on file    Forced sexual activity: Not on file  Other Topics Concern  . Not on file  Social History Narrative  . Not on file    Family History:    Family History  Problem Relation Age of Onset  . Hypertension Mother   . Hyperlipidemia Mother   . Heart disease Sister   . Cancer  Brother        prostate     ROS:  Please see the history of present illness.   All other ROS reviewed and negative.     Physical Exam/Data:   Vitals:   11/22/18 2011 11/23/18 0030 11/23/18 0502 11/23/18 1227  BP: 129/88 110/71 121/77 102/72  Pulse: 86 91 93 (!) 57  Resp: 18 18 18 16   Temp: 98.5 F (36.9 C) 97.8 F (36.6 C) 97.8 F (36.6 C) 98.2 F (36.8 C)  TempSrc: Oral Oral Oral Oral  SpO2: 95% 93% 94% 95%  Weight:  75.3 kg    Height:        Intake/Output Summary (Last 24 hours) at 11/23/2018 1348 Last data filed at 11/23/2018 1229 Gross per 24 hour  Intake 840 ml  Output 1400 ml  Net -560 ml   Filed Weights   11/21/18 2030 11/22/18 0035 11/23/18 0030  Weight: 76.2 kg 75.9 kg 75.3 kg   Body mass index is 30.34 kg/m.  General:  Elderly WF, Well nourished, well developed, in no acute distress HEENT: normal Lymph: no adenopathy Neck: no JVD Endocrine:  No thryomegaly Vascular: No carotid bruits; FA pulses 2+ bilaterally without bruits  Cardiac:  irregularly irregular rhythm, regular rate Lungs:  clear to auscultation bilaterally, no wheezing, rhonchi or rales  Abd: soft, nontender, no hepatomegaly  Ext: no edema, bilateral LE varicose veins Musculoskeletal:  No deformities, BUE and BLE strength normal and equal Skin: warm and dry  Neuro:  CNs 2-12 intact, no focal abnormalities noted Psych:  Normal affect   EKG:  The EKG was personally reviewed and demonstrates:  atrial fibrillation 110s Telemetry:  Telemetry was personally reviewed and demonstrates:  Atrial fibrillation 90s-low 100s.   Relevant CV Studies: Stress Test 08/17/2017  Study Highlights     Nuclear stress EF: 70%.  There was no ST segment deviation noted during stress.  The study is normal.  The left ventricular ejection fraction is hyperdynamic (>65%).   1. EF 70%, normal wall motion.  2. No evidence for ischemia or infarction.   Normal study.       2D echo 11/22/18 Study  Conclusions  - Procedure narrative: Transthoracic echocardiography. Image   quality was adequate. The study was technically difficult. - Left ventricle: The cavity size was normal. Wall thickness was   increased in a pattern of mild LVH. Systolic function was normal.   The estimated ejection fraction was in the range of 60% to 65%.   Wall motion was normal; there were no regional wall motion   abnormalities. The study was not technically sufficient to allow   evaluation of LV diastolic dysfunction due to atrial   fibrillation. - Aortic valve: There was trivial regurgitation directed   eccentrically in the LVOT and towards the mitral anterior   leaflet. - Left atrium: The atrium was mildly dilated. - Pulmonic valve: There was trivial regurgitation. - Pericardium, extracardiac: A trivial, free-flowing pericardial   effusion was identified circumferential to the heart.  Laboratory Data:  Chemistry Recent Labs  Lab 11/21/18 1028 11/22/18 0513 11/23/18 0810  NA 139 141 138  K 4.0 3.5 3.5  CL 107 106 103  CO2 23 26 27   GLUCOSE 164* 131* 135*  BUN 16 15 17   CREATININE 0.91 0.85 1.00  CALCIUM 9.6 9.3 9.5  GFRNONAA 59* >60 52*  GFRAA >60 >60 >60  ANIONGAP 9 9 8     No results for input(s): PROT, ALBUMIN, AST, ALT, ALKPHOS, BILITOT in the last 168 hours. Hematology Recent Labs  Lab 11/21/18 1028 11/21/18 2014 11/23/18 0810  WBC 5.1 6.1 4.8  RBC 4.40 4.36 4.56  HGB 12.8 13.1 13.3  HCT 41.2 40.8 41.8  MCV 93.6 93.6 91.7  MCH 29.1 30.0 29.2  MCHC 31.1 32.1 31.8  RDW 14.6 14.8 14.6  PLT 179 201 175   Cardiac Enzymes Recent Labs  Lab 11/21/18 1028  TROPONINI <0.03   No results for input(s): TROPIPOC in the last 168 hours.  BNP Recent Labs  Lab 11/21/18 1028  BNP 391.6*    DDimer  Recent Labs  Lab 11/21/18 1028  DDIMER 1.42*    Radiology/Studies:  Dg Chest 2 View  Result Date: 11/21/2018 CLINICAL DATA:  Shortness of breath and cough over the last several  days. EXAM: CHEST - 2 VIEW COMPARISON:  06/25/2016 FINDINGS: The heart is enlarged. There is aortic atherosclerosis. There is pulmonary venous hypertension with mild interstitial edema. There small pleural effusions. There is mild basilar atelectasis. Calcified granuloma again seen in the right lower lung. Findings are most consistent with congestive heart failure. Minor basilar pneumonia not excluded. No acute bone finding. IMPRESSION: Probable congestive heart failure with interstitial edema, small effusions and basilar atelectasis. Basilar pneumonia not excluded. Electronically Signed   By: Briana [name] M.D.   On: 11/21/2018 10:11   Ct Angio Chest Pe W/cm &/or Wo Cm  Result Date: 11/21/2018 CLINICAL DATA:  Shortness of breath.  Positive D-dimer. EXAM: CT ANGIOGRAPHY CHEST WITH CONTRAST TECHNIQUE: Multidetector CT imaging of the chest was performed using the standard protocol during bolus administration of intravenous contrast. Multiplanar CT image reconstructions and MIPs were obtained to evaluate the vascular anatomy. CONTRAST:  6mL ISOVUE-370 IOPAMIDOL (ISOVUE-370) INJECTION 76% COMPARISON:  Chest radiograph 11/21/2018 FINDINGS: Cardiovascular: Satisfactory opacification of the pulmonary arteries to the segmental level. No evidence of pulmonary embolism. Heart size appears to be within normal  limits. No significant pericardial fluid. Atherosclerotic calcifications in the thoracic aorta. Mediastinum/Nodes: Small to moderate sized hiatal hernia. Mediastinal tissue appears to be mildly prominent but limited evaluation due to the artifact from the adjacent contrast. No significant hilar lymphadenopathy. Small amount of fluid or edema in the right cardiophrenic fat. Lungs/Pleura: Trachea and mainstem bronchi are patent. Small amount of posterior pleural-based thickening in both lungs, right side greater than left. Difficult to exclude trace amount of pleural fluid. Trachea and mainstem bronchi are patent. Large  calcified granuloma in the right lower lobe measures up to 1.0 cm. Scattered areas of septal thickening in the lungs. No large areas of lung consolidation. Mild volume loss in both lower lobes. Upper Abdomen: Low-density structure in the left kidney upper pole probably represents a cyst but incompletely evaluated. Mild fullness in the left adrenal tissue. Musculoskeletal: No suspicious bone findings. Review of the MIP images confirms the above findings. IMPRESSION: 1. Negative for a pulmonary embolism. 2. Scattered areas of septal thickening with trace pleural fluid versus pleural thickening. Findings could be associated with vascular congestion or mild fluid overload. 3. No large areas of lung consolidation or airspace disease. 4. Old granulomatous disease. 5. Hiatal hernia. 6.  Aortic Atherosclerosis (ICD10-I70.0). Electronically Signed   By: Briana [name] M.D.   On: 11/21/2018 14:38    Assessment and Plan:   STACYANN MCCONAUGHY is a 85 y.o. female with a hx HTN, HLD, type II diabetes, varicose veins, diverticulosis, GERD and hiatal hernia who is being seen today for new onset atrial fibrillation and acute HFpEF, at the request of Briana [name], Internal Medicine.   1. Atrial Fibrillation: new diagnosis. Duration unknown. TSH, electrolytes, H/H and renal function normal. Mild CHF upon admit. Echo with normal LVEF, no significant valvular abnormalities and mildly dilated LA. Initial rates were in the 110s on admit and pt continued on home dose of atenolol 100 mg.  She remains in atrial fibrillation but rates are mildly improved from ED rates. Current rates in the 90s-low 100s. She is asymptomatic w/ her afib. She denies any prior h/o stroke/TIA. No h/o internal bleeding. She had a mechanical fall several years ago after missing a step, but no falls since. She lives home alone and performs all of her ADLs independently. Recommend continuation of rate control now, w/  blocker. Her CHA2DS2 VASc score is 6 for CHF, HTN, DM,  Age > 53 and female sex. This patients CHA2DS2-VASc Score and unadjusted Ischemic Stroke Rate (% per year) is equal to 9.7 % stroke rate/year from a score of 6. Recommend initiation of an oral anticoagulant for stroke prophylaxis. Can obtain repeat EKG as an outpatient, and consider DCCV, after 3 weeks of uninterrupted anticoagulation, if still in afib.    2. Acute HFpEF: pt presented w/ dyspnea in the setting of new afib. BNP and CXR c/w CHF. BNP 391 on admit. Echo shows normal LVEF at 60-65%. The study was not technically sufficient to allow evaluation of LV diastolic dysfunction due to atrial fibrillation. She has been treated w/ IV Lasix. She diuresed.  Net I/Os negative 2.5L total. She has been transitioned back to PO diuretics, continued on home dose of Bumex, 1 mg daily and she is feeling better. Renal function and electrolytes have remained stable. Continue PO diuretics. Recommend low sodium diet and daily weights at home once discharged.   3. HTN: controlled on current regimen.   4. Type II DM: management per IM.  5. GERD/ Hiatal Hernia: pt will  start oral anticoagulant for stroke prophylaxis for afib. Recommend discontinuation of ASA to reduce risk of GIB. Also consider addition of a PPI for GI protection. Will defer to primary team.   Cardiology clinic f/u has been arranged in 3 weeks on 12/15/18. Appt date and time is in AVS. She will get repeat EKG and if still in afib, set up for outpatient DCCV.    For questions or updates, please contact [place] Please consult [name] for contact info under     Signed, Briana Jester, [name]  11/23/2018 1:48 PM ---------------------------------------------------------------------------------------------   History and all data above reviewed.  Patient examined.  I agree with the findings as above.  Auriella TAMBERLY POMPLUN is a pleasant 85 year old female with essential tremor and newly diagnosed atrial fibrillation. She is reasonable well  rate controlled on her home atenolol dose of 100 mg daily. This medication was prescribed for the dual benefit of antihypertensive and treatment for tremor. She feels her previous beta blocker (presumably propranolol) was better at controlling her tremor, but overall she is doing well. She is limited in her ability to eat with utensils when her tremor is active.   Her chads vasc score is 6, she is not currently on anticoagulation.   Constitutional: No acute distress Eyes: pupils equally round and reactive to light, sclera non-icteric, normal conjunctiva and lids ENMT: normal dentition, moist mucous membranes Cardiovascular: irregular rhythm, normal rate, no murmurs. S1 and S2 normal. Radial pulses normal bilaterally. No jugular venous distention.  Respiratory: clear to auscultation bilaterally GI : normal bowel sounds, soft and nontender. No distention.   MSK: extremities warm, well perfused. No edema.  NEURO: grossly nonfocal exam, moves all extremities. PSYCH: alert and oriented x 3, normal mood and affect.  Skin: venous varicosities.  All available labs, radiology testing, previous records reviewed. Agree with documented assessment and plan of my colleague as stated above with the following additions or changes:  Principal Problem:   Acute on chronic diastolic (congestive) heart failure (HCC) Active Problems:   Essential hypertension   Hyperlipidemia   Prediabetes   CKD (chronic kidney disease) stage 3, GFR 30-59 ml/min (HCC)   Unspecified atrial fibrillation ([place])    Plan: With her creatinine clearance and age, I would recommend an agent other than atenolol, however, an additional medication or alternate therapy will need to be added for tremor since this limits her lifestyle. For the current time, it is reasonable to continue atenolol until close follow up. Ideally she should transition to metoprolol in the near future, but this is unlikely to be beneficial for tremor.   She should  begin Eliquis (copay of $47 a month for this patient as suggested by case management). If that is not cost prohibitive for her. Discontinue aspirin in the setting of a DOAC since her aspirin was for primary prevention. She does however appear to have coronary artery calcifications.  She can continue her home bumex.   [facility] will sign off.   Medication Recommendations:  START: eliquis 5 mg BID. CONTINUE:  Atenolol, bumex. STOP: aspirin, lovenox Other recommendations (labs, testing, etc): - Follow up as an outpatient:  Cardiology 12/15/2018 for consideration for DCCV.   Length of Stay:  LOS: 0 days   Elouise Munroe, [name] 6:43 PM  11/23/2018

## 2018-11-23 NOTE — Progress Notes (Signed)
Physical Therapy Treatment Patient Details Name: Briana [name] MRN: [id number] DOB: [birth year] Today's Date: 11/23/2018    History of Present Illness Patient is a 85 y/o female who presents with SOB and left side pain. Admitted with new onset CHF and new onset A-fib. CXR-mild vascular congestion. PMH includes DM, back surgery, HLD.    PT Comments    Pt progressing towards goals. Improved mobility tolerance noted this session, and HR ranging from low 100s-low 110s during gait, with brief period of elevation to 122 bpm. Used rollator this session, and pt with improved steadiness and confidence during gait, so updated DME recommendations for home. Required min guard to supervision with mobility tasks. Will continue to follow acutely to maximize functional mobility independence and safety.    Follow Up Recommendations  No PT follow up;Supervision - Intermittent     Equipment Recommendations  Other (comment)(rollator )    Recommendations for Other Services       Precautions / Restrictions Precautions Precautions: Fall;Other (comment) Precaution Comments: watch HR Restrictions Weight Bearing Restrictions: No    Mobility  Bed Mobility Overal bed mobility: Modified Independent                Transfers Overall transfer level: Needs assistance Equipment used: 4-wheeled walker Transfers: Sit to/from Stand Sit to Stand: Supervision         General transfer comment: Cues to lock brakes on rollator prior to standing/sitting and for safe hand placement. Supervision for safety.   Ambulation/Gait Ambulation/Gait assistance: Min guard;Supervision Gait Distance (Feet): 150 Feet Assistive device: 4-wheeled walker Gait Pattern/deviations: Step-through pattern;Decreased stride length;Drifts right/left Gait velocity: decreased   General Gait Details: Slow, guarded gait. Improved steadiness during gait using rollator and pt reports improved confidence with use of rollator. Educated  about use of rollator at home, especially for longer distances, for energy conservation. HR in low 100s to low 110s, with brief period of elevation to 122 bpm.    Stairs             Wheelchair Mobility    Modified Rankin (Stroke Patients Only)       Balance Overall balance assessment: Needs assistance Sitting-balance support: Feet supported;No upper extremity supported Sitting balance-Leahy Scale: Good     Standing balance support: Bilateral upper extremity supported;No upper extremity supported;During functional activity Standing balance-Leahy Scale: Fair Standing balance comment: Able to maintain static standing without UE support.                             Cognition Arousal/Alertness: Awake/alert Behavior During Therapy: WFL for tasks assessed/performed Overall Cognitive Status: Within Functional Limits for tasks assessed                                        Exercises      General Comments        Pertinent Vitals/Pain Pain Assessment: No/denies pain    Home Living                      Prior Function            PT Goals (current goals can now be found in the care plan section) Acute Rehab PT Goals Patient Stated Goal: to get back to walking PT Goal Formulation: With patient Time For Goal Achievement: 12/06/18 Potential to Achieve Goals: Good Progress  towards PT goals: Progressing toward goals    Frequency    Min 3X/week      PT Plan Equipment recommendations need to be updated    Co-evaluation              AM-PAC PT "6 Clicks" Mobility   Outcome Measure  Help needed turning from your back to your side while in a flat bed without using bedrails?: None Help needed moving from lying on your back to sitting on the side of a flat bed without using bedrails?: None Help needed moving to and from a bed to a chair (including a wheelchair)?: None Help needed standing up from a chair using your arms  (e.g., wheelchair or bedside chair)?: None Help needed to walk in hospital room?: A Little Help needed climbing 3-5 steps with a railing? : A Little 6 Click Score: 22    End of Session Equipment Utilized During Treatment: Gait belt Activity Tolerance: Patient tolerated treatment well Patient left: in chair;with call bell/phone within reach;with chair alarm set Nurse Communication: Mobility status PT Visit Diagnosis: Muscle weakness (generalized) (M62.81);Unsteadiness on feet (R26.81)     Time: 7416-3845 PT Time Calculation (min) (ACUTE ONLY): 18 min  Charges:  $Gait Training: 8-22 mins                     Briana [name], PT, DPT  Acute Rehabilitation Services  Pager: [phone] Office: [phone]    Briana [name] 11/23/2018, 11:58 AM

## 2018-11-23 NOTE — H&P (View-Only) (Signed)
Cardiology Consultation:   Patient ID: Briana [name] MRN: [id number]; DOB: 10-14-1934  Admit date: 11/21/2018 Date of Consult: 11/23/2018  Primary Care Provider: Unk Pinto, MD Primary Cardiologist: Shelva Majestic, MD  Primary Electrophysiologist:  None    Patient Profile:   Briana [name] is a 85 y.o. female with a hx HTN, HLD, type II diabetes, diverticulosis, varicose veins, GERD and hiatal hernia who is being seen today for new onset atrial fibrillation and acute HFpEF, at the request of Briana [name], Internal Medicine.    History of Present Illness:   Briana [name] has a PMH notable for HTN, HLD, type II diabetes, varicose veins, diverticulosis, GERD and hiatal hernia.   In 2018, she was referred to Briana [name] by her PCP for cardiac evaluation given SOB w/ exertion, fatigue and atypical chest pain. Subsequently, she underwent a nuclear stress test on 08/18/2017.  This was entirely normal and showed an EF of 70%, no ST segment changes and had normal myocardial perfusion without scar or ischemia.  A 2-D echo Doppler study on 08/20/2017 showed an EF of 60-65%.  There was grade 2 diastolic dysfunction.  She had mild aortic insufficiency, mild MR, trivial TR and incidentally a trivial pericardial effusion was identified. Her symptoms at that time were felt to be noncardiac and her chest pain c/w musculoskeletal etiology. Pt was reassured and instructed to f/u PRN. She has not been seen since 2018. She follows reguarlly w/ her PCP, Briana [name].   She presented to the [facility] ED on 11/21/18 with complaints of SOB and left posterior chest discomfort. No anterior CP. In the ED, she was found to be in new onset atrial fibrillation and in acute CHF. V-rates were in the 110s. CXR showed interstitial edema, small effusions and basilar atelectasis. Chest CT negative for PE. BNP elevated at 391. POC troponin negative. TSH WNL. K 4.0 on admit. SCr 0.85 on admit. H/H WNL. Pt was admitted by IM to telemetry. Her home  atenolol was continued for rate control for her afib. She was started on Lovenox with plans to transition to an oral anticoagulant. IV Lasix was given for CHF. Echo was obtained and showed normal LVEF at 60-65% with mild LVH. No significant valvular abnormalities. The LA is mildly dilated.   Pt diuresed 2.5L and was transitioned back to her home PO diuretic. She is feeling better. Her left posterior thorax discomfort resolved w/ diuresis. She remains in atrial fibrillation but rates are mildly improved from ED rates. Currently rates in the 90s-low 100s. She is asymptomatic w/ her afib. She denies any prior h/o stroke/TIA. No h/o internal bleeding. She had a mechanical fall several years ago after missing a step, but no falls since. She lives home alone and performs all of her ADLs independently.       Past Medical History:  Diagnosis Date  . Cancer ([place])    skin cancer  . DDD (degenerative disc disease)   . Diverticulitis   . DJD (degenerative joint disease)   . GERD (gastroesophageal reflux disease)    takes ranitidine  . Heart murmur   . History of hiatal hernia   . History of pneumonia   . Hx of irritable bowel syndrome   . Hyperlipidemia   . Occasional tremors   . Osteoporosis   . Pre-diabetes   . Varicose veins   . Vitamin D deficiency     Past Surgical History:  Procedure Laterality Date  . APPENDECTOMY    . BREAST SURGERY  Right    fluid drained from breast  . COLONOSCOPY    . ESOPHAGOGASTRODUODENOSCOPY    . JOINT REPLACEMENT Left    left hip  . LUMBAR LAMINECTOMY/DECOMPRESSION MICRODISCECTOMY N/A 06/05/2015   Procedure: L3-L5 DECOMPRESSION ;  Surgeon: Melina Schools, MD;  Location: [place];  Service: Orthopedics;  Laterality: N/A;  . OPEN REDUCTION INTERNAL FIXATION (ORIF) DISTAL RADIAL FRACTURE Right 01/10/2014   Procedure: OPEN REDUCTION INTERNAL FIXATION (ORIF) DISTAL RADIAL FRACTURE;  Surgeon: Linna Hoff, MD;  Location: [place];  Service: Orthopedics;  Laterality:  Right;  . SPINAL CORD DECOMPRESSION  06/05/2015   L3 L 5  . THYROID SURGERY    . TONSILLECTOMY    . TUBAL LIGATION    . varicose veins stripped       Home Medications:  Prior to Admission medications   Medication Sig Start Date End Date Taking? Authorizing Provider  albuterol (PROVENTIL HFA;VENTOLIN HFA) 108 (90 BASE) MCG/ACT inhaler Inhale 2 puffs into the lungs every 6 (six) hours as needed for wheezing or shortness of breath. 12/04/13  Yes Vicie Mutters, Briana [name]  Ascorbic Acid (VITAMIN C) 1000 MG tablet Take 1,000 mg by mouth daily.    Yes [provider]  aspirin 81 MG tablet Take 81 mg by mouth daily.   Yes [provider]  atenolol (TENORMIN) 100 MG tablet Take 1 tablet every morning for BP & Tremor Patient taking differently: Take 100 mg by mouth daily.  09/16/18  Yes Briana Pinto, MD  b complex vitamins tablet Take 1 tablet by mouth daily.   Yes [provider]  bumetanide (BUMEX) 1 MG tablet TAKE 1/2-1  TABLET BY MOUTH TWICE DAILY AS NEEDED FOR SWELLING Patient taking differently: Take 1 mg by mouth daily.  06/06/18  Yes Briana Pinto, MD  Calcium Carbonate (CALCIUM 600 PO) Take 600 mg by mouth every evening.   Yes [provider]  Cholecalciferol (VITAMIN D3) 2000 UNITS TABS Take 2,000 Units by mouth 2 (two) times daily.    Yes [provider]  fluticasone (FLONASE) 50 MCG/ACT nasal spray Place 2 sprays into both nostrils daily. Patient taking differently: Place 2 sprays into both nostrils as needed.  02/28/18  Yes [name], NP  MELATONIN PO Take 10 mg by mouth at bedtime. Takes 1 at bedtime    Yes [provider]  Misc Natural Products (OSTEO BI-FLEX JOINT SHIELD PO) Take 1 tablet by mouth 2 (two) times daily.    Yes [provider]    Inpatient Medications: Scheduled Meds: . aspirin EC  81 mg Oral Daily  . atenolol  100 mg Oral Daily  . bumetanide  1 mg Oral Daily  . enoxaparin (LOVENOX) injection  75  mg Subcutaneous Q12H  . Melatonin  9 mg Oral QHS   Continuous Infusions:  PRN Meds: acetaminophen, ondansetron (ZOFRAN) IV  Allergies:    Allergies  Allergen Reactions  . Accupril [Quinapril Hcl] Cough  . Neosporin [Neomycin-Bacitracin Zn-Polymyx]   . Prednisone     Agitated and shaky  . Reglan [Metoclopramide] Other (See Comments)    tremors  . Tramadol Nausea And Vomiting  . Adhesive [Tape] Rash    Social History:   Social History   Socioeconomic History  . Marital status: Widowed    Spouse name: Not on file  . Number of children: Not on file  . Years of education: Not on file  . Highest education level: Not on file  Occupational History  . Not on file  Social Needs  . Financial resource strain: Not on file  . Food insecurity:    Worry: Not on file    Inability: Not on file  . Transportation needs:    Medical: Not on file    Non-medical: Not on file  Tobacco Use  . Smoking status: Never Smoker  . Smokeless tobacco: Never Used  Substance and Sexual Activity  . Alcohol use: No  . Drug use: No  . Sexual activity: Not on file  Lifestyle  . Physical activity:    Days per week: Not on file    Minutes per session: Not on file  . Stress: Not on file  Relationships  . Social connections:    Talks on phone: Not on file    Gets together: Not on file    Attends religious service: Not on file    Active member of club or organization: Not on file    Attends meetings of clubs or organizations: Not on file    Relationship status: Not on file  . Intimate partner violence:    Fear of current or ex partner: Not on file    Emotionally abused: Not on file    Physically abused: Not on file    Forced sexual activity: Not on file  Other Topics Concern  . Not on file  Social History Narrative  . Not on file    Family History:    Family History  Problem Relation Age of Onset  . Hypertension Mother   . Hyperlipidemia Mother   . Heart disease Sister   . Cancer  Brother        prostate     ROS:  Please see the history of present illness.   All other ROS reviewed and negative.     Physical Exam/Data:   Vitals:   11/22/18 2011 11/23/18 0030 11/23/18 0502 11/23/18 1227  BP: 129/88 110/71 121/77 102/72  Pulse: 86 91 93 (!) 57  Resp: 18 18 18 16   Temp: 98.5 F (36.9 C) 97.8 F (36.6 C) 97.8 F (36.6 C) 98.2 F (36.8 C)  TempSrc: Oral Oral Oral Oral  SpO2: 95% 93% 94% 95%  Weight:  75.3 kg    Height:        Intake/Output Summary (Last 24 hours) at 11/23/2018 1348 Last data filed at 11/23/2018 1229 Gross per 24 hour  Intake 840 ml  Output 1400 ml  Net -560 ml   Filed Weights   11/21/18 2030 11/22/18 0035 11/23/18 0030  Weight: 76.2 kg 75.9 kg 75.3 kg   Body mass index is 30.34 kg/m.  General:  Elderly WF, Well nourished, well developed, in no acute distress HEENT: normal Lymph: no adenopathy Neck: no JVD Endocrine:  No thryomegaly Vascular: No carotid bruits; FA pulses 2+ bilaterally without bruits  Cardiac:  irregularly irregular rhythm, regular rate Lungs:  clear to auscultation bilaterally, no wheezing, rhonchi or rales  Abd: soft, nontender, no hepatomegaly  Ext: no edema, bilateral LE varicose veins Musculoskeletal:  No deformities, BUE and BLE strength normal and equal Skin: warm and dry  Neuro:  CNs 2-12 intact, no focal abnormalities noted Psych:  Normal affect   EKG:  The EKG was personally reviewed and demonstrates:  atrial fibrillation 110s Telemetry:  Telemetry was personally reviewed and demonstrates:  Atrial fibrillation 90s-low 100s.   Relevant CV Studies: Stress Test 08/17/2017  Study Highlights     Nuclear stress EF: 70%.  There was no ST segment deviation noted during stress.  The study is normal.  The left ventricular ejection fraction is hyperdynamic (>65%).   1. EF 70%, normal wall motion.  2. No evidence for ischemia or infarction.   Normal study.       2D echo 11/22/18 Study  Conclusions  - Procedure narrative: Transthoracic echocardiography. Image   quality was adequate. The study was technically difficult. - Left ventricle: The cavity size was normal. Wall thickness was   increased in a pattern of mild LVH. Systolic function was normal.   The estimated ejection fraction was in the range of 60% to 65%.   Wall motion was normal; there were no regional wall motion   abnormalities. The study was not technically sufficient to allow   evaluation of LV diastolic dysfunction due to atrial   fibrillation. - Aortic valve: There was trivial regurgitation directed   eccentrically in the LVOT and towards the mitral anterior   leaflet. - Left atrium: The atrium was mildly dilated. - Pulmonic valve: There was trivial regurgitation. - Pericardium, extracardiac: A trivial, free-flowing pericardial   effusion was identified circumferential to the heart.  Laboratory Data:  Chemistry Recent Labs  Lab 11/21/18 1028 11/22/18 0513 11/23/18 0810  NA 139 141 138  K 4.0 3.5 3.5  CL 107 106 103  CO2 23 26 27   GLUCOSE 164* 131* 135*  BUN 16 15 17   CREATININE 0.91 0.85 1.00  CALCIUM 9.6 9.3 9.5  GFRNONAA 59* >60 52*  GFRAA >60 >60 >60  ANIONGAP 9 9 8     No results for input(s): PROT, ALBUMIN, AST, ALT, ALKPHOS, BILITOT in the last 168 hours. Hematology Recent Labs  Lab 11/21/18 1028 11/21/18 2014 11/23/18 0810  WBC 5.1 6.1 4.8  RBC 4.40 4.36 4.56  HGB 12.8 13.1 13.3  HCT 41.2 40.8 41.8  MCV 93.6 93.6 91.7  MCH 29.1 30.0 29.2  MCHC 31.1 32.1 31.8  RDW 14.6 14.8 14.6  PLT 179 201 175   Cardiac Enzymes Recent Labs  Lab 11/21/18 1028  TROPONINI <0.03   No results for input(s): TROPIPOC in the last 168 hours.  BNP Recent Labs  Lab 11/21/18 1028  BNP 391.6*    DDimer  Recent Labs  Lab 11/21/18 1028  DDIMER 1.42*    Radiology/Studies:  Dg Chest 2 View  Result Date: 11/21/2018 CLINICAL DATA:  Shortness of breath and cough over the last several  days. EXAM: CHEST - 2 VIEW COMPARISON:  06/25/2016 FINDINGS: The heart is enlarged. There is aortic atherosclerosis. There is pulmonary venous hypertension with mild interstitial edema. There small pleural effusions. There is mild basilar atelectasis. Calcified granuloma again seen in the right lower lung. Findings are most consistent with congestive heart failure. Minor basilar pneumonia not excluded. No acute bone finding. IMPRESSION: Probable congestive heart failure with interstitial edema, small effusions and basilar atelectasis. Basilar pneumonia not excluded. Electronically Signed   By: Nelson Chimes M.D.   On: 11/21/2018 10:11   Ct Angio Chest Pe W/cm &/or Wo Cm  Result Date: 11/21/2018 CLINICAL DATA:  Shortness of breath.  Positive D-dimer. EXAM: CT ANGIOGRAPHY CHEST WITH CONTRAST TECHNIQUE: Multidetector CT imaging of the chest was performed using the standard protocol during bolus administration of intravenous contrast. Multiplanar CT image reconstructions and MIPs were obtained to evaluate the vascular anatomy. CONTRAST:  32mL ISOVUE-370 IOPAMIDOL (ISOVUE-370) INJECTION 76% COMPARISON:  Chest radiograph 11/21/2018 FINDINGS: Cardiovascular: Satisfactory opacification of the pulmonary arteries to the segmental level. No evidence of pulmonary embolism. Heart size appears to be within normal  limits. No significant pericardial fluid. Atherosclerotic calcifications in the thoracic aorta. Mediastinum/Nodes: Small to moderate sized hiatal hernia. Mediastinal tissue appears to be mildly prominent but limited evaluation due to the artifact from the adjacent contrast. No significant hilar lymphadenopathy. Small amount of fluid or edema in the right cardiophrenic fat. Lungs/Pleura: Trachea and mainstem bronchi are patent. Small amount of posterior pleural-based thickening in both lungs, right side greater than left. Difficult to exclude trace amount of pleural fluid. Trachea and mainstem bronchi are patent. Large  calcified granuloma in the right lower lobe measures up to 1.0 cm. Scattered areas of septal thickening in the lungs. No large areas of lung consolidation. Mild volume loss in both lower lobes. Upper Abdomen: Low-density structure in the left kidney upper pole probably represents a cyst but incompletely evaluated. Mild fullness in the left adrenal tissue. Musculoskeletal: No suspicious bone findings. Review of the MIP images confirms the above findings. IMPRESSION: 1. Negative for a pulmonary embolism. 2. Scattered areas of septal thickening with trace pleural fluid versus pleural thickening. Findings could be associated with vascular congestion or mild fluid overload. 3. No large areas of lung consolidation or airspace disease. 4. Old granulomatous disease. 5. Hiatal hernia. 6.  Aortic Atherosclerosis (ICD10-I70.0). Electronically Signed   By: Briana [name] M.D.   On: 11/21/2018 14:38    Assessment and Plan:   DENEEN SLAGER is a 85 y.o. female with a hx HTN, HLD, type II diabetes, varicose veins, diverticulosis, GERD and hiatal hernia who is being seen today for new onset atrial fibrillation and acute HFpEF, at the request of Briana [name], Internal Medicine.   1. Atrial Fibrillation: new diagnosis. Duration unknown. TSH, electrolytes, H/H and renal function normal. Mild CHF upon admit. Echo with normal LVEF, no significant valvular abnormalities and mildly dilated LA. Initial rates were in the 110s on admit and pt continued on home dose of atenolol 100 mg.  She remains in atrial fibrillation but rates are mildly improved from ED rates. Current rates in the 90s-low 100s. She is asymptomatic w/ her afib. She denies any prior h/o stroke/TIA. No h/o internal bleeding. She had a mechanical fall several years ago after missing a step, but no falls since. She lives home alone and performs all of her ADLs independently. Recommend continuation of rate control now, w/  blocker. Her CHA2DS2 VASc score is 6 for CHF, HTN, DM,  Age > 39 and female sex. This patients CHA2DS2-VASc Score and unadjusted Ischemic Stroke Rate (% per year) is equal to 9.7 % stroke rate/year from a score of 6. Recommend initiation of an oral anticoagulant for stroke prophylaxis. Can obtain repeat EKG as an outpatient, and consider DCCV, after 3 weeks of uninterrupted anticoagulation, if still in afib.    2. Acute HFpEF: pt presented w/ dyspnea in the setting of new afib. BNP and CXR c/w CHF. BNP 391 on admit. Echo shows normal LVEF at 60-65%. The study was not technically sufficient to allow evaluation of LV diastolic dysfunction due to atrial fibrillation. She has been treated w/ IV Lasix. She diuresed.  Net I/Os negative 2.5L total. She has been transitioned back to PO diuretics, continued on home dose of Bumex, 1 mg daily and she is feeling better. Renal function and electrolytes have remained stable. Continue PO diuretics. Recommend low sodium diet and daily weights at home once discharged.   3. HTN: controlled on current regimen.   4. Type II DM: management per IM.  5. GERD/ Hiatal Hernia: pt will  start oral anticoagulant for stroke prophylaxis for afib. Recommend discontinuation of ASA to reduce risk of GIB. Also consider addition of a PPI for GI protection. Will defer to primary team.   Cardiology clinic f/u has been arranged in 3 weeks on 12/15/18. Appt date and time is in AVS. She will get repeat EKG and if still in afib, set up for outpatient DCCV.    For questions or updates, please contact [place] Please consult [email or web address] for contact info under     Signed, Briana Jester, Briana [name]  11/23/2018 1:48 PM ---------------------------------------------------------------------------------------------   History and all data above reviewed.  Patient examined.  I agree with the findings as above.  Briana [name] ANNDREA MIHELICH is a pleasant 85 year old female with essential tremor and newly diagnosed atrial fibrillation. She is reasonable well  rate controlled on her home atenolol dose of 100 mg daily. This medication was prescribed for the dual benefit of antihypertensive and treatment for tremor. She feels her previous beta blocker (presumably propranolol) was better at controlling her tremor, but overall she is doing well. She is limited in her ability to eat with utensils when her tremor is active.   Her chads vasc score is 6, she is not currently on anticoagulation.   Constitutional: No acute distress Eyes: pupils equally round and reactive to light, sclera non-icteric, normal conjunctiva and lids ENMT: normal dentition, moist mucous membranes Cardiovascular: irregular rhythm, normal rate, no murmurs. S1 and S2 normal. Radial pulses normal bilaterally. No jugular venous distention.  Respiratory: clear to auscultation bilaterally GI : normal bowel sounds, soft and nontender. No distention.   MSK: extremities warm, well perfused. No edema.  NEURO: grossly nonfocal exam, moves all extremities. PSYCH: alert and oriented x 3, normal mood and affect.  Skin: venous varicosities.  All available labs, radiology testing, previous records reviewed. Agree with documented assessment and plan of my colleague as stated above with the following additions or changes:  Principal Problem:   Acute on chronic diastolic (congestive) heart failure (HCC) Active Problems:   Essential hypertension   Hyperlipidemia   Prediabetes   CKD (chronic kidney disease) stage 3, GFR 30-59 ml/min (HCC)   Unspecified atrial fibrillation ([place])    Plan: With her creatinine clearance and age, I would recommend an agent other than atenolol, however, an additional medication or alternate therapy will need to be added for tremor since this limits her lifestyle. For the current time, it is reasonable to continue atenolol until close follow up. Ideally she should transition to metoprolol in the near future, but this is unlikely to be beneficial for tremor.   She should  begin Eliquis (copay of $47 a month for this patient as suggested by case management). If that is not cost prohibitive for her. Discontinue aspirin in the setting of a DOAC since her aspirin was for primary prevention. She does however appear to have coronary artery calcifications.  She can continue her home bumex.   [facility] will sign off.   Medication Recommendations:  START: eliquis 5 mg BID. CONTINUE:  Atenolol, bumex. STOP: aspirin, lovenox Other recommendations (labs, testing, etc): - Follow up as an outpatient:  Cardiology 12/15/2018 for consideration for DCCV.   Length of Stay:  LOS: 0 days   Elouise Munroe, MD [facility] 6:43 PM  11/23/2018

## 2018-11-24 DIAGNOSIS — I5033 Acute on chronic diastolic (congestive) heart failure: Secondary | ICD-10-CM | POA: Diagnosis not present

## 2018-11-24 LAB — BASIC METABOLIC PANEL
Anion gap: 10 (ref 5–15)
BUN: 24 mg/dL — ABNORMAL HIGH (ref 8–23)
CO2: 26 mmol/L (ref 22–32)
Calcium: 9.1 mg/dL (ref 8.9–10.3)
Chloride: 102 mmol/L (ref 98–111)
Creatinine, Ser: 1.04 mg/dL — ABNORMAL HIGH (ref 0.44–1.00)
GFR calc Af Amer: 58 mL/min — ABNORMAL LOW (ref >60)
GFR calc non Af Amer: 50 mL/min — ABNORMAL LOW (ref >60)
Glucose, Bld: 109 mg/dL — ABNORMAL HIGH (ref 70–99)
Potassium: 3.5 mmol/L (ref 3.5–5.1)
Sodium: 138 mmol/L (ref 135–145)

## 2018-11-24 MED ORDER — APIXABAN 5 MG PO TABS
5.0000 mg | ORAL_TABLET | Freq: Two times a day (BID) | ORAL | Status: DC
  Administered 2018-11-24: 5 mg via ORAL
  Filled 2018-11-24: qty 1

## 2018-11-24 MED ORDER — APIXABAN 5 MG PO TABS: 5.0000 mg | ORAL_TABLET | Freq: Two times a day (BID) | ORAL | 0 refills | Status: DC

## 2018-11-24 MED FILL — ELIQUIS 5 MG TABLET: 5 | 30 days supply | Qty: 60 | Fill #0

## 2018-11-24 NOTE — Progress Notes (Signed)
Patient ready for discharge, waiting for daughter to pick her up.

## 2018-11-24 NOTE — Discharge Instructions (Signed)
Information on my medicine - ELIQUIS (apixaban)  This medication education was reviewed with me or my healthcare representative as part of my discharge preparation.  The pharmacist that spoke with me during my hospital stay was:  Tad Moore, [facility]  Why was Eliquis prescribed for you? Eliquis was prescribed for you to reduce the risk of forming blood clots that can cause a stroke if you have a medical condition called atrial fibrillation (a type of irregular heartbeat) OR to reduce the risk of a blood clots forming after orthopedic surgery.  What do You need to know about Eliquis ? Take your Eliquis TWICE DAILY - one tablet in the morning and one tablet in the evening with or without food.  It would be best to take the doses about the same time each day.  If you have difficulty swallowing the tablet whole please discuss with your pharmacist how to take the medication safely.  Take Eliquis exactly as prescribed by your doctor and DO NOT stop taking Eliquis without talking to the doctor who prescribed the medication.  Stopping may increase your risk of developing a new clot or stroke.  Refill your prescription before you run out.  After discharge, you should have regular check-up appointments with your healthcare provider that is prescribing your Eliquis.  In the future your dose may need to be changed if your kidney function or weight changes by a significant amount or as you get older.  What do you do if you miss a dose? If you miss a dose, take it as soon as you remember on the same day and resume taking twice daily.  Do not take more than one dose of ELIQUIS at the same time.  Important Safety Information A possible side effect of Eliquis is bleeding. You should call your healthcare provider right away if you experience any of the following: ? Bleeding from an injury or your nose that does not stop. ? Unusual colored urine (red or dark brown) or unusual colored stools (red or  black). ? Unusual bruising for unknown reasons. ? A serious fall or if you hit your head (even if there is no bleeding).  Some medicines may interact with Eliquis and might increase your risk of bleeding or clotting while on Eliquis. To help avoid this, consult your healthcare provider or pharmacist prior to using any new prescription or non-prescription medications, including herbals, vitamins, non-steroidal anti-inflammatory drugs (NSAIDs) and supplements.  This website has more information on Eliquis (apixaban): [email or web address].

## 2018-11-24 NOTE — Progress Notes (Signed)
Physical Therapy Treatment Patient Details Name: Briana [name] MRN: [id number] DOB: 1934-06-27 Today's Date: 11/24/2018    History of Present Illness Patient is a 85 y/o female who presents with SOB and left side pain. Admitted with new onset CHF and new onset A-fib. CXR-mild vascular congestion. PMH includes DM, back surgery, HLD.    PT Comments    Pt progressing towards goals. Practiced gait and stair navigation prior to d/c. Required min guard to supervision for gait and stair navigation this session. Pt with some fatigue and required seated rest during gait. HR ranging from mid 90s to mid 110s. Current recommendations appropriate. Will continue to follow acutely to maximize functional mobility independence and safety.    Follow Up Recommendations  No PT follow up;Supervision - Intermittent     Equipment Recommendations  Other (comment)(rollator)    Recommendations for Other Services       Precautions / Restrictions Precautions Precautions: Fall;Other (comment) Precaution Comments: watch HR Restrictions Weight Bearing Restrictions: No    Mobility  Bed Mobility               General bed mobility comments: In chair upon entry.   Transfers Overall transfer level: Needs assistance Equipment used: 4-wheeled walker Transfers: Sit to/from Stand Sit to Stand: Supervision         General transfer comment: Pt with good technique and locked brakes appropriately. Cues to bring rollator all the way back to chair with her prior to sitting.   Ambulation/Gait Ambulation/Gait assistance: Supervision;Min guard Gait Distance (Feet): 200 Feet Assistive device: 4-wheeled walker Gait Pattern/deviations: Step-through pattern;Decreased stride length;Drifts right/left Gait velocity: decreased   General Gait Details: Slow, cautious gait. Overall steady with use of rollator, however, required seated rest secondary to fatigue. HR ranging from mid 90s to mid 110s.    Stairs Stairs:  Yes Stairs assistance: Min guard Stair Management: One rail Left;Step to pattern;Forwards Number of Stairs: 2 General stair comments: Min guard for safety and no LOB noted. Cues for LE sequencing and educated to use sideways technique if needed at home to increase safety.    Wheelchair Mobility    Modified Rankin (Stroke Patients Only)       Balance Overall balance assessment: Needs assistance Sitting-balance support: Feet supported;No upper extremity supported Sitting balance-Leahy Scale: Good     Standing balance support: Bilateral upper extremity supported;No upper extremity supported;During functional activity Standing balance-Leahy Scale: Fair Standing balance comment: Able to maintain static standing without UE support.                             Cognition Arousal/Alertness: Awake/alert Behavior During Therapy: WFL for tasks assessed/performed Overall Cognitive Status: Within Functional Limits for tasks assessed                                        Exercises      General Comments General comments (skin integrity, edema, etc.): Educated about energy conservation techniques to use at home.       Pertinent Vitals/Pain Pain Assessment: No/denies pain    Home Living                      Prior Function            PT Goals (current goals can now be found in the care plan section) Acute  Rehab PT Goals Patient Stated Goal: to get back to walking PT Goal Formulation: With patient Time For Goal Achievement: 12/06/18 Potential to Achieve Goals: Good Progress towards PT goals: Progressing toward goals    Frequency    Min 3X/week      PT Plan Current plan remains appropriate    Co-evaluation              AM-PAC PT "6 Clicks" Mobility   Outcome Measure  Help needed turning from your back to your side while in a flat bed without using bedrails?: None Help needed moving from lying on your back to sitting on the  side of a flat bed without using bedrails?: None Help needed moving to and from a bed to a chair (including a wheelchair)?: None Help needed standing up from a chair using your arms (e.g., wheelchair or bedside chair)?: None Help needed to walk in hospital room?: A Little Help needed climbing 3-5 steps with a railing? : A Little 6 Click Score: 22    End of Session Equipment Utilized During Treatment: Gait belt Activity Tolerance: Patient tolerated treatment well Patient left: in chair;with call bell/phone within reach Nurse Communication: Mobility status PT Visit Diagnosis: Muscle weakness (generalized) (M62.81);Unsteadiness on feet (R26.81)     Time: 7915-0569 PT Time Calculation (min) (ACUTE ONLY): 19 min  Charges:  $Gait Training: 8-22 mins                     Leighton Ruff, PT, DPT  Acute Rehabilitation Services  Pager: [phone] Office: [phone]    Briana [name] 11/24/2018, 12:21 PM

## 2018-11-24 NOTE — Discharge Summary (Signed)
Physician Discharge Summary  Briana [name] OZD:[id number] DOB: [birth year] DOA: 11/21/2018  PCP: Unk Pinto, MD  Admit date: 11/21/2018 Discharge date: 11/24/2018  Admitted From: Home Disposition: Home  Recommendations for Outpatient Follow-up:  1. Follow up with PCP in 1-2 weeks 2. Please obtain BMP/CBC in one week your next doctors visit.  3. Eliquis 5 mg twice daily, stop aspirin in the meantime 4. Follow-up appointment outpatient cardiology has been made on 12/15/2018 for cardioversion  Home Health: None Equipment/Devices: None Discharge Condition: Stable CODE STATUS: Full code Diet recommendation: 2 g salt  Brief/Interim Summary: 85 year old with past medical history of prediabetes, hyperlipidemia, diverticulitis came to the hospital complains of shortness of breath she was found to be in CHF exacerbation and new onset atrial fibrillation.During the hospitalization patient was diuresed very briefly and then transition to her oral Bumex.  For atrial fibrillation her oral atenolol was continued which controlled her heart rate.  Lovenox was transitioned to Eliquis.  Cardiology had seen the patient during the hospitalization.  Outpatient cardioversion appointment made on 12/15/2018.  Aspirin has been stopped. Today patient has reached maximal benefit from hospital stay and stable for discharge.   Discharge Diagnoses:  Principal Problem:   Acute on chronic diastolic (congestive) heart failure (HCC) Active Problems:   Essential hypertension   Hyperlipidemia   Prediabetes   CKD (chronic kidney disease) stage 3, GFR 30-59 ml/min (HCC)   Unspecified atrial fibrillation (HCC)  New onset persistent atrial fibrillation -Currently rate controlled on atenolol 100 mg daily -Outpatient cardioversion appointment made on 12/15/2018 -Eliquis 5 mg twice daily.  Stop aspirin  Chronic diastolic congestive heart failure, class II, ejection fraction 60 to 65% - Patient appears to be euvolemic.  We  will transition to oral Bumex 1 mg daily.  Follow-up outpatient.  Essential hypertension -Continue atenolol  CKD stage II -Renal function is stable  Patient on Lovenox during the hospitalization, will be discharged on Eliquis Spoke with the patient's daughter over the phone on the day of discharge as well   Discharge Instructions   Allergies as of 11/24/2018      Reactions   Accupril [quinapril Hcl] Cough   Neosporin [neomycin-bacitracin Zn-polymyx]    Prednisone    Agitated and shaky   Reglan [metoclopramide] Other (See Comments)   tremors   Tramadol Nausea And Vomiting   Adhesive [tape] Rash      Medication List    STOP taking these medications   aspirin 81 MG tablet     TAKE these medications   albuterol 108 (90 Base) MCG/ACT inhaler Commonly known as:  PROVENTIL HFA;VENTOLIN HFA Inhale 2 puffs into the lungs every 6 (six) hours as needed for wheezing or shortness of breath.   apixaban 5 MG Tabs tablet Commonly known as:  ELIQUIS Take 1 tablet (5 mg total) by mouth 2 (two) times daily.   atenolol 100 MG tablet Commonly known as:  TENORMIN Take 1 tablet every morning for BP & Tremor What changed:    how much to take  how to take this  when to take this  additional instructions   b complex vitamins tablet Take 1 tablet by mouth daily.   bumetanide 1 MG tablet Commonly known as:  BUMEX TAKE 1/2-1  TABLET BY MOUTH TWICE DAILY AS NEEDED FOR SWELLING What changed:    how much to take  how to take this  when to take this  additional instructions   CALCIUM 600 PO Take 600 mg by mouth every evening.  fluticasone 50 MCG/ACT nasal spray Commonly known as:  FLONASE Place 2 sprays into both nostrils daily. What changed:    when to take this  reasons to take this   MELATONIN PO Take 10 mg by mouth at bedtime. Takes 1 at bedtime   OSTEO BI-FLEX JOINT SHIELD PO Take 1 tablet by mouth 2 (two) times daily.   vitamin C 1000 MG tablet Take 1,000  mg by mouth daily.   Vitamin D3 50 MCG (2000 UT) Tabs Take 2,000 Units by mouth 2 (two) times daily.      Follow-up Information    Erlene Quan, PA-C Follow up on 12/15/2018.   Specialties:  Cardiology, Radiology Why:  10:30 AM (with Dr. Evette Georges PA)  Contact information: [address] [place] [address] [phone]        Unk Pinto, MD. Schedule an appointment as soon as possible for a visit in 10 day(s).   Specialty:  Internal Medicine Contact information: [address] [place] [address] [phone]        Troy Sine, MD .   Specialty:  Cardiology Contact information: [address] [place] [address] [phone]          Allergies  Allergen Reactions  . Accupril [Quinapril Hcl] Cough  . Neosporin [Neomycin-Bacitracin Zn-Polymyx]   . Prednisone     Agitated and shaky  . Reglan [Metoclopramide] Other (See Comments)    tremors  . Tramadol Nausea And Vomiting  . Adhesive [Tape] Rash    You were cared for by a hospitalist during your hospital stay. If you have any questions about your discharge medications or the care you received while you were in the hospital after you are discharged, you can call the unit and asked to speak with the hospitalist on call if the hospitalist that took care of you is not available. Once you are discharged, your primary care physician will handle any further medical issues. Please note that no refills for any discharge medications will be authorized once you are discharged, as it is imperative that you return to your primary care physician (or establish a relationship with a primary care physician if you do not have one) for your aftercare needs so that they can reassess your need for medications and monitor your lab values.  Consultations:  Cardiology   Procedures/Studies: Dg Chest 2 View  Result Date: 11/21/2018 CLINICAL DATA:  Shortness of breath and cough  over the last several days. EXAM: CHEST - 2 VIEW COMPARISON:  06/25/2016 FINDINGS: The heart is enlarged. There is aortic atherosclerosis. There is pulmonary venous hypertension with mild interstitial edema. There small pleural effusions. There is mild basilar atelectasis. Calcified granuloma again seen in the right lower lung. Findings are most consistent with congestive heart failure. Minor basilar pneumonia not excluded. No acute bone finding. IMPRESSION: Probable congestive heart failure with interstitial edema, small effusions and basilar atelectasis. Basilar pneumonia not excluded. Electronically Signed   By: Nelson Chimes M.D.   On: 11/21/2018 10:11   Ct Angio Chest Pe W/cm &/or Wo Cm  Result Date: 11/21/2018 CLINICAL DATA:  Shortness of breath.  Positive D-dimer. EXAM: CT ANGIOGRAPHY CHEST WITH CONTRAST TECHNIQUE: Multidetector CT imaging of the chest was performed using the standard protocol during bolus administration of intravenous contrast. Multiplanar CT image reconstructions and MIPs were obtained to evaluate the vascular anatomy. CONTRAST:  31mL ISOVUE-370 IOPAMIDOL (ISOVUE-370) INJECTION 76% COMPARISON:  Chest radiograph 11/21/2018 FINDINGS: Cardiovascular: Satisfactory opacification of the  pulmonary arteries to the segmental level. No evidence of pulmonary embolism. Heart size appears to be within normal limits. No significant pericardial fluid. Atherosclerotic calcifications in the thoracic aorta. Mediastinum/Nodes: Small to moderate sized hiatal hernia. Mediastinal tissue appears to be mildly prominent but limited evaluation due to the artifact from the adjacent contrast. No significant hilar lymphadenopathy. Small amount of fluid or edema in the right cardiophrenic fat. Lungs/Pleura: Trachea and mainstem bronchi are patent. Small amount of posterior pleural-based thickening in both lungs, right side greater than left. Difficult to exclude trace amount of pleural fluid. Trachea and mainstem  bronchi are patent. Large calcified granuloma in the right lower lobe measures up to 1.0 cm. Scattered areas of septal thickening in the lungs. No large areas of lung consolidation. Mild volume loss in both lower lobes. Upper Abdomen: Low-density structure in the left kidney upper pole probably represents a cyst but incompletely evaluated. Mild fullness in the left adrenal tissue. Musculoskeletal: No suspicious bone findings. Review of the MIP images confirms the above findings. IMPRESSION: 1. Negative for a pulmonary embolism. 2. Scattered areas of septal thickening with trace pleural fluid versus pleural thickening. Findings could be associated with vascular congestion or mild fluid overload. 3. No large areas of lung consolidation or airspace disease. 4. Old granulomatous disease. 5. Hiatal hernia. 6.  Aortic Atherosclerosis (ICD10-I70.0). Electronically Signed   By: Markus Daft M.D.   On: 11/21/2018 14:38     Subjective: Patient is any complaints, feels better.  Sitting up in the chair eating breakfast.   Discharge Exam: Vitals:   11/24/18 0830 11/24/18 1121  BP: 104/88 110/84  Pulse: (!) 103 66  Resp:    Temp:  98.1 F (36.7 C)  SpO2:  96%   Vitals:   11/23/18 2117 11/24/18 0406 11/24/18 0830 11/24/18 1121  BP: 111/72 108/84 104/88 110/84  Pulse: 61 64 (!) 103 66  Resp: 20 20    Temp: 98.2 F (36.8 C) 97.7 F (36.5 C)  98.1 F (36.7 C)  TempSrc: Oral Oral  Oral  SpO2: 92% 92%  96%  Weight:  75.1 kg    Height:        General: Pt is alert, awake, not in acute distress Cardiovascular: Irregularly irregular, S1/S2 +, no rubs, no gallops Respiratory: CTA bilaterally, no wheezing, no rhonchi Abdominal: Soft, NT, ND, bowel sounds + Extremities: no edema, no cyanosis    The results of significant diagnostics from this hospitalization (including imaging, microbiology, ancillary and laboratory) are listed below for reference.     Microbiology: No results found for this or any  previous visit (from the past 240 hour(s)).   Labs: BNP (last 3 results) Recent Labs    11/21/18 1028  BNP 161.0*   Basic Metabolic Panel: Recent Labs  Lab 11/21/18 1028 11/22/18 0513 11/23/18 0810 11/24/18 0357  NA 139 141 138 138  K 4.0 3.5 3.5 3.5  CL 107 106 103 102  CO2 23 26 27 26   GLUCOSE 164* 131* 135* 109*  BUN 16 15 17  24*  CREATININE 0.91 0.85 1.00 1.04*  CALCIUM 9.6 9.3 9.5 9.1   Liver Function Tests: No results for input(s): AST, ALT, ALKPHOS, BILITOT, PROT, ALBUMIN in the last 168 hours. No results for input(s): LIPASE, AMYLASE in the last 168 hours. No results for input(s): AMMONIA in the last 168 hours. CBC: Recent Labs  Lab 11/21/18 1028 11/21/18 2014 11/23/18 0810  WBC 5.1 6.1 4.8  NEUTROABS 3.4 3.6  --   HGB  12.8 13.1 13.3  HCT 41.2 40.8 41.8  MCV 93.6 93.6 91.7  PLT 179 201 175   Cardiac Enzymes: Recent Labs  Lab 11/21/18 1028  TROPONINI <0.03   BNP: Invalid input(s): POCBNP CBG: Recent Labs  Lab 11/22/18 0759 11/22/18 1124 11/22/18 1742 11/22/18 2057 11/23/18 0732  GLUCAP 144* 126* 112* 160* 123*   D-Dimer No results for input(s): DDIMER in the last 72 hours. Hgb A1c Recent Labs    11/21/18 2014  HGBA1C 6.2*   Lipid Profile No results for input(s): CHOL, HDL, LDLCALC, TRIG, CHOLHDL, LDLDIRECT in the last 72 hours. Thyroid function studies Recent Labs    11/22/18 2004  TSH 0.950   Anemia work up No results for input(s): VITAMINB12, FOLATE, FERRITIN, TIBC, IRON, RETICCTPCT in the last 72 hours. Urinalysis    Component Value Date/Time   COLORURINE YELLOW 11/18/2017 1044   APPEARANCEUR CLEAR 11/18/2017 1044   LABSPEC 1.017 11/18/2017 1044   PHURINE 7.0 11/18/2017 1044   GLUCOSEU NEGATIVE 11/18/2017 1044   HGBUR NEGATIVE 11/18/2017 1044   BILIRUBINUR NEGATIVE 10/19/2016 1128   KETONESUR NEGATIVE 11/18/2017 1044   PROTEINUR NEGATIVE 11/18/2017 1044   UROBILINOGEN 0.2 12/12/2012 1240   NITRITE NEGATIVE 11/18/2017  1044   LEUKOCYTESUR NEGATIVE 11/18/2017 1044   Sepsis Labs Invalid input(s): PROCALCITONIN,  WBC,  LACTICIDVEN Microbiology No results found for this or any previous visit (from the past 240 hour(s)).   Time coordinating discharge:  I have spent 35 minutes face to face with the patient and on the ward discussing the patients care, assessment, plan and disposition with other care givers. >50% of the time was devoted counseling the patient about the risks and benefits of treatment/Discharge disposition and coordinating care.   SIGNED:   Damita Lack, MD  [facility] 11/24/2018, 12:07 PM Pager   If 7PM-7AM, please contact night-coverage [email or web address] Password [email or web address]

## 2018-11-24 NOTE — Plan of Care (Signed)
  Problem: Education: Goal: Knowledge of General Education information will improve Description Including pain rating scale, medication(s)/side effects and non-pharmacologic comfort measures Outcome: Adequate for Discharge   Problem: Health Behavior/Discharge Planning: Goal: Ability to manage health-related needs will improve Outcome: Adequate for Discharge   

## 2018-11-29 ENCOUNTER — Telehealth: Payer: Self-pay | Admitting: *Deleted

## 2018-11-29 NOTE — Telephone Encounter (Signed)
Called patient on 11/29/2018 , 10:28 AM in an attempt to reach the patient for a hospital follow up.   Admit date: 11/21/18 Discharge: 11/24/18   She DOES NOT  have any questions or concerns about medications from the hospital admission. The patient's medications were reviewed over the phone, they were counseled to bring in all current medications to the hospital follow up visit.   I advised the patient to call if any questions or concerns arise about the hospital admission or medications    Home health WAS NOT started in the hospital.  All questions were answered and a follow up appointment was made.   Prior to Admission medications   Medication Sig Start Date End Date Taking? Authorizing Provider  albuterol (PROVENTIL HFA;VENTOLIN HFA) 108 (90 BASE) MCG/ACT inhaler Inhale 2 puffs into the lungs every 6 (six) hours as needed for wheezing or shortness of breath. 12/04/13   Vicie Mutters, PA-C  apixaban (ELIQUIS) 5 MG TABS tablet Take 1 tablet (5 mg total) by mouth 2 (two) times daily. 11/24/18   Amin, Jeanella Flattery, MD  Ascorbic Acid (VITAMIN C) 1000 MG tablet Take 1,000 mg by mouth daily.     [provider]  atenolol (TENORMIN) 100 MG tablet Take 1 tablet every morning for BP & Tremor Patient taking differently: Take 100 mg by mouth daily.  09/16/18   Unk Pinto, MD  b complex vitamins tablet Take 1 tablet by mouth daily.    [provider]  bumetanide (BUMEX) 1 MG tablet TAKE 1/2-1  TABLET BY MOUTH TWICE DAILY AS NEEDED FOR SWELLING Patient taking differently: Take 1 mg by mouth daily.  06/06/18   Unk Pinto, MD  Calcium Carbonate (CALCIUM 600 PO) Take 600 mg by mouth every evening.    [provider]  Cholecalciferol (VITAMIN D3) 2000 UNITS TABS Take 2,000 Units by mouth 2 (two) times daily.     [provider]  fluticasone (FLONASE) 50 MCG/ACT nasal spray Place 2 sprays into both nostrils daily. Patient taking differently: Place 2 sprays into both  nostrils as needed.  02/28/18   [name], NP  MELATONIN PO Take 10 mg by mouth at bedtime. Takes 1 at bedtime     [provider]  Misc Natural Products (OSTEO BI-FLEX JOINT SHIELD PO) Take 1 tablet by mouth 2 (two) times daily.     [provider]

## 2018-11-29 NOTE — Telephone Encounter (Signed)
Patient has follow up office visit. Knows to call with any questions or concerns.   

## 2018-11-30 ENCOUNTER — Ambulatory Visit (INDEPENDENT_AMBULATORY_CARE_PROVIDER_SITE_OTHER): Payer: Medicare Other | Admitting: Internal Medicine

## 2018-11-30 ENCOUNTER — Encounter: Payer: Self-pay | Admitting: Internal Medicine

## 2018-11-30 ENCOUNTER — Encounter: Payer: Self-pay | Admitting: Adult Health

## 2018-11-30 VITALS — BP 116/80 | HR 64 | Temp 97.3°F | Resp 16 | Ht 61.5 in | Wt 165.2 lb

## 2018-11-30 DIAGNOSIS — I10 Essential (primary) hypertension: Secondary | ICD-10-CM | POA: Diagnosis not present

## 2018-11-30 DIAGNOSIS — N183 Chronic kidney disease, stage 3 unspecified: Secondary | ICD-10-CM

## 2018-11-30 DIAGNOSIS — I5032 Chronic diastolic (congestive) heart failure: Secondary | ICD-10-CM

## 2018-11-30 DIAGNOSIS — I5033 Acute on chronic diastolic (congestive) heart failure: Secondary | ICD-10-CM

## 2018-11-30 DIAGNOSIS — Z79899 Other long term (current) drug therapy: Secondary | ICD-10-CM

## 2018-11-30 DIAGNOSIS — I4819 Other persistent atrial fibrillation: Secondary | ICD-10-CM | POA: Diagnosis not present

## 2018-11-30 DIAGNOSIS — E782 Mixed hyperlipidemia: Secondary | ICD-10-CM

## 2018-11-30 DIAGNOSIS — R7309 Other abnormal glucose: Secondary | ICD-10-CM

## 2018-11-30 NOTE — Patient Instructions (Signed)

## 2018-11-30 NOTE — Progress Notes (Signed)
Briana [name]     This very nice 85 y.o. WWF  was admitted to the hospital on 11/21/2018 and patient was discharged from the hospital on  11/24/2018. The patient now presents for follow up for transition from recent hospitalization.  The day after discharge  our clinical staff contacted the patient to assure stability and schedule a follow up appointment. The discharge summary, medications and diagnostic test results were reviewed before meeting with the patient. The patient was admitted for:   New onset persistent atrial fibrillation Acute on chronic diastolic (congestive) heart failure (HCC) Chronic diastolic congestive heart failure, class II, ejection fraction 60 to 65% Essential hypertension CKD (chronic kidney disease) stage 3, GFR 30-59 ml/min     Patient was hospitalized with new onset Afib and acute on chronic diastolic HF. BNP was elevated.  She was diuresed and anticoagulated w/ Eliquis in anticipation of late CV. Cardiac enzymes,  & D-dimer were negative. She was diuresed with clinical improvement of her presenting c/o dyspnea. She was discharged home on Eliquis & prn Bumex and continued on Atenolol for rate control.      Hospitalization discharge instructions and medications are reconciled with the patient.      Patient is also followed with Hypertension, Hyperlipidemia, Pre-Diabetes and Vitamin D Deficiency.      Patient is treated for HTN (1996) & BP has been controlled at home. Today's BP is at goal - 116/80. Patient has had no complaints of any cardiac type chest pain, palpitations, dyspnea/orthopnea/PND, dizziness, claudication, or dependent edema.     Hyperlipidemia is not controlled with diet. Patient is adverse to taking meds for Cholesterol.  Last Lipids were not at goal:  Lab Results  Component Value Date   CHOL 207 (H) 09/02/2018   HDL 54 09/02/2018   LDLCALC 133 (H) 09/02/2018   TRIG 92 09/02/2018   CHOLHDL 3.8 09/02/2018      Also, the patient has  Obesity (BMI 30+) and  history of T2_NIDDM (A1c 6.6% / 2011) which she's attempting controi with diet.  She symptoms of reactive hypoglycemia, diabetic polys, paresthesias or visual blurring.  Last A1c was not at goal: Lab Results  Component Value Date   HGBA1C 6.2 (H) 11/21/2018      Further, the patient also has history of Vitamin D Deficiency (23" / 2008)  and supplements vitamin D without any suspected side-effects. Last vitamin D was at goal:  Lab Results  Component Value Date   VD25OH 71 09/02/2018   Current Outpatient Medications on File Prior to Visit  Medication Sig  . albuterol (PROVENTIL HFA;VENTOLIN HFA) 108 (90 BASE) MCG/ACT inhaler Inhale 2 puffs into the lungs every 6 (six) hours as needed for wheezing or shortness of breath.  Marland Kitchen apixaban (ELIQUIS) 5 MG TABS tablet Take 1 tablet (5 mg total) by mouth 2 (two) times daily.  . Ascorbic Acid (VITAMIN C) 1000 MG tablet Take 1,000 mg by mouth daily.   Marland Kitchen atenolol (TENORMIN) 100 MG tablet Take 1 tablet every morning for BP & Tremor (Patient taking differently: Take 100 mg by mouth daily. )  . b complex vitamins tablet Take 1 tablet by mouth daily.  . bumetanide (BUMEX) 1 MG tablet TAKE 1/2-1  TABLET BY MOUTH TWICE DAILY AS NEEDED FOR SWELLING (Patient taking differently: Take 1 mg by mouth daily. )  . Calcium Carbonate (CALCIUM 600 PO) Take 600 mg by mouth every evening.  . Cholecalciferol (VITAMIN D3) 2000 UNITS TABS Take 2,000 Units by mouth  2 (two) times daily.   . fluticasone (FLONASE) 50 MCG/ACT nasal spray Place 2 sprays into both nostrils daily. (Patient taking differently: Place 2 sprays into both nostrils as needed. )  . MELATONIN PO Take 10 mg by mouth at bedtime. Takes 1 at bedtime   . Misc Natural Products (OSTEO BI-FLEX JOINT SHIELD PO) Take 1 tablet by mouth 2 (two) times daily.    No current facility-administered medications on file prior to visit.    Allergies  Allergen Reactions  . Accupril [Quinapril Hcl] Cough  .  Neosporin [Neomycin-Bacitracin Zn-Polymyx]   . Prednisone     Agitated and shaky  . Reglan [Metoclopramide] Other (See Comments)    tremors  . Tramadol Nausea And Vomiting  . Adhesive [Tape] Rash   PMHx:   Past Medical History:  Diagnosis Date  . Cancer ([place])    skin cancer  . DDD (degenerative disc disease)   . Diverticulitis   . DJD (degenerative joint disease)   . GERD (gastroesophageal reflux disease)    takes ranitidine  . Heart murmur   . History of hiatal hernia   . History of pneumonia   . Hx of irritable bowel syndrome   . Hyperlipidemia   . Occasional tremors   . Osteoporosis   . Pre-diabetes   . Varicose veins   . Vitamin D deficiency    Immunization History  Administered Date(s) Administered  . DT 09/05/2015  . DTaP 11/16/2004  . Influenza Whole 08/30/2013  . Influenza, High Dose Seasonal PF 08/28/2015, 08/10/2016  . Pneumococcal Conjugate-13 10/19/2016  . Pneumococcal Polysaccharide-23 11/16/2001  . Zoster 06/01/2013   Past Surgical History:  Procedure Laterality Date  . APPENDECTOMY    . BREAST SURGERY Right    fluid drained from breast  . COLONOSCOPY    . ESOPHAGOGASTRODUODENOSCOPY    . JOINT REPLACEMENT Left    left hip  . LUMBAR LAMINECTOMY/DECOMPRESSION MICRODISCECTOMY N/A 06/05/2015   Procedure: L3-L5 DECOMPRESSION ;  Surgeon: Melina Schools, MD;  Location: [facility];  Service: Orthopedics;  Laterality: N/A;  . OPEN REDUCTION INTERNAL FIXATION (ORIF) DISTAL RADIAL FRACTURE Right 01/10/2014   Procedure: OPEN REDUCTION INTERNAL FIXATION (ORIF) DISTAL RADIAL FRACTURE;  Surgeon: Linna Hoff, MD;  Location: [facility];  Service: Orthopedics;  Laterality: Right;  . SPINAL CORD DECOMPRESSION  06/05/2015   L3 L 5  . THYROID SURGERY    . TONSILLECTOMY    . TUBAL LIGATION    . varicose veins stripped     FHx:    Reviewed / unchanged  SHx:    Reviewed / unchanged  Systems Review:  Constitutional: Denies fever, chills, wt changes, headaches, insomnia,  fatigue, night sweats, change in appetite. Eyes: Denies redness, blurred vision, diplopia, discharge, itchy, watery eyes.  ENT: Denies discharge, congestion, post nasal drip, epistaxis, sore throat, earache, hearing loss, dental pain, tinnitus, vertigo, sinus pain, snoring.  CV: Denies chest pain, palpitations, irregular heartbeat, syncope, dyspnea, diaphoresis, orthopnea, PND, claudication or edema. Respiratory: denies cough, dyspnea, DOE, pleurisy, hoarseness, laryngitis, wheezing.  Gastrointestinal: Denies dysphagia, odynophagia, heartburn, reflux, water brash, abdominal pain or cramps, nausea, vomiting, bloating, diarrhea, constipation, hematemesis, melena, hematochezia  or hemorrhoids. Genitourinary: Denies dysuria, frequency, urgency, nocturia, hesitancy, discharge, hematuria or flank pain. Musculoskeletal: Denies arthralgias, myalgias, stiffness, jt. swelling, pain, limping or strain/sprain.  Skin: Denies pruritus, rash, hives, warts, acne, eczema or change in skin lesion(s). Neuro: No weakness, tremor, incoordination, spasms, paresthesia or pain. Psychiatric: Denies confusion, memory loss or sensory loss. Endo: Denies change  in weight, skin or hair change.  Heme/Lymph: No excessive bleeding, bruising or enlarged lymph nodes.  Physical Exam  BP 116/80   Pulse 64   Temp (!) 97.3 F (36.3 C)   Resp 16   Ht 5' 1.5" (1.562 m)   Wt 165 lb 3.2 oz (74.9 kg)   BMI 30.71 kg/m   Appears well nourished, well groomed  and in no distress.  Eyes: PERRLA, EOMs, conjunctiva no swelling or erythema. Sinuses: No frontal/maxillary tenderness ENT/Mouth: EAC's clear, TM's nl w/o erythema, bulging. Nares clear w/o erythema, swelling, exudates. Oropharynx clear without erythema or exudates. Oral hygiene is good. Tongue normal, non obstructing. Hearing intact.  Neck: Supple. Thyroid nl. Car 2+/2+ without bruits, nodes or JVD. Chest: Respirations nl with BS clear & equal w/o rales, rhonchi, wheezing or  stridor.  Cor: Heart sounds normal w/ regular rate and rhythm without sig. murmurs, gallops, clicks or rubs. Peripheral pulses normal and equal  without edema.  Abdomen: Soft & bowel sounds normal. Non-tender w/o guarding, rebound, hernias, masses or organomegaly.  Lymphatics: Unremarkable.  Musculoskeletal: Full ROM all peripheral extremities, joint stability, 5/5 strength and normal gait.  Skin: Warm, dry without exposed rashes, lesions or ecchymosis apparent.  Neuro: Cranial nerves intact, reflexes equal bilaterally. Sensory-motor testing grossly intact. Tendon reflexes grossly intact.  Pysch: Alert & oriented x 3.  Insight and judgement nl & appropriate. No ideations.  Assessment and Plan:  1. Atrial fibrillation, persistent  - COMPLETE METABOLIC PANEL WITH GFR - Magnesium  2. Acute on chronic diastolic heart failure (HCC)  - COMPLETE METABOLIC PANEL WITH GFR - Magnesium  3. Chronic diastolic (congestive) heart failure (HCC)  - COMPLETE METABOLIC PANEL WITH GFR - Magnesium  4. Essential hypertension  - Continue medication, monitor blood pressure at home.  - Continue DASH diet. Reminder to go to the ER if any CP,  SOB, nausea, dizziness, severe HA, changes vision/speech.  - CBC with Differential/Platelet - COMPLETE METABOLIC PANEL WITH GFR - Magnesium  5. CKD (chronic kidney disease) stage 3, GFR 30-59 ml/min (HCC)  - COMPLETE METABOLIC PANEL WITH GFR  6. Hyperlipidemia  - Continue diet/meds, exercise,& lifestyle modifications.  - Continue monitor periodic cholesterol/liver & renal functions    7. Abnormal glucose  - COMPLET - Continue diet, exercise, lifestyle modifications.  - Monitor appropriate labs.E METABOLIC PANEL WITH GFR  8. Medication management  - CBC with Differential/Platelet - COMPLETE METABOLIC PANEL WITH GFR - Magnesium       Discussed  regular exercise, BP monitoring, weight control to achieve/maintain BMI less than 25 and discussed meds  and SE's. Recommended labs to assess and monitor clinical status with further disposition pending results of labs. Over 30 minutes of exam, counseling, chart review was performed.

## 2018-12-01 LAB — COMPLETE METABOLIC PANEL WITH GFR
AG Ratio: 2.2 (calc) (ref 1.0–2.5)
ALT: 30 U/L — ABNORMAL HIGH (ref 6–29)
AST: 20 U/L (ref 10–35)
Albumin: 4.3 g/dL (ref 3.6–5.1)
Alkaline phosphatase (APISO): 83 U/L (ref 33–130)
BUN/Creatinine Ratio: 21 (calc) (ref 6–22)
BUN: 23 mg/dL (ref 7–25)
CO2: 27 mmol/L (ref 20–32)
Calcium: 10.2 mg/dL (ref 8.6–10.4)
Chloride: 102 mmol/L (ref 98–110)
Creat: 1.08 mg/dL — ABNORMAL HIGH (ref 0.60–0.88)
GFR, Est African American: 55 mL/min/{1.73_m2} — ABNORMAL LOW (ref >=60)
GFR, Est Non African American: 47 mL/min/{1.73_m2} — ABNORMAL LOW (ref >=60)
Globulin: 2 g/dL (calc) (ref 1.9–3.7)
Glucose, Bld: 168 mg/dL — ABNORMAL HIGH (ref 65–99)
Potassium: 4.1 mmol/L (ref 3.5–5.3)
Sodium: 139 mmol/L (ref 135–146)
Total Bilirubin: 0.8 mg/dL (ref 0.2–1.2)
Total Protein: 6.3 g/dL (ref 6.1–8.1)

## 2018-12-01 LAB — CBC WITH DIFFERENTIAL/PLATELET
Absolute Monocytes: 494 cells/uL (ref 200–950)
Basophils Absolute: 38 cells/uL (ref 0–200)
Basophils Relative: 0.8 %
Eosinophils Absolute: 139 cells/uL (ref 15–500)
Eosinophils Relative: 2.9 %
HCT: 42.6 % (ref 35.0–45.0)
Hemoglobin: 14.1 g/dL (ref 11.7–15.5)
Lymphs Abs: 974 cells/uL (ref 850–3900)
MCH: 30.2 pg (ref 27.0–33.0)
MCHC: 33.1 g/dL (ref 32.0–36.0)
MCV: 91.2 fL (ref 80.0–100.0)
MPV: 12.6 fL — ABNORMAL HIGH (ref 7.5–12.5)
Monocytes Relative: 10.3 %
Neutro Abs: 3154 cells/uL (ref 1500–7800)
Neutrophils Relative %: 65.7 %
Platelets: 182 10*3/uL (ref 140–400)
RBC: 4.67 10*6/uL (ref 3.80–5.10)
RDW: 13.6 % (ref 11.0–15.0)
Total Lymphocyte: 20.3 %
WBC: 4.8 10*3/uL (ref 3.8–10.8)

## 2018-12-01 LAB — MAGNESIUM: Magnesium: 2.1 mg/dL (ref 1.5–2.5)

## 2018-12-09 ENCOUNTER — Other Ambulatory Visit: Payer: Self-pay | Admitting: Internal Medicine

## 2018-12-09 ENCOUNTER — Other Ambulatory Visit: Payer: Self-pay

## 2018-12-09 MED ORDER — ALBUTEROL SULFATE HFA 108 (90 BASE) MCG/ACT IN AERS: INHALATION_SPRAY | RESPIRATORY_TRACT | 3 refills | Status: DC

## 2018-12-09 MED ORDER — ALBUTEROL SULFATE HFA 108 (90 BASE) MCG/ACT IN AERS: 2.0000 | INHALATION_SPRAY | Freq: Four times a day (QID) | RESPIRATORY_TRACT | 2 refills | Status: DC | PRN

## 2018-12-09 NOTE — Telephone Encounter (Signed)
Albuterol refill request  

## 2018-12-14 ENCOUNTER — Encounter: Payer: Self-pay | Admitting: Internal Medicine

## 2018-12-15 ENCOUNTER — Encounter: Payer: Self-pay | Admitting: Cardiology

## 2018-12-15 ENCOUNTER — Ambulatory Visit: Payer: Medicare Other | Admitting: Medical

## 2018-12-15 VITALS — BP 142/90 | HR 96 | Ht 61.5 in | Wt 165.6 lb

## 2018-12-15 DIAGNOSIS — I5032 Chronic diastolic (congestive) heart failure: Secondary | ICD-10-CM

## 2018-12-15 DIAGNOSIS — Z01812 Encounter for preprocedural laboratory examination: Secondary | ICD-10-CM

## 2018-12-15 DIAGNOSIS — E785 Hyperlipidemia, unspecified: Secondary | ICD-10-CM | POA: Diagnosis not present

## 2018-12-15 DIAGNOSIS — I10 Essential (primary) hypertension: Secondary | ICD-10-CM | POA: Diagnosis not present

## 2018-12-15 DIAGNOSIS — I4819 Other persistent atrial fibrillation: Secondary | ICD-10-CM

## 2018-12-15 MED ORDER — APIXABAN 5 MG PO TABS: 5.0000 mg | ORAL_TABLET | Freq: Two times a day (BID) | ORAL | 11 refills | Status: DC

## 2018-12-15 MED ORDER — DILTIAZEM HCL ER COATED BEADS 120 MG PO CP24: 120.0000 mg | ORAL_CAPSULE | Freq: Every day | ORAL | 3 refills | Status: DC

## 2018-12-15 NOTE — Progress Notes (Signed)
Cardiology Office Note   Date:  12/15/2018   ID:  Briana [name], DOB 1934/02/03, MRN [id number]  PCP:  Unk Pinto, MD  Cardiologist:  Shelva Majestic, MD EP: None  Chief Complaint  Patient presents with  . Hospitalization Follow-up    atrial fibrillation      History of Present Illness: Briana [name] is a 85 y.o. female with PMH of chronic diastolic CHF, HTN, HLD, varicose veins, GERD, and recent hospitalization 11/21/17-11/24/17 where she was diagnosed with atrial fibrillation who presents for post-hospital follow-up.  She was last seen by cardiology, Dr. Margaretann Loveless, during recent hospitalization 11/23/2018 in consultation for new onset atrial fibrillation and acute on chronic diastolic CHF. She presented with SOB and was found to be in atrial fibrillation. CXR and BNP suggested CHF. She was started on lovenox and transitioned to eliquis at discharge for stroke ppx. She was diuresed with IV lasix and transitioned to home bumex prior to discharge. She was recommended to follow-up in 3 weeks for consideration of cardioversion if still in atrial fibrillation and compliant with eliquis.   She returns today with her daughter for post-hospital follow-up of atrial fibrillation. She reports still having DOE. Prior to her afib diagnosis she would be able to walk 14 laps (with breaks) at a small carpeted track at her church, however after discharge she has only been able to complete 5 labs (with breaks) due to DOE and fatigue. She reports occasional palpitations. She denies chest pain, SOB at rest, orthopnea, PND, LE edema, changes in weight, dizziness, lightheadedness, or syncope.     Past Medical History:  Diagnosis Date  . Cancer ([place])    skin cancer  . DDD (degenerative disc disease)   . Diverticulitis   . DJD (degenerative joint disease)   . GERD (gastroesophageal reflux disease)    takes ranitidine  . Heart murmur   . History of hiatal hernia   . History of pneumonia   . Hx of  irritable bowel syndrome   . Hyperlipidemia   . Occasional tremors   . Osteoporosis   . Pre-diabetes   . Varicose veins   . Vitamin D deficiency     Past Surgical History:  Procedure Laterality Date  . APPENDECTOMY    . BREAST SURGERY Right    fluid drained from breast  . COLONOSCOPY    . ESOPHAGOGASTRODUODENOSCOPY    . JOINT REPLACEMENT Left    left hip  . LUMBAR LAMINECTOMY/DECOMPRESSION MICRODISCECTOMY N/A 06/05/2015   Procedure: L3-L5 DECOMPRESSION ;  Surgeon: Melina Schools, MD;  Location: [facility];  Service: Orthopedics;  Laterality: N/A;  . OPEN REDUCTION INTERNAL FIXATION (ORIF) DISTAL RADIAL FRACTURE Right 01/10/2014   Procedure: OPEN REDUCTION INTERNAL FIXATION (ORIF) DISTAL RADIAL FRACTURE;  Surgeon: Linna Hoff, MD;  Location: [facility];  Service: Orthopedics;  Laterality: Right;  . SPINAL CORD DECOMPRESSION  06/05/2015   L3 L 5  . THYROID SURGERY    . TONSILLECTOMY    . TUBAL LIGATION    . varicose veins stripped       Current Outpatient Medications  Medication Sig Dispense Refill  . albuterol (PROVENTIL HFA;VENTOLIN HFA) 108 (90 Base) MCG/ACT inhaler Use 2 inhalations 15-20 minutes apart every 4 hours as needed to rescue Asthma 3 Inhaler 3  . apixaban (ELIQUIS) 5 MG TABS tablet Take 1 tablet (5 mg total) by mouth 2 (two) times daily. 60 tablet 11  . Ascorbic Acid (VITAMIN C) 1000 MG tablet Take 1,000 mg by mouth  daily.     . atenolol (TENORMIN) 100 MG tablet Take 1 tablet every morning for BP & Tremor (Patient taking differently: Take 100 mg by mouth daily. ) 90 tablet 3  . b complex vitamins tablet Take 1 tablet by mouth daily.    . bumetanide (BUMEX) 1 MG tablet TAKE 1/2-1  TABLET BY MOUTH TWICE DAILY AS NEEDED FOR SWELLING (Patient taking differently: Take 1 mg by mouth daily. ) 90 tablet 1  . Cholecalciferol (VITAMIN D3) 2000 UNITS TABS Take 2,000 Units by mouth 2 (two) times daily.     Marland Kitchen MELATONIN PO Take 10 mg by mouth at bedtime. Takes 1 at bedtime     . Misc  Natural Products (OSTEO BI-FLEX JOINT SHIELD PO) Take 1 tablet by mouth 2 (two) times daily.     Marland Kitchen diltiazem (CARDIZEM CD) 120 MG 24 hr capsule Take 1 capsule (120 mg total) by mouth daily. 90 capsule 3  . fluticasone (FLONASE) 50 MCG/ACT nasal spray Place 2 sprays into both nostrils daily. (Patient not taking: Reported on 12/15/2018) 16 g 1   No current facility-administered medications for this visit.     Allergies:   Accupril [quinapril hcl]; Neosporin [neomycin-bacitracin zn-polymyx]; Prednisone; Reglan [metoclopramide]; Tramadol; and Adhesive [tape]    Social History:  The patient  reports that she has never smoked. She has never used smokeless tobacco. She reports that she does not drink alcohol or use drugs.   Family History:  The patient's family history includes Cancer in her brother; Heart disease in her sister; Hyperlipidemia in her mother; Hypertension in her mother.    ROS:  Please see the history of present illness.   Otherwise, review of systems are positive for none.   All other systems are reviewed and negative.    PHYSICAL EXAM: VS:  BP (!) 142/90   Pulse 96   Ht 5' 1.5" (1.562 m)   Wt 165 lb 9.6 oz (75.1 kg)   BMI 30.78 kg/m  , BMI Body mass index is 30.78 kg/m. GEN: Well nourished, well developed, in no acute distress HEENT: sclera anicteric Neck: no JVD, carotid bruits, or masses Cardiac: IRIR; no murmurs, rubs, or gallops, no edema  Respiratory:  clear to auscultation bilaterally, normal work of breathing GI: soft, nontender, nondistended, + BS MS: no deformity or atrophy Skin: warm and dry, no rash Neuro:  Strength and sensation are intact Psych: euthymic mood, full affect   EKG:  EKG is ordered today. The ekg ordered today demonstrates atrial fibrillation with rate 96 bpm, no STE/D   Recent Labs: 11/21/2018: B Natriuretic Peptide 391.6 11/22/2018: TSH 0.950 11/30/2018: ALT 30; BUN 23; Creat 1.08; Hemoglobin 14.1; Magnesium 2.1; Platelets 182; Potassium  4.1; Sodium 139    Lipid Panel    Component Value Date/Time   CHOL 207 (H) 09/02/2018 1018   TRIG 92 09/02/2018 1018   HDL 54 09/02/2018 1018   CHOLHDL 3.8 09/02/2018 1018   VLDL 15 05/04/2017 1339   LDLCALC 133 (H) 09/02/2018 1018      Wt Readings from Last 3 Encounters:  12/15/18 165 lb 9.6 oz (75.1 kg)  11/30/18 165 lb 3.2 oz (74.9 kg)  11/24/18 165 lb 9.6 oz (75.1 kg)      Other studies Reviewed: Additional studies/ records that were reviewed today include:   Echocardiogram 11/2018: Study Conclusions  - Procedure narrative: Transthoracic echocardiography. Image   quality was adequate. The study was technically difficult. - Left ventricle: The cavity size was normal. Wall  thickness was   increased in a pattern of mild LVH. Systolic function was normal.   The estimated ejection fraction was in the range of 60% to 65%.   Wall motion was normal; there were no regional wall motion   abnormalities. The study was not technically sufficient to allow   evaluation of LV diastolic dysfunction due to atrial   fibrillation. - Aortic valve: There was trivial regurgitation directed   eccentrically in the LVOT and towards the mitral anterior   leaflet. - Left atrium: The atrium was mildly dilated. - Pulmonic valve: There was trivial regurgitation. - Pericardium, extracardiac: A trivial, free-flowing pericardial   effusion was identified circumferential to the heart.    ASSESSMENT AND PLAN:  1. Atrial fibrillation: recent diagnosis with unclear onset, though she has been feeling increased DOE and fatigue since discharge from the hospital. Echo 11/22/2018 with EF 60-65% and mildly dilated LA. EKG today with persistent atrial fibrillation, rate 96bpm. Risks/benefits of DCCV discussed with the patient who is interested in pursuing this in an effort to return her to normal rhythm. - Will plan for DCCV 12/20/2018 with Dr. Harrell Gave - Will add diltiazem 120mg  daily for better HR  control - Continue atenolol  - Continue eliquis 5mg  BID - importance of ongoing compliance stressed.   2. Chronic diastolic CHF: BNP and CXR suggested acute CHF during recent hospitalization. She was diuresed with IV lasix and discharged on home bumex regimen. She is still experiencing DOE but no orthopnea, PND, or LE edema. She appears euvolemic on exam. Weight log since discharge from the hospital has been stable.  - Continue bumex - Low sodium diet encouraged, as well as continued monitoring of daily weights.   3. HTN: BP elevated to 142/90 today - Will add diltiazem 120mg  daily for better HR/HTN control - Continue atenolol and bumex - Encouraged DASH diet and continued exercise to promote weight loss  4. HLD: LDL 133 08/2018. Not on statin therapy - Dietary modifications encouraged  - Consider statin therapy if persistently elevated.   Current medicines are reviewed at length with the patient today.  The patient does not have concerns regarding medicines.  The following changes have been made:  Start diltiazem 120mg  daily. Plan for DCCV 12/20/2018 with Dr. Harrell Gave  Labs/ tests ordered today include:   Orders Placed This Encounter  Procedures  . ELECTRICAL CARDIOVERSION  . CBC  . Basic metabolic panel  . EKG 12-Lead     Disposition:   FU with Dr. Senaida Ores in 3-4 weeks after DCCV.   Signed, Abigail Butts, PA-C  12/15/2018 2:44 PM

## 2018-12-15 NOTE — Patient Instructions (Addendum)
Medication Instructions:  START diltiazem 120mg  daily If you need a refill on your cardiac medications before your next appointment, please call your pharmacy.   Lab work: BMET, CBC today - pre-procedure labs If you have labs (blood work) drawn today and your tests are completely normal, you will receive your results only by: Marland Kitchen MyChart Message (if you have MyChart) OR . A paper copy in the mail If you have any lab test that is abnormal or we need to change your treatment, we will call you to review the results.  Testing/Procedures: Daleen Snook PA has recommended that you have a cardioversion. This is a procedure done at [facility]. Instructions noted below.   Electrical Cardioversion  Electrical cardioversion is the delivery of a jolt of electricity to restore a normal rhythm to the heart. A rhythm that is too fast or is not regular keeps the heart from pumping well. In this procedure, sticky patches or metal paddles are placed on the chest to deliver electricity to the heart from a device. This procedure may be done in an emergency if:  There is low or no blood pressure as a result of the heart rhythm.  Normal rhythm must be restored as fast as possible to protect the brain and heart from further damage.  It may save a life. This procedure may also be done for irregular or fast heart rhythms that are not immediately life-threatening. Tell a health care provider about:  Any allergies you have.  All medicines you are taking, including vitamins, herbs, eye drops, creams, and over-the-counter medicines.  Any problems you or family members have had with anesthetic medicines.  Any blood disorders you have.  Any surgeries you have had.  Any medical conditions you have.  Whether you are pregnant or may be pregnant. What are the risks? Generally, this is a safe procedure. However, problems may occur, including:  Allergic reactions to medicines.  A blood clot that breaks free and  travels to other parts of your body.  The possible return of an abnormal heart rhythm within hours or days after the procedure.  Your heart stopping (cardiac arrest). This is rare. What happens before the procedure? Medicines  Your health care provider may have you start taking: ? Blood-thinning medicines (anticoagulants) so your blood does not clot as easily. ? Medicines may be given to help stabilize your heart rate and rhythm.  Ask your health care provider about changing or stopping your regular medicines. This is especially important if you are taking diabetes medicines or blood thinners. General instructions  Plan to have someone take you home from the hospital or clinic.  If you will be going home right after the procedure, plan to have someone with you for 24 hours.  Follow instructions from your health care provider about eating or drinking restrictions. What happens during the procedure?  To lower your risk of infection: ? Your health care team will wash or sanitize their hands. ? Your skin will be washed with soap.  An IV tube will be inserted into one of your veins.  You will be given a medicine to help you relax (sedative).  Sticky patches (electrodes) or metal paddles may be placed on your chest.  An electrical shock will be delivered. The procedure may vary among health care providers and hospitals. What happens after the procedure?   Your blood pressure, heart rate, breathing rate, and blood oxygen level will be monitored until the medicines you were given have worn off.  Do not drive for 24 hours if you were given a sedative.  Your heart rhythm will be watched to make sure it does not change. This information is not intended to replace advice given to you by your health care provider. Make sure you discuss any questions you have with your health care provider. Document Released: 10/23/2002 Document Revised: 07/01/2016 Document Reviewed:  05/08/2016 Elsevier Interactive Patient Education  2019 [employer].   Follow-Up: At [facility], you and your health needs are our priority.  As part of our continuing mission to provide you with exceptional heart care, we have created designated Provider Care Teams.  These Care Teams include your primary Cardiologist (physician) and Advanced Practice Providers (APPs -  Physician Assistants and Nurse Practitioners) who all work together to provide you with the care you need, when you need it. You will need a follow up appointment in 3-4 weeks after your cardioversion procedure. You may see Shelva Majestic, MD or one of the following Advanced Practice Providers on your designated Care Team: [place], [place] . Fabian Sharp, PA-C  Any Other Special Instructions Will Be Listed Below (If Applicable).   You are scheduled for a Cardioversion on Tuesday Dec 20, 2018 with Dr. Harrell Gave.  Please arrive at the [facility] (Main Entrance A) at [facility]: [address] [place], [place] [address] at 1 pm. (1 hour prior to procedure unless lab work is needed)  DIET: nothing to eat 8 hours prior to procedure, nothing to drink 6 hours prior to procedure  Medication Instructions:  Continue your anticoagulant: Eliquis You will need to continue your anticoagulant after your procedure until you are told by your  provider that it is safe to stop   Labs: BMET & CBC today  You must have a responsible person to drive you home and stay in the waiting area during your procedure. Failure to do so could result in cancellation.  Bring your insurance cards.  *Special Note: Every effort is made to have your procedure done on time. Occasionally there are emergencies that occur at the hospital that may cause delays. Please be patient if a delay does occur.

## 2018-12-16 ENCOUNTER — Other Ambulatory Visit: Payer: Self-pay | Admitting: Internal Medicine

## 2018-12-16 LAB — BASIC METABOLIC PANEL
BUN/Creatinine Ratio: 20 (ref 12–28)
BUN: 19 mg/dL (ref 8–27)
CO2: 23 mmol/L (ref 20–29)
Calcium: 9.8 mg/dL (ref 8.7–10.3)
Chloride: 100 mmol/L (ref 96–106)
Creatinine, Ser: 0.96 mg/dL (ref 0.57–1.00)
GFR calc Af Amer: 63 mL/min/{1.73_m2} (ref >59)
GFR calc non Af Amer: 55 mL/min/{1.73_m2} — ABNORMAL LOW (ref >59)
Glucose: 111 mg/dL — ABNORMAL HIGH (ref 65–99)
Potassium: 4.5 mmol/L (ref 3.5–5.2)
Sodium: 143 mmol/L (ref 134–144)

## 2018-12-16 LAB — CBC
Hematocrit: 41.3 % (ref 34.0–46.6)
Hemoglobin: 13.4 g/dL (ref 11.1–15.9)
MCH: 29.6 pg (ref 26.6–33.0)
MCHC: 32.4 g/dL (ref 31.5–35.7)
MCV: 91 fL (ref 79–97)
Platelets: 171 10*3/uL (ref 150–450)
RBC: 4.53 x10E6/uL (ref 3.77–5.28)
RDW: 13.3 % (ref 11.7–15.4)
WBC: 5.6 10*3/uL (ref 3.4–10.8)

## 2018-12-20 ENCOUNTER — Encounter (HOSPITAL_COMMUNITY)
Admission: RE | Disposition: A | Discharge status: Home / Self Care | Payer: Self-pay | Source: Home / Self Care | Attending: Cardiology

## 2018-12-20 ENCOUNTER — Ambulatory Visit (HOSPITAL_COMMUNITY): Payer: Medicare Other | Admitting: Anesthesiology

## 2018-12-20 ENCOUNTER — Other Ambulatory Visit: Payer: Self-pay

## 2018-12-20 ENCOUNTER — Ambulatory Visit (HOSPITAL_COMMUNITY)
Admission: RE | Admit: 2018-12-20 | Discharge: 2018-12-20 | Disposition: A | Discharge status: Home / Self Care | Payer: Medicare Other | Attending: Cardiology | Admitting: Cardiology

## 2018-12-20 ENCOUNTER — Encounter (HOSPITAL_COMMUNITY): Payer: Self-pay | Admitting: Cardiology

## 2018-12-20 DIAGNOSIS — E1122 Type 2 diabetes mellitus with diabetic chronic kidney disease: Secondary | ICD-10-CM | POA: Diagnosis not present

## 2018-12-20 DIAGNOSIS — I130 Hypertensive heart and chronic kidney disease with heart failure and stage 1 through stage 4 chronic kidney disease, or unspecified chronic kidney disease: Secondary | ICD-10-CM | POA: Diagnosis not present

## 2018-12-20 DIAGNOSIS — E785 Hyperlipidemia, unspecified: Secondary | ICD-10-CM | POA: Diagnosis not present

## 2018-12-20 DIAGNOSIS — I2510 Atherosclerotic heart disease of native coronary artery without angina pectoris: Secondary | ICD-10-CM | POA: Insufficient documentation

## 2018-12-20 DIAGNOSIS — E559 Vitamin D deficiency, unspecified: Secondary | ICD-10-CM | POA: Diagnosis not present

## 2018-12-20 DIAGNOSIS — I5033 Acute on chronic diastolic (congestive) heart failure: Secondary | ICD-10-CM | POA: Diagnosis not present

## 2018-12-20 DIAGNOSIS — Z888 Allergy status to other drugs, medicaments and biological substances status: Secondary | ICD-10-CM | POA: Insufficient documentation

## 2018-12-20 DIAGNOSIS — K589 Irritable bowel syndrome without diarrhea: Secondary | ICD-10-CM | POA: Insufficient documentation

## 2018-12-20 DIAGNOSIS — I700 Atherosclerosis of aorta: Secondary | ICD-10-CM | POA: Insufficient documentation

## 2018-12-20 DIAGNOSIS — Z7982 Long term (current) use of aspirin: Secondary | ICD-10-CM | POA: Insufficient documentation

## 2018-12-20 DIAGNOSIS — I4891 Unspecified atrial fibrillation: Secondary | ICD-10-CM | POA: Diagnosis not present

## 2018-12-20 DIAGNOSIS — J8410 Pulmonary fibrosis, unspecified: Secondary | ICD-10-CM | POA: Insufficient documentation

## 2018-12-20 DIAGNOSIS — Z7901 Long term (current) use of anticoagulants: Secondary | ICD-10-CM | POA: Insufficient documentation

## 2018-12-20 DIAGNOSIS — K449 Diaphragmatic hernia without obstruction or gangrene: Secondary | ICD-10-CM | POA: Insufficient documentation

## 2018-12-20 DIAGNOSIS — Z85828 Personal history of other malignant neoplasm of skin: Secondary | ICD-10-CM | POA: Insufficient documentation

## 2018-12-20 DIAGNOSIS — N183 Chronic kidney disease, stage 3 (moderate): Secondary | ICD-10-CM | POA: Diagnosis not present

## 2018-12-20 DIAGNOSIS — Z79899 Other long term (current) drug therapy: Secondary | ICD-10-CM | POA: Diagnosis not present

## 2018-12-20 DIAGNOSIS — M1990 Unspecified osteoarthritis, unspecified site: Secondary | ICD-10-CM | POA: Diagnosis not present

## 2018-12-20 DIAGNOSIS — G250 Essential tremor: Secondary | ICD-10-CM | POA: Diagnosis not present

## 2018-12-20 DIAGNOSIS — K219 Gastro-esophageal reflux disease without esophagitis: Secondary | ICD-10-CM | POA: Diagnosis not present

## 2018-12-20 HISTORY — PX: CARDIOVERSION: SHX1299

## 2018-12-20 SURGERY — CARDIOVERSION
Anesthesia: General

## 2018-12-20 MED ORDER — SODIUM CHLORIDE 0.9 % IV SOLN
INTRAVENOUS | Status: DC | PRN
  Administered 2018-12-20: 14:00:00 via INTRAVENOUS

## 2018-12-20 MED ORDER — PROPOFOL 10 MG/ML IV BOLUS
INTRAVENOUS | Status: DC | PRN
  Administered 2018-12-20: 70 mg via INTRAVENOUS

## 2018-12-20 MED ORDER — LIDOCAINE 2% (20 MG/ML) 5 ML SYRINGE
INTRAMUSCULAR | Status: DC | PRN
  Administered 2018-12-20: 60 mg via INTRAVENOUS

## 2018-12-20 NOTE — Anesthesia Postprocedure Evaluation (Signed)
Anesthesia Post Note  Patient: Briana [name]  Procedure(s) Performed: CARDIOVERSION (N/A )     Patient location during evaluation: PACU Anesthesia Type: General Level of consciousness: awake and alert Pain management: pain level controlled Vital Signs Assessment: post-procedure vital signs reviewed and stable Respiratory status: spontaneous breathing, nonlabored ventilation and respiratory function stable Cardiovascular status: blood pressure returned to baseline and stable Postop Assessment: no apparent nausea or vomiting Anesthetic complications: no    Last Vitals:  Vitals:   12/20/18 1400 12/20/18 1410  BP: 127/60 123/67  Pulse: (!) 51 (!) 53  Resp: (!) 21 14  Temp: 36.7 C   SpO2: 98% 94%    Last Pain:  Vitals:   12/20/18 1410  TempSrc:   PainSc: 0-No pain                 Audry Pili

## 2018-12-20 NOTE — Anesthesia Procedure Notes (Signed)
Procedure Name: General with mask airway Date/Time: 12/20/2018 1:58 PM Performed by: Lowella Dell, CRNA Pre-anesthesia Checklist: Patient identified, Emergency Drugs available, Suction available, Patient being monitored and Timeout performed Patient Re-evaluated:Patient Re-evaluated prior to induction Oxygen Delivery Method: Ambu bag Preoxygenation: Pre-oxygenation with 100% oxygen Induction Type: IV induction Placement Confirmation: positive ETCO2 Dental Injury: Teeth and Oropharynx as per pre-operative assessment

## 2018-12-20 NOTE — CV Procedure (Signed)
Procedure:   DCCV  Indication:  Symptomatic atrial fibrillation  Procedure Note:  The patient signed informed consent.  She has had had therapeutic anticoagulation with apixaban greater than 3 weeks.  Anesthesia was administered by Dr. Fransisco Beau.  Adequate airway was maintained throughout and vital followed per protocol.  She was cardioverted x 1 with 120 J of biphasic synchronized energy.  She converted to NSR.  There were no apparent complications.  The patient had normal neuro status and respiratory status post procedure with vitals stable as recorded elsewhere.    Follow up:  Continue current medical therapy, follow up with cardiology as scheduled.  Buford Dresser, MD PhD 12/20/2018 1:56 PM

## 2018-12-20 NOTE — Interval H&P Note (Signed)
History and Physical Interval Note:  12/20/2018 1:44 PM  Briana [name]  has presented today for surgery, with the diagnosis of afib  The various methods of treatment have been discussed with the patient and family. After consideration of risks, benefits and other options for treatment, the patient has consented to  Procedure(s): CARDIOVERSION (N/A) as a surgical intervention .  The patient's history has been reviewed, patient examined, no change in status, stable for surgery.  I have reviewed the patient's chart and labs.  Questions were answered to the patient's satisfaction.     Bridgette Harrell Gave

## 2018-12-20 NOTE — Discharge Instructions (Signed)
Electrical Cardioversion, Care After °This sheet gives you information about how to care for yourself after your procedure. Your health care provider may also give you more specific instructions. If you have problems or questions, contact your health care provider. °What can I expect after the procedure? °After the procedure, it is common to have: °· Some redness on the skin where the shocks were given. °Follow these instructions at home: ° °· Do not drive for 24 hours if you were given a medicine to help you relax (sedative). °· Take over-the-counter and prescription medicines only as told by your health care provider. °· Ask your health care provider how to check your pulse. Check it often. °· Rest for 48 hours after the procedure or as told by your health care provider. °· Avoid or limit your caffeine use as told by your health care provider. °Contact a health care provider if: °· You feel like your heart is beating too quickly or your pulse is not regular. °· You have a serious muscle cramp that does not go away. °Get help right away if: ° °· You have discomfort in your chest. °· You are dizzy or you feel faint. °· You have trouble breathing or you are short of breath. °· Your speech is slurred. °· You have trouble moving an arm or leg on one side of your body. °· Your fingers or toes turn cold or blue. °This information is not intended to replace advice given to you by your health care provider. Make sure you discuss any questions you have with your health care provider. °Document Released: 08/23/2013 Document Revised: 06/05/2016 Document Reviewed: 05/08/2016 °Elsevier Interactive Patient Education © 2019 [employer] ° °

## 2018-12-20 NOTE — Anesthesia Preprocedure Evaluation (Addendum)
Anesthesia Evaluation  Patient identified by MRN, date of birth, ID band Patient awake    Reviewed: Allergy & Precautions, NPO status , Patient's Chart, lab work & pertinent test results  History of Anesthesia Complications Negative for: history of anesthetic complications  Airway Mallampati: II  TM Distance: >3 FB Neck ROM: Full    Dental  (+) Dental Advisory Given   Pulmonary neg pulmonary ROS,    breath sounds clear to auscultation       Cardiovascular hypertension, Pt. on home beta blockers and Pt. on medications +CHF  + dysrhythmias Atrial Fibrillation  Rhythm:Irregular Rate:Tachycardia   '20 TTE - Mild LVH. EF 60% to 65%. Trivial AI. LA was mildly dilated. Trivial PR. A trivial, free-flowing pericardial effusion was identified circumferential to the heart.    Neuro/Psych negative neurological ROS  negative psych ROS   GI/Hepatic Neg liver ROS, hiatal hernia, GERD  Medicated and Controlled, IBS    Endo/Other   Obesity Pre-DM   Renal/GU CRFRenal disease     Musculoskeletal  (+) Arthritis ,   Abdominal   Peds  Hematology negative hematology ROS (+)   Anesthesia Other Findings   Reproductive/Obstetrics                            Anesthesia Physical Anesthesia Plan  ASA: III  Anesthesia Plan: General   Post-op Pain Management:    Induction: Intravenous  PONV Risk Score and Plan: Treatment may vary due to age or medical condition and Propofol infusion  Airway Management Planned: Mask and Natural Airway  Additional Equipment: None  Intra-op Plan:   Post-operative Plan:   Informed Consent: I have reviewed the patients History and Physical, chart, labs and discussed the procedure including the risks, benefits and alternatives for the proposed anesthesia with the patient or authorized representative who has indicated his/her understanding and acceptance.       Plan  Discussed with: CRNA and Anesthesiologist  Anesthesia Plan Comments:        Anesthesia Quick Evaluation

## 2018-12-20 NOTE — Transfer of Care (Signed)
Immediate Anesthesia Transfer of Care Note  Patient: Briana [name]  Procedure(s) Performed: CARDIOVERSION (N/A )  Patient Location: Endoscopy Unit  Anesthesia Type:General  Level of Consciousness: drowsy and patient cooperative  Airway & Oxygen Therapy: Patient Spontanous Breathing and Patient connected to nasal cannula oxygen  Post-op Assessment: Report given to RN and Post -op Vital signs reviewed and stable  Post vital signs: Reviewed and stable  Last Vitals:  Vitals Value Taken Time  BP    Temp    Pulse    Resp    SpO2      Last Pain:  Vitals:   12/20/18 1333  TempSrc: Oral         Complications: No apparent anesthesia complications

## 2018-12-21 ENCOUNTER — Other Ambulatory Visit: Payer: Self-pay | Admitting: Internal Medicine

## 2018-12-21 DIAGNOSIS — Z1231 Encounter for screening mammogram for malignant neoplasm of breast: Secondary | ICD-10-CM

## 2018-12-28 ENCOUNTER — Encounter: Payer: Self-pay | Admitting: Internal Medicine

## 2018-12-28 NOTE — Progress Notes (Signed)
[facility] Unk Pinto, M.D.     [name]. Silverio Lay, P.A.-C [name], [place] [address] [place], [place] [address] Telephone [phone] Telefax [phone] Annual Screening/Preventative Visit & Comprehensive Evaluation &  Examination     This very nice 85 y.o. WWF presents for a Screening /Preventative Visit & comprehensive evaluation and management of multiple medical co-morbidities.  Patient has been followed for HTN, HLD, T2_NIDDM  and Vitamin D Deficiency.      HTN predates circa 1996.  Patient was recently dx'd with cAfib on Nov 21, 2018 and started on Eliquis. She also was dx'd with mild diastolic CHF and diuresed in the hospital.   Then on 02.04.2020 she had successful CV by Dr Neva Seat.  Today's EKG does show Afib. Patient's BP has been controlled at home and patient denies any cardiac symptoms as chest pain, palpitations, shortness of breath, dizziness or ankle swelling. Today's BP is at goal - 132/74.      Patient's hyperlipidemia is controlled with diet and medications. Patient denies myalgias or other medication SE's. Last lipids were not at goal: Lab Results  Component Value Date   CHOL 207 (H) 09/02/2018   HDL 54 09/02/2018   LDLCALC 133 (H) 09/02/2018   TRIG 92 09/02/2018   CHOLHDL 3.8 09/02/2018      Patient has Obesity (BMI 30+) and hx/o T2_NIDDM (A1c 6.6% / 2011) attempting control w/diet. Patient denies reactive hypoglycemic symptoms, visual blurring, diabetic polys or paresthesias. Last A1c was not at goal: Lab Results  Component Value Date   HGBA1C 6.2 (H) 11/21/2018      Finally, patient has history of Vitamin D Deficiency (23" / 2008)and last Vitamin D was at goal: Lab Results  Component Value Date   VD25OH 71 09/02/2018   Current Outpatient Medications on File Prior to Visit  Medication Sig  . acetaminophen (TYLENOL) 500 MG tablet Take 500-1,000 mg by  mouth every 6 (six) hours as needed for moderate pain.  Marland Kitchen albuterol (PROVENTIL HFA;VENTOLIN HFA) 108 (90 Base) MCG/ACT inhaler Use 2 inhalations 15-20 minutes apart every 4 hours as needed to rescue Asthma (Patient taking differently: Inhale 2 puffs into the lungs every 4 (four) hours as needed for wheezing. )  . apixaban (ELIQUIS) 5 MG TABS tablet Take 1 tablet (5 mg total) by mouth 2 (two) times daily.  . Ascorbic Acid (VITAMIN C) 1000 MG tablet Take 1,000 mg by mouth daily.   Marland Kitchen aspirin EC 81 MG tablet Take 81 mg by mouth daily.  Marland Kitchen atenolol (TENORMIN) 100 MG tablet Take 1 tablet every morning for BP & Tremor (Patient taking differently: Take 100 mg by mouth daily. )  . bumetanide (BUMEX) 1 MG tablet Take 1/2 to 1 tablet 2 x /day if needed for ankle swelling  . cholecalciferol (VITAMIN D3) 25 MCG (1000 UT) tablet Take 1,000 Units by mouth 2 (two) times daily.  Marland Kitchen diltiazem (CARDIZEM CD) 120 MG 24 hr capsule Take 1 capsule (120 mg total) by mouth daily. (Patient taking differently: Take 120 mg by mouth every evening. )  . fluticasone (FLONASE) 50 MCG/ACT nasal spray Place 2 sprays into both nostrils daily. (Patient taking differently: Place 2 sprays into both nostrils daily as needed for allergies. )  . Melatonin 10 MG CAPS Take 10-20 mg by mouth at bedtime as needed (sleep).  . Misc Natural Products (OSTEO BI-FLEX JOINT SHIELD PO) Take 1 tablet by mouth 2 (two) times daily.   Marland Kitchen  Multiple Vitamins-Minerals (PRESERVISION AREDS 2) CAPS Take 1 capsule by mouth 2 (two) times daily.  . Naphazoline-Pheniramine (OPCON-A) 0.027-0.315 % SOLN Place 1 drop into both eyes daily as needed (allergies).  . vitamin B-12 (CYANOCOBALAMIN) 500 MCG tablet Take 500 mcg by mouth daily.   No current facility-administered medications on file prior to visit.    Allergies  Allergen Reactions  . Accupril [Quinapril Hcl] Cough  . Neosporin [Neomycin-Bacitracin Zn-Polymyx]     unknown  . Prednisone     Agitated and shaky   . Reglan [Metoclopramide] Other (See Comments)    tremors  . Tramadol Nausea And Vomiting  . Adhesive [Tape] Rash   Past Medical History:  Diagnosis Date  . Cancer ([place])    skin cancer  . DDD (degenerative disc disease)   . Diverticulitis   . DJD (degenerative joint disease)   . GERD (gastroesophageal reflux disease)    takes ranitidine  . Heart murmur   . History of hiatal hernia   . History of pneumonia   . Hx of irritable bowel syndrome   . Hyperlipidemia   . Occasional tremors   . Osteoporosis   . Pre-diabetes   . Varicose veins   . Vitamin D deficiency    Health Maintenance  Topic Date Due  . URINE MICROALBUMIN  11/18/2018  . INFLUENZA VACCINE  08/17/2019 (Originally 06/16/2018)  . TETANUS/TDAP  09/04/2025  . DEXA SCAN  Completed  . PNA vac Low Risk Adult  Completed   Immunization History  Administered Date(s) Administered  . DT 09/05/2015  . DTaP 11/16/2004  . Influenza Whole 08/30/2013  . Influenza, High Dose Seasonal PF 08/28/2015, 08/10/2016  . Pneumococcal Conjugate-13 10/19/2016  . Pneumococcal Polysaccharide-23 11/16/2001  . Zoster 06/01/2013   Last Colon - 01/21/2010 - Dr Fuller Plan - recc 10 yr f/u   Last MGM - scheduled - 01/25/2019  Past Surgical History:  Procedure Laterality Date  . APPENDECTOMY    . BREAST SURGERY Right    fluid drained from breast  . CARDIOVERSION N/A 12/20/2018   Procedure: CARDIOVERSION;  Surgeon: Buford Dresser, MD;  Location: [facility];  Service: Cardiovascular;  Laterality: N/A;  . COLONOSCOPY    . ESOPHAGOGASTRODUODENOSCOPY    . JOINT REPLACEMENT Left    left hip  . LUMBAR LAMINECTOMY/DECOMPRESSION MICRODISCECTOMY N/A 06/05/2015   Procedure: L3-L5 DECOMPRESSION ;  Surgeon: Melina Schools, MD;  Location: [facility];  Service: Orthopedics;  Laterality: N/A;  . OPEN REDUCTION INTERNAL FIXATION (ORIF) DISTAL RADIAL FRACTURE Right 01/10/2014   Procedure: OPEN REDUCTION INTERNAL FIXATION (ORIF) DISTAL RADIAL FRACTURE;   Surgeon: Linna Hoff, MD;  Location: [facility];  Service: Orthopedics;  Laterality: Right;  . SPINAL CORD DECOMPRESSION  06/05/2015   L3 L 5  . THYROID SURGERY    . TONSILLECTOMY    . TUBAL LIGATION    . varicose veins stripped     Family History  Problem Relation Age of Onset  . Hypertension Mother   . Hyperlipidemia Mother   . Heart disease Sister   . Cancer Brother        prostate   Social History   Tobacco Use  . Smoking status: Never Smoker  . Smokeless tobacco: Never Used  Substance Use Topics  . Alcohol use: No  . Drug use: No    ROS Constitutional: Denies fever, chills, weight loss/gain, headaches, insomnia,  night sweats, and change in appetite. Does c/o fatigue. Eyes: Denies redness, blurred vision, diplopia, discharge, itchy, watery eyes.  ENT:  Denies discharge, congestion, post nasal drip, epistaxis, sore throat, earache, hearing loss, dental pain, Tinnitus, Vertigo, Sinus pain, snoring.  Cardio: Denies chest pain, palpitations, irregular heartbeat, syncope, dyspnea, diaphoresis, orthopnea, PND, claudication, edema Respiratory: denies cough, dyspnea, DOE, pleurisy, hoarseness, laryngitis, wheezing.  Gastrointestinal: Denies dysphagia, heartburn, reflux, water brash, pain, cramps, nausea, vomiting, bloating, diarrhea, constipation, hematemesis, melena, hematochezia, jaundice, hemorrhoids Genitourinary: Denies dysuria, frequency, urgency, nocturia, hesitancy, discharge, hematuria, flank pain Breast: Breast lumps, nipple discharge, bleeding.  Musculoskeletal: Denies arthralgia, myalgia, stiffness, Jt. Swelling, pain, limp, and strain/sprain. Denies falls. Skin: Denies puritis, rash, hives, warts, acne, eczema, changing in skin lesion Neuro: No weakness, tremor, incoordination, spasms, paresthesia, pain Psychiatric: Denies confusion, memory loss, sensory loss. Denies Depression. Endocrine: Denies change in weight, skin, hair change, nocturia, and paresthesia, diabetic  polys, visual blurring, hyper / hypo glycemic episodes.  Heme/Lymph: No excessive bleeding, bruising, enlarged lymph nodes.  Physical Exam  BP 132/74   Pulse 87   Temp 97.8 F (36.6 C)   Ht 5' 1.5" (1.562 m)   Wt 161 lb 3.2 oz (73.1 kg)   SpO2 96%   BMI 29.97 kg/m   General Appearance: Well nourished, well groomed and in no apparent distress.  Eyes: PERRLA, EOMs, conjunctiva no swelling or erythema, normal fundi and vessels. Sinuses: No frontal/maxillary tenderness ENT/Mouth: EACs patent / TMs  nl. Nares clear without erythema, swelling, mucoid exudates. Oral hygiene is good. No erythema, swelling, or exudate. Tongue normal, non-obstructing. Tonsils not swollen or erythematous. Hearing normal.  Neck: Supple, thyroid not palpable. No bruits, nodes or JVD. Respiratory: Respiratory effort normal.  BS equal and clear bilateral without rales, rhonci, wheezing or stridor. Cardio: Heart sounds are normal with regular rate and rhythm and no murmurs, rubs or gallops. Peripheral pulses are normal and equal bilaterally without edema. No aortic or femoral bruits. Chest: symmetric with normal excursions and percussion. Breasts: Symmetric, without lumps, nipple discharge, retractions, or fibrocystic changes.  Abdomen: Flat, soft with bowel sounds active. Nontender, no guarding, rebound, hernias, masses, or organomegaly.  Lymphatics: Non tender without lymphadenopathy.  Genitourinary:  Musculoskeletal: Full ROM all peripheral extremities, joint stability, 5/5 strength, and normal gait. Skin: Warm and dry without rashes, lesions, cyanosis, clubbing or  ecchymosis.  Neuro: Cranial nerves intact, reflexes equal bilaterally. Normal muscle tone, no cerebellar symptoms. Sensation intact.  Pysch: Alert and oriented X 3, normal affect, Insight and Judgment appropriate.   Assessment and Plan  1. Annual Preventative Screening Examination  2. Essential hypertension  - EKG 12-Lead - Urinalysis, Routine  w reflex microscopic - Microalbumin / creatinine urine ratio - CBC with Differential/Platelet - COMPLETE METABOLIC PANEL WITH GFR - Magnesium - TSH  3. Hyperlipidemia, mixed  - EKG 12-Lead - Lipid panel - TSH  4. Type 2 diabetes mellitus with stage 3 chronic kidney disease, without long-term current use of insulin (HCC)  - EKG 12-Lead - HM DIABETES FOOT EXAM - LOW EXTREMITY NEUR EXAM DOCUM - Hemoglobin A1c - Insulin, random  5. Vitamin D deficiency  - VITAMIN D 25 Hydroxyl  6. Atrial fibrillation, persistent (Relapse post CV)  - EKG 12-Lead - TSH  7. Chronic diastolic (congestive) heart failure (HCC)  - EKG 12-Lead  8. CKD (chronic kidney disease) stage 3, GFR 30-59 ml/min (HCC)  - Urinalysis, Routine w reflex microscopic - Microalbumin / creatinine urine ratio  9. GERD  - CBC with Differential/Platelet  10. Age-related osteoporosis without current pathological fracture   11. Screening for colorectal cancer  - POC Hemoccult Bld/Stl (  3-Cd Home Screen); Future  12. Screening for ischemic heart disease  - EKG 12-Lead  13. FHx: heart disease  - EKG 12-Lead  14. Medication management  - Urinalysis, Routine w reflex microscopic - Microalbumin / creatinine urine ratio - CBC with Differential/Platelet - COMPLETE METABOLIC PANEL WITH GFR - Hemoglobin A1c - Insulin, random - VITAMIN D 25 Hydroxyl - Magnesium - Lipid panel - TSH     Patient was counseled in prudent diet to achieve/maintain BMI less than 25 for weight control, BP monitoring, regular exercise and medications. Discussed med's effects and SE's. Screening labs and tests as requested with regular follow-up as recommended. Over 40 minutes of exam, counseling, chart review and high complex critical decision making was performed.

## 2018-12-28 NOTE — Patient Instructions (Signed)

## 2018-12-29 ENCOUNTER — Encounter: Payer: Self-pay | Admitting: Internal Medicine

## 2018-12-29 ENCOUNTER — Ambulatory Visit (INDEPENDENT_AMBULATORY_CARE_PROVIDER_SITE_OTHER): Payer: Medicare Other | Admitting: Internal Medicine

## 2018-12-29 VITALS — BP 132/74 | HR 87 | Temp 97.8°F | Ht 61.5 in | Wt 161.2 lb

## 2018-12-29 DIAGNOSIS — K2100 Gastro-esophageal reflux disease with esophagitis, without bleeding: Secondary | ICD-10-CM

## 2018-12-29 DIAGNOSIS — Z1211 Encounter for screening for malignant neoplasm of colon: Secondary | ICD-10-CM

## 2018-12-29 DIAGNOSIS — E559 Vitamin D deficiency, unspecified: Secondary | ICD-10-CM

## 2018-12-29 DIAGNOSIS — R7303 Prediabetes: Secondary | ICD-10-CM

## 2018-12-29 DIAGNOSIS — I4819 Other persistent atrial fibrillation: Secondary | ICD-10-CM

## 2018-12-29 DIAGNOSIS — E782 Mixed hyperlipidemia: Secondary | ICD-10-CM

## 2018-12-29 DIAGNOSIS — Z79899 Other long term (current) drug therapy: Secondary | ICD-10-CM

## 2018-12-29 DIAGNOSIS — N183 Chronic kidney disease, stage 3 unspecified: Secondary | ICD-10-CM

## 2018-12-29 DIAGNOSIS — I5032 Chronic diastolic (congestive) heart failure: Secondary | ICD-10-CM

## 2018-12-29 DIAGNOSIS — E1122 Type 2 diabetes mellitus with diabetic chronic kidney disease: Secondary | ICD-10-CM | POA: Diagnosis not present

## 2018-12-29 DIAGNOSIS — Z136 Encounter for screening for cardiovascular disorders: Secondary | ICD-10-CM

## 2018-12-29 DIAGNOSIS — Z0001 Encounter for general adult medical examination with abnormal findings: Secondary | ICD-10-CM

## 2018-12-29 DIAGNOSIS — I10 Essential (primary) hypertension: Secondary | ICD-10-CM

## 2018-12-29 DIAGNOSIS — Z1212 Encounter for screening for malignant neoplasm of rectum: Secondary | ICD-10-CM

## 2018-12-29 DIAGNOSIS — M810 Age-related osteoporosis without current pathological fracture: Secondary | ICD-10-CM

## 2018-12-29 DIAGNOSIS — Z8249 Family history of ischemic heart disease and other diseases of the circulatory system: Secondary | ICD-10-CM

## 2018-12-29 DIAGNOSIS — Z0000 Encounter for general adult medical examination without abnormal findings: Secondary | ICD-10-CM | POA: Diagnosis not present

## 2018-12-30 LAB — COMPLETE METABOLIC PANEL WITH GFR
AG Ratio: 2.1 (calc) (ref 1.0–2.5)
ALT: 20 U/L (ref 6–29)
AST: 19 U/L (ref 10–35)
Albumin: 4.4 g/dL (ref 3.6–5.1)
Alkaline phosphatase (APISO): 90 U/L (ref 37–153)
BUN/Creatinine Ratio: 18 (calc) (ref 6–22)
BUN: 16 mg/dL (ref 7–25)
CO2: 28 mmol/L (ref 20–32)
Calcium: 10.1 mg/dL (ref 8.6–10.4)
Chloride: 106 mmol/L (ref 98–110)
Creat: 0.91 mg/dL — ABNORMAL HIGH (ref 0.60–0.88)
GFR, Est African American: 68 mL/min/{1.73_m2} (ref >=60)
GFR, Est Non African American: 58 mL/min/{1.73_m2} — ABNORMAL LOW (ref >=60)
Globulin: 2.1 g/dL (calc) (ref 1.9–3.7)
Glucose, Bld: 139 mg/dL — ABNORMAL HIGH (ref 65–99)
Potassium: 4.4 mmol/L (ref 3.5–5.3)
Sodium: 143 mmol/L (ref 135–146)
Total Bilirubin: 1 mg/dL (ref 0.2–1.2)
Total Protein: 6.5 g/dL (ref 6.1–8.1)

## 2018-12-30 LAB — CBC WITH DIFFERENTIAL/PLATELET
Absolute Monocytes: 562 cells/uL (ref 200–950)
Basophils Absolute: 38 cells/uL (ref 0–200)
Basophils Relative: 0.8 %
Eosinophils Absolute: 149 cells/uL (ref 15–500)
Eosinophils Relative: 3.1 %
HCT: 42.3 % (ref 35.0–45.0)
Hemoglobin: 14 g/dL (ref 11.7–15.5)
Lymphs Abs: 1157 cells/uL (ref 850–3900)
MCH: 30.2 pg (ref 27.0–33.0)
MCHC: 33.1 g/dL (ref 32.0–36.0)
MCV: 91.2 fL (ref 80.0–100.0)
MPV: 12.1 fL (ref 7.5–12.5)
Monocytes Relative: 11.7 %
Neutro Abs: 2894 cells/uL (ref 1500–7800)
Neutrophils Relative %: 60.3 %
Platelets: 181 10*3/uL (ref 140–400)
RBC: 4.64 10*6/uL (ref 3.80–5.10)
RDW: 13.3 % (ref 11.0–15.0)
Total Lymphocyte: 24.1 %
WBC: 4.8 10*3/uL (ref 3.8–10.8)

## 2018-12-30 LAB — URINALYSIS, ROUTINE W REFLEX MICROSCOPIC
Bilirubin Urine: NEGATIVE
Glucose, UA: NEGATIVE
Hgb urine dipstick: NEGATIVE
Ketones, ur: NEGATIVE
Nitrite: NEGATIVE
Protein, ur: NEGATIVE
Specific Gravity, Urine: 1.015 (ref 1.001–1.03)
pH: <=5 (ref 5.0–8.0)

## 2018-12-30 LAB — LIPID PANEL
Cholesterol: 178 mg/dL (ref <200)
HDL: 48 mg/dL — ABNORMAL LOW (ref >=50)
LDL Cholesterol (Calc): 111 mg/dL (calc) — ABNORMAL HIGH
Non-HDL Cholesterol (Calc): 130 mg/dL (calc) — ABNORMAL HIGH (ref <130)
Total CHOL/HDL Ratio: 3.7 (calc) (ref <5.0)
Triglycerides: 86 mg/dL (ref <150)

## 2018-12-30 LAB — MICROALBUMIN / CREATININE URINE RATIO
Creatinine, Urine: 107 mg/dL (ref 20–275)
Microalb Creat Ratio: 28 mcg/mg creat (ref <30)
Microalb, Ur: 3 mg/dL

## 2018-12-30 LAB — TSH: TSH: 0.45 mIU/L (ref 0.40–4.50)

## 2018-12-30 LAB — VITAMIN D 25 HYDROXY (VIT D DEFICIENCY, FRACTURES): Vit D, 25-Hydroxy: 65 ng/mL (ref 30–100)

## 2018-12-30 LAB — HEMOGLOBIN A1C
Hgb A1c MFr Bld: 6.4 % of total Hgb — ABNORMAL HIGH (ref <5.7)
Mean Plasma Glucose: 137 (calc)
eAG (mmol/L): 7.6 (calc)

## 2018-12-30 LAB — INSULIN, RANDOM: Insulin: 2.5 u[IU]/mL (ref 2.0–19.6)

## 2018-12-30 LAB — MAGNESIUM: Magnesium: 2.3 mg/dL (ref 1.5–2.5)

## 2019-01-01 ENCOUNTER — Encounter: Payer: Self-pay | Admitting: Internal Medicine

## 2019-01-10 ENCOUNTER — Encounter: Payer: Self-pay | Admitting: Physician Assistant

## 2019-01-10 ENCOUNTER — Ambulatory Visit: Payer: Medicare Other | Admitting: Physician Assistant

## 2019-01-10 VITALS — BP 140/88 | Temp 98.3°F | Ht 61.5 in | Wt 160.8 lb

## 2019-01-10 DIAGNOSIS — Z79899 Other long term (current) drug therapy: Secondary | ICD-10-CM

## 2019-01-10 DIAGNOSIS — E785 Hyperlipidemia, unspecified: Secondary | ICD-10-CM | POA: Diagnosis not present

## 2019-01-10 DIAGNOSIS — I5032 Chronic diastolic (congestive) heart failure: Secondary | ICD-10-CM | POA: Diagnosis not present

## 2019-01-10 DIAGNOSIS — I10 Essential (primary) hypertension: Secondary | ICD-10-CM

## 2019-01-10 DIAGNOSIS — I4819 Other persistent atrial fibrillation: Secondary | ICD-10-CM | POA: Diagnosis not present

## 2019-01-10 MED ORDER — AMIODARONE HCL 200 MG PO TABS: 200.0000 mg | ORAL_TABLET | Freq: Every day | ORAL | 3 refills | Status: DC

## 2019-01-10 MED ORDER — AMIODARONE HCL 400 MG PO TABS: 400.0000 mg | ORAL_TABLET | Freq: Two times a day (BID) | ORAL | 0 refills | Status: DC

## 2019-01-10 NOTE — Patient Instructions (Addendum)
Medication Instructions:   START AMIODARONE 400 MG 2 TIMES A DAY FOR 7 DAYS then TAKE 200 MG DAILY If you need a refill on your cardiac medications before your next appointment, please call your pharmacy.   Lab work: You will need to have labs drawn in 3-4 weeks prior to next appointment   TSH  LIVER FUNCTION  If you have labs (blood work) drawn today and your tests are completely normal, you will receive your results only by: Marland Kitchen MyChart Message (if you have MyChart) OR . A paper copy in the mail If you have any lab test that is abnormal or we need to change your treatment, we will call you to review the results.  Testing/Procedures: NONE   Follow-Up: . Your physician recommends that you follow-up AS SCHEDULED with Almyra Deforest, PA-C as scheduled .   Any Other Special Instructions Will Be Listed Below (If Applicable). Next appointment you will have an EKG performed if you are still in A-Fib a cardioversion will be set up for you

## 2019-01-10 NOTE — Progress Notes (Signed)
Cardiology Office Note    Date:  01/11/2019   ID:  Briana [name], Briana [name] 04-Sep-1934, MRN [id number]  PCP:  Unk Pinto, MD  Cardiologist:  Dr. Claiborne Billings  Chief Complaint  Patient presents with  . Follow-up    seen for Dr. Claiborne Billings.     History of Present Illness:  Briana [name] is a 85 y.o. female with PMH of chronic diastolic heart failure, PAF, HTN, HLD, varicose veins, and GERD. Last myoview obtained on 08/18/2017 showed EF 70%, no ischemia or infarction. Patient was diagnosed with new afib in Jan 2020. Echo on 11/22/2018 showed EF 60-65%. She was also in acute CHF. She underwent diuresis and discharged on home bumex. She was placed on eliquis for 3 days and underwent successful outpatient DCCV on 12/20/2018.   Patient presents to cardiology office visit along with her daughter.  Based on EKG findings, she has went back into atrial fibrillation.  In fact, based on office note by her primary care provider, she was already back in atrial fibrillation when she saw her PCP on 12/29/2018.  She does notice a little bit more shortness of breath while the atrial fibrillation than usual.  She denies any significant fatigue, palpitation or chest discomfort while in atrial fibrillation.  We discussed various options including rate control therapy versus rhythm control.  Since she is somewhat symptomatic atrial fibrillation, I wish to attempt rhythm control at this point.  I will place her on 400 mg twice daily of amiodarone for 1 week before decreasing to 200 mg daily thereafter.  I will bring her back in 3 weeks for reassessment, if she remains in atrial fibrillation at that time, I will set her up for another repeat DC cardioversion.  She will also need TSH and a liver function test on follow-up given the initiation of amiodarone.  Otherwise she appears to be euvolemic on physical exam.   Past Medical History:  Diagnosis Date  . Cancer ([facility])    skin cancer  . DDD (degenerative disc disease)   .  Diverticulitis   . DJD (degenerative joint disease)   . GERD (gastroesophageal reflux disease)    takes ranitidine  . Heart murmur   . History of hiatal hernia   . History of pneumonia   . Hx of irritable bowel syndrome   . Hyperlipidemia   . Occasional tremors   . Osteoporosis   . Pre-diabetes   . Varicose veins   . Vitamin D deficiency     Past Surgical History:  Procedure Laterality Date  . APPENDECTOMY    . BREAST SURGERY Right    fluid drained from breast  . CARDIOVERSION N/A 12/20/2018   Procedure: CARDIOVERSION;  Surgeon: Buford Dresser, MD;  Location: [facility] ENDOSCOPY;  Service: Cardiovascular;  Laterality: N/A;  . COLONOSCOPY    . ESOPHAGOGASTRODUODENOSCOPY    . JOINT REPLACEMENT Left    left hip  . LUMBAR LAMINECTOMY/DECOMPRESSION MICRODISCECTOMY N/A 06/05/2015   Procedure: L3-L5 DECOMPRESSION ;  Surgeon: Melina Schools, MD;  Location: [facility];  Service: Orthopedics;  Laterality: N/A;  . OPEN REDUCTION INTERNAL FIXATION (ORIF) DISTAL RADIAL FRACTURE Right 01/10/2014   Procedure: OPEN REDUCTION INTERNAL FIXATION (ORIF) DISTAL RADIAL FRACTURE;  Surgeon: Linna Hoff, MD;  Location: [facility];  Service: Orthopedics;  Laterality: Right;  . SPINAL CORD DECOMPRESSION  06/05/2015   L3 L 5  . THYROID SURGERY    . TONSILLECTOMY    . TUBAL LIGATION    . varicose veins stripped  Current Medications: Outpatient Medications Prior to Visit  Medication Sig Dispense Refill  . acetaminophen (TYLENOL) 500 MG tablet Take 500-1,000 mg by mouth every 6 (six) hours as needed for moderate pain.    Marland Kitchen albuterol (PROVENTIL HFA;VENTOLIN HFA) 108 (90 Base) MCG/ACT inhaler Use 2 inhalations 15-20 minutes apart every 4 hours as needed to rescue Asthma (Patient taking differently: Inhale 2 puffs into the lungs every 4 (four) hours as needed for wheezing. ) 3 Inhaler 3  . apixaban (ELIQUIS) 5 MG TABS tablet Take 1 tablet (5 mg total) by mouth 2 (two) times daily. 60 tablet 11  . Ascorbic Acid  (VITAMIN C) 1000 MG tablet Take 1,000 mg by mouth daily.     Marland Kitchen aspirin EC 81 MG tablet Take 81 mg by mouth daily.    Marland Kitchen atenolol (TENORMIN) 100 MG tablet Take 1 tablet every morning for BP & Tremor (Patient taking differently: Take 100 mg by mouth daily. ) 90 tablet 3  . bumetanide (BUMEX) 1 MG tablet Take 1/2 to 1 tablet 2 x /day if needed for ankle swelling 180 tablet 3  . cholecalciferol (VITAMIN D3) 25 MCG (1000 UT) tablet Take 1,000 Units by mouth 2 (two) times daily.    Marland Kitchen diltiazem (CARDIZEM CD) 120 MG 24 hr capsule Take 1 capsule (120 mg total) by mouth daily. (Patient taking differently: Take 120 mg by mouth 2 (two) times daily. ) 90 capsule 3  . fluticasone (FLONASE) 50 MCG/ACT nasal spray Place 2 sprays into both nostrils daily. (Patient taking differently: Place 2 sprays into both nostrils daily as needed for allergies. ) 16 g 1  . Melatonin 10 MG CAPS Take 10-20 mg by mouth at bedtime as needed (sleep).    . Misc Natural Products (OSTEO BI-FLEX JOINT SHIELD PO) Take 1 tablet by mouth 2 (two) times daily.     . Multiple Vitamins-Minerals (PRESERVISION AREDS 2) CAPS Take 1 capsule by mouth 2 (two) times daily.    . Naphazoline-Pheniramine (OPCON-A) 0.027-0.315 % SOLN Place 1 drop into both eyes daily as needed (allergies).    . vitamin B-12 (CYANOCOBALAMIN) 500 MCG tablet Take 500 mcg by mouth daily.     No facility-administered medications prior to visit.      Allergies:   Accupril [quinapril hcl]; Neosporin [neomycin-bacitracin zn-polymyx]; Prednisone; Reglan [metoclopramide]; Tramadol; and Adhesive [tape]   Social History   Socioeconomic History  . Marital status: Widowed    Spouse name: Not on file  . Number of children: Not on file  . Years of education: Not on file  . Highest education level: Not on file  Occupational History  . Not on file  Social Needs  . Financial resource strain: Not on file  . Food insecurity:    Worry: Not on file    Inability: Not on file  .  Transportation needs:    Medical: Not on file    Non-medical: Not on file  Tobacco Use  . Smoking status: Never Smoker  . Smokeless tobacco: Never Used  Substance and Sexual Activity  . Alcohol use: No  . Drug use: No  . Sexual activity: Not on file  Lifestyle  . Physical activity:    Days per week: Not on file    Minutes per session: Not on file  . Stress: Not on file  Relationships  . Social connections:    Talks on phone: Not on file    Gets together: Not on file    Attends religious service: Not  on file    Active member of club or organization: Not on file    Attends meetings of clubs or organizations: Not on file    Relationship status: Not on file  Other Topics Concern  . Not on file  Social History Narrative  . Not on file     Family History:  The patient's family history includes Cancer in her brother; Heart disease in her sister; Hyperlipidemia in her mother; Hypertension in her mother.   ROS:   Please see the history of present illness.    ROS All other systems reviewed and are negative.   PHYSICAL EXAM:   VS:  BP 140/88 (BP Location: Left Arm)   Temp 98.3 F (36.8 C) (Oral)   Ht 5' 1.5" (1.562 m)   Wt 160 lb 12.8 oz (72.9 kg)   BMI 29.89 kg/m    GEN: Well nourished, well developed, in no acute distress  HEENT: normal  Neck: no JVD, carotid bruits, or masses Cardiac: Irregularly irregular; no murmurs, rubs, or gallops,no edema  Respiratory:  clear to auscultation bilaterally, normal work of breathing GI: soft, nontender, nondistended, + BS MS: no deformity or atrophy  Skin: warm and dry, no rash Neuro:  Alert and Oriented x 3, Strength and sensation are intact Psych: euthymic mood, full affect  Wt Readings from Last 3 Encounters:  01/10/19 160 lb 12.8 oz (72.9 kg)  12/29/18 161 lb 3.2 oz (73.1 kg)  12/15/18 165 lb 9.6 oz (75.1 kg)      Studies/Labs Reviewed:   EKG:  EKG is ordered today.  The ekg ordered today demonstrates atrial fibrillation,  heart rate 74  Recent Labs: 11/21/2018: B Natriuretic Peptide 391.6 12/29/2018: ALT 20; BUN 16; Creat 0.91; Hemoglobin 14.0; Magnesium 2.3; Platelets 181; Potassium 4.4; Sodium 143; TSH 0.45   Lipid Panel    Component Value Date/Time   CHOL 178 12/29/2018 1011   TRIG 86 12/29/2018 1011   HDL 48 (L) 12/29/2018 1011   CHOLHDL 3.7 12/29/2018 1011   VLDL 15 05/04/2017 1339   LDLCALC 111 (H) 12/29/2018 1011    Additional studies/ records that were reviewed today include:   Myoview 08/18/2017 Study Highlights     Nuclear stress EF: 70%.  There was no ST segment deviation noted during stress.  The study is normal.  The left ventricular ejection fraction is hyperdynamic (>65%).   1. EF 70%, normal wall motion.  2. No evidence for ischemia or infarction.   Normal study.    Echo 11/22/2018 ------------------------------------------------------------------- Study Conclusions  - Procedure narrative: Transthoracic echocardiography. Image   quality was adequate. The study was technically difficult. - Left ventricle: The cavity size was normal. Wall thickness was   increased in a pattern of mild LVH. Systolic function was normal.   The estimated ejection fraction was in the range of 60% to 65%.   Wall motion was normal; there were no regional wall motion   abnormalities. The study was not technically sufficient to allow   evaluation of LV diastolic dysfunction due to atrial   fibrillation. - Aortic valve: There was trivial regurgitation directed   eccentrically in the LVOT and towards the mitral anterior   leaflet. - Left atrium: The atrium was mildly dilated. - Pulmonic valve: There was trivial regurgitation. - Pericardium, extracardiac: A trivial, free-flowing pericardial   effusion was identified circumferential to the heart.    ASSESSMENT:    1. Persistent atrial fibrillation   2. Hyperlipidemia, unspecified hyperlipidemia type   3.  Medication management   4.  Essential hypertension   5. Chronic diastolic (congestive) heart failure (HCC)      PLAN:  In order of problems listed above:  1. Persistent atrial fibrillation: She recently underwent DC cardioversion, however quickly went back into atrial fibrillation.  Her PCP has increased her diltiazem to 120 mg twice daily.  We discussed the various options during today's interview, I will attempt a rhythm control since she is somewhat asymptomatic with dyspnea while the atrial fibrillation.  I started her on amiodarone 400 mg twice daily for 1 week and will decrease the dose to 200 mg daily thereafter.  If she is still maintaining atrial fibrillation on follow-up in 3 weeks, I will schedule her a another outpatient DC cardioversion continue Eliquis.  2. Chronic diastolic heart failure: Euvolemic on physical exam  3. Hypertension: Blood pressure stable on current therapy  4. Hyperlipidemia: She is currently not on statin at home.  Recent lipid panel obtained on 12/29/2018 showed LDL 111, HDL 48, total cholesterol 178, triglyceride 86.  Continue diet and exercise    Medication Adjustments/Labs and Tests Ordered: Current medicines are reviewed at length with the patient today.  Concerns regarding medicines are outlined above.  Medication changes, Labs and Tests ordered today are listed in the Patient Instructions below. Patient Instructions  Medication Instructions:   START AMIODARONE 400 MG 2 TIMES A DAY FOR 7 DAYS then TAKE 200 MG DAILY If you need a refill on your cardiac medications before your next appointment, please call your pharmacy.   Lab work: You will need to have labs drawn in 3-4 weeks prior to next appointment   TSH  LIVER FUNCTION  If you have labs (blood work) drawn today and your tests are completely normal, you will receive your results only by: Marland Kitchen MyChart Message (if you have MyChart) OR . A paper copy in the mail If you have any lab test that is abnormal or we need to change  your treatment, we will call you to review the results.  Testing/Procedures: NONE   Follow-Up: . Your physician recommends that you follow-up AS SCHEDULED with Almyra Deforest, PA-C as scheduled .   Any Other Special Instructions Will Be Listed Below (If Applicable). Next appointment you will have an EKG performed if you are still in A-Fib a cardioversion will be set up for you      Signed, Almyra Deforest, [place]  01/11/2019 4:30 PM    [address], [place], [place]  [address] Phone: [phone]; Fax: [phone]

## 2019-01-11 ENCOUNTER — Encounter: Payer: Self-pay | Admitting: Physician Assistant

## 2019-01-25 ENCOUNTER — Other Ambulatory Visit: Payer: Self-pay

## 2019-01-25 ENCOUNTER — Ambulatory Visit
Admission: RE | Admit: 2019-01-25 | Discharge: 2019-01-25 | Disposition: A | Discharge status: Home / Self Care | Payer: Medicare Other | Source: Ambulatory Visit | Attending: Internal Medicine | Admitting: Internal Medicine

## 2019-01-25 DIAGNOSIS — Z1231 Encounter for screening mammogram for malignant neoplasm of breast: Secondary | ICD-10-CM | POA: Diagnosis not present

## 2019-02-08 ENCOUNTER — Telehealth: Payer: Self-pay | Admitting: Physician Assistant

## 2019-02-08 NOTE — Telephone Encounter (Signed)
   Primary Cardiologist:  Shelva Majestic, MD   Patient contacted.  History reviewed.  No symptoms to suggest any unstable cardiac conditions.  Based on discussion, with current pandemic situation, we will be postponing this appointment for [facility] with a plan for f/u in 8 wks or sooner if feasible/necessary.  If symptoms change, she has been instructed to contact our office.   Routing to C19 CANCEL pool for tracking (P CV DIV CV19 CANCEL - reason for visit "other.") and assigning priority ( 2 = 6-12 wks).   [place], [place]  02/08/2019 5:15 PM         .

## 2019-02-09 ENCOUNTER — Ambulatory Visit: Payer: Medicare Other | Admitting: Physician Assistant

## 2019-03-10 ENCOUNTER — Ambulatory Visit: Payer: Self-pay | Admitting: Adult Health

## 2019-04-03 ENCOUNTER — Encounter: Payer: Self-pay | Admitting: Adult Health

## 2019-04-03 DIAGNOSIS — I5032 Chronic diastolic (congestive) heart failure: Secondary | ICD-10-CM | POA: Insufficient documentation

## 2019-04-03 NOTE — Progress Notes (Deleted)
MEDICARE ANNUAL WELLNESS VISIT AND FOLLOW UP  Assessment:   Diagnoses and all orders for this visit:  Encounter for Medicare annual wellness exam  Essential hypertension Continue medication: propanolol Monitor blood pressure at home; call if consistently over 130/80 Continue DASH diet.   Reminder to go to the ER if any CP, SOB, nausea, dizziness, severe HA, changes vision/speech, left arm numbness and tingling and jaw pain.  GERD Well managed on current medications Discussed diet, avoiding triggers and other lifestyle changes  Osteopenia Repeat DEXA 2021 Continue exercises, calcium and vitamin D   Vitamin D deficiency At goal at recent check; continue to recommend supplementation for goal of 60-100 Defer vitamin D level  Prediabetes Discussed disease and risks Discussed diet/exercise, weight management  A1C  Medication management CBC, CMP/GFR, magnesium   Spinal stenosis of lumbar region without neurogenic claudication Denies any back pain or concerning symptoms  Hyperlipidemia Currently treated by lifestyle with borderline control Continue low cholesterol diet and exercise.  Check lipid panel.   Fibrocystic breast disease (FCBD), unspecified laterality Continue annual mammogram screening  CKD (chronic kidney disease) stage 3, GFR 30-59 ml/min (HCC) Increase fluids, avoid NSAIDS, monitor sugars, will monitor CMP/GFR  Diastolic CHF Appears euvolemic today  Disease process and medications discussed. Questions answered fully. Emphasized salt restriction, less than 2000mg  a day. Encouraged daily monitoring of the patient's weight, call office if 5 lb weight loss or gain in a day.  Encouraged regular exercise. If any increasing shortness of breath, swelling, or chest pressure go to ER immediately.  decrease your fluid intake to less than 2 L daily please remember to always increase your potassium intake with any increase of your fluid pill.   A. Fib  S/p failed  cardioversion, on amiodarone, elequis Repeat CV pending due to covid 19 Rate controlled today  Followed by Dr. Claiborne Billings   Essential tremors  ***  Over 40 minutes of exam, counseling, chart review and critical decision making was performed Future Appointments  Date Time Provider [place]  04/05/2019  4:15 PM [name], NP [facility] None  07/20/2019  4:00 PM Unk Pinto, MD [facility] None  02/28/2020  3:00 PM Unk Pinto, MD [facility] None     Plan:   During the course of the visit the patient was educated and counseled about appropriate screening and preventive services including:    Pneumococcal vaccine   Prevnar 13  Influenza vaccine  Td vaccine  Screening electrocardiogram  Bone densitometry screening  Colorectal cancer screening  Diabetes screening  Glaucoma screening  Nutrition counseling   Advanced directives: requested   Subjective:  Briana [name] is a 85 y.o. female who presents for Medicare Annual Wellness Visit and 3 month follow up. Patient's GERD is controlled with her meds.   Patient is also followed by Dr Nelva Bush with [facility] for Lumbar spinal stenosis and had L3/L5 SS decompression surg in July 2016 by Dr Apolonio Schneiders.   She also has hereditary or essential tremors, was formerly on inderal *** but was switched off due to cost concerns *** now on atenolol 100 mg ***  Propranolol 30-40 mg TID/QID is cheap if needed ***  BMI is There is no height or weight on file to calculate BMI., she has been working on diet (avoiding fried foods) and exercise. Wt Readings from Last 3 Encounters:  01/10/19 160 lb 12.8 oz (72.9 kg)  12/29/18 161 lb 3.2 oz (73.1 kg)  12/15/18 165 lb 9.6 oz (75.1 kg)   Patient was recently dx'd  with cAfib on Nov 21, 2018 and started on Eliquis. She also was dx'd with mild diastolic CHF and diuresed in the hospital.   Then on 02.04.2020 she had successful CV by Dr Neva Seat, but later on 12/29/2018 was found to  be back in a. Fib and has since been on amiodarone pending repeat CV ***  Her blood pressure has been controlled at home, today their BP is   She does workout. She denies chest pain, shortness of breath, dizziness.   She is not on cholesterol medication due to mild elevations, age and benign medical history. Her cholesterol is not at goal. The cholesterol last visit was:   Lab Results  Component Value Date   CHOL 178 12/29/2018   HDL 48 (L) 12/29/2018   LDLCALC 111 (H) 12/29/2018   TRIG 86 12/29/2018   CHOLHDL 3.7 12/29/2018    She has been working on diet and exercise for prediabetes, and denies foot ulcerations, increased appetite, nausea, paresthesia of the feet, polydipsia, polyuria, visual disturbances, vomiting and weight loss. Last A1C in the office was:  Lab Results  Component Value Date   HGBA1C 6.4 (H) 12/29/2018   Last GFR: Lab Results  Component Value Date   GFRNONAA 58 (L) 12/29/2018   Patient is on Vitamin D supplement and at goal at recent check:    Lab Results  Component Value Date   VD25OH 65 12/29/2018      Medication Review: Current Outpatient Medications on File Prior to Visit  Medication Sig Dispense Refill  . acetaminophen (TYLENOL) 500 MG tablet Take 500-1,000 mg by mouth every 6 (six) hours as needed for moderate pain.    Marland Kitchen albuterol (PROVENTIL HFA;VENTOLIN HFA) 108 (90 Base) MCG/ACT inhaler Use 2 inhalations 15-20 minutes apart every 4 hours as needed to rescue Asthma (Patient taking differently: Inhale 2 puffs into the lungs every 4 (four) hours as needed for wheezing. ) 3 Inhaler 3  . amiodarone (PACERONE) 200 MG tablet Take 1 tablet (200 mg total) by mouth daily. 90 tablet 3  . apixaban (ELIQUIS) 5 MG TABS tablet Take 1 tablet (5 mg total) by mouth 2 (two) times daily. 60 tablet 11  . Ascorbic Acid (VITAMIN C) 1000 MG tablet Take 1,000 mg by mouth daily.     Marland Kitchen aspirin EC 81 MG tablet Take 81 mg by mouth daily.    Marland Kitchen atenolol (TENORMIN) 100 MG tablet  Take 1 tablet every morning for BP & Tremor (Patient taking differently: Take 100 mg by mouth daily. ) 90 tablet 3  . bumetanide (BUMEX) 1 MG tablet Take 1/2 to 1 tablet 2 x /day if needed for ankle swelling 180 tablet 3  . cholecalciferol (VITAMIN D3) 25 MCG (1000 UT) tablet Take 1,000 Units by mouth 2 (two) times daily.    Marland Kitchen diltiazem (CARDIZEM CD) 120 MG 24 hr capsule Take 1 capsule (120 mg total) by mouth daily. (Patient taking differently: Take 120 mg by mouth 2 (two) times daily. ) 90 capsule 3  . fluticasone (FLONASE) 50 MCG/ACT nasal spray Place 2 sprays into both nostrils daily. (Patient taking differently: Place 2 sprays into both nostrils daily as needed for allergies. ) 16 g 1  . Melatonin 10 MG CAPS Take 10-20 mg by mouth at bedtime as needed (sleep).    . Misc Natural Products (OSTEO BI-FLEX JOINT SHIELD PO) Take 1 tablet by mouth 2 (two) times daily.     . Multiple Vitamins-Minerals (PRESERVISION AREDS 2) CAPS Take 1 capsule  by mouth 2 (two) times daily.    . Naphazoline-Pheniramine (OPCON-A) 0.027-0.315 % SOLN Place 1 drop into both eyes daily as needed (allergies).    . vitamin B-12 (CYANOCOBALAMIN) 500 MCG tablet Take 500 mcg by mouth daily.     No current facility-administered medications on file prior to visit.     Allergies  Allergen Reactions  . Accupril [Quinapril Hcl] Cough  . Neosporin [Neomycin-Bacitracin Zn-Polymyx]     unknown  . Prednisone     Agitated and shaky  . Reglan [Metoclopramide] Other (See Comments)    tremors  . Tramadol Nausea And Vomiting  . Adhesive [Tape] Rash    Current Problems (verified) Patient Active Problem List   Diagnosis Date Noted  . Chronic diastolic heart failure ([place]) 04/03/2019  . Unspecified atrial fibrillation ([place]) 11/21/2018  . CKD (chronic kidney disease) stage 3, GFR 30-59 ml/min (HCC) 02/23/2018  . Bilateral sensorineural hearing loss 07/02/2017  . Obesity (BMI 30.0-34.9) 09/05/2015  . Lumbar stenosis 06/05/2015  .  Medication management 06/13/2014  . GERD 11/20/2013  . Osteoporosis 11/20/2013  . Essential hypertension 11/19/2013  . Hyperlipidemia 11/19/2013  . Other abnormal glucose (prediabetes) 11/19/2013  . Vitamin D deficiency 11/19/2013  . Diffuse cystic mastopathy 11/19/2013    Screening Tests Immunization History  Administered Date(s) Administered  . DT 09/05/2015  . DTaP 11/16/2004  . Influenza Whole 08/30/2013  . Influenza, High Dose Seasonal PF 08/28/2015, 08/10/2016  . Pneumococcal Conjugate-13 10/19/2016  . Pneumococcal Polysaccharide-23 11/16/2001  . Zoster 06/01/2013   Preventative care: Last colonoscopy: 2011 DONE Mammogram: 01/2019 DEXA: 2019 T radius T -1.6  Prior vaccinations: TD or Tdap: 2016  Influenza: 2017, declines  Pneumococcal: 2003 Prevnar13: 2017 Shingles/Zostavax: 2014  Names of Other Physician/Practitioners you currently use: 1. [facility] here for primary care 2. Dr. Katy Fitch, eye doctor, last visit 2018 3. Dr. Eliezer Bottom , dentist, last visit 2018  Patient Care Team: Unk Pinto, MD as PCP - General (Internal Medicine) Troy Sine, MD as PCP - Cardiology (Cardiology) Iran Planas, MD as Consulting Physician (Orthopedic Surgery) Ladene Artist, MD as Consulting Physician (Gastroenterology) Suella Broad, MD as Consulting Physician (Physical Medicine and Rehabilitation) Rolm Bookbinder, MD as Consulting Physician (Dermatology) Buford Dresser, MD as Consulting Physician (Cardiology)  SURGICAL HISTORY She  has a past surgical history that includes Thyroid surgery; varicose veins stripped; Tonsillectomy; Appendectomy; Tubal ligation; Open reduction internal fixation (orif) distal radial fracture (Right, 01/10/2014); Colonoscopy; Esophagogastroduodenoscopy; Breast surgery (Right); Joint replacement (Left); Spinal cord decompression (06/05/2015); Lumbar laminectomy/decompression microdiscectomy (N/A,  06/05/2015); and Cardioversion (N/A, 12/20/2018). FAMILY HISTORY Her family history includes Cancer in her brother; Heart disease in her sister; Hyperlipidemia in her mother; Hypertension in her mother. SOCIAL HISTORY She  reports that she has never smoked. She has never used smokeless tobacco. She reports that she does not drink alcohol or use drugs.   MEDICARE WELLNESS OBJECTIVES: Physical activity:   Cardiac risk factors:   Depression/mood screen:   Depression screen [facility] 2/9 12/29/2018  Decreased Interest 0  Down, Depressed, Hopeless 0  PHQ - 2 Score 0    ADLs:  In your present state of health, do you have any difficulty performing the following activities: 12/29/2018 12/03/2018  Hearing? N N  Comment - -  Vision? N N  Difficulty concentrating or making decisions? N N  Walking or climbing stairs? N N  Dressing or bathing? N N  Doing errands, shopping? N N  Some recent data might be hidden  Cognitive Testing  Alert? Yes  Normal Appearance?Yes  Oriented to person? Yes  Place? Yes   Time? Yes  Recall of three objects?  Yes  Can perform simple calculations? Yes  Displays appropriate judgment?Yes  Can read the correct time from a watch face?Yes  EOL planning:    Review of Systems  Constitutional: Negative for malaise/fatigue and weight loss.  HENT: Negative for hearing loss and tinnitus.   Eyes: Negative for blurred vision and double vision.  Respiratory: Negative for cough, sputum production, shortness of breath and wheezing.   Cardiovascular: Negative for chest pain, palpitations, orthopnea, claudication, leg swelling and PND.  Gastrointestinal: Negative for abdominal pain, blood in stool, constipation, diarrhea, heartburn, melena, nausea and vomiting.  Genitourinary: Negative.   Musculoskeletal: Negative for falls, joint pain and myalgias.  Skin: Negative for rash.  Neurological: Negative for dizziness, tingling, sensory change, weakness and headaches.   Endo/Heme/Allergies: Negative for polydipsia.  Psychiatric/Behavioral: Negative.  Negative for depression, memory loss, substance abuse and suicidal ideas. The patient is not nervous/anxious and does not have insomnia.   All other systems reviewed and are negative.    Objective:     There were no vitals filed for this visit. There is no height or weight on file to calculate BMI.  General appearance: alert, no distress, WD/WN, female HEENT: normocephalic, sclerae anicteric, TMs pearly, nares patent, no discharge or erythema, pharynx normal Oral cavity: MMM, no lesions Neck: supple, no lymphadenopathy, no thyromegaly, no masses Heart: RRR, normal S1, S2, no murmurs Lungs: CTA bilaterally, no wheezes, rhonchi, or rales Abdomen: +bs, soft, non tender, non distended, no masses, no hepatomegaly, no splenomegaly Musculoskeletal: nontender, no swelling, no obvious deformity Extremities: no edema, no cyanosis, no clubbing Pulses: 2+ symmetric, upper and lower extremities, normal cap refill Neurological: alert, oriented x 3, CN2-12 intact, strength normal upper extremities and lower extremities, sensation normal throughout, DTRs 2+ throughout, no cerebellar signs, gait normal Psychiatric: normal affect, behavior normal, pleasant   Medicare Attestation I have personally reviewed: The patient's medical and social history Their use of alcohol, tobacco or illicit drugs Their current medications and supplements The patient's functional ability including ADLs,fall risks, home safety risks, cognitive, and hearing and visual impairment Diet and physical activities Evidence for depression or mood disorders  The patient's weight, height, BMI, and visual acuity have been recorded in the chart.  I have made referrals, counseling, and provided education to the patient based on review of the above and I have provided the patient with a written personalized care plan for preventive services.     Izora Ribas, NP   04/03/2019

## 2019-04-05 ENCOUNTER — Encounter: Payer: Self-pay | Admitting: Adult Health

## 2019-04-05 ENCOUNTER — Ambulatory Visit: Payer: Self-pay | Admitting: Adult Health

## 2019-04-05 ENCOUNTER — Other Ambulatory Visit: Payer: Self-pay

## 2019-04-05 ENCOUNTER — Ambulatory Visit (INDEPENDENT_AMBULATORY_CARE_PROVIDER_SITE_OTHER): Payer: Medicare Other | Admitting: Adult Health

## 2019-04-05 VITALS — BP 110/70 | HR 74 | Temp 97.7°F | Ht 61.5 in | Wt 158.0 lb

## 2019-04-05 DIAGNOSIS — E559 Vitamin D deficiency, unspecified: Secondary | ICD-10-CM

## 2019-04-05 DIAGNOSIS — R7309 Other abnormal glucose: Secondary | ICD-10-CM

## 2019-04-05 DIAGNOSIS — H903 Sensorineural hearing loss, bilateral: Secondary | ICD-10-CM | POA: Diagnosis not present

## 2019-04-05 DIAGNOSIS — N183 Chronic kidney disease, stage 3 unspecified: Secondary | ICD-10-CM

## 2019-04-05 DIAGNOSIS — M48061 Spinal stenosis, lumbar region without neurogenic claudication: Secondary | ICD-10-CM

## 2019-04-05 DIAGNOSIS — Z0001 Encounter for general adult medical examination with abnormal findings: Secondary | ICD-10-CM | POA: Diagnosis not present

## 2019-04-05 DIAGNOSIS — E782 Mixed hyperlipidemia: Secondary | ICD-10-CM | POA: Diagnosis not present

## 2019-04-05 DIAGNOSIS — I10 Essential (primary) hypertension: Secondary | ICD-10-CM

## 2019-04-05 DIAGNOSIS — I5032 Chronic diastolic (congestive) heart failure: Secondary | ICD-10-CM

## 2019-04-05 DIAGNOSIS — R6889 Other general symptoms and signs: Secondary | ICD-10-CM | POA: Diagnosis not present

## 2019-04-05 DIAGNOSIS — K2100 Gastro-esophageal reflux disease with esophagitis, without bleeding: Secondary | ICD-10-CM

## 2019-04-05 DIAGNOSIS — Z0000 Encounter for general adult medical examination without abnormal findings: Secondary | ICD-10-CM

## 2019-04-05 DIAGNOSIS — N6019 Diffuse cystic mastopathy of unspecified breast: Secondary | ICD-10-CM

## 2019-04-05 DIAGNOSIS — I480 Paroxysmal atrial fibrillation: Secondary | ICD-10-CM

## 2019-04-05 DIAGNOSIS — Z79899 Other long term (current) drug therapy: Secondary | ICD-10-CM

## 2019-04-05 DIAGNOSIS — M85831 Other specified disorders of bone density and structure, right forearm: Secondary | ICD-10-CM | POA: Diagnosis not present

## 2019-04-05 DIAGNOSIS — E669 Obesity, unspecified: Secondary | ICD-10-CM

## 2019-04-05 MED ORDER — DILTIAZEM HCL ER COATED BEADS 120 MG PO CP24: 120.0000 mg | ORAL_CAPSULE | Freq: Two times a day (BID) | ORAL | 2 refills | Status: DC

## 2019-04-05 NOTE — Progress Notes (Signed)
MEDICARE ANNUAL WELLNESS VISIT AND FOLLOW UP  Assessment:   Diagnoses and all orders for this visit:  Encounter for Medicare annual wellness exam  A. Fib No concerns with excessive bleeding, no falls Continue medication: elequis Followed by cardiology Rate controlled today  CHF Weights stable, appears euvolemic Disease process and medications discussed. Questions answered fully. Emphasized salt restriction, less than 2000mg  a day. Encouraged daily monitoring of the patient's weight, call office if 5 lb weight loss or gain in a day.  Encouraged regular exercise. If any increasing shortness of breath, swelling, or chest pressure go to ER immediately.  decrease your fluid intake to less than 2 L daily please remember to always increase your potassium intake with any increase of your fluid pill.    Essential hypertension Continue medication: propanolol Monitor blood pressure at home; call if consistently over 130/80 Continue DASH diet.   Reminder to go to the ER if any CP, SOB, nausea, dizziness, severe HA, changes vision/speech, left arm numbness and tingling and jaw pain.  GERD Well managed on current medications Discussed diet, avoiding triggers and other lifestyle changes  Age-related osteoporosis without current pathological fracture Will refer back for DEXA today Continue exercises, calcium and vitamin D   Vitamin D deficiency At goal at recent check; continue to recommend supplementation for goal of 70-100 Defer vitamin D level  Prediabetes Discussed disease and risks Discussed diet/exercise, weight management  A1C  Medication management CBC, CMP/GFR, magnesium   Spinal stenosis of lumbar region without neurogenic claudication Denies any back pain or concerning symptoms  Hyperlipidemia Currently treated by lifestyle with borderline control Continue low cholesterol diet and exercise.  Check lipid panel.   Fibrocystic breast disease (FCBD), unspecified  laterality Continue annual mammogram screening  CKD (chronic kidney disease) stage 3, GFR 30-59 ml/min (HCC) Increase fluids, avoid NSAIDS, monitor sugars, will monitor CMP/GFR  Over 40 minutes of exam, counseling, chart review and critical decision making was performed Future Appointments  Date Time Provider [place]  07/20/2019  4:00 PM Unk Pinto, MD [facility] None  02/28/2020  3:00 PM Unk Pinto, MD [facility] None     Plan:   During the course of the visit the patient was educated and counseled about appropriate screening and preventive services including:    Pneumococcal vaccine   Prevnar 13  Influenza vaccine  Td vaccine  Screening electrocardiogram  Bone densitometry screening  Colorectal cancer screening  Diabetes screening  Glaucoma screening  Nutrition counseling   Advanced directives: requested   Subjective:  Briana [name] is a 85 y.o. female who presents for Medicare Annual Wellness Visit and 3 month follow up.    Patient's GERD is controlled with her meds.   Patient is also followed by Dr Nelva Bush with [facility] for Lumbar spinal stenosis and had L3/L5 SS decompression surg in July 2016 by Dr Apolonio Schneiders.   She also has hereditary or essential tremors which have improved with Propanolol which she takes also for her HTN.   She is newly diagnosed with a. Fib; failed cardioversion in Feb, on amiodarone and elequis, cardizem 240 mg and atenolol, postponing repeat CV due to covid 19. Followed by Dr. Claiborne Billings.   BMI is Body mass index is 29.37 kg/m., she has been working on diet (avoiding fried foods) and exercise. Wt Readings from Last 3 Encounters:  04/05/19 158 lb (71.7 kg)  01/10/19 160 lb 12.8 oz (72.9 kg)  12/29/18 161 lb 3.2 oz (73.1 kg)    Her blood pressure has been  controlled at home, today their BP is BP: 110/70 She does workout. She denies chest pain, shortness of breath, dizziness.    She is not on cholesterol medication and  denies myalgias. Her cholesterol is not at goal. The cholesterol last visit was:   Lab Results  Component Value Date   CHOL 178 12/29/2018   HDL 48 (L) 12/29/2018   LDLCALC 111 (H) 12/29/2018   TRIG 86 12/29/2018   CHOLHDL 3.7 12/29/2018    She has been working on diet and exercise for prediabetes, and denies foot ulcerations, increased appetite, nausea, paresthesia of the feet, polydipsia, polyuria, visual disturbances, vomiting and weight loss. Last A1C in the office was:  Lab Results  Component Value Date   HGBA1C 6.4 (H) 12/29/2018   Last GFR: Lab Results  Component Value Date   GFRNONAA 58 (L) 12/29/2018   Patient is on Vitamin D supplement and at goal at recent check:    Lab Results  Component Value Date   VD25OH 65 12/29/2018      Medication Review: Current Outpatient Medications on File Prior to Visit  Medication Sig Dispense Refill  . acetaminophen (TYLENOL) 500 MG tablet Take 500-1,000 mg by mouth every 6 (six) hours as needed for moderate pain.    Marland Kitchen albuterol (PROVENTIL HFA;VENTOLIN HFA) 108 (90 Base) MCG/ACT inhaler Use 2 inhalations 15-20 minutes apart every 4 hours as needed to rescue Asthma (Patient taking differently: Inhale 2 puffs into the lungs every 4 (four) hours as needed for wheezing. ) 3 Inhaler 3  . amiodarone (PACERONE) 200 MG tablet Take 1 tablet (200 mg total) by mouth daily. 90 tablet 3  . apixaban (ELIQUIS) 5 MG TABS tablet Take 1 tablet (5 mg total) by mouth 2 (two) times daily. 60 tablet 11  . Ascorbic Acid (VITAMIN C) 1000 MG tablet Take 1,000 mg by mouth daily.     Marland Kitchen aspirin EC 81 MG tablet Take 81 mg by mouth daily.    Marland Kitchen atenolol (TENORMIN) 100 MG tablet Take 1 tablet every morning for BP & Tremor (Patient taking differently: Take 100 mg by mouth daily. ) 90 tablet 3  . bumetanide (BUMEX) 1 MG tablet Take 1/2 to 1 tablet 2 x /day if needed for ankle swelling 180 tablet 3  . cholecalciferol (VITAMIN D3) 25 MCG (1000 UT) tablet Take 1,000 Units by  mouth 2 (two) times daily.    . fluticasone (FLONASE) 50 MCG/ACT nasal spray Place 2 sprays into both nostrils daily. (Patient taking differently: Place 2 sprays into both nostrils daily as needed for allergies. ) 16 g 1  . Melatonin 10 MG CAPS Take 10-20 mg by mouth at bedtime as needed (sleep).    . Misc Natural Products (OSTEO BI-FLEX JOINT SHIELD PO) Take 1 tablet by mouth 2 (two) times daily.     . Multiple Vitamins-Minerals (PRESERVISION AREDS 2) CAPS Take 1 capsule by mouth 2 (two) times daily.    . Naphazoline-Pheniramine (OPCON-A) 0.027-0.315 % SOLN Place 1 drop into both eyes daily as needed (allergies).    . vitamin B-12 (CYANOCOBALAMIN) 500 MCG tablet Take 500 mcg by mouth daily.     No current facility-administered medications on file prior to visit.     Allergies  Allergen Reactions  . Accupril [Quinapril Hcl] Cough  . Neosporin [Neomycin-Bacitracin Zn-Polymyx]     unknown  . Prednisone     Agitated and shaky  . Reglan [Metoclopramide] Other (See Comments)    tremors  . Tramadol Nausea And  Vomiting  . Adhesive [Tape] Rash    Current Problems (verified) Patient Active Problem List   Diagnosis Date Noted  . Chronic diastolic heart failure ([place]) 04/03/2019  . Unspecified atrial fibrillation ([place]) 11/21/2018  . CKD (chronic kidney disease) stage 3, GFR 30-59 ml/min (HCC) 02/23/2018  . Bilateral sensorineural hearing loss 07/02/2017  . Obesity (BMI 30.0-34.9) 09/05/2015  . Lumbar stenosis 06/05/2015  . Medication management 06/13/2014  . GERD 11/20/2013  . Osteopenia 11/20/2013  . Essential hypertension 11/19/2013  . Hyperlipidemia 11/19/2013  . Other abnormal glucose (prediabetes) 11/19/2013  . Vitamin D deficiency 11/19/2013  . Diffuse cystic mastopathy 11/19/2013    Screening Tests Immunization History  Administered Date(s) Administered  . DT 09/05/2015  . DTaP 11/16/2004  . Influenza Whole 08/30/2013  . Influenza, High Dose Seasonal PF 08/28/2015,  08/10/2016  . Pneumococcal Conjugate-13 10/19/2016  . Pneumococcal Polysaccharide-23 11/16/2001  . Zoster 06/01/2013   Preventative care: Last colonoscopy: 2011 Mammogram: 12/2018 DEXA: 2019 - T - 1.6 radius   Prior vaccinations: TD or Tdap: 2016  Influenza: 2017, declines   Pneumococcal: 2003 Prevnar13: 2017 Shingles/Zostavax: 2014  Names of Other Physician/Practitioners you currently use: 1. [facility] here for primary care 2. Dr. Katy Fitch, eye doctor, last visit 2019 3. Dr. Eliezer Bottom & Luretha Rued, dentist, last visit 2019  Patient Care Team: Unk Pinto, MD as PCP - General (Internal Medicine) Troy Sine, MD as PCP - Cardiology (Cardiology) Iran Planas, MD as Consulting Physician (Orthopedic Surgery) Ladene Artist, MD as Consulting Physician (Gastroenterology) Suella Broad, MD as Consulting Physician (Physical Medicine and Rehabilitation) Rolm Bookbinder, MD as Consulting Physician (Dermatology) Buford Dresser, MD as Consulting Physician (Cardiology)  SURGICAL HISTORY She  has a past surgical history that includes Thyroid surgery; varicose veins stripped; Tonsillectomy; Appendectomy; Tubal ligation; Open reduction internal fixation (orif) distal radial fracture (Right, 01/10/2014); Colonoscopy; Esophagogastroduodenoscopy; Breast surgery (Right); Joint replacement (Left); Spinal cord decompression (06/05/2015); Lumbar laminectomy/decompression microdiscectomy (N/A, 06/05/2015); and Cardioversion (N/A, 12/20/2018). FAMILY HISTORY Her family history includes Cancer in her brother; Heart disease in her sister; Hyperlipidemia in her mother; Hypertension in her mother. SOCIAL HISTORY She  reports that she has never smoked. She has never used smokeless tobacco. She reports that she does not drink alcohol or use drugs.   MEDICARE WELLNESS OBJECTIVES: Physical activity: Current Exercise Habits: Home exercise routine, Type of exercise:  walking, Time (Minutes): 15, Frequency (Times/Week): 5, Weekly Exercise (Minutes/Week): 75, Intensity: Mild, Exercise limited by: orthopedic condition(s) Cardiac risk factors: Cardiac Risk Factors include: advanced age (>8men, >42 women);dyslipidemia;hypertension;sedentary lifestyle;smoking/ tobacco exposure(secondary smoke exposure ) Depression/mood screen:   Depression screen [facility] 2/9 04/05/2019  Decreased Interest 0  Down, Depressed, Hopeless 1  PHQ - 2 Score 1    ADLs:  In your present state of health, do you have any difficulty performing the following activities: 04/05/2019 12/29/2018  Hearing? N N  Comment bilateral hearing aids working well  -  Vision? N N  Difficulty concentrating or making decisions? N N  Walking or climbing stairs? N N  Comment uses rail on steps on home and does well  -  Dressing or bathing? N N  Doing errands, shopping? N N  Preparing Food and eating ? N -  Using the Toilet? N -  In the past six months, have you accidently leaked urine? N -  Do you have problems with loss of bowel control? N -  Managing your Medications? N -  Managing your Finances? N -  Comment daughter helps when needed  -  Housekeeping or managing your Housekeeping? N -  Some recent data might be hidden     Cognitive Testing  Alert? Yes  Normal Appearance?Yes  Oriented to person? Yes  Place? Yes   Time? Yes  Recall of three objects?  Yes  Can perform simple calculations? Yes  Displays appropriate judgment?Yes  Can read the correct time from a watch face?Yes  EOL planning: Does Patient Have a Medical Advance Directive?: No Would patient like information on creating a medical advance directive?: No - Patient declined(has papers)  Review of Systems  Constitutional: Negative for malaise/fatigue and weight loss.  HENT: Negative for hearing loss and tinnitus.   Eyes: Negative for blurred vision and double vision.  Respiratory: Negative for cough, sputum production, shortness of  breath and wheezing.   Cardiovascular: Negative for chest pain, palpitations, orthopnea, claudication, leg swelling and PND.  Gastrointestinal: Negative for abdominal pain, blood in stool, constipation, diarrhea, heartburn, melena, nausea and vomiting.  Genitourinary: Negative.   Musculoskeletal: Negative for falls, joint pain and myalgias.  Skin: Negative for rash.  Neurological: Negative for dizziness, tingling, sensory change, weakness and headaches.  Endo/Heme/Allergies: Negative for polydipsia.  Psychiatric/Behavioral: Negative.  Negative for depression, memory loss, substance abuse and suicidal ideas. The patient is not nervous/anxious and does not have insomnia.   All other systems reviewed and are negative.    Objective:     Today's Vitals   04/05/19 1331  BP: 110/70  Pulse: 74  Temp: 97.7 F (36.5 C)  SpO2: 97%  Weight: 158 lb (71.7 kg)  Height: 5' 1.5" (1.562 m)  PainSc: 5   PainLoc: Back   Body mass index is 29.37 kg/m.  General appearance: alert, no distress, WD/WN, female HEENT: normocephalic, sclerae anicteric, TMs pearly, nares patent, no discharge or erythema, pharynx normal Oral cavity: MMM, no lesions Neck: supple, no lymphadenopathy, no thyromegaly, no masses Heart: Irregularly irregular, normal S1, S2, no murmurs Lungs: CTA bilaterally, no wheezes, rhonchi, or rales Abdomen: +bs, soft, non tender, non distended, no masses, no hepatomegaly, no splenomegaly Musculoskeletal: nontender, no swelling, no obvious deformity, neg straight leg raise  Extremities: no edema, no cyanosis, no clubbing Pulses: 2+ symmetric, upper and lower extremities, normal cap refill Neurological: alert, oriented x 3, CN2-12 intact, strength normal upper extremities and lower extremities, sensation normal throughout, DTRs 2+ throughout, no cerebellar signs, gait normal Psychiatric: normal affect, behavior normal, pleasant   Medicare Attestation I have personally reviewed: The  patient's medical and social history Their use of alcohol, tobacco or illicit drugs Their current medications and supplements The patient's functional ability including ADLs,fall risks, home safety risks, cognitive, and hearing and visual impairment Diet and physical activities Evidence for depression or mood disorders  The patient's weight, height, BMI, and visual acuity have been recorded in the chart.  I have made referrals, counseling, and provided education to the patient based on review of the above and I have provided the patient with a written personalized care plan for preventive services.     Izora Ribas, NP   04/05/2019

## 2019-04-05 NOTE — Patient Instructions (Addendum)
Ms. Briana [name] , Thank you for taking time to come for your Medicare Wellness Visit. I appreciate your ongoing commitment to your health goals. Please review the following plan we discussed and let me know if I can assist you in the future.   These are the goals we discussed: Goals    . Exercise 150 min/wk Moderate Activity       This is a list of the screening recommended for you and due dates:  Health Maintenance  Topic Date Due  . Flu Shot  06/17/2019  . Urine Protein Check  12/30/2019  . Tetanus Vaccine  09/04/2025  . DEXA scan (bone density measurement)  Completed  . Pneumonia vaccines  Completed      Atrial Fibrillation Atrial fibrillation is a type of irregular or rapid heartbeat (arrhythmia). In atrial fibrillation, the top part of the heart (atria) quivers in a chaotic pattern. This makes the heart unable to pump blood normally. Having atrial fibrillation can increase your risk for other health problems, such as:  Blood can pool in the atria and form clots. If a clot travels to the brain, it can cause a stroke.  The heart muscle may weaken from the irregular blood flow. This can cause heart failure. Atrial fibrillation may start suddenly and stop on its own, or it may become a long-lasting problem. What are the causes? This condition is caused by some heart-related conditions or procedures, including:  High blood pressure. This is the most common cause.  Heart failure.  Heart valve conditions.  Inflammation of the sac that surrounds the heart (pericarditis).  Heart surgery.  Coronary artery disease.  Certain heart rhythm disorders, such as Wolf-Parkinson-White syndrome. Other causes include:  Pneumonia.  Obstructive sleep apnea.  Lung cancer.  Thyroid problems, especially if the thyroid is overactive (hyperthyroidism).  Excessive alcohol or drug use. Sometimes, the cause of this condition is not known. What increases the risk? This condition is more  likely to develop in:  Older people.  People who smoke.  People who have diabetes mellitus.  People who are overweight (obese).  Athletes who exercise vigorously.  People who have a family history. What are the signs or symptoms? Symptoms of this condition include:  A feeling that your heart is beating rapidly or irregularly.  A feeling of discomfort or pain in your chest.  Shortness of breath.  Sudden light-headedness or weakness.  Getting tired easily during exercise. In some cases, there are no symptoms. How is this diagnosed? Your health care provider may be able to detect atrial fibrillation when taking your pulse. If detected, this condition may be diagnosed with:  Electrocardiogram (ECG).  Ambulatory cardiac monitor. This device records your heartbeats for 24 hours or more.  Transthoracic echocardiogram (TTE) to evaluate how blood flows through your heart.  Transesophageal echocardiogram (TEE) to view more detailed images of your heart.  A stress test.  Imaging tests, such as a CT scan or chest X-ray.  Blood tests. How is this treated? This condition may be treated with:  Medicines to slow down the heart rate or bring the heart's rhythm back to normal.  Medicines to prevent blood clots from forming.  Electrical cardioversion. This delivers a low-energy shock to the heart to reset its rhythm.  Ablation. This procedure destroys the part of the heart tissue that sends abnormal signals.  Left atrial appendage occlusion/excision. This seals off a common place in the atria where blood clots can form (left atrial appendage). The goal of treatment  is to prevent blood clots from forming and to keep your heart beating at a normal rate and rhythm. Treatment depends on underlying medical conditions and how you feel when you are experiencing fibrillation. Follow these instructions at home: Medicines  Take over-the counter and prescription medicines only as told by  your health care provider.  If your health care provider prescribed a blood-thinning medicine (anticoagulant), take it exactly as told. Taking too much blood-thinning medicine can cause bleeding. Taking too little can enable a blood clot to form and travel to the brain, causing a stroke. Lifestyle      Do not use any products that contain nicotine or tobacco, such as cigarettes and e-cigarettes. If you need help quitting, ask your health care provider.  Do not drink beverages that contain caffeine, such as coffee, soda, and tea.  Follow diet instructions as told by your health care provider.  Exercise regularly as told by your health care provider.  Do not drink alcohol. General instructions  If you have obstructive sleep apnea, manage your condition as told by your health care provider.  Maintain a healthy weight. Do not use diet pills unless your health care provider approves. Diet pills may make heart problems worse.  Keep all follow-up visits as told by your health care provider. This is important. Contact a health care provider if you:  Notice a change in the rate, rhythm, or strength of your heartbeat.  Are taking an anticoagulant and you notice increased bruising.  Tire more easily when you exercise or exert yourself.  Have a sudden change in weight. Get help right away if you have:   Chest pain, abdominal pain, sweating, or weakness.  Difficulty breathing.  Blood in your vomit, stool (feces), or urine.  Any symptoms of a stroke. "BE FAST" is an easy way to remember the main warning signs of a stroke: ? B - Balance. Signs are dizziness, sudden trouble walking, or loss of balance. ? E - Eyes. Signs are trouble seeing or a sudden change in vision. ? F - Face. Signs are sudden weakness or numbness of the face, or the face or eyelid drooping on one side. ? A - Arms. Signs are weakness or numbness in an arm. This happens suddenly and usually on one side of the  body. ? S - Speech. Signs are sudden trouble speaking, slurred speech, or trouble understanding what people say. ? T - Time. Time to call emergency services. Write down what time symptoms started.  Other signs of a stroke, such as: ? A sudden, severe headache with no known cause. ? Nausea or vomiting. ? Seizure. These symptoms may represent a serious problem that is an emergency. Do not wait to see if the symptoms will go away. Get medical help right away. Call your local emergency services (911 in the [place] Do not drive yourself to the hospital. Summary  Atrial fibrillation is a type of irregular or rapid heartbeat (arrhythmia).  Symptoms include a feeling that your heart is beating fast or irregularly. In some cases, you may not have symptoms.  The condition is treated with medicines to slow down the heart rate or bring the heart's rhythm back to normal. You may also need blood-thinning medicines to prevent blood clots.  Get help right away if you have symptoms or signs of a stroke. This information is not intended to replace advice given to you by your health care provider. Make sure you discuss any questions you have with your health  care provider. Document Released: 11/02/2005 Document Revised: 12/24/2017 Document Reviewed: 12/24/2017 Elsevier Interactive Patient Education  2019 [employer].

## 2019-04-06 ENCOUNTER — Other Ambulatory Visit: Payer: Self-pay | Admitting: Adult Health

## 2019-04-06 DIAGNOSIS — N183 Chronic kidney disease, stage 3 unspecified: Secondary | ICD-10-CM

## 2019-04-06 LAB — CBC WITH DIFFERENTIAL/PLATELET
Absolute Monocytes: 719 cells/uL (ref 200–950)
Basophils Absolute: 41 cells/uL (ref 0–200)
Basophils Relative: 0.7 %
Eosinophils Absolute: 180 cells/uL (ref 15–500)
Eosinophils Relative: 3.1 %
HCT: 42.9 % (ref 35.0–45.0)
Hemoglobin: 13.8 g/dL (ref 11.7–15.5)
Lymphs Abs: 1079 cells/uL (ref 850–3900)
MCH: 29.7 pg (ref 27.0–33.0)
MCHC: 32.2 g/dL (ref 32.0–36.0)
MCV: 92.3 fL (ref 80.0–100.0)
MPV: 11.5 fL (ref 7.5–12.5)
Monocytes Relative: 12.4 %
Neutro Abs: 3782 cells/uL (ref 1500–7800)
Neutrophils Relative %: 65.2 %
Platelets: 198 10*3/uL (ref 140–400)
RBC: 4.65 10*6/uL (ref 3.80–5.10)
RDW: 13.8 % (ref 11.0–15.0)
Total Lymphocyte: 18.6 %
WBC: 5.8 10*3/uL (ref 3.8–10.8)

## 2019-04-06 LAB — COMPLETE METABOLIC PANEL WITH GFR
AG Ratio: 2 (calc) (ref 1.0–2.5)
ALT: 19 U/L (ref 6–29)
AST: 20 U/L (ref 10–35)
Albumin: 4.5 g/dL (ref 3.6–5.1)
Alkaline phosphatase (APISO): 92 U/L (ref 37–153)
BUN/Creatinine Ratio: 19 (calc) (ref 6–22)
BUN: 22 mg/dL (ref 7–25)
CO2: 32 mmol/L (ref 20–32)
Calcium: 9.9 mg/dL (ref 8.6–10.4)
Chloride: 102 mmol/L (ref 98–110)
Creat: 1.17 mg/dL — ABNORMAL HIGH (ref 0.60–0.88)
GFR, Est African American: 50 mL/min/{1.73_m2} — ABNORMAL LOW (ref >=60)
GFR, Est Non African American: 43 mL/min/{1.73_m2} — ABNORMAL LOW (ref >=60)
Globulin: 2.2 g/dL (calc) (ref 1.9–3.7)
Glucose, Bld: 107 mg/dL — ABNORMAL HIGH (ref 65–99)
Potassium: 3.9 mmol/L (ref 3.5–5.3)
Sodium: 142 mmol/L (ref 135–146)
Total Bilirubin: 0.7 mg/dL (ref 0.2–1.2)
Total Protein: 6.7 g/dL (ref 6.1–8.1)

## 2019-04-06 LAB — LIPID PANEL
Cholesterol: 210 mg/dL — ABNORMAL HIGH (ref <200)
HDL: 62 mg/dL (ref >=50)
LDL Cholesterol (Calc): 128 mg/dL (calc) — ABNORMAL HIGH
Non-HDL Cholesterol (Calc): 148 mg/dL (calc) — ABNORMAL HIGH (ref <130)
Total CHOL/HDL Ratio: 3.4 (calc) (ref <5.0)
Triglycerides: 96 mg/dL (ref <150)

## 2019-04-06 LAB — HEMOGLOBIN A1C
Hgb A1c MFr Bld: 6.6 % of total Hgb — ABNORMAL HIGH (ref <5.7)
Mean Plasma Glucose: 143 (calc)
eAG (mmol/L): 7.9 (calc)

## 2019-04-06 LAB — TSH: TSH: 1.33 mIU/L (ref 0.40–4.50)

## 2019-04-06 LAB — MAGNESIUM: Magnesium: 2.5 mg/dL (ref 1.5–2.5)

## 2019-05-09 ENCOUNTER — Other Ambulatory Visit: Payer: Medicare Other

## 2019-05-09 ENCOUNTER — Other Ambulatory Visit: Payer: Self-pay

## 2019-05-09 DIAGNOSIS — N183 Chronic kidney disease, stage 3 unspecified: Secondary | ICD-10-CM

## 2019-05-10 LAB — BASIC METABOLIC PANEL WITH GFR
BUN/Creatinine Ratio: 22 (calc) (ref 6–22)
BUN: 26 mg/dL — ABNORMAL HIGH (ref 7–25)
CO2: 27 mmol/L (ref 20–32)
Calcium: 9.9 mg/dL (ref 8.6–10.4)
Chloride: 102 mmol/L (ref 98–110)
Creat: 1.17 mg/dL — ABNORMAL HIGH (ref 0.60–0.88)
GFR, Est African American: 50 mL/min/{1.73_m2} — ABNORMAL LOW (ref >=60)
GFR, Est Non African American: 43 mL/min/{1.73_m2} — ABNORMAL LOW (ref >=60)
Glucose, Bld: 159 mg/dL — ABNORMAL HIGH (ref 65–99)
Potassium: 4.2 mmol/L (ref 3.5–5.3)
Sodium: 141 mmol/L (ref 135–146)

## 2019-06-06 ENCOUNTER — Other Ambulatory Visit: Payer: Self-pay | Admitting: General Practice

## 2019-06-06 ENCOUNTER — Other Ambulatory Visit: Payer: Self-pay

## 2019-06-06 ENCOUNTER — Encounter (INDEPENDENT_AMBULATORY_CARE_PROVIDER_SITE_OTHER): Payer: Self-pay

## 2019-06-06 ENCOUNTER — Encounter: Payer: Self-pay | Admitting: Physician Assistant

## 2019-06-06 ENCOUNTER — Other Ambulatory Visit: Payer: Self-pay | Admitting: *Deleted

## 2019-06-06 ENCOUNTER — Ambulatory Visit (INDEPENDENT_AMBULATORY_CARE_PROVIDER_SITE_OTHER): Payer: Medicare Other | Admitting: General Practice

## 2019-06-06 VITALS — BP 136/90 | HR 76 | Ht 62.0 in | Wt 163.0 lb

## 2019-06-06 DIAGNOSIS — I4819 Other persistent atrial fibrillation: Secondary | ICD-10-CM | POA: Diagnosis not present

## 2019-06-06 DIAGNOSIS — Z01810 Encounter for preprocedural cardiovascular examination: Secondary | ICD-10-CM | POA: Diagnosis not present

## 2019-06-06 NOTE — H&P (View-Only) (Signed)
Cardiology Clinic Note   Patient Name: Briana [name] Date of Encounter: 06/06/2019  Primary Care Provider:  Unk Pinto, MD Primary Cardiologist:  Shelva Majestic, MD  Patient Profile    Briana [name] 85 year old female presents today for 62-month  follow-up for her atrial fibrillation.  Past Medical History    Past Medical History:  Diagnosis Date  . Acute on chronic diastolic (congestive) heart failure ([place]) 11/21/2018  . Cancer ([place])    skin cancer  . DDD (degenerative disc disease)   . Diverticula of colon   . Diverticulitis   . DJD (degenerative joint disease)   . GERD (gastroesophageal reflux disease)    takes ranitidine  . Heart murmur   . History of hiatal hernia   . History of pneumonia   . Hx of irritable bowel syndrome   . Hyperlipidemia   . Occasional tremors   . Osteoporosis   . Pre-diabetes   . Varicose veins   . Vitamin D deficiency    Past Surgical History:  Procedure Laterality Date  . APPENDECTOMY    . BREAST SURGERY Right    fluid drained from breast  . CARDIOVERSION N/A 12/20/2018   Procedure: CARDIOVERSION;  Surgeon: Buford Dresser, MD;  Location: [facility] ENDOSCOPY;  Service: Cardiovascular;  Laterality: N/A;  . COLONOSCOPY    . ESOPHAGOGASTRODUODENOSCOPY    . JOINT REPLACEMENT Left    left hip  . LUMBAR LAMINECTOMY/DECOMPRESSION MICRODISCECTOMY N/A 06/05/2015   Procedure: L3-L5 DECOMPRESSION ;  Surgeon: Melina Schools, MD;  Location: [facility];  Service: Orthopedics;  Laterality: N/A;  . OPEN REDUCTION INTERNAL FIXATION (ORIF) DISTAL RADIAL FRACTURE Right 01/10/2014   Procedure: OPEN REDUCTION INTERNAL FIXATION (ORIF) DISTAL RADIAL FRACTURE;  Surgeon: Linna Hoff, MD;  Location: [place];  Service: Orthopedics;  Laterality: Right;  . SPINAL CORD DECOMPRESSION  06/05/2015   L3 L 5  . THYROID SURGERY    . TONSILLECTOMY    . TUBAL LIGATION    . varicose veins stripped      Allergies  Allergies  Allergen Reactions  . Accupril [Quinapril  Hcl] Cough  . Neosporin [Neomycin-Bacitracin Zn-Polymyx]     unknown  . Prednisone     Agitated and shaky  . Reglan [Metoclopramide] Other (See Comments)    tremors  . Tramadol Nausea And Vomiting  . Adhesive [Tape] Rash    History of Present Illness    Ms. Tillis was last seen by Almyra Deforest, PA on 12/31/2018.  With a follow-up EKG is identified that she had gone back into atrial fibrillation.  Prior to this visit she was diagnosed with a new atrial fibrillation in January 2020.  Her echocardiogram on 11/22/2018 showed an EF of 60 to 65% and she was also in acute congestive heart failure.  She was diuresed discharged home on Bumex, placed on Eliquis x3 days and received a successful DCCV on 12/20/2018.  With her transition back into atrial fibrillation she did notice slightly more shortness of breath but denied fatigue, palpitations, or chest discomfort.  Rate control therapy was discussed however, since she was somewhat asymptomatic at that time rate control was considered the best course of treatment.  She was placed on amiodarone 400 mg twice daily x1 week and then amiodarone was decreased to 200 mg daily.  TSH and liver function panel were drawn and were normal.  Follow-up appointment showed that she was tolerating amiodarone well, maintaining rate control, and still in atrial fibrillation.  She presents to the clinic today  with her daughter and states she feels well.  She does notice that when she walks down to her mailbox which she describes as "a long way" she is now having to stop 2-3 times to rest.  Prior to atrial fibrillation she did not have to stop during this activity.  Yesterday she states she walked 2.2 miles with frequent wet rest periods.  She states she does feel somewhat short of breath with activity now versus when she was not in atrial fibrillation.  Discussed with Dr. Claiborne Billings the need for repeat cardioversion, which he felt was a good option for her at this time.  Patient and family  talked about the decision and agreed that they were willing to proceed with DCCV.  She denies chest pain, increased shortness of breath, lower extremity edema, fatigue, weakness, presyncope, syncope, melena, hematuria, hemoptysis, orthopnea and PND.    Home Medications    Prior to Admission medications   Medication Sig Start Date End Date Taking? Authorizing Provider  acetaminophen (TYLENOL) 500 MG tablet Take 500-1,000 mg by mouth every 6 (six) hours as needed for moderate pain.    [provider]  albuterol (PROVENTIL HFA;VENTOLIN HFA) 108 (90 Base) MCG/ACT inhaler Use 2 inhalations 15-20 minutes apart every 4 hours as needed to rescue Asthma Patient taking differently: Inhale 2 puffs into the lungs every 4 (four) hours as needed for wheezing.  12/09/18   Unk Pinto, MD  amiodarone (PACERONE) 200 MG tablet Take 1 tablet (200 mg total) by mouth daily. 01/10/19   Almyra Deforest, PA  apixaban (ELIQUIS) 5 MG TABS tablet Take 1 tablet (5 mg total) by mouth 2 (two) times daily. 12/15/18   Kroeger, Lorelee Cover., PA-C  Ascorbic Acid (VITAMIN C) 1000 MG tablet Take 1,000 mg by mouth daily.     [provider]  aspirin EC 81 MG tablet Take 81 mg by mouth daily.    [provider]  atenolol (TENORMIN) 100 MG tablet Take 1 tablet every morning for BP & Tremor Patient taking differently: Take 100 mg by mouth daily.  09/16/18   Unk Pinto, MD  bumetanide Cleda Clarks) 1 MG tablet Take 1/2 to 1 tablet 2 x /day if needed for ankle swelling 12/16/18   Unk Pinto, MD  cholecalciferol (VITAMIN D3) 25 MCG (1000 UT) tablet Take 1,000 Units by mouth 2 (two) times daily.    [provider]  diltiazem (CARDIZEM CD) 120 MG 24 hr capsule Take 1 capsule (120 mg total) by mouth 2 (two) times daily. 04/05/19   [name], NP  fluticasone (FLONASE) 50 MCG/ACT nasal spray Place 2 sprays into both nostrils daily. Patient taking differently: Place 2 sprays into both nostrils daily as  needed for allergies.  02/28/18   [name], NP  Melatonin 10 MG CAPS Take 10-20 mg by mouth at bedtime as needed (sleep).    [provider]  Misc Natural Products (OSTEO BI-FLEX JOINT SHIELD PO) Take 1 tablet by mouth 2 (two) times daily.     [provider]  Multiple Vitamins-Minerals (PRESERVISION AREDS 2) CAPS Take 1 capsule by mouth 2 (two) times daily.    [provider]  Naphazoline-Pheniramine (OPCON-A) 0.027-0.315 % SOLN Place 1 drop into both eyes daily as needed (allergies).    [provider]  vitamin B-12 (CYANOCOBALAMIN) 500 MCG tablet Take 500 mcg by mouth daily.    [provider]    Family History    Family History  Problem Relation Age of Onset  .  Hypertension Mother   . Hyperlipidemia Mother   . Heart disease Sister   . Cancer Brother        prostate   She indicated that her mother is deceased. She indicated that her father is deceased. She indicated that her sister is deceased. She indicated that her brother is alive.  Social History    Social History   Socioeconomic History  . Marital status: Widowed    Spouse name: Not on file  . Number of children: Not on file  . Years of education: Not on file  . Highest education level: Not on file  Occupational History  . Not on file  Social Needs  . Financial resource strain: Not on file  . Food insecurity    Worry: Not on file    Inability: Not on file  . Transportation needs    Medical: Not on file    Non-medical: Not on file  Tobacco Use  . Smoking status: Never Smoker  . Smokeless tobacco: Never Used  Substance and Sexual Activity  . Alcohol use: No  . Drug use: No  . Sexual activity: Not on file  Lifestyle  . Physical activity    Days per week: Not on file    Minutes per session: Not on file  . Stress: Not on file  Relationships  . Social Herbalist on phone: Not on file    Gets together: Not on file    Attends religious service: Not on  file    Active member of club or organization: Not on file    Attends meetings of clubs or organizations: Not on file    Relationship status: Not on file  . Intimate partner violence    Fear of current or ex partner: Not on file    Emotionally abused: Not on file    Physically abused: Not on file    Forced sexual activity: Not on file  Other Topics Concern  . Not on file  Social History Narrative  . Not on file     Review of Systems    General:  No chills, fever, night sweats or weight changes.  Cardiovascular:  No chest pain, dyspnea on exertion, edema, orthopnea, palpitations, paroxysmal nocturnal dyspnea. Dermatological: No rash, lesions/masses Respiratory: No cough, dyspnea Urologic: No hematuria, dysuria Abdominal:   No nausea, vomiting, diarrhea, bright red blood per rectum, melena, or hematemesis Neurologic:  No visual changes, wkns, changes in mental status. All other systems reviewed and are otherwise negative except as noted above.  Physical Exam    VS:  There were no vitals taken for this visit. , BMI There is no height or weight on file to calculate BMI. GEN: Well nourished, well developed, in no acute distress. HEENT: normal. Neck: Supple, no JVD, carotid bruits, or masses. Cardiac: Irregularly irregular, no murmurs, rubs, or gallops. No clubbing, cyanosis, edema.  Radials/DP/PT 2+ and equal bilaterally.  Respiratory:  Respirations regular and unlabored, clear to auscultation bilaterally. GI: Soft, nontender, nondistended, BS + x 4. MS: no deformity or atrophy. Skin: warm and dry, no rash. Neuro:  Strength and sensation are intact. Psych: Normal affect.  Accessory Clinical Findings    ECG personally reviewed by me today-atrial fibrillation 76 bpm- No acute changes  EKG 01/10/2019: Atrial fibrillation 74 bpm  EKG 12/28/2018: Atrial fibrillation 111 bpm  EKG 12/20/2018: Sinus bradycardia 58 bpm  Echocardiogram 11/22/2018: Study Conclusions  - Procedure  narrative: Transthoracic echocardiography. Image   quality was adequate.  The study was technically difficult. - Left ventricle: The cavity size was normal. Wall thickness was   increased in a pattern of mild LVH. Systolic function was normal.   The estimated ejection fraction was in the range of 60% to 65%.   Wall motion was normal; there were no regional wall motion   abnormalities. The study was not technically sufficient to allow   evaluation of LV diastolic dysfunction due to atrial   fibrillation. - Aortic valve: There was trivial regurgitation directed   eccentrically in the LVOT and towards the mitral anterior   leaflet. - Left atrium: The atrium was mildly dilated. - Pulmonic valve: There was trivial regurgitation. - Pericardium, extracardiac: A trivial, free-flowing pericardial   effusion was identified circumferential to the heart.   Assessment & Plan   1.  Persistent atrial fibrillation-EKG 06/06/2019 atrial fibrillation 76 bpm no acute changes.  Discussed with Dr. Claiborne Billings DOD and family option of repeat DCCV.  All agreed that this was the best course of treatment and agreed to proceed. Continue amiodarone 200 mg tablet daily Continue apixaban 5 mg tablet twice daily Continue atenolol 100 mg tablet daily Continue Bumex half to 1 tablet twice daily as needed Continue diltiazem 120 mg twice daily Schedule DCCV Maintain physical activity as tolerated Low-sodium heart healthy diet- educated about low-sodium food choices Order BMP and CBC preprocedure.  2.  Hyperlipidemia-04/05/2019: Cholesterol 210; HDL 62; LDL Cholesterol (Calc) 128; Triglycerides 96 Not currently on statin medication Continue heart healthy low-sodium diet Maintain physical activity Monitored by PCP  3.  Essential hypertension- well-controlled today Continue atenolol 100 mg tablet daily Heart healthy low-sodium diet Maintain physical activity  4.  Chronic diastolic congestive heart failure-  echocardiogram 0/04/2375 diastolic dysfunction due to atrial fibrillation.  Euvolemic today Continue Bumex half tablet to 1 tablet twice daily as needed for ankle swelling Heart healthy low-sodium diet Maintain physical activity as needed   Disposition: Follow-up with Dr. Claiborne Billings 2 weeks status post [place], NP 06/06/2019, 10:32 AM

## 2019-06-06 NOTE — Addendum Note (Signed)
Addended by: Jacqulynn Cadet on: 06/06/2019 12:59 PM   Modules accepted: Miquel Dunn

## 2019-06-06 NOTE — Patient Instructions (Signed)
Medication Instructions:  Your physician recommends that you continue on your current medications as directed. Please refer to the Current Medication list given to you today.  If you need a refill on your cardiac medications before your next appointment, please call your pharmacy.   Lab work: You will need to have labs (blood work) drawn 1 week prior to your Cardioversion:  BMET  CBC  If you have labs (blood work) drawn today and your tests are completely normal, you will receive your results only by: Marland Kitchen MyChart Message (if you have MyChart) OR . A paper copy in the mail If you have any lab test that is abnormal or we need to change your treatment, we will call you to review the results.  Testing/Procedures: Your physician has recommended that you have a Cardioversion (DCCV). Electrical Cardioversion uses a jolt of electricity to your heart either through paddles or wired patches attached to your chest. This is a controlled, usually prescheduled, procedure. Defibrillation is done under light anesthesia in the hospital, and you usually go home the day of the procedure. This is done to get your heart back into a normal rhythm. You are not awake for the procedure. Please see the instruction sheet given to you today.   Follow-Up: At [facility], you and your health needs are our priority.  As part of our continuing mission to provide you with exceptional heart care, we have created designated Provider Care Teams.  These Care Teams include your primary Cardiologist (physician) and Advanced Practice Providers (APPs -  Physician Assistants and Nurse Practitioners) who all work together to provide you with the care you need, when you need it. . You will need a follow up appointment in 2 weeks AFTER YOUR CARDIOVERSION with Shelva Majestic, MD   Any Other Special Instructions Will Be Listed Below (If Applicable).   Dear Briana [name], You are scheduled for a Cardioversionon Friday June 16, 2019 with  Dr. Harrington Challenger.  Please arrive at the [facility] (Main Entrance A) at [facility]: [address] [place], [place] [address] at 8:00am. (1 hour prior to procedure unless lab work is needed; if lab work is needed arrive 1.5 hours ahead)  DIET: Nothing to eat or drink after midnight except a sip of water with medications (see medication instructions below   Labs: If patient is on Coumadin, patient needs pt/INR, CBC, BMET within 3 days (No pt/INR needed for patients taking Xarelto, Eliquis, Pradaxa) For patients receiving anesthesia for TEE and all Cardioversion patients: BMET, CBC within 1 week  Come to the lab at [address] between the hours of 8:00 am and 4:30 pm. You do not   You must have a responsible person to drive you home and stay in the waiting area during your procedure. Failure to do so could result in cancellation.  Bring your insurance cards.  *Special Note: Every effort is made to have your procedure done on time. Occasionally there are emergencies that occur at the hospital that may cause delays. Please be patient if a delay does occur.

## 2019-06-06 NOTE — Progress Notes (Signed)
Cardiology Clinic Note   Patient Name: Briana [name] Date of Encounter: 06/06/2019  Primary Care Provider:  Unk Pinto, MD Primary Cardiologist:  Shelva Majestic, MD  Patient Profile    Briana [name] 85 year old female presents today for 28-month  follow-up for her atrial fibrillation.  Past Medical History    Past Medical History:  Diagnosis Date  . Acute on chronic diastolic (congestive) heart failure ([place]) 11/21/2018  . Cancer ([place])    skin cancer  . DDD (degenerative disc disease)   . Diverticula of colon   . Diverticulitis   . DJD (degenerative joint disease)   . GERD (gastroesophageal reflux disease)    takes ranitidine  . Heart murmur   . History of hiatal hernia   . History of pneumonia   . Hx of irritable bowel syndrome   . Hyperlipidemia   . Occasional tremors   . Osteoporosis   . Pre-diabetes   . Varicose veins   . Vitamin D deficiency    Past Surgical History:  Procedure Laterality Date  . APPENDECTOMY    . BREAST SURGERY Right    fluid drained from breast  . CARDIOVERSION N/A 12/20/2018   Procedure: CARDIOVERSION;  Surgeon: Buford Dresser, MD;  Location: [facility] ENDOSCOPY;  Service: Cardiovascular;  Laterality: N/A;  . COLONOSCOPY    . ESOPHAGOGASTRODUODENOSCOPY    . JOINT REPLACEMENT Left    left hip  . LUMBAR LAMINECTOMY/DECOMPRESSION MICRODISCECTOMY N/A 06/05/2015   Procedure: L3-L5 DECOMPRESSION ;  Surgeon: Melina Schools, MD;  Location: [place];  Service: Orthopedics;  Laterality: N/A;  . OPEN REDUCTION INTERNAL FIXATION (ORIF) DISTAL RADIAL FRACTURE Right 01/10/2014   Procedure: OPEN REDUCTION INTERNAL FIXATION (ORIF) DISTAL RADIAL FRACTURE;  Surgeon: Linna Hoff, MD;  Location: [place];  Service: Orthopedics;  Laterality: Right;  . SPINAL CORD DECOMPRESSION  06/05/2015   L3 L 5  . THYROID SURGERY    . TONSILLECTOMY    . TUBAL LIGATION    . varicose veins stripped      Allergies  Allergies  Allergen Reactions  . Accupril [Quinapril  Hcl] Cough  . Neosporin [Neomycin-Bacitracin Zn-Polymyx]     unknown  . Prednisone     Agitated and shaky  . Reglan [Metoclopramide] Other (See Comments)    tremors  . Tramadol Nausea And Vomiting  . Adhesive [Tape] Rash    History of Present Illness    [name] was last seen by Almyra Deforest, PA on 12/31/2018.  With a follow-up EKG is identified that she had gone back into atrial fibrillation.  Prior to this visit she was diagnosed with a new atrial fibrillation in January 2020.  Her echocardiogram on 11/22/2018 showed an EF of 60 to 65% and she was also in acute congestive heart failure.  She was diuresed discharged home on Bumex, placed on Eliquis x3 days and received a successful DCCV on 12/20/2018.  With her transition back into atrial fibrillation she did notice slightly more shortness of breath but denied fatigue, palpitations, or chest discomfort.  Rate control therapy was discussed however, since she was somewhat asymptomatic at that time rate control was considered the best course of treatment.  She was placed on amiodarone 400 mg twice daily x1 week and then amiodarone was decreased to 200 mg daily.  TSH and liver function panel were drawn and were normal.  Follow-up appointment showed that she was tolerating amiodarone well, maintaining rate control, and still in atrial fibrillation.  She presents to the clinic today  with her daughter and states she feels well.  She does notice that when she walks down to her mailbox which she describes as "a long way" she is now having to stop 2-3 times to rest.  Prior to atrial fibrillation she did not have to stop during this activity.  Yesterday she states she walked 2.2 miles with frequent wet rest periods.  She states she does feel somewhat short of breath with activity now versus when she was not in atrial fibrillation.  Discussed with Dr. Claiborne Billings the need for repeat cardioversion, which he felt was a good option for her at this time.  Patient and family  talked about the decision and agreed that they were willing to proceed with DCCV.  She denies chest pain, increased shortness of breath, lower extremity edema, fatigue, weakness, presyncope, syncope, melena, hematuria, hemoptysis, orthopnea and PND.    Home Medications    Prior to Admission medications   Medication Sig Start Date End Date Taking? Authorizing Provider  acetaminophen (TYLENOL) 500 MG tablet Take 500-1,000 mg by mouth every 6 (six) hours as needed for moderate pain.    [provider]  albuterol (PROVENTIL HFA;VENTOLIN HFA) 108 (90 Base) MCG/ACT inhaler Use 2 inhalations 15-20 minutes apart every 4 hours as needed to rescue Asthma Patient taking differently: Inhale 2 puffs into the lungs every 4 (four) hours as needed for wheezing.  12/09/18   Unk Pinto, MD  amiodarone (PACERONE) 200 MG tablet Take 1 tablet (200 mg total) by mouth daily. 01/10/19   Almyra Deforest, PA  apixaban (ELIQUIS) 5 MG TABS tablet Take 1 tablet (5 mg total) by mouth 2 (two) times daily. 12/15/18   Kroeger, Lorelee Cover., PA-C  Ascorbic Acid (VITAMIN C) 1000 MG tablet Take 1,000 mg by mouth daily.     [provider]  aspirin EC 81 MG tablet Take 81 mg by mouth daily.    [provider]  atenolol (TENORMIN) 100 MG tablet Take 1 tablet every morning for BP & Tremor Patient taking differently: Take 100 mg by mouth daily.  09/16/18   Unk Pinto, MD  bumetanide Cleda Clarks) 1 MG tablet Take 1/2 to 1 tablet 2 x /day if needed for ankle swelling 12/16/18   Unk Pinto, MD  cholecalciferol (VITAMIN D3) 25 MCG (1000 UT) tablet Take 1,000 Units by mouth 2 (two) times daily.    [provider]  diltiazem (CARDIZEM CD) 120 MG 24 hr capsule Take 1 capsule (120 mg total) by mouth 2 (two) times daily. 04/05/19   [name], NP  fluticasone (FLONASE) 50 MCG/ACT nasal spray Place 2 sprays into both nostrils daily. Patient taking differently: Place 2 sprays into both nostrils daily as  needed for allergies.  02/28/18   [name], NP  Melatonin 10 MG CAPS Take 10-20 mg by mouth at bedtime as needed (sleep).    [provider]  Misc Natural Products (OSTEO BI-FLEX JOINT SHIELD PO) Take 1 tablet by mouth 2 (two) times daily.     [provider]  Multiple Vitamins-Minerals (PRESERVISION AREDS 2) CAPS Take 1 capsule by mouth 2 (two) times daily.    [provider]  Naphazoline-Pheniramine (OPCON-A) 0.027-0.315 % SOLN Place 1 drop into both eyes daily as needed (allergies).    [provider]  vitamin B-12 (CYANOCOBALAMIN) 500 MCG tablet Take 500 mcg by mouth daily.    [provider]    Family History    Family History  Problem Relation Age of Onset  .  Hypertension Mother   . Hyperlipidemia Mother   . Heart disease Sister   . Cancer Brother        prostate   She indicated that her mother is deceased. She indicated that her father is deceased. She indicated that her sister is deceased. She indicated that her brother is alive.  Social History    Social History   Socioeconomic History  . Marital status: Widowed    Spouse name: Not on file  . Number of children: Not on file  . Years of education: Not on file  . Highest education level: Not on file  Occupational History  . Not on file  Social Needs  . Financial resource strain: Not on file  . Food insecurity    Worry: Not on file    Inability: Not on file  . Transportation needs    Medical: Not on file    Non-medical: Not on file  Tobacco Use  . Smoking status: Never Smoker  . Smokeless tobacco: Never Used  Substance and Sexual Activity  . Alcohol use: No  . Drug use: No  . Sexual activity: Not on file  Lifestyle  . Physical activity    Days per week: Not on file    Minutes per session: Not on file  . Stress: Not on file  Relationships  . Social Herbalist on phone: Not on file    Gets together: Not on file    Attends religious service: Not on  file    Active member of club or organization: Not on file    Attends meetings of clubs or organizations: Not on file    Relationship status: Not on file  . Intimate partner violence    Fear of current or ex partner: Not on file    Emotionally abused: Not on file    Physically abused: Not on file    Forced sexual activity: Not on file  Other Topics Concern  . Not on file  Social History Narrative  . Not on file     Review of Systems    General:  No chills, fever, night sweats or weight changes.  Cardiovascular:  No chest pain, dyspnea on exertion, edema, orthopnea, palpitations, paroxysmal nocturnal dyspnea. Dermatological: No rash, lesions/masses Respiratory: No cough, dyspnea Urologic: No hematuria, dysuria Abdominal:   No nausea, vomiting, diarrhea, bright red blood per rectum, melena, or hematemesis Neurologic:  No visual changes, wkns, changes in mental status. All other systems reviewed and are otherwise negative except as noted above.  Physical Exam    VS:  There were no vitals taken for this visit. , BMI There is no height or weight on file to calculate BMI. GEN: Well nourished, well developed, in no acute distress. HEENT: normal. Neck: Supple, no JVD, carotid bruits, or masses. Cardiac: Irregularly irregular, no murmurs, rubs, or gallops. No clubbing, cyanosis, edema.  Radials/DP/PT 2+ and equal bilaterally.  Respiratory:  Respirations regular and unlabored, clear to auscultation bilaterally. GI: Soft, nontender, nondistended, BS + x 4. MS: no deformity or atrophy. Skin: warm and dry, no rash. Neuro:  Strength and sensation are intact. Psych: Normal affect.  Accessory Clinical Findings    ECG personally reviewed by me today-atrial fibrillation 76 bpm- No acute changes  EKG 01/10/2019: Atrial fibrillation 74 bpm  EKG 12/28/2018: Atrial fibrillation 111 bpm  EKG 12/20/2018: Sinus bradycardia 58 bpm  Echocardiogram 11/22/2018: Study Conclusions  - Procedure  narrative: Transthoracic echocardiography. Image   quality was adequate.  The study was technically difficult. - Left ventricle: The cavity size was normal. Wall thickness was   increased in a pattern of mild LVH. Systolic function was normal.   The estimated ejection fraction was in the range of 60% to 65%.   Wall motion was normal; there were no regional wall motion   abnormalities. The study was not technically sufficient to allow   evaluation of LV diastolic dysfunction due to atrial   fibrillation. - Aortic valve: There was trivial regurgitation directed   eccentrically in the LVOT and towards the mitral anterior   leaflet. - Left atrium: The atrium was mildly dilated. - Pulmonic valve: There was trivial regurgitation. - Pericardium, extracardiac: A trivial, free-flowing pericardial   effusion was identified circumferential to the heart.   Assessment & Plan   1.  Persistent atrial fibrillation-EKG 06/06/2019 atrial fibrillation 76 bpm no acute changes.  Discussed with Dr. Claiborne Billings DOD and family option of repeat DCCV.  All agreed that this was the best course of treatment and agreed to proceed. Continue amiodarone 200 mg tablet daily Continue apixaban 5 mg tablet twice daily Continue atenolol 100 mg tablet daily Continue Bumex half to 1 tablet twice daily as needed Continue diltiazem 120 mg twice daily Schedule DCCV Maintain physical activity as tolerated Low-sodium heart healthy diet- educated about low-sodium food choices Order BMP and CBC preprocedure.  2.  Hyperlipidemia-04/05/2019: Cholesterol 210; HDL 62; LDL Cholesterol (Calc) 128; Triglycerides 96 Not currently on statin medication Continue heart healthy low-sodium diet Maintain physical activity Monitored by PCP  3.  Essential hypertension- well-controlled today Continue atenolol 100 mg tablet daily Heart healthy low-sodium diet Maintain physical activity  4.  Chronic diastolic congestive heart failure-  echocardiogram 04/23/5915 diastolic dysfunction due to atrial fibrillation.  Euvolemic today Continue Bumex half tablet to 1 tablet twice daily as needed for ankle swelling Heart healthy low-sodium diet Maintain physical activity as needed   Disposition: Follow-up with Dr. Claiborne Billings 2 weeks status post [place], NP 06/06/2019, 10:32 AM

## 2019-06-09 DIAGNOSIS — Z01810 Encounter for preprocedural cardiovascular examination: Secondary | ICD-10-CM | POA: Diagnosis not present

## 2019-06-09 LAB — BASIC METABOLIC PANEL
BUN/Creatinine Ratio: 19 (ref 12–28)
BUN: 22 mg/dL (ref 8–27)
CO2: 26 mmol/L (ref 20–29)
Calcium: 9.8 mg/dL (ref 8.7–10.3)
Chloride: 101 mmol/L (ref 96–106)
Creatinine, Ser: 1.18 mg/dL — ABNORMAL HIGH (ref 0.57–1.00)
GFR calc Af Amer: 49 mL/min/{1.73_m2} — ABNORMAL LOW (ref >59)
GFR calc non Af Amer: 43 mL/min/{1.73_m2} — ABNORMAL LOW (ref >59)
Glucose: 99 mg/dL (ref 65–99)
Potassium: 4.4 mmol/L (ref 3.5–5.2)
Sodium: 143 mmol/L (ref 134–144)

## 2019-06-09 LAB — CBC
Hematocrit: 42.1 % (ref 34.0–46.6)
Hemoglobin: 14.3 g/dL (ref 11.1–15.9)
MCH: 30.3 pg (ref 26.6–33.0)
MCHC: 34 g/dL (ref 31.5–35.7)
MCV: 89 fL (ref 79–97)
Platelets: 221 10*3/uL (ref 150–450)
RBC: 4.72 x10E6/uL (ref 3.77–5.28)
RDW: 12.8 % (ref 11.7–15.4)
WBC: 6.2 10*3/uL (ref 3.4–10.8)

## 2019-06-13 ENCOUNTER — Other Ambulatory Visit (HOSPITAL_COMMUNITY)
Admission: RE | Admit: 2019-06-13 | Discharge: 2019-06-13 | Disposition: A | Discharge status: Home / Self Care | Payer: Medicare Other | Source: Ambulatory Visit | Attending: Internal Medicine | Admitting: Internal Medicine

## 2019-06-13 DIAGNOSIS — Z20828 Contact with and (suspected) exposure to other viral communicable diseases: Secondary | ICD-10-CM | POA: Diagnosis present

## 2019-06-13 LAB — SARS CORONAVIRUS 2 (TAT 6-24 HRS): SARS Coronavirus 2: NEGATIVE

## 2019-06-16 ENCOUNTER — Ambulatory Visit (HOSPITAL_COMMUNITY): Payer: Medicare Other | Admitting: Certified Registered Nurse Anesthetist

## 2019-06-16 ENCOUNTER — Other Ambulatory Visit: Payer: Self-pay

## 2019-06-16 ENCOUNTER — Ambulatory Visit (HOSPITAL_COMMUNITY)
Admission: RE | Admit: 2019-06-16 | Discharge: 2019-06-16 | Disposition: A | Discharge status: Home / Self Care | Payer: Medicare Other | Attending: Internal Medicine | Admitting: Internal Medicine

## 2019-06-16 ENCOUNTER — Encounter (HOSPITAL_COMMUNITY)
Admission: RE | Disposition: A | Discharge status: Home / Self Care | Payer: Self-pay | Source: Home / Self Care | Attending: Internal Medicine

## 2019-06-16 DIAGNOSIS — E559 Vitamin D deficiency, unspecified: Secondary | ICD-10-CM | POA: Diagnosis not present

## 2019-06-16 DIAGNOSIS — Z888 Allergy status to other drugs, medicaments and biological substances status: Secondary | ICD-10-CM | POA: Insufficient documentation

## 2019-06-16 DIAGNOSIS — I4891 Unspecified atrial fibrillation: Secondary | ICD-10-CM | POA: Diagnosis not present

## 2019-06-16 DIAGNOSIS — I4819 Other persistent atrial fibrillation: Secondary | ICD-10-CM | POA: Diagnosis not present

## 2019-06-16 DIAGNOSIS — M810 Age-related osteoporosis without current pathological fracture: Secondary | ICD-10-CM | POA: Diagnosis not present

## 2019-06-16 DIAGNOSIS — M1990 Unspecified osteoarthritis, unspecified site: Secondary | ICD-10-CM | POA: Insufficient documentation

## 2019-06-16 DIAGNOSIS — I110 Hypertensive heart disease with heart failure: Secondary | ICD-10-CM | POA: Diagnosis not present

## 2019-06-16 DIAGNOSIS — I5032 Chronic diastolic (congestive) heart failure: Secondary | ICD-10-CM | POA: Insufficient documentation

## 2019-06-16 DIAGNOSIS — Z96642 Presence of left artificial hip joint: Secondary | ICD-10-CM | POA: Insufficient documentation

## 2019-06-16 DIAGNOSIS — E785 Hyperlipidemia, unspecified: Secondary | ICD-10-CM | POA: Insufficient documentation

## 2019-06-16 DIAGNOSIS — Z79899 Other long term (current) drug therapy: Secondary | ICD-10-CM | POA: Insufficient documentation

## 2019-06-16 DIAGNOSIS — Z7901 Long term (current) use of anticoagulants: Secondary | ICD-10-CM | POA: Insufficient documentation

## 2019-06-16 DIAGNOSIS — K219 Gastro-esophageal reflux disease without esophagitis: Secondary | ICD-10-CM | POA: Insufficient documentation

## 2019-06-16 DIAGNOSIS — Z9851 Tubal ligation status: Secondary | ICD-10-CM | POA: Diagnosis not present

## 2019-06-16 DIAGNOSIS — N183 Chronic kidney disease, stage 3 (moderate): Secondary | ICD-10-CM | POA: Diagnosis not present

## 2019-06-16 DIAGNOSIS — Z8249 Family history of ischemic heart disease and other diseases of the circulatory system: Secondary | ICD-10-CM | POA: Diagnosis not present

## 2019-06-16 DIAGNOSIS — K589 Irritable bowel syndrome without diarrhea: Secondary | ICD-10-CM | POA: Diagnosis not present

## 2019-06-16 DIAGNOSIS — Z7982 Long term (current) use of aspirin: Secondary | ICD-10-CM | POA: Insufficient documentation

## 2019-06-16 DIAGNOSIS — R7303 Prediabetes: Secondary | ICD-10-CM | POA: Diagnosis not present

## 2019-06-16 DIAGNOSIS — I130 Hypertensive heart and chronic kidney disease with heart failure and stage 1 through stage 4 chronic kidney disease, or unspecified chronic kidney disease: Secondary | ICD-10-CM | POA: Diagnosis not present

## 2019-06-16 HISTORY — PX: CARDIOVERSION: SHX1299

## 2019-06-16 SURGERY — CARDIOVERSION
Anesthesia: General

## 2019-06-16 MED ORDER — LIDOCAINE 2% (20 MG/ML) 5 ML SYRINGE
INTRAMUSCULAR | Status: DC | PRN
  Administered 2019-06-16: 20 mg via INTRAVENOUS

## 2019-06-16 MED ORDER — PROPOFOL 10 MG/ML IV BOLUS
INTRAVENOUS | Status: DC | PRN
  Administered 2019-06-16: 40 mg via INTRAVENOUS

## 2019-06-16 MED ORDER — SODIUM CHLORIDE 0.9 % IV SOLN
INTRAVENOUS | Status: DC
  Administered 2019-06-16: 08:00:00 via INTRAVENOUS

## 2019-06-16 NOTE — Transfer of Care (Signed)
Immediate Anesthesia Transfer of Care Note  Patient: Briana [name]  Procedure(s) Performed: CARDIOVERSION (N/A )  Patient Location: PACU  Anesthesia Type:MAC  Level of Consciousness: drowsy  Airway & Oxygen Therapy: Patient Spontanous Breathing  Post-op Assessment: Report given to RN and Post -op Vital signs reviewed and stable  Post vital signs: Reviewed and stable  Last Vitals:  Vitals Value Taken Time  BP 108/65   Temp    Pulse 43   Resp 16   SpO2 95     Last Pain:  Vitals:   06/16/19 0817  TempSrc: Oral  PainSc: 0-No pain         Complications: No apparent anesthesia complications

## 2019-06-16 NOTE — Anesthesia Preprocedure Evaluation (Signed)
Anesthesia Evaluation  Patient identified by MRN, date of birth, ID band Patient awake    Reviewed: Allergy & Precautions, NPO status , Patient's Chart, lab work & pertinent test results  Airway Mallampati: II  TM Distance: >3 FB     Dental  (+) Dental Advisory Given   Pulmonary neg pulmonary ROS,    breath sounds clear to auscultation       Cardiovascular hypertension, +CHF  + dysrhythmias + Valvular Problems/Murmurs  Rhythm:Regular Rate:Normal     Neuro/Psych negative neurological ROS     GI/Hepatic Neg liver ROS, hiatal hernia, GERD  ,  Endo/Other  negative endocrine ROS  Renal/GU Renal disease     Musculoskeletal  (+) Arthritis ,   Abdominal   Peds  Hematology negative hematology ROS (+)   Anesthesia Other Findings   Reproductive/Obstetrics                             Anesthesia Physical Anesthesia Plan  ASA: III  Anesthesia Plan: General   Post-op Pain Management:    Induction: Intravenous  PONV Risk Score and Plan: Treatment may vary due to age or medical condition  Airway Management Planned: Natural Airway and Mask  Additional Equipment:   Intra-op Plan:   Post-operative Plan:   Informed Consent: I have reviewed the patients History and Physical, chart, labs and discussed the procedure including the risks, benefits and alternatives for the proposed anesthesia with the patient or authorized representative who has indicated his/her understanding and acceptance.       Plan Discussed with:   Anesthesia Plan Comments:         Anesthesia Quick Evaluation

## 2019-06-16 NOTE — Interval H&P Note (Signed)
History and Physical Interval Note:  06/16/2019 8:29 AM  Briana [name]  has presented today for surgery, with the diagnosis of AFIB.  The various methods of treatment have been discussed with the patient and family. After consideration of risks, benefits and other options for treatment, the patient has consented to  Procedure(s): CARDIOVERSION (N/A) as a surgical intervention.  The patient's history has been reviewed, patient examined, no change in status, stable for surgery.  I have reviewed the patient's chart and labs.  Questions were answered to the patient's satisfaction.     Dorris Carnes

## 2019-06-16 NOTE — Anesthesia Postprocedure Evaluation (Signed)
Anesthesia Post Note  Patient: Briana [name]  Procedure(s) Performed: CARDIOVERSION (N/A )     Anesthesia Post Evaluation  Last Vitals:  Vitals:   06/16/19 0844 06/16/19 0854  BP: (!) 121/56 118/60  Pulse: (!) 41 (!) 46  Resp: (!) 22 15  Temp:    SpO2: 93% 93%    Last Pain:  Vitals:   06/16/19 0841  TempSrc: Temporal  PainSc:                  Tiajuana Amass

## 2019-06-16 NOTE — CV Procedure (Signed)
Cardioversion  Patient sedated by anesthesia with propofol and lidocaine intravenously With pads in AP position, patient cardioverted to SR with 200 J synchronized biphasic energy  Procedure without complication  Dorris Carnes MD

## 2019-06-18 ENCOUNTER — Encounter (HOSPITAL_COMMUNITY): Payer: Self-pay | Admitting: Internal Medicine

## 2019-06-22 ENCOUNTER — Telehealth: Payer: Self-pay | Admitting: Cardiovascular Disease

## 2019-06-22 NOTE — Telephone Encounter (Signed)
The patient called in with complaints of a low heart rate and feelings of fatigue, light headedness and dizziness when she ambulates.   Blood pressure this morning was 118/66 with a heart rate of 46. Yesterday it was 126/71 with a heart rate of 47.  Since the cardioversion on 7/31, the patient's heart rate has been in the 40's to low 50's.   She currently takes amiodarone 200 mg once daily and Atenolol 50 mg once daily. The diltiazem and atenolol were discontinued after the cardioversion. The patient stated that she was told after the discharge to stay on Atenolol 50 mg once daily.  Per DOD, the patient should discontinue the Atenolol. She will keep a log of her blood pressures and heart rate and call back if it is not any better.

## 2019-06-22 NOTE — Telephone Encounter (Signed)
Noted if patient calls back with increased blood pressures.

## 2019-06-22 NOTE — Telephone Encounter (Signed)
New Message   STAT if HR is under 50 or over 120 (normal HR is 60-100 beats per minute)  1) What is your heart rate? 46  2) Do you have a log of your heart rate readings (document readings)? 46, 47, 47, 46, 56, and 51( last 5 days)  3) Do you have any other symptoms? With exertion SOB and lightheadedness

## 2019-06-22 NOTE — Telephone Encounter (Signed)
Grade, however if blood pressure increases and pulse rate increases may need to resume atenolol at 12.5 mg daily

## 2019-06-22 NOTE — Telephone Encounter (Signed)
Noted. Thanks.

## 2019-06-30 ENCOUNTER — Encounter: Payer: Self-pay | Admitting: Physician Assistant

## 2019-06-30 ENCOUNTER — Ambulatory Visit (INDEPENDENT_AMBULATORY_CARE_PROVIDER_SITE_OTHER): Payer: Medicare Other | Admitting: General Practice

## 2019-06-30 ENCOUNTER — Other Ambulatory Visit: Payer: Self-pay

## 2019-06-30 VITALS — BP 168/86 | HR 77 | Ht 62.0 in | Wt 160.8 lb

## 2019-06-30 DIAGNOSIS — E785 Hyperlipidemia, unspecified: Secondary | ICD-10-CM

## 2019-06-30 DIAGNOSIS — I5032 Chronic diastolic (congestive) heart failure: Secondary | ICD-10-CM | POA: Diagnosis not present

## 2019-06-30 DIAGNOSIS — S9032XA Contusion of left foot, initial encounter: Secondary | ICD-10-CM

## 2019-06-30 DIAGNOSIS — I10 Essential (primary) hypertension: Secondary | ICD-10-CM | POA: Diagnosis not present

## 2019-06-30 DIAGNOSIS — I4819 Other persistent atrial fibrillation: Secondary | ICD-10-CM

## 2019-06-30 MED ORDER — ATENOLOL 25 MG PO TABS: 25.0000 mg | ORAL_TABLET | Freq: Every day | ORAL | 0 refills | Status: DC

## 2019-06-30 NOTE — Patient Instructions (Addendum)
Medication Instructions:   START Atenolol 25 mg daily  If you need a refill on your cardiac medications before your next appointment, please call your pharmacy.   Lab work: NONE ordered at this time of appointment    If you have labs (blood work) drawn today and your tests are completely normal, you will receive your results only by: Marland Kitchen MyChart Message (if you have MyChart) OR . A paper copy in the mail If you have any lab test that is abnormal or we need to change your treatment, we will call you to review the results.  Testing/Procedures: NONE ordered at this time of appointment   Follow-Up: At [facility], you and your health needs are our priority.  As part of our continuing mission to provide you with exceptional heart care, we have created designated Provider Care Teams.  These Care Teams include your primary Cardiologist (physician) and Advanced Practice Providers (APPs -  Physician Assistants and Nurse Practitioners) who all work together to provide you with the care you need, when you need it. . Your physician recommends that you schedule a follow-up appointment in: 2-3 weeks with Briana Deforest, PA-C    Any Other Special Instructions Will Be Listed Below (If Applicable).

## 2019-06-30 NOTE — Progress Notes (Signed)
Cardiology Clinic Note   Patient Name: Briana [name] Date of Encounter: 06/30/2019  Primary Care Provider:  Unk Pinto, MD Primary Cardiologist:  Shelva Majestic, MD  Patient Profile    Briana [name] 85 year old female presents today status post successful DCCV.  Past Medical History    Past Medical History:  Diagnosis Date  . Acute on chronic diastolic (congestive) heart failure ([place]) 11/21/2018  . Cancer ([place])    skin cancer  . DDD (degenerative disc disease)   . Diverticula of colon   . Diverticulitis   . DJD (degenerative joint disease)   . GERD (gastroesophageal reflux disease)    takes ranitidine  . Heart murmur   . History of hiatal hernia   . History of pneumonia   . Hx of irritable bowel syndrome   . Hyperlipidemia   . Occasional tremors   . Osteoporosis   . Pre-diabetes   . Varicose veins   . Vitamin D deficiency    Past Surgical History:  Procedure Laterality Date  . APPENDECTOMY    . BREAST SURGERY Right    fluid drained from breast  . CARDIOVERSION N/A 12/20/2018   Procedure: CARDIOVERSION;  Surgeon: Buford Dresser, MD;  Location: [facility] ENDOSCOPY;  Service: Cardiovascular;  Laterality: N/A;  . CARDIOVERSION N/A 06/16/2019   Procedure: CARDIOVERSION;  Surgeon: Fay Records, MD;  Location: [place];  Service: Cardiovascular;  Laterality: N/A;  . COLONOSCOPY    . ESOPHAGOGASTRODUODENOSCOPY    . JOINT REPLACEMENT Left    left hip  . LUMBAR LAMINECTOMY/DECOMPRESSION MICRODISCECTOMY N/A 06/05/2015   Procedure: L3-L5 DECOMPRESSION ;  Surgeon: Melina Schools, MD;  Location: [place];  Service: Orthopedics;  Laterality: N/A;  . OPEN REDUCTION INTERNAL FIXATION (ORIF) DISTAL RADIAL FRACTURE Right 01/10/2014   Procedure: OPEN REDUCTION INTERNAL FIXATION (ORIF) DISTAL RADIAL FRACTURE;  Surgeon: Linna Hoff, MD;  Location: [place];  Service: Orthopedics;  Laterality: Right;  . SPINAL CORD DECOMPRESSION  06/05/2015   L3 L 5  . THYROID SURGERY    .  TONSILLECTOMY    . TUBAL LIGATION    . varicose veins stripped      Allergies  Allergies  Allergen Reactions  . Accupril [Quinapril Hcl] Cough  . Neosporin [Neomycin-Bacitracin Zn-Polymyx]     unknown  . Prednisone     Agitated and shaky  . Reglan [Metoclopramide] Other (See Comments)    tremors  . Tramadol Nausea And Vomiting  . Adhesive [Tape] Rash    History of Present Illness    Briana [name] was last seen by me on 06/06/2019 during that time she stated she felt well however, she was having increased exercise intolerance.  She was found to be in rate controlled atrial fibrillation.  She underwent DCCV on 06/16/2019 which was successful with 1 shock at 200 J.  However, she became bradycardic and her atenolol and diltiazem were stopped.  She was diagnosed with new onset atrial fibrillation in January 2020.  Her echocardiogram on 11/22/2018 showed an EF of 60 to 65%, during that time she was also in acute congestive heart failure.  She was diuresed and discharged home on Bumex, placed on Eliquis for 3 days and underwent a successful DCCV on 12/20/2018.  Her PMH also includes essential hypertension, chronic diastolic heart failure, GERD, bilateral sensorineural hearing loss CKD stage III, hyperlipidemia, vitamin D deficiency, obesity, and lumbar stenosis.  She presents to the clinic today and states she has begun walking again on her local track.  She  states she is starting to have more energy and is less short of breath.  However, she dropped a glass on her left foot last evening and has a hematoma on the dorsal side of her foot.  She states that she has a tremor that has returned due to the discontinuation of her atenolol.  But overall,  she feels she is doing better and is getting back to normal.  Her daughter is with her today and agrees.  She has not felt any palpitations.  She denies chest pain, shortness of breath, lower extremity edema, fatigue, palpitations, melena, hematuria,  hemoptysis, diaphoresis, weakness, presyncope, syncope, orthopnea, and PND.  Home Medications    Prior to Admission medications   Medication Sig Start Date End Date Taking? Authorizing Provider  acetaminophen (TYLENOL) 500 MG tablet Take 500-1,000 mg by mouth every 6 (six) hours as needed for moderate pain.    [provider]  albuterol (PROVENTIL HFA;VENTOLIN HFA) 108 (90 Base) MCG/ACT inhaler Use 2 inhalations 15-20 minutes apart every 4 hours as needed to rescue Asthma Patient taking differently: Inhale 2 puffs into the lungs every 4 (four) hours as needed for wheezing.  12/09/18   Unk Pinto, MD  amiodarone (PACERONE) 200 MG tablet Take 1 tablet (200 mg total) by mouth daily. 01/10/19   Almyra Deforest, PA  apixaban (ELIQUIS) 5 MG TABS tablet Take 1 tablet (5 mg total) by mouth 2 (two) times daily. 12/15/18   Kroeger, Lorelee Cover., PA-C  Ascorbic Acid (VITAMIN C) 1000 MG tablet Take 1,000 mg by mouth daily.     [provider]  aspirin EC 81 MG tablet Take 81 mg by mouth daily.    [provider]  bumetanide (BUMEX) 1 MG tablet Take 1/2 to 1 tablet 2 x /day if needed for ankle swelling Patient taking differently: Take 0.5-1 mg by mouth See admin instructions. Take 1 mg in the morning and 0.5 mg at night 12/16/18   Unk Pinto, MD  cholecalciferol (VITAMIN D3) 25 MCG (1000 UT) tablet Take 1,000 Units by mouth daily.     [provider]  loratadine (CLARITIN) 10 MG tablet Take 10 mg by mouth daily as needed for allergies.    [provider]  Misc Natural Products (OSTEO BI-FLEX JOINT SHIELD PO) Take 1 tablet by mouth 2 (two) times daily.     [provider]  Multiple Vitamins-Minerals (PRESERVISION AREDS 2) CAPS Take 1 capsule by mouth 2 (two) times daily.    [provider]  Naphazoline-Pheniramine (OPCON-A) 0.027-0.315 % SOLN Place 1 drop into both eyes daily as needed (allergies).    [provider]  vitamin C (ASCORBIC  ACID) 250 MG tablet Take 250 mg by mouth daily.    [provider]    Family History    Family History  Problem Relation Age of Onset  . Hypertension Mother   . Hyperlipidemia Mother   . Heart disease Sister   . Cancer Brother        prostate   She indicated that her mother is deceased. She indicated that her father is deceased. She indicated that her sister is deceased. She indicated that her brother is alive.  Social History    Social History   Socioeconomic History  . Marital status: Widowed    Spouse name: Not on file  . Number of children: Not on file  . Years of education: Not on file  . Highest education level: Not on file  Occupational History  .  Not on file  Social Needs  . Financial resource strain: Not on file  . Food insecurity    Worry: Not on file    Inability: Not on file  . Transportation needs    Medical: Not on file    Non-medical: Not on file  Tobacco Use  . Smoking status: Never Smoker  . Smokeless tobacco: Never Used  Substance and Sexual Activity  . Alcohol use: No  . Drug use: No  . Sexual activity: Not on file  Lifestyle  . Physical activity    Days per week: Not on file    Minutes per session: Not on file  . Stress: Not on file  Relationships  . Social Herbalist on phone: Not on file    Gets together: Not on file    Attends religious service: Not on file    Active member of club or organization: Not on file    Attends meetings of clubs or organizations: Not on file    Relationship status: Not on file  . Intimate partner violence    Fear of current or ex partner: Not on file    Emotionally abused: Not on file    Physically abused: Not on file    Forced sexual activity: Not on file  Other Topics Concern  . Not on file  Social History Narrative  . Not on file     Review of Systems    General:  No chills, fever, night sweats or weight changes.  Cardiovascular:  No chest pain, dyspnea on exertion, edema,  orthopnea, palpitations, paroxysmal nocturnal dyspnea. Dermatological: No rash, lesions/masses Respiratory: No cough, dyspnea Urologic: No hematuria, dysuria Abdominal:   No nausea, vomiting, diarrhea, bright red blood per rectum, melena, or hematemesis Neurologic:  No visual changes, wkns, changes in mental status. All other systems reviewed and are otherwise negative except as noted above.  Physical Exam    VS:  BP (!) 168/86 (BP Location: Right Arm, Patient Position: Sitting, Cuff Size: Normal)   Pulse 77   Ht 5\' 2"  (1.575 m)   Wt 160 lb 12.8 oz (72.9 kg)   SpO2 95%   BMI 29.41 kg/m  , BMI Body mass index is 29.41 kg/m. GEN: Well nourished, well developed, in no acute distress. HEENT: normal. Neck: Supple, no JVD, carotid bruits, or masses. Cardiac: RRR, no murmurs, rubs, or gallops. No clubbing, cyanosis, edema.  Radials/DP/PT 2+ and equal bilaterally.  Respiratory:  Respirations regular and unlabored, clear to auscultation bilaterally. GI: Soft, nontender, nondistended, BS + x 4. MS: no deformity or atrophy. Skin: warm and dry, no rash.  Hematoma dorsal side of left foot Neuro:  Strength and sensation are intact. Psych: Normal affect.  Accessory Clinical Findings    ECG personally reviewed by me today-normal sinus rhythm 67 bpm  EKG 06/16/2019 Sinus bradycardia 47 bpm  EKG 06/06/2019 atrial fibrillation 76 bpm  EKG 01/10/2019: Atrial fibrillation 74 bpm  EKG 12/28/2018: Atrial fibrillation 111 bpm  EKG 12/20/2018: Sinus bradycardia 58 bpm  Echocardiogram 11/22/2018: Study Conclusions  - Procedure narrative: Transthoracic echocardiography. Image quality was adequate. The study was technically difficult. - Left ventricle: The cavity size was normal. Wall thickness was increased in a pattern of mild LVH. Systolic function was normal. The estimated ejection fraction was in the range of 60% to 65%. Wall motion was normal; there were no regional wall motion  abnormalities. The study was not technically sufficient to allow evaluation of LV diastolic dysfunction due  to atrial fibrillation. - Aortic valve: There was trivial regurgitation directed eccentrically in the LVOT and towards the mitral anterior leaflet. - Left atrium: The atrium was mildly dilated. - Pulmonic valve: There was trivial regurgitation. - Pericardium, extracardiac: A trivial, free-flowing pericardial effusion was identified circumferential to the heart. Assessment & Plan   1.  Persistent atrial fibrillation- Underwent successful DCCV on 06/16/2019 with 1 shock 200 J.  EKG today normal sinus rhythm 67 bpm Continue amiodarone 200 mg tablet daily Continue apixaban 5 mg tablet twice daily Restart atenolol 25 mg tablet daily Continue Bumex half to 1 tablet twice daily as needed Maintain physical activity as tolerated Low-sodium heart healthy diet- educated about low-sodium food choices CHA2DS2/VAS Stroke Risk Points 6 high risk (chf,htn,age, diabetes, female)    2.  Hyperlipidemia-04/05/2019: Cholesterol 210; HDL 62; LDL Cholesterol (Calc) 128; Triglycerides 96 Not currently on statin medication Continue heart healthy low-sodium diet Maintain physical activity Monitored by PCP  3.  Essential hypertension-  elevated today 177/91 Restart atenolol 25 mg tablet daily Heart healthy low-sodium diet Maintain physical activity  4.  Chronic diastolic congestive heart failure- echocardiogram 01/21/9023 diastolic dysfunction due to atrial fibrillation.  Euvolemic today Continue Bumex half tablet to 1 tablet twice daily as needed for ankle swelling Heart healthy low-sodium diet Maintain physical activity as needed  5.  Hematoma left foot-patient dropped drinking glass onto left foot. DP and PT pulses +2, and no tingling or numbness Recommend RICE- rest, ice, light compression, and elevation. Patient educated about importance of preventing falls, accidents and need to  report injuries due to her high bleeding risk on Eliquis.  Patient and daughter agree, no further questions at this time.  Disposition: Follow-up with Dr. Claiborne Billings or APP in 2 to 3 weeks.  Deberah Pelton, NP-C  06/30/2019, 4:12 PM

## 2019-07-13 ENCOUNTER — Ambulatory Visit: Payer: Self-pay | Admitting: Internal Medicine

## 2019-07-20 ENCOUNTER — Other Ambulatory Visit: Payer: Self-pay

## 2019-07-20 ENCOUNTER — Encounter: Payer: Self-pay | Admitting: Internal Medicine

## 2019-07-20 ENCOUNTER — Ambulatory Visit (INDEPENDENT_AMBULATORY_CARE_PROVIDER_SITE_OTHER): Payer: Medicare Other | Admitting: Internal Medicine

## 2019-07-20 VITALS — BP 138/90 | HR 56 | Temp 97.6°F | Resp 16 | Ht 61.5 in | Wt 162.4 lb

## 2019-07-20 DIAGNOSIS — Z79899 Other long term (current) drug therapy: Secondary | ICD-10-CM | POA: Diagnosis not present

## 2019-07-20 DIAGNOSIS — E782 Mixed hyperlipidemia: Secondary | ICD-10-CM | POA: Diagnosis not present

## 2019-07-20 DIAGNOSIS — E1122 Type 2 diabetes mellitus with diabetic chronic kidney disease: Secondary | ICD-10-CM

## 2019-07-20 DIAGNOSIS — L570 Actinic keratosis: Secondary | ICD-10-CM | POA: Diagnosis not present

## 2019-07-20 DIAGNOSIS — I10 Essential (primary) hypertension: Secondary | ICD-10-CM

## 2019-07-20 DIAGNOSIS — E559 Vitamin D deficiency, unspecified: Secondary | ICD-10-CM | POA: Diagnosis not present

## 2019-07-20 DIAGNOSIS — L820 Inflamed seborrheic keratosis: Secondary | ICD-10-CM | POA: Diagnosis not present

## 2019-07-20 DIAGNOSIS — N183 Chronic kidney disease, stage 3 (moderate): Secondary | ICD-10-CM

## 2019-07-20 DIAGNOSIS — L918 Other hypertrophic disorders of the skin: Secondary | ICD-10-CM | POA: Diagnosis not present

## 2019-07-20 DIAGNOSIS — C44311 Basal cell carcinoma of skin of nose: Secondary | ICD-10-CM | POA: Diagnosis not present

## 2019-07-20 MED ORDER — TERBINAFINE HCL 250 MG PO TABS: ORAL_TABLET | ORAL | 1 refills | Status: DC

## 2019-07-20 MED ORDER — TRAZODONE HCL 150 MG PO TABS: ORAL_TABLET | ORAL | 1 refills | Status: DC

## 2019-07-20 NOTE — Progress Notes (Signed)
History of Present Illness:      This very nice 85 y.o. WWF presents for 6 month follow up with HTN, HLD, T2_DM and Vitamin D Deficiency.       Patient is treated for HTN (1996) & BP has been controlled at home. Today's BP is borderline today at 138/90. Myoview in 2018 showed Nl EF 70%.   Patient has dx/o chronic Diastolic HF since Jan 4128 when she was dx'd with Afib (and started on Eliquis)  & had 1st CV by Dr Buford Dresser on Feb 4th and more recently had a 2sd CV for persistent Afib on 06/16/2019 by Dr Dorris Carnes. Patient has had no complaints of any cardiac type chest pain, palpitations, dyspnea / orthopnea / PND, dizziness, claudication, or dependent edema.      Hyperlipidemia is not controlled with diet & meds. Patient denies myalgias or other med SE's. Last Lipids were not at goal & patient was strongly encouraged dietary measure in avoidance of meds at her age of 15.  Lab Results  Component Value Date   CHOL 210 (H) 04/05/2019   HDL 62 04/05/2019   LDLCALC 128 (H) 04/05/2019   TRIG 96 04/05/2019   CHOLHDL 3.4 04/05/2019       Also, the patient has history of T2_NIDDM (A1c 6.6% / 2011)  attempting control w/diet and has had no symptoms of reactive hypoglycemia, diabetic polys, paresthesias or visual blurring.  Last A1c was not at goal: Lab Results  Component Value Date   HGBA1C 6.6 (H) 04/05/2019       Further, the patient also has history of Vitamin D Deficiency (23" / 2008) and supplements vitamin D without any suspected side-effects. Last vitamin D was at goal: Lab Results  Component Value Date   VD25OH 65 12/29/2018   Current Outpatient Medications on File Prior to Visit  Medication Sig  . acetaminophen  500 MG t Take 500-1,000 mg  every 6  hours as needed  . albuterol HFA inhaler Use 2 inhalations 15-20 minutes apart every 4 hours to rescue Asthma  . amiodarone  200 MG  Take 1 tablet  daily.  Marland Kitchen ELIQUIS 5 MG  Take 1 tablet  2 (two) times daily.  Marland Kitchen VITAMIN C  1000 MG Take  daily.   Marland Kitchen aspirin EC 81 MG Take  daily.  Marland Kitchen atenolol  25 MG Take 1 tablet daily.  . bumetanide  1 MG  Take 1/2 to 1 tablet 2 x /day if needed for ankle swelling   . VITAMIN D 25 MCG (1000 UT)  Take 1,000 Units by mouth daily.   Marland Kitchen loratadine 10 MG  Take 10 mg by mouth daily as needed for allergies.  Jefm Miles BI-FLEX JOINT SHIELD  Take 1 tablet  2  times daily.   Marland Kitchen PRESERVISION AREDS 2 Take 1 capsule  2  times daily.  Nicole Kindred  SOLN Place 1 drop into both eyes daily as needed   . vitamin C 250 MG tablet Take  daily.    Allergies  Allergen Reactions  . Accupril [Quinapril Hcl] Cough  . Neosporin [Neomycin-Bacitracin Zn-Polymyx]     unknown  . Prednisone     Agitated and shaky  . Reglan [Metoclopramide] Other (See Comments)    tremors  . Tramadol Nausea And Vomiting  . Adhesive [Tape] Rash   PMHx:   Past Medical History:  Diagnosis Date  . Acute on chronic diastolic (congestive) heart failure ([place]) 11/21/2018  .  Cancer ([place])    skin cancer  . DDD (degenerative disc disease)   . Diverticula of colon   . Diverticulitis   . DJD (degenerative joint disease)   . GERD (gastroesophageal reflux disease)    takes ranitidine  . Heart murmur   . History of hiatal hernia   . History of pneumonia   . Hx of irritable bowel syndrome   . Hyperlipidemia   . Occasional tremors   . Osteoporosis   . Pre-diabetes   . Varicose veins   . Vitamin D deficiency    Immunization History  Administered Date(s) Administered  . DT 09/05/2015  . DTaP 11/16/2004  . Influenza Whole 08/30/2013  . Influenza, High Dose Seasonal PF 08/28/2015, 08/10/2016  . Pneumococcal Conjugate-13 10/19/2016  . Pneumococcal Polysaccharide-23 11/16/2001  . Zoster 06/01/2013   Past Surgical History:  Procedure Laterality Date  . APPENDECTOMY    . BREAST SURGERY Right    fluid drained from breast  . CARDIOVERSION N/A 12/20/2018   Procedure: CARDIOVERSION;  Surgeon: Buford Dresser, MD;  Location:  [facility] ENDOSCOPY;  Service: Cardiovascular;  Laterality: N/A;  . CARDIOVERSION N/A 06/16/2019   Procedure: CARDIOVERSION;  Surgeon: Fay Records, MD;  Location: [place];  Service: Cardiovascular;  Laterality: N/A;  . COLONOSCOPY    . ESOPHAGOGASTRODUODENOSCOPY    . JOINT REPLACEMENT Left    left hip  . LUMBAR LAMINECTOMY/DECOMPRESSION MICRODISCECTOMY N/A 06/05/2015   Procedure: L3-L5 DECOMPRESSION ;  Surgeon: Melina Schools, MD;  Location: [place];  Service: Orthopedics;  Laterality: N/A;  . OPEN REDUCTION INTERNAL FIXATION (ORIF) DISTAL RADIAL FRACTURE Right 01/10/2014   Procedure: OPEN REDUCTION INTERNAL FIXATION (ORIF) DISTAL RADIAL FRACTURE;  Surgeon: Linna Hoff, MD;  Location: [place];  Service: Orthopedics;  Laterality: Right;  . SPINAL CORD DECOMPRESSION  06/05/2015   L3 L 5  . THYROID SURGERY    . TONSILLECTOMY    . TUBAL LIGATION    . varicose veins stripped     FHx:    Reviewed / unchanged  SHx:    Reviewed / unchanged   Systems Review:  Constitutional: Denies fever, chills, wt changes, headaches, insomnia, fatigue, night sweats, change in appetite. Eyes: Denies redness, blurred vision, diplopia, discharge, itchy, watery eyes.  ENT: Denies discharge, congestion, post nasal drip, epistaxis, sore throat, earache, hearing loss, dental pain, tinnitus, vertigo, sinus pain, snoring.  CV: Denies chest pain, palpitations, irregular heartbeat, syncope, dyspnea, diaphoresis, orthopnea, PND, claudication or edema. Respiratory: denies cough, dyspnea, DOE, pleurisy, hoarseness, laryngitis, wheezing.  Gastrointestinal: Denies dysphagia, odynophagia, heartburn, reflux, water brash, abdominal pain or cramps, nausea, vomiting, bloating, diarrhea, constipation, hematemesis, melena, hematochezia  or hemorrhoids. Genitourinary: Denies dysuria, frequency, urgency, nocturia, hesitancy, discharge, hematuria or flank pain. Musculoskeletal: Denies arthralgias, myalgias, stiffness, jt. swelling, pain,  limping or strain/sprain.  Skin: Denies pruritus, rash, hives, warts, acne, eczema or change in skin lesion(s). Neuro: No weakness, tremor, incoordination, spasms, paresthesia or pain. Psychiatric: Denies confusion, memory loss or sensory loss. Endo: Denies change in weight, skin or hair change.  Heme/Lymph: No excessive bleeding, bruising or enlarged lymph nodes.  Physical Exam  BP 138/90   Pulse (!) 56   Temp 97.6 F (36.4 C)   Resp 16   Ht 5' 1.5" (1.562 m)   Wt 162 lb 6.4 oz (73.7 kg)   BMI 30.19 kg/m   Appears  well nourished, well groomed  and in no distress.  Eyes: PERRLA, EOMs, conjunctiva no swelling or erythema. Sinuses: No frontal/maxillary tenderness ENT/Mouth: EAC's  clear, TM's nl w/o erythema, bulging. Nares clear w/o erythema, swelling, exudates. Oropharynx clear without erythema or exudates. Oral hygiene is good. Tongue normal, non obstructing. Hearing intact.  Neck: Supple. Thyroid not palpable. Car 2+/2+ without bruits, nodes or JVD. Chest: Respirations nl with BS clear & equal w/o rales, rhonchi, wheezing or stridor.  Cor: Heart sounds normal w/ regular rate and rhythm without sig. murmurs, gallops, clicks or rubs. Peripheral pulses normal and equal  without edema.  Abdomen: Soft & bowel sounds normal. Non-tender w/o guarding, rebound, hernias, masses or organomegaly.  Lymphatics: Unremarkable.  Musculoskeletal: Full ROM all peripheral extremities, joint stability, 5/5 strength and normal gait.  Skin: Warm, dry without exposed rashes, lesions or ecchymosis apparent. Chalky thickened dystrophic toe nails. Neuro: Cranial nerves intact, reflexes equal bilaterally. Sensory-motor testing grossly intact. Tendon reflexes grossly intact.  Pysch: Alert & oriented x 3.  Insight and judgement nl & appropriate. No ideations.  Assessment and Plan:  1. Essential hypertension  - Continue medication, monitor blood pressure at home.  - Continue DASH diet.  Reminder to go to  the ER if any CP,  SOB, nausea, dizziness, severe HA, changes vision/speech.  - CBC with Differential/Platelet - COMPLETE METABOLIC PANEL WITH GFR - Magnesium - TSH  2. Hyperlipidemia, mixed  - Continue diet/meds, exercise,& lifestyle modifications.  - Continue monitor periodic cholesterol/liver & renal functions   - Lipid panel - TSH  3. Type 2 diabetes mellitus with stage 3 chronic kidney disease, without long-term current use of insulin (HCC)  - Continue diet, exercise  - Lifestyle modifications.  - Monitor appropriate labs.  - Hemoglobin A1c - Insulin, random  4. Vitamin D deficiency  - Continue supplementation.  - VITAMIN D 25 Hydroxyl  5. Medication management  - CBC with Differential/Platelet - COMPLETE METABOLIC PANEL WITH GFR - Magnesium - Lipid panel - TSH - Hemoglobin A1c - Insulin, random - VITAMIN D 25 Hydroxyl         Discussed  regular exercise, BP monitoring, weight control to achieve/maintain BMI less than 25 and discussed med and SE's. Recommended labs to assess and monitor clinical status with further disposition pending results of labs. Given Rx Trazodone to try for poor sleep hygiene and Lamisil  250 mg  X 1 Rf for fungal toenails.  I discussed the assessment and treatment plan with the patient. The patient was provided an opportunity to ask questions and all were answered. The patient agreed with the plan and demonstrated an understanding of the instructions.  I provided over 30 minutes of exam, counseling, chart review and  complex critical decision making.  Kirtland Bouchard, MD

## 2019-07-20 NOTE — Patient Instructions (Signed)

## 2019-07-21 ENCOUNTER — Other Ambulatory Visit: Payer: Self-pay | Admitting: General Practice

## 2019-07-21 ENCOUNTER — Encounter: Payer: Self-pay | Admitting: Physician Assistant

## 2019-07-21 ENCOUNTER — Ambulatory Visit (INDEPENDENT_AMBULATORY_CARE_PROVIDER_SITE_OTHER): Payer: Medicare Other | Admitting: General Practice

## 2019-07-21 ENCOUNTER — Other Ambulatory Visit: Payer: Self-pay | Admitting: Internal Medicine

## 2019-07-21 VITALS — BP 161/86 | HR 55 | Ht 61.5 in | Wt 161.6 lb

## 2019-07-21 DIAGNOSIS — S9032XD Contusion of left foot, subsequent encounter: Secondary | ICD-10-CM

## 2019-07-21 DIAGNOSIS — I4819 Other persistent atrial fibrillation: Secondary | ICD-10-CM | POA: Diagnosis not present

## 2019-07-21 DIAGNOSIS — I10 Essential (primary) hypertension: Secondary | ICD-10-CM

## 2019-07-21 DIAGNOSIS — I5032 Chronic diastolic (congestive) heart failure: Secondary | ICD-10-CM | POA: Diagnosis not present

## 2019-07-21 LAB — CBC WITH DIFFERENTIAL/PLATELET
Absolute Monocytes: 907 cells/uL (ref 200–950)
Basophils Absolute: 38 cells/uL (ref 0–200)
Basophils Relative: 0.6 %
Eosinophils Absolute: 151 cells/uL (ref 15–500)
Eosinophils Relative: 2.4 %
HCT: 39.7 % (ref 35.0–45.0)
Hemoglobin: 13 g/dL (ref 11.7–15.5)
Lymphs Abs: 1172 cells/uL (ref 850–3900)
MCH: 29.8 pg (ref 27.0–33.0)
MCHC: 32.7 g/dL (ref 32.0–36.0)
MCV: 91.1 fL (ref 80.0–100.0)
MPV: 11.7 fL (ref 7.5–12.5)
Monocytes Relative: 14.4 %
Neutro Abs: 4032 cells/uL (ref 1500–7800)
Neutrophils Relative %: 64 %
Platelets: 186 10*3/uL (ref 140–400)
RBC: 4.36 10*6/uL (ref 3.80–5.10)
RDW: 13.5 % (ref 11.0–15.0)
Total Lymphocyte: 18.6 %
WBC: 6.3 10*3/uL (ref 3.8–10.8)

## 2019-07-21 LAB — HEMOGLOBIN A1C
Hgb A1c MFr Bld: 6.5 % of total Hgb — ABNORMAL HIGH (ref <5.7)
Mean Plasma Glucose: 140 (calc)
eAG (mmol/L): 7.7 (calc)

## 2019-07-21 LAB — LIPID PANEL
Cholesterol: 215 mg/dL — ABNORMAL HIGH (ref <200)
HDL: 64 mg/dL (ref >=50)
LDL Cholesterol (Calc): 129 mg/dL (calc) — ABNORMAL HIGH
Non-HDL Cholesterol (Calc): 151 mg/dL (calc) — ABNORMAL HIGH (ref <130)
Total CHOL/HDL Ratio: 3.4 (calc) (ref <5.0)
Triglycerides: 108 mg/dL (ref <150)

## 2019-07-21 LAB — COMPLETE METABOLIC PANEL WITH GFR
AG Ratio: 2.1 (calc) (ref 1.0–2.5)
ALT: 16 U/L (ref 6–29)
AST: 21 U/L (ref 10–35)
Albumin: 4.2 g/dL (ref 3.6–5.1)
Alkaline phosphatase (APISO): 93 U/L (ref 37–153)
BUN/Creatinine Ratio: 20 (calc) (ref 6–22)
BUN: 26 mg/dL — ABNORMAL HIGH (ref 7–25)
CO2: 29 mmol/L (ref 20–32)
Calcium: 9.6 mg/dL (ref 8.6–10.4)
Chloride: 104 mmol/L (ref 98–110)
Creat: 1.31 mg/dL — ABNORMAL HIGH (ref 0.60–0.88)
GFR, Est African American: 44 mL/min/{1.73_m2} — ABNORMAL LOW (ref >=60)
GFR, Est Non African American: 38 mL/min/{1.73_m2} — ABNORMAL LOW (ref >=60)
Globulin: 2 g/dL (calc) (ref 1.9–3.7)
Glucose, Bld: 142 mg/dL — ABNORMAL HIGH (ref 65–99)
Potassium: 4.2 mmol/L (ref 3.5–5.3)
Sodium: 142 mmol/L (ref 135–146)
Total Bilirubin: 0.6 mg/dL (ref 0.2–1.2)
Total Protein: 6.2 g/dL (ref 6.1–8.1)

## 2019-07-21 LAB — INSULIN, RANDOM: Insulin: 7.1 u[IU]/mL

## 2019-07-21 LAB — TSH: TSH: 1.34 mIU/L (ref 0.40–4.50)

## 2019-07-21 LAB — MAGNESIUM: Magnesium: 2.3 mg/dL (ref 1.5–2.5)

## 2019-07-21 LAB — VITAMIN D 25 HYDROXY (VIT D DEFICIENCY, FRACTURES): Vit D, 25-Hydroxy: 37 ng/mL (ref 30–100)

## 2019-07-21 NOTE — Progress Notes (Signed)
Cardiology Clinic Note   Patient Name: Briana [name] Date of Encounter: 07/21/2019  Primary Care Provider:  Unk Pinto, MD Primary Cardiologist:  Shelva Majestic, MD  Patient Profile  Briana [name] 85 year old female presents today status post DCCV for hypertension follow-up.  Past Medical History    Past Medical History:  Diagnosis Date  . Acute on chronic diastolic (congestive) heart failure ([place]) 11/21/2018  . Cancer ([place])    skin cancer  . DDD (degenerative disc disease)   . Diverticula of colon   . Diverticulitis   . DJD (degenerative joint disease)   . GERD (gastroesophageal reflux disease)    takes ranitidine  . Heart murmur   . History of hiatal hernia   . History of pneumonia   . Hx of irritable bowel syndrome   . Hyperlipidemia   . Occasional tremors   . Osteoporosis   . Pre-diabetes   . Varicose veins   . Vitamin D deficiency    Past Surgical History:  Procedure Laterality Date  . APPENDECTOMY    . BREAST SURGERY Right    fluid drained from breast  . CARDIOVERSION N/A 12/20/2018   Procedure: CARDIOVERSION;  Surgeon: Buford Dresser, MD;  Location: [facility] ENDOSCOPY;  Service: Cardiovascular;  Laterality: N/A;  . CARDIOVERSION N/A 06/16/2019   Procedure: CARDIOVERSION;  Surgeon: Fay Records, MD;  Location: [place];  Service: Cardiovascular;  Laterality: N/A;  . COLONOSCOPY    . ESOPHAGOGASTRODUODENOSCOPY    . JOINT REPLACEMENT Left    left hip  . LUMBAR LAMINECTOMY/DECOMPRESSION MICRODISCECTOMY N/A 06/05/2015   Procedure: L3-L5 DECOMPRESSION ;  Surgeon: Melina Schools, MD;  Location: [place];  Service: Orthopedics;  Laterality: N/A;  . OPEN REDUCTION INTERNAL FIXATION (ORIF) DISTAL RADIAL FRACTURE Right 01/10/2014   Procedure: OPEN REDUCTION INTERNAL FIXATION (ORIF) DISTAL RADIAL FRACTURE;  Surgeon: Linna Hoff, MD;  Location: [place];  Service: Orthopedics;  Laterality: Right;  . SPINAL CORD DECOMPRESSION  06/05/2015   L3 L 5  . THYROID SURGERY     . TONSILLECTOMY    . TUBAL LIGATION    . varicose veins stripped      Allergies  Allergies  Allergen Reactions  . Accupril [Quinapril Hcl] Cough  . Neosporin [Neomycin-Bacitracin Zn-Polymyx]     unknown  . Prednisone     Agitated and shaky  . Reglan [Metoclopramide] Other (See Comments)    tremors  . Tramadol Nausea And Vomiting  . Adhesive [Tape] Rash    History of Present Illness  Ms. Blecha was last seen by me on 06/30/2019.  During that time she was doing well and maintaining sinus rhythm after DCCV on 06/16/2019.  She received 1 shock with 200 J.  However immediately after DCCV she became bradycardic and her atenolol and diltiazem were stopped.  During her 06/30/2019 visit she was found to be hypertensive and her atenolol was restarted.  She also had a large hematoma on her left foot at that time which she explained as an accident after dropping a drinking glass.  She presents today for follow-up.  She was diagnosed with new onset atrial fibrillation in January 2020.  Her echocardiogram on 11/22/2018 showed an EF of 60 to 65%, during that time she was also in acute congestive heart failure.  She was diuresed and discharged home on Bumex, placed on Eliquis for 3 days and underwent a successful DCCV on 12/20/2018.  Her PMH also includes essential hypertension, chronic diastolic heart failure, GERD, bilateral sensorineural hearing loss  CKD stage III, hyperlipidemia, vitamin D deficiency, obesity, and lumbar stenosis.  She presents to the clinic today and states she is feeling well.  She states she has been walking several days per week and has increased her walking up to 1 hour some days per week.  She has been keeping her blood pressure log and has somewhat labile blood pressures since her last visit she has had blood pressures in the 150s over 80s and as low as 100s over 60s.  Her heart rates have maintained in the mid 50s to high 40s.  She denies chest pain, shortness of breath, lower  extremity edema, fatigue, palpitations, melena, hematuria, hemoptysis, diaphoresis, weakness, presyncope, syncope, orthopnea, and PND.   Home Medications    Prior to Admission medications   Medication Sig Start Date End Date Taking? Authorizing Provider  acetaminophen (TYLENOL) 500 MG tablet Take 500-1,000 mg by mouth every 6 (six) hours as needed for moderate pain.    [provider]  albuterol (PROVENTIL HFA;VENTOLIN HFA) 108 (90 Base) MCG/ACT inhaler Use 2 inhalations 15-20 minutes apart every 4 hours as needed to rescue Asthma Patient taking differently: Inhale 2 puffs into the lungs every 4 (four) hours as needed for wheezing.  12/09/18   Unk Pinto, MD  amiodarone (PACERONE) 200 MG tablet Take 1 tablet (200 mg total) by mouth daily. 01/10/19   Almyra Deforest, PA  apixaban (ELIQUIS) 5 MG TABS tablet Take 1 tablet (5 mg total) by mouth 2 (two) times daily. 12/15/18   Kroeger, Lorelee Cover., PA-C  Ascorbic Acid (VITAMIN C) 1000 MG tablet Take 1,000 mg by mouth daily.     [provider]  aspirin EC 81 MG tablet Take 81 mg by mouth daily.    [provider]  atenolol (TENORMIN) 25 MG tablet Take 1 tablet (25 mg total) by mouth daily. 06/30/19   Deberah Pelton, NP  bumetanide (BUMEX) 1 MG tablet Take 1/2 to 1 tablet 2 x /day if needed for ankle swelling Patient taking differently: Take 0.5-1 mg by mouth See admin instructions. Take 1 mg in the morning and 0.5 mg at night 12/16/18   Unk Pinto, MD  cholecalciferol (VITAMIN D3) 25 MCG (1000 UT) tablet Take 1,000 Units by mouth daily.     [provider]  loratadine (CLARITIN) 10 MG tablet Take 10 mg by mouth daily as needed for allergies.    [provider]  Misc Natural Products (OSTEO BI-FLEX JOINT SHIELD PO) Take 1 tablet by mouth 2 (two) times daily.     [provider]  Multiple Vitamins-Minerals (PRESERVISION AREDS 2) CAPS Take 1 capsule by mouth 2 (two) times daily.    [provider]  Naphazoline-Pheniramine (OPCON-A) 0.027-0.315 % SOLN Place 1 drop into both eyes daily as needed (allergies).    [provider]  terbinafine (LAMISIL) 250 MG tablet Take 1 tablet Daily for Toenail Fungus 07/20/19   Unk Pinto, MD  traZODone (DESYREL) 150 MG tablet Take 1/2 to 1 tablet     1 to 2 hours before Bedtime as needed for Sleep 07/20/19   Unk Pinto, MD  vitamin C (ASCORBIC ACID) 250 MG tablet Take 250 mg by mouth daily.    [provider]    Family History    Family History  Problem Relation Age of Onset  . Hypertension Mother   . Hyperlipidemia Mother   . Heart disease Sister   . Cancer Brother        prostate  She indicated that her mother is deceased. She indicated that her father is deceased. She indicated that her sister is deceased. She indicated that her brother is alive.  Social History    Social History   Socioeconomic History  . Marital status: Widowed    Spouse name: Not on file  . Number of children: Not on file  . Years of education: Not on file  . Highest education level: Not on file  Occupational History  . Not on file  Social Needs  . Financial resource strain: Not on file  . Food insecurity    Worry: Not on file    Inability: Not on file  . Transportation needs    Medical: Not on file    Non-medical: Not on file  Tobacco Use  . Smoking status: Never Smoker  . Smokeless tobacco: Never Used  Substance and Sexual Activity  . Alcohol use: No  . Drug use: No  . Sexual activity: Not on file  Lifestyle  . Physical activity    Days per week: Not on file    Minutes per session: Not on file  . Stress: Not on file  Relationships  . Social Herbalist on phone: Not on file    Gets together: Not on file    Attends religious service: Not on file    Active member of club or organization: Not on file    Attends meetings of clubs or organizations: Not on file    Relationship status: Not on file  .  Intimate partner violence    Fear of current or ex partner: Not on file    Emotionally abused: Not on file    Physically abused: Not on file    Forced sexual activity: Not on file  Other Topics Concern  . Not on file  Social History Narrative  . Not on file     Review of Systems    General:  No chills, fever, night sweats or weight changes.  Cardiovascular:  No chest pain, dyspnea on exertion, edema, orthopnea, palpitations, paroxysmal nocturnal dyspnea. Dermatological: No rash, lesions/masses Respiratory: No cough, dyspnea Urologic: No hematuria, dysuria Abdominal:   No nausea, vomiting, diarrhea, bright red blood per rectum, melena, or hematemesis Neurologic:  No visual changes, wkns, changes in mental status. All other systems reviewed and are otherwise negative except as noted above.  Physical Exam    VS:  BP (!) 161/86   Pulse (!) 55   Ht 5' 1.5" (1.562 m)   Wt 161 lb 9.6 oz (73.3 kg)   BMI 30.04 kg/m  , BMI Body mass index is 30.04 kg/m. GEN: Well nourished, well developed, in no acute distress. HEENT: normal. Neck: Supple, no JVD, carotid bruits, or masses. Cardiac: RRR, no murmurs, rubs, or gallops. No clubbing, cyanosis, edema.  Radials/DP/PT 2+ and equal bilaterally.  Respiratory:  Respirations regular and unlabored, clear to auscultation bilaterally. GI: Soft, nontender, nondistended, BS + x 4. MS: no deformity or atrophy. Skin: warm and dry, no rash. Neuro:  Strength and sensation are intact. Psych: Normal affect.  Accessory Clinical Findings    ECG personally reviewed by me today-none today- No acute changes  EKG 06/30/2019 Normal sinus rhythm 67 bpm  EKG 06/16/2019 Sinus bradycardia 47 bpm  EKG 06/06/2019 atrial fibrillation 76 bpm  EKG 01/10/2019: Atrial fibrillation 74 bpm  EKG 12/28/2018: Atrial fibrillation 111 bpm  EKG 12/20/2018: Sinus bradycardia 58 bpm  Echocardiogram 11/22/2018: Study Conclusions  - Procedure narrative:  Transthoracic echocardiography. Image quality was adequate. The study was technically difficult. - Left ventricle: The cavity size was normal. Wall thickness was increased in a pattern of mild LVH. Systolic function was normal. The estimated ejection fraction was in the range of 60% to 65%. Wall motion was normal; there were no regional wall motion abnormalities. The study was not technically sufficient to allow evaluation of LV diastolic dysfunction due to atrial fibrillation. - Aortic valve: There was trivial regurgitation directed eccentrically in the LVOT and towards the mitral anterior leaflet. - Left atrium: The atrium was mildly dilated. - Pulmonic valve: There was trivial regurgitation. - Pericardium, extracardiac: A trivial, free-flowing pericardial effusion was identified circumferential to the heart.  Assessment & Plan   1.  Essential hypertension-blood pressure today 161/86.  Labile at home blood pressures as high as 150s over 80s and as low as 100s over 60s. Continue atenolol 25 mg daily Heart healthy low-sodium diet Maintain physical activity  2.  Persistent atrial fibrillation- successful DCCV on 06/16/2019 with 1 shock at 200 J.  Heart rate 55 bpm today Continue amiodarone 200 mg tablet daily Continue apixaban 5 mg tablet twice daily Continue atenolol 25 mg tablet daily Continue Bumex half to 1 tablet twice daily as needed Maintain physical activity as tolerated Low-sodium heart healthy diet CHA2DS2/VAS Stroke Risk Points 6 high risk (chf,htn,age, diabetes, female) Instructed to avoid caffeinated beverages and chocolate  3.  Chronic diastolic congestive heart failure- echocardiogram 02/21/1858 diastolic dysfunction due to atrial fibrillation.  No peripheral edema today. Continue Bumex half tablet to 1 tablet twice daily as needed for ankle swelling Heart healthy low-sodium diet Maintain physical activity  4.  Hematoma left foot- patient dropped  drinking glass onto left foot.  Today her left foot looks much better and is healing nicely.  However, she has a contusion on her right fifth toe from walking into a chair in her kitchen. Where house shoes to protect feet.   Disposition follow-up with Dr. Claiborne Billings in 2-3 months  Deberah Pelton, NP-C 07/21/2019, 1:48 PM

## 2019-07-21 NOTE — Patient Instructions (Signed)
Medication Instructions:  Your physician recommends that you continue on your current medications as directed. Please refer to the Current Medication list given to you today.  If you need a refill on your cardiac medications before your next appointment, please call your pharmacy.    Follow-Up: At [facility], you and your health needs are our priority.  As part of our continuing mission to provide you with exceptional heart care, we have created designated Provider Care Teams.  These Care Teams include your primary Cardiologist (physician) and Advanced Practice Providers (APPs -  Physician Assistants and Nurse Practitioners) who all work together to provide you with the care you need, when you need it. You will need a follow up appointment in 2 months.  Please call our office 2 months in advance to schedule this appointment.  You may see Shelva Majestic, MD or one of the following Advanced Practice Providers on your designated Care Team: [place], [place] . Fabian Sharp, PA-C  Any Other Special Instructions Will Be Listed Below (If Applicable). No caffeinated beverages and no chocolate.  Please continue your blood pressure log to bring with you to your follow-up appointment.

## 2019-07-23 ENCOUNTER — Encounter: Payer: Self-pay | Admitting: Internal Medicine

## 2019-07-27 ENCOUNTER — Other Ambulatory Visit: Payer: Self-pay | Admitting: Internal Medicine

## 2019-07-27 ENCOUNTER — Telehealth: Payer: Self-pay | Admitting: *Deleted

## 2019-07-27 MED ORDER — DEXAMETHASONE 0.5 MG PO TABS: ORAL_TABLET | ORAL | 0 refills | Status: DC

## 2019-07-27 MED ORDER — MECLIZINE HCL 25 MG PO TABS: ORAL_TABLET | ORAL | 11 refills | Status: DC

## 2019-07-27 NOTE — Telephone Encounter (Signed)
Patient called and complained of dizziness, with difficulty walking.  An RX for Meclizine was sent to to the patient pharmacy for Meclizine by Dr Melford Aase.Patient is aware.

## 2019-07-27 NOTE — Telephone Encounter (Signed)
error 

## 2019-08-01 ENCOUNTER — Telehealth: Payer: Self-pay | Admitting: *Deleted

## 2019-08-01 NOTE — Telephone Encounter (Signed)
Patient called and asked if she needs to schedule a lab to check her liver enzymes, since she is taking Terbinafine. Per Dr Melford Aase, since her liver enzymes were normal on 07/20/2019, she can wait until her 10/2019 visit to recheck.  Patient is aware.

## 2019-08-11 ENCOUNTER — Telehealth: Payer: Self-pay | Admitting: Cardiovascular Disease

## 2019-08-11 NOTE — Telephone Encounter (Signed)
Per daughter, patient has high BP readings. Patient is doing OK and has no complaints. Per Denyse Amass, NP note on 9/4 HTN was discussed - has labile HTN. Advised that patient check BP before medications and then 1-2 hours after taking atenolol. Advised they call on-call provider if she has headaches, blurry vision, etc. Advised will forward the message to [facility] NP to review and advise if med changes need to be made.   9/25 - 182/80, 167/80 Previous readings: 160/69, 161/85, 181/75, 168/79, 173/81, 159/77, 140/71, 166/74, 180/82, 141/80

## 2019-08-11 NOTE — Telephone Encounter (Signed)
  Pt c/o BP issue: STAT if pt c/o blurred vision, one-sided weakness or slurred speech  1. What are your last 5 BP readings?   173/81 168/79 181/75 161/85 160/69 182/80 167/80  2. Are you having any other symptoms (ex. Dizziness, headache, blurred vision, passed out)? dizziness  3. What is your BP issue? Daughter is concerned about her bp readings being too high

## 2019-08-14 MED ORDER — LOSARTAN POTASSIUM 25 MG PO TABS: 25.0000 mg | ORAL_TABLET | Freq: Every day | ORAL | 3 refills | Status: DC

## 2019-08-14 NOTE — Telephone Encounter (Signed)
Pt c/o BP issue: STAT if pt c/o blurred vision, one-sided weakness or slurred speech  1. What are your last 5 BP readings?   09/26: 190/80 HR 50  09/27: 188/84 HR 51 09/28: 175/84 HR 45  2. Are you having any other symptoms (ex. Dizziness, headache, blurred vision, passed out)?  Slight headache Saturday. Daughters get different answers  3. What is your BP issue? BP remains high, but pulse seems low  Briana [name] (sister of original caller) called to report that the patient's BP is still running a little high. Briana [name] states that when she was visiting last month, she called about the low HR, and some medication was adjusted. She wants to know if that may just need to happen again

## 2019-08-14 NOTE — Telephone Encounter (Signed)
Patient's daughters aware of NP recommendations and agreed w/plan. Scheduled on 10/12 with Angie PA to see Denyse Amass NP. Rx(s) sent to pharmacy electronically.  Suggested that patient record BP/HR and any symptoms she may have and bring log to her 08/28/2019 visit

## 2019-08-14 NOTE — Telephone Encounter (Signed)
Routed to [place] for update from patient

## 2019-08-14 NOTE — Telephone Encounter (Signed)
Please schedule an appointment for me to see Briana [name] in the next week or two. Lets start her on losartan 25 mg daily. Have her continue to monitor her blood pressures and bring the log to her appointment.  Thank you.

## 2019-08-14 NOTE — Telephone Encounter (Signed)
Please call Briana [name] back and see if her b/p continues to be high. She called on 08/11/2019 and was asking for advice.If it continues to be elevated we will add medication. Thanks.

## 2019-08-22 NOTE — Progress Notes (Signed)
Cardiology Office Note:    Date:  08/28/2019   ID:  Briana [name], DOB 03-Feb-1934, MRN [id number]  PCP:  Unk Pinto, MD  Cardiologist:  Shelva Majestic, MD   Referring MD: Unk Pinto, MD   Chief complaint Hypertension  History of Present Illness:    Briana [name] is a 85 y.o. female who presents today for follow-up of her hypertension.  She was last seen by me on 07/21/2019.  During that time she was feeling well has been walking regularly and was keeping a blood pressure log.  Her blood pressure at that time was 161/86 however, her blood pressure is labile and could be as low as 100s over 60s at home.  Her atenolol 25 mg was continued along with her Bumex half to 1 tablet twice daily as needed.  She presents today due to elevated blood pressures in the 250-539 range systolic.  She underwent DCCV on 06/16/2019, where she received 1 shock with 200 J.  She immediately became bradycardic and her atenolol was stopped.  On 06/30/2019 she was found to be hypertensive and her atenolol was restarted.  Her atrial fibrillation was diagnosed/new onset in January 2020.  Her echocardiogram from 11/22/2018 showed an EF of 60 to 65%.  During that time she was also having an acute bout of congestive heart failure, she was diuresed and discharged home on Bumex.  She was placed on Eliquis for 3 days and underwent successful cardioversion on 12/20/2018.  Her PMH also includes essential hypertension, chronic diastolic heart failure, GERD, bilateral sensorineural hearing loss CKD stage III, hyperlipidemia, vitamin D deficiency, obesity, and lumbar stenosis.  She presents to the clinic today and states she feels well.  She continues to walk 3 or more times per week around the track at her local [employer].  She states she has increased her distance walking and is still feeling good.  She states she has been weighing about every other day and is trying to maintain a low-salt diet.  She does not feel the addition of  losartan has helped with her blood pressure very much.  I will add amlodipine 5 mg daily today and if blood pressure control is still not achieved plan on increasing amlodipine 5 mg to twice daily.  She states she had one episode of chest pain that lasted for about 5 minutes went away spontaneously without intervention.  This was not related to exertion and she believes it was reflux discomfort.  She denies chest pain, shortness of breath, lower extremity edema, fatigue, palpitations, melena, hematuria, hemoptysis, diaphoresis, weakness, presyncope, syncope, orthopnea, and PND.  Past Medical History:  Diagnosis Date  . Acute on chronic diastolic (congestive) heart failure ([place]) 11/21/2018  . Cancer ([place])    skin cancer  . DDD (degenerative disc disease)   . Diverticula of colon   . Diverticulitis   . DJD (degenerative joint disease)   . GERD (gastroesophageal reflux disease)    takes ranitidine  . Heart murmur   . History of hiatal hernia   . History of pneumonia   . Hx of irritable bowel syndrome   . Hyperlipidemia   . Occasional tremors   . Osteoporosis   . Pre-diabetes   . Varicose veins   . Vitamin D deficiency     Past Surgical History:  Procedure Laterality Date  . APPENDECTOMY    . BREAST SURGERY Right    fluid drained from breast  . CARDIOVERSION N/A 12/20/2018   Procedure: CARDIOVERSION;  Surgeon:  Buford Dresser, MD;  [facility] END[facility]COPY;  Service: Cardiovascular;  Laterality: N/A;  . CARDIOVERSION N/A 06/16/2019   Procedure: CARDIOVERSION;  Surgeon: Fay Records, MD;  Location: [place];  Service: Cardiovascular;  Laterality: N/A;  . COLONOSCOPY    . ESOPHAGOGASTRODUODENOSCOPY    . JOINT REPLACEMENT Left    left hip  . LUMBAR LAMINECTOMY/DECOMPRESSION MICRODISCECTOMY N/A 06/05/2015   Procedure: L3-L5 DECOMPRESSION ;  Surgeon: Melina Schools, MD;  Location: [place];  Service: Orthopedics;  Laterality: N/A;  . OPEN REDUCTION INTERNAL FIXATION (ORIF) DISTAL  RADIAL FRACTURE Right 01/10/2014   Procedure: OPEN REDUCTION INTERNAL FIXATION (ORIF) DISTAL RADIAL FRACTURE;  Surgeon: Linna Hoff, MD;  Location: [place];  Service: Orthopedics;  Laterality: Right;  . SPINAL CORD DECOMPRESSION  06/05/2015   L3 L 5  . THYROID SURGERY    . TONSILLECTOMY    . TUBAL LIGATION    . varicose veins stripped      Current Medications: Current Meds  Medication Sig  . acetaminophen (TYLENOL) 500 MG tablet Take 500-1,000 mg by mouth every 6 (six) hours as needed for moderate pain.  Marland Kitchen albuterol (PROVENTIL HFA;VENTOLIN HFA) 108 (90 Base) MCG/ACT inhaler Use 2 inhalations 15-20 minutes apart every 4 hours as needed to rescue Asthma (Patient taking differently: Inhale 2 puffs into the lungs every 4 (four) hours as needed for wheezing. )  . amiodarone (PACERONE) 200 MG tablet Take 1 tablet (200 mg total) by mouth daily.  Marland Kitchen apixaban (ELIQUIS) 5 MG TABS tablet Take 1 tablet (5 mg total) by mouth 2 (two) times daily.  . Ascorbic Acid (VITAMIN C) 1000 MG tablet Take 1,000 mg by mouth daily.   Marland Kitchen aspirin EC 81 MG tablet Take 81 mg by mouth daily.  Marland Kitchen atenolol (TENORMIN) 25 MG tablet Take 1 tablet (25 mg total) by mouth daily.  . bumetanide (BUMEX) 1 MG tablet Take 1/2 to 1 tablet 2 x /day if needed for ankle swelling (Patient taking differently: Take 0.5-1 mg by mouth See admin instructions. Take 1 mg in the morning and 0.5 mg at night)  . cholecalciferol (VITAMIN D3) 25 MCG (1000 UT) tablet Take 1,000 Units by mouth daily.   Marland Kitchen loratadine (CLARITIN) 10 MG tablet Take 10 mg by mouth daily as needed for allergies.  Marland Kitchen losartan (COZAAR) 25 MG tablet Take 1 tablet (25 mg total) by mouth daily.  . Misc Natural Products (OSTEO BI-FLEX JOINT SHIELD PO) Take 1 tablet by mouth 2 (two) times daily.   . Multiple Vitamins-Minerals (PRESERVISION AREDS 2) CAPS Take 1 capsule by mouth 2 (two) times daily.  . Naphazoline-Pheniramine (OPCON-A) 0.027-0.315 % SOLN Place 1 drop into both eyes daily  as needed (allergies).  . terbinafine (LAMISIL) 250 MG tablet Take 1 tablet Daily for Toenail Fungus  . traZODone (DESYREL) 150 MG tablet Take 1/2 to 1 tablet     1 to 2 hours before Bedtime as needed for Sleep     Allergies:   Accupril [quinapril hcl], Neosporin [neomycin-bacitracin zn-polymyx], Prednisone, Reglan [metoclopramide], Tramadol, and Adhesive [tape]   Social History   Socioeconomic History  . Marital status: Widowed    Spouse name: Not on file  . Number of children: Not on file  . Years of education: Not on file  . Highest education level: Not on file  Occupational History  . Not on file  Social Needs  . Financial resource strain: Not on file  . Food insecurity    Worry: Not on file  Inability: Not on file  . Transportation needs    Medical: Not on file    Non-medical: Not on file  Tobacco Use  . Smoking status: Never Smoker  . Smokeless tobacco: Never Used  Substance and Sexual Activity  . Alcohol use: No  . Drug use: No  . Sexual activity: Not on file  Lifestyle  . Physical activity    Days per week: Not on file    Minutes per session: Not on file  . Stress: Not on file  Relationships  . Social Herbalist on phone: Not on file    Gets together: Not on file    Attends religious service: Not on file    Active member of club or organization: Not on file    Attends meetings of clubs or organizations: Not on file    Relationship status: Not on file  Other Topics Concern  . Not on file  Social History Narrative  . Not on file     Family History: The patient's family history includes Cancer in her brother; Heart disease in her sister; Hyperlipidemia in her mother; Hypertension in her mother.  ROS:   Please see the history of present illness.     All other systems reviewed and are negative.  EKGs/Labs/Other Studies Reviewed:     EKG:  EKG is not ordered today.    EKG 06/30/2019 Normal sinus rhythm 67 bpm  EKG 06/16/2019 Sinus  bradycardia 47 bpm  EKG 06/06/2019 atrial fibrillation 76 bpm  EKG 01/10/2019: Atrial fibrillation 74 bpm  EKG 12/28/2018: Atrial fibrillation 111 bpm  EKG 12/20/2018: Sinus bradycardia 58 bpm  Echocardiogram 11/22/2018: Study Conclusions  - Procedure narrative: Transthoracic echocardiography. Image quality was adequate. The study was technically difficult. - Left ventricle: The cavity size was normal. Wall thickness was increased in a pattern of mild LVH. Systolic function was normal. The estimated ejection fraction was in the range of 60% to 65%. Wall motion was normal; there were no regional wall motion abnormalities. The study was not technically sufficient to allow evaluation of LV diastolic dysfunction due to atrial fibrillation. - Aortic valve: There was trivial regurgitation directed eccentrically in the LVOT and towards the mitral anterior leaflet. - Left atrium: The atrium was mildly dilated. - Pulmonic valve: There was trivial regurgitation. - Pericardium, extracardiac: A trivial, free-flowing pericardial effusion was identified circumferential to the heart.  Recent Labs: 11/21/2018: B Natriuretic Peptide 391.6 07/20/2019: ALT 16; BUN 26; Creat 1.31; Hemoglobin 13.0; Magnesium 2.3; Platelets 186; Potassium 4.2; Sodium 142; TSH 1.34  Recent Lipid Panel    Component Value Date/Time   CHOL 215 (H) 07/20/2019 1606   TRIG 108 07/20/2019 1606   HDL 64 07/20/2019 1606   CHOLHDL 3.4 07/20/2019 1606   VLDL 15 05/04/2017 1339   LDLCALC 129 (H) 07/20/2019 1606    Physical Exam:    VS:  BP (!) 158/72   Pulse (!) 49   Temp 98.1 F (36.7 C)   Ht 5' 1.5" (1.562 m)   Wt 158 lb 6.4 oz (71.8 kg)   BMI 29.44 kg/m     Wt Readings from Last 3 Encounters:  08/28/19 158 lb 6.4 oz (71.8 kg)  07/21/19 161 lb 9.6 oz (73.3 kg)  07/20/19 162 lb 6.4 oz (73.7 kg)     GEN:  Well nourished, well developed in no acute distress HEENT: Normal NECK: No JVD; No  carotid bruits LYMPHATICS: No lymphadenopathy CARDIAC: RRR, no murmurs, rubs, gallops RESPIRATORY:  Clear to auscultation without rales, wheezing or rhonchi  ABDOMEN: Soft, non-tender, non-distended MUSCULOSKELETAL:  No edema; No deformity  SKIN: Warm and dry NEUROLOGIC:  Alert and oriented x 3 PSYCHIATRIC:  Normal affect   ASSESSMENT/Plan   1.  Essential hypertension-blood pressure today 158/72.  Labile at home blood pressures as high as 932I systolic and mid 71I diastolic Continue atenolol 25 mg daily Continue losartan 25 mg daily- (given the little response with blood pressure and borderline creatinine.  I would consider discontinuing losartan first with further increases in creatinine.) Start amlodipine 5 mg daily- will consider/plan on increasing this to 5 mg twice daily for better control if needed. Heart healthy low-sodium diet-salty 6 given Maintain physical activity BMP today  2.  Persistent atrial fibrillation- successful DCCV on 06/16/2019 with 1 shock at 200 J.  Heart rate 49 bpm today Continue amiodarone 200 mg tablet daily Continue apixaban 5 mg tablet twice daily Continue atenolol 25 mg tablet daily Change to Bumex  1 tablet every other day Monday, Wednesday, Friday, Saturday- instructed to call office with 3 pounds overnight or 5 pounds in 1 week weight gain. Maintain physical activity as tolerated Low-sodium heart healthy diet CHA2DS2/VAS Stroke Risk Points6 high risk (chf,htn,age, diabetes, female) Instructed to avoid caffeinated beverages and chocolate  3.  Chronic diastolic congestive heart failure- echocardiogram 02/18/8098 diastolic dysfunction due to atrial fibrillation LVEF 60 to 65%.  No peripheral edema today. Change to Bumex  1 tablet every other day Monday, Wednesday, Friday, Saturday- instructed to call office with 3 pounds overnight or 5 pounds in 1 week weight gain.  (She was taking Bumex 1.5 mg daily, patient and daughter educated about risk and dose  change.) Heart healthy low-sodium diet Maintain physical activity  In order of problems listed above:  Essential hypertension  Persistent atrial fibrillation (HCC)  Chronic diastolic CHF (congestive heart failure) (HCC)  Disposition: Follow-up in 2 weeks with APP.   Medication Adjustments/Labs and Tests Ordered: Current medicines are reviewed at length with the patient today.  Concerns regarding medicines are outlined above.  BMP today  Signed, [name] NP-C 08/28/2019 11:47 AM    [place]

## 2019-08-28 ENCOUNTER — Ambulatory Visit: Payer: Medicare Other | Admitting: General Practice

## 2019-08-28 ENCOUNTER — Encounter: Payer: Self-pay | Admitting: Physician Assistant

## 2019-08-28 ENCOUNTER — Other Ambulatory Visit: Payer: Self-pay

## 2019-08-28 VITALS — BP 158/72 | HR 49 | Temp 98.1°F | Ht 61.5 in | Wt 158.4 lb

## 2019-08-28 DIAGNOSIS — I5032 Chronic diastolic (congestive) heart failure: Secondary | ICD-10-CM

## 2019-08-28 DIAGNOSIS — I10 Essential (primary) hypertension: Secondary | ICD-10-CM | POA: Diagnosis not present

## 2019-08-28 DIAGNOSIS — I4819 Other persistent atrial fibrillation: Secondary | ICD-10-CM

## 2019-08-28 MED ORDER — BUMETANIDE 1 MG PO TABS: ORAL_TABLET | ORAL | 0 refills | Status: DC

## 2019-08-28 MED ORDER — AMLODIPINE BESYLATE 5 MG PO TABS: 5.0000 mg | ORAL_TABLET | Freq: Every day | ORAL | 3 refills | Status: DC

## 2019-08-28 NOTE — Patient Instructions (Signed)
Medication Instructions:  Change Bumex: Take 1 tablet every Monday, Wednesday, Friday, and Saturday.  START Amlodipine 5 mg daily.  If you need a refill on your cardiac medications before your next appointment, please call your pharmacy.   Lab work: Your physician recommends that you return for lab work today: BMET  If you have labs (blood work) drawn today and your tests are completely normal, you will receive your results only by: Marland Kitchen MyChart Message (if you have MyChart) OR . A paper copy in the mail If you have any lab test that is abnormal or we need to change your treatment, we will call you to review the results.   Follow-Up: At [facility], you and your health needs are our priority.  As part of our continuing mission to provide you with exceptional heart care, we have created designated Provider Care Teams.  These Care Teams include your primary Cardiologist (physician) and Advanced Practice Providers (APPs -  Physician Assistants and Nurse Practitioners) who all work together to provide you with the care you need, when you need it. . Please schedule a follow-up appointment with [name] in 2 weeks.  Any Other Special Instructions Will Be Listed Below (If Applicable). Please call our office at [phone] for weight gain of 3 lbs overnight or 5 lbs in one week.  Please review [facility] [place] information given to you today.

## 2019-08-29 LAB — BASIC METABOLIC PANEL
BUN/Creatinine Ratio: 17 (ref 12–28)
BUN: 17 mg/dL (ref 8–27)
CO2: 27 mmol/L (ref 20–29)
Calcium: 9.9 mg/dL (ref 8.7–10.3)
Chloride: 104 mmol/L (ref 96–106)
Creatinine, Ser: 1.01 mg/dL — ABNORMAL HIGH (ref 0.57–1.00)
GFR calc Af Amer: 59 mL/min/{1.73_m2} — ABNORMAL LOW (ref >59)
GFR calc non Af Amer: 52 mL/min/{1.73_m2} — ABNORMAL LOW (ref >59)
Glucose: 122 mg/dL — ABNORMAL HIGH (ref 65–99)
Potassium: 4.1 mmol/L (ref 3.5–5.2)
Sodium: 145 mmol/L — ABNORMAL HIGH (ref 134–144)

## 2019-09-12 ENCOUNTER — Other Ambulatory Visit: Payer: Self-pay

## 2019-09-12 ENCOUNTER — Encounter: Payer: Self-pay | Admitting: Cardiology

## 2019-09-12 ENCOUNTER — Ambulatory Visit: Payer: Medicare Other | Admitting: General Practice

## 2019-09-12 VITALS — BP 166/78 | HR 47 | Temp 98.1°F | Ht 61.5 in | Wt 156.0 lb

## 2019-09-12 DIAGNOSIS — I10 Essential (primary) hypertension: Secondary | ICD-10-CM | POA: Diagnosis not present

## 2019-09-12 DIAGNOSIS — I4819 Other persistent atrial fibrillation: Secondary | ICD-10-CM | POA: Diagnosis not present

## 2019-09-12 DIAGNOSIS — I5032 Chronic diastolic (congestive) heart failure: Secondary | ICD-10-CM | POA: Diagnosis not present

## 2019-09-12 MED ORDER — AMLODIPINE BESYLATE 5 MG PO TABS: 5.0000 mg | ORAL_TABLET | Freq: Two times a day (BID) | ORAL | 3 refills | Status: DC

## 2019-09-12 NOTE — Progress Notes (Signed)
Cardiology Clinic Note   Patient Name: Briana [name] Date of Encounter: 09/12/2019  Primary Care Provider:  Unk Pinto, MD Primary Cardiologist:  Shelva Majestic, MD  Patient Profile    Briana [name] 85 year old female presents today for follow-up of her hypertension.  Past Medical History    Past Medical History:  Diagnosis Date  . Acute on chronic diastolic (congestive) heart failure ([place]) 11/21/2018  . Cancer ([place])    skin cancer  . DDD (degenerative disc disease)   . Diverticula of colon   . Diverticulitis   . DJD (degenerative joint disease)   . GERD (gastroesophageal reflux disease)    takes ranitidine  . Heart murmur   . History of hiatal hernia   . History of pneumonia   . Hx of irritable bowel syndrome   . Hyperlipidemia   . Occasional tremors   . Osteoporosis   . Pre-diabetes   . Varicose veins   . Vitamin D deficiency    Past Surgical History:  Procedure Laterality Date  . APPENDECTOMY    . BREAST SURGERY Right    fluid drained from breast  . CARDIOVERSION N/A 12/20/2018   Procedure: CARDIOVERSION;  Surgeon: Buford Dresser, MD;  Location: [facility] ENDOSCOPY;  Service: Cardiovascular;  Laterality: N/A;  . CARDIOVERSION N/A 06/16/2019   Procedure: CARDIOVERSION;  Surgeon: Fay Records, MD;  Location: [place];  Service: Cardiovascular;  Laterality: N/A;  . COLONOSCOPY    . ESOPHAGOGASTRODUODENOSCOPY    . JOINT REPLACEMENT Left    left hip  . LUMBAR LAMINECTOMY/DECOMPRESSION MICRODISCECTOMY N/A 06/05/2015   Procedure: L3-L5 DECOMPRESSION ;  Surgeon: Melina Schools, MD;  Location: [place];  Service: Orthopedics;  Laterality: N/A;  . OPEN REDUCTION INTERNAL FIXATION (ORIF) DISTAL RADIAL FRACTURE Right 01/10/2014   Procedure: OPEN REDUCTION INTERNAL FIXATION (ORIF) DISTAL RADIAL FRACTURE;  Surgeon: Linna Hoff, MD;  Location: [place];  Service: Orthopedics;  Laterality: Right;  . SPINAL CORD DECOMPRESSION  06/05/2015   L3 L 5  . THYROID SURGERY    .  TONSILLECTOMY    . TUBAL LIGATION    . varicose veins stripped      Allergies  Allergies  Allergen Reactions  . Accupril [Quinapril Hcl] Cough  . Neosporin [Neomycin-Bacitracin Zn-Polymyx]     unknown  . Prednisone     Agitated and shaky  . Reglan [Metoclopramide] Other (See Comments)    tremors  . Tramadol Nausea And Vomiting  . Adhesive [Tape] Rash    History of Present Illness    Ms. Briana [name] has a past medical history of essential hypertension, chronic diastolic heart failure, GERD, bilateral sensorineural hearing loss, CKD stage III, hyperlipidemia, vitamin D deficiency, obesity, atrial fibrillation diagnosed January 2020 and lumbar stenosis.  She underwent cardioversion on 12/20/2018 and 06/16/2019.  With her second cardioversion she received 1 shock with 200 J.  She became bradycardic and her atenolol was stopped.  On 06/2019 she was found to be hypertensive and her atenolol was restarted.  An echocardiogram on 11/22/2018 showed an EF of 60 to 65%.  Her initial atrial fibrillation was believed to be brought on by a bout of acute congestive heart failure she was diuresed and discharged home on Bumex, placed on Eliquis for 3 days and underwent her first cardioversion which was on 12/20/2018.  She was last seen by me on 08/28/2019.  During that time she was hypertensive with blood pressures in the 119J to 478G systolic.  Her losartan was continued and amlodipine 5  mg daily was added.  She presents to the clinic today and states her blood pressure has been better managed at home systolic pressures in the 258-527 systolic range.  She feels well and continues to walk regularly at her local church.  She continues to follow a low-sodium diet however, she states that she is eating at the [employer]  this afternoon.  I will provide her with a salty 6 sodium dietary sheet.  She denies chest pain, shortness of breath, lower extremity edema, fatigue, palpitations, melena, hematuria, hemoptysis, diaphoresis,  weakness, presyncope, syncope, orthopnea, and PND.  Home Medications    Prior to Admission medications   Medication Sig Start Date End Date Taking? Authorizing Provider  acetaminophen (TYLENOL) 500 MG tablet Take 500-1,000 mg by mouth every 6 (six) hours as needed for moderate pain.    [provider]  albuterol (PROVENTIL HFA;VENTOLIN HFA) 108 (90 Base) MCG/ACT inhaler Use 2 inhalations 15-20 minutes apart every 4 hours as needed to rescue Asthma Patient taking differently: Inhale 2 puffs into the lungs every 4 (four) hours as needed for wheezing.  12/09/18   Unk Pinto, MD  amiodarone (PACERONE) 200 MG tablet Take 1 tablet (200 mg total) by mouth daily. 01/10/19   Almyra Deforest, PA  amLODipine (NORVASC) 5 MG tablet Take 1 tablet (5 mg total) by mouth daily. 08/28/19 11/26/19  Deberah Pelton, NP  apixaban (ELIQUIS) 5 MG TABS tablet Take 1 tablet (5 mg total) by mouth 2 (two) times daily. 12/15/18   Kroeger, Lorelee Cover., PA-C  Ascorbic Acid (VITAMIN C) 1000 MG tablet Take 1,000 mg by mouth daily.     [provider]  aspirin EC 81 MG tablet Take 81 mg by mouth daily.    [provider]  atenolol (TENORMIN) 25 MG tablet Take 1 tablet (25 mg total) by mouth daily. 07/25/19   Deberah Pelton, NP  bumetanide Cleda Clarks) 1 MG tablet Take 1 tablet by mouth every Monday, Wednesday, Friday, and Saturday. 08/28/19   Deberah Pelton, NP  cholecalciferol (VITAMIN D3) 25 MCG (1000 UT) tablet Take 1,000 Units by mouth daily.     [provider]  loratadine (CLARITIN) 10 MG tablet Take 10 mg by mouth daily as needed for allergies.    [provider]  losartan (COZAAR) 25 MG tablet Take 1 tablet (25 mg total) by mouth daily. 08/14/19 11/12/19  Troy Sine, MD  Misc Natural Products (OSTEO BI-FLEX JOINT SHIELD PO) Take 1 tablet by mouth 2 (two) times daily.     [provider]  Multiple Vitamins-Minerals (PRESERVISION AREDS 2) CAPS Take 1 capsule by mouth 2  (two) times daily.    [provider]  Naphazoline-Pheniramine (OPCON-A) 0.027-0.315 % SOLN Place 1 drop into both eyes daily as needed (allergies).    [provider]  terbinafine (LAMISIL) 250 MG tablet Take 1 tablet Daily for Toenail Fungus 07/20/19   Unk Pinto, MD  traZODone (DESYREL) 150 MG tablet Take 1/2 to 1 tablet     1 to 2 hours before Bedtime as needed for Sleep 07/20/19   Unk Pinto, MD    Family History    Family History  Problem Relation Age of Onset  . Hypertension Mother   . Hyperlipidemia Mother   . Heart disease Sister   . Cancer Brother        prostate   She indicated that her mother is deceased. She indicated that her father is deceased. She indicated that her sister is  deceased. She indicated that her brother is alive.  Social History    Social History   Socioeconomic History  . Marital status: Widowed    Spouse name: Not on file  . Number of children: Not on file  . Years of education: Not on file  . Highest education level: Not on file  Occupational History  . Not on file  Social Needs  . Financial resource strain: Not on file  . Food insecurity    Worry: Not on file    Inability: Not on file  . Transportation needs    Medical: Not on file    Non-medical: Not on file  Tobacco Use  . Smoking status: Never Smoker  . Smokeless tobacco: Never Used  Substance and Sexual Activity  . Alcohol use: No  . Drug use: No  . Sexual activity: Not on file  Lifestyle  . Physical activity    Days per week: Not on file    Minutes per session: Not on file  . Stress: Not on file  Relationships  . Social Herbalist on phone: Not on file    Gets together: Not on file    Attends religious service: Not on file    Active member of club or organization: Not on file    Attends meetings of clubs or organizations: Not on file    Relationship status: Not on file  . Intimate partner violence    Fear of current or ex partner: Not  on file    Emotionally abused: Not on file    Physically abused: Not on file    Forced sexual activity: Not on file  Other Topics Concern  . Not on file  Social History Narrative  . Not on file     Review of Systems    General:  No chills, fever, night sweats or weight changes.  Cardiovascular:  No chest pain, dyspnea on exertion, edema, orthopnea, palpitations, paroxysmal nocturnal dyspnea. Dermatological: No rash, lesions/masses Respiratory: No cough, dyspnea Urologic: No hematuria, dysuria Abdominal:   No nausea, vomiting, diarrhea, bright red blood per rectum, melena, or hematemesis Neurologic:  No visual changes, wkns, changes in mental status. All other systems reviewed and are otherwise negative except as noted above.  Physical Exam    VS:  BP (!) 166/78   Pulse (!) 47   Temp 98.1 F (36.7 C) (Temporal)   Ht 5' 1.5" (1.562 m)   Wt 156 lb (70.8 kg)   SpO2 97%   BMI 29.00 kg/m  , BMI Body mass index is 29 kg/m. GEN: Well nourished, well developed, in no acute distress. HEENT: normal. Neck: Supple, no JVD, carotid bruits, or masses. Cardiac: Sinus bradycardia, no murmurs, rubs, or gallops. No clubbing, cyanosis, edema.  Radials/DP/PT 2+ and equal bilaterally.  Respiratory:  Respirations regular and unlabored, clear to auscultation bilaterally. GI: Soft, nontender, nondistended, BS + x 4. MS: no deformity or atrophy. Skin: warm and dry, no rash. Neuro:  Strength and sensation are intact. Psych: Normal affect.  Accessory Clinical Findings    ECG personally reviewed by me today-none today  EKG 06/30/2019 Normal sinus rhythm 67 bpm  EKG 06/16/2019 Sinus bradycardia 47 bpm  EKG 06/06/2019 atrial fibrillation 76 bpm  EKG 01/10/2019: Atrial fibrillation 74 bpm  EKG 12/28/2018: Atrial fibrillation 111 bpm  EKG 12/20/2018: Sinus bradycardia 58 bpm  Echocardiogram 11/22/2018: Study Conclusions  - Procedure narrative: Transthoracic echocardiography. Image  quality was adequate. The study was technically difficult. -  Left ventricle: The cavity size was normal. Wall thickness was increased in a pattern of mild LVH. Systolic function was normal. The estimated ejection fraction was in the range of 60% to 65%. Wall motion was normal; there were no regional wall motion abnormalities. The study was not technically sufficient to allow evaluation of LV diastolic dysfunction due to atrial fibrillation. - Aortic valve: There was trivial regurgitation directed eccentrically in the LVOT and towards the mitral anterior leaflet. - Left atrium: The atrium was mildly dilated. - Pulmonic valve: There was trivial regurgitation. - Pericardium, extracardiac: A trivial, free-flowing pericardial effusion was identified circumferential to the heart.   Assessment & Plan   1.  Essential hypertension-blood pressure today  166/78. Better controlled at home. Continue atenolol 25 mg daily Continue losartan 25 mg daily- (given the little response with blood pressure and borderline creatinine.  I would consider discontinuing losartan first with further increases in creatinine.) Increase amlodipine 5 mg twice daily Heart healthy low-sodium diet-salty 6 given Maintain physical activity  2. Persistent atrial fibrillation-successful cardioversion on 06/16/2019 with 1 shock at 200 J.Heart rate  47 bpm today Continue amiodarone 200 mg tablet daily Continue apixaban 5 mg tablet twice daily  Continue atenolol 25 mg tablet daily Continue Bumex  1 tablet every other day Monday, Wednesday, Friday, Saturday- instructed to call office with 3 pounds overnight or 5 pounds in 1 week weight gain. Maintain physical activity as tolerated Low-sodium heart healthy diet CHA2DS2/VAS Stroke Risk Points6 high risk (chf,htn,age, diabetes, female) Instructed to avoid caffeinated beverages and chocolate  3. Chronic diastolic congestive heart  failure-echocardiogram 05/21/8831 diastolic dysfunction due to atrial fibrillation LVEF 60 to 65%. No lower extremity edema today.  Weight today 156 pounds down from 158.4 pounds. Continue to Bumex  1 tablet every other day Monday, Wednesday, Friday, Saturday- instructed to call office with 3 pounds overnight or 5 pounds in 1 week weight gain.  Heart healthy low-sodium diet Maintain physical activity  Disposition: Follow-up with App in 2 weeks.  Jossie Ng. [facility] [place] [address] Office [phone] Fax [phone]

## 2019-09-12 NOTE — Patient Instructions (Signed)
Medication Instructions:  START Norvasc 5mg  Take 1 tablet twice a day  *If you need a refill on your cardiac medications before your next appointment, please call your pharmacy*  Lab Work: None  If you have labs (blood work) drawn today and your tests are completely normal, you will receive your results only by: Marland Kitchen MyChart Message (if you have MyChart) OR . A paper copy in the mail If you have any lab test that is abnormal or we need to change your treatment, we will call you to review the results.  Testing/Procedures: None   Follow-Up: At [facility], you and your health needs are our priority.  As part of our continuing mission to provide you with exceptional heart care, we have created designated Provider Care Teams.  These Care Teams include your primary Cardiologist (physician) and Advanced Practice Providers (APPs -  Physician Assistants and Nurse Practitioners) who all work together to provide you with the care you need, when you need it.  Your next appointment:   2 WEEKS  The format for your next appointment:   In Person  Provider:   [name], FNP  Other Instructions

## 2019-09-19 DIAGNOSIS — H00024 Hordeolum internum left upper eyelid: Secondary | ICD-10-CM | POA: Diagnosis not present

## 2019-09-19 DIAGNOSIS — H1045 Other chronic allergic conjunctivitis: Secondary | ICD-10-CM | POA: Diagnosis not present

## 2019-09-19 DIAGNOSIS — H0100A Unspecified blepharitis right eye, upper and lower eyelids: Secondary | ICD-10-CM | POA: Diagnosis not present

## 2019-09-19 DIAGNOSIS — H11122 Conjunctival concretions, left eye: Secondary | ICD-10-CM | POA: Diagnosis not present

## 2019-09-19 DIAGNOSIS — H0100B Unspecified blepharitis left eye, upper and lower eyelids: Secondary | ICD-10-CM | POA: Diagnosis not present

## 2019-09-26 ENCOUNTER — Ambulatory Visit: Payer: Medicare Other | Admitting: General Practice

## 2019-09-26 ENCOUNTER — Telehealth: Payer: Self-pay | Admitting: General Practice

## 2019-09-26 NOTE — Telephone Encounter (Signed)
Briana [name], daughter of the patient wanted to make sure she will be allowed to come back into the clinic with her Mom for her appointment on Thursday 11/12 at 2:15 with [name]. Please contact the daughter if for any reason she would not be allowed back.

## 2019-09-26 NOTE — Telephone Encounter (Signed)
Attempted to contact daughter to discuss visitor policy at the office- LVM and call back number.

## 2019-09-27 DIAGNOSIS — L820 Inflamed seborrheic keratosis: Secondary | ICD-10-CM | POA: Diagnosis not present

## 2019-09-27 NOTE — Progress Notes (Signed)
Cardiology Clinic Note   Patient Name: Briana [name] Date of Encounter: 09/28/2019  Primary Care Provider:  Unk Pinto, MD Primary Cardiologist:  Shelva Majestic, MD  Patient Profile    Briana [name] 85 year old female presents today for follow-up of her hypertension.  Past Medical History    Past Medical History:  Diagnosis Date  . Acute on chronic diastolic (congestive) heart failure ([place]) 11/21/2018  . Cancer ([place])    skin cancer  . DDD (degenerative disc disease)   . Diverticula of colon   . Diverticulitis   . DJD (degenerative joint disease)   . GERD (gastroesophageal reflux disease)    takes ranitidine  . Heart murmur   . History of hiatal hernia   . History of pneumonia   . Hx of irritable bowel syndrome   . Hyperlipidemia   . Occasional tremors   . Osteoporosis   . Pre-diabetes   . Varicose veins   . Vitamin D deficiency    Past Surgical History:  Procedure Laterality Date  . APPENDECTOMY    . BREAST SURGERY Right    fluid drained from breast  . CARDIOVERSION N/A 12/20/2018   Procedure: CARDIOVERSION;  Surgeon: Buford Dresser, MD;  Location: [facility];  Service: Cardiovascular;  Laterality: N/A;  . CARDIOVERSION N/A 06/16/2019   Procedure: CARDIOVERSION;  Surgeon: Fay Records, MD;  Location: [facility];  Service: Cardiovascular;  Laterality: N/A;  . COLONOSCOPY    . ESOPHAGOGASTRODUODENOSCOPY    . JOINT REPLACEMENT Left    left hip  . LUMBAR LAMINECTOMY/DECOMPRESSION MICRODISCECTOMY N/A 06/05/2015   Procedure: L3-L5 DECOMPRESSION ;  Surgeon: Melina Schools, MD;  Location: [facility];  Service: Orthopedics;  Laterality: N/A;  . OPEN REDUCTION INTERNAL FIXATION (ORIF) DISTAL RADIAL FRACTURE Right 01/10/2014   Procedure: OPEN REDUCTION INTERNAL FIXATION (ORIF) DISTAL RADIAL FRACTURE;  Surgeon: Linna Hoff, MD;  Location: [facility];  Service: Orthopedics;  Laterality: Right;  . SPINAL CORD DECOMPRESSION  06/05/2015   L3 L 5  . THYROID SURGERY    .  TONSILLECTOMY    . TUBAL LIGATION    . varicose veins stripped      Allergies  Allergies  Allergen Reactions  . Accupril [Quinapril Hcl] Cough  . Neosporin [Neomycin-Bacitracin Zn-Polymyx]     unknown  . Prednisone     Agitated and shaky  . Reglan [Metoclopramide] Other (See Comments)    tremors  . Tramadol Nausea And Vomiting  . Adhesive [Tape] Rash    History of Present Illness    Briana [name] has a past medical history of essential hypertension, chronic diastolic heart failure, GERD, bilateral sensorineural hearing loss, CKD stage III, hyperlipidemia, vitamin D deficiency, obesity, atrial fibrillation diagnosed January 2020 and lumbar stenosis.  She underwent cardioversion on 12/20/2018 and 06/16/2019.  With her second cardioversion she received 1 shock with 200 J.  She became bradycardic and her atenolol was stopped.  On 06/2019 she was found to be hypertensive and her atenolol was restarted.  An echocardiogram on 11/22/2018 showed an EF of 60 to 65%.  Her initial atrial fibrillation was believed to be brought on by a bout of acute congestive heart failure she was diuresed and discharged home on Bumex, placed on Eliquis for 3 days and underwent her first cardioversion which was on 12/20/2018.  She was last seen by me on 09/12/2019.  During that time she was hypertensive with blood pressures in the 737T and 062I systolic.  Her  amlodipine 5 mg was changed  to BID.She was encouraged to follow a low sodium diet.  She presents to the clinic today and states her blood pressure has been better managed at home with systolic pressures in the 130's and 140's . She feels well and continues to walk regularly at her local church 3 or more times per week.  She states she has been trying to follow her low-sodium diet but occasionally uses "No Salt".   Finally she states she was required to pay $40 with each clinic visit and this is her sixth visit since July.  She asks if she cannot pay her co-pay today.  I  will allow a no charge today but she is informed that if she is seen in the clinic she will need to pay her co-pay.  She denies chest pain, shortness of breath, lower extremity edema, fatigue, palpitations, melena, hematuria, hemoptysis, diaphoresis, weakness, presyncope, syncope, orthopnea, and PND.  Home Medications    Prior to Admission medications   Medication Sig Start Date End Date Taking? Authorizing Provider  acetaminophen (TYLENOL) 500 MG tablet Take 500-1,000 mg by mouth every 6 (six) hours as needed for moderate pain.    [provider]  albuterol (PROVENTIL HFA;VENTOLIN HFA) 108 (90 Base) MCG/ACT inhaler Use 2 inhalations 15-20 minutes apart every 4 hours as needed to rescue Asthma Patient taking differently: Inhale 2 puffs into the lungs every 4 (four) hours as needed for wheezing.  12/09/18   Unk Pinto, MD  amiodarone (PACERONE) 200 MG tablet Take 1 tablet (200 mg total) by mouth daily. 01/10/19   Almyra Deforest, PA  amLODipine (NORVASC) 5 MG tablet Take 1 tablet (5 mg total) by mouth 2 (two) times daily. 09/12/19 01/10/20  Deberah Pelton, NP  apixaban (ELIQUIS) 5 MG TABS tablet Take 1 tablet (5 mg total) by mouth 2 (two) times daily. 12/15/18   Kroeger, Lorelee Cover., PA-C  Ascorbic Acid (VITAMIN C) 1000 MG tablet Take 1,000 mg by mouth daily.     [provider]  aspirin EC 81 MG tablet Take 81 mg by mouth daily.    [provider]  atenolol (TENORMIN) 25 MG tablet Take 1 tablet (25 mg total) by mouth daily. 07/25/19   Deberah Pelton, NP  bumetanide Cleda Clarks) 1 MG tablet Take 1 tablet by mouth every Monday, Wednesday, Friday, and Saturday. 08/28/19   Deberah Pelton, NP  cholecalciferol (VITAMIN D3) 25 MCG (1000 UT) tablet Take 1,000 Units by mouth daily.     [provider]  loratadine (CLARITIN) 10 MG tablet Take 10 mg by mouth daily as needed for allergies.    [provider]  losartan (COZAAR) 25 MG tablet Take 1 tablet (25 mg total) by  mouth daily. 08/14/19 11/12/19  Troy Sine, MD  Misc Natural Products (OSTEO BI-FLEX JOINT SHIELD PO) Take 1 tablet by mouth 2 (two) times daily.     [provider]  Multiple Vitamins-Minerals (PRESERVISION AREDS 2) CAPS Take 1 capsule by mouth 2 (two) times daily.    [provider]  Naphazoline-Pheniramine (OPCON-A) 0.027-0.315 % SOLN Place 1 drop into both eyes daily as needed (allergies).    [provider]  terbinafine (LAMISIL) 250 MG tablet Take 1 tablet Daily for Toenail Fungus 07/20/19   Unk Pinto, MD  traZODone (DESYREL) 150 MG tablet Take 1/2 to 1 tablet     1 to 2 hours before Bedtime as needed for Sleep 07/20/19   Unk Pinto, MD    Family History  Family History  Problem Relation Age of Onset  . Hypertension Mother   . Hyperlipidemia Mother   . Heart disease Sister   . Cancer Brother        prostate   She indicated that her mother is deceased. She indicated that her father is deceased. She indicated that her sister is deceased. She indicated that her brother is alive.  Social History    Social History   Socioeconomic History  . Marital status: Widowed    Spouse name: Not on file  . Number of children: Not on file  . Years of education: Not on file  . Highest education level: Not on file  Occupational History  . Not on file  Social Needs  . Financial resource strain: Not on file  . Food insecurity    Worry: Not on file    Inability: Not on file  . Transportation needs    Medical: Not on file    Non-medical: Not on file  Tobacco Use  . Smoking status: Never Smoker  . Smokeless tobacco: Never Used  Substance and Sexual Activity  . Alcohol use: No  . Drug use: No  . Sexual activity: Not on file  Lifestyle  . Physical activity    Days per week: Not on file    Minutes per session: Not on file  . Stress: Not on file  Relationships  . Social Herbalist on phone: Not on file    Gets together: Not on file     Attends religious service: Not on file    Active member of club or organization: Not on file    Attends meetings of clubs or organizations: Not on file    Relationship status: Not on file  . Intimate partner violence    Fear of current or ex partner: Not on file    Emotionally abused: Not on file    Physically abused: Not on file    Forced sexual activity: Not on file  Other Topics Concern  . Not on file  Social History Narrative  . Not on file     Review of Systems    General:  No chills, fever, night sweats or weight changes.  Cardiovascular:  No chest pain, dyspnea on exertion, edema, orthopnea, palpitations, paroxysmal nocturnal dyspnea. Dermatological: No rash, lesions/masses Respiratory: No cough, dyspnea Urologic: No hematuria, dysuria Abdominal:   No nausea, vomiting, diarrhea, bright red blood per rectum, melena, or hematemesis Neurologic:  No visual changes, wkns, changes in mental status. All other systems reviewed and are otherwise negative except as noted above.  Physical Exam    VS:  BP 132/80   Pulse (!) 52   Ht 5' 1.5" (1.562 m)   Wt 159 lb 12.8 oz (72.5 kg)   BMI 29.70 kg/m  , BMI Body mass index is 29.7 kg/m. GEN: Well nourished, well developed, in no acute distress. HEENT: normal. Neck: Supple, no JVD, carotid bruits, or masses. Cardiac: RRR, no murmurs, rubs, or gallops. No clubbing, cyanosis, edema.  Radials/DP/PT 2+ and equal bilaterally.  Respiratory:  Respirations regular and unlabored, clear to auscultation bilaterally. GI: Soft, nontender, nondistended, BS + x 4. MS: no deformity or atrophy. Skin: warm and dry, no rash. Neuro:  Strength and sensation are intact. Psych: Normal affect.  Accessory Clinical Findings    ECG personally reviewed by me today-sinus bradycardia left axis deviation incomplete right bundle branch block 52 bpm- No acute changes  EKG 06/30/2019 Normal sinus rhythm  67 bpm  EKG 06/16/2019 Sinus bradycardia 47 bpm   EKG 06/06/2019 atrial fibrillation 76 bpm  EKG 01/10/2019: Atrial fibrillation 74 bpm  EKG 12/28/2018: Atrial fibrillation 111 bpm  EKG 12/20/2018: Sinus bradycardia 58 bpm  Echocardiogram 11/22/2018: Study Conclusions  - Procedure narrative: Transthoracic echocardiography. Image quality was adequate. The study was technically difficult. - Left ventricle: The cavity size was normal. Wall thickness was increased in a pattern of mild LVH. Systolic function was normal. The estimated ejection fraction was in the range of 60% to 65%. Wall motion was normal; there were no regional wall motion abnormalities. The study was not technically sufficient to allow evaluation of LV diastolic dysfunction due to atrial fibrillation. - Aortic valve: There was trivial regurgitation directed eccentrically in the LVOT and towards the mitral anterior leaflet. - Left atrium: The atrium was mildly dilated. - Pulmonic valve: There was trivial regurgitation. - Pericardium, extracardiac: A trivial, free-flowing pericardial effusion was identified circumferential to the heart.  Assessment & Plan   1.  Essential hypertension-blood pressure [id number].  Controlled at home. Continue atenolol 25 mg daily Continue losartan 25 mg daily-(given the little response with blood pressure and borderline creatinine. I would consider discontinuing losartan first with further increases in creatinine.) Continue amlodipine 5 mg twice daily Heart healthy low-sodium diet-educated about no salt substitute. Maintain physical activity  2. Persistent atrial fibrillation-successful cardioversion on 06/16/2019 with 1 shock at 200 J. EKG today shows sinus bradycardia 52 bpm. Continue amiodarone 200 mg tablet daily Continue apixaban 5 mg tablet twice daily  Continue atenolol 25 mg tablet daily ContinueBumex 1 tablet every other day Monday, Wednesday, Friday, Saturday-instructed to call office with  3 pounds overnight or 5 pounds in 1 week weight gain. Maintain physical activity as tolerated Low-sodium heart healthy diet CHA2DS2/VAS Stroke Risk Points6 high risk (chf,htn,age, diabetes, female) Instructed to avoid caffeinated beverages and chocolate  3. Chronic diastolic congestive heart failure-echocardiogram 2/0/3559 diastolic dysfunction due to atrial fibrillationLVEF 60 to 65%. No lower extremity edema today.  Weight today 159 pounds down from 156 pounds on 09/12/2019. Continue toBumex 1 tablet every other day Monday, Wednesday, Friday, Saturday-instructed to call office with 3 pounds overnight or 5 pounds in 1 week weight gain.  Heart healthy low-sodium diet Maintain physical activity  Disposition: Follow-up with Dr. Claiborne Billings in 3 months   Jossie Ng. [facility] [place] [address] Office [phone] Fax [phone]

## 2019-09-28 ENCOUNTER — Encounter: Payer: Self-pay | Admitting: General Practice

## 2019-09-28 ENCOUNTER — Other Ambulatory Visit: Payer: Self-pay

## 2019-09-28 ENCOUNTER — Ambulatory Visit (INDEPENDENT_AMBULATORY_CARE_PROVIDER_SITE_OTHER): Payer: Medicare Other | Admitting: General Practice

## 2019-09-28 VITALS — BP 132/80 | HR 52 | Ht 61.5 in | Wt 159.8 lb

## 2019-09-28 DIAGNOSIS — I10 Essential (primary) hypertension: Secondary | ICD-10-CM | POA: Diagnosis not present

## 2019-09-28 DIAGNOSIS — I4819 Other persistent atrial fibrillation: Secondary | ICD-10-CM

## 2019-09-28 DIAGNOSIS — I5032 Chronic diastolic (congestive) heart failure: Secondary | ICD-10-CM

## 2019-09-28 NOTE — Patient Instructions (Signed)
Medication Instructions:  Your physician recommends that you continue on your current medications as directed. Please refer to the Current Medication list given to you today.  *If you need a refill on your cardiac medications before your next appointment, please call your pharmacy*   Follow-Up: At [facility], you and your health needs are our priority.  As part of our continuing mission to provide you with exceptional heart care, we have created designated Provider Care Teams.  These Care Teams include your primary Cardiologist (physician) and Advanced Practice Providers (APPs -  Physician Assistants and Nurse Practitioners) who all work together to provide you with the care you need, when you need it.  Your next appointment:   3 months  The format for your next appointment:   In Person  Provider:   You may see Shelva Majestic, MD or one of the following Advanced Practice Providers on your designated Care Team:    Almyra Deforest, PA-C  Fabian Sharp, PA-C or   Roby Lofts, [place]

## 2019-09-28 NOTE — Telephone Encounter (Signed)
Patient had appointment today.

## 2019-09-29 NOTE — Addendum Note (Signed)
Addended by: Crissie Reese on: 09/29/2019 03:09 PM   Modules accepted: Orders

## 2019-10-02 ENCOUNTER — Telehealth: Payer: Self-pay | Admitting: Cardiovascular Disease

## 2019-10-02 NOTE — Telephone Encounter (Signed)
Advised patient to keep December appointment as it has been a while since seeing MD.  Patient agreed.  Advised if we needed to move already scheduled Feb appointment we could do that.  Patient verbalized understanding.

## 2019-10-02 NOTE — Telephone Encounter (Signed)
Patient had appt with [name] on 09/28/19, he told her to f/u in 34mos but she has an appt with Dr. Claiborne Billings on 10/17/19. She needs to know if she needs to cancel that appt or keep it. Please advise.

## 2019-10-17 ENCOUNTER — Other Ambulatory Visit: Payer: Self-pay

## 2019-10-17 ENCOUNTER — Encounter: Payer: Self-pay | Admitting: Cardiovascular Disease

## 2019-10-17 ENCOUNTER — Ambulatory Visit: Payer: Medicare Other | Admitting: Cardiovascular Disease

## 2019-10-17 VITALS — BP 189/83 | HR 49 | Temp 97.0°F | Ht 61.5 in | Wt 159.0 lb

## 2019-10-17 DIAGNOSIS — I10 Essential (primary) hypertension: Secondary | ICD-10-CM | POA: Diagnosis not present

## 2019-10-17 DIAGNOSIS — I480 Paroxysmal atrial fibrillation: Secondary | ICD-10-CM | POA: Diagnosis not present

## 2019-10-17 DIAGNOSIS — E782 Mixed hyperlipidemia: Secondary | ICD-10-CM

## 2019-10-17 DIAGNOSIS — I5032 Chronic diastolic (congestive) heart failure: Secondary | ICD-10-CM

## 2019-10-17 DIAGNOSIS — Z5181 Encounter for therapeutic drug level monitoring: Secondary | ICD-10-CM

## 2019-10-17 DIAGNOSIS — R001 Bradycardia, unspecified: Secondary | ICD-10-CM

## 2019-10-17 DIAGNOSIS — Z7901 Long term (current) use of anticoagulants: Secondary | ICD-10-CM

## 2019-10-17 MED ORDER — HYDROCHLOROTHIAZIDE 12.5 MG PO CAPS: 12.5000 mg | ORAL_CAPSULE | Freq: Every day | ORAL | 3 refills | Status: DC

## 2019-10-17 MED ORDER — BUMETANIDE 1 MG PO TABS: ORAL_TABLET | ORAL | 3 refills | Status: DC

## 2019-10-17 MED ORDER — LOSARTAN POTASSIUM 50 MG PO TABS: 50.0000 mg | ORAL_TABLET | Freq: Every day | ORAL | 3 refills | Status: DC

## 2019-10-17 NOTE — Progress Notes (Signed)
Cardiology Office Note    Date:  10/17/2019   ID:  Briana [name], DOB Sep 11, 1934, MRN [id number]  PCP:  Unk Pinto, MD  Cardiologist:  Shelva Majestic, MD     Cardiology evaluation, initially referred through the courtesy of Briana Mutters, PA-C for evaluation of chest pain and exertional shortness of breath.  History of Present Illness:  Briana [name] is a 85 y.o. female who was recently been experiencing shortness of breath with exertion and fatigue with nonspecific chest pain.  She had seen Briana Mutters, PA-C and was referred for cardiology evaluation.  I last saw her in October 2018.  She presents with for follow-up evaluation.  Briana [name] has a history of hypertension, hyperlipidemia, varicose veins, status post surgery, and GERD.  Over the past several months she has noticed development of shortness of breath with exertion, fatigue, and has experienced some left upper chest pain radiating to her left shoulder which she describes as pins or sticking into her.  She is able to walk down.  Her long driveway without difficulty.  She denies any specific chest pain with walking and she walks 1 mile at times without difficulty.  She was recently evaluated by Briana [name] and because of this persistent increasing symptomatology in light of her age and risk factors she was referred for cardiology evaluation and consideration for stress testing.  She underwent a nuclear stress test on 08/18/2017.  This was entirely normal and showed an EF of 70%, no ST segment changes and had normal myocardial perfusion without scar or ischemia.  A 2-D echo Doppler study on 08/20/2017 showed an EF of 60-65%.  There was grade 2 diastolic dysfunction.  She had mild aortic insufficiency, mild MR, trivial TR and incidentally a trivial pericardial effusion was identified.  I last saw her in October 2018 she had any recurrent chest pain suggestive of angina.  He did have some right-sided musculoskeletal  costochondral tenderness.  Over the past 2 years, she has been evaluated numerous times with Almyra Deforest, PA, and more recently [name], NP.  She had developed atrial fibrillation in January 2020.  An echo Doppler study on January 6 05/2019 showed an EF of 60 to 65%.  Diastolic function could not be assessed due to the underlying atrial fibrillation.  There was mild left ventricular high hypertrophy.  She did not have any wall motion abnormalities.  There was trivial AR, mild left atrial dilatation she underwent cardioversion on December 20, 2018 but states she only stayed in sinus rhythm for approximately 1 week.  She underwent repeat cardioversion on June 16, 2019 and has been maintaining sinus rhythm since that time.  She has been on amiodarone.  She has had issues with sinus bradycardia.  An initial attempt at stopping her atenolol resulted in exacerbation of her blood pressure control and as result she was later restarted back on atenolol therapy.  She saw [name], NP on September 12, 2019 at which time she was hypertensive and her amlodipine dose was increased to 5 mg twice a day.  She last saw him on September 28, 2019.  The patient has been measuring her blood pressure and pulse rate at home.  Typically her pulse runs in the 50s but there is a rare occasion where it drops to 49.  Her blood pressures recently have been elevated ranging from 140-170.  She denies chest pain PND orthopnea.  She denies bleeding.  She presents for 2-year evaluation with me.  Past  Medical History:  Diagnosis Date  . Acute on chronic diastolic (congestive) heart failure ([place]) 11/21/2018  . Cancer ([place])    skin cancer  . DDD (degenerative disc disease)   . Diverticula of colon   . Diverticulitis   . DJD (degenerative joint disease)   . GERD (gastroesophageal reflux disease)    takes ranitidine  . Heart murmur   . History of hiatal hernia   . History of pneumonia   . Hx of irritable bowel syndrome   .  Hyperlipidemia   . Occasional tremors   . Osteoporosis   . Pre-diabetes   . Varicose veins   . Vitamin D deficiency     Past Surgical History:  Procedure Laterality Date  . APPENDECTOMY    . BREAST SURGERY Right    fluid drained from breast  . CARDIOVERSION N/A 12/20/2018   Procedure: CARDIOVERSION;  Surgeon: Buford Dresser, MD;  [facility] ENDOSCOPY;  Service: Cardiovascular;  Laterality: N/A;  . CARDIOVERSION N/A 06/16/2019   Procedure: CARDIOVERSION;  Surgeon: Fay Records, MD;  Location: [place];  Service: Cardiovascular;  Laterality: N/A;  . COLONOSCOPY    . ESOPHAGOGASTRODUODENOSCOPY    . JOINT REPLACEMENT Left    left hip  . LUMBAR LAMINECTOMY/DECOMPRESSION MICRODISCECTOMY N/A 06/05/2015   Procedure: L3-L5 DECOMPRESSION ;  Surgeon: Melina Schools, MD;  Location: [place];  Service: Orthopedics;  Laterality: N/A;  . OPEN REDUCTION INTERNAL FIXATION (ORIF) DISTAL RADIAL FRACTURE Right 01/10/2014   Procedure: OPEN REDUCTION INTERNAL FIXATION (ORIF) DISTAL RADIAL FRACTURE;  Surgeon: Linna Hoff, MD;  Location: [place];  Service: Orthopedics;  Laterality: Right;  . SPINAL CORD DECOMPRESSION  06/05/2015   L3 L 5  . THYROID SURGERY    . TONSILLECTOMY    . TUBAL LIGATION    . varicose veins stripped      Current Medications: Outpatient Medications Prior to Visit  Medication Sig Dispense Refill  . acetaminophen (TYLENOL) 500 MG tablet Take 500-1,000 mg by mouth every 6 (six) hours as needed for moderate pain.    Marland Kitchen albuterol (PROVENTIL HFA;VENTOLIN HFA) 108 (90 Base) MCG/ACT inhaler Use 2 inhalations 15-20 minutes apart every 4 hours as needed to rescue Asthma (Patient taking differently: Inhale 2 puffs into the lungs every 4 (four) hours as needed for wheezing. ) 3 Inhaler 3  . amiodarone (PACERONE) 200 MG tablet Take 1 tablet (200 mg total) by mouth daily. 90 tablet 3  . amLODipine (NORVASC) 5 MG tablet Take 1 tablet (5 mg total) by mouth 2 (two) times daily. 60 tablet 3   . apixaban (ELIQUIS) 5 MG TABS tablet Take 1 tablet (5 mg total) by mouth 2 (two) times daily. 60 tablet 11  . Ascorbic Acid (VITAMIN C) 1000 MG tablet Take 1,000 mg by mouth daily.     Marland Kitchen atenolol (TENORMIN) 25 MG tablet Take 1 tablet (25 mg total) by mouth daily. 90 tablet 1  . cholecalciferol (VITAMIN D3) 25 MCG (1000 UT) tablet Take 1,000 Units by mouth daily.     Marland Kitchen loratadine (CLARITIN) 10 MG tablet Take 10 mg by mouth daily as needed for allergies.    . Misc Natural Products (OSTEO BI-FLEX JOINT SHIELD PO) Take 1 tablet by mouth 2 (two) times daily.     . Multiple Vitamins-Minerals (PRESERVISION AREDS 2) CAPS Take 1 capsule by mouth 2 (two) times daily.    . Naphazoline-Pheniramine (OPCON-A) 0.027-0.315 % SOLN Place 1 drop into both eyes daily as needed (allergies).    Marland Kitchen  terbinafine (LAMISIL) 250 MG tablet Take 1 tablet Daily for Toenail Fungus 90 tablet 1  . aspirin EC 81 MG tablet Take 81 mg by mouth daily.    . bumetanide (BUMEX) 1 MG tablet Take 1 tablet by mouth every Monday, Wednesday, Friday, and Saturday. 48 tablet 0  . losartan (COZAAR) 25 MG tablet Take 1 tablet (25 mg total) by mouth daily. 90 tablet 3   No facility-administered medications prior to visit.      Allergies:   Accupril [quinapril hcl], Neosporin [neomycin-bacitracin zn-polymyx], Prednisone, Reglan [metoclopramide], Tramadol, and Adhesive [tape]   Social History   Socioeconomic History  . Marital status: Widowed    Spouse name: Not on file  . Number of children: Not on file  . Years of education: Not on file  . Highest education level: Not on file  Occupational History  . Not on file  Social Needs  . Financial resource strain: Not on file  . Food insecurity    Worry: Not on file    Inability: Not on file  . Transportation needs    Medical: Not on file    Non-medical: Not on file  Tobacco Use  . Smoking status: Never Smoker  . Smokeless tobacco: Never Used  Substance and Sexual Activity  . Alcohol  use: No  . Drug use: No  . Sexual activity: Not on file  Lifestyle  . Physical activity    Days per week: Not on file    Minutes per session: Not on file  . Stress: Not on file  Relationships  . Social Herbalist on phone: Not on file    Gets together: Not on file    Attends religious service: Not on file    Active member of club or organization: Not on file    Attends meetings of clubs or organizations: Not on file    Relationship status: Not on file  Other Topics Concern  . Not on file  Social History Narrative  . Not on file     Family History:  The patient's family history includes Cancer in her brother; Heart disease in her sister; Hyperlipidemia in her mother; Hypertension in her mother.   ROS General: Negative; No fevers, chills, or night sweats;  HEENT: Negative; Positive for wearing hearing aids.  No changes in vision, sinus congestion, difficulty swallowing Pulmonary: Positive for when necessary inhalation treatment with albuterol; No recent cough, wheezing, shortness of breath, hemoptysis Cardiovascular: See history of present illness GI: Positive for GERD, history of irritable bowel syndrome GU: Negative; No dysuria, hematuria, or difficulty voiding Musculoskeletal: Straight of lumbar stenosis Hematologic/Oncology: Negative; no easy bruising, bleeding Endocrine: Negative; no heat/cold intolerance; no diabetes Neuro: Negative; no changes in balance, headaches Skin: Negative; No rashes or skin lesions Psychiatric: Negative; No behavioral problems, depression Sleep: Negative; No snoring, daytime sleepiness, hypersomnolence, bruxism, restless legs, hypnogognic hallucinations, no cataplexy Other comprehensive 14 point system review is negative.   PHYSICAL EXAM:   VS:  BP (!) 189/83   Pulse (!) 49   Temp (!) 97 F (36.1 C)   Ht 5' 1.5" (1.562 m)   Wt 159 lb (72.1 kg)   SpO2 99%   BMI 29.56 kg/m     Repeat blood pressure by me was 186/84.  Wt  Readings from Last 3 Encounters:  10/17/19 159 lb (72.1 kg)  09/28/19 159 lb 12.8 oz (72.5 kg)  09/12/19 156 lb (70.8 kg)    General: Alert, oriented, no distress.  Skin: normal turgor, no rashes, warm and dry HEENT: Normocephalic, atraumatic. Pupils equal round and reactive to light; sclera anicteric; extraocular muscles intact;  Nose without nasal septal hypertrophy Mouth/Parynx benign; Mallinpatti scale 3 Neck: No JVD, no carotid bruits; normal carotid upstroke Lungs: clear to ausculatation and percussion; no wheezing or rales Chest wall: without tenderness to palpitation Heart: PMI not displaced, RRR, s1 s2 normal, 1/6 systolic murmur, no diastolic murmur, no rubs, gallops, thrills, or heaves Abdomen: soft, nontender; no hepatosplenomehaly, BS+; abdominal aorta nontender and not dilated by palpation. Back: no CVA tenderness Pulses 2+ Musculoskeletal: full range of motion, normal strength, no joint deformities Extremities: trivial left ankle edema no clubbing cyanosis, Homan's sign negative  Neurologic: grossly nonfocal; Cranial nerves grossly wnl Psychologic: Normal mood and affect   Studies/Labs Reviewed:   ECG (independently read by me): Sinus bradycardia at 48 bpm.  Incomplete right bundle branch block.  Left anterior hemiblock.  Q waves anteroseptally  October 2018 EKG:  EKG is ordered today.  Normal sinus rhythm at 62 bpm with PAC.  Incomplete right bundle branch block.  Normal intervals  September 2018 ECG (independently read by me):Normal sinus rhythm at 63 bpm.  Incomplete right bundle branch block.  Poor anterior R-wave progression.  Normal intervals.  Recent Labs: BMP Latest Ref Rng & Units 08/28/2019 07/20/2019 06/09/2019  Glucose 65 - 99 mg/dL 122(H) 142(H) 99  BUN 8 - 27 mg/dL 17 26(H) 22  Creatinine 0.57 - 1.00 mg/dL 1.01(H) 1.31(H) 1.18(H)  BUN/Creat Ratio 12 - '28 17 20 19  ' Sodium 134 - 144 mmol/L 145(H) 142 143  Potassium 3.5 - 5.2 mmol/L 4.1 4.2 4.4  Chloride  96 - 106 mmol/L 104 104 101  CO2 20 - 29 mmol/L '27 29 26  ' Calcium 8.7 - 10.3 mg/dL 9.9 9.6 9.8     Hepatic Function Latest Ref Rng & Units 07/20/2019 04/05/2019 12/29/2018  Total Protein 6.1 - 8.1 g/dL 6.2 6.7 6.5  Albumin 3.6 - 5.1 g/dL - - -  AST 10 - 35 U/L '21 20 19  ' ALT 6 - 29 U/L '16 19 20  ' Alk Phosphatase 33 - 130 U/L - - -  Total Bilirubin 0.2 - 1.2 mg/dL 0.6 0.7 1.0  Bilirubin, Direct 0.0 - 0.2 mg/dL - - -    CBC Latest Ref Rng & Units 07/20/2019 06/09/2019 04/05/2019  WBC 3.8 - 10.8 Thousand/uL 6.3 6.2 5.8  Hemoglobin 11.7 - 15.5 g/dL 13.0 14.3 13.8  Hematocrit 35.0 - 45.0 % 39.7 42.1 42.9  Platelets 140 - 400 Thousand/uL 186 221 198   Lab Results  Component Value Date   MCV 91.1 07/20/2019   MCV 89 06/09/2019   MCV 92.3 04/05/2019   Lab Results  Component Value Date   TSH 1.34 07/20/2019   Lab Results  Component Value Date   HGBA1C 6.5 (H) 07/20/2019     BNP    Component Value Date/Time   BNP 391.6 (H) 11/21/2018 1028    ProBNP No results found for: PROBNP   Lipid Panel     Component Value Date/Time   CHOL 215 (H) 07/20/2019 1606   TRIG 108 07/20/2019 1606   HDL 64 07/20/2019 1606   CHOLHDL 3.4 07/20/2019 1606   VLDL 15 05/04/2017 1339   LDLCALC 129 (H) 07/20/2019 1606     RADIOLOGY: No results found.   Additional studies/ records that were reviewed today include:  I reviewed the records from Briana [name], [place]    ASSESSMENT:  1. Essential hypertension   2. Paroxysmal atrial fibrillation (HCC)   3. Chronic diastolic CHF (congestive heart failure) ([place])   4. Hyperlipidemia   5. Alteration in anticoagulation   6. Sinus bradycardia     PLAN:  Ms. Oluwatobi Ruppe is a very pleasant 85 year old female who has a longstanding history of hypertension as well as a history of upper lipidemia, prior varicose vein surgery, GERD, and in the past had developed musculoskeletal chest wall pain.  Since I last saw her in October 2018, she apparently  developed atrial fibrillation and required cardioversion Imes to this year with most recent cardioversion on July 31 successful.  Presently she is maintaining sinus rhythm is bradycardic on a regimen consisting of amiodarone 200 mg daily, in addition to atenolol 25 mg daily.  He has been on Eliquis as well as baby aspirin.  She does not have underlying known CAD.  I have recommended she discontinue aspirin and continue with Eliquis 5 mg twice a day alone to reduce bleed risk.  She is significantly hypertensive today and I reviewed her monitoring log from home which is concordant with continued hypertension.  I reviewed with her the most recent hypertensive guidelines.  Presently she is on amlodipine 5 mg twice a day, atenolol 25 mg daily, losartan 25 mg apparently has been taking Bumex 1 mg 4 days/week..  I have suggested that initially she increase losartan to 50 mg we will increase her Bumex to 5 days/week.  She denies any episodes of dizziness.  However, if her resting pulse is below 50 I have suggested she reduce her atenolol and only take 12.5 mg on those days.  She has a history of intermittent asthma and takes albuterol on a as needed basis.  She will be seeing Briana Mutters, PA-C on November 01, 2019 at that time blood pressure and pulse rate can be reassessed.  Her echo Doppler study in January 2020 had shown normal systolic function and this was done in the setting of atrial fibrillation so diastolic dysfunction could not be assessed.  However, remotely on a prior echo she had been shown to have grade 2 diastolic dysfunction.  Laboratory done by Dr. Melford Aase in September 2020 had shown hyperlipidemia with total cholesterol 215 LDL cholesterol 129 with HDL 64 and triglycerides 108.  Creatinine in October 2020 was 1.01.  I will see her in February 2021 for follow-up evaluation.   Medication Adjustments/Labs and Tests Ordered: Current medicines are reviewed at length with the patient today.  Concerns  regarding medicines are outlined above.  Medication changes, Labs and Tests ordered today are listed in the Patient Instructions below. Patient Instructions  Medication Instructions:  Increase Losartan to 50 mg daily Stop Aspirin Increase Bumex 1 mg  5 times a week  ( Mon-Tue-Wed-Thurs-Fri ) Continue all other medications  *If you need a refill on your cardiac medications before your next appointment, please call your pharmacy*  Lab Work: None ordered   Testing/Procedures: None ordered  Follow-Up: At [facility], you and your health needs are our priority.  As part of our continuing mission to provide you with exceptional heart care, we have created designated Provider Care Teams.  These Care Teams include your primary Cardiologist (physician) and Advanced Practice Providers (APPs -  Physician Assistants and Nurse Practitioners) who all work together to provide you with the care you need, when you need it.  Your next appointment:  Keep appointment  Friday 12/29/19 at 1:20 pm  The format for your  next appointment:  Office   Provider:  Dr.    If Pulse less than 50 take 1/2 tablet of Atenolol    Signed, Shelva Majestic, MD  10/17/2019 5:55 PM    [facility] [address], [place], [place], [place]  [address] Phone: [phone]

## 2019-10-17 NOTE — Patient Instructions (Addendum)
Medication Instructions:  Increase Losartan to 50 mg daily Stop Aspirin Increase Bumex 1 mg  5 times a week  ( Mon-Tue-Wed-Thurs-Fri ) Continue all other medications  *If you need a refill on your cardiac medications before your next appointment, please call your pharmacy*  Lab Work: None ordered   Testing/Procedures: None ordered  Follow-Up: At [facility], you and your health needs are our priority.  As part of our continuing mission to provide you with exceptional heart care, we have created designated Provider Care Teams.  These Care Teams include your primary Cardiologist (physician) and Advanced Practice Providers (APPs -  Physician Assistants and Nurse Practitioners) who all work together to provide you with the care you need, when you need it.  Your next appointment:  Keep appointment  Friday 12/29/19 at 1:20 pm  The format for your next appointment:  Office   Provider:  Dr.Kelly    If Pulse less than 50 take 1/2 tablet of Atenolol

## 2019-10-26 ENCOUNTER — Ambulatory Visit: Payer: Medicare Other | Admitting: Physician Assistant

## 2019-11-01 ENCOUNTER — Ambulatory Visit: Payer: Medicare Other | Admitting: Physician Assistant

## 2019-11-15 DIAGNOSIS — D6869 Other thrombophilia: Secondary | ICD-10-CM | POA: Insufficient documentation

## 2019-11-15 NOTE — Progress Notes (Signed)
MEDICARE ANNUAL WELLNESS VISIT AND FOLLOW UP  Assessment:   Encounter for Medicare annual wellness exam  A. Fib No concerns with excessive bleeding, no falls Continue medication: elquis Followed by cardiology NSR today  CHF Weights stable, appears euvolemic Disease process and medications discussed. Questions answered fully.  Essential hypertension Continue medication: propanolol Monitor blood pressure at home; call if consistently over 130/80 Continue DASH diet.   Reminder to go to the ER if any CP, SOB, nausea, dizziness, severe HA, changes vision/speech, left arm numbness and tingling and jaw pain.  GERD Well managed on current medications Discussed diet, avoiding triggers and other lifestyle changes  Age-related osteoporosis without current pathological fracture Continue exercises, calcium and vitamin D   Vitamin D deficiency At goal at recent check; continue to recommend supplementation for goal of 70-100  vitamin D level  Diabetes with hyperlipidemia Discussed disease and risks Discussed diet/exercise, weight management  A1C  Diabetes with CKD stage 3 Discussed general issues about diabetes pathophysiology and management., Educational material distributed., Suggested low cholesterol diet., Encouraged aerobic exercise., Discussed foot care., Reminded to get yearly retinal exam.  Medication management CBC, CMP/GFR, magnesium   Spinal stenosis of lumbar region without neurogenic claudication + right back pain into her leg, suggest follow up with ortho  Hyperlipidemia Currently treated by lifestyle with borderline control Continue low cholesterol diet and exercise.  Check lipid panel.   Fibrocystic breast disease (FCBD), unspecified laterality Continue annual mammogram screening- discussed at some point we can stop getting MGM's however patient is very active, very good quality of life, will continue for now.   CKD (chronic kidney disease) stage 3, GFR 30-59  ml/min (HCC) Increase fluids, avoid NSAIDS, monitor sugars, will monitor CMP/GFR  Needs flu shot -     Flu vaccine HIGH DOSE PF (Fluzone High dose)    Over 40 minutes of exam, counseling, chart review and critical decision making was performed Future Appointments  Date Time Provider [place]  12/29/2019  1:20 PM Troy Sine, MD [facility]  02/28/2020  3:00 PM Unk Pinto, MD [facility] None     Plan:   During the course of the visit the patient was educated and counseled about appropriate screening and preventive services including:    Pneumococcal vaccine   Prevnar 13  Influenza vaccine  Td vaccine  Screening electrocardiogram  Bone densitometry screening  Colorectal cancer screening  Diabetes screening  Glaucoma screening  Nutrition counseling   Advanced directives: requested   Subjective:  Briana [name] is a 85 y.o. female who presents for Medicare Annual Wellness Visit and 3 month follow up.   She lives by herself, still drives, does her own medications, her daughter are helping her some. She has a shower with shower seat, no issues with dressing her self or cooking.   Patient is also followed by Dr Nelva Bush with [place] for Lumbar spinal stenosis and had L3/L5 SS decompression surg in July 2016 by Dr Apolonio Schneiders.   She also has hereditary essential tremors.   She has seen ortho for right knee, had some fluid removed from her knee. Had blood in the fluid, and she is on eliquis and bASA, will stop the bASA.   She is newly diagnosed with a. Fib; on amiodarone and elequis, cardizem 240 mg and atenolol.   Followed by Dr. Claiborne Billings.   BMI is Body mass index is 28.96 kg/m., she has been working on diet (avoiding fried foods) and exercise. Wt Readings from Last 3 Encounters:  11/20/19 155 lb 12.8 oz (70.7 kg)  10/17/19 159 lb (72.1 kg)  09/28/19 159 lb 12.8 oz (72.5 kg)    Her blood pressure has been controlled at home, today their BP is BP:  120/64 She does NOT workout, has been out of town and has had left knee pain. She denies chest pain, shortness of breath, dizziness.     She has been working on diet and exercise for diabetes With CKD on ARB With hyperlipidemia not on cholesterol medication, not at goal less than 70 and denies foot ulcerations, increased appetite, nausea, paresthesia of the feet, polydipsia, polyuria, visual disturbances, vomiting and weight loss.  Last A1C in the office was:  Lab Results  Component Value Date   HGBA1C 6.5 (H) 07/20/2019   Lab Results  Component Value Date   CHOL 215 (H) 07/20/2019   HDL 64 07/20/2019   LDLCALC 129 (H) 07/20/2019   TRIG 108 07/20/2019   CHOLHDL 3.4 07/20/2019   Last GFR: Lab Results  Component Value Date   GFRNONAA 52 (L) 08/28/2019   Patient is on Vitamin D supplement and at goal at recent check:    Lab Results  Component Value Date   VD25OH 37 07/20/2019      Medication Review:   Current Outpatient Medications (Cardiovascular):  .  amiodarone (PACERONE) 200 MG tablet, Take 1 tablet (200 mg total) by mouth daily. Marland Kitchen  amLODipine (NORVASC) 5 MG tablet, Take 1 tablet (5 mg total) by mouth 2 (two) times daily. Marland Kitchen  atenolol (TENORMIN) 25 MG tablet, Take 1 tablet (25 mg total) by mouth daily. .  bumetanide (BUMEX) 1 MG tablet, Take 1 mg tablet   5 times a week  ( Mon-Tue-Wed-Thurs-Fri ) .  losartan (COZAAR) 50 MG tablet, Take 1 tablet (50 mg total) by mouth daily.  Current Outpatient Medications (Respiratory):  .  albuterol (PROVENTIL HFA;VENTOLIN HFA) 108 (90 Base) MCG/ACT inhaler, Use 2 inhalations 15-20 minutes apart every 4 hours as needed to rescue Asthma (Patient taking differently: Inhale 2 puffs into the lungs every 4 (four) hours as needed for wheezing. ) .  loratadine (CLARITIN) 10 MG tablet, Take 10 mg by mouth daily as needed for allergies.  Current Outpatient Medications (Analgesics):  .  acetaminophen (TYLENOL) 500 MG tablet, Take 500-1,000 mg by  mouth every 6 (six) hours as needed for moderate pain.  Current Outpatient Medications (Hematological):  .  apixaban (ELIQUIS) 5 MG TABS tablet, Take 1 tablet (5 mg total) by mouth 2 (two) times daily.  Current Outpatient Medications (Other):  Marland Kitchen  Ascorbic Acid (VITAMIN C) 1000 MG tablet, Take 1,000 mg by mouth daily.  .  cholecalciferol (VITAMIN D3) 25 MCG (1000 UT) tablet, Take 1,000 Units by mouth daily.  .  Misc Natural Products (OSTEO BI-FLEX JOINT SHIELD PO), Take 1 tablet by mouth 2 (two) times daily.  .  Multiple Vitamins-Minerals (PRESERVISION AREDS 2) CAPS, Take 1 capsule by mouth 2 (two) times daily. .  Naphazoline-Pheniramine (OPCON-A) 0.027-0.315 % SOLN, Place 1 drop into both eyes daily as needed (allergies). .  terbinafine (LAMISIL) 250 MG tablet, Take 1 tablet Daily for Toenail Fungus   Allergies  Allergen Reactions  . Accupril [Quinapril Hcl] Cough  . Neosporin [Neomycin-Bacitracin Zn-Polymyx]     unknown  . Prednisone     Agitated and shaky  . Reglan [Metoclopramide] Other (See Comments)    tremors  . Tramadol Nausea And Vomiting  . Adhesive [Tape] Rash    Current Problems (verified) Patient  Active Problem List   Diagnosis Date Noted  . Acquired thrombophilia ([place]) 11/15/2019  . Chronic diastolic heart failure ([place]) 04/03/2019  . Unspecified atrial fibrillation ([place]) 11/21/2018  . CKD stage 3 due to type 2 diabetes mellitus ([place]) 02/23/2018  . Bilateral sensorineural hearing loss 07/02/2017  . Obesity (BMI 30.0-34.9) 09/05/2015  . Lumbar stenosis 06/05/2015  . Medication management 06/13/2014  . GERD 11/20/2013  . Osteopenia 11/20/2013  . Essential hypertension 11/19/2013  . Hyperlipidemia 11/19/2013  . Type 2 diabetes mellitus with hyperlipidemia ([place]) 11/19/2013  . Vitamin D deficiency 11/19/2013  . Diffuse cystic mastopathy 11/19/2013    Screening Tests Immunization History  Administered Date(s) Administered  . DT (Pediatric) 09/05/2015  . DTaP  11/16/2004  . Influenza Whole 08/30/2013  . Influenza, High Dose Seasonal PF 08/28/2015, 08/10/2016  . Pneumococcal Conjugate-13 10/19/2016  . Pneumococcal Polysaccharide-23 11/16/2001  . Zoster 06/01/2013   Preventative care: Last colonoscopy: 2011 Mammogram: 12/2018 DEXA: 2019 - T - 1.6 radius   Prior vaccinations: TD or Tdap: 2016  Influenza: 2017, would like to get the flu shot  Pneumococcal: 2003 Prevnar13: 2017 Shingles/Zostavax: 2014  Names of Other Physician/Practitioners you currently use: 1. [facility] here for primary care 2. Dr. Katy Fitch, eye doctor, last visit 2020 3. Dr. Eliezer Bottom & Luretha Rued, dentist, last visit 2019  Patient Care Team: Unk Pinto, MD as PCP - General (Internal Medicine) Troy Sine, MD as PCP - Cardiology (Cardiology) Iran Planas, MD as Consulting Physician (Orthopedic Surgery) Ladene Artist, MD as Consulting Physician (Gastroenterology) Suella Broad, MD as Consulting Physician (Physical Medicine and Rehabilitation) Rolm Bookbinder, MD as Consulting Physician (Dermatology) Buford Dresser, MD as Consulting Physician (Cardiology)  SURGICAL HISTORY She  has a past surgical history that includes Thyroid surgery; varicose veins stripped; Tonsillectomy; Appendectomy; Tubal ligation; Open reduction internal fixation (orif) distal radial fracture (Right, 01/10/2014); Colonoscopy; Esophagogastroduodenoscopy; Breast surgery (Right); Joint replacement (Left); Spinal cord decompression (06/05/2015); Lumbar laminectomy/decompression microdiscectomy (N/A, 06/05/2015); Cardioversion (N/A, 12/20/2018); and Cardioversion (N/A, 06/16/2019). FAMILY HISTORY Her family history includes Cancer in her brother; Heart disease in her sister; Hyperlipidemia in her mother; Hypertension in her mother. SOCIAL HISTORY She  reports that she has never smoked. She has never used smokeless tobacco. She reports that she does not drink  alcohol or use drugs.   MEDICARE WELLNESS OBJECTIVES: Physical activity:   Cardiac risk factors:   Depression/mood screen:   Depression screen [facility] 2/9 07/20/2019  Decreased Interest 0  Down, Depressed, Hopeless 0  PHQ - 2 Score 0    ADLs:  In your present state of health, do you have any difficulty performing the following activities: 07/20/2019 04/05/2019  Hearing? N N  Comment - bilateral hearing aids working well   Vision? N N  Difficulty concentrating or making decisions? N N  Walking or climbing stairs? N N  Comment - uses rail on steps on home and does well   Dressing or bathing? N N  Doing errands, shopping? N N  Preparing Food and eating ? - N  Using the Toilet? - N  In the past six months, have you accidently leaked urine? - N  Do you have problems with loss of bowel control? - N  Managing your Medications? - N  Managing your Finances? - N  Comment - daughter helps when needed   Housekeeping or managing your Housekeeping? - N  Some recent data might be hidden     Cognitive Testing  Alert? Yes  Normal  Appearance?Yes  Oriented to person? Yes  Place? Yes   Time? Yes  Recall of three objects?  Yes  Can perform simple calculations? Yes  Displays appropriate judgment?Yes  Can read the correct time from a watch face?Yes  EOL planning: Does Patient Have a Medical Advance Directive?: No Would patient like information on creating a medical advance directive?: Yes (MAU/Ambulatory/Procedural Areas - Information given) No  Review of Systems  Constitutional: Negative for malaise/fatigue and weight loss.  HENT: Negative for hearing loss and tinnitus.   Eyes: Negative for blurred vision and double vision.  Respiratory: Negative for cough, sputum production, shortness of breath and wheezing.   Cardiovascular: Negative for chest pain, palpitations, orthopnea, claudication, leg swelling and PND.  Gastrointestinal: Negative for abdominal pain, blood in stool, constipation,  diarrhea, heartburn, melena, nausea and vomiting.  Genitourinary: Negative.   Musculoskeletal: Negative for falls, joint pain and myalgias.  Skin: Negative for rash.  Neurological: Negative for dizziness, tingling, sensory change, weakness and headaches.  Endo/Heme/Allergies: Negative for polydipsia.  Psychiatric/Behavioral: Negative.  Negative for depression, memory loss, substance abuse and suicidal ideas. The patient is not nervous/anxious and does not have insomnia.   All other systems reviewed and are negative.    Objective:     Today's Vitals   11/20/19 1609  BP: 120/64  Pulse: 61  Temp: 97.6 F (36.4 C)  SpO2: 95%  Weight: 155 lb 12.8 oz (70.7 kg)  Height: 5' 1.5" (1.562 m)  PainSc: 7   PainLoc: Knee   Body mass index is 28.96 kg/m.  General appearance: alert, no distress, WD/WN, female HEENT: normocephalic, sclerae anicteric, TMs pearly, nares patent, no discharge or erythema, pharynx normal Oral cavity: MMM, no lesions Neck: supple, no lymphadenopathy, no thyromegaly, no masses Heart: Irregularly irregular, normal S1, S2, no murmurs Lungs: CTA bilaterally, no wheezes, rhonchi, or rales Abdomen: +bs, soft, non tender, non distended, no masses, no hepatomegaly, no splenomegaly Musculoskeletal: nontender, no swelling, no obvious deformity Extremities: no edema, no cyanosis, no clubbing Pulses: 2+ symmetric, upper and lower extremities, normal cap refill Neurological: alert, oriented x 3, CN2-12 intact, strength normal upper extremities and lower extremities, sensation normal throughout, DTRs 2+ throughout, no cerebellar signs, gait normal Psychiatric: normal affect, behavior normal, pleasant   Medicare Attestation I have personally reviewed: The patient's medical and social history Their use of alcohol, tobacco or illicit drugs Their current medications and supplements The patient's functional ability including ADLs,fall risks, home safety risks, cognitive, and  hearing and visual impairment Diet and physical activities Evidence for depression or mood disorders  The patient's weight, height, BMI, and visual acuity have been recorded in the chart.  I have made referrals, counseling, and provided education to the patient based on review of the above and I have provided the patient with a written personalized care plan for preventive services.     Vicie Mutters, PA-C   11/20/2019

## 2019-11-16 DIAGNOSIS — M25561 Pain in right knee: Secondary | ICD-10-CM | POA: Diagnosis not present

## 2019-11-16 DIAGNOSIS — M25061 Hemarthrosis, right knee: Secondary | ICD-10-CM | POA: Diagnosis not present

## 2019-11-20 ENCOUNTER — Ambulatory Visit (INDEPENDENT_AMBULATORY_CARE_PROVIDER_SITE_OTHER): Payer: Medicare Other | Admitting: Physician Assistant

## 2019-11-20 ENCOUNTER — Encounter: Payer: Self-pay | Admitting: Physician Assistant

## 2019-11-20 ENCOUNTER — Ambulatory Visit: Payer: Medicare Other | Admitting: Physician Assistant

## 2019-11-20 ENCOUNTER — Other Ambulatory Visit: Payer: Self-pay

## 2019-11-20 VITALS — BP 120/64 | HR 61 | Temp 97.6°F | Ht 61.5 in | Wt 155.8 lb

## 2019-11-20 DIAGNOSIS — Z0001 Encounter for general adult medical examination with abnormal findings: Secondary | ICD-10-CM | POA: Diagnosis not present

## 2019-11-20 DIAGNOSIS — N183 Chronic kidney disease, stage 3 unspecified: Secondary | ICD-10-CM

## 2019-11-20 DIAGNOSIS — Z23 Encounter for immunization: Secondary | ICD-10-CM

## 2019-11-20 DIAGNOSIS — R6889 Other general symptoms and signs: Secondary | ICD-10-CM | POA: Diagnosis not present

## 2019-11-20 DIAGNOSIS — I480 Paroxysmal atrial fibrillation: Secondary | ICD-10-CM

## 2019-11-20 DIAGNOSIS — Z79899 Other long term (current) drug therapy: Secondary | ICD-10-CM

## 2019-11-20 DIAGNOSIS — E559 Vitamin D deficiency, unspecified: Secondary | ICD-10-CM

## 2019-11-20 DIAGNOSIS — I10 Essential (primary) hypertension: Secondary | ICD-10-CM

## 2019-11-20 DIAGNOSIS — H903 Sensorineural hearing loss, bilateral: Secondary | ICD-10-CM

## 2019-11-20 DIAGNOSIS — E669 Obesity, unspecified: Secondary | ICD-10-CM | POA: Diagnosis not present

## 2019-11-20 DIAGNOSIS — E782 Mixed hyperlipidemia: Secondary | ICD-10-CM

## 2019-11-20 DIAGNOSIS — E1122 Type 2 diabetes mellitus with diabetic chronic kidney disease: Secondary | ICD-10-CM | POA: Diagnosis not present

## 2019-11-20 DIAGNOSIS — M48061 Spinal stenosis, lumbar region without neurogenic claudication: Secondary | ICD-10-CM

## 2019-11-20 DIAGNOSIS — E1169 Type 2 diabetes mellitus with other specified complication: Secondary | ICD-10-CM

## 2019-11-20 DIAGNOSIS — I5032 Chronic diastolic (congestive) heart failure: Secondary | ICD-10-CM

## 2019-11-20 DIAGNOSIS — E785 Hyperlipidemia, unspecified: Secondary | ICD-10-CM | POA: Diagnosis not present

## 2019-11-20 DIAGNOSIS — D6869 Other thrombophilia: Secondary | ICD-10-CM

## 2019-11-20 DIAGNOSIS — M85831 Other specified disorders of bone density and structure, right forearm: Secondary | ICD-10-CM

## 2019-11-20 DIAGNOSIS — N6019 Diffuse cystic mastopathy of unspecified breast: Secondary | ICD-10-CM | POA: Diagnosis not present

## 2019-11-20 DIAGNOSIS — E66811 Obesity, class 1: Secondary | ICD-10-CM

## 2019-11-20 NOTE — Patient Instructions (Addendum)
Aims to exercise AT least 10 mins a day, this helps circulate blood and in a study has decreased dementia risk.  Keep your mind engaged! Try meditation and relaxation techniques Do not smoke or drink alcohol  Ice your knee Use voltern gel 3 - 4 times a day on that knee Follow up with ortho if not better   Sciatica Rehab- BUT FOLLOW UP WITH ORTHO CAN TAKE TYLENOL  Ask your health care provider which exercises are safe for you. Do exercises exactly as told by your health care provider and adjust them as directed. It is normal to feel mild stretching, pulling, tightness, or discomfort as you do these exercises. Stop right away if you feel sudden pain or your pain gets worse. Do not begin these exercises until told by your health care provider. Stretching and range-of-motion exercises These exercises warm up your muscles and joints and improve the movement and flexibility of your hips and back. These exercises also help to relieve pain, numbness, and tingling. Sciatic nerve glide 1. Sit in a chair with your head facing down toward your chest. Place your hands behind your back. Let your shoulders slump forward. 2. Slowly straighten one of your legs while you tilt your head back as if you are looking toward the ceiling. Only straighten your leg as far as you can without making your symptoms worse. 3. Hold this position for __________ seconds. 4. Slowly return to the starting position. 5. Repeat with your other leg. Repeat __________ times. Complete this exercise __________ times a day. Knee to chest with hip adduction and internal rotation  1. Lie on your back on a firm surface with both legs straight. 2. Bend one of your knees and move it up toward your chest until you feel a gentle stretch in your lower back and buttock. Then, move your knee toward the shoulder that is on the opposite side from your leg. This is hip adduction and internal rotation. ? Hold your leg in this position by holding  on to the front of your knee. 3. Hold this position for __________ seconds. 4. Slowly return to the starting position. 5. Repeat with your other leg. Repeat __________ times. Complete this exercise __________ times a day. Prone extension on elbows  1. Lie on your abdomen on a firm surface. A bed may be too soft for this exercise. 2. Prop yourself up on your elbows. 3. Use your arms to help lift your chest up until you feel a gentle stretch in your abdomen and your lower back. ? This will place some of your body weight on your elbows. If this is uncomfortable, try stacking pillows under your chest. ? Your hips should stay down, against the surface that you are lying on. Keep your hip and back muscles relaxed. 4. Hold this position for __________ seconds. 5. Slowly relax your upper body and return to the starting position. Repeat __________ times. Complete this exercise __________ times a day. Strengthening exercises These exercises build strength and endurance in your back. Endurance is the ability to use your muscles for a long time, even after they get tired. Pelvic tilt This exercise strengthens the muscles that lie deep in the abdomen. 1. Lie on your back on a firm surface. Bend your knees and keep your feet flat on the floor. 2. Tense your abdominal muscles. Tip your pelvis up toward the ceiling and flatten your lower back into the floor. ? To help with this exercise, you may place a small  towel under your lower back and try to push your back into the towel. 3. Hold this position for __________ seconds. 4. Let your muscles relax completely before you repeat this exercise. Repeat __________ times. Complete this exercise __________ times a day. Alternating arm and leg raises  1. Get on your hands and knees on a firm surface. If you are on a hard floor, you may want to use padding, such as an exercise mat, to cushion your knees. 2. Line up your arms and legs. Your hands should be  directly below your shoulders, and your knees should be directly below your hips. 3. Lift your left leg behind you. At the same time, raise your right arm and straighten it in front of you. ? Do not lift your leg higher than your hip. ? Do not lift your arm higher than your shoulder. ? Keep your abdominal and back muscles tight. ? Keep your hips facing the ground. ? Do not arch your back. ? Keep your balance carefully, and do not hold your breath. 4. Hold this position for __________ seconds. 5. Slowly return to the starting position. 6. Repeat with your right leg and your left arm. Repeat __________ times. Complete this exercise __________ times a day. Posture and body mechanics Good posture and healthy body mechanics can help to relieve stress in your body's tissues and joints. Body mechanics refers to the movements and positions of your body while you do your daily activities. Posture is part of body mechanics. Good posture means:  Your spine is in its natural S-curve position (neutral).  Your shoulders are pulled back slightly.  Your head is not tipped forward. Follow these guidelines to improve your posture and body mechanics in your everyday activities. Standing   When standing, keep your spine neutral and your feet about hip width apart. Keep a slight bend in your knees. Your ears, shoulders, and hips should line up.  When you do a task in which you stand in one place for a long time, place one foot up on a stable object that is 2-4 inches (5-10 cm) high, such as a footstool. This helps keep your spine neutral. Sitting   When sitting, keep your spine neutral and keep your feet flat on the floor. Use a footrest, if necessary, and keep your thighs parallel to the floor. Avoid rounding your shoulders, and avoid tilting your head forward.  When working at a desk or a computer, keep your desk at a height where your hands are slightly lower than your elbows. Slide your chair under  your desk so you are close enough to maintain good posture.  When working at a computer, place your monitor at a height where you are looking straight ahead and you do not have to tilt your head forward or downward to look at the screen. Resting  When lying down and resting, avoid positions that are most painful for you.  If you have pain with activities such as sitting, bending, stooping, or squatting, lie in a position in which your body does not bend very much. For example, avoid curling up on your side with your arms and knees near your chest (fetal position).  If you have pain with activities such as standing for a long time or reaching with your arms, lie with your spine in a neutral position and bend your knees slightly. Try the following positions: ? Lying on your side with a pillow between your knees. ? Lying on your back with  a pillow under your knees. Lifting   When lifting objects, keep your feet at least shoulder width apart and tighten your abdominal muscles.  Bend your knees and hips and keep your spine neutral. It is important to lift using the strength of your legs, not your back. Do not lock your knees straight out.  Always ask for help to lift heavy or awkward objects. This information is not intended to replace advice given to you by your health care provider. Make sure you discuss any questions you have with your health care provider. Document Revised: 02/24/2019 Document Reviewed: 11/24/2018 Elsevier Patient Education  2020 [place]  Know what a healthy weight is for you (roughly BMI <25) and aim to maintain this  Aim for 7+ servings of fruits and vegetables daily  70-80+ fluid ounces of water or unsweet tea for healthy kidneys  Limit to max 1 drink of alcohol per day; avoid smoking/tobacco  Limit animal fats in diet for cholesterol and heart health - choose grass fed whenever available  Avoid highly processed foods, and foods  high in saturated/trans fats  Aim for low stress - take time to unwind and care for your mental health  Aim for 150 min of moderate intensity exercise weekly for heart health, and weights twice weekly for bone health  Aim for 7-9 hours of sleep daily

## 2019-11-21 LAB — CBC WITH DIFFERENTIAL/PLATELET
Absolute Monocytes: 708 cells/uL (ref 200–950)
Basophils Absolute: 30 cells/uL (ref 0–200)
Basophils Relative: 0.5 %
Eosinophils Absolute: 254 cells/uL (ref 15–500)
Eosinophils Relative: 4.3 %
HCT: 41.9 % (ref 35.0–45.0)
Hemoglobin: 13.8 g/dL (ref 11.7–15.5)
Lymphs Abs: 956 cells/uL (ref 850–3900)
MCH: 30.1 pg (ref 27.0–33.0)
MCHC: 32.9 g/dL (ref 32.0–36.0)
MCV: 91.5 fL (ref 80.0–100.0)
MPV: 12 fL (ref 7.5–12.5)
Monocytes Relative: 12 %
Neutro Abs: 3953 cells/uL (ref 1500–7800)
Neutrophils Relative %: 67 %
Platelets: 207 10*3/uL (ref 140–400)
RBC: 4.58 10*6/uL (ref 3.80–5.10)
RDW: 13.6 % (ref 11.0–15.0)
Total Lymphocyte: 16.2 %
WBC: 5.9 10*3/uL (ref 3.8–10.8)

## 2019-11-21 LAB — HEMOGLOBIN A1C
Hgb A1c MFr Bld: 6.2 % of total Hgb — ABNORMAL HIGH (ref <5.7)
Mean Plasma Glucose: 131 (calc)
eAG (mmol/L): 7.3 (calc)

## 2019-11-21 LAB — COMPLETE METABOLIC PANEL WITH GFR
AG Ratio: 2.2 (calc) (ref 1.0–2.5)
ALT: 13 U/L (ref 6–29)
AST: 19 U/L (ref 10–35)
Albumin: 4.3 g/dL (ref 3.6–5.1)
Alkaline phosphatase (APISO): 87 U/L (ref 37–153)
BUN/Creatinine Ratio: 20 (calc) (ref 6–22)
BUN: 20 mg/dL (ref 7–25)
CO2: 27 mmol/L (ref 20–32)
Calcium: 9.4 mg/dL (ref 8.6–10.4)
Chloride: 106 mmol/L (ref 98–110)
Creat: 0.99 mg/dL — ABNORMAL HIGH (ref 0.60–0.88)
GFR, Est African American: 61 mL/min/{1.73_m2} (ref >=60)
GFR, Est Non African American: 53 mL/min/{1.73_m2} — ABNORMAL LOW (ref >=60)
Globulin: 2 g/dL (calc) (ref 1.9–3.7)
Glucose, Bld: 114 mg/dL — ABNORMAL HIGH (ref 65–99)
Potassium: 4.4 mmol/L (ref 3.5–5.3)
Sodium: 140 mmol/L (ref 135–146)
Total Bilirubin: 0.5 mg/dL (ref 0.2–1.2)
Total Protein: 6.3 g/dL (ref 6.1–8.1)

## 2019-11-21 LAB — LIPID PANEL
Cholesterol: 214 mg/dL — ABNORMAL HIGH (ref <200)
HDL: 60 mg/dL (ref >=50)
LDL Cholesterol (Calc): 134 mg/dL (calc) — ABNORMAL HIGH
Non-HDL Cholesterol (Calc): 154 mg/dL (calc) — ABNORMAL HIGH (ref <130)
Total CHOL/HDL Ratio: 3.6 (calc) (ref <5.0)
Triglycerides: 102 mg/dL (ref <150)

## 2019-11-21 LAB — MAGNESIUM: Magnesium: 2.2 mg/dL (ref 1.5–2.5)

## 2019-11-21 LAB — TSH: TSH: 1.13 mIU/L (ref 0.40–4.50)

## 2019-11-21 LAB — VITAMIN D 25 HYDROXY (VIT D DEFICIENCY, FRACTURES): Vit D, 25-Hydroxy: 37 ng/mL (ref 30–100)

## 2019-11-22 DIAGNOSIS — L821 Other seborrheic keratosis: Secondary | ICD-10-CM | POA: Diagnosis not present

## 2019-11-22 DIAGNOSIS — L918 Other hypertrophic disorders of the skin: Secondary | ICD-10-CM | POA: Diagnosis not present

## 2019-11-22 DIAGNOSIS — Z85828 Personal history of other malignant neoplasm of skin: Secondary | ICD-10-CM | POA: Diagnosis not present

## 2019-11-22 DIAGNOSIS — L570 Actinic keratosis: Secondary | ICD-10-CM | POA: Diagnosis not present

## 2019-11-22 DIAGNOSIS — L814 Other melanin hyperpigmentation: Secondary | ICD-10-CM | POA: Diagnosis not present

## 2019-11-22 DIAGNOSIS — L820 Inflamed seborrheic keratosis: Secondary | ICD-10-CM | POA: Diagnosis not present

## 2019-12-14 ENCOUNTER — Other Ambulatory Visit: Payer: Self-pay | Admitting: Physician Assistant

## 2019-12-18 ENCOUNTER — Other Ambulatory Visit: Payer: Self-pay | Admitting: Medical

## 2019-12-19 DIAGNOSIS — H25813 Combined forms of age-related cataract, bilateral: Secondary | ICD-10-CM | POA: Diagnosis not present

## 2019-12-19 DIAGNOSIS — H40013 Open angle with borderline findings, low risk, bilateral: Secondary | ICD-10-CM | POA: Diagnosis not present

## 2019-12-19 DIAGNOSIS — E119 Type 2 diabetes mellitus without complications: Secondary | ICD-10-CM | POA: Diagnosis not present

## 2019-12-19 DIAGNOSIS — H353131 Nonexudative age-related macular degeneration, bilateral, early dry stage: Secondary | ICD-10-CM | POA: Diagnosis not present

## 2019-12-19 DIAGNOSIS — H18591 Other hereditary corneal dystrophies, right eye: Secondary | ICD-10-CM | POA: Diagnosis not present

## 2019-12-29 ENCOUNTER — Encounter (INDEPENDENT_AMBULATORY_CARE_PROVIDER_SITE_OTHER): Payer: Self-pay

## 2019-12-29 ENCOUNTER — Encounter: Payer: Self-pay | Admitting: Cardiovascular Disease

## 2019-12-29 ENCOUNTER — Other Ambulatory Visit: Payer: Self-pay

## 2019-12-29 ENCOUNTER — Ambulatory Visit: Payer: Medicare Other | Admitting: Cardiovascular Disease

## 2019-12-29 DIAGNOSIS — E782 Mixed hyperlipidemia: Secondary | ICD-10-CM | POA: Diagnosis not present

## 2019-12-29 DIAGNOSIS — I5032 Chronic diastolic (congestive) heart failure: Secondary | ICD-10-CM

## 2019-12-29 DIAGNOSIS — I480 Paroxysmal atrial fibrillation: Secondary | ICD-10-CM | POA: Diagnosis not present

## 2019-12-29 DIAGNOSIS — I10 Essential (primary) hypertension: Secondary | ICD-10-CM | POA: Diagnosis not present

## 2019-12-29 DIAGNOSIS — R001 Bradycardia, unspecified: Secondary | ICD-10-CM

## 2019-12-29 DIAGNOSIS — Z7901 Long term (current) use of anticoagulants: Secondary | ICD-10-CM

## 2019-12-29 MED ORDER — LOSARTAN POTASSIUM 50 MG PO TABS: 75.0000 mg | ORAL_TABLET | Freq: Every day | ORAL | 3 refills | Status: DC

## 2019-12-29 MED ORDER — ATENOLOL 25 MG PO TABS: 12.5000 mg | ORAL_TABLET | Freq: Every day | ORAL | 1 refills | Status: DC

## 2019-12-29 MED ORDER — AMLODIPINE BESYLATE 5 MG PO TABS: 7.5000 mg | ORAL_TABLET | Freq: Every day | ORAL | 3 refills | Status: DC

## 2019-12-29 NOTE — Progress Notes (Signed)
Cardiology Office Note    Date:  12/31/2019   ID:  Briana [name], DOB [birth year], MRN [id number]  PCP:  Unk Pinto, MD  Cardiologist:  Shelva Majestic, MD   F/U cardiology evaluation, initially referred through the courtesy of Vicie Mutters, PA-C for evaluation of chest pain and exertional shortness of breath.  History of Present Illness:  Briana [name] is a 86 y.o. female who presents for a 36-monthfollow-up evaluation.  Ms. KVerrethas a history of hypertension, hyperlipidemia, varicose veins, status post surgery, and GERD.  Over the past several months she has noticed development of shortness of breath with exertion, fatigue, and has experienced some left upper chest pain radiating to her left shoulder which she describes as pins or sticking into her.  She is able to walk down.  Her long driveway without difficulty.  She denies any specific chest pain with walking and she walks 1 mile at times without difficulty.  She was recently evaluated by AVicie Muttersand because of this persistent increasing symptomatology in light of her age and risk factors she was referred for cardiology evaluation and consideration for stress testing.  She underwent a nuclear stress test on 08/18/2017.  This was entirely normal and showed an EF of 70%, no ST segment changes and had normal myocardial perfusion without scar or ischemia.  A 2-D echo Doppler study on 08/20/2017 showed an EF of 60-65%.  There was grade 2 diastolic dysfunction.  She had mild aortic insufficiency, mild MR, trivial TR and incidentally a trivial pericardial effusion was identified.  I  saw her in October 2018 at which time she did not have  any recurrent chest pain suggestive of angina.  He did have some right-sided musculoskeletal costochondral tenderness.  Over the past 2 years, she has been evaluated numerous times with HAlmyra Deforest PA, and more recently JColetta Memos NP.  She had developed atrial fibrillation in January 2020.  An echo  Doppler study on January 6, /2020 showed an EF of 60 to 65%.  Diastolic function could not be assessed due to the underlying atrial fibrillation.  There was mild left ventricular high hypertrophy.  She did not have any wall motion abnormalities.  There was trivial AR, mild left atrial dilatation she underwent cardioversion on December 20, 2018 but states she only stayed in sinus rhythm for approximately 1 week.  She underwent repeat cardioversion on June 16, 2019 and has been maintaining sinus rhythm since that time.  She has been on amiodarone.  She has had issues with sinus bradycardia.  An initial attempt at stopping her atenolol resulted in exacerbation of her blood pressure control and as result she was later restarted back on atenolol therapy.  She saw JColetta Memos NP on September 12, 2019 at which time she was hypertensive and her amlodipine dose was increased to 5 mg twice a day.  She last saw him on September 28, 2019.    After not having seen her in over 2 years, I saw her from 05/17/2019.  She  has been measuring her blood pressure and pulse rate at home.  Typically her pulse runs in the 50s but there is a rare occasion where it drops to 49.  Her blood pressures recently have been elevated ranging from 140 - 170.  She denied chest pain PND orthopnea.  She denied bleeding.  During that evaluation I recommended that if her resting pulse is below 50 she reduce atenolol to 12.5 mg.  Over the  past several months, she has continued to have labile blood pressure with blood pressure readings at home typically ranging from 140 up to 147W systolically.  She denies any anginal type symptoms.  She denies any dizziness.  She had undergone laboratory in January 2021 by her primary physician which showed hemoglobin 13.8 hematocrit 41.9.  Glucose was 114.  Creatinine 0.99.  LFTs were normal.  TSH was 1.13.  Lipid studies showed a total cholesterol 214, HDL 60, triglycerides 102, and LDL 134.  She presents for evaluation.   Past Medical History:  Diagnosis Date  . Acute on chronic diastolic (congestive) heart failure ([place]) 11/21/2018  . Cancer ([place])    skin cancer  . DDD (degenerative disc disease)   . Diverticula of colon   . Diverticulitis   . DJD (degenerative joint disease)   . GERD (gastroesophageal reflux disease)    takes ranitidine  . Heart murmur   . History of hiatal hernia   . History of pneumonia   . Hx of irritable bowel syndrome   . Hyperlipidemia   . Occasional tremors   . Osteoporosis   . Pre-diabetes   . Varicose veins   . Vitamin D deficiency     Past Surgical History:  Procedure Laterality Date  . APPENDECTOMY    . BREAST SURGERY Right    fluid drained from breast  . CARDIOVERSION N/A 12/20/2018   Procedure: CARDIOVERSION;  Surgeon: Buford Dresser, MD;  Location: [facility] ENDOSCOPY;  Service: Cardiovascular;  Laterality: N/A;  . CARDIOVERSION N/A 06/16/2019   Procedure: CARDIOVERSION;  Surgeon: Fay Records, MD;  Location: [place];  Service: Cardiovascular;  Laterality: N/A;  . COLONOSCOPY    . ESOPHAGOGASTRODUODENOSCOPY    . JOINT REPLACEMENT Left    left hip  . LUMBAR LAMINECTOMY/DECOMPRESSION MICRODISCECTOMY N/A 06/05/2015   Procedure: L3-L5 DECOMPRESSION ;  Surgeon: Melina Schools, MD;  Location: [place];  Service: Orthopedics;  Laterality: N/A;  . OPEN REDUCTION INTERNAL FIXATION (ORIF) DISTAL RADIAL FRACTURE Right 01/10/2014   Procedure: OPEN REDUCTION INTERNAL FIXATION (ORIF) DISTAL RADIAL FRACTURE;  Surgeon: Linna Hoff, MD;  Location: [place];  Service: Orthopedics;  Laterality: Right;  . SPINAL CORD DECOMPRESSION  06/05/2015   L3 L 5  . THYROID SURGERY    . TONSILLECTOMY    . TUBAL LIGATION    . varicose veins stripped      Current Medications: Outpatient Medications Prior to Visit  Medication Sig Dispense Refill  . acetaminophen (TYLENOL) 500 MG tablet Take 500-1,000 mg by mouth every 6 (six) hours as needed for moderate pain.    Marland Kitchen albuterol (PROVENTIL  HFA;VENTOLIN HFA) 108 (90 Base) MCG/ACT inhaler Use 2 inhalations 15-20 minutes apart every 4 hours as needed to rescue Asthma (Patient taking differently: Inhale 2 puffs into the lungs every 4 (four) hours as needed for wheezing. ) 3 Inhaler 3  . Ascorbic Acid (VITAMIN C) 1000 MG tablet Take 1,000 mg by mouth daily.     . bumetanide (BUMEX) 1 MG tablet Take 1 mg tablet   5 times a week  ( Mon-Tue-Wed-Thurs-Fri ) 90 tablet 3  . cholecalciferol (VITAMIN D3) 25 MCG (1000 UT) tablet Take 1,000 Units by mouth daily.     Marland Kitchen ELIQUIS 5 MG TABS tablet Take 1 tablet by mouth twice daily 60 tablet 0  . loratadine (CLARITIN) 10 MG tablet Take 10 mg by mouth daily as needed for allergies.    . Misc Natural Products (OSTEO BI-FLEX JOINT SHIELD PO) Take 1  tablet by mouth 2 (two) times daily.     . Multiple Vitamins-Minerals (PRESERVISION AREDS 2) CAPS Take 1 capsule by mouth 2 (two) times daily.    . Naphazoline-Pheniramine (OPCON-A) 0.027-0.315 % SOLN Place 1 drop into both eyes daily as needed (allergies).    Marland Kitchen PACERONE 200 MG tablet TAKE 2 TABLETS BY MOUTH TWICE DAILY FOR 7 DAYS THEN DECREASE TO  1 TABLET ONCE DAILY 90 tablet 3  . terbinafine (LAMISIL) 250 MG tablet Take 1 tablet Daily for Toenail Fungus 90 tablet 1  . amLODipine (NORVASC) 5 MG tablet Take 1 tablet (5 mg total) by mouth 2 (two) times daily. 60 tablet 3  . atenolol (TENORMIN) 25 MG tablet Take 1 tablet (25 mg total) by mouth daily. 90 tablet 1  . losartan (COZAAR) 50 MG tablet Take 1 tablet (50 mg total) by mouth daily. 90 tablet 3   No facility-administered medications prior to visit.     Allergies:   Accupril [quinapril hcl], Neosporin [neomycin-bacitracin zn-polymyx], Prednisone, Reglan [metoclopramide], Tramadol, and Adhesive [tape]   Social History   Socioeconomic History  . Marital status: Widowed    Spouse name: Not on file  . Number of children: Not on file  . Years of education: Not on file  . Highest education level: Not on  file  Occupational History  . Not on file  Tobacco Use  . Smoking status: Never Smoker  . Smokeless tobacco: Never Used  Substance and Sexual Activity  . Alcohol use: No  . Drug use: No  . Sexual activity: Not on file  Other Topics Concern  . Not on file  Social History Narrative  . Not on file   Social Determinants of Health   Financial Resource Strain:   . Difficulty of Paying Living Expenses: Not on file  Food Insecurity:   . Worried About Charity fundraiser in the Last Year: Not on file  . Ran Out of Food in the Last Year: Not on file  Transportation Needs:   . Lack of Transportation (Medical): Not on file  . Lack of Transportation (Non-Medical): Not on file  Physical Activity:   . Days of Exercise per Week: Not on file  . Minutes of Exercise per Session: Not on file  Stress:   . Feeling of Stress : Not on file  Social Connections:   . Frequency of Communication with Friends and Family: Not on file  . Frequency of Social Gatherings with Friends and Family: Not on file  . Attends Religious Services: Not on file  . Active Member of Clubs or Organizations: Not on file  . Attends Archivist Meetings: Not on file  . Marital Status: Not on file     Family History:  The patient's family history includes Cancer in her brother; Heart disease in her sister; Hyperlipidemia in her mother; Hypertension in her mother.   ROS General: Negative; No fevers, chills, or night sweats;  HEENT: Negative; Positive for wearing hearing aids.  No changes in vision, sinus congestion, difficulty swallowing Pulmonary: Positive for when necessary inhalation treatment with albuterol; No recent cough, wheezing, shortness of breath, hemoptysis Cardiovascular: See history of present illness GI: Positive for GERD, history of irritable bowel syndrome GU: Negative; No dysuria, hematuria, or difficulty voiding Musculoskeletal: Straight of lumbar stenosis Hematologic/Oncology: Negative; no  easy bruising, bleeding Endocrine: Negative; no heat/cold intolerance; no diabetes Neuro: Negative; no changes in balance, headaches Skin: Negative; No rashes or skin lesions Psychiatric: Negative; No behavioral  problems, depression Sleep: Negative; No snoring, daytime sleepiness, hypersomnolence, bruxism, restless legs, hypnogognic hallucinations, no cataplexy Other comprehensive 14 point system review is negative.   PHYSICAL EXAM:   VS:  BP (!) 177/80   Pulse (!) 46   Temp (!) 97.3 F (36.3 C)   Ht '5\' 1"'  (1.549 m)   Wt 156 lb 11.2 oz (71.1 kg)   SpO2 94%   BMI 29.61 kg/m     Repeat blood pressure by me was 168/80  Wt Readings from Last 3 Encounters:  12/29/19 156 lb 11.2 oz (71.1 kg)  11/20/19 155 lb 12.8 oz (70.7 kg)  10/17/19 159 lb (72.1 kg)      Physical Exam BP (!) 177/80   Pulse (!) 46   Temp (!) 97.3 F (36.3 C)   Ht '5\' 1"'  (1.549 m)   Wt 156 lb 11.2 oz (71.1 kg)   SpO2 94%   BMI 29.61 kg/m  General: Alert, oriented, no distress.  Skin: normal turgor, no rashes, warm and dry HEENT: Normocephalic, atraumatic. Pupils equal round and reactive to light; sclera anicteric; extraocular muscles intact;  Nose without nasal septal hypertrophy Mouth/Parynx benign; Mallinpatti scale Neck: No JVD, no carotid bruits; normal carotid upstroke Lungs: clear to ausculatation and percussion; no wheezing or rales Chest wall: without tenderness to palpitation Heart: PMI not displaced, RRR, s1 s2 normal, 1/6 systolic murmur, no diastolic murmur, no rubs, gallops, thrills, or heaves Abdomen: soft, nontender; no hepatosplenomehaly, BS+; abdominal aorta nontender and not dilated by palpation. Back: no CVA tenderness Pulses 2+ Musculoskeletal: full range of motion, normal strength, no joint deformities Extremities: no clubbing cyanosis or edema, Homan's sign negative  Neurologic: grossly nonfocal; Cranial nerves grossly wnl Psychologic: Normal mood and affect   Studies/Labs  Reviewed:   ECG (independently read by me): Sinus bradycardia at 46 bpm.  Left axis deviation.  Mild RV conduction delay.  QS V1 V3.  QTc interval 462 ms.  October 17, 2019 ECG (independently read by me): Sinus bradycardia at 48 bpm.  Incomplete right bundle branch block.  Left anterior hemiblock.  Q waves anteroseptally  October 2018 EKG:  EKG is ordered today.  Normal sinus rhythm at 62 bpm with PAC.  Incomplete right bundle branch block.  Normal intervals  September 2018 ECG (independently read by me):Normal sinus rhythm at 63 bpm.  Incomplete right bundle branch block.  Poor anterior R-wave progression.  Normal intervals.  Recent Labs: BMP Latest Ref Rng & Units 11/20/2019 08/28/2019 07/20/2019  Glucose 65 - 99 mg/dL 114(H) 122(H) 142(H)  BUN 7 - 25 mg/dL 20 17 26(H)  Creatinine 0.60 - 0.88 mg/dL 0.99(H) 1.01(H) 1.31(H)  BUN/Creat Ratio 6 - 22 (calc) '20 17 20  ' Sodium 135 - 146 mmol/L 140 145(H) 142  Potassium 3.5 - 5.3 mmol/L 4.4 4.1 4.2  Chloride 98 - 110 mmol/L 106 104 104  CO2 20 - 32 mmol/L '27 27 29  ' Calcium 8.6 - 10.4 mg/dL 9.4 9.9 9.6     Hepatic Function Latest Ref Rng & Units 11/20/2019 07/20/2019 04/05/2019  Total Protein 6.1 - 8.1 g/dL 6.3 6.2 6.7  Albumin 3.6 - 5.1 g/dL - - -  AST 10 - 35 U/L '19 21 20  ' ALT 6 - 29 U/L '13 16 19  ' Alk Phosphatase 33 - 130 U/L - - -  Total Bilirubin 0.2 - 1.2 mg/dL 0.5 0.6 0.7  Bilirubin, Direct 0.0 - 0.2 mg/dL - - -    CBC Latest Ref Rng & Units 11/20/2019  07/20/2019 06/09/2019  WBC 3.8 - 10.8 Thousand/uL 5.9 6.3 6.2  Hemoglobin 11.7 - 15.5 g/dL 13.8 13.0 14.3  Hematocrit 35.0 - 45.0 % 41.9 39.7 42.1  Platelets 140 - 400 Thousand/uL 207 186 221   Lab Results  Component Value Date   MCV 91.5 11/20/2019   MCV 91.1 07/20/2019   MCV 89 06/09/2019   Lab Results  Component Value Date   TSH 1.13 11/20/2019   Lab Results  Component Value Date   HGBA1C 6.2 (H) 11/20/2019     BNP    Component Value Date/Time   BNP 391.6 (H) 11/21/2018  1028    ProBNP No results found for: PROBNP   Lipid Panel     Component Value Date/Time   CHOL 214 (H) 11/20/2019 1717   TRIG 102 11/20/2019 1717   HDL 60 11/20/2019 1717   CHOLHDL 3.6 11/20/2019 1717   VLDL 15 05/04/2017 1339   LDLCALC 134 (H) 11/20/2019 1717     RADIOLOGY: No results found.   Additional studies/ records that were reviewed today include:  I reviewed the records from Vicie Mutters, [place]    ASSESSMENT:    1. Essential hypertension   2. Sinus bradycardia   3. Paroxysmal atrial fibrillation (HCC)   4. Chronic diastolic CHF (congestive heart failure) ([place])   5. Hyperlipidemia   6. Anticoagulated     PLAN:  Ms. Wilsie Kern is a very pleasant 86 year-old female who has a longstanding history of hypertension as well as a history of hyperlipidemia, prior varicose vein surgery, GERD, and in the past had developed musculoskeletal chest wall pain.  Since I last saw her in October 2018, she  developed atrial fibrillation and required cardioversion initially in February 2020 and subsequently on June 16, 2019.  When I saw her in December 2020 she was maintaining sinus rhythm on a regimen consisting of amiodarone 200 mg daily, in addition to atenolol 25 mg daily.  She was bradycardic and I recommended potential reduction of a atenolol.  She has been on Eliquis as well as baby aspirin.  She does not have underlying known CAD and I recommended she discontinue aspirin and continue with Eliquis 5 mg twice a day alone to reduce bleed risk.  At her December evaluation, she was significantly hypertensive and I reviewed her monitoring log from home which is concordant with continued hypertension.  I reviewed with her the most recent hypertensive guidelines.  She was on amlodipine 5 mg twice a day, atenolol 25 mg daily, losartan 25 mg apparently has been taking Bumex 1 mg 4 days/week..  I have suggested that initially she increase losartan to 50 mg we will increase her Bumex to 5  days/week.  She denies any episodes of dizziness.  However, if her resting pulse is below 50 I have suggested she reduce her atenolol and only take 12.5 mg on those days.  She has a history of intermittent asthma and takes albuterol on a as needed basis.  Over the past several months, she has continued to have blood pressure lability.  On ECG today she is significantly bradycardic with a pulse of 46.  I have recommended decreasing atenolol to 12.5 mg.  She continues to be on amiodarone 200 mg.  With her continued blood pressure elevation I am recommending further titration of losartan to 75 mg daily and amlodipine to 7.5 mg.  She continues to be on Eliquis and is no longer on aspirin.  I reviewed recent laboratory from her primary  physician.  LDL was increased at 134 and Zetia can be considered but I will defer this to her primary provider.  She will follow-up with [name], NP in 2 months and see me in 4 to 5 months for reevaluation.   Medication Adjustments/Labs and Tests Ordered: Current medicines are reviewed at length with the patient today.  Concerns regarding medicines are outlined above.  Medication changes, Labs and Tests ordered today are listed in the Patient Instructions below. Patient Instructions  Medication Instructions:  INCREASE AMLODIPINE TO 7.5 MG DAILY (1 AND 1/2 TABLET DAILY) INCREASE LOSARTAN TO 75 MG DAILY (1 AND 1/2 TABLETS DAILY) DECREASE ATENOLOL TO 12.5MG DAILY (1/2 TABLET DAILY) *If you need a refill on your cardiac medications before your next appointment, please call your pharmacy*   Follow-Up: At [facility], you and your health needs are our priority.  As part of our continuing mission to provide you with exceptional heart care, we have created designated Provider Care Teams.  These Care Teams include your primary Cardiologist (physician) and Advanced Practice Providers (APPs -  Physician Assistants and Nurse Practitioners) who all work together to provide you  with the care you need, when you need it.  Your next appointment:   2 month(s)  The format for your next appointment:   In Person  Provider:   JESSE CLEAVER   AND FOLLOW UP WITH DR. Shelva Majestic IN 4-5 MONTHS     Signed, Shelva Majestic, MD  12/31/2019 11:25 AM    [facility] [address], [place], [place], [place]  [address] Phone: [phone]

## 2019-12-29 NOTE — Patient Instructions (Signed)
Medication Instructions:  INCREASE AMLODIPINE TO 7.5 MG DAILY (1 AND 1/2 TABLET DAILY) INCREASE LOSARTAN TO 75 MG DAILY (1 AND 1/2 TABLETS DAILY) DECREASE ATENOLOL TO 12.5MG  DAILY (1/2 TABLET DAILY) *If you need a refill on your cardiac medications before your next appointment, please call your pharmacy*   Follow-Up: At [facility], you and your health needs are our priority.  As part of our continuing mission to provide you with exceptional heart care, we have created designated Provider Care Teams.  These Care Teams include your primary Cardiologist (physician) and Advanced Practice Providers (APPs -  Physician Assistants and Nurse Practitioners) who all work together to provide you with the care you need, when you need it.  Your next appointment:   2 month(s)  The format for your next appointment:   In Person  Provider:   [place] 4-5 MONTHS

## 2019-12-31 ENCOUNTER — Encounter: Payer: Self-pay | Admitting: Cardiovascular Disease

## 2020-01-12 ENCOUNTER — Other Ambulatory Visit: Payer: Self-pay | Admitting: Cardiovascular Disease

## 2020-01-12 DIAGNOSIS — I10 Essential (primary) hypertension: Secondary | ICD-10-CM

## 2020-01-12 DIAGNOSIS — R001 Bradycardia, unspecified: Secondary | ICD-10-CM

## 2020-01-12 DIAGNOSIS — I480 Paroxysmal atrial fibrillation: Secondary | ICD-10-CM

## 2020-01-12 DIAGNOSIS — I5032 Chronic diastolic (congestive) heart failure: Secondary | ICD-10-CM

## 2020-01-12 MED ORDER — AMIODARONE HCL 200 MG PO TABS: 200.0000 mg | ORAL_TABLET | Freq: Every day | ORAL | 3 refills | Status: DC

## 2020-01-12 MED ORDER — ATENOLOL 25 MG PO TABS: 12.5000 mg | ORAL_TABLET | Freq: Every day | ORAL | 3 refills | Status: DC

## 2020-01-12 MED ORDER — LOSARTAN POTASSIUM 50 MG PO TABS: 75.0000 mg | ORAL_TABLET | Freq: Every day | ORAL | 3 refills | Status: DC

## 2020-01-12 MED ORDER — AMLODIPINE BESYLATE 5 MG PO TABS: 7.5000 mg | ORAL_TABLET | Freq: Every day | ORAL | 3 refills | Status: DC

## 2020-01-12 NOTE — Telephone Encounter (Signed)
pacerone 200mg  QD refilled - taking once daily per last MD note

## 2020-01-15 ENCOUNTER — Encounter: Payer: Self-pay | Admitting: Internal Medicine

## 2020-01-16 ENCOUNTER — Other Ambulatory Visit: Payer: Self-pay | Admitting: Medical

## 2020-01-17 ENCOUNTER — Other Ambulatory Visit: Payer: Self-pay

## 2020-01-17 MED ORDER — APIXABAN 5 MG PO TABS: 5.0000 mg | ORAL_TABLET | Freq: Two times a day (BID) | ORAL | 1 refills | Status: DC

## 2020-01-18 ENCOUNTER — Other Ambulatory Visit: Payer: Self-pay | Admitting: Internal Medicine

## 2020-02-05 ENCOUNTER — Telehealth: Payer: Self-pay | Admitting: Cardiovascular Disease

## 2020-02-05 NOTE — Telephone Encounter (Signed)
Patient states she is requesting to advise with Dr. Claiborne Billings in regards to a new prescription for a medication for her shakes. Please assist.

## 2020-02-05 NOTE — Telephone Encounter (Signed)
Returned the call to the patient. She stated that she has been having the shakes bad lately and it has made it hard for her to eat. She was prescribed something in the past which helped. The patient thought this was Atenolol. She has been educated on the purpose of Atenolol.  She has been advised to call her PCP to see what recommendations they have for this concern. She has verbalized her understanding.

## 2020-02-06 ENCOUNTER — Ambulatory Visit (INDEPENDENT_AMBULATORY_CARE_PROVIDER_SITE_OTHER): Payer: Medicare Other | Admitting: Internal Medicine

## 2020-02-06 ENCOUNTER — Other Ambulatory Visit: Payer: Self-pay

## 2020-02-06 ENCOUNTER — Encounter: Payer: Self-pay | Admitting: Internal Medicine

## 2020-02-06 VITALS — BP 138/72 | HR 64 | Temp 97.0°F | Resp 16 | Ht 61.0 in | Wt 160.4 lb

## 2020-02-06 DIAGNOSIS — I10 Essential (primary) hypertension: Secondary | ICD-10-CM

## 2020-02-06 DIAGNOSIS — I480 Paroxysmal atrial fibrillation: Secondary | ICD-10-CM | POA: Diagnosis not present

## 2020-02-06 DIAGNOSIS — I5032 Chronic diastolic (congestive) heart failure: Secondary | ICD-10-CM

## 2020-02-06 DIAGNOSIS — G250 Essential tremor: Secondary | ICD-10-CM | POA: Diagnosis not present

## 2020-02-06 MED ORDER — LOSARTAN POTASSIUM 50 MG PO TABS: ORAL_TABLET | ORAL | 0 refills | Status: DC

## 2020-02-06 MED ORDER — NADOLOL 20 MG PO TABS: ORAL_TABLET | ORAL | 3 refills | Status: DC

## 2020-02-06 NOTE — Progress Notes (Signed)
History of Present Illness:       This very nice 86 y.o. Briana [name]  with HTN, pAfib, HLD, T2_DM and Vitamin D Deficiency presents with c/o recent onset tremor of her hands, affecting her handwriting and her ability to eat with a fork or spoon. She relates that her mother in later life developed a "shaking palsy" affecting her head & hands.   Medications  .  amiodarone  200 MG tablet, Take 1 tablet  daily. Marland Kitchen  amLODipine  5 MG tablet, Take 1.5 tablets  daily. Marland Kitchen  atenolol 25 MG tablet, Take 0.5 tablets daily. .  bumetanide  1 MG tablet, Take 1 mg tablet   5 times a week  ( Mon-Tue-Wed-Thurs-Fri ) .  losartan 50 MG tablet, Take 1.5 tablets (75 mg)  daily. Marland Kitchen  albuterol HFA  inhaler, Use 2 inhalations 15-20 minutes apart every 4 hours as needed to rescue Asthma .  Loratadine 10 MG tablet, Take 10 mgdaily as needed for allergies .  Acetaminophen 500 MG tablet, Take 500-1,000 mg  every 6  hours as needed for moderate pain. Marland Kitchen  apixaban (ELIQUIS) 5 MG TABS, Take 1 tablet  2 times daily. Marland Kitchen  VITAMIN C 1000 MG tablet, Take   daily.  Marland Kitchen  VITAMIN D 25 MCG (1000 UT) tab, Take 1,000 Unitsdaily.  . OSTEO BI-FLEX JOINT SHIELD, Take 1 tablet by mouth 2 times daily.  Marland Kitchen PRESERVISION AREDS 2 CAPS, Take 1 capsule by mouth 2  times daily. .  OPCON-A  SOLN, Place 1 drop into both eyes daily as needed (allergies).  Problem list She has Essential hypertension; Hyperlipidemia; Type 2 diabetes mellitus with hyperlipidemia ([place]); Vitamin D deficiency; Diffuse cystic mastopathy; GERD; Osteopenia; Medication management; Lumbar stenosis; Obesity (BMI 30.0-34.9); CKD stage 3 due to type 2 diabetes mellitus ([place]); Unspecified atrial fibrillation ([place]); Bilateral sensorineural hearing loss; Chronic diastolic heart failure ([place]); and Acquired thrombophilia ([place]) on their problem list.   Observations/Objective:  BP 138/72   P 64   T 97 F    R 16   Ht 5\' 1"     Wt 160 lb 6.4 oz   BMI 30.31   Postural         sit BP 143/69     P 51                &              Stand  BP 131/77      P 60  HEENT - WNL. Neck - supple.  Chest - Clear equal BS. Cor - Nl HS. RRR w/o sig MGR. PP 1(+). No edema. MS- FROM w/o deformities.  Gait Nl. Neuro -  Nl w/o focal abnormalities. No head or jaw titubation. High frequency low amplitude fine tremor of the Rt>Lt hand.   Assessment and Plan:  1. Hereditary essential tremor  - nadolol (CORGARD) 20 MG tablet; Take 1 tablet Daily for BP & Tremor  Dispense: 90 tablet; Refill: 3  2. Essential hypertension  - D/C Atenolol  - replace with more centrally acting betablocker with better BBB effect for tremor  with nadolol  20 MG tablet; Take 1 tablet Daily for BP & Tremor  Dispense: 90 tablet; Refill: 3 - Advised monitoring BP - Advised decrease Losartan slightly from 75 to 50 mg to avoid overshoot hypotension with increasing her beta blocker  3. Paroxysmal atrial fibrillation (HCC)  4. Chronic diastolic CHF (congestive heart failure) ([place])  - Has f/u  in 1 month.        I discussed the assessment and treatment plan with the patient. The patient was provided an opportunity to ask questions and all were answered. The patient agreed with the plan and demonstrated an understanding of the instructions.  The patient was advised to call back or seek an in-person evaluation if the symptoms worsen or if the condition fails to improve as anticipated.  Kirtland Bouchard, MD

## 2020-02-06 NOTE — Patient Instructions (Addendum)

## 2020-02-15 ENCOUNTER — Other Ambulatory Visit: Payer: Self-pay | Admitting: Medical

## 2020-02-15 NOTE — Telephone Encounter (Signed)
75f 72.8kg Scr 0.99 11/20/19 Lovw/kelly 12/29/19

## 2020-02-25 NOTE — Progress Notes (Signed)
Cardiology Clinic Note   Patient Name: Briana [name] Date of Encounter: 02/26/2020  Primary Care Provider:  Unk Pinto, MD Primary Cardiologist:  Shelva Majestic, MD  Patient Profile    Briana [name] 86 year old female presents today for follow-up of her hypertension  Past Medical History    Past Medical History:  Diagnosis Date  . Acute on chronic diastolic (congestive) heart failure ([place]) 11/21/2018  . Cancer ([place])    skin cancer  . DDD (degenerative disc disease)   . Diverticula of colon   . Diverticulitis   . DJD (degenerative joint disease)   . GERD (gastroesophageal reflux disease)    takes ranitidine  . Heart murmur   . History of hiatal hernia   . History of pneumonia   . Hx of irritable bowel syndrome   . Hyperlipidemia   . Occasional tremors   . Osteoporosis   . Pre-diabetes   . Varicose veins   . Vitamin D deficiency    Past Surgical History:  Procedure Laterality Date  . APPENDECTOMY    . BREAST SURGERY Right    fluid drained from breast  . CARDIOVERSION N/A 12/20/2018   Procedure: CARDIOVERSION;  Surgeon: Buford Dresser, MD;  Location: [facility]COPY;  Service: Cardiovascular;  Laterality: N/A;  . CARDIOVERSION N/A 06/16/2019   Procedure: CARDIOVERSION;  Surgeon: Fay Records, MD;  Location: [place];  Service: Cardiovascular;  Laterality: N/A;  . COLONOSCOPY    . ESOPHAGOGASTRODUODENOSCOPY    . JOINT REPLACEMENT Left    left hip  . LUMBAR LAMINECTOMY/DECOMPRESSION MICRODISCECTOMY N/A 06/05/2015   Procedure: L3-L5 DECOMPRESSION ;  Surgeon: Melina Schools, MD;  Location: [place];  Service: Orthopedics;  Laterality: N/A;  . OPEN REDUCTION INTERNAL FIXATION (ORIF) DISTAL RADIAL FRACTURE Right 01/10/2014   Procedure: OPEN REDUCTION INTERNAL FIXATION (ORIF) DISTAL RADIAL FRACTURE;  Surgeon: Linna Hoff, MD;  Location: [place];  Service: Orthopedics;  Laterality: Right;  . SPINAL CORD DECOMPRESSION  06/05/2015   L3 L 5  . THYROID SURGERY    .  TONSILLECTOMY    . TUBAL LIGATION    . varicose veins stripped      Allergies  Allergies  Allergen Reactions  . Accupril [Quinapril Hcl] Cough  . Neosporin [Neomycin-Bacitracin Zn-Polymyx]     unknown  . Prednisone     Agitated and shaky  . Reglan [Metoclopramide] Other (See Comments)    tremors  . Tramadol Nausea And Vomiting  . Adhesive [Tape] Rash    History of Present Illness    Ms. Briana [name] has a past medical history of essential hypertension, chronic diastolic heart failure, GERD, bilateral sensorineural hearing loss,CKD stage III,hyperlipidemia,vitamin D deficiency,obesity,atrial fibrillation diagnosed January 2020 and lumbar stenosis.  She underwent cardioversion on 12/20/2018 and 06/16/2019. With her second cardioversion she received 1 shock with 200 J. She became bradycardic and her atenolol was stopped. On 06/2019 she was found to be hypertensive and her atenolol was restarted. An echocardiogram on 11/22/2018 showed an EF of 60 to 65%. Her initial atrial fibrillation was believed to be brought on by a bout of acute congestive heart failure she was diuresed and discharged home on Bumex, placed on Eliquis for 3 days and underwent her first cardioversion which was on 12/20/2018.  She was last seen by me on 09/12/2019. During that time she was hypertensive with blood pressures in the 099I and 338S systolic.Her  amlodipine 5 mg was changed to BID.She was encouraged to follow a low sodium diet.  She presents  to the clinic today and states her blood pressure has been better managed at home with systolic pressures in the 130's and 140's .She feels well and continues to walk regularly at her local church 3 or more times per week.  She states she has been trying to follow her low-sodium diet but occasionally uses "No Salt".    She was last seen by Dr. Claiborne Billings on 12/29/2019.  During that time she was having labile hypertension with SBP ranging from 140-170.  She denied PND  orthopnea, and bleeding.  Her atenolol was reduced to 12.5 mg, amiodarone was continued 200 mg, losartan was increased to 75, and amlodipine 7.5 mg.  She was continued on her Eliquis but no longer on aspirin due to no history of CAD.  She presents the clinic today for follow-up and states she has felt somewhat sluggish since starting the nadolol.  Her heart rate today is 46.  According to her records it has been in the high to mid 40s since starting the medication.  She continues to try to walk 3 or more days per week and is eating a low-sodium diet.  I will decrease her nadolol to 10 mg and increase her losartan to 75 mg.  I will have her follow-up in 1 month and have a BMP drawn at that time.  Today shedenies chest pain, shortness of breath, lower extremity edema, palpitations, melena, hematuria, hemoptysis, diaphoresis, weakness, presyncope, syncope, orthopnea, and PND.  Home Medications    Prior to Admission medications   Medication Sig Start Date End Date Taking? Authorizing Provider  acetaminophen (TYLENOL) 500 MG tablet Take 500-1,000 mg by mouth every 6 (six) hours as needed for moderate pain.    [provider]  albuterol (PROVENTIL HFA;VENTOLIN HFA) 108 (90 Base) MCG/ACT inhaler Use 2 inhalations 15-20 minutes apart every 4 hours as needed to rescue Asthma Patient taking differently: Inhale 2 puffs into the lungs every 4 (four) hours as needed for wheezing.  12/09/18   Unk Pinto, MD  amiodarone (PACERONE) 200 MG tablet Take 1 tablet (200 mg total) by mouth daily. 01/12/20   Troy Sine, MD  amLODipine (NORVASC) 5 MG tablet Take 1.5 tablets (7.5 mg total) by mouth daily. 01/12/20 05/11/20  Troy Sine, MD  Ascorbic Acid (VITAMIN C) 1000 MG tablet Take 1,000 mg by mouth daily.     [provider]  bumetanide (BUMEX) 1 MG tablet Take 1 mg tablet   5 times a week  ( Mon-Tue-Wed-Thurs-Fri ) 10/17/19   Troy Sine, MD  cholecalciferol (VITAMIN D3) 25 MCG (1000  UT) tablet Take 1,000 Units by mouth daily.     [provider]  ELIQUIS 5 MG TABS tablet Take 1 tablet by mouth twice daily 02/15/20   Troy Sine, MD  loratadine (CLARITIN) 10 MG tablet Take 10 mg by mouth daily as needed for allergies.    [provider]  losartan (COZAAR) 50 MG tablet Take 1 tablet Daily for BP & Heart 02/06/20   Unk Pinto, MD  Misc Natural Products (OSTEO BI-FLEX JOINT SHIELD PO) Take 1 tablet by mouth 2 (two) times daily.     [provider]  Multiple Vitamins-Minerals (PRESERVISION AREDS 2) CAPS Take 1 capsule by mouth 2 (two) times daily.    [provider]  nadolol (CORGARD) 20 MG tablet Take 1 tablet Daily for BP & Tremor 02/06/20   Unk Pinto, MD  Naphazoline-Pheniramine (OPCON-A) 0.027-0.315 % SOLN Place 1 drop into both  eyes daily as needed (allergies).    [provider]    Family History    Family History  Problem Relation Age of Onset  . Hypertension Mother   . Hyperlipidemia Mother   . Heart disease Sister   . Cancer Brother        prostate   She indicated that her mother is deceased. She indicated that her father is deceased. She indicated that her sister is deceased. She indicated that her brother is alive.  Social History    Social History   Socioeconomic History  . Marital status: Widowed    Spouse name: Not on file  . Number of children: Not on file  . Years of education: Not on file  . Highest education level: Not on file  Occupational History  . Not on file  Tobacco Use  . Smoking status: Never Smoker  . Smokeless tobacco: Never Used  Substance and Sexual Activity  . Alcohol use: No  . Drug use: No  . Sexual activity: Not on file  Other Topics Concern  . Not on file  Social History Narrative  . Not on file   Social Determinants of Health   Financial Resource Strain:   . Difficulty of Paying Living Expenses:   Food Insecurity:   . Worried About Charity fundraiser in  the Last Year:   . Arboriculturist in the Last Year:   Transportation Needs:   . Film/video editor (Medical):   Marland Kitchen Lack of Transportation (Non-Medical):   Physical Activity:   . Days of Exercise per Week:   . Minutes of Exercise per Session:   Stress:   . Feeling of Stress :   Social Connections:   . Frequency of Communication with Friends and Family:   . Frequency of Social Gatherings with Friends and Family:   . Attends Religious Services:   . Active Member of Clubs or Organizations:   . Attends Archivist Meetings:   Marland Kitchen Marital Status:   Intimate Partner Violence:   . Fear of Current or Ex-Partner:   . Emotionally Abused:   Marland Kitchen Physically Abused:   . Sexually Abused:      Review of Systems    General:  No chills, fever, night sweats or weight changes.  Cardiovascular:  No chest pain, dyspnea on exertion, edema, orthopnea, palpitations, paroxysmal nocturnal dyspnea. Dermatological: No rash, lesions/masses Respiratory: No cough, dyspnea Urologic: No hematuria, dysuria Abdominal:   No nausea, vomiting, diarrhea, bright red blood per rectum, melena, or hematemesis Neurologic:  No visual changes, wkns, changes in mental status. All other systems reviewed and are otherwise negative except as noted above.  Physical Exam    VS:  BP (!) 150/82   Pulse 64   Ht 5' 5.5" (1.664 m)   Wt 161 lb 3.2 oz (73.1 kg)   SpO2 96%   BMI 26.42 kg/m  , BMI Body mass index is 26.42 kg/m. GEN: Well nourished, well developed, in no acute distress. HEENT: normal. Neck: Supple, no JVD, carotid bruits, or masses. Cardiac: RRR, no murmurs, rubs, or gallops. No clubbing, cyanosis, edema.  Radials/DP/PT 2+ and equal bilaterally.  Respiratory:  Respirations regular and unlabored, clear to auscultation bilaterally. GI: Soft, nontender, nondistended, BS + x 4. MS: no deformity or atrophy. Skin: warm and dry, no rash. Neuro:  Strength and sensation are intact. Psych: Normal  affect.  Accessory Clinical Findings    ECG personally reviewed by me today-none today.  EKG 12/29/2019 Sinus bradycardia left axis deviation incomplete right bundle branch block anterior infarct undetermined age 47 bpm  EKG 09/28/2019 Sinus bradycardia left axis deviation incomplete right bundle branch block 52 bpm  Echocardiogram 11/22/2018: Study Conclusions  - Procedure narrative: Transthoracic echocardiography. Image quality was adequate. The study was technically difficult. - Left ventricle: The cavity size was normal. Wall thickness was increased in a pattern of mild LVH. Systolic function was normal. The estimated ejection fraction was in the range of 60% to 65%. Wall motion was normal; there were no regional wall motion abnormalities. The study was not technically sufficient to allow evaluation of LV diastolic dysfunction due to atrial fibrillation. - Aortic valve: There was trivial regurgitation directed eccentrically in the LVOT and towards the mitral anterior leaflet. - Left atrium: The atrium was mildly dilated. - Pulmonic valve: There was trivial regurgitation. - Pericardium, extracardiac: A trivial, free-flowing pericardial effusion was identified circumferential to the heart.  Assessment & Plan   1. Essential hypertension-blood pressure today150/82.  Similar 300P 233A systolic at home.  Heart rate today 46. Increase nadolol to 10 mg daily Increase losartan 75 mg daily Heart healthy low-sodium diet-educated about no salt substitute. Maintain physical activity  2. Persistent atrial fibrillation-successfulcardioversionon 06/16/2019 with 1 shock at 200 J.   Heart rate today 46 bpm.   Continue amiodarone 200 mg tablet daily Continue apixaban 5 mg tablet twice daily  Decrease nadolol10 mg daily ContinueBumex 1 tablet every other day Monday, Wednesday, Friday, Saturday-instructed to call office with 3 pounds overnight or 5 pounds in 1  week weight gain. Maintain physical activity as tolerated Low-sodium heart healthy diet CHA2DS2/VAS Stroke Risk Points6 high risk (chf,htn,age, diabetes, female) Instructed to avoid caffeinated beverages and chocolate  3. Chronic diastolic congestive heart failure-echocardiogram 0/05/6225 diastolic dysfunction due to atrial fibrillationLVEF 60 to 65%. Nolower extremityedema today.Weight today 159 pounds down from 156 pounds on 09/12/2019. ContinuetoBumex 1 tablet every other day Monday, Wednesday, Friday, Saturday-instructed to call office with 3 pounds overnight or 5 pounds in 1 week weight gain.  Heart healthy low-sodium diet Maintain physical activity  Disposition: Follow-up with me in 1 month.  Jossie Ng. [facility] [address] Office [phone] Fax [phone]

## 2020-02-26 ENCOUNTER — Other Ambulatory Visit: Payer: Self-pay

## 2020-02-26 ENCOUNTER — Ambulatory Visit: Payer: Medicare Other | Admitting: General Practice

## 2020-02-26 ENCOUNTER — Encounter: Payer: Self-pay | Admitting: General Practice

## 2020-02-26 VITALS — BP 150/82 | HR 64 | Ht 65.5 in | Wt 161.2 lb

## 2020-02-26 DIAGNOSIS — I480 Paroxysmal atrial fibrillation: Secondary | ICD-10-CM

## 2020-02-26 DIAGNOSIS — I5032 Chronic diastolic (congestive) heart failure: Secondary | ICD-10-CM

## 2020-02-26 DIAGNOSIS — I4819 Other persistent atrial fibrillation: Secondary | ICD-10-CM | POA: Diagnosis not present

## 2020-02-26 DIAGNOSIS — Z79899 Other long term (current) drug therapy: Secondary | ICD-10-CM

## 2020-02-26 DIAGNOSIS — I10 Essential (primary) hypertension: Secondary | ICD-10-CM

## 2020-02-26 MED ORDER — LOSARTAN POTASSIUM 50 MG PO TABS: 75.0000 mg | ORAL_TABLET | Freq: Every day | ORAL | 3 refills | Status: DC

## 2020-02-26 MED ORDER — NADOLOL 20 MG PO TABS: 10.0000 mg | ORAL_TABLET | Freq: Every day | ORAL | 3 refills | Status: DC

## 2020-02-26 NOTE — Patient Instructions (Addendum)
Medication Instructions:  INCREASE LOSARTAN 75MG  DAILY (1-1/2 TAB)  DECREASE NADOLOL 10MG  DAILY (1/2 TAB)  *If you need a refill on your cardiac medications before your next appointment, please call your pharmacy*  Lab Work: BMET-3 DAYS BEFORE FOLLOW UP APPOINTMENTHERE IN OUR OFFICE AT [place]  THIS IS NOT FASTING If you have labs (blood work) drawn today and your tests are completely normal, you will receive your results only by:  [place] (if you have MyChart) OR A paper copy in the mail.  If you have any lab test that is abnormal or we need to change your treatment, we will call you to review the results.  Special Instructions MAINTAIN PHYSICAL ACTIVITY  PLEASE READ AND FOLLOW SALTY 6-ATTACHED  Follow-Up: Your next appointment:  1 month(s) In Person with Shelva Majestic, MD or JESSE CLEAVER NP-C 03-27-2020 AT 1145AM, PLEASE ARRIVE EARLY WEARING A MASK  At [facility], you and your health needs are our priority.  As part of our continuing mission to provide you with exceptional heart care, we have created designated Provider Care Teams.  These Care Teams include your primary Cardiologist (physician) and Advanced Practice Providers (APPs -  Physician Assistants and Nurse Practitioners) who all work together to provide you with the care you need, when you need it.  We recommend signing up for the patient portal called "MyChart".  Sign up information is provided on this After Visit Summary.  MyChart is used to connect with patients for Virtual Visits (Telemedicine).  Patients are able to view lab/test results, encounter notes, upcoming appointments, etc.  Non-urgent messages can be sent to your provider as well.   To learn more about what you can do with MyChart, go to [email or web address].

## 2020-02-28 ENCOUNTER — Ambulatory Visit (INDEPENDENT_AMBULATORY_CARE_PROVIDER_SITE_OTHER): Payer: Medicare Other | Admitting: Internal Medicine

## 2020-02-28 ENCOUNTER — Other Ambulatory Visit: Payer: Self-pay

## 2020-02-28 VITALS — BP 140/80 | HR 52 | Temp 97.2°F | Resp 16 | Ht 61.0 in | Wt 160.1 lb

## 2020-02-28 DIAGNOSIS — Z8249 Family history of ischemic heart disease and other diseases of the circulatory system: Secondary | ICD-10-CM

## 2020-02-28 DIAGNOSIS — E1121 Type 2 diabetes mellitus with diabetic nephropathy: Secondary | ICD-10-CM | POA: Diagnosis not present

## 2020-02-28 DIAGNOSIS — D6869 Other thrombophilia: Secondary | ICD-10-CM

## 2020-02-28 DIAGNOSIS — I480 Paroxysmal atrial fibrillation: Secondary | ICD-10-CM

## 2020-02-28 DIAGNOSIS — Z136 Encounter for screening for cardiovascular disorders: Secondary | ICD-10-CM

## 2020-02-28 DIAGNOSIS — Z79899 Other long term (current) drug therapy: Secondary | ICD-10-CM

## 2020-02-28 DIAGNOSIS — I10 Essential (primary) hypertension: Secondary | ICD-10-CM | POA: Diagnosis not present

## 2020-02-28 DIAGNOSIS — G250 Essential tremor: Secondary | ICD-10-CM

## 2020-02-28 DIAGNOSIS — E1122 Type 2 diabetes mellitus with diabetic chronic kidney disease: Secondary | ICD-10-CM

## 2020-02-28 DIAGNOSIS — Z0000 Encounter for general adult medical examination without abnormal findings: Secondary | ICD-10-CM | POA: Diagnosis not present

## 2020-02-28 DIAGNOSIS — N1831 Chronic kidney disease, stage 3a: Secondary | ICD-10-CM | POA: Diagnosis not present

## 2020-02-28 DIAGNOSIS — Z0001 Encounter for general adult medical examination with abnormal findings: Secondary | ICD-10-CM

## 2020-02-28 DIAGNOSIS — E559 Vitamin D deficiency, unspecified: Secondary | ICD-10-CM

## 2020-02-28 DIAGNOSIS — E1169 Type 2 diabetes mellitus with other specified complication: Secondary | ICD-10-CM | POA: Diagnosis not present

## 2020-02-28 DIAGNOSIS — I5032 Chronic diastolic (congestive) heart failure: Secondary | ICD-10-CM

## 2020-02-28 DIAGNOSIS — E785 Hyperlipidemia, unspecified: Secondary | ICD-10-CM

## 2020-02-28 DIAGNOSIS — Z1211 Encounter for screening for malignant neoplasm of colon: Secondary | ICD-10-CM

## 2020-02-28 MED ORDER — OLMESARTAN MEDOXOMIL 40 MG PO TABS: ORAL_TABLET | ORAL | 3 refills | Status: DC

## 2020-02-28 NOTE — Progress Notes (Signed)
Annual Screening/Preventative Visit & Comprehensive Evaluation &  Examination     This very nice 86 y.o. WWF presents for a Screening /Preventative Visit & comprehensive evaluation and management of multiple medical co-morbidities.  Patient has been followed for HTN, ASHD/pAfib, HLD, T2_NIDDM  W/CKD 3a and Vitamin D Deficiency.      HTN predates since     . Patient's BP has been controlled at home and patient denies any cardiac symptoms as chest pain, palpitations, shortness of breath, dizziness or ankle swelling. Today's BP was initially elevated & rechecked at goal -  140/80       Patient's hyperlipidemia is controlled with diet and medications. Patient denies myalgias or other medication SE's. Last lipids were not at goal:  Lab Results  Component Value Date   CHOL 202 (H) 02/28/2020   HDL 67 02/28/2020   LDLCALC 119 (H) 02/28/2020   TRIG 67 02/28/2020   CHOLHDL 3.0 02/28/2020       Patient has hx/o Obesity (BMI 30+) and consequent  T2_NIDDM (A1c 6.6% / 2011)  which she's attempting mgm't by diet  and patient denies reactive hypoglycemic symptoms, visual blurring, diabetic polys or paresthesias. Last A1c was not at goal:  Lab Results  Component Value Date   HGBA1C 6.3 (H) 02/28/2020       Finally, patient has history of Vitamin D Deficiency (23" / 2008)  and last Vitamin D was still low:   Lab Results  Component Value Date   VD25OH 38 02/28/2020    Current Outpatient Medications on File Prior to Visit  Medication Sig  . acetaminophen (TYLENOL) 500 MG tablet Take 500-1,000 mg by mouth every 6 (six) hours as needed for moderate pain.  Marland Kitchen albuterol (PROVENTIL HFA;VENTOLIN HFA) 108 (90 Base) MCG/ACT inhaler Use 2 inhalations 15-20 minutes apart every 4 hours as needed to rescue Asthma (Patient taking differently: Inhale 2 puffs into the lungs every 4 (four) hours as needed for wheezing. )  . amiodarone (PACERONE) 200 MG tablet Take 1 tablet (200 mg total) by mouth daily.  Marland Kitchen  amLODipine (NORVASC) 5 MG tablet Take 1.5 tablets (7.5 mg total) by mouth daily.  . Ascorbic Acid (VITAMIN C) 1000 MG tablet Take 1,000 mg by mouth daily.   . bumetanide (BUMEX) 1 MG tablet Take 1 mg tablet   5 times a week  ( Mon-Tue-Wed-Thurs-Fri )  . cholecalciferol (VITAMIN D3) 25 MCG (1000 UT) tablet Take 1,000 Units by mouth daily.   Marland Kitchen ELIQUIS 5 MG TABS tablet Take 1 tablet by mouth twice daily  . loratadine (CLARITIN) 10 MG tablet Take 10 mg by mouth daily as needed for allergies.  . Misc Natural Products (OSTEO BI-FLEX JOINT SHIELD PO) Take 1 tablet by mouth 2 (two) times daily.   . Multiple Vitamins-Minerals (PRESERVISION AREDS 2) CAPS Take 1 capsule by mouth 2 (two) times daily.  . nadolol (CORGARD) 20 MG tablet Take 0.5 tablets (10 mg total) by mouth daily. Take 1 tablet Daily for BP & Tremor  . Naphazoline-Pheniramine (OPCON-A) 0.027-0.315 % SOLN Place 1 drop into both eyes daily as needed (allergies).   No current facility-administered medications on file prior to visit.   Allergies  Allergen Reactions  . Accupril [Quinapril Hcl] Cough  . Neosporin [Neomycin-Bacitracin Zn-Polymyx]     unknown  . Prednisone     Agitated and shaky  . Reglan [Metoclopramide] Other (See Comments)    tremors  . Tramadol Nausea And Vomiting  . Adhesive [Tape] Rash   Past  Medical History:  Diagnosis Date  . Acute on chronic diastolic (congestive) heart failure ([place]) 11/21/2018  . Cancer ([place])    skin cancer  . DDD (degenerative disc disease)   . Diverticula of colon   . Diverticulitis   . DJD (degenerative joint disease)   . GERD (gastroesophageal reflux disease)    takes ranitidine  . Heart murmur   . History of hiatal hernia   . History of pneumonia   . Hx of irritable bowel syndrome   . Hyperlipidemia   . Occasional tremors   . Osteoporosis   . Pre-diabetes   . Varicose veins   . Vitamin D deficiency    Health Maintenance  Topic Date Due  . COVID-19 Vaccine (1) Never done  .  OPHTHALMOLOGY EXAM  11/02/2019  . INFLUENZA VACCINE  06/16/2020  . HEMOGLOBIN A1C  08/29/2020  . FOOT EXAM  02/27/2021  . TETANUS/TDAP  09/04/2025  . DEXA SCAN  Completed  . PNA vac Low Risk Adult  Completed   Immunization History  Administered Date(s) Administered  . DT (Pediatric) 09/05/2015  . DTaP 11/16/2004  . Influenza Whole 08/30/2013  . Influenza, High Dose Seasonal PF 08/28/2015, 08/10/2016, 11/20/2019  . Pneumococcal Conjugate-13 10/19/2016  . Pneumococcal Polysaccharide-23 11/16/2001  . Zoster 06/01/2013   Last Colon - 01/21/2010 - Dr Fuller Plan - recc 10 yr f/u Mar 2022 - Aged out  Last MGM - 01/25/2019 - overdue  Past Surgical History:  Procedure Laterality Date  . APPENDECTOMY    . BREAST EXCISIONAL BIOPSY Right   . BREAST SURGERY Right    fluid drained from breast  . CARDIOVERSION N/A 12/20/2018   Procedure: CARDIOVERSION;  Surgeon: Buford Dresser, MD;  Location: [facility] ENDOSCOPY;  Service: Cardiovascular;  Laterality: N/A;  . CARDIOVERSION N/A 06/16/2019   Procedure: CARDIOVERSION;  Surgeon: Fay Records, MD;  Location: [place];  Service: Cardiovascular;  Laterality: N/A;  . COLONOSCOPY    . ESOPHAGOGASTRODUODENOSCOPY    . JOINT REPLACEMENT Left    left hip  . LUMBAR LAMINECTOMY/DECOMPRESSION MICRODISCECTOMY N/A 06/05/2015   Procedure: L3-L5 DECOMPRESSION ;  Surgeon: Melina Schools, MD;  Location: [place];  Service: Orthopedics;  Laterality: N/A;  . OPEN REDUCTION INTERNAL FIXATION (ORIF) DISTAL RADIAL FRACTURE Right 01/10/2014   Procedure: OPEN REDUCTION INTERNAL FIXATION (ORIF) DISTAL RADIAL FRACTURE;  Surgeon: Linna Hoff, MD;  Location: [place];  Service: Orthopedics;  Laterality: Right;  . SPINAL CORD DECOMPRESSION  06/05/2015   L3 L 5  . THYROID SURGERY    . TONSILLECTOMY    . TUBAL LIGATION    . varicose veins stripped     Family History  Problem Relation Age of Onset  . Hypertension Mother   . Hyperlipidemia Mother   . Heart disease Sister   .  Cancer Brother        prostate   Social History   Tobacco Use  . Smoking status: Never Smoker  . Smokeless tobacco: Never Used  Substance Use Topics  . Alcohol use: No  . Drug use: No    ROS Constitutional: Denies fever, chills, weight loss/gain, headaches, insomnia,  night sweats, and change in appetite. Does c/o fatigue. Eyes: Denies redness, blurred vision, diplopia, discharge, itchy, watery eyes.  ENT: Denies discharge, congestion, post nasal drip, epistaxis, sore throat, earache, hearing loss, dental pain, Tinnitus, Vertigo, Sinus pain, snoring.  Cardio: Denies chest pain, palpitations, irregular heartbeat, syncope, dyspnea, diaphoresis, orthopnea, PND, claudication, edema Respiratory: denies cough, dyspnea, DOE, pleurisy, hoarseness, laryngitis, wheezing.  Gastrointestinal: Denies dysphagia, heartburn, reflux, water brash, pain, cramps, nausea, vomiting, bloating, diarrhea, constipation, hematemesis, melena, hematochezia, jaundice, hemorrhoids Genitourinary: Denies dysuria, frequency, urgency, nocturia, hesitancy, discharge, hematuria, flank pain Breast: Breast lumps, nipple discharge, bleeding.  Musculoskeletal: Denies arthralgia, myalgia, stiffness, Jt. Swelling, pain, limp, and strain/sprain. Denies falls. Skin: Denies puritis, rash, hives, warts, acne, eczema, changing in skin lesion Neuro: No weakness, tremor, incoordination, spasms, paresthesia, pain Psychiatric: Denies confusion, memory loss, sensory loss. Denies Depression. Endocrine: Denies change in weight, skin, hair change, nocturia, and paresthesia, diabetic polys, visual blurring, hyper / hypo glycemic episodes.  Heme/Lymph: No excessive bleeding, bruising, enlarged lymph nodes.  Physical Exam  BP 140/80   Pulse (!) 52   Temp (!) 97.2 F (36.2 C)   Resp 16   Ht 5\' 1"  (1.549 m)   Wt 160 lb 1.6 oz (72.6 kg)   BMI 30.25 kg/m   General Appearance: Well nourished, well groomed and in no apparent  distress.  Eyes: PERRLA, EOMs, conjunctiva no swelling or erythema, normal fundi and vessels. Sinuses: No frontal/maxillary tenderness ENT/Mouth: EACs patent / TMs  nl. Nares clear without erythema, swelling, mucoid exudates. Oral hygiene is good. No erythema, swelling, or exudate. Tongue normal, non-obstructing. Tonsils not swollen or erythematous. Hearing normal.  Neck: Supple, thyroid not palpable. No bruits, nodes or JVD. Respiratory: Respiratory effort normal.  BS equal and clear bilateral without rales, rhonci, wheezing or stridor. Cardio: Heart sounds are normal with regular rate and rhythm and no murmurs, rubs or gallops. Peripheral pulses are normal and equal bilaterally without edema. No aortic or femoral bruits. Chest: symmetric with normal excursions and percussion. Breasts: Symmetric, without lumps, nipple discharge, retractions, or fibrocystic changes.  Abdomen: Flat, soft with bowel sounds active. Nontender, no guarding, rebound, hernias, masses, or organomegaly.  Lymphatics: Non tender without lymphadenopathy.  Musculoskeletal: Full ROM all peripheral extremities, joint stability, 5/5 strength, and normal gait. Skin: Warm and dry without rashes, lesions, cyanosis, clubbing or  ecchymosis.  Neuro: Cranial nerves intact, reflexes equal bilaterally. Normal muscle tone, no cerebellar symptoms. Sensation intact.  Pysch: Alert and oriented X 3, normal affect, Insight and Judgment appropriate.   Assessment and Plan  1. Annual Preventative Screening Examination  2. Essential hypertension  - EKG 12-Lead - Urinalysis, Routine w reflex microscopic - Microalbumin / creatinine urine ratio - COMPLETE METABOLIC PANEL WITH GFR - TSH  3. Hyperlipidemia associated with type 2 diabetes mellitus ([place])  - EKG 12-Lead - Lipid panel - TSH  4. Type 2 diabetes mellitus with stage 3a chronic kidney disease,  without long-term current use of insulin (H[facility])  - EKG 12-Lead - HM DIABETES FOOT  EXAM - LOW EXTREMITY NEUR EXAM DOCUM - Hemoglobin A1c - Insulin, random  5. Vitamin D deficiency  - VITAMIN D 25 Hydroxy   6. Chronic diastolic CHF (congestive heart failure) (H[facility])  - EKG 12-Lead - TSH  7. Paroxysmal atrial fibrillation ([facility])  - EKG 12-Lead  8. Acquired thrombophilia ([place])  9. Screening for colorectal cancer  - POC Hemoccult Bld/Stl   10. Screening for ischemic heart disease  - EKG 12-Lead  11. Hereditary essential tremor   12. FHx: heart disease  - EKG 12-Lead  13. Medication management  - Urinalysis, Routine w reflex microscopic - Microalbumin / creatinine urine ratio - LOW EXTREMITY NEUR EXAM DOCUM - CBC with Differential/Platelet - COMPLETE METABOLIC PANEL WITH GFR - Magnesium - TSH - Hemoglobin A1c - Insulin, random - VITAMIN D 25 Hydroxy  Patient was counseled in prudent diet to achieve/maintain BMI less than 25 for weight control, BP monitoring, regular exercise and medications. Discussed med's effects and SE's. Screening labs and tests as requested with regular follow-up as recommended. Over 40 minutes of exam, counseling, chart review and high complex critical decision making was performed.   Kirtland Bouchard, MD

## 2020-02-28 NOTE — Patient Instructions (Signed)

## 2020-02-29 ENCOUNTER — Ambulatory Visit
Admission: RE | Admit: 2020-02-29 | Discharge: 2020-02-29 | Disposition: A | Discharge status: Home / Self Care | Payer: Medicare Other | Source: Ambulatory Visit | Attending: Internal Medicine | Admitting: Internal Medicine

## 2020-02-29 ENCOUNTER — Other Ambulatory Visit: Payer: Self-pay | Admitting: Internal Medicine

## 2020-02-29 DIAGNOSIS — Z1231 Encounter for screening mammogram for malignant neoplasm of breast: Secondary | ICD-10-CM | POA: Diagnosis not present

## 2020-02-29 LAB — CBC WITH DIFFERENTIAL/PLATELET
Absolute Monocytes: 734 cells/uL (ref 200–950)
Basophils Absolute: 32 cells/uL (ref 0–200)
Basophils Relative: 0.6 %
Eosinophils Absolute: 97 cells/uL (ref 15–500)
Eosinophils Relative: 1.8 %
HCT: 40.5 % (ref 35.0–45.0)
Hemoglobin: 13.2 g/dL (ref 11.7–15.5)
Lymphs Abs: 853 cells/uL (ref 850–3900)
MCH: 30.3 pg (ref 27.0–33.0)
MCHC: 32.6 g/dL (ref 32.0–36.0)
MCV: 93.1 fL (ref 80.0–100.0)
MPV: 11.5 fL (ref 7.5–12.5)
Monocytes Relative: 13.6 %
Neutro Abs: 3683 cells/uL (ref 1500–7800)
Neutrophils Relative %: 68.2 %
Platelets: 197 10*3/uL (ref 140–400)
RBC: 4.35 10*6/uL (ref 3.80–5.10)
RDW: 13.3 % (ref 11.0–15.0)
Total Lymphocyte: 15.8 %
WBC: 5.4 10*3/uL (ref 3.8–10.8)

## 2020-02-29 LAB — URINALYSIS, ROUTINE W REFLEX MICROSCOPIC
Bacteria, UA: NONE SEEN /HPF
Bilirubin Urine: NEGATIVE
Glucose, UA: NEGATIVE
Hyaline Cast: NONE SEEN /LPF
Ketones, ur: NEGATIVE
Nitrite: NEGATIVE
Protein, ur: NEGATIVE
Specific Gravity, Urine: 1.012 (ref 1.001–1.03)
Squamous Epithelial / HPF: NONE SEEN /HPF (ref <=5)
pH: 6.5 (ref 5.0–8.0)

## 2020-02-29 LAB — COMPLETE METABOLIC PANEL WITH GFR
AG Ratio: 1.9 (calc) (ref 1.0–2.5)
ALT: 15 U/L (ref 6–29)
AST: 18 U/L (ref 10–35)
Albumin: 4.1 g/dL (ref 3.6–5.1)
Alkaline phosphatase (APISO): 85 U/L (ref 37–153)
BUN/Creatinine Ratio: 15 (calc) (ref 6–22)
BUN: 21 mg/dL (ref 7–25)
CO2: 28 mmol/L (ref 20–32)
Calcium: 9.6 mg/dL (ref 8.6–10.4)
Chloride: 104 mmol/L (ref 98–110)
Creat: 1.38 mg/dL — ABNORMAL HIGH (ref 0.60–0.88)
GFR, Est African American: 41 mL/min/{1.73_m2} — ABNORMAL LOW (ref >=60)
GFR, Est Non African American: 35 mL/min/{1.73_m2} — ABNORMAL LOW (ref >=60)
Globulin: 2.2 g/dL (calc) (ref 1.9–3.7)
Glucose, Bld: 131 mg/dL — ABNORMAL HIGH (ref 65–99)
Potassium: 4.3 mmol/L (ref 3.5–5.3)
Sodium: 141 mmol/L (ref 135–146)
Total Bilirubin: 0.6 mg/dL (ref 0.2–1.2)
Total Protein: 6.3 g/dL (ref 6.1–8.1)

## 2020-02-29 LAB — LIPID PANEL
Cholesterol: 202 mg/dL — ABNORMAL HIGH (ref <200)
HDL: 67 mg/dL (ref >=50)
LDL Cholesterol (Calc): 119 mg/dL (calc) — ABNORMAL HIGH
Non-HDL Cholesterol (Calc): 135 mg/dL (calc) — ABNORMAL HIGH (ref <130)
Total CHOL/HDL Ratio: 3 (calc) (ref <5.0)
Triglycerides: 67 mg/dL (ref <150)

## 2020-02-29 LAB — TSH: TSH: 1.13 mIU/L (ref 0.40–4.50)

## 2020-02-29 LAB — HEMOGLOBIN A1C
Hgb A1c MFr Bld: 6.3 % of total Hgb — ABNORMAL HIGH (ref <5.7)
Mean Plasma Glucose: 134 (calc)
eAG (mmol/L): 7.4 (calc)

## 2020-02-29 LAB — VITAMIN D 25 HYDROXY (VIT D DEFICIENCY, FRACTURES): Vit D, 25-Hydroxy: 38 ng/mL (ref 30–100)

## 2020-02-29 LAB — MICROALBUMIN / CREATININE URINE RATIO
Creatinine, Urine: 54 mg/dL (ref 20–275)
Microalb Creat Ratio: 37 mcg/mg creat — ABNORMAL HIGH (ref <30)
Microalb, Ur: 2 mg/dL

## 2020-02-29 LAB — INSULIN, RANDOM: Insulin: 6.6 u[IU]/mL

## 2020-02-29 LAB — MAGNESIUM: Magnesium: 2.3 mg/dL (ref 1.5–2.5)

## 2020-03-03 ENCOUNTER — Encounter: Payer: Self-pay | Admitting: Internal Medicine

## 2020-03-11 IMAGING — CR DG CHEST 2V
2 series · 2 of 2 positions shown · non-contrast
Comparison: 06/25/2016

CLINICAL DATA: Shortness of breath and cough over the last several
days.

EXAM:
CHEST - 2 VIEW

[chest pa]
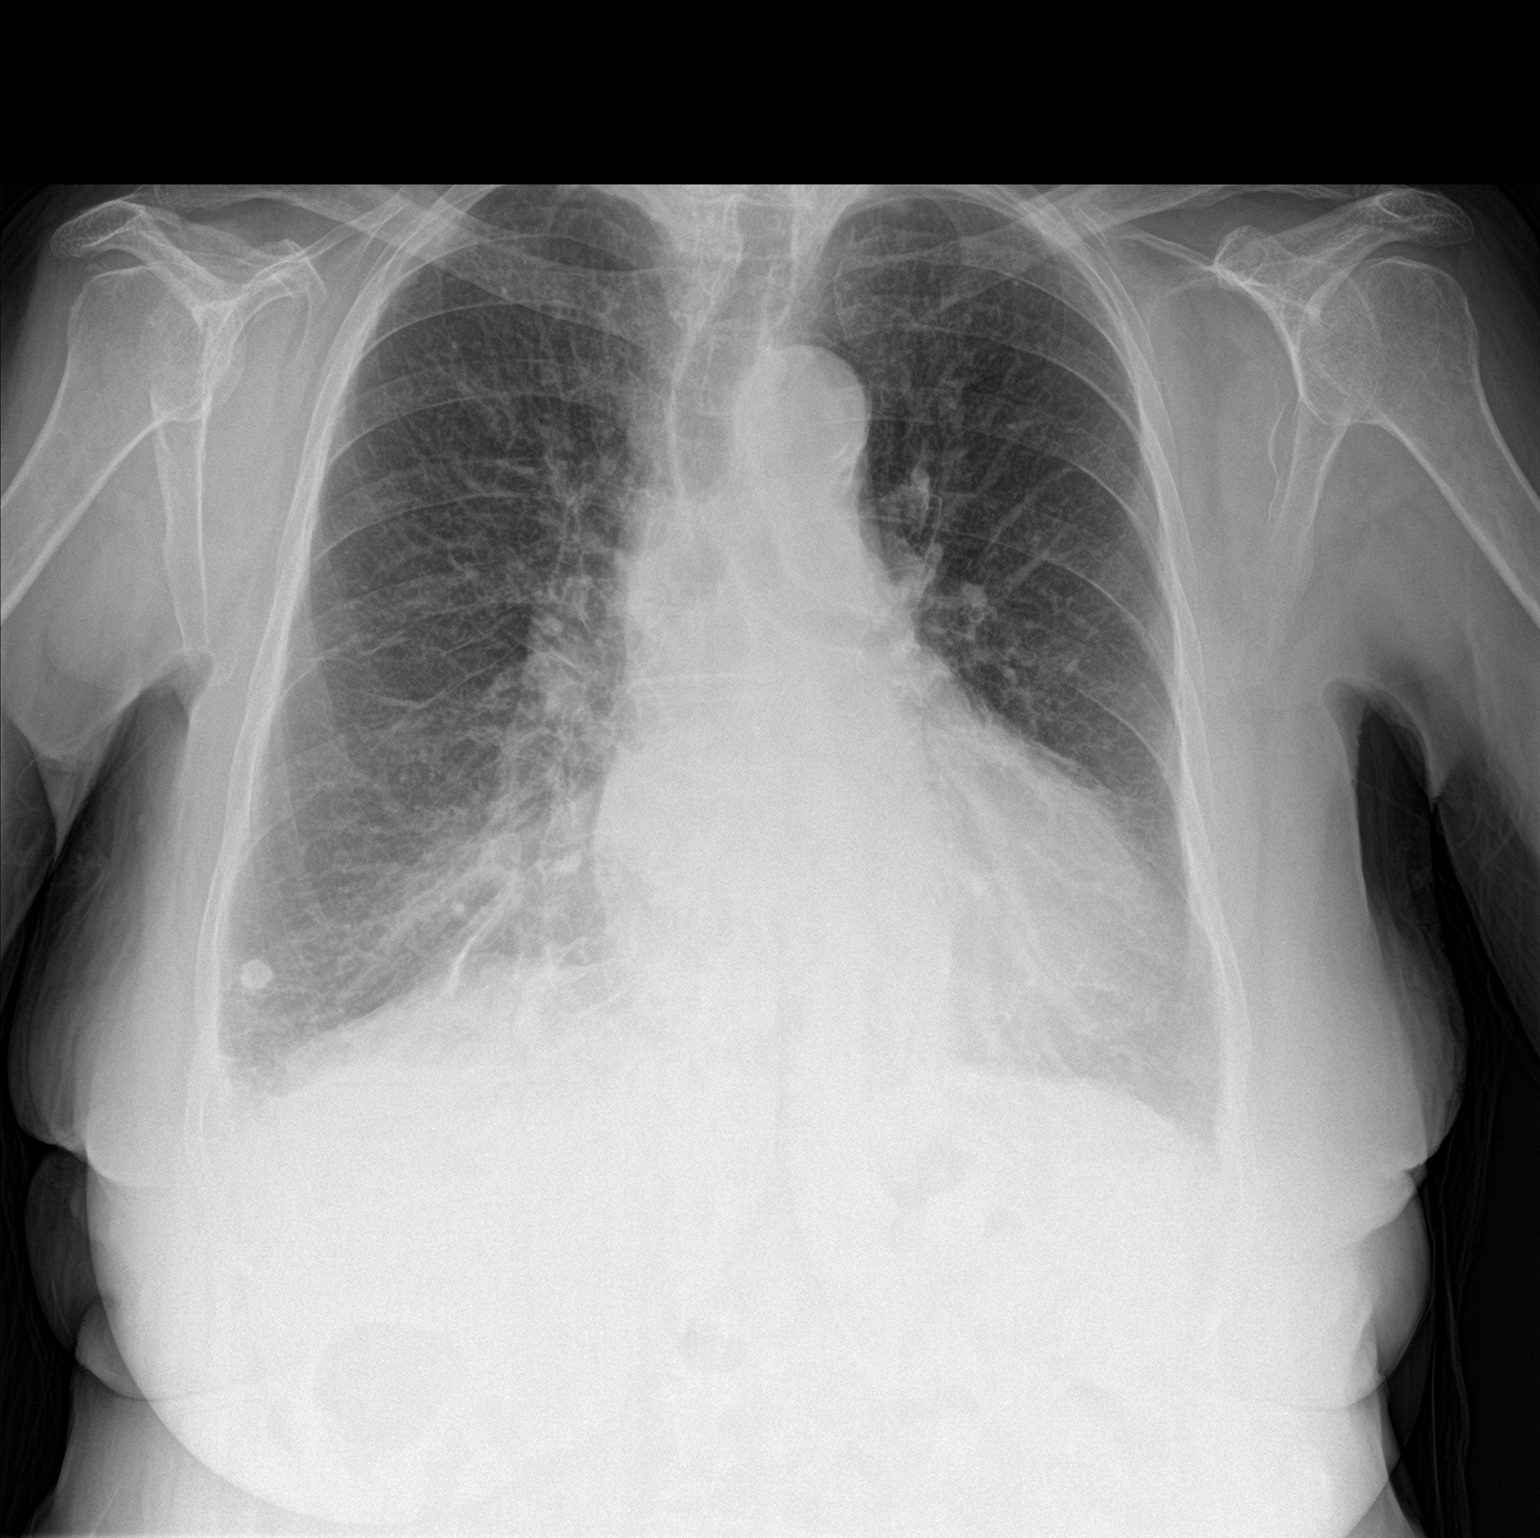

[chest lat]
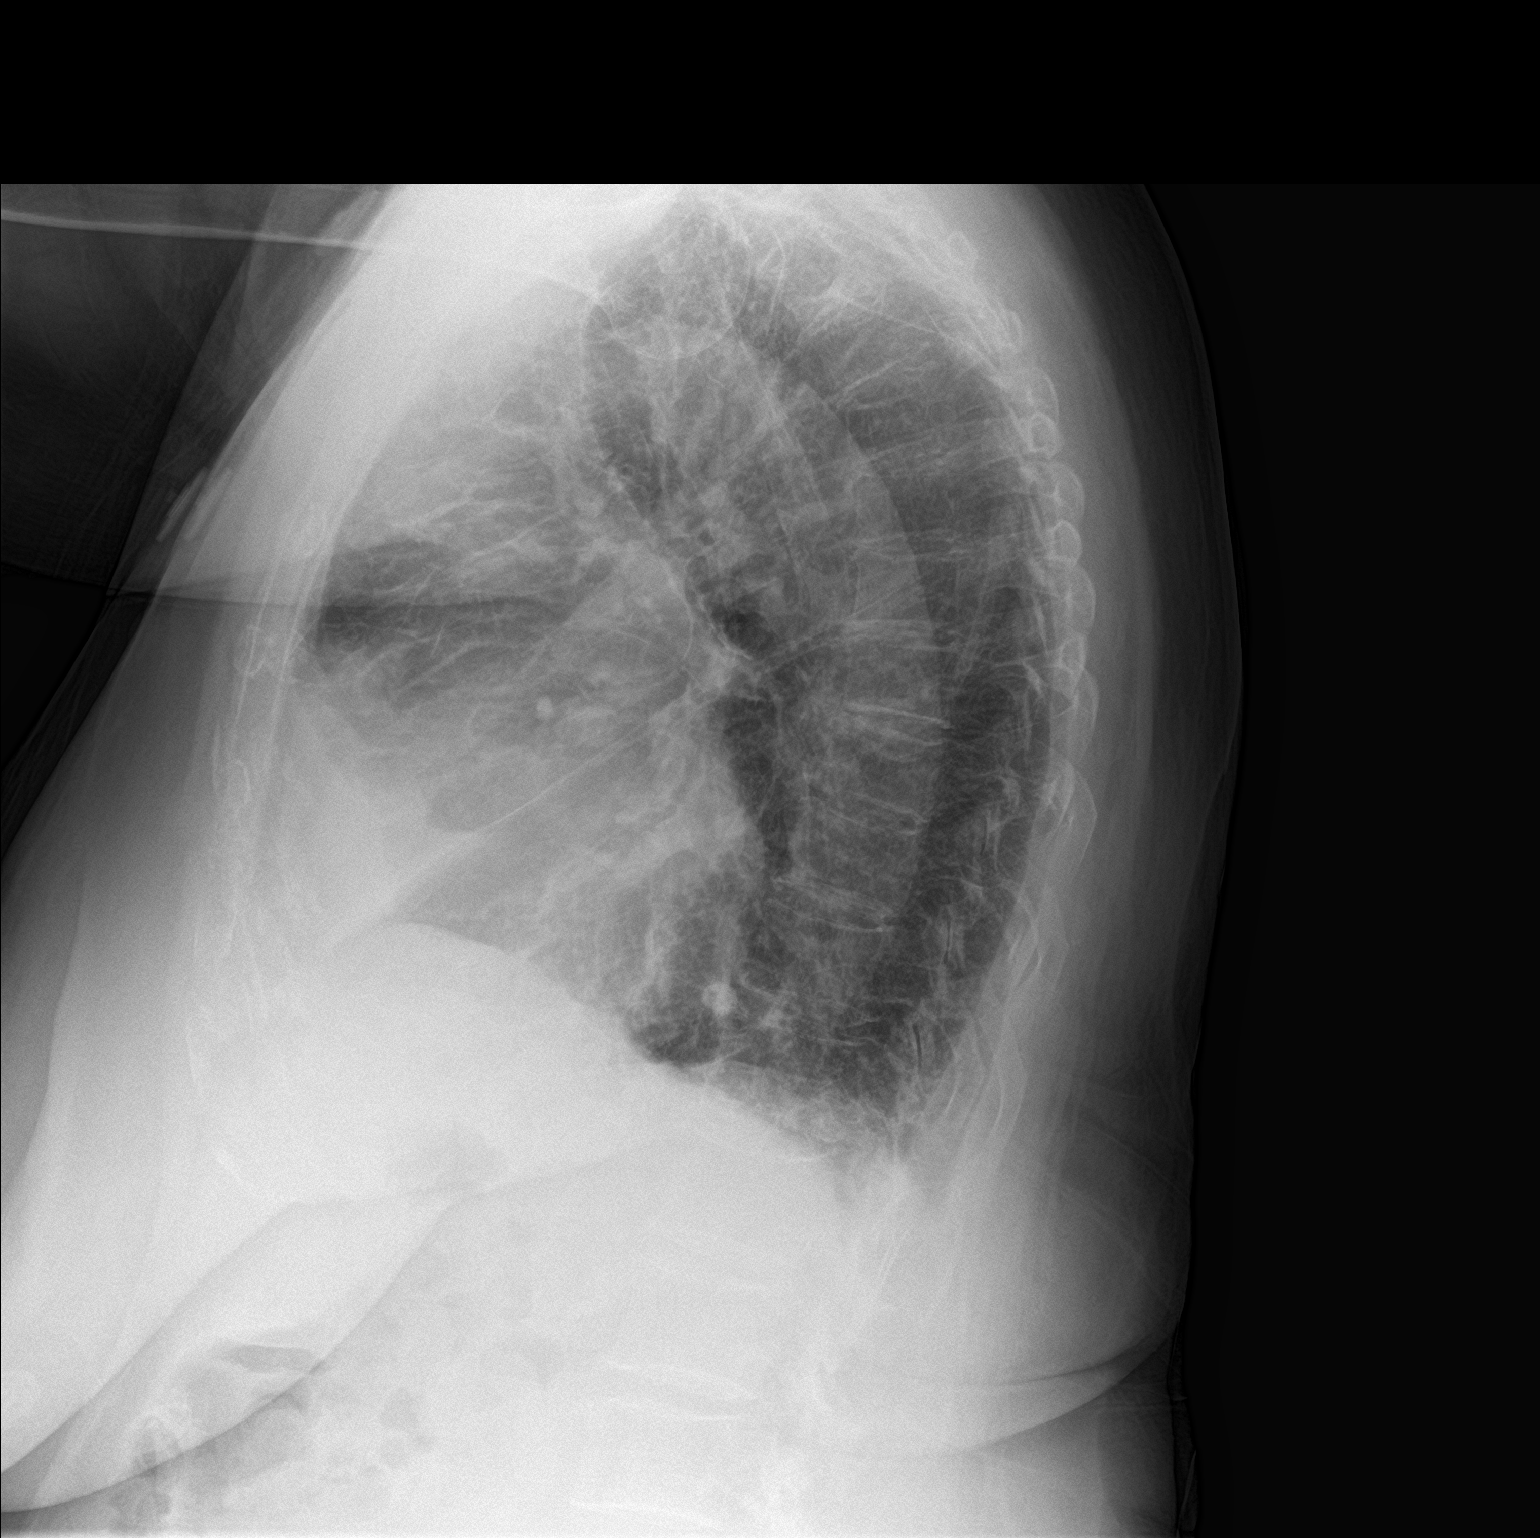

[2 of 2 positions shown; findings below may reference images not displayed]

FINDINGS: The heart is enlarged. There is aortic atherosclerosis. There is
pulmonary venous hypertension with mild interstitial edema. There
small pleural effusions. There is mild basilar atelectasis.
Calcified granuloma again seen in the right lower lung. Findings are
most consistent with congestive heart failure. Minor basilar
pneumonia not excluded. No acute bone finding.
IMPRESSION: Probable congestive heart failure with interstitial edema, small
effusions and basilar atelectasis. Basilar pneumonia not excluded.

## 2020-03-25 DIAGNOSIS — I480 Paroxysmal atrial fibrillation: Secondary | ICD-10-CM | POA: Diagnosis not present

## 2020-03-25 DIAGNOSIS — I10 Essential (primary) hypertension: Secondary | ICD-10-CM | POA: Diagnosis not present

## 2020-03-25 DIAGNOSIS — G250 Essential tremor: Secondary | ICD-10-CM | POA: Diagnosis not present

## 2020-03-25 DIAGNOSIS — I4819 Other persistent atrial fibrillation: Secondary | ICD-10-CM | POA: Diagnosis not present

## 2020-03-25 DIAGNOSIS — I5032 Chronic diastolic (congestive) heart failure: Secondary | ICD-10-CM | POA: Diagnosis not present

## 2020-03-26 LAB — BASIC METABOLIC PANEL
BUN/Creatinine Ratio: 20 (ref 12–28)
BUN: 20 mg/dL (ref 8–27)
CO2: 26 mmol/L (ref 20–29)
Calcium: 9.8 mg/dL (ref 8.7–10.3)
Chloride: 105 mmol/L (ref 96–106)
Creatinine, Ser: 0.99 mg/dL (ref 0.57–1.00)
GFR calc Af Amer: 61 mL/min/{1.73_m2} (ref >59)
GFR calc non Af Amer: 52 mL/min/{1.73_m2} — ABNORMAL LOW (ref >59)
Glucose: 107 mg/dL — ABNORMAL HIGH (ref 65–99)
Potassium: 4.7 mmol/L (ref 3.5–5.2)
Sodium: 143 mmol/L (ref 134–144)

## 2020-03-26 NOTE — Progress Notes (Signed)
Cardiology Clinic Note   Patient Name: Briana [name] Date of Encounter: 03/27/2020  Primary Care Provider:  Unk Pinto, MD Primary Cardiologist:  Shelva Majestic, MD  Patient Profile    Briana [name] 86 year old female presents today for follow-up of her hypertension  Past Medical History    Past Medical History:  Diagnosis Date  . Acute on chronic diastolic (congestive) heart failure ([place]) 11/21/2018  . Cancer ([place])    skin cancer  . DDD (degenerative disc disease)   . Diverticula of colon   . Diverticulitis   . DJD (degenerative joint disease)   . GERD (gastroesophageal reflux disease)    takes ranitidine  . Heart murmur   . History of hiatal hernia   . History of pneumonia   . Hx of irritable bowel syndrome   . Hyperlipidemia   . Occasional tremors   . Osteoporosis   . Pre-diabetes   . Varicose veins   . Vitamin D deficiency    Past Surgical History:  Procedure Laterality Date  . APPENDECTOMY    . BREAST EXCISIONAL BIOPSY Right   . BREAST SURGERY Right    fluid drained from breast  . CARDIOVERSION N/A 12/20/2018   Procedure: CARDIOVERSION;  Surgeon: Buford Dresser, MD;  Location: [facility] ENDOSCOPY;  Service: Cardiovascular;  Laterality: N/A;  . CARDIOVERSION N/A 06/16/2019   Procedure: CARDIOVERSION;  Surgeon: Fay Records, MD;  Location: [place];  Service: Cardiovascular;  Laterality: N/A;  . COLONOSCOPY    . ESOPHAGOGASTRODUODENOSCOPY    . JOINT REPLACEMENT Left    left hip  . LUMBAR LAMINECTOMY/DECOMPRESSION MICRODISCECTOMY N/A 06/05/2015   Procedure: L3-L5 DECOMPRESSION ;  Surgeon: Melina Schools, MD;  Location: [place];  Service: Orthopedics;  Laterality: N/A;  . OPEN REDUCTION INTERNAL FIXATION (ORIF) DISTAL RADIAL FRACTURE Right 01/10/2014   Procedure: OPEN REDUCTION INTERNAL FIXATION (ORIF) DISTAL RADIAL FRACTURE;  Surgeon: Linna Hoff, MD;  Location: [place];  Service: Orthopedics;  Laterality: Right;  . SPINAL CORD DECOMPRESSION  06/05/2015   L3 L 5  . THYROID SURGERY    . TONSILLECTOMY    . TUBAL LIGATION    . varicose veins stripped      Allergies  Allergies  Allergen Reactions  . Accupril [Quinapril Hcl] Cough  . Neosporin [Neomycin-Bacitracin Zn-Polymyx]     unknown  . Prednisone     Agitated and shaky  . Reglan [Metoclopramide] Other (See Comments)    tremors  . Tramadol Nausea And Vomiting  . Adhesive [Tape] Rash    History of Present Illness    Briana [name] has a past medical history of essential hypertension, chronic diastolic heart failure, GERD, bilateral sensorineural hearing loss,CKD stage III,hyperlipidemia,vitamin D deficiency,obesity,atrial fibrillation diagnosed January 2020 and lumbar stenosis.  She underwent cardioversion on 12/20/2018 and 06/16/2019. With her second cardioversion she received 1 shock with 200 J. She became bradycardic and her atenolol was stopped. On 06/2019 she was found to be hypertensive and her atenolol was restarted. An echocardiogram on 11/22/2018 showed an EF of 60 to 65%. Her initial atrial fibrillation was believed to be brought on by a bout of acute congestive heart failure she was diuresed and discharged home on Bumex, placed on Eliquis for 3 days and underwent her first cardioversion which was on 12/20/2018.  She was last seen by me on 09/12/2019. During that time she was hypertensive with blood pressures in the 562B and 150ssystolic.Her amlodipine 5 mg was changed to BID.She was encouraged to follow a low sodium  diet.  She presents to the clinic today and states her blood pressure has been better managed at homewithsystolic pressures in the 130's and 140's.She feels well and continues to walk regularly at her local church3 or more times per week.She states she has been trying to follow her low-sodium diet but occasionally uses "No Salt".  She was last seen by Dr. Claiborne Billings on 12/29/2019.  During that time she was having labile hypertension with SBP ranging from  140-170.  She denied PND orthopnea, and bleeding.  Her atenolol was reduced to 12.5 mg, amiodarone was continued 200 mg, losartan was increased to 75, and amlodipine 7.5 mg.  She was continued on her Eliquis but no longer on aspirin due to no history of CAD.  She presented to the clinic 02/26/20 for follow-up and stated she had felt somewhat sluggish since starting the nadolol.  Her heart rate was 46.  According to her records it has been in the high to mid 40s since starting the medication.  She continued to try to walk 3 or more days per week and was eating a low-sodium diet.  I  decreased her nadolol to 10 mg and increase her losartan to 75 mg.  I planned follow-up for 1 month and  a BMP drawn at that time.  She presents to the clinic today for follow-up evaluation and states she continues to walk regularly at the track by her house.  She states that she used to like to run and would like to know if she still may be able to do light running occasionally.  She states that she occasionally uses salt substitute on her legs.  After increasing her losartan her PCP discontinued the medication and starting her on olmesartan 40 mg daily.  Her blood pressure has been fairly well controlled at home.  Her BMP shows her creatinine is back to baseline.  I will continue her current medication regimen, give her salty 6 sheet and have her follow-up with Dr. Claiborne Billings in 3 to 4 months.  Today shedenies chest pain, shortness of breath, lower extremity edema, palpitations, melena, hematuria, hemoptysis, diaphoresis, weakness, presyncope, syncope, orthopnea, and PND.   Home Medications    Prior to Admission medications   Medication Sig Start Date End Date Taking? Authorizing Provider  acetaminophen (TYLENOL) 500 MG tablet Take 500-1,000 mg by mouth every 6 (six) hours as needed for moderate pain.    [provider]  albuterol (PROVENTIL HFA;VENTOLIN HFA) 108 (90 Base) MCG/ACT inhaler Use 2 inhalations 15-20  minutes apart every 4 hours as needed to rescue Asthma Patient taking differently: Inhale 2 puffs into the lungs every 4 (four) hours as needed for wheezing.  12/09/18   Unk Pinto, MD  amiodarone (PACERONE) 200 MG tablet Take 1 tablet (200 mg total) by mouth daily. 01/12/20   Troy Sine, MD  amLODipine (NORVASC) 5 MG tablet Take 1.5 tablets (7.5 mg total) by mouth daily. 01/12/20 05/11/20  Troy Sine, MD  Ascorbic Acid (VITAMIN C) 1000 MG tablet Take 1,000 mg by mouth daily.     [provider]  bumetanide (BUMEX) 1 MG tablet Take 1 mg tablet   5 times a week  ( Mon-Tue-Wed-Thurs-Fri ) 10/17/19   Troy Sine, MD  cholecalciferol (VITAMIN D3) 25 MCG (1000 UT) tablet Take 1,000 Units by mouth daily.     [provider]  ELIQUIS 5 MG TABS tablet Take 1 tablet by mouth twice daily 02/15/20   Troy Sine, MD  loratadine (CLARITIN) 10 MG tablet Take 10 mg by mouth daily as needed for allergies.    [provider]  Misc Natural Products (OSTEO BI-FLEX JOINT SHIELD PO) Take 1 tablet by mouth 2 (two) times daily.     [provider]  Multiple Vitamins-Minerals (PRESERVISION AREDS 2) CAPS Take 1 capsule by mouth 2 (two) times daily.    [provider]  nadolol (CORGARD) 20 MG tablet Take 0.5 tablets (10 mg total) by mouth daily. Take 1 tablet Daily for BP & Tremor 02/26/20   Deberah Pelton, NP  Naphazoline-Pheniramine (OPCON-A) 0.027-0.315 % SOLN Place 1 drop into both eyes daily as needed (allergies).    [provider]  olmesartan (BENICAR) 40 MG tablet Take 1 tablet Daily for BP 02/28/20   Unk Pinto, MD    Family History    Family History  Problem Relation Age of Onset  . Hypertension Mother   . Hyperlipidemia Mother   . Heart disease Sister   . Cancer Brother        prostate   She indicated that her mother is deceased. She indicated that her father is deceased. She indicated that her sister is deceased. She indicated  that her brother is alive.  Social History    Social History   Socioeconomic History  . Marital status: Widowed    Spouse name: Not on file  . Number of children: Not on file  . Years of education: Not on file  . Highest education level: Not on file  Occupational History  . Not on file  Tobacco Use  . Smoking status: Never Smoker  . Smokeless tobacco: Never Used  Substance and Sexual Activity  . Alcohol use: No  . Drug use: No  . Sexual activity: Not on file  Other Topics Concern  . Not on file  Social History Narrative  . Not on file   Social Determinants of Health   Financial Resource Strain:   . Difficulty of Paying Living Expenses:   Food Insecurity:   . Worried About Charity fundraiser in the Last Year:   . Arboriculturist in the Last Year:   Transportation Needs:   . Film/video editor (Medical):   Marland Kitchen Lack of Transportation (Non-Medical):   Physical Activity:   . Days of Exercise per Week:   . Minutes of Exercise per Session:   Stress:   . Feeling of Stress :   Social Connections:   . Frequency of Communication with Friends and Family:   . Frequency of Social Gatherings with Friends and Family:   . Attends Religious Services:   . Active Member of Clubs or Organizations:   . Attends Archivist Meetings:   Marland Kitchen Marital Status:   Intimate Partner Violence:   . Fear of Current or Ex-Partner:   . Emotionally Abused:   Marland Kitchen Physically Abused:   . Sexually Abused:      Review of Systems    General:  No chills, fever, night sweats or weight changes.  Cardiovascular:  No chest pain, dyspnea on exertion, edema, orthopnea, palpitations, paroxysmal nocturnal dyspnea. Dermatological: No rash, lesions/masses Respiratory: No cough, dyspnea Urologic: No hematuria, dysuria Abdominal:   No nausea, vomiting, diarrhea, bright red blood per rectum, melena, or hematemesis Neurologic:  No visual changes, wkns, changes in mental status. All other systems reviewed  and are otherwise negative except as noted above.  Physical Exam    VS:  BP 138/72   Pulse Marland Kitchen)  47   Ht 5\' 1"  (1.549 m)   Wt 161 lb 12.8 oz (73.4 kg)   SpO2 97%   BMI 30.57 kg/m  , BMI Body mass index is 30.57 kg/m. GEN: Well nourished, well developed, in no acute distress. HEENT: normal. Neck: Supple, no JVD, carotid bruits, or masses. Cardiac: RRR, no murmurs, rubs, or gallops. No clubbing, cyanosis, edema.  Radials/DP/PT 2+ and equal bilaterally.  Respiratory:  Respirations regular and unlabored, clear to auscultation bilaterally. GI: Soft, nontender, nondistended, BS + x 4. MS: no deformity or atrophy. Skin: warm and dry, no rash. Neuro:  Strength and sensation are intact. Psych: Normal affect.  Accessory Clinical Findings    ECG personally reviewed by me today-none today.  EKG 12/29/2019 Sinus bradycardia left axis deviation incomplete right bundle branch block anterior infarct undetermined age 65 bpm  EKG 09/28/2019 Sinus bradycardia left axis deviation incomplete right bundle branch block 52 bpm  Echocardiogram 11/22/2018: Study Conclusions  - Procedure narrative: Transthoracic echocardiography. Image quality was adequate. The study was technically difficult. - Left ventricle: The cavity size was normal. Wall thickness was increased in a pattern of mild LVH. Systolic function was normal. The estimated ejection fraction was in the range of 60% to 65%. Wall motion was normal; there were no regional wall motion abnormalities. The study was not technically sufficient to allow evaluation of LV diastolic dysfunction due to atrial fibrillation. - Aortic valve: There was trivial regurgitation directed eccentrically in the LVOT and towards the mitral anterior leaflet. - Left atrium: The atrium was mildly dilated. - Pulmonic valve: There was trivial regurgitation. - Pericardium, extracardiac: A trivial, free-flowing pericardial effusion was  identified circumferential to the heart.  Assessment & Plan   1.  Essential hypertension-blood pressure [id number]. Blood pressure 161W 960A systolic at home.  Heart rate today 47. Continue nadolol to 10 mg daily Continue Olmesartan 40 mg daily  Heart healthy low-sodium diet-educated about no salt substitute. Maintain physical activity  2. Persistent atrial fibrillation-successfulcardioversionon 06/16/2019 with 1 shock at 200 J.  Heart rate today 47 bpm.   Continue amiodarone 200 mg tablet daily Continue apixaban 5 mg tablet twice daily  Continue nadolol10 mg daily ContinueBumex 1 tablet every other day Monday, Wednesday, Friday, Saturday-instructed to call office with 3 pounds overnight or 5 pounds in 1 week weight gain. Maintain physical activity as tolerated Low-sodium heart healthy diet CHA2DS2/VAS Stroke Risk Points6 high risk (chf,htn,age, diabetes, female) Instructed to avoid caffeinated beverages and chocolate  3. Chronic diastolic congestive heart failure-euvolemic today.  No increased work of breathing. Echocardiogram 03/19/980 diastolic dysfunction due to atrial fibrillationLVEF 60 to 65%. Nolower extremityedema today.Weight today 161pounds up from 156poundson 09/12/2019. ContinuetoBumex 1 tablet every other day Monday, Wednesday, Friday, Saturday-instructed to call office with 3 pounds overnight or 5 pounds in 1 week weight gain.  Heart healthy low-sodium diet Maintain physical activity  Disposition: Follow-up with Dr. Claiborne Billings in 3-4 months.  Jossie Ng. Aulden Calise NP-C    03/27/2020, 12:29 PM [place] [address] Office [phone] Fax [phone]

## 2020-03-27 ENCOUNTER — Other Ambulatory Visit: Payer: Self-pay

## 2020-03-27 ENCOUNTER — Encounter: Payer: Self-pay | Admitting: General Practice

## 2020-03-27 ENCOUNTER — Ambulatory Visit: Payer: Medicare Other | Admitting: General Practice

## 2020-03-27 VITALS — BP 138/72 | HR 47 | Ht 61.0 in | Wt 161.8 lb

## 2020-03-27 DIAGNOSIS — I5032 Chronic diastolic (congestive) heart failure: Secondary | ICD-10-CM

## 2020-03-27 DIAGNOSIS — I4819 Other persistent atrial fibrillation: Secondary | ICD-10-CM

## 2020-03-27 DIAGNOSIS — I10 Essential (primary) hypertension: Secondary | ICD-10-CM

## 2020-03-27 NOTE — Patient Instructions (Signed)
Medication Instructions:  The current medical regimen is effective;  continue present plan and medications as directed. Please refer to the Current Medication list given to you today. *If you need a refill on your cardiac medications before your next appointment, please call your pharmacy*  Special Instructions PLEASE READ AND FOLLOW SALTY 6-ATTACHED  Follow-Up: Your next appointment:  3-4 month(s) In Person with Shelva Majestic, MD  At [facility], you and your health needs are our priority.  As part of our continuing mission to provide you with exceptional heart care, we have created designated Provider Care Teams.  These Care Teams include your primary Cardiologist (physician) and Advanced Practice Providers (APPs -  Physician Assistants and Nurse Practitioners) who all work together to provide you with the care you need, when you need it.  We recommend signing up for the patient portal called "MyChart".  Sign up information is provided on this After Visit Summary.  MyChart is used to connect with patients for Virtual Visits (Telemedicine).  Patients are able to view lab/test results, encounter notes, upcoming appointments, etc.  Non-urgent messages can be sent to your provider as well.   To learn more about what you can do with MyChart, go to [email or web address].

## 2020-04-04 ENCOUNTER — Other Ambulatory Visit: Payer: Self-pay | Admitting: *Deleted

## 2020-04-04 DIAGNOSIS — Z1211 Encounter for screening for malignant neoplasm of colon: Secondary | ICD-10-CM

## 2020-04-04 DIAGNOSIS — Z1212 Encounter for screening for malignant neoplasm of rectum: Secondary | ICD-10-CM | POA: Diagnosis not present

## 2020-04-04 LAB — POC HEMOCCULT BLD/STL (HOME/3-CARD/SCREEN)
Card #2 Fecal Occult Blod, POC: NEGATIVE
Card #3 Fecal Occult Blood, POC: NEGATIVE
Fecal Occult Blood, POC: NEGATIVE

## 2020-04-24 ENCOUNTER — Other Ambulatory Visit: Payer: Self-pay | Admitting: Internal Medicine

## 2020-04-29 ENCOUNTER — Ambulatory Visit (INDEPENDENT_AMBULATORY_CARE_PROVIDER_SITE_OTHER): Payer: Medicare Other | Admitting: Adult Health

## 2020-04-29 ENCOUNTER — Encounter: Payer: Self-pay | Admitting: Adult Health

## 2020-04-29 ENCOUNTER — Other Ambulatory Visit: Payer: Self-pay

## 2020-04-29 VITALS — BP 134/68 | HR 52 | Temp 97.7°F | Ht 61.0 in | Wt 161.4 lb

## 2020-04-29 DIAGNOSIS — R05 Cough: Secondary | ICD-10-CM | POA: Diagnosis not present

## 2020-04-29 DIAGNOSIS — R058 Other specified cough: Secondary | ICD-10-CM

## 2020-04-29 DIAGNOSIS — J069 Acute upper respiratory infection, unspecified: Secondary | ICD-10-CM | POA: Diagnosis not present

## 2020-04-29 MED ORDER — AMOXICILLIN-POT CLAVULANATE 875-125 MG PO TABS: 1.0000 | ORAL_TABLET | Freq: Two times a day (BID) | ORAL | 0 refills | Status: AC

## 2020-04-29 MED ORDER — PROMETHAZINE-DM 6.25-15 MG/5ML PO SYRP
5.0000 mL | ORAL_SOLUTION | Freq: Four times a day (QID) | ORAL | 1 refills | Status: DC | PRN
Start: 2020-04-29 — End: 2020-11-13

## 2020-04-29 NOTE — Progress Notes (Signed)
Assessment and Plan:  Stewart was seen today for cough and allergies.  Diagnoses and all orders for this visit:  Productive cough URI with cough and congestion Day 5 of cough, progressive and increasingly productive Has not had covid 19 infection or vaccine; strongly encouraged she present for drive through testing VS and exam is relatively benign at this time; however with progressive sx, age, co morbidities will proceed with abx  Too late today for CXR at imaging center, but will strongly consider if not notably improved in 24-48 hours, Gave breztri inhaler sample to try 1 puff BID for bronchitic sounding cough, rinse mouth after Spoke with daughter on phone who was unable to accompany her today who will monitor and assist with scheduling covid 19 testing and follow up if needed Go to the ER if any chest pain, shortness of breath, nausea, dizziness, severe HA, changes vision/speech -     promethazine-dextromethorphan (PROMETHAZINE-DM) 6.25-15 MG/5ML syrup; Take 5 mLs by mouth 4 (four) times daily as needed for cough. -     amoxicillin-clavulanate (AUGMENTIN) 875-125 MG tablet; Take 1 tablet by mouth 2 (two) times daily for 7 days.  Further disposition pending results of labs. Discussed med's effects and SE's.   Over 30 minutes of exam, counseling, chart review, and critical decision making was performed.   Future Appointments  Date Time Provider [place]  05/29/2020  3:30 PM [name], NP [facility] None  06/25/2020  2:00 PM Troy Sine, MD [facility]  08/29/2020  3:45 PM Vicie Mutters, PA-C [facility] None  03/05/2021  3:00 PM Unk Pinto, MD [facility] None    ------------------------------------------------------------------------------------------------------------------   HPI BP 134/68   Pulse (!) 52   Temp 97.7 F (36.5 C)   Ht 5\' 1"  (1.549 m)   Wt 161 lb 6.4 oz (73.2 kg)   SpO2 97%   BMI 30.50 kg/m   86 y.o.female never smoker with hx  of seasonal allergies, GERD presents for new URI sx.   She reports 5 days ago she noted gradual onset of mild cough (initially non-productive), productive of thick brown mucus that began yesterday and today. She states feels like chest cough, gets irritated in chest and will start coughing. She endorses runny nose (not new, typical with allergy season). Denies nasal congestion, sinus pressure, sore throat. Denies CP, dyspnea, wheezing, fever/chills. She denies HA other than with cough. Denies loss of smell or taste. Denies edema, PND.   She has taken mucinex which has seemed to help break up mucus, has taken tylenol which did help her feel somewhat better. She takes Claritin for allergies with good results. She has PRN albuterol which she has tried a few days without notable benefit.   She has not had covid 19 infection or vaccine, no known sick contacts, has seen friends/family but none with known covid 19 cases or otherwise ill.   She has a history of Diastolic CHF Denies dyspnea on exertion, orthopnea, paroxysmal nocturnal dyspnea and edema. Positive for none. Wt Readings from Last 3 Encounters:  04/29/20 161 lb 6.4 oz (73.2 kg)  03/27/20 161 lb 12.8 oz (73.4 kg)  02/28/20 160 lb 1.6 oz (72.6 kg)     Past Medical History:  Diagnosis Date  . Acute on chronic diastolic (congestive) heart failure ([place]) 11/21/2018  . Cancer ([place])    skin cancer  . DDD (degenerative disc disease)   . Diverticula of colon   . Diverticulitis   . DJD (degenerative joint disease)   .  GERD (gastroesophageal reflux disease)    takes ranitidine  . Heart murmur   . History of hiatal hernia   . History of pneumonia   . Hx of irritable bowel syndrome   . Hyperlipidemia   . Occasional tremors   . Osteoporosis   . Pre-diabetes   . Varicose veins   . Vitamin D deficiency      Allergies  Allergen Reactions  . Accupril [Quinapril Hcl] Cough  . Neosporin [Neomycin-Bacitracin Zn-Polymyx]     unknown  .  Prednisone     Agitated and shaky  . Reglan [Metoclopramide] Other (See Comments)    tremors  . Tramadol Nausea And Vomiting  . Adhesive [Tape] Rash    Current Outpatient Medications on File Prior to Visit  Medication Sig  . acetaminophen (TYLENOL) 500 MG tablet Take 500-1,000 mg by mouth every 6 (six) hours as needed for moderate pain.  Marland Kitchen amiodarone (PACERONE) 200 MG tablet Take 1 tablet (200 mg total) by mouth daily.  Marland Kitchen amLODipine (NORVASC) 5 MG tablet Take 1.5 tablets (7.5 mg total) by mouth daily.  . Ascorbic Acid (VITAMIN C) 1000 MG tablet Take 1,000 mg by mouth daily.   . bumetanide (BUMEX) 1 MG tablet Take 1 mg by mouth daily. Take 1mg  Mon,Wed and Fri  . cholecalciferol (VITAMIN D3) 25 MCG (1000 UT) tablet Take 1,000 Units by mouth daily.   Marland Kitchen ELIQUIS 5 MG TABS tablet Take 1 tablet by mouth twice daily  . loratadine (CLARITIN) 10 MG tablet Take 10 mg by mouth daily as needed for allergies.  . Misc Natural Products (OSTEO BI-FLEX JOINT SHIELD PO) Take 1 tablet by mouth 2 (two) times daily.   . nadolol (CORGARD) 20 MG tablet Take 0.5 tablets (10 mg total) by mouth daily. Take 1 tablet Daily for BP & Tremor  . Naphazoline-Pheniramine (OPCON-A) 0.027-0.315 % SOLN Place 1 drop into both eyes daily as needed (allergies).  . olmesartan (BENICAR) 40 MG tablet Take 1 tablet Daily for BP  . PROAIR HFA 108 (90 Base) MCG/ACT inhaler Use 2 Inhalations 15 minutes apart every 4 hours if needed to Rescue Asthma Attack   No current facility-administered medications on file prior to visit.    ROS: all negative except above.   Physical Exam:  BP 134/68   Pulse (!) 52   Temp 97.7 F (36.5 C)   Ht 5\' 1"  (1.549 m)   Wt 161 lb 6.4 oz (73.2 kg)   SpO2 97%   BMI 30.50 kg/m   General Appearance: Well nourished, well dressed elderly female in no apparent distress. Eyes: PERRLA, conjunctiva no swelling or erythema Sinuses: No Frontal/maxillary tenderness ENT/Mouth: Ext aud canals clear, TMs  without erythema, bulging. No erythema, swelling, or exudate on post pharynx.  Tonsils not swollen or erythematous. Mildly HOH.  Neck: Supple Respiratory: Respiratory effort normal, BS equal bilaterally without rales, rhonchi, wheezing or stridor. Occasional hacking/junky cough. Otherwise speaks in complete sentences.  Cardio: RRR with 2/6 systolic murmur. Brisk peripheral pulses without edema.  Abdomen: Soft, + BS.  Non tender, no guarding, rebound, hernias, masses. Lymphatics: Non tender without lymphadenopathy.  Musculoskeletal: No obvious deformity, normal gait.  Skin: Warm, dry without rashes, lesions, ecchymosis.  Neuro: Cranial nerves intact. Normal muscle tone Psych: Awake and oriented X 3, normal affect, Insight and Judgment appropriate.     Izora Ribas, NP 3:57 PM [facility]

## 2020-04-29 NOTE — Patient Instructions (Addendum)
     Lungs sound clear - no fever  However bronchitic sounding cough - do breztri inhaler sample, 1 puff twice a day, rinse mouth/brush teeth afterward to avoid thrush   Possible viral, or bronchitis - but with new productivity will go ahead and start an antibiotic due to age  Can continue with mucinex if secretions are thick, push fluid intake  Call [facility] or [facility] for drive through covid 19 testing   If getting worse not better in next 2-3 days will order a chest xray    HOW TO TREAT VIRAL COUGH AND COLD SYMPTOMS:  -Symptoms usually last at least 1 week with the worst symptoms being around day 4.  - colds usually start with a sore throat and end with a cough, and the cough can take 2 weeks to get better.  -No antibiotics are needed for colds, flu, sore throats, cough, bronchitis UNLESS symptoms are longer than 7 days OR if you are getting better then get drastically worse.  -There are a lot of combination medications (Dayquil, Nyquil, Vicks 44, tyelnol cold and sinus, ETC). Please look at the ingredients on the back so that you are treating the correct symptoms and not doubling up on medications/ingredients.    Medicines you can use  Nasal congestion  Little Remedies saline spray (aerosol/mist)- can try this, it is in the kids section - pseudoephedrine (Sudafed)- behind the counter, do not use if you have high blood pressure, medicine that have -D in them.  - phenylephrine (Sudafed PE) -Dextormethorphan + chlorpheniramine (Coridcidin HBP)- okay if you have high blood pressure -Oxymetazoline (Afrin) nasal spray- LIMIT to 3 days -Saline nasal spray -Neti pot (used distilled or bottled water)  Ear pain/congestion  -pseudoephedrine (sudafed) - Nasonex/flonase nasal spray  Fever  -Acetaminophen (Tyelnol) -Ibuprofen (Advil, motrin, aleve)  Sore Throat  -Acetaminophen (Tyelnol) -Ibuprofen (Advil, motrin, aleve) -Drink a lot of water -Gargle with salt water - Rest your  voice (don't talk) -Throat sprays -Cough drops  Body Aches  -Acetaminophen (Tyelnol) -Ibuprofen (Advil, motrin, aleve)  Headache  -Acetaminophen (Tyelnol) -Ibuprofen (Advil, motrin, aleve) - Exedrin, Exedrin Migraine  Allergy symptoms (cough, sneeze, runny nose, itchy eyes) -Claritin or loratadine cheapest but likely the weakest  -Zyrtec or certizine at night because it can make you sleepy -The strongest is allegra or fexafinadine  Cheapest at [facility], [facility], [facility]  Cough  -Dextromethorphan (Delsym)- medicine that has DM in it -Guafenesin (Mucinex/Robitussin) - cough drops - drink lots of water  Chest Congestion  -Guafenesin (Mucinex/Robitussin)  Red Itchy Eyes  - Naphcon-A  Upset Stomach  - Bland diet (nothing spicy, greasy, fried, and high acid foods like tomatoes, oranges, berries) -OKAY- cereal, bread, soup, crackers, rice -Eat smaller more frequent meals -reduce caffeine, no alcohol -Loperamide (Imodium-AD) if diarrhea -Prevacid for heart burn  General health when sick  -Hydration -wash your hands frequently -keep surfaces clean -change pillow cases and sheets often -Get fresh air but do not exercise strenuously -Vitamin D, double up on it - Vitamin C -Zinc

## 2020-05-06 ENCOUNTER — Ambulatory Visit: Payer: Medicare Other | Admitting: Cardiovascular Disease

## 2020-05-27 ENCOUNTER — Other Ambulatory Visit: Payer: Self-pay

## 2020-05-27 ENCOUNTER — Ambulatory Visit (INDEPENDENT_AMBULATORY_CARE_PROVIDER_SITE_OTHER): Payer: Medicare Other | Admitting: Adult Health

## 2020-05-27 ENCOUNTER — Encounter: Payer: Self-pay | Admitting: Adult Health

## 2020-05-27 VITALS — BP 140/70 | HR 54 | Temp 97.7°F | Wt 162.0 lb

## 2020-05-27 DIAGNOSIS — B0222 Postherpetic trigeminal neuralgia: Secondary | ICD-10-CM

## 2020-05-27 DIAGNOSIS — B029 Zoster without complications: Secondary | ICD-10-CM | POA: Insufficient documentation

## 2020-05-27 MED ORDER — GABAPENTIN 100 MG PO CAPS: 100.0000 mg | ORAL_CAPSULE | Freq: Three times a day (TID) | ORAL | 0 refills | Status: DC | PRN

## 2020-05-27 MED ORDER — DEXAMETHASONE 0.5 MG PO TABS: ORAL_TABLET | ORAL | 0 refills | Status: DC

## 2020-05-27 MED ORDER — VALACYCLOVIR HCL 1 G PO TABS
1000.0000 mg | ORAL_TABLET | Freq: Three times a day (TID) | ORAL | 0 refills | Status: DC
Start: 2020-05-27 — End: 2020-06-17

## 2020-05-27 NOTE — Patient Instructions (Signed)
Shingles  Shingles, which is also known as herpes zoster, is an infection that causes a painful skin rash and fluid-filled blisters. It is caused by a virus. Shingles only develops in people who:  Have had chickenpox.  Have been given a medicine to protect against chickenpox (have been vaccinated). Shingles is rare in this group. What are the causes? Shingles is caused by varicella-zoster virus (VZV). This is the same virus that causes chickenpox. After a person is exposed to VZV, the virus stays in the body in an inactive (dormant) state. Shingles develops if the virus is reactivated. This can happen many years after the first (initial) exposure to VZV. It is not known what causes this virus to be reactivated. What increases the risk? People who have had chickenpox or received the chickenpox vaccine are at risk for shingles. Shingles infection is more common in people who:  Are older than age 64.  Have a weakened disease-fighting system (immune system), such as people with: ? HIV. ? AIDS. ? Cancer.  Are taking medicines that weaken the immune system, such as transplant medicines.  Are experiencing a lot of stress. What are the signs or symptoms? Early symptoms of this condition include itching, tingling, and pain in an area on your skin. Pain may be described as burning, stabbing, or throbbing. A few days or weeks after early symptoms start, a painful red rash appears. The rash is usually on one side of the body and has a band-like or belt-like pattern. The rash eventually turns into fluid-filled blisters that break open, change into scabs, and dry up in about 2-3 weeks. At any time during the infection, you may also develop:  A fever.  Chills.  A headache.  An upset stomach. How is this diagnosed? This condition is diagnosed with a skin exam. Skin or fluid samples may be taken from the blisters before a diagnosis is made. These samples are examined under a microscope or sent  to a lab for testing. How is this treated? The rash may last for several weeks. There is not a specific cure for this condition. Your health care provider will probably prescribe medicines to help you manage pain, recover more quickly, and avoid long-term problems. Medicines may include:  Antiviral drugs.  Anti-inflammatory drugs.  Pain medicines.  Anti-itching medicines (antihistamines). If the area involved is on your face, you may be referred to a specialist, such as an eye doctor (ophthalmologist) or an ear, nose, and throat (ENT) doctor (otolaryngologist) to help you avoid eye problems, chronic pain, or disability. Follow these instructions at home: Medicines  Take over-the-counter and prescription medicines only as told by your health care provider.  Apply an anti-itch cream or numbing cream to the affected area as told by your health care provider. Relieving itching and discomfort   Apply cold, wet cloths (cold compresses) to the area of the rash or blisters as told by your health care provider.  Cool baths can be soothing. Try adding baking soda or dry oatmeal to the water to reduce itching. Do not bathe in hot water. Blister and rash care  Keep your rash covered with a loose bandage (dressing). Wear loose-fitting clothing to help ease the pain of material rubbing against the rash.  Keep your rash and blisters clean by washing the area with mild soap and cool water as told by your health care provider.  Check your rash every day for signs of infection. Check for: ? More redness, swelling, or pain. ?  Fluid or blood. ? Warmth. ? Pus or a bad smell.  Do not scratch your rash or pick at your blisters. To help avoid scratching: ? Keep your fingernails clean and cut short. ? Wear gloves or mittens while you sleep, if scratching is a problem. General instructions  Rest as told by your health care provider.  Keep all follow-up visits as told by your health care provider.  This is important.  Wash your hands often with soap and water. If soap and water are not available, use hand sanitizer. Doing this lowers your chance of getting a bacterial skin infection.  Before your blisters change into scabs, your shingles infection can cause chickenpox in people who have never had it or have never been vaccinated against it. To prevent this from happening, avoid contact with other people, especially: ? Babies. ? Pregnant women. ? Children who have eczema. ? Elderly people who have transplants. ? People who have chronic illnesses, such as cancer or AIDS. Contact a health care provider if:  Your pain is not relieved with prescribed medicines.  Your pain does not get better after the rash heals.  You have signs of infection in the rash area, such as: ? More redness, swelling, or pain around the rash. ? Fluid or blood coming from the rash. ? The rash area feeling warm to the touch. ? Pus or a bad smell coming from the rash. Get help right away if:  The rash is on your face or nose.  You have facial pain, pain around your eye area, or loss of feeling on one side of your face.  You have difficulty seeing.  You have ear pain or have ringing in your ear.  You have a loss of taste.  Your condition gets worse. Summary  Shingles, which is also known as herpes zoster, is an infection that causes a painful skin rash and fluid-filled blisters.  This condition is diagnosed with a skin exam. Skin or fluid samples may be taken from the blisters and examined before the diagnosis is made.  Keep your rash covered with a loose bandage (dressing). Wear loose-fitting clothing to help ease the pain of material rubbing against the rash.  Before your blisters change into scabs, your shingles infection can cause chickenpox in people who have never had it or have never been vaccinated against it. This information is not intended to replace advice given to you by your health care  provider. Make sure you discuss any questions you have with your health care provider. Document Revised: 02/24/2019 Document Reviewed: 07/07/2017 Elsevier Patient Education  [place].      Valacyclovir caplets What is this medicine? VALACYCLOVIR (val ay SYE kloe veer) is an antiviral medicine. It is used to treat or prevent infections caused by certain kinds of viruses. Examples of these infections include herpes and shingles. This medicine will not cure herpes. This medicine may be used for other purposes; ask your health care provider or pharmacist if you have questions. COMMON BRAND NAME(S): Valtrex What should I tell my health care provider before I take this medicine? They need to know if you have any of these conditions:  acquired immunodeficiency syndrome (AIDS)  any other condition that may weaken the immune system  bone marrow or kidney transplant  kidney disease  an unusual or allergic reaction to valacyclovir, acyclovir, ganciclovir, valganciclovir, other medicines, foods, dyes, or preservatives  pregnant or trying to get pregnant  breast-feeding How should I use this medicine? Take  this medicine by mouth with a glass of water. Follow the directions on the prescription label. You can take this medicine with or without food. Take your doses at regular intervals. Do not take your medicine more often than directed. Finish the full course prescribed by your doctor or health care professional even if you think your condition is better. Do not stop taking except on the advice of your doctor or health care professional. Talk to your pediatrician regarding the use of this medicine in children. While this drug may be prescribed for children as young as 2 years for selected conditions, precautions do apply. Overdosage: If you think you have taken too much of this medicine contact a poison control center or emergency room at once. NOTE: This medicine is only for you. Do not  share this medicine with others. What if I miss a dose? If you miss a dose, take it as soon as you can. If it is almost time for your next dose, take only that dose. Do not take double or extra doses. What may interact with this medicine? Do not take this medicine with any of the following medications:  cidofovir This medicine may also interact with the following medications:  adefovir  amphotericin B  certain antibiotics like amikacin, gentamicin, tobramycin, vancomycin  cimetidine  cisplatin  colistin  cyclosporine  foscarnet  lithium  methotrexate  probenecid  tacrolimus This list may not describe all possible interactions. Give your health care provider a list of all the medicines, herbs, non-prescription drugs, or dietary supplements you use. Also tell them if you smoke, drink alcohol, or use illegal drugs. Some items may interact with your medicine. What should I watch for while using this medicine? Tell your doctor or health care professional if your symptoms do not start to get better after 1 week. This medicine works best when taken early in the course of an infection, within the first 40 hours. Begin treatment as soon as possible after the first signs of infection like tingling, itching, or pain in the affected area. It is possible that genital herpes may still be spread even when you are not having symptoms. Always use safer sex practices like condoms made of latex or polyurethane whenever you have sexual contact. You should stay well hydrated while taking this medicine. Drink plenty of fluids. What side effects may I notice from receiving this medicine? Side effects that you should report to your doctor or health care professional as soon as possible:  allergic reactions like skin rash, itching or hives, swelling of the face, lips, or tongue  aggressive behavior  confusion  hallucinations  problems with balance, talking, walking  stomach  pain  tremor  trouble passing urine or change in the amount of urine Side effects that usually do not require medical attention (report to your doctor or health care professional if they continue or are bothersome):  dizziness  headache  nausea, vomiting This list may not describe all possible side effects. Call your doctor for medical advice about side effects. You may report side effects to FDA at 1-800-FDA-1088. Where should I keep my medicine? Keep out of the reach of children. Store at room temperature between 15 and 25 degrees C (59 and 77 degrees F). Keep container tightly closed. Throw away any unused medicine after the expiration date. NOTE: This sheet is a summary. It may not cover all possible information. If you have questions about this medicine, talk to your doctor, pharmacist, or health care provider.  2020 Elsevier/Gold Standard (2018-11-29 12:22:33)      Gabapentin capsules or tablets What is this medicine? GABAPENTIN (GA ba pen tin) is used to control seizures in certain types of epilepsy. It is also used to treat certain types of nerve pain. This medicine may be used for other purposes; ask your health care provider or pharmacist if you have questions. COMMON BRAND NAME(S): Active-PAC with Gabapentin, Gabarone, Neurontin What should I tell my health care provider before I take this medicine? They need to know if you have any of these conditions:  history of drug abuse or alcohol abuse problem  kidney disease  lung or breathing disease  suicidal thoughts, plans, or attempt; a previous suicide attempt by you or a family member  an unusual or allergic reaction to gabapentin, other medicines, foods, dyes, or preservatives  pregnant or trying to get pregnant  breast-feeding How should I use this medicine? Take this medicine by mouth with a glass of water. Follow the directions on the prescription label. You can take it with or without food. If it upsets  your stomach, take it with food. Take your medicine at regular intervals. Do not take it more often than directed. Do not stop taking except on your doctor's advice. If you are directed to break the 600 or 800 mg tablets in half as part of your dose, the extra half tablet should be used for the next dose. If you have not used the extra half tablet within 28 days, it should be thrown away. A special MedGuide will be given to you by the pharmacist with each prescription and refill. Be sure to read this information carefully each time. Talk to your pediatrician regarding the use of this medicine in children. While this drug may be prescribed for children as young as 3 years for selected conditions, precautions do apply. Overdosage: If you think you have taken too much of this medicine contact a poison control center or emergency room at once. NOTE: This medicine is only for you. Do not share this medicine with others. What if I miss a dose? If you miss a dose, take it as soon as you can. If it is almost time for your next dose, take only that dose. Do not take double or extra doses. What may interact with this medicine? This medicine may interact with the following medications:  alcohol  antihistamines for allergy, cough, and cold  certain medicines for anxiety or sleep  certain medicines for depression like amitriptyline, fluoxetine, sertraline  certain medicines for seizures like phenobarbital, primidone  certain medicines for stomach problems  general anesthetics like halothane, isoflurane, methoxyflurane, propofol  local anesthetics like lidocaine, pramoxine, tetracaine  medicines that relax muscles for surgery  narcotic medicines for pain  phenothiazines like chlorpromazine, mesoridazine, prochlorperazine, thioridazine This list may not describe all possible interactions. Give your health care provider a list of all the medicines, herbs, non-prescription drugs, or dietary  supplements you use. Also tell them if you smoke, drink alcohol, or use illegal drugs. Some items may interact with your medicine. What should I watch for while using this medicine? Visit your doctor or health care provider for regular checks on your progress. You may want to keep a record at home of how you feel your condition is responding to treatment. You may want to share this information with your doctor or health care provider at each visit. You should contact your doctor or health care provider if your seizures get worse or if  you have any new types of seizures. Do not stop taking this medicine or any of your seizure medicines unless instructed by your doctor or health care provider. Stopping your medicine suddenly can increase your seizures or their severity. This medicine may cause serious skin reactions. They can happen weeks to months after starting the medicine. Contact your health care provider right away if you notice fevers or flu-like symptoms with a rash. The rash may be red or purple and then turn into blisters or peeling of the skin. Or, you might notice a red rash with swelling of the face, lips or lymph nodes in your neck or under your arms. Wear a medical identification bracelet or chain if you are taking this medicine for seizures, and carry a card that lists all your medications. You may get drowsy, dizzy, or have blurred vision. Do not drive, use machinery, or do anything that needs mental alertness until you know how this medicine affects you. To reduce dizzy or fainting spells, do not sit or stand up quickly, especially if you are an older patient. Alcohol can increase drowsiness and dizziness. Avoid alcoholic drinks. Your mouth may get dry. Chewing sugarless gum or sucking hard candy, and drinking plenty of water will help. The use of this medicine may increase the chance of suicidal thoughts or actions. Pay special attention to how you are responding while on this medicine. Any  worsening of mood, or thoughts of suicide or dying should be reported to your health care provider right away. Women who become pregnant while using this medicine may enroll in the [place] Pregnancy Registry by calling [phone]. This registry collects information about the safety of antiepileptic drug use during pregnancy. What side effects may I notice from receiving this medicine? Side effects that you should report to your doctor or health care professional as soon as possible:  allergic reactions like skin rash, itching or hives, swelling of the face, lips, or tongue  breathing problems  rash, fever, and swollen lymph nodes  redness, blistering, peeling or loosening of the skin, including inside the mouth  suicidal thoughts, mood changes Side effects that usually do not require medical attention (report to your doctor or health care professional if they continue or are bothersome):  dizziness  drowsiness  headache  nausea, vomiting  swelling of ankles, feet, hands  tiredness This list may not describe all possible side effects. Call your doctor for medical advice about side effects. You may report side effects to FDA at 1-800-FDA-1088. Where should I keep my medicine? Keep out of reach of children. This medicine may cause accidental overdose and death if it taken by other adults, children, or pets. Mix any unused medicine with a substance like cat litter or coffee grounds. Then throw the medicine away in a sealed container like a sealed bag or a coffee can with a lid. Do not use the medicine after the expiration date. Store at room temperature between 15 and 30 degrees C (59 and 86 degrees F). NOTE: This sheet is a summary. It may not cover all possible information. If you have questions about this medicine, talk to your doctor, pharmacist, or health care provider.  2020 Elsevier/Gold Standard (2019-02-03 14:16:43)

## 2020-05-27 NOTE — Progress Notes (Signed)
Assessment and Plan:  Lupe was seen today for jaw pain and ear pain.  Diagnoses and all orders for this visit:  Acute trigeminal herpes zoster Herpes zoster infection of mandibular region Acute herpes zoster of R V3 distribution, ear pain but without lesion to EAC or TM, auditory sx or facial paralysis - fortunately appears does not have Ramsay Hunt  Initiating valtrex, decadron, gabapentin for pain, unfortunately slightly delayed initiation >72 hours after onset. The patient was informed that the vesicles can cause chicken pox in a person that is not immune, will do close follow up in 48-72 hours for progress monitoring due to severity and patient planning on leaving town this weekend and next week.  Advised go to the ER if headache, facial paralysis, changes vision/speech, hearing, imbalance, weakness. -     dexamethasone (DECADRON) 0.5 MG tablet; Take 1 tab 3 x day - 3 days, then 2 x day - 3 days, then 1 tab daily -     valACYclovir (VALTREX) 1000 MG tablet; Take 1 tablet (1,000 mg total) by mouth 3 (three) times daily. Take for 7 days -     gabapentin (NEURONTIN) 100 MG capsule; Take 1-3 capsules (100-300 mg total) by mouth 3 (three) times daily as needed.  Further disposition pending results of labs. Discussed med's effects and SE's.   Over 15 minutes of exam, counseling, chart review, and critical decision making was performed.   Future Appointments  Date Time Provider [place]  05/30/2020  3:30 PM [name], NP [facility] None  06/25/2020  2:00 PM Troy Sine, MD [facility]  08/29/2020  3:45 PM Vicie Mutters, PA-C [facility] None  03/05/2021  3:00 PM Unk Pinto, MD [facility] None    ------------------------------------------------------------------------------------------------------------------   HPI BP 140/70   Pulse (!) 54   Temp 97.7 F (36.5 C)   Wt 162 lb (73.5 kg)   SpO2 97%   BMI 30.61 kg/m   86 y.o.female with well controlled  T2DM, htn, a. Fib on elequis, CHF presents for evaluation of new onset ear pain and facial edema with rash of left jaw and lip.   She reports started having stabbing pain of her left ear, teeth and jaw, "sharp," 3 days ago (Fri also noted some shooting down neck, then started noting some swelling in left lower jaw and neck, thought she might have a tooth infection or abscess, then blistered rash began yesterday and worse this AM and presented for evaluation. She has noted food doesn't seem to taste as good, odd taste in mouth with cheek pain.   She denies fever/chills, sore throat, headache, nausea, confusion, vision changes, eye pain, tearing of eye. Denies facial paralysis, dizziness, changes in hearing, tinnitus, otosensitivity.   She has had shingles x 2, had zostavax 2014  Past Medical History:  Diagnosis Date  . Acute on chronic diastolic (congestive) heart failure ([place]) 11/21/2018  . Cancer ([place])    skin cancer  . DDD (degenerative disc disease)   . Diverticula of colon   . Diverticulitis   . DJD (degenerative joint disease)   . GERD (gastroesophageal reflux disease)    takes ranitidine  . Heart murmur   . History of hiatal hernia   . History of pneumonia   . Hx of irritable bowel syndrome   . Hyperlipidemia   . Occasional tremors   . Osteoporosis   . Pre-diabetes   . Varicose veins   . Vitamin D deficiency      Allergies  Allergen  Reactions  . Accupril [Quinapril Hcl] Cough  . Neosporin [Neomycin-Bacitracin Zn-Polymyx]     unknown  . Prednisone     Agitated and shaky  . Reglan [Metoclopramide] Other (See Comments)    tremors  . Tramadol Nausea And Vomiting  . Adhesive [Tape] Rash    Current Outpatient Medications on File Prior to Visit  Medication Sig  . acetaminophen (TYLENOL) 500 MG tablet Take 500-1,000 mg by mouth every 6 (six) hours as needed for moderate pain.  Marland Kitchen amiodarone (PACERONE) 200 MG tablet Take 1 tablet (200 mg total) by mouth daily.  . Ascorbic  Acid (VITAMIN C) 1000 MG tablet Take 1,000 mg by mouth daily.   . bumetanide (BUMEX) 1 MG tablet Take 1 mg by mouth daily. Take 1mg  Mon,Wed and Fri  . cholecalciferol (VITAMIN D3) 25 MCG (1000 UT) tablet Take 1,000 Units by mouth daily.   Marland Kitchen ELIQUIS 5 MG TABS tablet Take 1 tablet by mouth twice daily  . loratadine (CLARITIN) 10 MG tablet Take 10 mg by mouth daily as needed for allergies.  . Misc Natural Products (OSTEO BI-FLEX JOINT SHIELD PO) Take 1 tablet by mouth 2 (two) times daily.   . nadolol (CORGARD) 20 MG tablet Take 0.5 tablets (10 mg total) by mouth daily. Take 1 tablet Daily for BP & Tremor  . Naphazoline-Pheniramine (OPCON-A) 0.027-0.315 % SOLN Place 1 drop into both eyes daily as needed (allergies).  . olmesartan (BENICAR) 40 MG tablet Take 1 tablet Daily for BP  . PROAIR HFA 108 (90 Base) MCG/ACT inhaler Use 2 Inhalations 15 minutes apart every 4 hours if needed to Rescue Asthma Attack  . promethazine-dextromethorphan (PROMETHAZINE-DM) 6.25-15 MG/5ML syrup Take 5 mLs by mouth 4 (four) times daily as needed for cough.  Marland Kitchen amLODipine (NORVASC) 5 MG tablet Take 1.5 tablets (7.5 mg total) by mouth daily.   No current facility-administered medications on file prior to visit.    ROS: all negative except above.   Physical Exam:  BP 140/70   Pulse (!) 54   Temp 97.7 F (36.5 C)   Wt 162 lb (73.5 kg)   SpO2 97%   BMI 30.61 kg/m   General Appearance: Well nourished, well dressed elderly female, appears uncomfortable but in no apparent distress. Eyes: PERRLA, EOMs, conjunctiva no swelling or erythema Sinuses: No Frontal/maxillary tenderness ENT/Mouth: Ext aud canals clear, TMs without erythema, bulging. No auricular, tragal or mastoid erythema or tenderness. No erythema, swelling, or exudate on post pharynx. She does have 2-3 clustered vesicular lesions to inner left cheek and lateral tongue. Tonsils not swollen or erythematous. Hearing normal. Generalized swelling of left law and  left lower lip with vesicular eruptions in linear pattern along left lower jaw and lower cheek and lower lip with erythematous base, no crusting discharge.  Neck: Supple, thyroid normal.  Respiratory: Respiratory effort normal, BS equal bilaterally without rales, rhonchi, wheezing or stridor.  Cardio: RRR with no MRGs. Brisk peripheral pulses without edema.  Abdomen: Soft, + BS.  Non tender, no guarding, rebound, hernias, masses. Lymphatics: Some non-specific tenderness to left upper neck without lymphadenopathy.  Musculoskeletal: No TMJ tenderness,  normal gait.  Skin: Warm, dry; she has vesicular eruptions of right lower cheek, jaw and lower lip with erythematous base in lineal pattern following V3 distibution, small cluster to inner cheek and lateral tongue Neuro: Cranial nerves intact. Normal muscle tone, no cerebellar symptoms.   Psych: Awake and oriented X 3, normal affect, Insight and Judgment appropriate.  Izora Ribas, NP 12:34 PM [facility]

## 2020-05-29 ENCOUNTER — Ambulatory Visit: Payer: Medicare Other | Admitting: Adult Health

## 2020-05-30 ENCOUNTER — Encounter: Payer: Self-pay | Admitting: Adult Health

## 2020-05-30 ENCOUNTER — Other Ambulatory Visit: Payer: Self-pay

## 2020-05-30 ENCOUNTER — Ambulatory Visit (INDEPENDENT_AMBULATORY_CARE_PROVIDER_SITE_OTHER): Payer: Medicare Other | Admitting: Adult Health

## 2020-05-30 VITALS — BP 124/60 | HR 49 | Temp 97.5°F | Wt 160.0 lb

## 2020-05-30 DIAGNOSIS — B029 Zoster without complications: Secondary | ICD-10-CM

## 2020-05-30 NOTE — Progress Notes (Signed)
Assessment and Plan:  Briana [name] was seen today for jaw pain and ear pain.  Diagnoses and all orders for this visit:  Acute trigeminal herpes zoster Herpes zoster infection of mandibular region Acute herpes zoster of R V3 distribution, ear pain but without lesion to EAC or TM, auditory sx or facial paralysis - fortunately appears does not have Ramsay Hunt, no ophthalmic involvement 72 hour follow up, doing well with valtrex, decadron, gabapentin for pain Continue to monitor for progress Reminded that the vesicles can cause chicken pox in a person that is not immune, will follow up PRN.  Advised go to the ER if headache, facial paralysis, changes vision/speech, hearing, imbalance, weakness. -     gabapentin (NEURONTIN) 100 MG capsule; Take 1-3 capsules (100-300 mg total) by mouth 3 (three) times daily as needed.  Further disposition pending results of labs. Discussed med's effects and SE's.   Over 15 minutes of exam, counseling, chart review, and critical decision making was performed.   Future Appointments  Date Time Provider [place]  06/25/2020  2:00 PM Troy Sine, MD [facility]  08/29/2020  3:45 PM Vicie Mutters, PA-C [facility] None  03/05/2021  3:00 PM Unk Pinto, MD [facility] None    ------------------------------------------------------------------------------------------------------------------   HPI BP 124/60   Pulse (!) 49   Temp (!) 97.5 F (36.4 C)   Wt 160 lb (72.6 kg)   SpO2 96%   BMI 30.23 kg/m   86 y.o.female with well controlled T2DM, htn, a. Fib on elequis, CHF presents for 3 day follow up on V3 facial shingles Briana [name] that began last Friday (05/24/2020) with burning pain, was evaluated and treatment with valtrex, decadron, gabapentin initiated on Monday 05/27/20. She had ear pain but no other signs of Ramsay Hunt at last visit, no lesions of eye, no vision changes. Denies tinnitis, otosensitivity, HA, dizziness, facial paralysis,  confusion.    She reports sx were worse on Tuesday, then improved yesterday and today. Has been doing well with decadron, taking gabapentin 200 mg TID with good results. Denies fever, does endorse some chills, denies HA, dizziness, vision changes. Endorses ongoing strange taste in mouth.   She is here with daughter, [name]. They decided to cancel the beach trip they had planned for this weekend. Patient and daughter feel she is doing well, pain is well managed by current regimen.   She has had shingles x 2, had zostavax 2014  Past Medical History:  Diagnosis Date  . Acute on chronic diastolic (congestive) heart failure ([place]) 11/21/2018  . Cancer ([place])    skin cancer  . DDD (degenerative disc disease)   . Diverticula of colon   . Diverticulitis   . DJD (degenerative joint disease)   . GERD (gastroesophageal reflux disease)    takes ranitidine  . Heart murmur   . History of hiatal hernia   . History of pneumonia   . Hx of irritable bowel syndrome   . Hyperlipidemia   . Occasional tremors   . Osteoporosis   . Pre-diabetes   . Varicose veins   . Vitamin D deficiency      Allergies  Allergen Reactions  . Accupril [Quinapril Hcl] Cough  . Neosporin [Neomycin-Bacitracin Zn-Polymyx]     unknown  . Prednisone     Agitated and shaky  . Reglan [Metoclopramide] Other (See Comments)    tremors  . Tramadol Nausea And Vomiting  . Adhesive [Tape] Rash    Current Outpatient Medications on File Prior to Visit  Medication  Sig  . acetaminophen (TYLENOL) 500 MG tablet Take 500-1,000 mg by mouth every 6 (six) hours as needed for moderate pain.  Marland Kitchen amiodarone (PACERONE) 200 MG tablet Take 1 tablet (200 mg total) by mouth daily.  . Ascorbic Acid (VITAMIN C) 1000 MG tablet Take 1,000 mg by mouth daily.   . bumetanide (BUMEX) 1 MG tablet Take 1 mg by mouth daily. Take 1mg  Mon,Wed and Fri  . cholecalciferol (VITAMIN D3) 25 MCG (1000 UT) tablet Take 1,000 Units by mouth daily.   Marland Kitchen dexamethasone  (DECADRON) 0.5 MG tablet Take 1 tab 3 x day - 3 days, then 2 x day - 3 days, then 1 tab daily  . ELIQUIS 5 MG TABS tablet Take 1 tablet by mouth twice daily  . gabapentin (NEURONTIN) 100 MG capsule Take 1-3 capsules (100-300 mg total) by mouth 3 (three) times daily as needed.  . loratadine (CLARITIN) 10 MG tablet Take 10 mg by mouth daily as needed for allergies.  . Misc Natural Products (OSTEO BI-FLEX JOINT SHIELD PO) Take 1 tablet by mouth 2 (two) times daily.   . nadolol (CORGARD) 20 MG tablet Take 0.5 tablets (10 mg total) by mouth daily. Take 1 tablet Daily for BP & Tremor  . Naphazoline-Pheniramine (OPCON-A) 0.027-0.315 % SOLN Place 1 drop into both eyes daily as needed (allergies).  . olmesartan (BENICAR) 40 MG tablet Take 1 tablet Daily for BP  . PROAIR HFA 108 (90 Base) MCG/ACT inhaler Use 2 Inhalations 15 minutes apart every 4 hours if needed to Rescue Asthma Attack  . promethazine-dextromethorphan (PROMETHAZINE-DM) 6.25-15 MG/5ML syrup Take 5 mLs by mouth 4 (four) times daily as needed for cough.  . valACYclovir (VALTREX) 1000 MG tablet Take 1 tablet (1,000 mg total) by mouth 3 (three) times daily. Take for 7 days  . amLODipine (NORVASC) 5 MG tablet Take 1.5 tablets (7.5 mg total) by mouth daily.   No current facility-administered medications on file prior to visit.    ROS: all negative except above.   Physical Exam:  BP 124/60   Pulse (!) 49   Temp (!) 97.5 F (36.4 C)   Wt 160 lb (72.6 kg)   SpO2 96%   BMI 30.23 kg/m   General Appearance: Well nourished, well dressed elderly female, appears uncomfortable but in no apparent distress. Eyes: PERRLA, EOMs, conjunctiva no swelling or erythema Sinuses: No Frontal/maxillary tenderness ENT/Mouth: Ext aud canals clear, TMs without erythema, bulging. No auricular, tragal or mastoid erythema or tenderness. No erythema, swelling, or exudate on post pharynx. She does have 2-3 clustered vesicular lesions to inner left cheek, and  lateral tongue. Tonsils not swollen or erythematous. Hearing normal. Generalized swelling of left law and left lower lip with vesicular eruptions in linear pattern along left lower jaw and lower cheek and lower lip with erythematous base, crusted lesions.  Neck: Supple, thyroid normal.  Respiratory: Respiratory effort normal, BS equal bilaterally without rales, rhonchi, wheezing or stridor.  Cardio: RRR with no MRGs. Brisk peripheral pulses without edema.  Abdomen: Soft, + BS.  Non tender, no guarding, rebound, hernias, masses. Lymphatics: Some non-specific tenderness to left upper neck without lymphadenopathy.  Musculoskeletal: No TMJ tenderness,  normal gait.  Skin: Warm, dry; she has vesicular eruptions of left preauricle, lower cheek, jaw and lower lip with erythematous base in lineal pattern following V3 distibution, small cluster to inner cheek and lateral tongue. Crusted lesions.  Neuro: Cranial nerves intact. Normal muscle tone, no cerebellar symptoms.   Psych: Awake and  oriented X 3, normal affect, Insight and Judgment appropriate.     Izora Ribas, NP 4:05 PM [facility]

## 2020-05-30 NOTE — Patient Instructions (Signed)
Zoster Vaccine, Recombinant injection What is this medicine? ZOSTER VACCINE (ZOS ter vak SEEN) is used to prevent shingles in adults 84 years old and over. This vaccine is not used to treat shingles or nerve pain from shingles. This medicine may be used for other purposes; ask your health care provider or pharmacist if you have questions. COMMON BRAND NAME(S): SHINGRIX What should I tell my health care provider before I take this medicine? They need to know if you have any of these conditions:  blood disorders or disease  cancer like leukemia or lymphoma  immune system problems or therapy  an unusual or allergic reaction to vaccines, other medications, foods, dyes, or preservatives  pregnant or trying to get pregnant  breast-feeding How should I use this medicine? This vaccine is for injection in a muscle. It is given by a health care professional. Talk to your pediatrician regarding the use of this medicine in children. This medicine is not approved for use in children. Overdosage: If you think you have taken too much of this medicine contact a poison control center or emergency room at once. NOTE: This medicine is only for you. Do not share this medicine with others. What if I miss a dose? Keep appointments for follow-up (booster) doses as directed. It is important not to miss your dose. Call your doctor or health care professional if you are unable to keep an appointment. What may interact with this medicine?  medicines that suppress your immune system  medicines to treat cancer  steroid medicines like prednisone or cortisone This list may not describe all possible interactions. Give your health care provider a list of all the medicines, herbs, non-prescription drugs, or dietary supplements you use. Also tell them if you smoke, drink alcohol, or use illegal drugs. Some items may interact with your medicine. What should I watch for while using this medicine? Visit your doctor for  regular check ups. This vaccine, like all vaccines, may not fully protect everyone. What side effects may I notice from receiving this medicine? Side effects that you should report to your doctor or health care professional as soon as possible:  allergic reactions like skin rash, itching or hives, swelling of the face, lips, or tongue  breathing problems Side effects that usually do not require medical attention (report these to your doctor or health care professional if they continue or are bothersome):  chills  headache  fever  nausea, vomiting  redness, warmth, pain, swelling or itching at site where injected  tiredness This list may not describe all possible side effects. Call your doctor for medical advice about side effects. You may report side effects to FDA at 1-800-FDA-1088. Where should I keep my medicine? This vaccine is only given in a clinic, pharmacy, doctor's office, or other health care setting and will not be stored at home. NOTE: This sheet is a summary. It may not cover all possible information. If you have questions about this medicine, talk to your doctor, pharmacist, or health care provider.  2020 Elsevier/Gold Standard (2017-06-14 13:20:30)  

## 2020-06-10 ENCOUNTER — Other Ambulatory Visit: Payer: Self-pay | Admitting: Adult Health

## 2020-06-10 ENCOUNTER — Telehealth: Payer: Self-pay | Admitting: Adult Health

## 2020-06-10 MED ORDER — PREGABALIN 25 MG PO CAPS: 25.0000 mg | ORAL_CAPSULE | Freq: Three times a day (TID) | ORAL | 0 refills | Status: DC | PRN

## 2020-06-10 NOTE — Telephone Encounter (Signed)
patient called to report continued tenderness and pain from Shingles on neck, left ear and left side of face. She asks if there is another pain medicine for shingles she can take. Pharm: [facility]- [facility].

## 2020-06-17 ENCOUNTER — Other Ambulatory Visit: Payer: Self-pay

## 2020-06-17 ENCOUNTER — Encounter: Payer: Self-pay | Admitting: Adult Health

## 2020-06-17 ENCOUNTER — Ambulatory Visit (INDEPENDENT_AMBULATORY_CARE_PROVIDER_SITE_OTHER): Payer: Medicare Other | Admitting: Adult Health

## 2020-06-17 VITALS — BP 146/80 | HR 45 | Temp 96.8°F | Wt 162.0 lb

## 2020-06-17 DIAGNOSIS — B029 Zoster without complications: Secondary | ICD-10-CM

## 2020-06-17 MED ORDER — PREGABALIN 25 MG PO CAPS: 75.0000 mg | ORAL_CAPSULE | Freq: Three times a day (TID) | ORAL | 0 refills | Status: DC | PRN

## 2020-06-17 MED ORDER — VALACYCLOVIR HCL 1 G PO TABS: 1000.0000 mg | ORAL_TABLET | Freq: Three times a day (TID) | ORAL | 0 refills | Status: DC

## 2020-06-17 MED ORDER — DEXAMETHASONE 0.5 MG PO TABS: ORAL_TABLET | ORAL | 0 refills | Status: DC

## 2020-06-17 NOTE — Patient Instructions (Signed)
Postherpetic Neuralgia Postherpetic neuralgia (PHN) is nerve pain that occurs after a shingles infection. Shingles is a painful rash that appears on one area of the body, usually on the trunk or face. Shingles is caused by the varicella-zoster virus. This is the same virus that causes chickenpox. In people who have had chickenpox, the virus can resurface years later and cause shingles. You may have PHN if you continue to have pain for 4 months after your shingles rash has gone away. PHN appears in the same area where you had the shingles rash. The pain usually goes away after the rash disappears. Getting a vaccination for shingles can prevent PHN. This vaccine is recommended for people older than 60. It may prevent shingles, and may also lower your risk of PHN if you do get shingles. What are the causes? This condition is caused by damage to your nerves from the varicella-zoster virus. The damage makes your nerves overly sensitive. What increases the risk? The following factors may make you more likely to develop this condition:  Being older than 84 years of age.  Having severe pain before your shingles rash starts.  Having a severe rash.  Having shingles in and around the eye area.  Having a disease that makes your body unable to fight infections (weak immune system). What are the signs or symptoms? The main symptom of this condition is pain. The pain may:  Often be very bad and may be described as stabbing, burning, or feeling like an electric shock.  Come and go or may be there all the time.  Be triggered by light touches on the skin or changes in temperature. You may have itching along with the pain. How is this diagnosed? This condition may be diagnosed based on your symptoms and your history of shingles. Lab studies and other diagnostic tests are usually not needed. How is this treated? There is no cure for this condition. Treatment for PHN will focus on pain relief.  Over-the-counter pain relievers do not usually relieve PHN pain. You may need to work with a pain specialist. Treatment may include:  Antidepressant medicines to help with pain and improve sleep.  Anti-seizure medicines to relieve nerve pain.  Strong pain relievers (opioids).  A numbing patch worn on the skin (lidocaine patch).  Botox (botulinum toxin) injections to block pain signals between nerves and muscles.  Injections of numbing medicine or anti-inflammatory medicines around irritated nerves. Follow these instructions at home:   It may take a long time to recover from PHN. Work closely with your health care provider and develop a good support system at home.  Take over-the-counter and prescription medicines only as told by your health care provider.  Do not drive or use heavy machinery while taking prescription pain medicine.  Wear loose, comfortable clothing.  Cover sensitive areas with a dressing to reduce friction from clothing rubbing on the area.  If directed, put ice on the painful area: ? Put ice in a plastic bag. ? Place a towel between your skin and the bag. ? Leave the ice on for 20 minutes, 2-3 times a day.  Talk to your health care provider if you feel depressed or desperate. Living with long-term pain can be depressing.  Keep all follow-up visits as told by your health care provider. This is important. Contact a health care provider if:  Your medicine is not helping.  You are struggling to manage your pain at home. Summary  Postherpetic neuralgia is a very painful disorder   that can occur after an episode of shingles.  The pain is often severe, burning, electric, or stabbing.  Prescription medicines can be helpful in managing persistent pain.  Getting a vaccination for shingles can prevent PHN. This vaccine is recommended for people older than 60. This information is not intended to replace advice given to you by your health care provider. Make sure  you discuss any questions you have with your health care provider. Document Revised: 10/15/2017 Document Reviewed: 01/19/2017 Elsevier Patient Education  [place].

## 2020-06-17 NOTE — Progress Notes (Signed)
Assessment and Plan:  Briana [name] was seen today for jaw pain and ear pain.  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 Melford Aase and he is in agreement Continue to monitor for progress Reminded that the vesicles can cause chicken pox in a person that is not immune, will follow up 14 days or sooner if needed Advised go to the ER if headache, facial paralysis, changes vision/speech, hearing, imbalance, weakness. -     dexamethasone (DECADRON) 0.5 MG tablet; Take 1 tab 3 x day - 3 days, then 2 x day - 3 days, then 1 tab daily -     pregabalin (LYRICA) 25 MG capsule; Take 3-4 capsules (75-100 mg total) by mouth 3 (three) times daily as needed. -     valACYclovir (VALTREX) 1000 MG tablet; Take 1 tablet (1,000 mg total) by mouth 3 (three) times daily.  Further disposition pending results of labs. Discussed med's effects and SE's.   Over 15 minutes of exam, counseling, chart review, and critical decision making was performed.   Future Appointments  Date Time Provider [place]  06/25/2020  2:00 PM Troy Sine, MD [facility]  07/01/2020  3:30 PM [name], NP [facility] None  08/29/2020  3:45 PM Vicie Mutters, PA-C [facility] None  03/05/2021  3:00 PM Unk Pinto, MD [facility] None    ------------------------------------------------------------------------------------------------------------------   HPI BP (!) 146/80    Pulse 45   Temp (!) 96.8 F (36 C)   Wt 162 lb (73.5 kg)   SpO2 95%   BMI 30.61 kg/m   86 y.o.female with well controlled T2DM, htn, a. Fib on elequis, CHF presents for 3 week follow up on V3 facial shingles oubreak that began 05/24/2020 with burning pain, was evaluated and treatment with valtrex, decadron, gabapentin initiated on Monday 05/27/20. She has ear pain but no other signs of Ramsay Hunt at previous visits, no lesions of eye, no vision changes. Denies tinnitis, otosensitivity, HA, dizziness, facial paralysis, confusion.    She reports over the weekend has had new blisters pop up on lower lip and cheek. She has been taking lyrica 50 mg TID, initially with very good results, but not as well in last few days. She didn't tolerate gabapentin, did do very well with decadron and valtrex.   Has been doing well with decadron, taking gabapentin 200 mg TID with good results. Denies fever/chills, denies HA, dizziness, vision changes. Reports odd taste in mouth she had previously is resolved   She is here with daughter, Briana [name].   She has had shingles x 2, had zostavax 2014  Past Medical History:  Diagnosis Date  . Acute on chronic diastolic (congestive) heart failure ([place]) 11/21/2018  . Cancer ([place])    skin cancer  . DDD (degenerative disc disease)   . Diverticula of colon   . Diverticulitis   . DJD (degenerative joint disease)   . GERD (gastroesophageal reflux disease)    takes ranitidine  . Heart murmur   .  History of hiatal hernia   . History of pneumonia   . Hx of irritable bowel syndrome   . Hyperlipidemia   . Occasional tremors   . Osteoporosis   . Pre-diabetes   . Varicose veins   . Vitamin D deficiency      Allergies  Allergen Reactions  . Accupril [Quinapril Hcl] Cough  . Neosporin [Neomycin-Bacitracin Zn-Polymyx]     unknown  . Prednisone     Agitated and shaky  . Reglan [Metoclopramide] Other (See Comments)    tremors  . Tramadol Nausea And Vomiting  . Adhesive  [Tape] Rash    Current Outpatient Medications on File Prior to Visit  Medication Sig  . acetaminophen (TYLENOL) 500 MG tablet Take 500-1,000 mg by mouth every 6 (six) hours as needed for moderate pain.  Marland Kitchen amiodarone (PACERONE) 200 MG tablet Take 1 tablet (200 mg total) by mouth daily.  . Ascorbic Acid (VITAMIN C) 1000 MG tablet Take 1,000 mg by mouth daily.   . bumetanide (BUMEX) 1 MG tablet Take 1 mg by mouth daily. Take 1mg  Mon,Wed and Fri  . cholecalciferol (VITAMIN D3) 25 MCG (1000 UT) tablet Take 1,000 Units by mouth daily.   Marland Kitchen ELIQUIS 5 MG TABS tablet Take 1 tablet by mouth twice daily  . loratadine (CLARITIN) 10 MG tablet Take 10 mg by mouth daily as needed for allergies.  . Misc Natural Products (OSTEO BI-FLEX JOINT SHIELD PO) Take 1 tablet by mouth 2 (two) times daily.   . nadolol (CORGARD) 20 MG tablet Take 0.5 tablets (10 mg total) by mouth daily. Take 1 tablet Daily for BP & Tremor  . Naphazoline-Pheniramine (OPCON-A) 0.027-0.315 % SOLN Place 1 drop into both eyes daily as needed (allergies).  . olmesartan (BENICAR) 40 MG tablet Take 1 tablet Daily for BP  . PROAIR HFA 108 (90 Base) MCG/ACT inhaler Use 2 Inhalations 15 minutes apart every 4 hours if needed to Rescue Asthma Attack  . promethazine-dextromethorphan (PROMETHAZINE-DM) 6.25-15 MG/5ML syrup Take 5 mLs by mouth 4 (four) times daily as needed for cough.  Marland Kitchen amLODipine (NORVASC) 5 MG tablet Take 1.5 tablets (7.5 mg total) by mouth daily.   No current facility-administered medications on file prior to visit.    ROS: all negative except above.   Physical Exam:  BP (!) 146/80   Pulse 45   Temp (!) 96.8 F (36 C)   Wt 162 lb (73.5 kg)   SpO2 95%   BMI 30.61 kg/m   General Appearance: Well nourished, well dressed elderly female, appears uncomfortable but in no apparent distress. Eyes: PERRLA, EOMs, conjunctiva no swelling or erythema Sinuses: No Frontal/maxillary tenderness ENT/Mouth: Ext aud canals clear, TMs  without erythema, bulging. No auricular, tragal or mastoid erythema or tenderness. No erythema, swelling, or exudate on post pharynx. No lesions in mouth. Tonsils not swollen or erythematous. Hearing normal. Generalized swelling of left law and left lower lip are improved, but with 2 new vesicular lesions, 1 to lower lip and 1 to cheek with erythematous base.  Neck: Supple, thyroid normal.  Respiratory: Respiratory effort normal, BS equal bilaterally without rales, rhonchi, wheezing or stridor.  Cardio: RRR with no MRGs. Brisk peripheral pulses without edema.  Abdomen: Soft, + BS.  Non tender, no guarding, rebound, hernias, masses. Lymphatics: Some non-specific tenderness to left upper neck without lymphadenopathy.  Musculoskeletal: No TMJ tenderness,  normal gait.  Skin: Warm, dry; she has 1 vesicular lesion lower cheek, and to lower lip with erythematous base. Neuro:  Cranial nerves intact. Normal muscle tone, no cerebellar symptoms.   Psych: Awake and oriented X 3, normal affect, Insight and Judgment appropriate.     Izora Ribas, NP 11:26 AM [facility]

## 2020-06-25 ENCOUNTER — Ambulatory Visit: Payer: Medicare Other | Admitting: Cardiovascular Disease

## 2020-06-27 NOTE — Progress Notes (Signed)
Assessment and Plan:  Briana [name] was seen today for jaw pain and ear pain.  Diagnoses and all orders for this visit:  Post herpetic trigeminal neuralgia  Herpes zoster infection of mandibular region Persistent neuralgia of L V2-3 distribution, ear pain  Herpetic lesions appear resolving Fortunately did not have Ramsay Hunt, no ophthalmic involvement Lyrica is better tolerated to gabapentin; 100 mg 3-4 caps per day (high dose with bedtime) and call back with results Consider addition of low dose amitriptyline 10 mg daily if inadequately controlled in a few days  Continue to monitor for progress Advised go to the ER if headache, facial paralysis, changes vision/speech, hearing, imbalance, weakness. -     pregabalin (LYRICA) 100 MG capsule; Take 3-4 capsules (100-200 mg) by mouth 3 (three) times daily as needed.  Further disposition pending results of labs. Discussed med's effects and SE's.   Over 15 minutes of exam, counseling, chart review, and critical decision making was performed.   Future Appointments  Date Time Provider [place]  07/31/2020  3:45 PM Deberah Pelton, NP [facility]  08/29/2020  3:45 PM Vicie Mutters, PA-C [facility] None  03/05/2021  3:00 PM Unk Pinto, MD [facility] None    ------------------------------------------------------------------------------------------------------------------   HPI BP (!) 148/64   Pulse (!) 50   Temp 97.7 F (36.5 C)   Wt 161 lb (73 kg)   SpO2 96%   BMI 30.42 kg/m   86 y.o.female with well controlled T2DM, htn, a. Fib on elequis, CHF presents for 2 week follow up on L trigeminal shingles oubreak that began 05/24/2020 with burning pain, complete treatment with valtrex, decadron, gabapentin initiated on Monday 05/27/20 and again repeated with lyrica (better tolerated) on 06/17/20 due to recurrent outbreak with new lesions. She reports has completed without new lesions.   She has persistent L sided ear and facial  pain. She was doing well with lyrica 75 mg TID initially, but report towards the end of last week was unsure if this was helping as much, then ran out prior to the weekend and has been miserable. She reports has taken some tylenol with mild benefit, has been using ice. Pain at night has been keeping her from resting well.   She is here with daughter, [name].   She has had shingles x 2, had zostavax 2014. Have discussed shingrix and she plans to pursue after 90 days.   Past Medical History:  Diagnosis Date  . Acute on chronic diastolic (congestive) heart failure ([place]) 11/21/2018  . Cancer ([place])    skin cancer  . DDD (degenerative disc disease)   . Diverticula of colon   . Diverticulitis   . DJD (degenerative joint disease)   . GERD (gastroesophageal reflux disease)    takes ranitidine  . Heart murmur   . History of hiatal hernia   . History of pneumonia   . Hx of irritable bowel syndrome   . Hyperlipidemia   . Occasional tremors   . Osteoporosis   . Pre-diabetes   . Varicose veins   . Vitamin D deficiency      Allergies  Allergen Reactions  . Accupril [Quinapril Hcl] Cough  . Neosporin [Neomycin-Bacitracin Zn-Polymyx]     unknown  . Prednisone     Agitated and shaky  . Reglan [Metoclopramide] Other (See Comments)    tremors  . Tramadol Nausea And Vomiting  . Adhesive [Tape] Rash    Current Outpatient Medications on File Prior to Visit  Medication Sig  . acetaminophen (TYLENOL)  500 MG tablet Take 500-1,000 mg by mouth every 6 (six) hours as needed for moderate pain.  Marland Kitchen amiodarone (PACERONE) 200 MG tablet Take 1 tablet (200 mg total) by mouth daily.  . Ascorbic Acid (VITAMIN C) 1000 MG tablet Take 1,000 mg by mouth daily.   . bumetanide (BUMEX) 1 MG tablet Take 1 mg by mouth daily. Take 1mg  Mon,Wed and Fri  . cholecalciferol (VITAMIN D3) 25 MCG (1000 UT) tablet Take 1,000 Units by mouth daily.   Marland Kitchen ELIQUIS 5 MG TABS tablet Take 1 tablet by mouth twice daily  . loratadine  (CLARITIN) 10 MG tablet Take 10 mg by mouth daily as needed for allergies.  . Misc Natural Products (OSTEO BI-FLEX JOINT SHIELD PO) Take 1 tablet by mouth 2 (two) times daily.   . nadolol (CORGARD) 20 MG tablet Take 0.5 tablets (10 mg total) by mouth daily. Take 1 tablet Daily for BP & Tremor  . Naphazoline-Pheniramine (OPCON-A) 0.027-0.315 % SOLN Place 1 drop into both eyes daily as needed (allergies).  . olmesartan (BENICAR) 40 MG tablet Take 1 tablet Daily for BP  . PROAIR HFA 108 (90 Base) MCG/ACT inhaler Use 2 Inhalations 15 minutes apart every 4 hours if needed to Rescue Asthma Attack  . promethazine-dextromethorphan (PROMETHAZINE-DM) 6.25-15 MG/5ML syrup Take 5 mLs by mouth 4 (four) times daily as needed for cough.  Marland Kitchen amLODipine (NORVASC) 5 MG tablet Take 1.5 tablets (7.5 mg total) by mouth daily.  . valACYclovir (VALTREX) 1000 MG tablet Take 1 tablet (1,000 mg total) by mouth 3 (three) times daily. (Patient not taking: Reported on 07/01/2020)   No current facility-administered medications on file prior to visit.    ROS: all negative except above.   Physical Exam:  BP (!) 148/64   Pulse (!) 50   Temp 97.7 F (36.5 C)   Wt 161 lb (73 kg)   SpO2 96%   BMI 30.42 kg/m   General Appearance: Well nourished, well dressed elderly female, appears in pain but in no acute distress. Eyes: PERRLA, EOMs, conjunctiva no swelling or erythema Sinuses: No Frontal/maxillary tenderness ENT/Mouth: Ext aud canals clear, TMs without erythema, bulging. No auricular, tragal or mastoid erythema or tenderness. No erythema, swelling, or exudate on post pharynx. No lesions in mouth. Tonsils not swollen or erythematous. Hearing normal. Resolving lesions to cheek and jaw, preauricular area. Previous edema is resolved.  Neck: Supple, thyroid normal.  Respiratory: Respiratory effort normal, BS equal bilaterally without rales, rhonchi, wheezing or stridor.  Cardio: RRR with no MRGs. Brisk peripheral pulses  without edema.  Abdomen: Soft, + BS.  Non tender, no guarding, rebound, hernias, masses. Lymphatics: Non-tender without lymphadenopathy.  Musculoskeletal: No TMJ tenderness,  normal gait.  Skin: Warm, dry; no vesicles or new rash; has mild areas of residual inflammation to previous outbreak site at cheek, chin, preauricular Neuro: Cranial nerves intact. Normal muscle tone, no cerebellar symptoms.   Psych: Awake and oriented X 3, normal affect, Insight and Judgment appropriate.     Izora Ribas, NP 4:20 PM [facility]

## 2020-06-28 ENCOUNTER — Other Ambulatory Visit: Payer: Self-pay | Admitting: Adult Health

## 2020-07-01 ENCOUNTER — Other Ambulatory Visit: Payer: Self-pay

## 2020-07-01 ENCOUNTER — Ambulatory Visit (INDEPENDENT_AMBULATORY_CARE_PROVIDER_SITE_OTHER): Payer: Medicare Other | Admitting: Adult Health

## 2020-07-01 ENCOUNTER — Encounter: Payer: Self-pay | Admitting: Adult Health

## 2020-07-01 VITALS — BP 148/64 | HR 50 | Temp 97.7°F | Wt 161.0 lb

## 2020-07-01 DIAGNOSIS — B0222 Postherpetic trigeminal neuralgia: Secondary | ICD-10-CM | POA: Diagnosis not present

## 2020-07-01 DIAGNOSIS — E1169 Type 2 diabetes mellitus with other specified complication: Secondary | ICD-10-CM

## 2020-07-01 DIAGNOSIS — B029 Zoster without complications: Secondary | ICD-10-CM

## 2020-07-01 MED ORDER — PREGABALIN 100 MG PO CAPS: ORAL_CAPSULE | ORAL | 0 refills | Status: DC

## 2020-07-01 NOTE — Patient Instructions (Addendum)
[phone] mg of tylenol up to 4 times a day as needed  Or can try going up further on lyrica  Or may benefit from amitriptyline or cymbalta    Postherpetic Neuralgia Postherpetic neuralgia (PHN) is nerve pain that occurs after a shingles infection. Shingles is a painful rash that appears on one area of the body, usually on the trunk or face. Shingles is caused by the varicella-zoster virus. This is the same virus that causes chickenpox. In people who have had chickenpox, the virus can resurface years later and cause shingles. You may have PHN if you continue to have pain for 4 months after your shingles rash has gone away. PHN appears in the same area where you had the shingles rash. The pain usually goes away after the rash disappears. Getting a vaccination for shingles can prevent PHN. This vaccine is recommended for people older than 60. It may prevent shingles, and may also lower your risk of PHN if you do get shingles. What are the causes? This condition is caused by damage to your nerves from the varicella-zoster virus. The damage makes your nerves overly sensitive. What increases the risk? The following factors may make you more likely to develop this condition:  Being older than 84 years of age.  Having severe pain before your shingles rash starts.  Having a severe rash.  Having shingles in and around the eye area.  Having a disease that makes your body unable to fight infections (weak immune system). What are the signs or symptoms? The main symptom of this condition is pain. The pain may:  Often be very bad and may be described as stabbing, burning, or feeling like an electric shock.  Come and go or may be there all the time.  Be triggered by light touches on the skin or changes in temperature. You may have itching along with the pain. How is this diagnosed? This condition may be diagnosed based on your symptoms and your history of shingles. Lab studies and other diagnostic  tests are usually not needed. How is this treated? There is no cure for this condition. Treatment for PHN will focus on pain relief. Over-the-counter pain relievers do not usually relieve PHN pain. You may need to work with a pain specialist. Treatment may include:  Antidepressant medicines to help with pain and improve sleep.  Anti-seizure medicines to relieve nerve pain.  Strong pain relievers (opioids).  A numbing patch worn on the skin (lidocaine patch).  Botox (botulinum toxin) injections to block pain signals between nerves and muscles.  Injections of numbing medicine or anti-inflammatory medicines around irritated nerves. Follow these instructions at home:   It may take a long time to recover from PHN. Work closely with your health care provider and develop a good support system at home.  Take over-the-counter and prescription medicines only as told by your health care provider.  Do not drive or use heavy machinery while taking prescription pain medicine.  Wear loose, comfortable clothing.  Cover sensitive areas with a dressing to reduce friction from clothing rubbing on the area.  If directed, put ice on the painful area: ? Put ice in a plastic bag. ? Place a towel between your skin and the bag. ? Leave the ice on for 20 minutes, 2-3 times a day.  Talk to your health care provider if you feel depressed or desperate. Living with long-term pain can be depressing.  Keep all follow-up visits as told by your health care provider. This is  important. Contact a health care provider if:  Your medicine is not helping.  You are struggling to manage your pain at home. Summary  Postherpetic neuralgia is a very painful disorder that can occur after an episode of shingles.  The pain is often severe, burning, electric, or stabbing.  Prescription medicines can be helpful in managing persistent pain.  Getting a vaccination for shingles can prevent PHN. This vaccine is  recommended for people older than 60. This information is not intended to replace advice given to you by your health care provider. Make sure you discuss any questions you have with your health care provider. Document Revised: 10/15/2017 Document Reviewed: 01/19/2017 Elsevier Patient Education  [place].       Pregabalin capsules What is this medicine? PREGABALIN (pre GAB a lin) is used to treat nerve pain from diabetes, shingles, spinal cord injury, and fibromyalgia. It is also used to control seizures in epilepsy. This medicine may be used for other purposes; ask your health care provider or pharmacist if you have questions. COMMON BRAND NAME(S): Lyrica What should I tell my health care provider before I take this medicine? They need to know if you have any of these conditions:  heart disease  history of drug abuse or alcohol abuse problem  kidney disease  lung or breathing disease  suicidal thoughts, plans, or attempt; a previous suicide attempt by you or a family member  an unusual or allergic reaction to pregabalin, gabapentin, other medicines, foods, dyes, or preservatives  pregnant or trying to get pregnant  breast-feeding How should I use this medicine? Take this medicine by mouth with a glass of water. Follow the directions on the prescription label. You can take it with or without food. If it upsets your stomach, take it with food. Take your medicine at regular intervals. Do not take it more often than directed. Do not stop taking except on your doctor's advice. A special MedGuide will be given to you by the pharmacist with each prescription and refill. Be sure to read this information carefully each time. Talk to your pediatrician regarding the use of this medicine in children. While this drug may be prescribed for children as young as 1 month for selected conditions, precautions do apply. Overdosage: If you think you have taken too much of this medicine  contact a poison control center or emergency room at once. NOTE: This medicine is only for you. Do not share this medicine with others. What if I miss a dose? If you miss a dose, take it as soon as you can. If it is almost time for your next dose, take only that dose. Do not take double or extra doses. What may interact with this medicine? This medicine may interact with the following medications:  alcohol  antihistamines for allergy, cough, and cold  certain medicines for anxiety or sleep  certain medicines for depression like amitriptyline, fluoxetine, sertraline  certain medicines for diabetes  certain medicines for seizures like phenobarbital, primidone  general anesthetics like halothane, isoflurane, methoxyflurane, propofol  local anesthetics like lidocaine, pramoxine, tetracaine  medicines that relax muscles for surgery  narcotic medicines for pain  phenothiazines like chlorpromazine, mesoridazine, prochlorperazine, thioridazine This list may not describe all possible interactions. Give your health care provider a list of all the medicines, herbs, non-prescription drugs, or dietary supplements you use. Also tell them if you smoke, drink alcohol, or use illegal drugs. Some items may interact with your medicine. What should I watch for while  using this medicine? Tell your doctor or healthcare professional if your symptoms do not start to get better or if they get worse. Visit your doctor or health care professional for regular checks on your progress. Do not stop taking except on your doctor's advice. You may develop a severe reaction. Your doctor will tell you how much medicine to take. Wear a medical identification bracelet or chain if you are taking this medicine for seizures, and carry a card that describes your disease and details of your medicine and dosage times. You may get drowsy or dizzy. Do not drive, use machinery, or do anything that needs mental alertness until you  know how this medicine affects you. Do not stand or sit up quickly, especially if you are an older patient. This reduces the risk of dizzy or fainting spells. Alcohol may interfere with the effect of this medicine. Avoid alcoholic drinks. If you have a heart condition, like congestive heart failure, and notice that you are retaining water and have swelling in your hands or feet, contact your health care provider immediately. The use of this medicine may increase the chance of suicidal thoughts or actions. Pay special attention to how you are responding while on this medicine. Any worsening of mood, or thoughts of suicide or dying should be reported to your health care professional right away. This medicine has caused reduced sperm counts in some men. This may interfere with the ability to father a child. You should talk to your doctor or health care professional if you are concerned about your fertility. Women who become pregnant while using this medicine for seizures may enroll in the [place] Pregnancy Registry by calling [phone]. This registry collects information about the safety of antiepileptic drug use during pregnancy. What side effects may I notice from receiving this medicine? Side effects that you should report to your doctor or health care professional as soon as possible:  allergic reactions like skin rash, itching or hives, swelling of the face, lips, or tongue  breathing problems  changes in vision  chest pain  confusion  jerking or unusual movements of any part of your body  loss of memory  muscle pain, tenderness, or weakness  suicidal thoughts or other mood changes  swelling of the ankles, feet, hands  unusual bruising or bleeding Side effects that usually do not require medical attention (report to your doctor or health care professional if they continue or are bothersome):  dizziness  drowsiness  dry  mouth  headache  nausea  tremors  trouble sleeping  weight gain This list may not describe all possible side effects. Call your doctor for medical advice about side effects. You may report side effects to FDA at 1-800-FDA-1088. Where should I keep my medicine? Keep out of the reach of children. This medicine can be abused. Keep your medicine in a safe place to protect it from theft. Do not share this medicine with anyone. Selling or giving away this medicine is dangerous and against the law. This medicine may cause accidental overdose and death if it taken by other adults, children, or pets. Mix any unused medicine with a substance like cat litter or coffee grounds. Then throw the medicine away in a sealed container like a sealed bag or a coffee can with a lid. Do not use the medicine after the expiration date. Store at room temperature between 15 and 30 degrees C (59 and 86 degrees F). NOTE: This sheet is a summary. It may  not cover all possible information. If you have questions about this medicine, talk to your doctor, pharmacist, or health care provider.  2020 Elsevier/Gold Standard (2018-11-04 13:15:55)

## 2020-07-03 ENCOUNTER — Telehealth: Payer: Self-pay

## 2020-07-03 ENCOUNTER — Other Ambulatory Visit: Payer: Self-pay | Admitting: Adult Health

## 2020-07-03 MED ORDER — AMITRIPTYLINE HCL 10 MG PO TABS: 10.0000 mg | ORAL_TABLET | Freq: Every day | ORAL | 1 refills | Status: DC

## 2020-07-03 NOTE — Telephone Encounter (Signed)
Patient and daughter aware of starting the Amitriptyline and are receptive to that. Patient states that even with the Lyrica 200mg  QHS, wakes up groggy and seems to bother her worse in the morning. Doesn't really start to feel better until the afternoon. Any other suggestions at this time. I did tell her to try the Amitriptyline to see how she does with that first.

## 2020-07-03 NOTE — Telephone Encounter (Signed)
Patient's daughter states that her mother is still in a lot of pain, was told to call back if this was still happening.

## 2020-07-05 DIAGNOSIS — S52502A Unspecified fracture of the lower end of left radius, initial encounter for closed fracture: Secondary | ICD-10-CM | POA: Diagnosis not present

## 2020-07-08 ENCOUNTER — Telehealth: Payer: Self-pay | Admitting: *Deleted

## 2020-07-08 NOTE — Telephone Encounter (Signed)
   [facility] Pre-operative Risk Assessment    HEARTCARE STAFF: - Please ensure there is not already an duplicate clearance open for this procedure. - Under Visit Info/Reason for Call, type in Other and utilize the format Clearance MM/DD/YY or Clearance TBD. Do not use dashes or single digits. - If request is for dental extraction, please clarify the # of teeth to be extracted.  Request for surgical clearance:  1. What type of surgery is being performed? LEFT DISTAL RADIUS ORIF   2. When is this surgery scheduled? 07/10/20  3. What type of clearance is required (medical clearance vs. Pharmacy clearance to hold med vs. Both)? BOTH  4. Are there any medications that need to be held prior to surgery and how long?ELIQUIS    5. Practice name and name of physician performing surgery?[facility]  6. What is the office phone number? [phone]   7.   What is the office fax number? [place] ;[email or web address]   8.   Anesthesia type (None, local, MAC, general) ? REGIONAL BLOCK WITH IV SEDATION    Raiford Simmonds 07/08/2020, 12:31 PM  _________________________________________________________________   (provider comments below)

## 2020-07-08 NOTE — Telephone Encounter (Signed)
   Primary Cardiologist: Shelva Majestic, MD  Chart reviewed and patient contacted by phone today as part of pre-operative protocol coverage. Given past medical history and time since last visit, based on ACC/AHA guidelines, Briana [name] would be at acceptable risk for the planned procedure without further cardiovascular testing.   OK to hold Eliquis 2-3 days pre op, resume when safe post op.   I will route this recommendation to the requesting party via Epic fax function and remove from pre-op pool.  Please call with questions.  Kerin Ransom, PA-C 07/08/2020, 3:06 PM

## 2020-07-08 NOTE — Telephone Encounter (Signed)
Patient with diagnosis of afib on Eliquis for anticoagulation.    Procedure: left distal radius ORIF Date of procedure: 07/10/20  CHADS2-VASc score of 5 (age x2, sex, CHF, HTN), also has pre-diabetes.  CrCl 45mL/min Platelet count 197K  Per office protocol, patient can hold Eliquis for 2-3 days prior to procedure.

## 2020-07-08 NOTE — Telephone Encounter (Signed)
Please comment on holding anticoagulation - surgery is 8/25.  Kerin Ransom PA-C 07/08/2020 2:37 PM

## 2020-07-10 DIAGNOSIS — G8918 Other acute postprocedural pain: Secondary | ICD-10-CM | POA: Diagnosis not present

## 2020-07-10 DIAGNOSIS — S52572A Other intraarticular fracture of lower end of left radius, initial encounter for closed fracture: Secondary | ICD-10-CM | POA: Diagnosis not present

## 2020-07-23 DIAGNOSIS — S52502A Unspecified fracture of the lower end of left radius, initial encounter for closed fracture: Secondary | ICD-10-CM | POA: Diagnosis not present

## 2020-07-30 NOTE — Progress Notes (Signed)
Cardiology Clinic Note   Patient Name: Briana [name] Date of Encounter: 07/31/2020  Primary Care Provider:  Unk Pinto, MD Primary Cardiologist:  Shelva Majestic, MD  Patient Profile    Briana [name] 86 year old female presents today for follow-up of her hypertension  Past Medical History    Past Medical History:  Diagnosis Date  . Acute on chronic diastolic (congestive) heart failure ([place]) 11/21/2018  . Cancer ([place])    skin cancer  . DDD (degenerative disc disease)   . Diverticula of colon   . Diverticulitis   . DJD (degenerative joint disease)   . GERD (gastroesophageal reflux disease)    takes ranitidine  . Heart murmur   . History of hiatal hernia   . History of pneumonia   . Hx of irritable bowel syndrome   . Hyperlipidemia   . Occasional tremors   . Osteoporosis   . Pre-diabetes   . Varicose veins   . Vitamin D deficiency    Past Surgical History:  Procedure Laterality Date  . APPENDECTOMY    . BREAST EXCISIONAL BIOPSY Right   . BREAST SURGERY Right    fluid drained from breast  . CARDIOVERSION N/A 12/20/2018   Procedure: CARDIOVERSION;  Surgeon: Buford Dresser, MD;  Location: [facility] ENDOSCOPY;  Service: Cardiovascular;  Laterality: N/A;  . CARDIOVERSION N/A 06/16/2019   Procedure: CARDIOVERSION;  Surgeon: Fay Records, MD;  Location: [place];  Service: Cardiovascular;  Laterality: N/A;  . COLONOSCOPY    . ESOPHAGOGASTRODUODENOSCOPY    . JOINT REPLACEMENT Left    left hip  . LUMBAR LAMINECTOMY/DECOMPRESSION MICRODISCECTOMY N/A 06/05/2015   Procedure: L3-L5 DECOMPRESSION ;  Surgeon: Melina Schools, MD;  Location: [place];  Service: Orthopedics;  Laterality: N/A;  . OPEN REDUCTION INTERNAL FIXATION (ORIF) DISTAL RADIAL FRACTURE Right 01/10/2014   Procedure: OPEN REDUCTION INTERNAL FIXATION (ORIF) DISTAL RADIAL FRACTURE;  Surgeon: Linna Hoff, MD;  Location: [place];  Service: Orthopedics;  Laterality: Right;  . SPINAL CORD DECOMPRESSION  06/05/2015    L3 L 5  . THYROID SURGERY    . TONSILLECTOMY    . TUBAL LIGATION    . varicose veins stripped      Allergies  Allergies  Allergen Reactions  . Accupril [Quinapril Hcl] Cough  . Neosporin [Neomycin-Bacitracin Zn-Polymyx]     unknown  . Prednisone     Agitated and shaky  . Reglan [Metoclopramide] Other (See Comments)    tremors  . Tramadol Nausea And Vomiting  . Adhesive [Tape] Rash    History of Present Illness    [name] has a past medical history of essential hypertension, chronic diastolic heart failure, GERD, bilateral sensorineural hearing loss,CKD stage III,hyperlipidemia,vitamin D deficiency,obesity,atrial fibrillation diagnosed January 2020 and lumbar stenosis.  She underwent cardioversion on 12/20/2018 and 06/16/2019. With her second cardioversion she received 1 shock with 200 J. She became bradycardic and her atenolol was stopped. On 06/2019 she was found to be hypertensive and her atenolol was restarted. An echocardiogram on 11/22/2018 showed an EF of 60 to 65%. Her initial atrial fibrillation was believed to be brought on by a bout of acute congestive heart failure she was diuresed and discharged home on Bumex, placed on Eliquis for 3 days and underwent her first cardioversion which was on 12/20/2018.  She was last seen by me on 09/12/2019. During that time she was hypertensive with blood pressures in the 419Q and 150ssystolic.Her amlodipine 5 mg was changed to BID.She was encouraged to follow a low  sodium diet.  She presents to the clinic today and states her blood pressure has been better managed at homewithsystolic pressures in the 130's and 140's.She feels well and continues to walk regularly at her local church3 or more times per week.She states she has been trying to follow her low-sodium diet but occasionally uses "No Salt".  She was last seen by Dr. Claiborne Billings on 12/29/2019. During that time she was having labile hypertension with SBP ranging from  140-170. She denied PND orthopnea, and bleeding. Her atenolol was reduced to 12.5 mg, amiodarone was continued 200 mg, losartan was increased to 75, and amlodipine 7.5 mg. She was continued on her Eliquis but no longer on aspirin due to no history of CAD.  She presented to the clinic 02/26/20 for follow-up and statedshe had felt somewhat sluggish since starting the nadolol. Her heart rate was 46. According to her records it has been in the high to mid 40s since starting the medication. She continued to try to walk 3 or more days per week and was eating a low-sodium diet. I  decreased her nadolol to 10 mg and increase her losartan to 75 mg. I planned follow-up for 1 month and  a BMP drawn at that time.  She presented to the clinic 03/27/2020 for follow-up evaluation and stated she continued to walk regularly at the track by her house.  She stated that she used to like to run and would like to know if she still may be able to do light running occasionally.  She stated that she occasionally used salt substitute on her eggs.  After increasing her losartan her PCP discontinued the medication and started her on olmesartan 40 mg daily.  Her blood pressure has been fairly well controlled at home.  Her BMP shows her creatinine is back to baseline.  I  continued her current medication regimen, gave her salty 6 sheet and planned her follow-up with Dr. Claiborne Billings in 3 to 4 months.  Cardiology office received a request for evaluation and recommendations on holding her Eliquis for left distal radius ORIF planned for 07/10/2020.  Recommendations were given to hold Eliquis 2-3 days prior to the procedure.  She presents to the clinic today for follow-up evaluation states she has had a rough last couple of months.  She had a cousin passed away with Covid.  She had bronchitis then developed shingles and had a fall which resulted in a broken left wrist.  She underwent ORIF left wrist with plate and 12 screws.  She presents  to the clinic today with a cast on and her daughter [name].  She states she hopes her cast will be removed in the next week.  States she had a difficult time with her pain medication which caused her to be unstable and affected her mentation.  Her blood pressures have been well controlled at home.  I will have her continue the salty 6 diet and follow-up in 6 months or as needed.  Today shedenies chest pain, shortness of breath, lower extremity edema, palpitations, melena, hematuria, hemoptysis, diaphoresis, weakness, presyncope, syncope, orthopnea, and PND.  Home Medications    Prior to Admission medications   Medication Sig Start Date End Date Taking? Authorizing Provider  acetaminophen (TYLENOL) 500 MG tablet Take 500-1,000 mg by mouth every 6 (six) hours as needed for moderate pain.    [provider]  amiodarone (PACERONE) 200 MG tablet Take 1 tablet (200 mg total) by mouth daily. 01/12/20   Troy Sine,  MD  amitriptyline (ELAVIL) 10 MG tablet Take 1 tablet (10 mg total) by mouth at bedtime. For post shingles nerve pain. 07/03/20   [name], NP  amLODipine (NORVASC) 5 MG tablet Take 1.5 tablets (7.5 mg total) by mouth daily. 01/12/20 05/11/20  Troy Sine, MD  Ascorbic Acid (VITAMIN C) 1000 MG tablet Take 1,000 mg by mouth daily.     [provider]  bumetanide (BUMEX) 1 MG tablet Take 1 mg by mouth daily. Take 1mg  Mon,Wed and Fri    [provider]  cholecalciferol (VITAMIN D3) 25 MCG (1000 UT) tablet Take 1,000 Units by mouth daily.     [provider]  ELIQUIS 5 MG TABS tablet Take 1 tablet by mouth twice daily 02/15/20   Troy Sine, MD  loratadine (CLARITIN) 10 MG tablet Take 10 mg by mouth daily as needed for allergies.    [provider]  Misc Natural Products (OSTEO BI-FLEX JOINT SHIELD PO) Take 1 tablet by mouth 2 (two) times daily.     [provider]  nadolol (CORGARD) 20 MG tablet Take 0.5 tablets (10 mg total) by  mouth daily. Take 1 tablet Daily for BP & Tremor 02/26/20   Deberah Pelton, NP  Naphazoline-Pheniramine (OPCON-A) 0.027-0.315 % SOLN Place 1 drop into both eyes daily as needed (allergies).    [provider]  olmesartan (BENICAR) 40 MG tablet Take 1 tablet Daily for BP 02/28/20   Unk Pinto, MD  pregabalin (LYRICA) 100 MG capsule Take 1 cap with breakfast and mid afternoon, take 1-2 cap in the evening prior to bedtime 07/01/20   [name], NP  PROAIR HFA 108 [phone] Base) MCG/ACT inhaler Use 2 Inhalations 15 minutes apart every 4 hours if needed to Rescue Asthma Attack 04/24/20   Unk Pinto, MD  promethazine-dextromethorphan (PROMETHAZINE-DM) 6.25-15 MG/5ML syrup Take 5 mLs by mouth 4 (four) times daily as needed for cough. 04/29/20   [name], NP  valACYclovir (VALTREX) 1000 MG tablet Take 1 tablet (1,000 mg total) by mouth 3 (three) times daily. Patient not taking: Reported on 07/01/2020 06/17/20   [name], NP    Family History    Family History  Problem Relation Age of Onset  . Hypertension Mother   . Hyperlipidemia Mother   . Heart disease Sister   . Cancer Brother        prostate   She indicated that her mother is deceased. She indicated that her father is deceased. She indicated that her sister is deceased. She indicated that her brother is alive.  Social History    Social History   Socioeconomic History  . Marital status: Widowed    Spouse name: Not on file  . Number of children: Not on file  . Years of education: Not on file  . Highest education level: Not on file  Occupational History  . Not on file  Tobacco Use  . Smoking status: Never Smoker  . Smokeless tobacco: Never Used  Substance and Sexual Activity  . Alcohol use: No  . Drug use: No  . Sexual activity: Not on file  Other Topics Concern  . Not on file  Social History Narrative  . Not on file   Social Determinants of Health   Financial Resource Strain:   . Difficulty of  Paying Living Expenses: Not on file  Food Insecurity:   . Worried About Charity fundraiser in the Last Year: Not on file  . Ran Out of  Food in the Last Year: Not on file  Transportation Needs:   . Lack of Transportation (Medical): Not on file  . Lack of Transportation (Non-Medical): Not on file  Physical Activity:   . Days of Exercise per Week: Not on file  . Minutes of Exercise per Session: Not on file  Stress:   . Feeling of Stress : Not on file  Social Connections:   . Frequency of Communication with Friends and Family: Not on file  . Frequency of Social Gatherings with Friends and Family: Not on file  . Attends Religious Services: Not on file  . Active Member of Clubs or Organizations: Not on file  . Attends Archivist Meetings: Not on file  . Marital Status: Not on file  Intimate Partner Violence:   . Fear of Current or Ex-Partner: Not on file  . Emotionally Abused: Not on file  . Physically Abused: Not on file  . Sexually Abused: Not on file     Review of Systems    General:  No chills, fever, night sweats or weight changes.  Cardiovascular:  No chest pain, dyspnea on exertion, edema, orthopnea, palpitations, paroxysmal nocturnal dyspnea. Dermatological: No rash, lesions/masses Respiratory: No cough, dyspnea Urologic: No hematuria, dysuria Abdominal:   No nausea, vomiting, diarrhea, bright red blood per rectum, melena, or hematemesis Neurologic:  No visual changes, wkns, changes in mental status. All other systems reviewed and are otherwise negative except as noted above.  Physical Exam    VS:  BP 128/64   Pulse (!) 58   Ht 5\' 1"  (1.549 m)   Wt 157 lb 6.4 oz (71.4 kg)   SpO2 95%   BMI 29.74 kg/m  , BMI Body mass index is 29.74 kg/m. GEN: Well nourished, well developed, in no acute distress. HEENT: normal. Neck: Supple, no JVD, carotid bruits, or masses. Cardiac: RRR, no murmurs, rubs, or gallops. No clubbing, cyanosis, edema.  Radials/DP/PT 2+ and  equal bilaterally.  Respiratory:  Respirations regular and unlabored, clear to auscultation bilaterally. GI: Soft, nontender, nondistended, BS + x 4. MS: no deformity or atrophy.  Left arm in cast. Skin: warm and dry, no rash. Neuro:  Strength and sensation are intact. Psych: Normal affect.  Accessory Clinical Findings    Recent Labs: 02/28/2020: ALT 15; Hemoglobin 13.2; Magnesium 2.3; Platelets 197; TSH 1.13 03/25/2020: BUN 20; Creatinine, Ser 0.99; Potassium 4.7; Sodium 143   Recent Lipid Panel    Component Value Date/Time   CHOL 202 (H) 02/28/2020 1459   TRIG 67 02/28/2020 1459   HDL 67 02/28/2020 1459   CHOLHDL 3.0 02/28/2020 1459   VLDL 15 05/04/2017 1339   LDLCALC 119 (H) 02/28/2020 1459    ECG personally reviewed by me today-none today.  EKG 12/29/2019 Sinus bradycardia left axis deviation incomplete right bundle branch block anterior infarct undetermined age 41 bpm  EKG 09/28/2019 Sinus bradycardia left axis deviation incomplete right bundle branch block 52 bpm  Echocardiogram 11/22/2018: Study Conclusions  - Procedure narrative: Transthoracic echocardiography. Image quality was adequate. The study was technically difficult. - Left ventricle: The cavity size was normal. Wall thickness was increased in a pattern of mild LVH. Systolic function was normal. The estimated ejection fraction was in the range of 60% to 65%. Wall motion was normal; there were no regional wall motion abnormalities. The study was not technically sufficient to allow evaluation of LV diastolic dysfunction due to atrial fibrillation. - Aortic valve: There was trivial regurgitation directed eccentrically in the LVOT and  towards the mitral anterior leaflet. - Left atrium: The atrium was mildly dilated. - Pulmonic valve: There was trivial regurgitation. - Pericardium, extracardiac: A trivial, free-flowing pericardial effusion was identified circumferential to the heart.   Assessment & Plan   1.  Essential hypertension-blood pressure [id number]. Blood pressure 144Y 185U systolic at home. Heart rate today 58 bpm Continuenadolol,Olmesartan  Heart healthy low-sodium diet-reviewed about no salt substitute. Maintain physical activity  Persistent atrial fibrillation-heart rate today 58 bpm.  Successfulcardioversionon 06/16/2019 with 1 shock at 200 J. Continue amiodarone, apixaban, nadolol, ContinueBumex 1 tablet every other day Monday, Wednesday, Friday, Saturday-instructed to call office with 3 pounds overnight or 5 pounds in 1 week weight gain. Maintain physical activity as tolerated Low-sodium heart healthy diet CHA2DS2/VAS Stroke Risk Points6 high risk (chf,htn,age, diabetes, female) Instructed to avoid caffeinated beverages and chocolate  Chronic diastolic congestive heart failure-euvolemic today.  No increased work of breathing. Echocardiogram 01/14/4969 diastolic dysfunction due to atrial fibrillationLVEF 60 to 65%. Nolower extremityedema today.Weight today 161pounds up from 156poundson 09/12/2019. ContinuetoBumex 1 tablet every other day Monday, Wednesday, Friday, Saturday-instructed to call office with 3 pounds overnight or 5 pounds in 1 week weight gain.  Heart healthy low-sodium diet Maintain physical activity  Disposition: Follow-up with Dr. Claiborne Billings or me in 6 months.   Jossie Ng. Cleaver NP-C    07/31/2020, 4:33 PM [facility] [place] [address] Office [phone] Fax [phone]  Notice: This dictation was prepared with Dragon dictation along with smaller phrase technology. Any transcriptional errors that result from this process are unintentional and may not be corrected upon review.

## 2020-07-31 ENCOUNTER — Other Ambulatory Visit: Payer: Self-pay

## 2020-07-31 ENCOUNTER — Ambulatory Visit: Payer: Medicare Other | Admitting: General Practice

## 2020-07-31 ENCOUNTER — Encounter: Payer: Self-pay | Admitting: General Practice

## 2020-07-31 VITALS — BP 128/64 | HR 58 | Ht 61.0 in | Wt 157.4 lb

## 2020-07-31 DIAGNOSIS — I5032 Chronic diastolic (congestive) heart failure: Secondary | ICD-10-CM | POA: Diagnosis not present

## 2020-07-31 DIAGNOSIS — I4819 Other persistent atrial fibrillation: Secondary | ICD-10-CM

## 2020-07-31 DIAGNOSIS — I10 Essential (primary) hypertension: Secondary | ICD-10-CM | POA: Diagnosis not present

## 2020-07-31 NOTE — Patient Instructions (Signed)
Medication Instructions:  The current medical regimen is effective;  continue present plan and medications as directed. Please refer to the Current Medication list given to you today. *If you need a refill on your cardiac medications before your next appointment, please call your pharmacy*  Lab Work: NONE  Special Instructions PLEASE READ AND FOLLOW SALTY 6-ATTACHED  PLEASE INCREASE PHYSICAL ACTIVITY AS TOLERATED  Follow-Up: Your next appointment:  6 month(s) In Person with Shelva Majestic, MD -[place], FNP-C   At [facility], you and your health needs are our priority.  As part of our continuing mission to provide you with exceptional heart care, we have created designated Provider Care Teams.  These Care Teams include your primary Cardiologist (physician) and Advanced Practice Providers (APPs -  Physician Assistants and Nurse Practitioners) who all work together to provide you with the care you need, when you need it.  We recommend signing up for the patient portal called "MyChart".  Sign up information is provided on this After Visit Summary.  MyChart is used to connect with patients for Virtual Visits (Telemedicine).  Patients are able to view lab/test results, encounter notes, upcoming appointments, etc.  Non-urgent messages can be sent to your provider as well.   To learn more about what you can do with MyChart, go to [email or web address].

## 2020-08-13 DIAGNOSIS — S52502A Unspecified fracture of the lower end of left radius, initial encounter for closed fracture: Secondary | ICD-10-CM | POA: Diagnosis not present

## 2020-08-13 DIAGNOSIS — M25632 Stiffness of left wrist, not elsewhere classified: Secondary | ICD-10-CM | POA: Diagnosis not present

## 2020-08-15 ENCOUNTER — Telehealth: Payer: Self-pay | Admitting: Cardiovascular Disease

## 2020-08-16 NOTE — Telephone Encounter (Signed)
Prescription refill request for Eliquis received. Indication:  Atrial Fibrillation Last office visit: 07/2020 Briana [name] Scr: 0.99 03/2020 Age: 86 Weight: 71.4 kg  Prescription refilled

## 2020-08-19 ENCOUNTER — Other Ambulatory Visit: Payer: Self-pay

## 2020-08-19 MED ORDER — APIXABAN 5 MG PO TABS
5.0000 mg | ORAL_TABLET | Freq: Two times a day (BID) | ORAL | 1 refills | Status: DC
Start: 2020-08-19 — End: 2021-01-28

## 2020-08-19 NOTE — Progress Notes (Signed)
Eliquis refill request. Pharmacy never received prescription.

## 2020-08-21 DIAGNOSIS — M25632 Stiffness of left wrist, not elsewhere classified: Secondary | ICD-10-CM | POA: Diagnosis not present

## 2020-08-29 ENCOUNTER — Ambulatory Visit: Payer: Medicare Other | Admitting: Adult Health Nurse Practitioner

## 2020-09-04 DIAGNOSIS — M25632 Stiffness of left wrist, not elsewhere classified: Secondary | ICD-10-CM | POA: Diagnosis not present

## 2020-09-10 ENCOUNTER — Encounter: Payer: Self-pay | Admitting: Adult Health Nurse Practitioner

## 2020-09-10 ENCOUNTER — Other Ambulatory Visit: Payer: Self-pay

## 2020-09-10 ENCOUNTER — Ambulatory Visit (INDEPENDENT_AMBULATORY_CARE_PROVIDER_SITE_OTHER): Payer: Medicare Other | Admitting: Adult Health Nurse Practitioner

## 2020-09-10 VITALS — BP 130/72 | HR 64 | Temp 98.1°F | Wt 157.6 lb

## 2020-09-10 DIAGNOSIS — D6869 Other thrombophilia: Secondary | ICD-10-CM

## 2020-09-10 DIAGNOSIS — B0222 Postherpetic trigeminal neuralgia: Secondary | ICD-10-CM

## 2020-09-10 DIAGNOSIS — I10 Essential (primary) hypertension: Secondary | ICD-10-CM | POA: Diagnosis not present

## 2020-09-10 DIAGNOSIS — Z23 Encounter for immunization: Secondary | ICD-10-CM | POA: Diagnosis not present

## 2020-09-10 DIAGNOSIS — I5032 Chronic diastolic (congestive) heart failure: Secondary | ICD-10-CM

## 2020-09-10 DIAGNOSIS — E1122 Type 2 diabetes mellitus with diabetic chronic kidney disease: Secondary | ICD-10-CM

## 2020-09-10 DIAGNOSIS — N183 Chronic kidney disease, stage 3 unspecified: Secondary | ICD-10-CM

## 2020-09-10 DIAGNOSIS — Z6829 Body mass index (BMI) 29.0-29.9, adult: Secondary | ICD-10-CM

## 2020-09-10 DIAGNOSIS — B029 Zoster without complications: Secondary | ICD-10-CM

## 2020-09-10 DIAGNOSIS — E559 Vitamin D deficiency, unspecified: Secondary | ICD-10-CM | POA: Diagnosis not present

## 2020-09-10 DIAGNOSIS — N1831 Chronic kidney disease, stage 3a: Secondary | ICD-10-CM

## 2020-09-10 DIAGNOSIS — E1169 Type 2 diabetes mellitus with other specified complication: Secondary | ICD-10-CM

## 2020-09-10 DIAGNOSIS — E785 Hyperlipidemia, unspecified: Secondary | ICD-10-CM

## 2020-09-10 DIAGNOSIS — I480 Paroxysmal atrial fibrillation: Secondary | ICD-10-CM

## 2020-09-10 DIAGNOSIS — Z79899 Other long term (current) drug therapy: Secondary | ICD-10-CM

## 2020-09-10 NOTE — Patient Instructions (Signed)
You received the High Dose Influenza vaccination today.  We will call you with your lab results from today in 1-3 days.   Below is information regarding Shingrix vaccination.  You would get this after resolution of your symptoms, then 2 months after that.    You are due for the Shingrix vaccination.  You may get this an your local pharmacy.  Contact them for available to this.  There may be a wait list.  Be low is some information about this vaccination from [facility], [place].  Shingrix, which was approved by the FDA in October 2017. Doses are given two to six months apart. Shingrix is said to be more than 90% effective against shingles and postherpetic neuralgia -- a painful nerve condition that can arise as a shingles complication.  Zostavax has been used since 2006 and has been reported to reduce the risk of shingles by 51%. Research has also shown that Zostavax loses its ability to prevent shingles after five years. If you've ever had chickenpox, you are at risk for shingles, which is essentially a re-emergence of the virus that caused your chickenpox. The CDC recommends to get the Shingrix vaccination even if you aren't sure you've had chickenpox and if you've already had shingles. Although it's uncommon, you can get shingles more than once. In addition, you should get the Shingrix vaccine even if you already got the Zostavax vaccine.  The wait time between these vaccinations is eight weeks.  Who Should Get Shingrix? Healthy adults 50 years and older should get two doses of Shingrix, separated by 2 to 6 months. You should get Shingrix even if in the past you . had shingles  . received Zostavax  . are not sure if you had chickenpox There is no maximum age for getting Shingrix. If you had shingles in the past, you can get Shingrix to help prevent future occurrences of the disease. There is no specific length of time that you need to wait after having  shingles before you can receive Shingrix, but generally you should make sure the shingles rash has gone away before getting vaccinated. You can get Shingrix whether or not you remember having had chickenpox in the past. Studies show that more than 99% of Americans 40 years and older have had chickenpox, even if they don't remember having the disease. Chickenpox and shingles are related because they are caused by the same virus (varicella zoster virus). After a person recovers from chickenpox, the virus stays dormant (inactive) in the body. It can reactivate years later and cause shingles. If you had Zostavax in the recent past, you should wait at least eight weeks before getting Shingrix. Talk to your healthcare provider to determine the best time to get Shingrix. Shingrix is available in [employer] and pharmacies. To find doctor's offices or pharmacies near you that offer the vaccine, visit [facility] Vaccine FinderExternal. If you have questions about Shingrix, talk with your healthcare provider. Vaccine for Those 60 Years and Older  Shingrix reduces the risk of shingles and PHN by more than 90% in people 86 and older. CDC recommends the vaccine for healthy adults 71 and older.   Who Should Not Get Shingrix? You should not get Shingrix if you: . have ever had a severe allergic reaction to any component of the vaccine or after a dose of Shingrix  . tested negative for immunity to varicella zoster virus. If you test negative, you should get chickenpox vaccine.  Marland Kitchen  currently have shingles  . currently are pregnant or breastfeeding. Women who are pregnant or breastfeeding should wait to get Shingrix.  Marland Kitchen receive specific antiviral drugs (acyclovir, famciclovir, or valacyclovir) 24 hours before vaccination (avoid use of these antiviral drugs for 14 days after vaccination)- zoster vaccine live only If you have a minor acute (starts suddenly) illness, such as a cold, you may get Shingrix. But if you  have a moderate or severe acute illness, you should usually wait until you recover before getting the vaccine. This includes anyone with a temperature of 101.52F or higher. The side effects of the Shingrix are temporary, and usually last 2 to 3 days. While you may experience pain for a few days after getting Shingrix, the pain will be less severe than having shingles and the complications from the disease.  Risks of Shingrix vaccination:      A sore arm with mild or moderate pain is very common after recombinant shingles vaccine, affecting about 80% of vaccinated people. Redness and swelling can also happen at the site of the injection.     Tiredness, muscle pain, headache, shivering, fever, stomach pain, and nausea happen after vaccination in more than half of people who receive recombinant shingles vaccine.  In clinical trials, about 1 out of 6 people who got recombinant zoster vaccine experienced side effects that prevented them from doing regular activities. Symptoms usually went away on their own in 2 to 3 days.  You should still get the second dose of recombinant zoster vaccine even if you had one of these reactions after the first dose.  People sometimes faint after medical procedures, including vaccination. Tell your provider if you feel dizzy or have vision changes or ringing in the ears.  As with any medicine, there is a very remote chance of a vaccine causing a severe allergic reaction, other serious injury, or death.

## 2020-09-10 NOTE — Progress Notes (Signed)
Briana [name], Briana [name], Briana [name], Briana [name], Briana [name], Briana [name], Briana arm numbness and tingling and jaw pain. -     CBC with Differential/Platelet -     COMPLETE METABOLIC PANEL WITH GFR  Chronic Diastolic Heart Failure (HCC) Nadolol 20mg  daily - tremors Bumex 1mg  Mon, Wed Fri Follows with cardioilogy Disease process and medications discussed. Questions answered fully. Emphasized salt restriction, less than 2000mg  a day. Encouraged daily monitoring of the patient's weight, call office if 5 lb weight loss or gain in a day.  Encouraged regular exercise. If any increasing shortness of breath, swelling, or chest pressure go to ER immediately.  decrease your fluid intake to less than 2 L daily please remember to always increase your potassium intake with any increase of your fluid pill.    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 Improved Continue to monitor  Hyperlipidemia associated with type 2 diabetes mellitus ([place]) Continue medications: Discussed dietary and exercise modifications Low fat diet   Type 2 diabetes mellitus with stage 3a chronic kidney disease, without long-term current use of insulin (HCC)  Vitamin D deficiency Continue supplementation to maintain goal of 70-100 Taking Vitamin D 1,000 IU daily Defer vitamin D level  Paroxysmal atrial fibrillation (HCC) Acquired thrombophilia (HCC) Eliquis 5mg  BID, amiodarone 200mg  daily for rate control No concerns with excessive bleeding, no falls Continue medication: elquis Followed by cardiology  Type 2 diabetes mellitus with hyperlipidemia ([place]) Continue medications: Discussed general  issues about diabetes pathophysiology and management. Education: Reviewed 'ABCs' of diabetes management (respective goals in parentheses):  A1C (<7), blood pressure (<130/80), and cholesterol (LDL <70) Dietary recommendations Encouraged aerobic exercise.  Discussed foot care, check daily Yearly retinal exam Dental exam every 6 months Monitor blood glucose, discussed goal for patient   CKD stage 3 due to type 2 diabetes mellitus (HCC) Increase fluids  Avoid NSAIDS Blood pressure control Monitor sugars  Will continue to monitor  Need for influenza vaccination -     Flu vaccine HIGH DOSE PF  BMI 29.0-29.9,adult Discussed dietary and exercise modifications  Medication management Continued    Over 30 minutes of face to face interview, exam, counseling, chart review and critical decision making was performed.  Future Appointments  Date Time Provider [place]  01/29/2021  4:20 PM Troy Sine, MD [facility]  03/05/2021  3:00 PM Unk Pinto, MD [facility] None    Subjective:  Briana [name] is a 86 y.o. female who presents for 3 month Briana up.   She lives by herself, still drives, does her own medications, her daughter are helping her some. She has a shower with shower seat, no issues with dressing her self or cooking.   Patient is also followed by Dr Nelva Bush with [facility] for Lumbar spinal stenosis and had L3/L5 SS decompression surg in July 2016 by Dr Apolonio Schneiders.   She also has hereditary essential tremors.   She has seen ortho for right knee, had some fluid removed from her knee. Had blood in the fluid, and she is on eliquis and bASA, will stop the bASA.   She is newly diagnosed with a. Fib; on amiodarone and elequis, cardizem 240 mg  and atenolol.   Followed by Dr. Claiborne Billings.   BMI is Body mass index is 29.78 kg/m., she has been working on diet (avoiding fried foods) and exercise. Wt Readings from Last 3 Encounters:  09/10/20 157 lb 9.6 oz (71.5 kg)   07/31/20 157 lb 6.4 oz (71.4 kg)  07/01/20 161 lb (73 kg)    Her blood pressure has been controlled at home, today their BP is BP: 130/72 She does NOT workout, has been out of town and has had Briana knee pain. She denies chest pain, shortness of breath, Briana [name].     She has been working on diet and exercise for diabetes With CKD on ARB With hyperlipidemia not on cholesterol medication, not at goal less than 70 and denies foot ulcerations, increased appetite, Briana [name], paresthesia of the feet, polydipsia, polyuria, visual disturbances, vomiting and weight loss.  Last A1C in the office was:  Lab Results  Component Value Date   HGBA1C 6.3 (H) 02/28/2020   Lab Results  Component Value Date   CHOL 202 (H) 02/28/2020   HDL 67 02/28/2020   LDLCALC 119 (H) 02/28/2020   TRIG 67 02/28/2020   CHOLHDL 3.0 02/28/2020   Last GFR: Lab Results  Component Value Date   GFRNONAA 59 (L) 09/10/2020   Patient is on Vitamin D supplement and at goal at recent check:    Lab Results  Component Value Date   VD25OH 38 02/28/2020      Medication Review:   Current Outpatient Medications (Cardiovascular):  .  amiodarone (PACERONE) 200 MG tablet, Take 1 tablet (200 mg total) by mouth daily. .  bumetanide (BUMEX) 1 MG tablet, Take 1 mg by mouth daily. Take 1mg  Mon,Wed and Fri .  nadolol (CORGARD) 20 MG tablet, Take 0.5 tablets (10 mg total) by mouth daily. Take 1 tablet Daily for BP & Tremor .  olmesartan (BENICAR) 40 MG tablet, Take 1 tablet Daily for BP .  amLODipine (NORVASC) 5 MG tablet, Take 1.5 tablets (7.5 mg total) by mouth daily.  Current Outpatient Medications (Respiratory):  .  loratadine (CLARITIN) 10 MG tablet, Take 10 mg by mouth daily as needed for allergies. Marland Kitchen  PROAIR HFA 108 (90 Base) MCG/ACT inhaler, Use 2 Inhalations 15 minutes apart every 4 hours if needed to Rescue Asthma Attack .  promethazine-dextromethorphan (PROMETHAZINE-DM) 6.25-15 MG/5ML syrup, Take 5 mLs by mouth 4 (four)  times daily as needed for cough. (Patient not taking: Reported on 09/10/2020)  Current Outpatient Medications (Analgesics):  .  acetaminophen (TYLENOL) 500 MG tablet, Take 500-1,000 mg by mouth every 6 (six) hours as needed for moderate pain.  Current Outpatient Medications (Hematological):  .  apixaban (ELIQUIS) 5 MG TABS tablet, Take 1 tablet (5 mg total) by mouth 2 (two) times daily.  Current Outpatient Medications (Other):  Marland Kitchen  Ascorbic Acid (VITAMIN C) 1000 MG tablet, Take 1,000 mg by mouth daily.  .  cholecalciferol (VITAMIN D3) 25 MCG (1000 UT) tablet, Take 1,000 Units by mouth daily.  .  Misc Natural Products (OSTEO BI-FLEX JOINT SHIELD PO), Take 1 tablet by mouth 2 (two) times daily.  .  Naphazoline-Pheniramine (OPCON-A) 0.027-0.315 % SOLN, Place 1 drop into both eyes daily as needed (allergies).   Allergies  Allergen Reactions  . Accupril [Quinapril Hcl] Cough  . Neosporin [Neomycin-Bacitracin Zn-Polymyx]     unknown  . Prednisone     Agitated and shaky  . Reglan [Metoclopramide] Other (See Comments)    tremors  . Tramadol Briana [name] And Vomiting  .  Adhesive [Tape] Rash    Current Problems (verified) Patient Active Problem List   Diagnosis Date Noted  . 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 Acquired thrombophilia ([place]) 11/15/2019  . Chronic diastolic heart failure ([place]) 04/03/2019  . Unspecified atrial fibrillation ([place]) 11/21/2018  . CKD stage 3 due to type 2 diabetes mellitus ([place]) 02/23/2018  . Bilateral sensorineural hearing loss 07/02/2017  . Obesity (BMI 30.0-34.9) 09/05/2015  . Lumbar stenosis 06/05/2015  . Medication management 06/13/2014  . GERD 11/20/2013  . Osteopenia 11/20/2013  . Essential hypertension 11/19/2013  . Hyperlipidemia 11/19/2013  . Type 2 diabetes mellitus with hyperlipidemia ([place]) 11/19/2013  . Vitamin D deficiency 11/19/2013  . Diffuse cystic mastopathy 11/19/2013    Screening Tests Immunization  History  Administered Date(s) Administered  . DT (Pediatric) 09/05/2015  . DTaP 11/16/2004  . Influenza Whole 08/30/2013  . Influenza, High Dose Seasonal PF 08/28/2015, 08/10/2016, 11/20/2019  . Pneumococcal Conjugate-13 10/19/2016  . Pneumococcal Polysaccharide-23 11/16/2001  . Zoster 06/01/2013   Preventative care: Last colonoscopy: 2011 Mammogram: 12/2018 DEXA: 2019 - T - 1.6 radius    Prior vaccinations: TD or Tdap: 2016  Influenza: 2017, would like to get the flu shot  Pneumococcal: 2003 Prevnar13: 2017 Shingles/Zostavax: 2014 Influenza: 09/10/20  Names of Other Physician/Practitioners you currently use: 1. [facility] here for primary care 2. Dr. Katy Fitch, eye doctor, last visit 2020 3. Dr. Eliezer Bottom & Luretha Rued, dentist, last visit 2019  Patient Care Team: Unk Pinto, MD as PCP - General (Internal Medicine) Troy Sine, MD as PCP - Cardiology (Cardiology) Iran Planas, MD as Consulting Physician (Orthopedic Surgery) Ladene Artist, MD as Consulting Physician (Gastroenterology) Suella Broad, MD as Consulting Physician (Physical Medicine and Rehabilitation) Rolm Bookbinder, MD as Consulting Physician (Dermatology) Buford Dresser, MD as Consulting Physician (Cardiology)  SURGICAL HISTORY She  has a past surgical history that includes Thyroid surgery; varicose veins stripped; Tonsillectomy; Appendectomy; Tubal ligation; Open reduction internal fixation (orif) distal radial fracture (Right, 01/10/2014); Colonoscopy; Esophagogastroduodenoscopy; Breast surgery (Right); Joint replacement (Briana); Spinal cord decompression (06/05/2015); Lumbar laminectomy/decompression microdiscectomy (N/A, 06/05/2015); Cardioversion (N/A, 12/20/2018); Cardioversion (N/A, 06/16/2019); and Breast excisional biopsy (Right). FAMILY HISTORY Her family history includes Cancer in her brother; Heart disease in her sister; Hyperlipidemia in her mother;  Hypertension in her mother. SOCIAL HISTORY She  reports that she has never smoked. She has never used smokeless tobacco. She reports that she does not drink alcohol and does not use drugs.      Review of Systems  Constitutional: Negative for malaise/fatigue and weight loss.  HENT: Negative for hearing loss and tinnitus.   Eyes: Negative for blurred vision and double vision.  Respiratory: Negative for cough, sputum production, shortness of breath and wheezing.   Cardiovascular: Negative for chest pain, palpitations, orthopnea, claudication, leg swelling and PND.  Gastrointestinal: Negative for abdominal pain, blood in stool, constipation, diarrhea, heartburn, melena, Briana [name] and vomiting.  Genitourinary: Negative.   Musculoskeletal: Negative for falls, joint pain and myalgias.  Skin: Negative for rash.  Neurological: Negative for Briana [name], tingling, sensory change, weakness and headaches.  Endo/Heme/Allergies: Negative for polydipsia.  Psychiatric/Behavioral: Negative.  Negative for depression, memory loss, substance abuse and suicidal ideas. The patient is not nervous/anxious and does not have insomnia.   All other systems reviewed and are negative.    Objective:     Today's Vitals   09/10/20 1536  BP: 130/72  Pulse: 64  Temp: 98.1 F (36.7 C)  SpO2:  96%  Weight: 157 lb 9.6 oz (71.5 kg)   Body mass index is 29.78 kg/m.  General appearance: alert, no distress, WD/WN, female HEENT: normocephalic, sclerae anicteric, TMs pearly, nares patent, no discharge or erythema, pharynx normal Oral cavity: MMM, no lesions Neck: supple, no lymphadenopathy, no thyromegaly, no masses Heart: Irregularly irregular, normal S1, S2, no murmurs Lungs: CTA bilaterally, no wheezes, rhonchi, or rales Abdomen: +bs, soft, non tender, non distended, no masses, no hepatomegaly, no splenomegaly Musculoskeletal: nontender, no swelling, no obvious deformity Extremities: no edema, no cyanosis, no  clubbing Pulses: 2+ symmetric, upper and lower extremities, normal cap refill Neurological: alert, oriented x 3, CN2-12 intact, strength normal upper extremities and lower extremities, sensation normal throughout, DTRs 2+ throughout, no cerebellar signs, gait normal Psychiatric: normal affect, behavior normal, pleasant     Garnet Sierras, NP   09/11/2020

## 2020-09-11 LAB — CBC WITH DIFFERENTIAL/PLATELET
Absolute Monocytes: 724 cells/uL (ref 200–950)
Basophils Absolute: 29 cells/uL (ref 0–200)
Basophils Relative: 0.5 %
Eosinophils Absolute: 103 cells/uL (ref 15–500)
Eosinophils Relative: 1.8 %
HCT: 41 % (ref 35.0–45.0)
Hemoglobin: 13.5 g/dL (ref 11.7–15.5)
Lymphs Abs: 1032 cells/uL (ref 850–3900)
MCH: 31.5 pg (ref 27.0–33.0)
MCHC: 32.9 g/dL (ref 32.0–36.0)
MCV: 95.6 fL (ref 80.0–100.0)
MPV: 11.4 fL (ref 7.5–12.5)
Monocytes Relative: 12.7 %
Neutro Abs: 3813 cells/uL (ref 1500–7800)
Neutrophils Relative %: 66.9 %
Platelets: 183 10*3/uL (ref 140–400)
RBC: 4.29 10*6/uL (ref 3.80–5.10)
RDW: 13.5 % (ref 11.0–15.0)
Total Lymphocyte: 18.1 %
WBC: 5.7 10*3/uL (ref 3.8–10.8)

## 2020-09-11 LAB — COMPLETE METABOLIC PANEL WITH GFR
AG Ratio: 1.9 (calc) (ref 1.0–2.5)
ALT: 18 U/L (ref 6–29)
AST: 23 U/L (ref 10–35)
Albumin: 4.2 g/dL (ref 3.6–5.1)
Alkaline phosphatase (APISO): 93 U/L (ref 37–153)
BUN/Creatinine Ratio: 23 (calc) — ABNORMAL HIGH (ref 6–22)
BUN: 21 mg/dL (ref 7–25)
CO2: 28 mmol/L (ref 20–32)
Calcium: 9.9 mg/dL (ref 8.6–10.4)
Chloride: 105 mmol/L (ref 98–110)
Creat: 0.9 mg/dL — ABNORMAL HIGH (ref 0.60–0.88)
GFR, Est African American: 68 mL/min/{1.73_m2} (ref >=60)
GFR, Est Non African American: 59 mL/min/{1.73_m2} — ABNORMAL LOW (ref >=60)
Globulin: 2.2 g/dL (calc) (ref 1.9–3.7)
Glucose, Bld: 110 mg/dL — ABNORMAL HIGH (ref 65–99)
Potassium: 4.4 mmol/L (ref 3.5–5.3)
Sodium: 141 mmol/L (ref 135–146)
Total Bilirubin: 0.5 mg/dL (ref 0.2–1.2)
Total Protein: 6.4 g/dL (ref 6.1–8.1)

## 2020-09-12 DIAGNOSIS — M25632 Stiffness of left wrist, not elsewhere classified: Secondary | ICD-10-CM | POA: Diagnosis not present

## 2020-09-19 DIAGNOSIS — S52502D Unspecified fracture of the lower end of left radius, subsequent encounter for closed fracture with routine healing: Secondary | ICD-10-CM | POA: Diagnosis not present

## 2020-09-25 ENCOUNTER — Ambulatory Visit
Admission: RE | Admit: 2020-09-25 | Discharge: 2020-09-25 | Disposition: A | Discharge status: Home / Self Care | Payer: Medicare Other | Source: Ambulatory Visit | Attending: Adult Health Nurse Practitioner | Admitting: Adult Health Nurse Practitioner

## 2020-09-25 ENCOUNTER — Ambulatory Visit (INDEPENDENT_AMBULATORY_CARE_PROVIDER_SITE_OTHER): Payer: Medicare Other | Admitting: Adult Health Nurse Practitioner

## 2020-09-25 ENCOUNTER — Encounter: Payer: Self-pay | Admitting: Adult Health Nurse Practitioner

## 2020-09-25 ENCOUNTER — Other Ambulatory Visit: Payer: Self-pay

## 2020-09-25 VITALS — BP 120/64 | HR 56 | Temp 98.4°F | Ht 61.0 in | Wt 159.0 lb

## 2020-09-25 DIAGNOSIS — J8410 Pulmonary fibrosis, unspecified: Secondary | ICD-10-CM | POA: Diagnosis not present

## 2020-09-25 DIAGNOSIS — R0789 Other chest pain: Secondary | ICD-10-CM

## 2020-09-25 DIAGNOSIS — E2839 Other primary ovarian failure: Secondary | ICD-10-CM | POA: Diagnosis not present

## 2020-09-25 DIAGNOSIS — R0781 Pleurodynia: Secondary | ICD-10-CM

## 2020-09-25 DIAGNOSIS — I517 Cardiomegaly: Secondary | ICD-10-CM | POA: Diagnosis not present

## 2020-09-25 DIAGNOSIS — M8580 Other specified disorders of bone density and structure, unspecified site: Secondary | ICD-10-CM | POA: Diagnosis not present

## 2020-09-25 NOTE — Progress Notes (Signed)
Patient is aware of lab results. -e welch

## 2020-09-25 NOTE — Patient Instructions (Signed)
   INFORMATION ABOUT YOUR XRAY   IMAGING Can walk into [address] building for an Insurance account manager.   They will have the order and take you back. You do not any paper work, I should get the result back today or tomorrow.    Can call [phone] to schedule an appointment if you wish.       HOW TO SCHEDULE Bone Density, DEXA  [facility]  7 a.m.-6:30 p.m., Monday 7 a.m.-5 p.m., Tuesday-Friday Schedule an appointment by calling [phone].

## 2020-09-25 NOTE — Addendum Note (Signed)
Addended byGarnet Sierras A on: 09/25/2020 02:59 PM   Modules accepted: Orders

## 2020-09-25 NOTE — Progress Notes (Signed)
Assessment and Plan: Truly was seen today for acute visit.  Diagnoses and all orders for this visit:  Rib pain on right side -     DG Chest 2 View; Future Discussed using ice to the area. Discussed deep breathing exercises May continue tylenol 1,000mg  three times a day as needed for pain. Patient on eliquis, no NSAIDS.  Estrogen deficiency Osteopenia, unspecified location -     DG Bone Density; Future       Further disposition pending results of labs. Discussed med's effects and SE's.   Over 30 minutes of exam, counseling, chart review, and critical decision making was performed.   Future Appointments  Date Time Provider [place]  01/29/2021  4:20 PM Troy Sine, MD [facility]  03/05/2021  3:00 PM Unk Pinto, MD [facility] None    ------------------------------------------------------------------------------------------------------------------   HPI 86 y.o.female presents for nevaluation of right rib pain.  She reports 3-4 days ago she was taking her trash out.  She leaned onto a metal cast iron railing, and tossed her garbage into the trans can.  She report she used heat on it.  She did try tylenol 1,000mg  that seemed to ease the pain but not very much.  She reports anytime she tries to roll over it is very painful.  She is able to sleep, but wake related to the pain.  She reports being very careful when laying down as that action is very painful.  She is concerned because it is not getting any better.  She has full ROM with right arm had not issues ADL's, brushing hair,  She did have some pain while putting on her bra this am.  She is guarded during examination today.  She has history of osteopenia.  She is due for bone density.  Will place order today, she can call and schedule when feeling better.   Past Medical History:  Diagnosis Date  . Acute on chronic diastolic (congestive) heart failure ([place]) 11/21/2018  . Cancer ([place])    skin cancer  .  DDD (degenerative disc disease)   . Diverticula of colon   . Diverticulitis   . DJD (degenerative joint disease)   . GERD (gastroesophageal reflux disease)    takes ranitidine  . Heart murmur   . History of hiatal hernia   . History of pneumonia   . Hx of irritable bowel syndrome   . Hyperlipidemia   . Occasional tremors   . Osteoporosis   . Pre-diabetes   . Varicose veins   . Vitamin D deficiency      Allergies  Allergen Reactions  . Accupril [Quinapril Hcl] Cough  . Neosporin [Neomycin-Bacitracin Zn-Polymyx]     unknown  . Prednisone     Agitated and shaky  . Reglan [Metoclopramide] Other (See Comments)    tremors  . Tramadol Nausea And Vomiting  . Adhesive [Tape] Rash    Current Outpatient Medications on File Prior to Visit  Medication Sig  . acetaminophen (TYLENOL) 500 MG tablet Take 500-1,000 mg by mouth every 6 (six) hours as needed for moderate pain.  Marland Kitchen amiodarone (PACERONE) 200 MG tablet Take 1 tablet (200 mg total) by mouth daily.  Marland Kitchen apixaban (ELIQUIS) 5 MG TABS tablet Take 1 tablet (5 mg total) by mouth 2 (two) times daily.  . Ascorbic Acid (VITAMIN C) 1000 MG tablet Take 1,000 mg by mouth daily.   . bumetanide (BUMEX) 1 MG tablet Take 1 mg by mouth daily. Take 1mg  Mon,Wed and Fri  .  cholecalciferol (VITAMIN D3) 25 MCG (1000 UT) tablet Take 1,000 Units by mouth daily.   Marland Kitchen loratadine (CLARITIN) 10 MG tablet Take 10 mg by mouth daily as needed for allergies.  . Misc Natural Products (OSTEO BI-FLEX JOINT SHIELD PO) Take 1 tablet by mouth 2 (two) times daily.   . nadolol (CORGARD) 20 MG tablet Take 0.5 tablets (10 mg total) by mouth daily. Take 1 tablet Daily for BP & Tremor  . Naphazoline-Pheniramine (OPCON-A) 0.027-0.315 % SOLN Place 1 drop into both eyes daily as needed (allergies).  . olmesartan (BENICAR) 40 MG tablet Take 1 tablet Daily for BP  . PROAIR HFA 108 (90 Base) MCG/ACT inhaler Use 2 Inhalations 15 minutes apart every 4 hours if needed to Rescue Asthma  Attack  . promethazine-dextromethorphan (PROMETHAZINE-DM) 6.25-15 MG/5ML syrup Take 5 mLs by mouth 4 (four) times daily as needed for cough.  Marland Kitchen amLODipine (NORVASC) 5 MG tablet Take 1.5 tablets (7.5 mg total) by mouth daily.   No current facility-administered medications on file prior to visit.    ROS: all negative except above.   Physical Exam:  BP 120/64   Pulse (!) 56   Temp 98.4 F (36.9 C)   Ht 5\' 1"  (1.549 m)   Wt 159 lb (72.1 kg)   SpO2 95%   BMI 30.04 kg/m   General Appearance: Well nourished, in no apparent distress. Eyes: PERRLA, EOMs, conjunctiva no swelling or erythema Sinuses: No Frontal/maxillary tenderness ENT/Mouth: Ext aud canals clear, TMs without erythema, bulging. No erythema, swelling, or exudate on post pharynx.  Tonsils not swollen or erythematous. Hearing normal.  Neck: Supple, thyroid normal.  Respiratory: Respiratory effort shallow, BS equal bilaterally without rales, rhonchi, wheezing or stridor.  Cardio: RRR with no MRGs. Brisk peripheral pulses without edema.  Abdomen: Soft, + BS.  Non tender, no guarding, rebound, hernias, masses. Lymphatics: Non tender without lymphadenopathy.  Musculoskeletal: Full ROM, 5/5 strength, normal gait. Excrutiating pain to light palpation to 9-10 right lateral ribs.  No edema or ecchymosis noted. Skin: Warm, dry without rashes, lesions, ecchymosis.  Neuro: Cranial nerves intact. Normal muscle tone, no cerebellar symptoms. Sensation intact.  Psych: Awake and oriented X 3, normal affect, Insight and Judgment appropriate.      Garnet Sierras, Laqueta Jean, DNP [facility] 86 09/25/2020  2:29 PM

## 2020-09-27 ENCOUNTER — Other Ambulatory Visit: Payer: Self-pay | Admitting: Adult Health Nurse Practitioner

## 2020-09-27 DIAGNOSIS — M25632 Stiffness of left wrist, not elsewhere classified: Secondary | ICD-10-CM | POA: Diagnosis not present

## 2020-09-27 DIAGNOSIS — S29012A Strain of muscle and tendon of back wall of thorax, initial encounter: Secondary | ICD-10-CM

## 2020-09-27 MED ORDER — DEXAMETHASONE 4 MG PO TABS: ORAL_TABLET | ORAL | 0 refills | Status: DC

## 2020-10-17 DIAGNOSIS — S52502A Unspecified fracture of the lower end of left radius, initial encounter for closed fracture: Secondary | ICD-10-CM | POA: Diagnosis not present

## 2020-11-13 ENCOUNTER — Other Ambulatory Visit: Payer: Self-pay

## 2020-11-13 ENCOUNTER — Encounter: Payer: Self-pay | Admitting: Adult Health

## 2020-11-13 ENCOUNTER — Ambulatory Visit (INDEPENDENT_AMBULATORY_CARE_PROVIDER_SITE_OTHER): Payer: Medicare Other | Admitting: Adult Health

## 2020-11-13 VITALS — BP 122/62 | HR 50 | Temp 97.7°F | Wt 161.0 lb

## 2020-11-13 DIAGNOSIS — J40 Bronchitis, not specified as acute or chronic: Secondary | ICD-10-CM

## 2020-11-13 DIAGNOSIS — R053 Chronic cough: Secondary | ICD-10-CM | POA: Diagnosis not present

## 2020-11-13 DIAGNOSIS — J310 Chronic rhinitis: Secondary | ICD-10-CM

## 2020-11-13 DIAGNOSIS — R0789 Other chest pain: Secondary | ICD-10-CM

## 2020-11-13 MED ORDER — PROMETHAZINE-DM 6.25-15 MG/5ML PO SYRP: 5.0000 mL | ORAL_SOLUTION | Freq: Four times a day (QID) | ORAL | 1 refills | Status: DC | PRN

## 2020-11-13 MED ORDER — DEXAMETHASONE 0.5 MG PO TABS: ORAL_TABLET | ORAL | 0 refills | Status: DC

## 2020-11-13 NOTE — Patient Instructions (Addendum)
Try breztri 1 puff twice daily - brush teeth/rinse mouth/gargle after using  Take dexamethasone   Please get on a daily allergy medication if not doing so already   Please let me know how you are doing tomorrow  Did order chest xray, but get only if not getting better   Cough, Adult Coughing is a reflex that clears your throat and your airways (respiratory system). Coughing helps to heal and protect your lungs. It is normal to cough occasionally, but a cough that happens with other symptoms or lasts a long time may be a sign of a condition that needs treatment. An acute cough may only last 2-3 weeks, while a chronic cough may last 8 or more weeks. Coughing is commonly caused by:  Infection of the respiratory systemby viruses or bacteria.  Breathing in substances that irritate your lungs.  Allergies.  Asthma.  Mucus that runs down the back of your throat (postnasal drip).  Smoking.  Acid backing up from the stomach into the esophagus (gastroesophageal reflux).  Certain medicines.  Chronic lung problems.  Other medical conditions such as heart failure or a blood clot in the lung (pulmonary embolism). Follow these instructions at home: Medicines  Take over-the-counter and prescription medicines only as told by your health care provider.  Talk with your health care provider before you take a cough suppressant medicine. Lifestyle   Avoid cigarette smoke. Do not use any products that contain nicotine or tobacco, such as cigarettes, e-cigarettes, and chewing tobacco. If you need help quitting, ask your health care provider.  Drink enough fluid to keep your urine pale yellow.  Avoid caffeine.  Do not drink alcohol if your health care provider tells you not to drink. General instructions   Pay close attention to changes in your cough. Tell your health care provider about them.  Always cover your mouth when you cough.  Avoid things that make you cough, such as  perfume, candles, cleaning products, or campfire or tobacco smoke.  If the air is dry, use a cool mist vaporizer or humidifier in your bedroom or your home to help loosen secretions.  If your cough is worse at night, try to sleep in a semi-upright position.  Rest as needed.  Keep all follow-up visits as told by your health care provider. This is important. Contact a health care provider if you:  Have new symptoms.  Cough up pus.  Have a cough that does not get better after 2-3 weeks or gets worse.  Cannot control your cough with cough suppressant medicines and you are losing sleep.  Have pain that gets worse or pain that is not helped with medicine.  Have a fever.  Have unexplained weight loss.  Have night sweats. Get help right away if:  You cough up blood.  You have difficulty breathing.  Your heartbeat is very fast. These symptoms may represent a serious problem that is an emergency. Do not wait to see if the symptoms will go away. Get medical help right away. Call your local emergency services (911 in the [place] Do not drive yourself to the hospital. Summary  Coughing is a reflex that clears your throat and your airways. It is normal to cough occasionally, but a cough that happens with other symptoms or lasts a long time may be a sign of a condition that needs treatment.  Take over-the-counter and prescription medicines only as told by your health care provider.  Always cover your mouth when you cough.  Contact  a health care provider if you have new symptoms or a cough that does not get better after 2-3 weeks or gets worse. This information is not intended to replace advice given to you by your health care provider. Make sure you discuss any questions you have with your health care provider. Document Revised: 11/21/2018 Document Reviewed: 11/21/2018 Elsevier Patient Education  [place].

## 2020-11-13 NOTE — Progress Notes (Signed)
Assessment and Plan:  [name] was seen today for flank pain.  Diagnoses and all orders for this visit:  Persistent cough for 3 weeks or longer Left sided chest wall pain Suspect bronchitic cough, reports improvement with albuterol Minimally productive, has chest wall pain only with cough, chest wall tenderness on exam, good lung sounds, no dyspnea, I do not strongly suspect pneumonia Will try course of steroid and long acting inhaler; given breztri sample to try (2 week supply), 1 puff twice daily, advised take claritin daily for rhinitis, can continue mucinex, take cough syrup q6h and evaluate response in next 24 hours; if sig improved, can defer CXR If getting worse despite above therapy, get CXR and consider need for abx therapy Call office if not improving or with any worsening of sx Spoke with patient's daughter to communicate plan per patient request -     promethazine-dextromethorphan (PROMETHAZINE-DM) 6.25-15 MG/5ML syrup; Take 5 mLs by mouth 4 (four) times daily as needed for cough. -     DG Chest 2 View; Future -     dexamethasone (DECADRON) 0.5 MG tablet; Take 1 tablet PO BID for 5 days and then 1 tablet PO QDaily for 5 days.  Further disposition pending results of labs. Discussed med's effects and SE's.   Over 30 minutes of exam, counseling, chart review, and critical decision making was performed.   Future Appointments  Date Time Provider [place]  01/06/2021  4:30 PM [facility] DX DEXA 1 [facility]  01/29/2021  4:20 PM Troy Sine, MD [facility]  03/05/2021  3:00 PM Unk Pinto, MD [facility] None    ------------------------------------------------------------------------------------------------------------------   HPI BP 122/62   Pulse (!) 50   Temp 97.7 F (36.5 C)   Wt 161 lb (73 kg)   SpO2 96%   BMI 30.42 kg/m   86 y.o.female presents for evaluation of persistent mild dry cough, new left sided chest pain with cough. No cough for  duration of visit. Speaks in complete sentences without any distress.   She reports was having clear rhinitis, mild post nasal drip, then cough began 3-4 weeks ago, was initially intermittent/occasional/dry, however persistent, reports intermittent productive of scant brown secretions, and yesterday woke up noting L sided rib/chest/back pain with cough, none at rest.  Denies nasal/chest congestion or sore throat, endorses mild persistent clear rhinitis.   She reports has been taking mucinex, she has been taking promethazine DM in the last few days but irregularly. Has taken claritin in the past but reports hasn't been taking regularly.   She denies fever/chills, dyspnea, wheezing. Denies constant CP or with exertion, denies wheezing, edema. She does note more fatigued than usual, can still walk several loops on track for daily exercise. Denies edema, PND, orthopnea. Hx of GERD, cough not worse lying supine.   She report has some albuterol inhaler, did this 1-2 times and did feel this helped with cough.   Recently had CXR 09/25/2020:  Chronic cardiomegaly. Stable prominence of infrahilar markings when compared to 2017. Calcified granuloma at the right lower lobe. No pleural fluid seen on the lateral view. No acute osseous finding  She has a history of Diastolic CHF Denies dyspnea on exertion, orthopnea, paroxysmal nocturnal dyspnea and edema. Positive for cough. She has a. Fib, remains on elequis 5 mg BID.  Wt Readings from Last 3 Encounters:  11/13/20 161 lb (73 kg)  09/25/20 159 lb (72.1 kg)  09/10/20 157 lb 9.6 oz (71.5 kg)     Past  Medical History:  Diagnosis Date  . Acute on chronic diastolic (congestive) heart failure ([place]) 11/21/2018  . Cancer ([place])    skin cancer  . DDD (degenerative disc disease)   . Diverticula of colon   . Diverticulitis   . DJD (degenerative joint disease)   . GERD (gastroesophageal reflux disease)    takes ranitidine  . Heart murmur   . History of hiatal  hernia   . History of pneumonia   . Hx of irritable bowel syndrome   . Hyperlipidemia   . Occasional tremors   . Osteoporosis   . Pre-diabetes   . Varicose veins   . Vitamin D deficiency      Allergies  Allergen Reactions  . Accupril [Quinapril Hcl] Cough  . Neosporin [Neomycin-Bacitracin Zn-Polymyx]     unknown  . Prednisone     Agitated and shaky  . Reglan [Metoclopramide] Other (See Comments)    tremors  . Tramadol Nausea And Vomiting  . Adhesive [Tape] Rash    Current Outpatient Medications on File Prior to Visit  Medication Sig  . acetaminophen (TYLENOL) 500 MG tablet Take 500-1,000 mg by mouth every 6 (six) hours as needed for moderate pain.  Marland Kitchen amiodarone (PACERONE) 200 MG tablet Take 1 tablet (200 mg total) by mouth daily.  Marland Kitchen amLODipine (NORVASC) 5 MG tablet Take 1.5 tablets (7.5 mg total) by mouth daily.  Marland Kitchen apixaban (ELIQUIS) 5 MG TABS tablet Take 1 tablet (5 mg total) by mouth 2 (two) times daily.  . Ascorbic Acid (VITAMIN C) 1000 MG tablet Take 1,000 mg by mouth daily.   . bumetanide (BUMEX) 1 MG tablet Take 1 mg by mouth daily. Take 1mg  Mon,Wed and Fri  . cholecalciferol (VITAMIN D3) 25 MCG (1000 UT) tablet Take 1,000 Units by mouth daily.   Marland Kitchen loratadine (CLARITIN) 10 MG tablet Take 10 mg by mouth daily as needed for allergies.  . Misc Natural Products (OSTEO BI-FLEX JOINT SHIELD PO) Take 1 tablet by mouth 2 (two) times daily.   . nadolol (CORGARD) 20 MG tablet Take 0.5 tablets (10 mg total) by mouth daily. Take 1 tablet Daily for BP & Tremor  . Naphazoline-Pheniramine 0.027-0.315 % SOLN Place 1 drop into both eyes daily as needed (allergies).  . olmesartan (BENICAR) 40 MG tablet Take 1 tablet Daily for BP  . PROAIR HFA 108 (90 Base) MCG/ACT inhaler Use 2 Inhalations 15 minutes apart every 4 hours if needed to Rescue Asthma Attack   No current facility-administered medications on file prior to visit.    ROS: all negative except above.   Physical Exam:  BP  122/62   Pulse (!) 50   Temp 97.7 F (36.5 C)   Wt 161 lb (73 kg)   SpO2 96%   BMI 30.42 kg/m   General Appearance: Well nourished, in no apparent distress. Eyes: PERRLA, conjunctiva no swelling or erythema Sinuses: No Frontal/maxillary tenderness ENT/Mouth: Ext aud canals clear, TMs without erythema, bulging. No erythema, swelling, or exudate on post pharynx. Hearing normal.  Neck: Supple Respiratory: Respiratory effort normal, BS equal bilaterally without rales, rhonchi, wheezing or stridor. She has posterior/lateral lower left chest wall tenderness. No cough for duration of visit.  Cardio: RRR with no MRGs. Brisk peripheral pulses without edema.  Abdomen: Soft, + BS.  Non tender, no guarding, rebound, hernias, masses. No CVA tenderness.  Lymphatics: Non tender without lymphadenopathy.  Musculoskeletal: slow steady gait. Left lower/lateral/posterior chest wall tenderness.  Skin: Warm, dry without rashes, lesions, ecchymosis.  Neuro:  Normal muscle tone Psych: Awake and oriented X 3, normal affect, Insight and Judgment appropriate.     Izora Ribas, NP 4:31 PM [facility]

## 2020-11-21 ENCOUNTER — Other Ambulatory Visit: Payer: Self-pay | Admitting: Physician Assistant

## 2020-11-28 ENCOUNTER — Other Ambulatory Visit: Payer: Self-pay | Admitting: Cardiovascular Disease

## 2020-11-29 ENCOUNTER — Other Ambulatory Visit: Payer: Self-pay | Admitting: Cardiovascular Disease

## 2020-12-12 DIAGNOSIS — D485 Neoplasm of uncertain behavior of skin: Secondary | ICD-10-CM | POA: Diagnosis not present

## 2020-12-12 DIAGNOSIS — C44622 Squamous cell carcinoma of skin of right upper limb, including shoulder: Secondary | ICD-10-CM | POA: Diagnosis not present

## 2020-12-12 DIAGNOSIS — L918 Other hypertrophic disorders of the skin: Secondary | ICD-10-CM | POA: Diagnosis not present

## 2020-12-12 DIAGNOSIS — L570 Actinic keratosis: Secondary | ICD-10-CM | POA: Diagnosis not present

## 2021-01-06 ENCOUNTER — Other Ambulatory Visit: Payer: Medicare Other

## 2021-01-09 ENCOUNTER — Telehealth: Payer: Self-pay | Admitting: Cardiovascular Disease

## 2021-01-09 DIAGNOSIS — H1045 Other chronic allergic conjunctivitis: Secondary | ICD-10-CM | POA: Diagnosis not present

## 2021-01-09 DIAGNOSIS — H04123 Dry eye syndrome of bilateral lacrimal glands: Secondary | ICD-10-CM | POA: Diagnosis not present

## 2021-01-09 DIAGNOSIS — H40011 Open angle with borderline findings, low risk, right eye: Secondary | ICD-10-CM | POA: Diagnosis not present

## 2021-01-09 DIAGNOSIS — E119 Type 2 diabetes mellitus without complications: Secondary | ICD-10-CM | POA: Diagnosis not present

## 2021-01-09 DIAGNOSIS — H0102A Squamous blepharitis right eye, upper and lower eyelids: Secondary | ICD-10-CM | POA: Diagnosis not present

## 2021-01-09 DIAGNOSIS — H353131 Nonexudative age-related macular degeneration, bilateral, early dry stage: Secondary | ICD-10-CM | POA: Diagnosis not present

## 2021-01-09 DIAGNOSIS — H0102B Squamous blepharitis left eye, upper and lower eyelids: Secondary | ICD-10-CM | POA: Diagnosis not present

## 2021-01-09 DIAGNOSIS — H25813 Combined forms of age-related cataract, bilateral: Secondary | ICD-10-CM | POA: Diagnosis not present

## 2021-01-09 DIAGNOSIS — H401122 Primary open-angle glaucoma, left eye, moderate stage: Secondary | ICD-10-CM | POA: Diagnosis not present

## 2021-01-09 LAB — HM DIABETES EYE EXAM

## 2021-01-09 NOTE — Telephone Encounter (Signed)
Pt c/o swelling: STAT is pt has developed SOB within 24 hours  1) How much weight have you gained and in what time span? About 5 lbs in 1 months  2) If swelling, where is the swelling located? Feet and ankles  3) Are you currently taking a fluid pill? Yes, bumetanide (BUMEX) 1 MG tablet  4) Are you currently SOB? Yes   5) Do you have a log of your daily weights (if so, list)?  155 lbs 160 lbs  6) Have you gained 3 pounds in a day or 5 pounds in a week? No   7) Have you traveled recently? No   Pt c/o Shortness Of Breath: STAT if SOB developed within the last 24 hours or pt is noticeably SOB on the phone  1. Are you currently SOB (can you hear that pt is SOB on the phone)? Yes  2. How long have you been experiencing SOB? About 1 month   3. Are you SOB when sitting or when up moving around? When up and moving around   4. Are you currently experiencing any other symptoms? No

## 2021-01-09 NOTE — Telephone Encounter (Signed)
Spoke with patient regarding swelling in her legs, started 3 weeks ago  Having some shortness of breath with exertion  Blood pressure 167/76 and 172/72, no medication this am Does not weigh at home regularly, today 161 lb Scheduled for visit with Dr Claiborne Billings 3/16 Takes Bumex 1 mg daily   Discussed with Dr Claiborne Billings and will have patient take extra Bumex today and ok to take prn swelling or wt gain Advised patient and will have have weigh daily take Bumex if wt gain of 2-3 pounds 24 hours or 5 pounds in 1 week  Keep follow up as scheduled

## 2021-01-16 NOTE — Telephone Encounter (Signed)
Patient states she is still swollen. She states her feet and legs were more swollen yesterday, and have gone down since sleeping. She states they are also tight. She states it is not getting better. She states she has also gained more weight. Feb. 28th 155 lbs, next day 157 lbs, yesterday 158 lbs, today 157lbs. She states she has also been having a hard time breathing, but does not sound SOB on the phone.

## 2021-01-16 NOTE — Telephone Encounter (Signed)
Spoke with pt, patient instructed to take an extra bumex daily until her weight is back down to 155 lbs. Pt agreed with this plan.

## 2021-01-22 ENCOUNTER — Inpatient Hospital Stay (HOSPITAL_COMMUNITY)
Admission: EM | Admit: 2021-01-22 | Discharge: 2021-01-28 | DRG: 682 | Disposition: A | Discharge status: Home Health | Payer: Medicare Other | Attending: Internal Medicine | Admitting: Internal Medicine

## 2021-01-22 ENCOUNTER — Encounter (HOSPITAL_COMMUNITY): Payer: Self-pay | Admitting: Student

## 2021-01-22 ENCOUNTER — Other Ambulatory Visit: Payer: Self-pay

## 2021-01-22 ENCOUNTER — Emergency Department (HOSPITAL_COMMUNITY): Payer: Medicare Other

## 2021-01-22 DIAGNOSIS — E785 Hyperlipidemia, unspecified: Secondary | ICD-10-CM | POA: Diagnosis present

## 2021-01-22 DIAGNOSIS — R001 Bradycardia, unspecified: Secondary | ICD-10-CM | POA: Diagnosis not present

## 2021-01-22 DIAGNOSIS — J9601 Acute respiratory failure with hypoxia: Secondary | ICD-10-CM | POA: Diagnosis present

## 2021-01-22 DIAGNOSIS — I495 Sick sinus syndrome: Secondary | ICD-10-CM | POA: Diagnosis not present

## 2021-01-22 DIAGNOSIS — R9431 Abnormal electrocardiogram [ECG] [EKG]: Secondary | ICD-10-CM | POA: Diagnosis not present

## 2021-01-22 DIAGNOSIS — R630 Anorexia: Secondary | ICD-10-CM | POA: Diagnosis present

## 2021-01-22 DIAGNOSIS — Z8701 Personal history of pneumonia (recurrent): Secondary | ICD-10-CM

## 2021-01-22 DIAGNOSIS — K589 Irritable bowel syndrome without diarrhea: Secondary | ICD-10-CM | POA: Diagnosis present

## 2021-01-22 DIAGNOSIS — R0602 Shortness of breath: Secondary | ICD-10-CM

## 2021-01-22 DIAGNOSIS — R0902 Hypoxemia: Secondary | ICD-10-CM

## 2021-01-22 DIAGNOSIS — D6859 Other primary thrombophilia: Secondary | ICD-10-CM | POA: Diagnosis present

## 2021-01-22 DIAGNOSIS — N281 Cyst of kidney, acquired: Secondary | ICD-10-CM | POA: Diagnosis not present

## 2021-01-22 DIAGNOSIS — R251 Tremor, unspecified: Secondary | ICD-10-CM | POA: Diagnosis not present

## 2021-01-22 DIAGNOSIS — Z888 Allergy status to other drugs, medicaments and biological substances status: Secondary | ICD-10-CM

## 2021-01-22 DIAGNOSIS — R609 Edema, unspecified: Secondary | ICD-10-CM | POA: Diagnosis not present

## 2021-01-22 DIAGNOSIS — I4819 Other persistent atrial fibrillation: Secondary | ICD-10-CM | POA: Diagnosis not present

## 2021-01-22 DIAGNOSIS — R059 Cough, unspecified: Secondary | ICD-10-CM | POA: Diagnosis not present

## 2021-01-22 DIAGNOSIS — I4891 Unspecified atrial fibrillation: Secondary | ICD-10-CM | POA: Diagnosis present

## 2021-01-22 DIAGNOSIS — I5032 Chronic diastolic (congestive) heart failure: Secondary | ICD-10-CM | POA: Diagnosis present

## 2021-01-22 DIAGNOSIS — K219 Gastro-esophageal reflux disease without esophagitis: Secondary | ICD-10-CM | POA: Diagnosis present

## 2021-01-22 DIAGNOSIS — Z7901 Long term (current) use of anticoagulants: Secondary | ICD-10-CM

## 2021-01-22 DIAGNOSIS — I480 Paroxysmal atrial fibrillation: Secondary | ICD-10-CM

## 2021-01-22 DIAGNOSIS — Z96642 Presence of left artificial hip joint: Secondary | ICD-10-CM | POA: Diagnosis present

## 2021-01-22 DIAGNOSIS — E860 Dehydration: Secondary | ICD-10-CM | POA: Diagnosis present

## 2021-01-22 DIAGNOSIS — I130 Hypertensive heart and chronic kidney disease with heart failure and stage 1 through stage 4 chronic kidney disease, or unspecified chronic kidney disease: Secondary | ICD-10-CM | POA: Diagnosis present

## 2021-01-22 DIAGNOSIS — I4510 Unspecified right bundle-branch block: Secondary | ICD-10-CM | POA: Diagnosis present

## 2021-01-22 DIAGNOSIS — Z79899 Other long term (current) drug therapy: Secondary | ICD-10-CM

## 2021-01-22 DIAGNOSIS — E1165 Type 2 diabetes mellitus with hyperglycemia: Secondary | ICD-10-CM | POA: Diagnosis not present

## 2021-01-22 DIAGNOSIS — R7303 Prediabetes: Secondary | ICD-10-CM | POA: Diagnosis not present

## 2021-01-22 DIAGNOSIS — M810 Age-related osteoporosis without current pathological fracture: Secondary | ICD-10-CM | POA: Diagnosis not present

## 2021-01-22 DIAGNOSIS — D6869 Other thrombophilia: Secondary | ICD-10-CM | POA: Diagnosis not present

## 2021-01-22 DIAGNOSIS — Z85828 Personal history of other malignant neoplasm of skin: Secondary | ICD-10-CM

## 2021-01-22 DIAGNOSIS — B9620 Unspecified Escherichia coli [E. coli] as the cause of diseases classified elsewhere: Secondary | ICD-10-CM | POA: Diagnosis present

## 2021-01-22 DIAGNOSIS — E876 Hypokalemia: Secondary | ICD-10-CM | POA: Diagnosis not present

## 2021-01-22 DIAGNOSIS — Z6830 Body mass index (BMI) 30.0-30.9, adult: Secondary | ICD-10-CM

## 2021-01-22 DIAGNOSIS — I5043 Acute on chronic combined systolic (congestive) and diastolic (congestive) heart failure: Secondary | ICD-10-CM | POA: Diagnosis not present

## 2021-01-22 DIAGNOSIS — N179 Acute kidney failure, unspecified: Principal | ICD-10-CM | POA: Diagnosis present

## 2021-01-22 DIAGNOSIS — R6889 Other general symptoms and signs: Secondary | ICD-10-CM | POA: Diagnosis not present

## 2021-01-22 DIAGNOSIS — R296 Repeated falls: Secondary | ICD-10-CM | POA: Diagnosis not present

## 2021-01-22 DIAGNOSIS — E1169 Type 2 diabetes mellitus with other specified complication: Secondary | ICD-10-CM

## 2021-01-22 DIAGNOSIS — Z743 Need for continuous supervision: Secondary | ICD-10-CM | POA: Diagnosis not present

## 2021-01-22 DIAGNOSIS — R531 Weakness: Secondary | ICD-10-CM | POA: Diagnosis not present

## 2021-01-22 DIAGNOSIS — Z20822 Contact with and (suspected) exposure to covid-19: Secondary | ICD-10-CM | POA: Diagnosis present

## 2021-01-22 DIAGNOSIS — I5033 Acute on chronic diastolic (congestive) heart failure: Secondary | ICD-10-CM | POA: Diagnosis not present

## 2021-01-22 DIAGNOSIS — Z8249 Family history of ischemic heart disease and other diseases of the circulatory system: Secondary | ICD-10-CM

## 2021-01-22 DIAGNOSIS — I10 Essential (primary) hypertension: Secondary | ICD-10-CM | POA: Diagnosis present

## 2021-01-22 DIAGNOSIS — N1831 Chronic kidney disease, stage 3a: Secondary | ICD-10-CM | POA: Diagnosis not present

## 2021-01-22 DIAGNOSIS — I5022 Chronic systolic (congestive) heart failure: Secondary | ICD-10-CM | POA: Diagnosis not present

## 2021-01-22 DIAGNOSIS — Z9049 Acquired absence of other specified parts of digestive tract: Secondary | ICD-10-CM

## 2021-01-22 DIAGNOSIS — N3000 Acute cystitis without hematuria: Secondary | ICD-10-CM | POA: Diagnosis not present

## 2021-01-22 DIAGNOSIS — Z9851 Tubal ligation status: Secondary | ICD-10-CM

## 2021-01-22 DIAGNOSIS — Z91048 Other nonmedicinal substance allergy status: Secondary | ICD-10-CM

## 2021-01-22 LAB — D-DIMER, QUANTITATIVE: D-Dimer, Quant: 2.44 ug/mL-FEU — ABNORMAL HIGH (ref 0.00–0.50)

## 2021-01-22 LAB — CBC WITH DIFFERENTIAL/PLATELET
Abs Immature Granulocytes: 0.09 10*3/uL — ABNORMAL HIGH (ref 0.00–0.07)
Basophils Absolute: 0 10*3/uL (ref 0.0–0.1)
Basophils Relative: 0 %
Eosinophils Absolute: 0 10*3/uL (ref 0.0–0.5)
Eosinophils Relative: 0 %
HCT: 36 % (ref 36.0–46.0)
Hemoglobin: 11.8 g/dL — ABNORMAL LOW (ref 12.0–15.0)
Immature Granulocytes: 1 %
Lymphocytes Relative: 2 %
Lymphs Abs: 0.3 10*3/uL — ABNORMAL LOW (ref 0.7–4.0)
MCH: 29.9 pg (ref 26.0–34.0)
MCHC: 32.8 g/dL (ref 30.0–36.0)
MCV: 91.4 fL (ref 80.0–100.0)
Monocytes Absolute: 1.6 10*3/uL — ABNORMAL HIGH (ref 0.1–1.0)
Monocytes Relative: 10 %
Neutro Abs: 13.6 10*3/uL — ABNORMAL HIGH (ref 1.7–7.7)
Neutrophils Relative %: 87 %
Platelets: 187 10*3/uL (ref 150–400)
RBC: 3.94 MIL/uL (ref 3.87–5.11)
RDW: 14.4 % (ref 11.5–15.5)
WBC: 15.6 10*3/uL — ABNORMAL HIGH (ref 4.0–10.5)
nRBC: 0 % (ref 0.0–0.2)

## 2021-01-22 LAB — RESP PANEL BY RT-PCR (FLU A&B, COVID) ARPGX2
Influenza A by PCR: NEGATIVE
Influenza B by PCR: NEGATIVE
SARS Coronavirus 2 by RT PCR: NEGATIVE

## 2021-01-22 LAB — COMPREHENSIVE METABOLIC PANEL
ALT: 35 U/L (ref 0–44)
AST: 34 U/L (ref 15–41)
Albumin: 3 g/dL — ABNORMAL LOW (ref 3.5–5.0)
Alkaline Phosphatase: 93 U/L (ref 38–126)
Anion gap: 10 (ref 5–15)
BUN: 51 mg/dL — ABNORMAL HIGH (ref 8–23)
CO2: 26 mmol/L (ref 22–32)
Calcium: 8.6 mg/dL — ABNORMAL LOW (ref 8.9–10.3)
Chloride: 101 mmol/L (ref 98–111)
Creatinine, Ser: 2.09 mg/dL — ABNORMAL HIGH (ref 0.44–1.00)
GFR, Estimated: 23 mL/min — ABNORMAL LOW (ref >60)
Glucose, Bld: 195 mg/dL — ABNORMAL HIGH (ref 70–99)
Potassium: 2.9 mmol/L — ABNORMAL LOW (ref 3.5–5.1)
Sodium: 137 mmol/L (ref 135–145)
Total Bilirubin: 1.7 mg/dL — ABNORMAL HIGH (ref 0.3–1.2)
Total Protein: 5.6 g/dL — ABNORMAL LOW (ref 6.5–8.1)

## 2021-01-22 LAB — CK: Total CK: 41 U/L (ref 38–234)

## 2021-01-22 LAB — URINALYSIS, ROUTINE W REFLEX MICROSCOPIC
Bilirubin Urine: NEGATIVE
Glucose, UA: NEGATIVE mg/dL
Ketones, ur: NEGATIVE mg/dL
Nitrite: NEGATIVE
Protein, ur: 100 mg/dL — AB
Specific Gravity, Urine: 1.01 (ref 1.005–1.030)
WBC, UA: >50 WBC/hpf — ABNORMAL HIGH (ref 0–5)
pH: 5 (ref 5.0–8.0)

## 2021-01-22 LAB — CBG MONITORING, ED: Glucose-Capillary: 157 mg/dL — ABNORMAL HIGH (ref 70–99)

## 2021-01-22 LAB — TROPONIN I (HIGH SENSITIVITY)
Troponin I (High Sensitivity): 28 ng/L — ABNORMAL HIGH (ref <18)
Troponin I (High Sensitivity): 27 ng/L — ABNORMAL HIGH (ref <18)

## 2021-01-22 LAB — MAGNESIUM: Magnesium: 2.2 mg/dL (ref 1.7–2.4)

## 2021-01-22 LAB — BRAIN NATRIURETIC PEPTIDE: B Natriuretic Peptide: 464.4 pg/mL — ABNORMAL HIGH (ref 0.0–100.0)

## 2021-01-22 MED ORDER — SODIUM CHLORIDE 0.9 % IV SOLN: INTRAVENOUS | Status: DC

## 2021-01-22 MED ORDER — ALBUTEROL SULFATE HFA 108 (90 BASE) MCG/ACT IN AERS: 2.0000 | INHALATION_SPRAY | Freq: Four times a day (QID) | RESPIRATORY_TRACT | Status: DC | PRN

## 2021-01-22 MED ORDER — SODIUM CHLORIDE 0.9 % IV BOLUS
500.0000 mL | Freq: Once | INTRAVENOUS | Status: AC
  Administered 2021-01-22: 500 mL via INTRAVENOUS

## 2021-01-22 MED ORDER — AMIODARONE HCL 200 MG PO TABS
200.0000 mg | ORAL_TABLET | Freq: Every day | ORAL | Status: DC
  Administered 2021-01-23 – 2021-01-28 (×6): 200 mg via ORAL
  Filled 2021-01-22 (×6): qty 1

## 2021-01-22 MED ORDER — APIXABAN 5 MG PO TABS
5.0000 mg | ORAL_TABLET | Freq: Two times a day (BID) | ORAL | Status: DC
  Administered 2021-01-22 – 2021-01-23 (×2): 5 mg via ORAL
  Filled 2021-01-22 (×2): qty 1

## 2021-01-22 MED ORDER — INSULIN ASPART 100 UNIT/ML ~~LOC~~ SOLN
0.0000 [IU] | Freq: Three times a day (TID) | SUBCUTANEOUS | Status: DC
  Administered 2021-01-23 (×2): 1 [IU] via SUBCUTANEOUS
  Administered 2021-01-23: 3 [IU] via SUBCUTANEOUS
  Administered 2021-01-24 (×3): 2 [IU] via SUBCUTANEOUS
  Administered 2021-01-25 (×2): 1 [IU] via SUBCUTANEOUS
  Administered 2021-01-25: 2 [IU] via SUBCUTANEOUS
  Administered 2021-01-26: 1 [IU] via SUBCUTANEOUS
  Administered 2021-01-26 – 2021-01-28 (×5): 2 [IU] via SUBCUTANEOUS
  Filled 2021-01-22: qty 0.06

## 2021-01-22 MED ORDER — NADOLOL 20 MG PO TABS
20.0000 mg | ORAL_TABLET | Freq: Every day | ORAL | Status: DC
  Administered 2021-01-23: 20 mg via ORAL
  Filled 2021-01-22 (×2): qty 1

## 2021-01-22 MED ORDER — POTASSIUM CHLORIDE 20 MEQ PO PACK
20.0000 meq | PACK | Freq: Once | ORAL | Status: AC
  Administered 2021-01-22: 20 meq via ORAL
  Filled 2021-01-22: qty 1

## 2021-01-22 MED ORDER — AMLODIPINE BESYLATE 5 MG PO TABS: 7.5000 mg | ORAL_TABLET | Freq: Every day | ORAL | Status: DC

## 2021-01-22 NOTE — ED Provider Notes (Signed)
[facility] EMERGENCY DEPARTMENT Provider Note  CSN: [id number] Arrival date & time: 01/22/21 1721    History Chief Complaint  Patient presents with  . Fall    HPI  Briana [name] is a 87 y.o. female with multiple medical problems lives at home alone. She reports this morning around 5am she got up to use the restroom, was using her walker but became so weak her legs gave out and she lowered herself to the floor. She reports she was on the floor for about 19min before she was able to get herself up off the floor. She had urinated on herself and so she went to the bathroom, changed clothes and got back into bed. She then fell asleep. She woke up a while later to go back to the bathroom and again was too weak to walk and ended up on the floor. She denies any injuries but was not able to get up this time. She was on the floor for an unknown period of time before a neighbor came over to check on her. EMS was called and found her to have low SpO2 and started her on supplemental oxygen which helped. She was taken off oxygen before arrival but was found to be hypoxic here as well. She has been communicating with her cardiologist recently regarding leg and feet swelling and has been taking extra diuretics at home. Daughter at bedside reports she does not think the patient has been eating or drinking well lately. She denies any pains. No CP, N/V/D or dysuria.    Past Medical History:  Diagnosis Date  . Acute on chronic diastolic (congestive) heart failure ([place]) 11/21/2018  . Cancer ([place])    skin cancer  . DDD (degenerative disc disease)   . Diverticula of colon   . Diverticulitis   . DJD (degenerative joint disease)   . GERD (gastroesophageal reflux disease)    takes ranitidine  . Heart murmur   . History of hiatal hernia   . History of pneumonia   . Hx of irritable bowel syndrome   . Hyperlipidemia   . Occasional tremors   . Osteoporosis   . Pre-diabetes   . Varicose veins   . Vitamin D  deficiency     Past Surgical History:  Procedure Laterality Date  . APPENDECTOMY    . BREAST EXCISIONAL BIOPSY Right   . BREAST SURGERY Right    fluid drained from breast  . CARDIOVERSION N/A 12/20/2018   Procedure: CARDIOVERSION;  Surgeon: Buford Dresser, MD;  Location: [facility] ENDOSCOPY;  Service: Cardiovascular;  Laterality: N/A;  . CARDIOVERSION N/A 06/16/2019   Procedure: CARDIOVERSION;  Surgeon: Fay Records, MD;  Location: [facility];  Service: Cardiovascular;  Laterality: N/A;  . COLONOSCOPY    . ESOPHAGOGASTRODUODENOSCOPY    . JOINT REPLACEMENT Left    left hip  . LUMBAR LAMINECTOMY/DECOMPRESSION MICRODISCECTOMY N/A 06/05/2015   Procedure: L3-L5 DECOMPRESSION ;  Surgeon: Melina Schools, MD;  Location: [facility];  Service: Orthopedics;  Laterality: N/A;  . OPEN REDUCTION INTERNAL FIXATION (ORIF) DISTAL RADIAL FRACTURE Right 01/10/2014   Procedure: OPEN REDUCTION INTERNAL FIXATION (ORIF) DISTAL RADIAL FRACTURE;  Surgeon: Linna Hoff, MD;  Location: [facility];  Service: Orthopedics;  Laterality: Right;  . SPINAL CORD DECOMPRESSION  06/05/2015   L3 L 5  . THYROID SURGERY    . TONSILLECTOMY    . TUBAL LIGATION    . varicose veins stripped      Family History  Problem Relation Age of  Onset  . Hypertension Mother   . Hyperlipidemia Mother   . Heart disease Sister   . Cancer Brother        prostate    Social History   Tobacco Use  . Smoking status: Never Smoker  . Smokeless tobacco: Never Used  Substance Use Topics  . Alcohol use: No  . Drug use: No     Home Medications Prior to Admission medications   Medication Sig Start Date End Date Taking? Authorizing Provider  acetaminophen (TYLENOL) 500 MG tablet Take 500-1,000 mg by mouth every 6 (six) hours as needed for moderate pain.   Yes [provider]  amiodarone (PACERONE) 200 MG tablet Take 1 tablet (200 mg total) by mouth daily. 11/21/20  Yes Troy Sine, MD  amLODipine (NORVASC) 5 MG tablet Take 1.5  tablets (7.5 mg total) by mouth daily. 01/12/20 07/31/20 Yes Troy Sine, MD  apixaban (ELIQUIS) 5 MG TABS tablet Take 1 tablet (5 mg total) by mouth 2 (two) times daily. 08/19/20  Yes [name], NP  Ascorbic Acid (VITAMIN C) 1000 MG tablet Take 1,000 mg by mouth daily.    Yes [provider]  bumetanide (BUMEX) 1 MG tablet TAKE 1 TABLET BY MOUTH FIVE TIMES A WEEK ([place], TUESDAY, WEDNESDAY, [place], AND FRIDAY) Patient taking differently: Take 1 mg by mouth See admin instructions. Takes only 5 days a week; not on Saturday and Sunday. 11/29/20  Yes Troy Sine, MD  Carboxymeth-Glycerin-Polysorb (REFRESH DIGITAL OP) Place 1 drop into the right eye daily as needed (dry eyes).   Yes [provider]  cholecalciferol (VITAMIN D3) 25 MCG (1000 UT) tablet Take 1,000 Units by mouth 2 (two) times daily.   Yes [provider]  Misc Natural Products (OSTEO BI-FLEX JOINT SHIELD PO) Take 1 tablet by mouth 2 (two) times daily.    Yes [provider]  Multiple Vitamins-Minerals (PRESERVISION AREDS 2 PO) Take 1 capsule by mouth 2 (two) times daily.   Yes [provider]  nadolol (CORGARD) 20 MG tablet Take 0.5 tablets (10 mg total) by mouth daily. Take 1 tablet Daily for BP & Tremor Patient taking differently: Take 20 mg by mouth daily. 02/26/20  Yes Cleaver, Jossie Ng, NP  olmesartan (BENICAR) 40 MG tablet Take 1 tablet Daily for BP Patient taking differently: Take 40 mg by mouth daily. 02/28/20  Yes Unk Pinto, MD  Olopatadine HCl (PATADAY OP) Place 1 drop into the left eye daily.   Yes [provider]  PROAIR HFA 108 (90 Base) MCG/ACT inhaler Use 2 Inhalations 15 minutes apart every 4 hours if needed to Rescue Asthma Attack Patient taking differently: Inhale 2 puffs into the lungs every 6 (six) hours as needed for wheezing or shortness of breath. 04/24/20  Yes Unk Pinto, MD  promethazine-dextromethorphan (PROMETHAZINE-DM) 6.25-15 MG/5ML  syrup Take 5 mLs by mouth 4 (four) times daily as needed for cough. 11/13/20  Yes [name], NP  Pyridoxine HCl (VITAMIN B-6 PO) Take 1 capsule by mouth daily.   Yes [provider]  zinc gluconate 50 MG tablet Take 50 mg by mouth daily.   Yes [provider]  dexamethasone (DECADRON) 0.5 MG tablet Take 1 tablet PO BID for 5 days and then 1 tablet PO QDaily for 5 days. Patient not taking: No sig reported 11/13/20   [name], NP     Allergies    Accupril [quinapril hcl], Neosporin [neomycin-bacitracin zn-polymyx], Prednisone, Reglan [metoclopramide], Tramadol, and Adhesive [tape]  Review of Systems   Review of Systems A comprehensive review of systems was completed and negative except as noted in HPI.    Physical Exam BP 108/86   Pulse (!) 56   Temp 98.6 F (37 C) (Oral)   Resp 20   Ht 5\' 1"  (1.549 m)   Wt 74 kg   SpO2 96%   BMI 30.83 kg/m   Physical Exam Vitals and nursing note reviewed.  Constitutional:      Appearance: Normal appearance.  HENT:     Head: Normocephalic and atraumatic.     Nose: Nose normal.     Mouth/Throat:     Mouth: Mucous membranes are dry.  Eyes:     Extraocular Movements: Extraocular movements intact.     Conjunctiva/sclera: Conjunctivae normal.  Cardiovascular:     Rate and Rhythm: Normal rate.  Pulmonary:     Effort: Pulmonary effort is normal.     Breath sounds: Normal breath sounds.  Abdominal:     General: Abdomen is flat.     Palpations: Abdomen is soft.     Tenderness: There is no abdominal tenderness.  Musculoskeletal:        General: No swelling. Normal range of motion.     Cervical back: Neck supple.     Right lower leg: No edema.     Left lower leg: No edema.  Skin:    General: Skin is warm and dry.  Neurological:     General: No focal deficit present.     Mental Status: She is alert.  Psychiatric:        Mood and Affect: Mood normal.      ED Results / Procedures / Treatments    Labs (all labs ordered are listed, but only abnormal results are displayed) Labs Reviewed  COMPREHENSIVE METABOLIC PANEL - Abnormal; Notable for the following components:      Result Value   Potassium 2.9 (*)    Glucose, Bld 195 (*)    BUN 51 (*)    Creatinine, Ser 2.09 (*)    Calcium 8.6 (*)    Total Protein 5.6 (*)    Albumin 3.0 (*)    Total Bilirubin 1.7 (*)    GFR, Estimated 23 (*)    All other components within normal limits  BRAIN NATRIURETIC PEPTIDE - Abnormal; Notable for the following components:   B Natriuretic Peptide 464.4 (*)    All other components within normal limits  CBC WITH DIFFERENTIAL/PLATELET - Abnormal; Notable for the following components:   WBC 15.6 (*)    Hemoglobin 11.8 (*)    Neutro Abs 13.6 (*)    Lymphs Abs 0.3 (*)    Monocytes Absolute 1.6 (*)    Abs Immature Granulocytes 0.09 (*)    All other components within normal limits  D-DIMER, QUANTITATIVE - Abnormal; Notable for the following components:   D-Dimer, Quant 2.44 (*)    All other components within normal limits  TROPONIN I (HIGH SENSITIVITY) - Abnormal; Notable for the following components:   Troponin I (High Sensitivity) 28 (*)    All other components within normal limits  TROPONIN I (HIGH SENSITIVITY) - Abnormal; Notable for the following components:   Troponin I (High Sensitivity) 27 (*)    All other components within normal limits  RESP PANEL BY RT-PCR (FLU A&B, COVID) ARPGX2  CK  URINALYSIS, ROUTINE W REFLEX MICROSCOPIC  HEMOGLOBIN A1C  CBC  BASIC METABOLIC PANEL  TSH  MAGNESIUM    EKG EKG Interpretation  Date/Time:  Wednesday January 22 2021 17:38:46 EST Ventricular Rate:  60 PR Interval:    QRS Duration: 121 QT Interval:  561 QTC Calculation: 561 R Axis:   -105 Text Interpretation: Sinus rhythm Right bundle branch block Anteroseptal infarct, age indeterminate No significant change since last tracing Confirmed by Calvert Cantor [phone]) on 01/22/2021 6:25:39  PM   Radiology DG Chest Port 1 View  Result Date: 01/22/2021 CLINICAL DATA:  Weakness and shortness of breath, recent fall EXAM: PORTABLE CHEST 1 VIEW COMPARISON:  09/26/2019 FINDINGS: Cardiac shadow is enlarged but stable. Aortic calcifications are seen. Lungs are well aerated bilaterally. No focal infiltrate or sizable effusion is seen. Calcified granuloma in the right base is noted. No acute bony abnormality is noted. IMPRESSION: No acute abnormality noted. Electronically Signed   By: [name] M.D.   On: 01/22/2021 18:50    Procedures Procedures  Medications Ordered in the ED Medications  amiodarone (PACERONE) tablet 200 mg (has no administration in time range)  amLODipine (NORVASC) tablet 7.5 mg (has no administration in time range)  nadolol (CORGARD) tablet 20 mg (has no administration in time range)  apixaban (ELIQUIS) tablet 5 mg (has no administration in time range)  albuterol (VENTOLIN HFA) 108 (90 Base) MCG/ACT inhaler 2 puff (has no administration in time range)  insulin aspart (novoLOG) injection 0-6 Units (has no administration in time range)  0.9 %  sodium chloride infusion (has no administration in time range)  sodium chloride 0.9 % bolus 500 mL (0 mLs Intravenous Stopped 01/22/21 2000)  potassium chloride (KLOR-CON) packet 20 mEq (20 mEq Oral Given 01/22/21 2153)     MDM Rules/Calculators/A&P MDM Patient with generalized weakness, signs of dehydration by exam. Also hypoxic of unclear etiology. Will check labs, EKG and CXR.   ED Course  I have reviewed the triage vital signs and the nursing notes.  Pertinent labs & imaging results that were available during my care of the patient were reviewed by me and considered in my medical decision making (see chart for details).  Clinical Course as of 01/22/21 2249  Wed Jan 22, 2021  1854 CBC with mild leukocytosis.  [CS]  6010 CXR is clear. Will send for CTA to rule out PE as the cause of her weakness and hypoxia.  [CS]   1935 CMP with elevated creatinine, previously was normal. Cannot send for CTA due to AKI.  [CS]  2007 Spoke with Dr. Hal Hope, Hospitalist, who will evaluate for admission.  [CS]    Clinical Course User Index [CS] Truddie Hidden, MD    Final Clinical Impression(s) / ED Diagnoses Final diagnoses:  AKI (acute kidney injury) ([place])  Hypoxia  Hypokalemia    Rx / DC Orders ED Discharge Orders    None       Truddie Hidden, MD 01/22/21 2249

## 2021-01-22 NOTE — ED Notes (Addendum)
Patient's oxygen saturations dropped to 85-86% on room air, patient's lips are bluish in color, patient denies feeling SOB.  Placed on 2L oxygen via nasal cannula and saturations improved to 93-96%, lips are now a pink color. Karle Starch, EDP made aware.

## 2021-01-22 NOTE — ED Triage Notes (Signed)
Patient BIB [facility] from home c/o multiple falls. Fell at 0500 and was in the floor for several house, was incontinent also.  Friend came over and patient had fallen ago.  Patient has told two different stories about how long she was in the floor for so that is unknown.  Recent diuretic increase due to increased edema.  Patient was 87% on room air with EMS, placed 2 L, came up to 98% but then they removed oxygen and she stayed at 93%.  EMS placed 20j left hand.  Denies pain or LOC.  Vitals were 93% room air now 220-CBG 116/70 64-HR 17-RR

## 2021-01-22 NOTE — H&P (Addendum)
History and Physical    Briana [name] UKG:[id number] DOB: 01-Nov-1934 DOA: 01/22/2021  PCP: Unk Pinto, MD  Patient coming from: Home.  Chief Complaint: Weakness and falls.  HPI: Briana [name] is a 87 y.o. female with history of paroxysmal atrial fibrillation, hypertension, CHF was brought to the ER after patient found to be weak and had been having falls.  Over the last 2 weeks patient has had her Bumex dose increased twice due to increasing lower extremity edema.  Over the last 3 days patient was finding it difficult to ambulate and had to use her walker which she rarely uses.  Patient is finding it difficult to ambulate because of weakness.  Over the last 24 hours patient was on the floor twice.  Today when patient's neighbor came to check on her was found on the floor.  Patient states he was conscious all the time did not hit her head and she is very positive about that.  EMS was called patient was initially found to be mildly hypoxic with oxygen saturation 88% and on 2 L improved.  Was brought to the ER per denies any chest pain shortness of breath had some nausea vomiting about a week ago.  ED Course: In the ER patient is afebrile with labs showing creatinine of 2.09 which increased from 1.9 in October 2021.  WBC count was 15.6 blood pressure is in the 762 systolic.  EKG shows sinus rhythm with prolonged QTC and high sensitive troponin was 28 and 27.  BNP was 467.  Patient admitted for generalized weakness with acute renal failure.  Covid test was negative.  On exam patient appears nonfocal.  Review of Systems: As per HPI, rest all negative.   Past Medical History:  Diagnosis Date  . Acute on chronic diastolic (congestive) heart failure ([place]) 11/21/2018  . Cancer ([place])    skin cancer  . DDD (degenerative disc disease)   . Diverticula of colon   . Diverticulitis   . DJD (degenerative joint disease)   . GERD (gastroesophageal reflux disease)    takes ranitidine  . Heart murmur   .  History of hiatal hernia   . History of pneumonia   . Hx of irritable bowel syndrome   . Hyperlipidemia   . Occasional tremors   . Osteoporosis   . Pre-diabetes   . Varicose veins   . Vitamin D deficiency     Past Surgical History:  Procedure Laterality Date  . APPENDECTOMY    . BREAST EXCISIONAL BIOPSY Right   . BREAST SURGERY Right    fluid drained from breast  . CARDIOVERSION N/A 12/20/2018   Procedure: CARDIOVERSION;  Surgeon: Buford Dresser, MD;  Location: [facility] ENDOSCOPY;  Service: Cardiovascular;  Laterality: N/A;  . CARDIOVERSION N/A 06/16/2019   Procedure: CARDIOVERSION;  Surgeon: Fay Records, MD;  Location: [place];  Service: Cardiovascular;  Laterality: N/A;  . COLONOSCOPY    . ESOPHAGOGASTRODUODENOSCOPY    . JOINT REPLACEMENT Left    left hip  . LUMBAR LAMINECTOMY/DECOMPRESSION MICRODISCECTOMY N/A 06/05/2015   Procedure: L3-L5 DECOMPRESSION ;  Surgeon: Melina Schools, MD;  Location: [place];  Service: Orthopedics;  Laterality: N/A;  . OPEN REDUCTION INTERNAL FIXATION (ORIF) DISTAL RADIAL FRACTURE Right 01/10/2014   Procedure: OPEN REDUCTION INTERNAL FIXATION (ORIF) DISTAL RADIAL FRACTURE;  Surgeon: Linna Hoff, MD;  Location: [place];  Service: Orthopedics;  Laterality: Right;  . SPINAL CORD DECOMPRESSION  06/05/2015   L3 L 5  . THYROID SURGERY    .  TONSILLECTOMY    . TUBAL LIGATION    . varicose veins stripped       reports that she has never smoked. She has never used smokeless tobacco. She reports that she does not drink alcohol and does not use drugs.  Allergies  Allergen Reactions  . Accupril [Quinapril Hcl] Cough  . Neosporin [Neomycin-Bacitracin Zn-Polymyx]     unknown  . Prednisone     Agitated and shaky  . Reglan [Metoclopramide] Other (See Comments)    tremors  . Tramadol Nausea And Vomiting  . Adhesive [Tape] Rash    Family History  Problem Relation Age of Onset  . Hypertension Mother   . Hyperlipidemia Mother   . Heart disease  Sister   . Cancer Brother        prostate    Prior to Admission medications   Medication Sig Start Date End Date Taking? Authorizing Provider  acetaminophen (TYLENOL) 500 MG tablet Take 500-1,000 mg by mouth every 6 (six) hours as needed for moderate pain.   Yes [provider]  amiodarone (PACERONE) 200 MG tablet Take 1 tablet (200 mg total) by mouth daily. 11/21/20  Yes Troy Sine, MD  amLODipine (NORVASC) 5 MG tablet Take 1.5 tablets (7.5 mg total) by mouth daily. 01/12/20 07/31/20 Yes Troy Sine, MD  apixaban (ELIQUIS) 5 MG TABS tablet Take 1 tablet (5 mg total) by mouth 2 (two) times daily. 08/19/20  Yes [name], NP  Ascorbic Acid (VITAMIN C) 1000 MG tablet Take 1,000 mg by mouth daily.    Yes [provider]  bumetanide (BUMEX) 1 MG tablet TAKE 1 TABLET BY MOUTH FIVE TIMES A WEEK ([place], TUESDAY, WEDNESDAY, [place], AND FRIDAY) Patient taking differently: Take 1 mg by mouth See admin instructions. Takes only 5 days a week; not on Saturday and Sunday. 11/29/20  Yes Troy Sine, MD  Carboxymeth-Glycerin-Polysorb (REFRESH DIGITAL OP) Place 1 drop into the right eye daily as needed (dry eyes).   Yes [provider]  cholecalciferol (VITAMIN D3) 25 MCG (1000 UT) tablet Take 1,000 Units by mouth 2 (two) times daily.   Yes [provider]  Misc Natural Products (OSTEO BI-FLEX JOINT SHIELD PO) Take 1 tablet by mouth 2 (two) times daily.    Yes [provider]  Multiple Vitamins-Minerals (PRESERVISION AREDS 2 PO) Take 1 capsule by mouth 2 (two) times daily.   Yes [provider]  nadolol (CORGARD) 20 MG tablet Take 0.5 tablets (10 mg total) by mouth daily. Take 1 tablet Daily for BP & Tremor Patient taking differently: Take 20 mg by mouth daily. 02/26/20  Yes Cleaver, Jossie Ng, NP  olmesartan (BENICAR) 40 MG tablet Take 1 tablet Daily for BP Patient taking differently: Take 40 mg by mouth daily. 02/28/20  Yes Unk Pinto, MD   Olopatadine HCl (PATADAY OP) Place 1 drop into the left eye daily.   Yes [provider]  PROAIR HFA 108 (90 Base) MCG/ACT inhaler Use 2 Inhalations 15 minutes apart every 4 hours if needed to Rescue Asthma Attack Patient taking differently: Inhale 2 puffs into the lungs every 6 (six) hours as needed for wheezing or shortness of breath. 04/24/20  Yes Unk Pinto, MD  promethazine-dextromethorphan (PROMETHAZINE-DM) 6.25-15 MG/5ML syrup Take 5 mLs by mouth 4 (four) times daily as needed for cough. 11/13/20  Yes [name], NP  Pyridoxine HCl (VITAMIN B-6 PO) Take 1 capsule by mouth daily.   Yes [provider]  zinc gluconate 50 MG  tablet Take 50 mg by mouth daily.   Yes [provider]  dexamethasone (DECADRON) 0.5 MG tablet Take 1 tablet PO BID for 5 days and then 1 tablet PO QDaily for 5 days. Patient not taking: No sig reported 11/13/20   [name], NP    Physical Exam: Constitutional: Moderately built and nourished. Vitals:   01/22/21 1900 01/22/21 1930 01/22/21 2100 01/22/21 2130  BP: (!) 102/55 (!) 106/54 108/79 108/86  Pulse: (!) 54 (!) 57 (!) 56 (!) 56  Resp: (!) 22 (!) 21 20 20   Temp:      TempSrc:      SpO2: 96% 98% 97% 96%  Weight:      Height:       Eyes: Anicteric no pallor. ENMT: No discharge from the ears eyes nose or mouth. Neck: No mass felt.  No neck rigidity. Respiratory: No rhonchi or crepitations. Cardiovascular: S1-S2 heard. Abdomen: Soft nontender bowel sounds present. Musculoskeletal: No edema. Skin: No rash. Neurologic: Alert awake oriented to time place and person.  Moving all extremities 5 x 5. Psychiatric: Appears normal.  Normal affect.   Labs on Admission: I have personally reviewed following labs and imaging studies  CBC: Recent Labs  Lab 01/22/21 1745  WBC 15.6*  NEUTROABS 13.6*  HGB 11.8*  HCT 36.0  MCV 91.4  PLT 017   Basic Metabolic Panel: Recent Labs  Lab 01/22/21 1745  NA 137  K 2.9*   CL 101  CO2 26  GLUCOSE 195*  BUN 51*  CREATININE 2.09*  CALCIUM 8.6*   GFR: Estimated Creatinine Clearance: 18.1 mL/min (A) (by C-G formula based on SCr of 2.09 mg/dL (H)). Liver Function Tests: Recent Labs  Lab 01/22/21 1745  AST 34  ALT 35  ALKPHOS 93  BILITOT 1.7*  PROT 5.6*  ALBUMIN 3.0*   No results for input(s): LIPASE, AMYLASE in the last 168 hours. No results for input(s): AMMONIA in the last 168 hours. Coagulation Profile: No results for input(s): INR, PROTIME in the last 168 hours. Cardiac Enzymes: Recent Labs  Lab 01/22/21 1745  CKTOTAL 41   BNP (last 3 results) No results for input(s): PROBNP in the last 8760 hours. HbA1C: No results for input(s): HGBA1C in the last 72 hours. CBG: No results for input(s): GLUCAP in the last 168 hours. Lipid Profile: No results for input(s): CHOL, HDL, LDLCALC, TRIG, CHOLHDL, LDLDIRECT in the last 72 hours. Thyroid Function Tests: No results for input(s): TSH, T4TOTAL, FREET4, T3FREE, THYROIDAB in the last 72 hours. Anemia Panel: No results for input(s): VITAMINB12, FOLATE, FERRITIN, TIBC, IRON, RETICCTPCT in the last 72 hours. Urine analysis:    Component Value Date/Time   COLORURINE YELLOW 02/28/2020 1459   APPEARANCEUR CLEAR 02/28/2020 1459   LABSPEC 1.012 02/28/2020 1459   PHURINE 6.5 02/28/2020 1459   GLUCOSEU NEGATIVE 02/28/2020 1459   HGBUR TRACE (A) 02/28/2020 1459   BILIRUBINUR NEGATIVE 10/19/2016 1128   KETONESUR NEGATIVE 02/28/2020 1459   PROTEINUR NEGATIVE 02/28/2020 1459   UROBILINOGEN 0.2 12/12/2012 1240   NITRITE NEGATIVE 02/28/2020 1459   LEUKOCYTESUR 1+ (A) 02/28/2020 1459   Sepsis Labs: @LABRCNTIP (procalcitonin:4,lacticidven:4) ) Recent Results (from the past 240 hour(s))  Resp Panel by RT-PCR (Flu A&B, Covid) Nasopharyngeal Swab     Status: None   Collection Time: 01/22/21  8:04 PM   Specimen: Nasopharyngeal Swab; Nasopharyngeal(NP) swabs in vial transport medium  Result Value Ref Range  Status   SARS Coronavirus 2 by RT PCR NEGATIVE NEGATIVE Final  Comment: (NOTE) SARS-CoV-2 target nucleic acids are NOT DETECTED.  The SARS-CoV-2 RNA is generally detectable in upper respiratory specimens during the acute phase of infection. The lowest concentration of SARS-CoV-2 viral copies this assay can detect is 138 copies/mL. A negative result does not preclude SARS-Cov-2 infection and should not be used as the sole basis for treatment or other patient management decisions. A negative result may occur with  improper specimen collection/handling, submission of specimen other than nasopharyngeal swab, presence of viral mutation(s) within the areas targeted by this assay, and inadequate number of viral copies(<138 copies/mL). A negative result must be combined with clinical observations, patient history, and epidemiological information. The expected result is Negative.  Fact Sheet for Patients:  EntrepreneurPulse.com.au  Fact Sheet for Healthcare Providers:  IncredibleEmployment.be  This test is no t yet approved or cleared by the [place] FDA and  has been authorized for detection and/or diagnosis of SARS-CoV-2 by FDA under an Emergency Use Authorization (EUA). This EUA will remain  in effect (meaning this test can be used) for the duration of the COVID-19 declaration under Section 564(b)(1) of the Act, 21 U.S.C.section 360bbb-3(b)(1), unless the authorization is terminated  or revoked sooner.       Influenza A by PCR NEGATIVE NEGATIVE Final   Influenza B by PCR NEGATIVE NEGATIVE Final    Comment: (NOTE) The Xpert Xpress SARS-CoV-2/FLU/RSV plus assay is intended as an aid in the diagnosis of influenza from Nasopharyngeal swab specimens and should not be used as a sole basis for treatment. Nasal washings and aspirates are unacceptable for Xpert Xpress SARS-CoV-2/FLU/RSV testing.  Fact Sheet for  Patients: EntrepreneurPulse.com.au  Fact Sheet for Healthcare Providers: IncredibleEmployment.be  This test is not yet approved or cleared by the [place] FDA and has been authorized for detection and/or diagnosis of SARS-CoV-2 by FDA under an Emergency Use Authorization (EUA). This EUA will remain in effect (meaning this test can be used) for the duration of the COVID-19 declaration under Section 564(b)(1) of the Act, 21 U.S.C. section 360bbb-3(b)(1), unless the authorization is terminated or revoked.  Performed at [facility], [place] [address], [place], [place] [address]      Radiological Exams on Admission: DG Chest Port 1 View  Result Date: 01/22/2021 CLINICAL DATA:  Weakness and shortness of breath, recent fall EXAM: PORTABLE CHEST 1 VIEW COMPARISON:  09/26/2019 FINDINGS: Cardiac shadow is enlarged but stable. Aortic calcifications are seen. Lungs are well aerated bilaterally. No focal infiltrate or sizable effusion is seen. Calcified granuloma in the right base is noted. No acute bony abnormality is noted. IMPRESSION: No acute abnormality noted. Electronically Signed   By: [name] M.D.   On: 01/22/2021 18:50    EKG: Independently reviewed.  Heart rate is around 60 beats minute with sinus rhythm RBBB and prolonged QTC.  Assessment/Plan Principal Problem:   AKI (acute kidney injury) ([place]) Active Problems:   Essential hypertension   Unspecified atrial fibrillation ([facility])   Chronic diastolic heart failure (H[facility])   Acquired thrombophilia ([place])    1. Acute renal failure -could be from recent increase in Bumex dose transiently.  Also patient has been poor oral intake.  We will gently hydrate hold losartan and Bumex for now.  Follow intake output metabolic panel.  If creatinine does not improve then further imaging. 2. Generalized weakness could be from dehydration.  Gently hydrate and get physical therapy  consult. 3. Possible UTI -UA shows features concerning for UTI.  Will get urine culture keep  patient on ceftriaxone. 4. Leukocytosis could be reactionary.  Overall UA showing features concerning for UTI for which patient be on ceftriaxone. 5. Paroxysmal atrial fibrillation on amiodarone patient also takes nadolol.  On apixaban. 6. History of diastolic CHF last EF measured in 2020 was 60 to 65%.  Presently receiving fluids due to dehydration and acute renal failure holding Bumex. 7. Mild hypoxia noted on admission.  D-dimer is pending.  If positive will get VQ scan. 8. Hypertension since blood pressures in the low normal avoiding amlodipine.  Holding losartan due to acute renal failure.  As needed IV hydralazine for systolic more than 638. 9. Prolonged QTC avoid QTC prolonging medications. 10. History of prediabetes we will closely follow CBGs.  We will keep patient sliding scale coverage. 11. Hypokalemia -replace and recheck.  Could be from diuretics.   DVT prophylaxis: Apixaban. Code Status: Full code. Family Communication: Patient's daughter at the bedside. Disposition Plan: To be determined. Consults called: Physical therapy. Admission status: Observation.   Rise Patience MD [facility] Pager [phone].  If 7PM-7AM, please contact night-coverage [email or web address] Password [email or web address]  01/22/2021, 10:00 PM

## 2021-01-23 ENCOUNTER — Inpatient Hospital Stay (HOSPITAL_COMMUNITY): Payer: Medicare Other

## 2021-01-23 DIAGNOSIS — E876 Hypokalemia: Secondary | ICD-10-CM | POA: Diagnosis not present

## 2021-01-23 DIAGNOSIS — I10 Essential (primary) hypertension: Secondary | ICD-10-CM

## 2021-01-23 DIAGNOSIS — I495 Sick sinus syndrome: Secondary | ICD-10-CM | POA: Diagnosis not present

## 2021-01-23 DIAGNOSIS — I5022 Chronic systolic (congestive) heart failure: Secondary | ICD-10-CM | POA: Diagnosis not present

## 2021-01-23 DIAGNOSIS — I5043 Acute on chronic combined systolic (congestive) and diastolic (congestive) heart failure: Secondary | ICD-10-CM | POA: Diagnosis present

## 2021-01-23 DIAGNOSIS — N281 Cyst of kidney, acquired: Secondary | ICD-10-CM | POA: Diagnosis not present

## 2021-01-23 DIAGNOSIS — Z85828 Personal history of other malignant neoplasm of skin: Secondary | ICD-10-CM | POA: Diagnosis not present

## 2021-01-23 DIAGNOSIS — I517 Cardiomegaly: Secondary | ICD-10-CM | POA: Diagnosis not present

## 2021-01-23 DIAGNOSIS — I480 Paroxysmal atrial fibrillation: Secondary | ICD-10-CM | POA: Diagnosis not present

## 2021-01-23 DIAGNOSIS — M810 Age-related osteoporosis without current pathological fracture: Secondary | ICD-10-CM | POA: Diagnosis present

## 2021-01-23 DIAGNOSIS — N390 Urinary tract infection, site not specified: Secondary | ICD-10-CM | POA: Diagnosis not present

## 2021-01-23 DIAGNOSIS — I5033 Acute on chronic diastolic (congestive) heart failure: Secondary | ICD-10-CM | POA: Diagnosis not present

## 2021-01-23 DIAGNOSIS — J810 Acute pulmonary edema: Secondary | ICD-10-CM | POA: Diagnosis not present

## 2021-01-23 DIAGNOSIS — K219 Gastro-esophageal reflux disease without esophagitis: Secondary | ICD-10-CM | POA: Diagnosis present

## 2021-01-23 DIAGNOSIS — R0600 Dyspnea, unspecified: Secondary | ICD-10-CM | POA: Diagnosis not present

## 2021-01-23 DIAGNOSIS — J90 Pleural effusion, not elsewhere classified: Secondary | ICD-10-CM | POA: Diagnosis not present

## 2021-01-23 DIAGNOSIS — R531 Weakness: Secondary | ICD-10-CM | POA: Diagnosis not present

## 2021-01-23 DIAGNOSIS — I5032 Chronic diastolic (congestive) heart failure: Secondary | ICD-10-CM | POA: Diagnosis not present

## 2021-01-23 DIAGNOSIS — E860 Dehydration: Secondary | ICD-10-CM | POA: Diagnosis present

## 2021-01-23 DIAGNOSIS — I130 Hypertensive heart and chronic kidney disease with heart failure and stage 1 through stage 4 chronic kidney disease, or unspecified chronic kidney disease: Secondary | ICD-10-CM | POA: Diagnosis present

## 2021-01-23 DIAGNOSIS — R296 Repeated falls: Secondary | ICD-10-CM | POA: Diagnosis present

## 2021-01-23 DIAGNOSIS — B9620 Unspecified Escherichia coli [E. coli] as the cause of diseases classified elsewhere: Secondary | ICD-10-CM | POA: Diagnosis present

## 2021-01-23 DIAGNOSIS — N179 Acute kidney failure, unspecified: Secondary | ICD-10-CM | POA: Diagnosis not present

## 2021-01-23 DIAGNOSIS — J9601 Acute respiratory failure with hypoxia: Secondary | ICD-10-CM | POA: Diagnosis present

## 2021-01-23 DIAGNOSIS — I509 Heart failure, unspecified: Secondary | ICD-10-CM | POA: Diagnosis not present

## 2021-01-23 DIAGNOSIS — D6859 Other primary thrombophilia: Secondary | ICD-10-CM | POA: Diagnosis present

## 2021-01-23 DIAGNOSIS — R7303 Prediabetes: Secondary | ICD-10-CM | POA: Diagnosis present

## 2021-01-23 DIAGNOSIS — K589 Irritable bowel syndrome without diarrhea: Secondary | ICD-10-CM | POA: Diagnosis present

## 2021-01-23 DIAGNOSIS — Z96642 Presence of left artificial hip joint: Secondary | ICD-10-CM | POA: Diagnosis present

## 2021-01-23 DIAGNOSIS — R630 Anorexia: Secondary | ICD-10-CM | POA: Diagnosis present

## 2021-01-23 DIAGNOSIS — N3000 Acute cystitis without hematuria: Secondary | ICD-10-CM | POA: Diagnosis present

## 2021-01-23 DIAGNOSIS — R251 Tremor, unspecified: Secondary | ICD-10-CM | POA: Diagnosis present

## 2021-01-23 DIAGNOSIS — N1831 Chronic kidney disease, stage 3a: Secondary | ICD-10-CM | POA: Diagnosis present

## 2021-01-23 DIAGNOSIS — E785 Hyperlipidemia, unspecified: Secondary | ICD-10-CM | POA: Diagnosis present

## 2021-01-23 DIAGNOSIS — I4819 Other persistent atrial fibrillation: Secondary | ICD-10-CM | POA: Diagnosis not present

## 2021-01-23 DIAGNOSIS — Z20822 Contact with and (suspected) exposure to covid-19: Secondary | ICD-10-CM | POA: Diagnosis present

## 2021-01-23 DIAGNOSIS — I4510 Unspecified right bundle-branch block: Secondary | ICD-10-CM | POA: Diagnosis present

## 2021-01-23 LAB — CBC
HCT: 35.9 % — ABNORMAL LOW (ref 36.0–46.0)
Hemoglobin: 11.4 g/dL — ABNORMAL LOW (ref 12.0–15.0)
MCH: 29.4 pg (ref 26.0–34.0)
MCHC: 31.8 g/dL (ref 30.0–36.0)
MCV: 92.5 fL (ref 80.0–100.0)
Platelets: 165 10*3/uL (ref 150–400)
RBC: 3.88 MIL/uL (ref 3.87–5.11)
RDW: 14.6 % (ref 11.5–15.5)
WBC: 15.3 10*3/uL — ABNORMAL HIGH (ref 4.0–10.5)
nRBC: 0 % (ref 0.0–0.2)

## 2021-01-23 LAB — BASIC METABOLIC PANEL
Anion gap: 13 (ref 5–15)
BUN: 53 mg/dL — ABNORMAL HIGH (ref 8–23)
CO2: 22 mmol/L (ref 22–32)
Calcium: 8.6 mg/dL — ABNORMAL LOW (ref 8.9–10.3)
Chloride: 99 mmol/L (ref 98–111)
Creatinine, Ser: 2.13 mg/dL — ABNORMAL HIGH (ref 0.44–1.00)
GFR, Estimated: 22 mL/min — ABNORMAL LOW (ref >60)
Glucose, Bld: 198 mg/dL — ABNORMAL HIGH (ref 70–99)
Potassium: 3 mmol/L — ABNORMAL LOW (ref 3.5–5.1)
Sodium: 134 mmol/L — ABNORMAL LOW (ref 135–145)

## 2021-01-23 LAB — GLUCOSE, CAPILLARY
Glucose-Capillary: 169 mg/dL — ABNORMAL HIGH (ref 70–99)
Glucose-Capillary: 263 mg/dL — ABNORMAL HIGH (ref 70–99)
Glucose-Capillary: 195 mg/dL — ABNORMAL HIGH (ref 70–99)
Glucose-Capillary: 228 mg/dL — ABNORMAL HIGH (ref 70–99)

## 2021-01-23 LAB — TSH: TSH: 0.227 u[IU]/mL — ABNORMAL LOW (ref 0.350–4.500)

## 2021-01-23 MED ORDER — ENSURE ENLIVE PO LIQD
237.0000 mL | Freq: Two times a day (BID) | ORAL | Status: DC
  Administered 2021-01-23 – 2021-01-26 (×5): 237 mL via ORAL

## 2021-01-23 MED ORDER — POTASSIUM CHLORIDE 10 MEQ/100ML IV SOLN
10.0000 meq | INTRAVENOUS | Status: AC
  Administered 2021-01-23 (×2): 10 meq via INTRAVENOUS
  Filled 2021-01-23 (×2): qty 100

## 2021-01-23 MED ORDER — APIXABAN 2.5 MG PO TABS: 2.5000 mg | ORAL_TABLET | Freq: Two times a day (BID) | ORAL | Status: DC

## 2021-01-23 MED ORDER — APIXABAN 2.5 MG PO TABS
2.5000 mg | ORAL_TABLET | Freq: Two times a day (BID) | ORAL | Status: DC
  Administered 2021-01-23 – 2021-01-28 (×10): 2.5 mg via ORAL
  Filled 2021-01-23 (×10): qty 1

## 2021-01-23 MED ORDER — ADULT MULTIVITAMIN W/MINERALS CH
1.0000 | ORAL_TABLET | Freq: Every day | ORAL | Status: DC
  Administered 2021-01-23 – 2021-01-28 (×6): 1 via ORAL
  Filled 2021-01-23 (×6): qty 1

## 2021-01-23 MED ORDER — SODIUM CHLORIDE 0.9 % IV SOLN
1.0000 g | INTRAVENOUS | Status: DC
  Administered 2021-01-23 – 2021-01-25 (×3): 1 g via INTRAVENOUS
  Filled 2021-01-23 (×3): qty 1

## 2021-01-23 MED ORDER — TECHNETIUM TO 99M ALBUMIN AGGREGATED
4.4000 | Freq: Once | INTRAVENOUS | Status: AC
  Administered 2021-01-23: 4.4 via INTRAVENOUS

## 2021-01-23 MED ORDER — HYDRALAZINE HCL 20 MG/ML IJ SOLN: 10.0000 mg | INTRAMUSCULAR | Status: DC | PRN

## 2021-01-23 MED ORDER — POTASSIUM CHLORIDE 10 MEQ/100ML IV SOLN
10.0000 meq | INTRAVENOUS | Status: AC
Start: 2021-01-23 — End: 2021-01-23
  Administered 2021-01-23 (×4): 10 meq via INTRAVENOUS
  Filled 2021-01-23 (×4): qty 100

## 2021-01-23 MED ORDER — SODIUM CHLORIDE 0.9 % IV SOLN: INTRAVENOUS | Status: DC

## 2021-01-23 MED ORDER — POTASSIUM CHLORIDE CRYS ER 20 MEQ PO TBCR: 40.0000 meq | EXTENDED_RELEASE_TABLET | Freq: Once | ORAL | Status: DC

## 2021-01-23 NOTE — Progress Notes (Signed)
PT Cancellation Note  Patient Details Name: Briana [name] MRN: [id number] DOB: 01/14/1934   Cancelled Treatment:    Reason Eval/Treat Not Completed: Fatigue/lethargy limiting ability to participate (Per nursing, pt just returned from nuclear medicine and is very fatigued. They requested PT hold for now and let pt rest. Will follow.)  Philomena Doheny PT 01/23/2021  Acute Rehabilitation Services Pager [phone] Office [phone]

## 2021-01-23 NOTE — Progress Notes (Signed)
Initial Nutrition Assessment  INTERVENTION:   -Ensure Enlive po BID, each supplement provides 350 kcal and 20 grams of protein  -Multivitamin with minerals daily  NUTRITION DIAGNOSIS:   Inadequate oral intake related to poor appetite as evidenced by per patient/family report.  GOAL:   Patient will meet greater than or equal to 90% of their needs  MONITOR:   PO intake,Supplement acceptance,Labs,Weight trends,I & O's  REASON FOR ASSESSMENT:   Malnutrition Screening Tool    ASSESSMENT:   87 y.o. female with history of paroxysmal atrial fibrillation, hypertension, CHF was brought to the ER after patient found to be weak and had been having falls.  Over the last 2 weeks patient has had her Bumex dose increased twice due to increasing lower extremity edema. Was brought to the ER per denies any chest pain shortness of breath had some nausea vomiting about a week ago.   Patient in nuclear medicine. Per chart review, patient reporting some N/V over the past week and  Not eating/drinking well as a result. No PO documented for today yet.  Will order Ensure supplements for additional kcals and protein given poor PO recently.   Per weight records, pt's weight has remained stable 157-158 lbs.  Medications: IV KCl  Labs reviewed: CBGs: 157-169 Low Na, K  NUTRITION - FOCUSED PHYSICAL EXAM:  Unable to complete -will attempt at follow-up  Diet Order:   Diet Order            Diet heart healthy/carb modified Room service appropriate? Yes; Fluid consistency: Thin; Fluid restriction: 1200 mL Fluid  Diet effective now                 EDUCATION NEEDS:   No education needs have been identified at this time  Skin:  Skin Assessment: Reviewed RN Assessment  Last BM:  PTA  Height:   Ht Readings from Last 1 Encounters:  01/22/21 5\' 1"  (1.549 m)    Weight:   Wt Readings from Last 1 Encounters:  01/23/21 72.1 kg   BMI:  Body mass index is 30.03 kg/m.  Estimated  Nutritional Needs:   Kcal:  1400-1600  Protein:  60-75g  Fluid:  1.6L/day  Clayton Bibles, MS, RD, LDN Inpatient Clinical Dietitian Contact information available via Amion

## 2021-01-23 NOTE — Progress Notes (Signed)
PROGRESS NOTE   Briana [name]  JXB:[id number]    DOB: 02/28/1934    DOA: 01/22/2021  PCP: Unk Pinto, MD   I have briefly reviewed patients previous medical records in [facility].  Chief Complaint  Patient presents with  . Fall    Brief Narrative:  87 year old female, lives alone, ambulates with a walker x2 weeks, not on home oxygen, medical history significant for HTN, persistent atrial fibrillation, chronic diastolic CHF, GERD, hyperlipidemia presented to the ED due to weakness and falls.  Over the last 2 weeks, due to worsening leg edema/tightness and dyspnea, as per her physician instruction she took a couple days of extra doses of Bumex.  Admitted for acute on chronic kidney disease, transient hypoxia with associated generalized weakness and falls.   Assessment & Plan:  Principal Problem:   AKI (acute kidney injury) ([place]) Active Problems:   Essential hypertension   Unspecified atrial fibrillation (HCC)   Chronic diastolic heart failure (HCC)   Acquired thrombophilia ([place])   Acute kidney injury complicating CKD stage IIIa:  Baseline creatinine 0.9 in October 2021.  Presented with creatinine of 2.09.  Acute kidney injury suspected due to recent increased dose of Bumex, ARB and some GI losses.  Hold ARB.  Brief and cautious IV fluid hydration and follow daily BMP.  Renal ultrasound: No hydronephrosis.  Hypokalemia:  Replace and follow.  Magnesium 2.2.  Asymptomatic bacteriuria versus possible UTI  Continue ceftriaxone pending urine culture results.  Leukocytosis  May be reactive.?  Recent outpatient steroid use.  Follow CBC.  Generalized weakness/debility and falls  Secondary to dehydration, acute kidney injury complicating advanced age and frailty.  No syncope.  Therapies evaluation  Chronic diastolic CHF:  2D echo 11/19/863: LVEF 60-65%.  Requested updated 2D echo  Clinically dry but monitor closely while being hydrated IV.  Follows  with Dr. Claiborne Billings, Cardiology.  Persistent atrial fibrillation  Continue nadolol, amiodarone and pharmacy to adjust apixaban dose.  Currently in sinus rhythm  Transient hypoxia/acute hypoxic respiratory failure  D-dimer up to 2.44.  No evidence of pulmonary embolism on perfusion only scan.  Incentive spirometry and wean oxygen as tolerated.  Essential hypertension:  Continue nadolol, also for A. fib.  Holding ARB due to AKI and amlodipine.  Prolonged QTC  Difficult to accurately interpret in the context of BBB morphology.  Replace hypokalemia.  Magnesium 2.2.  Avoid QT prolonging medications.  Already on amiodarone.  Prediabetes  Follow CBGs and SSI if needed.  Multiple bilateral benign exophytic renal cysts  Noted on renal ultrasound and as per radiologist, no follow-up or characterization required.  Body mass index is 30.03 kg/m.  Nutritional Status Nutrition Problem: Inadequate oral intake Etiology: poor appetite Signs/Symptoms: per patient/family report Interventions: Ensure Enlive (each supplement provides 350kcal and 20 grams of protein),MVI    DVT prophylaxis:   On apixaban   Code Status: Full Code Family Communication: None at bedside Disposition:  Status is: Inpatient  Remains inpatient appropriate because:Inpatient level of care appropriate due to severity of illness   Dispo: The patient is from: Home              Anticipated d/c is to: TBD              Patient currently is not medically stable to d/c.   Difficult to place patient No        Consultants:   None  Procedures:   None  Antimicrobials:    Anti-infectives (From admission, onward)  Start     Dose/Rate Route Frequency Ordered Stop   01/23/21 0645  cefTRIAXone (ROCEPHIN) 1 g in sodium chloride 0.9 % 100 mL IVPB        1 g 200 mL/hr over 30 Minutes Intravenous Every 24 hours 01/23/21 0546          Subjective:  This morning.  Reports feeling better.  States that she  feels "less groggy" and stronger.  Denies dyspnea.  No chest pain, dizziness or lightheadedness.  Objective:   Vitals:   01/23/21 0533 01/23/21 0533 01/23/21 0902 01/23/21 1319  BP: (!) 100/46 (!) 100/46 127/72 (!) 120/59  Pulse: (!) 56 (!) 55 63 (!) 52  Resp: 18 18 16 20   Temp: 98.9 F (37.2 C) 98.9 F (37.2 C) 98.9 F (37.2 C) 98.2 F (36.8 C)  TempSrc: Oral Oral Oral Oral  SpO2: 96% 96% 98% 97%  Weight:      Height:        General exam: Pleasant elderly female, moderately built and thinly nourished, frail lying comfortably supine in bed without distress.  Oral mucosa dry. Respiratory system: Clear to auscultation. Respiratory effort normal. Cardiovascular system: S1 & S2 heard, RRR. No JVD, murmurs, rubs, gallops or clicks. No pedal edema. Gastrointestinal system: Abdomen is nondistended, soft and nontender. No organomegaly or masses felt. Normal bowel sounds heard. Central nervous system: Alert and oriented. No focal neurological deficits. Extremities: Symmetric 5 x 5 power. Skin: No rashes, lesions or ulcers Psychiatry: Judgement and insight appear normal. Mood & affect appropriate.     Data Reviewed:   I have personally reviewed following labs and imaging studies   CBC: Recent Labs  Lab 01/22/21 1745 01/23/21 0401  WBC 15.6* 15.3*  NEUTROABS 13.6*  --   HGB 11.8* 11.4*  HCT 36.0 35.9*  MCV 91.4 92.5  PLT 187 440    Basic Metabolic Panel: Recent Labs  Lab 01/22/21 1745 01/22/21 2054 01/23/21 0401  NA 137  --  134*  K 2.9*  --  3.0*  CL 101  --  99  CO2 26  --  22  GLUCOSE 195*  --  198*  BUN 51*  --  53*  CREATININE 2.09*  --  2.13*  CALCIUM 8.6*  --  8.6*  MG  --  2.2  --     Liver Function Tests: Recent Labs  Lab 01/22/21 1745  AST 34  ALT 35  ALKPHOS 93  BILITOT 1.7*  PROT 5.6*  ALBUMIN 3.0*    CBG: Recent Labs  Lab 01/22/21 2248 01/23/21 0811 01/23/21 1157  GLUCAP 157* 169* 263*    Microbiology Studies:   Recent Results  (from the past 240 hour(s))  Resp Panel by RT-PCR (Flu A&B, Covid) Nasopharyngeal Swab     Status: None   Collection Time: 01/22/21  8:04 PM   Specimen: Nasopharyngeal Swab; Nasopharyngeal(NP) swabs in vial transport medium  Result Value Ref Range Status   SARS Coronavirus 2 by RT PCR NEGATIVE NEGATIVE Final    Comment: (NOTE) SARS-CoV-2 target nucleic acids are NOT DETECTED.  The SARS-CoV-2 RNA is generally detectable in upper respiratory specimens during the acute phase of infection. The lowest concentration of SARS-CoV-2 viral copies this assay can detect is 138 copies/mL. A negative result does not preclude SARS-Cov-2 infection and should not be used as the sole basis for treatment or other patient management decisions. A negative result may occur with  improper specimen collection/handling, submission of specimen other than nasopharyngeal swab, presence  of viral mutation(s) within the areas targeted by this assay, and inadequate number of viral copies(<138 copies/mL). A negative result must be combined with clinical observations, patient history, and epidemiological information. The expected result is Negative.  Fact Sheet for Patients:  EntrepreneurPulse.com.au  Fact Sheet for Healthcare Providers:  IncredibleEmployment.be  This test is no t yet approved or cleared by the [place] FDA and  has been authorized for detection and/or diagnosis of SARS-CoV-2 by FDA under an Emergency Use Authorization (EUA). This EUA will remain  in effect (meaning this test can be used) for the duration of the COVID-19 declaration under Section 564(b)(1) of the Act, 21 U.S.C.section 360bbb-3(b)(1), unless the authorization is terminated  or revoked sooner.       Influenza A by PCR NEGATIVE NEGATIVE Final   Influenza B by PCR NEGATIVE NEGATIVE Final    Comment: (NOTE) The Xpert Xpress SARS-CoV-2/FLU/RSV plus assay is intended as an aid in the  diagnosis of influenza from Nasopharyngeal swab specimens and should not be used as a sole basis for treatment. Nasal washings and aspirates are unacceptable for Xpert Xpress SARS-CoV-2/FLU/RSV testing.  Fact Sheet for Patients: EntrepreneurPulse.com.au  Fact Sheet for Healthcare Providers: IncredibleEmployment.be  This test is not yet approved or cleared by the [place] FDA and has been authorized for detection and/or diagnosis of SARS-CoV-2 by FDA under an Emergency Use Authorization (EUA). This EUA will remain in effect (meaning this test can be used) for the duration of the COVID-19 declaration under Section 564(b)(1) of the Act, 21 U.S.C. section 360bbb-3(b)(1), unless the authorization is terminated or revoked.  Performed at [facility], [place] [address], [place], [place] [address]      Radiology Studies:  US RENAL  Result Date: February 09, 2021 CLINICAL DATA:  Acute kidney injury EXAM: RENAL / URINARY TRACT ULTRASOUND COMPLETE COMPARISON:  None. FINDINGS: Right Kidney: Renal measurements: 11.2 x 4.8 x 6.1 cm = volume: 174 mL. Echogenicity within normal limits. Multiple benign exophytic cysts. No mass or hydronephrosis visualized. Left Kidney: Renal measurements: 9.9 x 5.8 x 6.4 cm = volume: 190 mL. Echogenicity within normal limits. Multiple benign exophytic cysts. No mass or hydronephrosis visualized. Bladder: Appears normal for degree of bladder distention. Other: None. IMPRESSION: 1.  No significant abnormality identified. No hydronephrosis. 2. Multiple bilateral benign exophytic renal cysts, definitively benign, for which no further follow-up or characterization is required. Electronically Signed   By: [name] M.D.   On: 02/09/21 10:20   NM Pulmonary Perf and Vent  Result Date: 02-09-2021 CLINICAL DATA:  Dyspnea for several months with lytic sources. EXAM: NUCLEAR MEDICINE PERFUSION LUNG SCAN TECHNIQUE: Perfusion  images were obtained in multiple projections after intravenous injection of radiopharmaceutical. Ventilation scans intentionally deferred if perfusion scan and chest x-ray adequate for interpretation during COVID 19 epidemic. RADIOPHARMACEUTICALS:  4.4 mCi Tc-10m MAA IV COMPARISON:  Chest radiograph from one day prior. FINDINGS: No significant pulmonary perfusion defects. Inferior midline and medial left lower chest defect correlates with mild cardiomegaly as seen on chest radiograph. IMPRESSION: No evidence of pulmonary embolism on perfusion only scan. Electronically Signed   By: Ilona Sorrel M.D.   On: 02/09/2021 12:13   DG Chest Port 1 View  Result Date: 01/22/2021 CLINICAL DATA:  Weakness and shortness of breath, recent fall EXAM: PORTABLE CHEST 1 VIEW COMPARISON:  09/26/2019 FINDINGS: Cardiac shadow is enlarged but stable. Aortic calcifications are seen. Lungs are well aerated bilaterally. No focal infiltrate or sizable effusion is seen. Calcified granuloma in  the right base is noted. No acute bony abnormality is noted. IMPRESSION: No acute abnormality noted. Electronically Signed   By: [name] M.D.   On: 01/22/2021 18:50     Scheduled Meds:   . amiodarone  200 mg Oral Daily  . apixaban  5 mg Oral BID  . feeding supplement  237 mL Oral BID BM  . insulin aspart  0-6 Units Subcutaneous TID WC  . multivitamin with minerals  1 tablet Oral Daily  . nadolol  20 mg Oral Daily    Continuous Infusions:   . sodium chloride 75 mL/hr at 01/23/21 0130  . cefTRIAXone (ROCEPHIN)  IV 1 g (01/23/21 0615)     LOS: 0 days     Vernell Leep, MD, [place], [facility]    To contact the attending provider between 7A-7P or the covering provider during after hours 7P-7A, please log into the web site [email or web address] and access using universal [place] password for that web site. If you do not have the password, please call the hospital operator.  01/23/2021, 4:23 PM

## 2021-01-24 ENCOUNTER — Inpatient Hospital Stay (HOSPITAL_COMMUNITY): Payer: Medicare Other

## 2021-01-24 ENCOUNTER — Encounter (HOSPITAL_COMMUNITY): Payer: Self-pay | Admitting: Internal Medicine

## 2021-01-24 DIAGNOSIS — I5022 Chronic systolic (congestive) heart failure: Secondary | ICD-10-CM

## 2021-01-24 DIAGNOSIS — I5033 Acute on chronic diastolic (congestive) heart failure: Secondary | ICD-10-CM

## 2021-01-24 DIAGNOSIS — I480 Paroxysmal atrial fibrillation: Secondary | ICD-10-CM

## 2021-01-24 LAB — CBC
HCT: 33.2 % — ABNORMAL LOW (ref 36.0–46.0)
Hemoglobin: 10.8 g/dL — ABNORMAL LOW (ref 12.0–15.0)
MCH: 30.5 pg (ref 26.0–34.0)
MCHC: 32.5 g/dL (ref 30.0–36.0)
MCV: 93.8 fL (ref 80.0–100.0)
Platelets: 165 10*3/uL (ref 150–400)
RBC: 3.54 MIL/uL — ABNORMAL LOW (ref 3.87–5.11)
RDW: 14.5 % (ref 11.5–15.5)
WBC: 14.8 10*3/uL — ABNORMAL HIGH (ref 4.0–10.5)
nRBC: 0 % (ref 0.0–0.2)

## 2021-01-24 LAB — GLUCOSE, CAPILLARY
Glucose-Capillary: 209 mg/dL — ABNORMAL HIGH (ref 70–99)
Glucose-Capillary: 250 mg/dL — ABNORMAL HIGH (ref 70–99)
Glucose-Capillary: 206 mg/dL — ABNORMAL HIGH (ref 70–99)
Glucose-Capillary: 195 mg/dL — ABNORMAL HIGH (ref 70–99)

## 2021-01-24 LAB — HEMOGLOBIN A1C
Hgb A1c MFr Bld: 6.8 % — ABNORMAL HIGH (ref 4.8–5.6)
Mean Plasma Glucose: 148.46 mg/dL

## 2021-01-24 LAB — BASIC METABOLIC PANEL
Anion gap: 10 (ref 5–15)
BUN: 56 mg/dL — ABNORMAL HIGH (ref 8–23)
CO2: 21 mmol/L — ABNORMAL LOW (ref 22–32)
Calcium: 8.7 mg/dL — ABNORMAL LOW (ref 8.9–10.3)
Chloride: 103 mmol/L (ref 98–111)
Creatinine, Ser: 1.99 mg/dL — ABNORMAL HIGH (ref 0.44–1.00)
GFR, Estimated: 24 mL/min — ABNORMAL LOW (ref >60)
Glucose, Bld: 211 mg/dL — ABNORMAL HIGH (ref 70–99)
Potassium: 3.5 mmol/L (ref 3.5–5.1)
Sodium: 134 mmol/L — ABNORMAL LOW (ref 135–145)

## 2021-01-24 MED ORDER — NADOLOL 20 MG PO TABS
10.0000 mg | ORAL_TABLET | Freq: Every day | ORAL | Status: DC
  Administered 2021-01-24: 10 mg via ORAL
  Filled 2021-01-24: qty 1

## 2021-01-24 MED ORDER — SODIUM CHLORIDE 0.9 % IV SOLN: INTRAVENOUS | Status: DC

## 2021-01-24 MED ORDER — POTASSIUM CHLORIDE CRYS ER 20 MEQ PO TBCR
40.0000 meq | EXTENDED_RELEASE_TABLET | Freq: Once | ORAL | Status: AC
  Administered 2021-01-24: 40 meq via ORAL
  Filled 2021-01-24: qty 2

## 2021-01-24 MED ORDER — FUROSEMIDE 10 MG/ML IJ SOLN
40.0000 mg | Freq: Two times a day (BID) | INTRAMUSCULAR | Status: DC
  Administered 2021-01-24 – 2021-01-26 (×4): 40 mg via INTRAVENOUS
  Filled 2021-01-24 (×4): qty 4

## 2021-01-24 NOTE — Evaluation (Signed)
Physical Therapy Evaluation Patient Details Name: Briana [name] MRN: [id number] DOB: 04/03/1934 Today's Date: 01/24/2021   History of Present Illness  87 year old female, lives alone, ambulates with a walker x2 weeks, not on home oxygen, medical history significant for HTN, persistent atrial fibrillation, chronic diastolic CHF, GERD, hyperlipidemia presented to the ED 01/23/21 due to weakness and falls.  Over the last 2 weeks, due to worsening leg edema/tightness and dyspnea, Admitted for acute on chronic kidney disease, transient hypoxia with associated generalized weakness and falls.  Clinical Impression  The patient   Presents with generalized weakness. Patient ambulated x 20' and 60' using Rw. Patient on 3 L O2 with SPO2 100%. Resting On RA , SPO2 95%.  Patient may progress to return home with assistance.  Patient  Lives alone, states her neighbors help and has a daughter in vac[place]. If not available, may  Benefit from SNF.  Pt admitted with above diagnosis. Pt currently with functional limitations due to the deficits listed below (see PT Problem List). Pt will benefit from skilled PT to increase their independence and safety with mobility to allow discharge to the venue listed below.     Follow Up Recommendations Home health PT;Supervision/Assistance - 24 hour  Vs SNF if does not have 24/7 caregivers.   Equipment Recommendations  None recommended by PT    Recommendations for Other Services   OT    Precautions / Restrictions Precautions Precautions: Fall      Mobility  Bed Mobility Overal bed mobility: Needs Assistance Bed Mobility: Supine to Sit;Sidelying to Sit;Rolling Rolling: Supervision Sidelying to sit: Supervision       General bed mobility comments: patient  sat up withput assistnace, ruturned to supine with supervision,    Transfers Overall transfer level: Needs assistance Equipment used: Rolling walker (2 wheeled) Transfers: Sit to/from Stand Sit to Stand: Min  assist         General transfer comment: steady assist  upon standing from  bed and BSC/toilet, use of rail beside toilet  Ambulation/Gait Ambulation/Gait assistance: Min assist Gait Distance (Feet):  (then60') Assistive device: Rolling walker (2 wheeled) Gait Pattern/deviations: Step-through pattern;Decreased stride length Gait velocity: decr   General Gait Details: able to manuevre Rw around objects, slow speed.  Stairs            Wheelchair Mobility    Modified Rankin (Stroke Patients Only)       Balance Overall balance assessment: History of Falls;Needs assistance Sitting-balance support: No upper extremity supported;Feet supported Sitting balance-Leahy Scale: Good     Standing balance support: During functional activity;Bilateral upper extremity supported Standing balance-Leahy Scale: Fair Standing balance comment: props on elbows at sink while washed hands., needs RW to anmbulate                             Pertinent Vitals/Pain Pain Assessment: No/denies pain    Home Living Family/patient expects to be discharged to:: Private residence   Available Help at Discharge: Available PRN/intermittently;Family Type of Home: House Home Access: Stairs to enter Entrance Stairs-Rails: Right Entrance Stairs-Number of Steps: 2 Home Layout: One level Home Equipment: [place] - 4 wheels;Cane - quad Additional Comments: reports close neighbors can assist. and daughter    Prior Function Level of Independence: Independent with assistive device(s)         Comments: recently using RW, was driving until weakness     Hand Dominance   Dominant Hand: Right  Extremity/Trunk Assessment   Upper Extremity Assessment Upper Extremity Assessment: Generalized weakness    Lower Extremity Assessment Lower Extremity Assessment: Generalized weakness    Cervical / Trunk Assessment Cervical / Trunk Assessment: Kyphotic  Communication   Communication: No  difficulties  Cognition Arousal/Alertness: Awake/alert Behavior During Therapy: WFL for tasks assessed/performed Overall Cognitive Status: Within Functional Limits for tasks assessed                                        General Comments      Exercises     Assessment/Plan    PT Assessment Patient needs continued PT services  PT Problem List Decreased strength;Decreased mobility;Decreased safety awareness;Decreased knowledge of precautions;Decreased activity tolerance;Decreased balance;Decreased knowledge of use of DME       PT Treatment Interventions DME instruction;Therapeutic activities;Gait training;Therapeutic exercise;Patient/family education;Functional mobility training    PT Goals (Current goals can be found in the Care Plan section)  Acute Rehab PT Goals Patient Stated Goal: to walk more, it feels good PT Goal Formulation: With patient Time For Goal Achievement: 02/07/21 Potential to Achieve Goals: Good    Frequency Min 3X/week   Barriers to discharge        Co-evaluation               AM-PAC PT "6 Clicks" Mobility  Outcome Measure Help needed turning from your back to your side while in a flat bed without using bedrails?: None Help needed moving from lying on your back to sitting on the side of a flat bed without using bedrails?: None Help needed moving to and from a bed to a chair (including a wheelchair)?: A Little Help needed standing up from a chair using your arms (e.g., wheelchair or bedside chair)?: A Little Help needed to walk in hospital room?: A Little Help needed climbing 3-5 steps with a railing? : A Lot 6 Click Score: 19    End of Session Equipment Utilized During Treatment: Gait belt;Oxygen Activity Tolerance: Patient tolerated treatment well Patient left: in bed;with call bell/phone within reach;with bed alarm set Nurse Communication: Mobility status PT Visit Diagnosis: Unsteadiness on feet (R26.81);History of falling  (Z91.81);Difficulty in walking, not elsewhere classified (R26.2)    Time: 7998-7215 PT Time Calculation (min) (ACUTE ONLY): 39 min   Charges:   PT Evaluation $PT Eval Low Complexity: 1 Low PT Treatments $Gait Training: 8-22 mins $Self Care/Home Management: [place] Pager [phone] Office [phone]   Claretha Cooper 01/24/2021, 2:55 PM

## 2021-01-24 NOTE — Consult Note (Addendum)
Cardiology Consultation:   Patient ID: Briana [name] MRN: [id number]; DOB: 11-Mar-1934  Admit date: 01/22/2021 Date of Consult: 01/24/2021  PCP:  Unk Pinto, [facility]  Cardiologist:  Shelva Majestic, MD  Advanced Practice Provider:  No care team member to display Electrophysiologist:  None  :[id number]}    Patient Profile:   Briana [name] is a 87 y.o. female with a PMH of chronic diastolic CHF, paroxysmal atrial fibrillation, HTN, HLD, varicose veins, GERD who is being seen today for the evaluation of CHF at the request of Dr. Algis Liming.  History of Present Illness:   Ms. Sundberg contacted our office 01/09/21 with complaints of weight gain and LE edema. She was instructed to take an additional bumex at that time and as needed for weight gain of 2-3 lbs overnight or 5lbs in 1 week. She was scheduled to see Dr. Claiborne Billings 01/29/21. She called again 01/16/21 to report difficulty breathing and was again instructed to take additional bumex.   Unfortunately over the past 3 days she had increasing weakness and difficulty ambulating. A neighbor came to check on her 01/22/21 and found her on the floor, prompting EMS activation. She was hypoxic to 88%, improved on 2L O2 via [place]. On arrival to the ER she was tachypneic, otherwise VSS. Labs notable for K 2.9>3.5 with repletion, Cr 2.09>1.99 (baseline 0.9), WBC 15.6, Hgb 11.8>10.8 (baseline 13), PLT 187, HsTrop 28>27, CK wnl, BNP 464, TSH 0.22, A1C 6.8, Ddimer 2.44. COVID-19 negative. EKG with sinus rhythm, rate 60 bpm, chronic RBBB, no STE/D. CXR initial CXR with no acute findings. She had a NM perfusions lung scan which was negative for PE. She was admitted to medicine and started on IVF's given concerns for dehydration. She was started on IV antibiotics for possible UTI. Repeat CXR today showed mild CHF with trace bilateral pleural effusions. Cardiology asked to evaluate for CHF.   She reports worsening SOB this morning. Prior to  admission she had been struggling with LE edema, though states that had improved with increasing her bumex on 2 occasions. She then had significant weakness and unsteadiness causing her to fall a couple times. Falls did not result in any injury or head strike. On the last occasion she was unable to get up. A neighbor was unable to reach her and was able to get her daughter to check on her where she was found on the floor. She denies any chest pain, palpitations, dizziness, lightheadedness, or syncope. She denies recent orthopnea or PND. She continues to feel SOB now but notes her weakness is improving and she was able to walk down the hall with PT today.    Past Medical History:  Diagnosis Date  . Acute on chronic diastolic (congestive) heart failure ([place]) 11/21/2018  . Cancer ([place])    skin cancer  . DDD (degenerative disc disease)   . Diverticula of colon   . Diverticulitis   . DJD (degenerative joint disease)   . GERD (gastroesophageal reflux disease)    takes ranitidine  . Heart murmur   . History of hiatal hernia   . History of pneumonia   . Hx of irritable bowel syndrome   . Hyperlipidemia   . Occasional tremors   . Osteoporosis   . Pre-diabetes   . Varicose veins   . Vitamin D deficiency     Past Surgical History:  Procedure Laterality Date  . APPENDECTOMY    . BREAST EXCISIONAL BIOPSY Right   .  BREAST SURGERY Right    fluid drained from breast  . CARDIOVERSION N/A 12/20/2018   Procedure: CARDIOVERSION;  Surgeon: Buford Dresser, MD;  Location: [facility] ENDOSCOPY;  Service: Cardiovascular;  Laterality: N/A;  . CARDIOVERSION N/A 06/16/2019   Procedure: CARDIOVERSION;  Surgeon: Fay Records, MD;  Location: [place];  Service: Cardiovascular;  Laterality: N/A;  . COLONOSCOPY    . ESOPHAGOGASTRODUODENOSCOPY    . JOINT REPLACEMENT Left    left hip  . LUMBAR LAMINECTOMY/DECOMPRESSION MICRODISCECTOMY N/A 06/05/2015   Procedure: L3-L5 DECOMPRESSION ;  Surgeon: Melina Schools,  MD;  Location: [place];  Service: Orthopedics;  Laterality: N/A;  . OPEN REDUCTION INTERNAL FIXATION (ORIF) DISTAL RADIAL FRACTURE Right 01/10/2014   Procedure: OPEN REDUCTION INTERNAL FIXATION (ORIF) DISTAL RADIAL FRACTURE;  Surgeon: Linna Hoff, MD;  Location: [place];  Service: Orthopedics;  Laterality: Right;  . SPINAL CORD DECOMPRESSION  06/05/2015   L3 L 5  . THYROID SURGERY    . TONSILLECTOMY    . TUBAL LIGATION    . varicose veins stripped       Home Medications:  Prior to Admission medications   Medication Sig Start Date End Date Taking? Authorizing Provider  acetaminophen (TYLENOL) 500 MG tablet Take 500-1,000 mg by mouth every 6 (six) hours as needed for moderate pain.   Yes [provider]  amiodarone (PACERONE) 200 MG tablet Take 1 tablet (200 mg total) by mouth daily. 11/21/20  Yes Troy Sine, MD  amLODipine (NORVASC) 5 MG tablet Take 1.5 tablets (7.5 mg total) by mouth daily. 01/12/20 07/31/20 Yes Troy Sine, MD  apixaban (ELIQUIS) 5 MG TABS tablet Take 1 tablet (5 mg total) by mouth 2 (two) times daily. 08/19/20  Yes [name], NP  Ascorbic Acid (VITAMIN C) 1000 MG tablet Take 1,000 mg by mouth daily.    Yes [provider]  bumetanide (BUMEX) 1 MG tablet TAKE 1 TABLET BY MOUTH FIVE TIMES A WEEK ([place], TUESDAY, WEDNESDAY, [place], AND FRIDAY) Patient taking differently: Take 1 mg by mouth See admin instructions. Takes only 5 days a week; not on Saturday and Sunday. 11/29/20  Yes Troy Sine, MD  Carboxymeth-Glycerin-Polysorb (REFRESH DIGITAL OP) Place 1 drop into the right eye daily as needed (dry eyes).   Yes [provider]  cholecalciferol (VITAMIN D3) 25 MCG (1000 UT) tablet Take 1,000 Units by mouth 2 (two) times daily.   Yes [provider]  Misc Natural Products (OSTEO BI-FLEX JOINT SHIELD PO) Take 1 tablet by mouth 2 (two) times daily.    Yes [provider]  Multiple Vitamins-Minerals (PRESERVISION AREDS 2  PO) Take 1 capsule by mouth 2 (two) times daily.   Yes [provider]  nadolol (CORGARD) 20 MG tablet Take 0.5 tablets (10 mg total) by mouth daily. Take 1 tablet Daily for BP & Tremor Patient taking differently: Take 20 mg by mouth daily. 02/26/20  Yes Cleaver, Jossie Ng, NP  olmesartan (BENICAR) 40 MG tablet Take 1 tablet Daily for BP Patient taking differently: Take 40 mg by mouth daily. 02/28/20  Yes Unk Pinto, MD  Olopatadine HCl (PATADAY OP) Place 1 drop into the left eye daily.   Yes [provider]  PROAIR HFA 108 (90 Base) MCG/ACT inhaler Use 2 Inhalations 15 minutes apart every 4 hours if needed to Rescue Asthma Attack Patient taking differently: Inhale 2 puffs into the lungs every 6 (six) hours as needed for wheezing or shortness of breath. 04/24/20  Yes Unk Pinto,  MD  promethazine-dextromethorphan (PROMETHAZINE-DM) 6.25-15 MG/5ML syrup Take 5 mLs by mouth 4 (four) times daily as needed for cough. 11/13/20  Yes [name], NP  Pyridoxine HCl (VITAMIN B-6 PO) Take 1 capsule by mouth daily.   Yes [provider]  zinc gluconate 50 MG tablet Take 50 mg by mouth daily.   Yes [provider]  dexamethasone (DECADRON) 0.5 MG tablet Take 1 tablet PO BID for 5 days and then 1 tablet PO QDaily for 5 days. Patient not taking: No sig reported 11/13/20   [name], NP    Inpatient Medications: Scheduled Meds: . amiodarone  200 mg Oral Daily  . apixaban  2.5 mg Oral BID  . feeding supplement  237 mL Oral BID BM  . furosemide  40 mg Intravenous Q12H  . insulin aspart  0-6 Units Subcutaneous TID WC  . multivitamin with minerals  1 tablet Oral Daily  . potassium chloride  40 mEq Oral Once   Continuous Infusions: . cefTRIAXone (ROCEPHIN)  IV Stopped (01/24/21 0654)   PRN Meds: albuterol  Allergies:    Allergies  Allergen Reactions  . Accupril [Quinapril Hcl] Cough  . Neosporin [Neomycin-Bacitracin Zn-Polymyx]     unknown  .  Prednisone     Agitated and shaky  . Reglan [Metoclopramide] Other (See Comments)    tremors  . Tramadol Nausea And Vomiting  . Adhesive [Tape] Rash    Social History:   Social History   Socioeconomic History  . Marital status: Widowed    Spouse name: Not on file  . Number of children: Not on file  . Years of education: Not on file  . Highest education level: Not on file  Occupational History  . Not on file  Tobacco Use  . Smoking status: Never Smoker  . Smokeless tobacco: Never Used  Substance and Sexual Activity  . Alcohol use: No  . Drug use: No  . Sexual activity: Not on file  Other Topics Concern  . Not on file  Social History Narrative  . Not on file   Social Determinants of Health   Financial Resource Strain: Not on file  Food Insecurity: Not on file  Transportation Needs: Not on file  Physical Activity: Not on file  Stress: Not on file  Social Connections: Not on file  Intimate Partner Violence: Not on file    Family History:    Family History  Problem Relation Age of Onset  . Hypertension Mother   . Hyperlipidemia Mother   . Heart disease Sister   . Cancer Brother        prostate     ROS:  Please see the history of present illness.   All other ROS reviewed and negative.     Physical Exam/Data:   Vitals:   01/23/21 2049 01/24/21 0409 01/24/21 0431 01/24/21 0855  BP: 98/79 (!) 106/55  (!) 119/54  Pulse: (!) 57 (!) 53  (!) 51  Resp: '20 20  20  ' Temp: 99.6 F (37.6 C) 98.6 F (37 C)  98.5 F (36.9 C)  TempSrc: Oral Oral  Oral  SpO2: 90% 95%    Weight:   74.9 kg   Height:        Intake/Output Summary (Last 24 hours) at 01/24/2021 1608 Last data filed at 01/24/2021 0811 Gross per 24 hour  Intake 1538.6 ml  Output 500 ml  Net 1038.6 ml   Last 3 Weights 01/24/2021 01/23/2021 01/22/2021  Weight (lbs) 165 lb 1.6 oz 158  lb 15.2 oz 163 lb 2.3 oz  Weight (kg) 74.889 kg 72.1 kg 74 kg     Body mass index is 31.2 kg/m.  General:  Pleasant  elderly female laying in bed in NAD HEENT: dry mucus membranes, sclera anicteric Neck: no JVD Vascular: No carotid bruits; distal pulses 2+ bilaterally  Cardiac:  normal S1, S2; RRR; +murmur, no rubs or gallops Lungs:  Faint crackles at lung bases, no wheezing/rhonchi Abd: soft, nontender, no hepatomegaly  Ext: no edema Musculoskeletal:  No deformities, BUE and BLE strength normal and equal Skin: warm and dry  Neuro:  CNs 2-12 intact, no focal abnormalities noted Psych:  Normal affect   EKG:  The EKG was personally reviewed and demonstrates:  sinus rhythm, rate 60 bpm, chronic RBBB, no STE/D Telemetry:  Telemetry was personally reviewed and demonstrates:  Not on telemetry  Relevant CV Studies: Echocardiogram 11/2018: - Procedure narrative: Transthoracic echocardiography. Image  quality was adequate. The study was technically difficult.  - Left ventricle: The cavity size was normal. Wall thickness was  increased in a pattern of mild LVH. Systolic function was normal.  The estimated ejection fraction was in the range of 60% to 65%.  Wall motion was normal; there were no regional wall motion  abnormalities. The study was not technically sufficient to allow  evaluation of LV diastolic dysfunction due to atrial  fibrillation.  - Aortic valve: There was trivial regurgitation directed  eccentrically in the LVOT and towards the mitral anterior  leaflet.  - Left atrium: The atrium was mildly dilated.  - Pulmonic valve: There was trivial regurgitation.  - Pericardium, extracardiac: A trivial, free-flowing pericardial  effusion was identified circumferential to the heart.  NST 2018:  Nuclear stress EF: 70%.  There was no ST segment deviation noted during stress.  The study is normal.  The left ventricular ejection fraction is hyperdynamic (>65%).   1. EF 70%, normal wall motion.  2. No evidence for ischemia or infarction.   Normal study.    Laboratory  Data:  High Sensitivity Troponin:   Recent Labs  Lab 01/22/21 1745 01/22/21 2054  TROPONINIHS 28* 27*     Chemistry Recent Labs  Lab 01/22/21 1745 01-26-21 0401 01/24/21 0349  NA 137 134* 134*  K 2.9* 3.0* 3.5  CL 101 99 103  CO2 26 22 21*  GLUCOSE 195* 198* 211*  BUN 51* 53* 56*  CREATININE 2.09* 2.13* 1.99*  CALCIUM 8.6* 8.6* 8.7*  GFRNONAA 23* 22* 24*  ANIONGAP '10 13 10    ' Recent Labs  Lab 01/22/21 1745  PROT 5.6*  ALBUMIN 3.0*  AST 34  ALT 35  ALKPHOS 93  BILITOT 1.7*   Hematology Recent Labs  Lab 01/22/21 1745 2021/01/26 0401 01/24/21 0349  WBC 15.6* 15.3* 14.8*  RBC 3.94 3.88 3.54*  HGB 11.8* 11.4* 10.8*  HCT 36.0 35.9* 33.2*  MCV 91.4 92.5 93.8  MCH 29.9 29.4 30.5  MCHC 32.8 31.8 32.5  RDW 14.4 14.6 14.5  PLT 187 165 165   BNP Recent Labs  Lab 01/22/21 1745  BNP 464.4*    DDimer  Recent Labs  Lab 01/22/21 2054  DDIMER 2.44*     Radiology/Studies:  US RENAL  Result Date: 01/26/21 CLINICAL DATA:  Acute kidney injury EXAM: RENAL / URINARY TRACT ULTRASOUND COMPLETE COMPARISON:  None. FINDINGS: Right Kidney: Renal measurements: 11.2 x 4.8 x 6.1 cm = volume: 174 mL. Echogenicity within normal limits. Multiple benign exophytic cysts. No mass or hydronephrosis visualized. Left  Kidney: Renal measurements: 9.9 x 5.8 x 6.4 cm = volume: 190 mL. Echogenicity within normal limits. Multiple benign exophytic cysts. No mass or hydronephrosis visualized. Bladder: Appears normal for degree of bladder distention. Other: None. IMPRESSION: 1.  No significant abnormality identified. No hydronephrosis. 2. Multiple bilateral benign exophytic renal cysts, definitively benign, for which no further follow-up or characterization is required. Electronically Signed   By: [name] M.D.   On: 01/23/2021 10:20   NM Pulmonary Perf and Vent  Result Date: 01/23/2021 CLINICAL DATA:  Dyspnea for several months with lytic sources. EXAM: NUCLEAR MEDICINE PERFUSION LUNG SCAN  TECHNIQUE: Perfusion images were obtained in multiple projections after intravenous injection of radiopharmaceutical. Ventilation scans intentionally deferred if perfusion scan and chest x-ray adequate for interpretation during COVID 19 epidemic. RADIOPHARMACEUTICALS:  4.4 mCi Tc-35mMAA IV COMPARISON:  Chest radiograph from one day prior. FINDINGS: No significant pulmonary perfusion defects. Inferior midline and medial left lower chest defect correlates with mild cardiomegaly as seen on chest radiograph. IMPRESSION: No evidence of pulmonary embolism on perfusion only scan. Electronically Signed   By: JIlona SorrelM.D.   On: 01/23/2021 12:13   DG CHEST PORT 1 VIEW  Result Date: 01/24/2021 CLINICAL DATA:  Dyspnea EXAM: PORTABLE CHEST 1 VIEW COMPARISON:  01/22/2021 chest radiograph. FINDINGS: Stable cardiomediastinal silhouette with mild cardiomegaly. No pneumothorax. Trace bilateral pleural effusions. Stable calcified 8 mm right costophrenic angle granuloma. Mild diffuse prominence and haziness of the parahilar interstitial markings, suggesting mild pulmonary edema. IMPRESSION: Mild congestive heart failure with trace bilateral pleural effusions. Electronically Signed   By: JIlona SorrelM.D.   On: 01/24/2021 09:35   DG Chest Port 1 View  Result Date: 01/22/2021 CLINICAL DATA:  Weakness and shortness of breath, recent fall EXAM: PORTABLE CHEST 1 VIEW COMPARISON:  09/26/2019 FINDINGS: Cardiac shadow is enlarged but stable. Aortic calcifications are seen. Lungs are well aerated bilaterally. No focal infiltrate or sizable effusion is seen. Calcified granuloma in the right base is noted. No acute bony abnormality is noted. IMPRESSION: No acute abnormality noted. Electronically Signed   By: MInez CatalinaM.D.   On: 01/22/2021 18:50     Assessment and Plan:   1. Acute on chronic diastolic CHF: patient presented with weakness and fall. Recently struggling with LE edema, for which she had taken extra bumex on 2  occasions in the past 2 weeks. Cr elevated to 2.09 from baseline 0.9. Initially felt to be dehydrated and given IVF. BNP was 464 on admission, up from prior admission for CHF. Initial CXR clear, however repeat today with mild pulmonary vascular congestion. Also with increased SOB this morning. Echo read pending - Will start lasix IV 459mBID - suspect Cr may improve with diuresis  - Continue to hold nadolol given bradycardia this admission - Continue to monitor strict I&Os and daily weights - Continue to monitor electrolytes closely and replete as needed to maintain K >4, Mg >2 - will give potassium 40 mEq now in anticipation of lasix this afternoon  2. AKI: Cr 2.09 on admission, down to 1.99 after receiving IVFs; baseline is 0.9. Home olmesartan on hold - Continue to monitor closely - hopeful Cr will improve with diuresis  3. Paroxysmal atrial fibrillation: EKG on admission with sinus rhythm and exam suggests the same. She is not currently on telemetry. She has been bradycardic to the low 50s.  - Continue to hold nadolol - Continue amiodarone - Agree with renal adjustment of apixaban in the setting of AKI  for stroke ppx  4. HTN: BP has been intermittently soft. Home amlodipine and olmesartan on hold.  - Continue to monitor closely with diuresis.   5. UTI: Ecoli on UCx - Continue antibiotics per primary team  Risk Assessment/Risk Scores:        New York Heart Association (NYHA) Functional Class NYHA Class III  CHA2DS2-VASc Score = 5  This indicates a 7.2% annual risk of stroke. The patient's score is based upon: CHF History: Yes HTN History: Yes Diabetes History: No Stroke History: No Vascular Disease History: No Age Score: 2 Gender Score: 1         For questions or updates, please contact [place] Please consult [email or web address] for contact info under    Signed, Abigail Butts, PA-C  01/24/2021 4:08 PM  I have seen and examined the patient along with Abigail Butts, PA-C .  I have reviewed the chart, notes and new data.  I agree with PA/NP's note.  Key new complaints: short of breath, orthopneic, no angina Key examination changes: a few bilateral basal lung crackles. JVD 8 cm sitting up. No peripheral edema. Key new findings / data: reviewed her echo - LVEF normal, severely dilated LA, almost absent A wave c/w atrial mechanical failure (stunning due to recent AFib?), markedly diastolic dominant pulmonary vein flow, moderate central MR, elevated PAP, dilated IVC. BNP is mildly elevated at 464, c/w 394 in 2020. UCx +ve >100,000 colonies.  PLAN: Most of the clinical and echo findings suggest HF exacerbation, possibly due to UTI and likely a recent episode of AFib, since rewsolved. Diuretics have been ordered. Need to carefully monitor renal function. She is quite bradycardic. Nadolol and olmesartan on hold. Some conflicting evidence of volume depletion, will check ESR to see if we can exclude the (unlikely) possibility of amio lung toxicity (CHF seems much more probable)  Sanda Klein, MD, [facility] [phone] 01/24/2021, 4:59 PM

## 2021-01-24 NOTE — Plan of Care (Signed)

## 2021-01-24 NOTE — Consult Note (Signed)
Renal Service Consult Note [facility]  Briana [name] 01/24/2021 Sol Blazing, MD Requesting Physician: Dr. Algis Liming  Reason for Consult: Renal failure HPI: The patient is a 87 y.o. year-old w/ hx of chron diast HF, atrial fib, HTN, HL, back surgery and joint replacement surgery. Pt developed gen weakness and was falling at home and presented to the ED. She has had her Bumex increased twice recently due to worsening LE edema. Now she is having difficulty walking by herself. Was found 2x on the floor in prior 24 hrs. EMS found low sat of 88% on RA, imrpoved on 2L [place]. In ED lab showed creat 2.09 , baseline 0.90 from Oct 2021.  WBC was 14E , BP 315 systolic, EKG okay and BNP 467.  Pt admitted for AKI and gen weakness. Bumex and losartan were held and IVF"s started. UA looked dirty and ceftriaxone was started. Today UCx is +. Nadolol continued for afib rate control. ECHO ordered.  Today she is SOB and CXR shows pulm edema. IVF"s stopped. Creat no better at 1.9.  Asked to see for renal failure and vol management.   Pt seen in room, BP's 119/54, HR 50- 60, RR 20. Pt coughing off and on, no productive cough. No severe SOB. Ankle swelling of late has resolved per the family now. Per dtr she has had cardioversion 2x for heart in OP setting , but not a lot of hospital admissions.    Pt denies any abd pain, n/v/d, voiding issues.     ROS  denies CP  no joint pain   no HA  no blurry vision  no rash  no diarrhea  no nausea/ vomiting    Past Medical History  Past Medical History:  Diagnosis Date  . Acute on chronic diastolic (congestive) heart failure ([place]) 11/21/2018  . Cancer ([place])    skin cancer  . DDD (degenerative disc disease)   . Diverticula of colon   . Diverticulitis   . DJD (degenerative joint disease)   . GERD (gastroesophageal reflux disease)    takes ranitidine  . Heart murmur   . History of hiatal hernia   . History of pneumonia   . Hx of irritable bowel  syndrome   . Hyperlipidemia   . Occasional tremors   . Osteoporosis   . Pre-diabetes   . Varicose veins   . Vitamin D deficiency    Past Surgical History  Past Surgical History:  Procedure Laterality Date  . APPENDECTOMY    . BREAST EXCISIONAL BIOPSY Right   . BREAST SURGERY Right    fluid drained from breast  . CARDIOVERSION N/A 12/20/2018   Procedure: CARDIOVERSION;  Surgeon: Buford Dresser, MD;  Location: [facility] ENDOSCOPY;  Service: Cardiovascular;  Laterality: N/A;  . CARDIOVERSION N/A 06/16/2019   Procedure: CARDIOVERSION;  Surgeon: Fay Records, MD;  Location: [place];  Service: Cardiovascular;  Laterality: N/A;  . COLONOSCOPY    . ESOPHAGOGASTRODUODENOSCOPY    . JOINT REPLACEMENT Left    left hip  . LUMBAR LAMINECTOMY/DECOMPRESSION MICRODISCECTOMY N/A 06/05/2015   Procedure: L3-L5 DECOMPRESSION ;  Surgeon: Melina Schools, MD;  Location: [place];  Service: Orthopedics;  Laterality: N/A;  . OPEN REDUCTION INTERNAL FIXATION (ORIF) DISTAL RADIAL FRACTURE Right 01/10/2014   Procedure: OPEN REDUCTION INTERNAL FIXATION (ORIF) DISTAL RADIAL FRACTURE;  Surgeon: Linna Hoff, MD;  Location: [place];  Service: Orthopedics;  Laterality: Right;  . SPINAL CORD DECOMPRESSION  06/05/2015   L3 L 5  . THYROID  SURGERY    . TONSILLECTOMY    . TUBAL LIGATION    . varicose veins stripped     Family History  Family History  Problem Relation Age of Onset  . Hypertension Mother   . Hyperlipidemia Mother   . Heart disease Sister   . Cancer Brother        prostate   Social History  reports that she has never smoked. She has never used smokeless tobacco. She reports that she does not drink alcohol and does not use drugs. Allergies  Allergies  Allergen Reactions  . Accupril [Quinapril Hcl] Cough  . Neosporin [Neomycin-Bacitracin Zn-Polymyx]     unknown  . Prednisone     Agitated and shaky  . Reglan [Metoclopramide] Other (See Comments)    tremors  . Tramadol Nausea And Vomiting  .  Adhesive [Tape] Rash   Home medications Prior to Admission medications   Medication Sig Start Date End Date Taking? Authorizing Provider  acetaminophen (TYLENOL) 500 MG tablet Take 500-1,000 mg by mouth every 6 (six) hours as needed for moderate pain.   Yes [provider]  amiodarone (PACERONE) 200 MG tablet Take 1 tablet (200 mg total) by mouth daily. 11/21/20  Yes Troy Sine, MD  amLODipine (NORVASC) 5 MG tablet Take 1.5 tablets (7.5 mg total) by mouth daily. 01/12/20 07/31/20 Yes Troy Sine, MD  apixaban (ELIQUIS) 5 MG TABS tablet Take 1 tablet (5 mg total) by mouth 2 (two) times daily. 08/19/20  Yes [name], NP  Ascorbic Acid (VITAMIN C) 1000 MG tablet Take 1,000 mg by mouth daily.    Yes [provider]  bumetanide (BUMEX) 1 MG tablet TAKE 1 TABLET BY MOUTH FIVE TIMES A WEEK ([place], TUESDAY, WEDNESDAY, [place], AND FRIDAY) Patient taking differently: Take 1 mg by mouth See admin instructions. Takes only 5 days a week; not on Saturday and Sunday. 11/29/20  Yes Troy Sine, MD  Carboxymeth-Glycerin-Polysorb (REFRESH DIGITAL OP) Place 1 drop into the right eye daily as needed (dry eyes).   Yes [provider]  cholecalciferol (VITAMIN D3) 25 MCG (1000 UT) tablet Take 1,000 Units by mouth 2 (two) times daily.   Yes [provider]  Misc Natural Products (OSTEO BI-FLEX JOINT SHIELD PO) Take 1 tablet by mouth 2 (two) times daily.    Yes [provider]  Multiple Vitamins-Minerals (PRESERVISION AREDS 2 PO) Take 1 capsule by mouth 2 (two) times daily.   Yes [provider]  nadolol (CORGARD) 20 MG tablet Take 0.5 tablets (10 mg total) by mouth daily. Take 1 tablet Daily for BP & Tremor Patient taking differently: Take 20 mg by mouth daily. 02/26/20  Yes Cleaver, Jossie Ng, NP  olmesartan (BENICAR) 40 MG tablet Take 1 tablet Daily for BP Patient taking differently: Take 40 mg by mouth daily. 02/28/20  Yes Unk Pinto, MD   Olopatadine HCl (PATADAY OP) Place 1 drop into the left eye daily.   Yes [provider]  PROAIR HFA 108 (90 Base) MCG/ACT inhaler Use 2 Inhalations 15 minutes apart every 4 hours if needed to Rescue Asthma Attack Patient taking differently: Inhale 2 puffs into the lungs every 6 (six) hours as needed for wheezing or shortness of breath. 04/24/20  Yes Unk Pinto, MD  promethazine-dextromethorphan (PROMETHAZINE-DM) 6.25-15 MG/5ML syrup Take 5 mLs by mouth 4 (four) times daily as needed for cough. 11/13/20  Yes [name], NP  Pyridoxine HCl (VITAMIN B-6 PO) Take 1 capsule by mouth daily.  Yes [provider]  zinc gluconate 50 MG tablet Take 50 mg by mouth daily.   Yes [provider]  dexamethasone (DECADRON) 0.5 MG tablet Take 1 tablet PO BID for 5 days and then 1 tablet PO QDaily for 5 days. Patient not taking: No sig reported 11/13/20   [name], NP     Vitals:   01/23/21 2049 01/24/21 0409 01/24/21 0431 01/24/21 0855  BP: 98/79 (!) 106/55  (!) 119/54  Pulse: (!) 57 (!) 53  (!) 51  Resp: 20 20  20   Temp: 99.6 F (37.6 C) 98.6 F (37 C)  98.5 F (36.9 C)  TempSrc: Oral Oral  Oral  SpO2: 90% 95%    Weight:   74.9 kg   Height:       Exam Gen elderly WF , coughing off an on, lying mostly flat, nad No rash, cyanosis or gangrene Sclera anicteric, throat clear  +JVD Chest bilat rales at bases RRR no MRG Abd soft ntnd no mass or ascites +bs GU  defer MS no joint effusions or deformity Ext no pitting LE or hip edema, no wounds or ulcer Neuro is alert, Ox 3 , nf     Home meds:  - norvasc 7.5 qd/ bumex 1mg  on m-f / nadolol 10-20 mg qd/ benicar 40 qd  - eliquis 5 bid/ amio 200 qd  - prn's/ vitamins/ supplements    UA 3/9 - 100 prot, few bac, >50 wbc, 0-5 rbc   UCx +>100,000 EColi    Renal US 3/10 - 10- 12 cm kidneys no hydro.   Assessment/ Plan: 1. AKI - baseline creat 0.9 from Oct 2021.  Pt admitted for falls and possible  dehydration, given IVF's and now has signs of pulm edema. IVF"s stopped. Not sure cause of AKI, decomp CHF, new cardiac issues (echo planned for today), intrinsic renal unlikely w/ min protein/ rbc on UA.  No signs of obstruction per [place]. No hx of renal failure. +hx diast CHF. Suspect hemodynamic related to CHF , hemodynamic, ARB, borderline hypotension. Plan IV lasix 40 bid for now. Hold nadolol. F/u echo results. Will follow.  2. EColi UTI - getting Rocephin.  3. Pulm edema - IV lasix as above 4. Atrial fib - long hx per family , on eliquis and BB. Would hold BB given low HR and would like BP's a bit higher for renal perfusion.       Kelly Splinter  MD 01/24/2021, 2:59 PM  Recent Labs  Lab 01/23/21 0401 01/24/21 0349  WBC 15.3* 14.8*  HGB 11.4* 10.8*   Recent Labs  Lab 01/23/21 0401 01/24/21 0349  K 3.0* 3.5  BUN 53* 56*  CREATININE 2.13* 1.99*  CALCIUM 8.6* 8.7*

## 2021-01-24 NOTE — Progress Notes (Signed)
  Echocardiogram 2D Echocardiogram has been performed.  Briana [name] 01/24/2021, 3:32 PM

## 2021-01-24 NOTE — Progress Notes (Signed)
PROGRESS NOTE   Briana [name]  HER:[id number]    DOB: [birth year]    DOA: 01/22/2021  PCP: Unk Pinto, MD   I have briefly reviewed patients previous medical records in [facility].  Chief Complaint  Patient presents with  . Fall    Brief Narrative:  87 year old female, lives alone, ambulates with a walker x2 weeks, not on home oxygen, medical history significant for HTN, persistent atrial fibrillation, chronic diastolic CHF, GERD, hyperlipidemia presented to the ED due to weakness and falls.  Over the last 2 weeks, due to worsening leg edema/tightness and dyspnea, as per her physician instruction she took a couple days of extra doses of Bumex.  Admitted for acute on chronic kidney disease, transient hypoxia with associated generalized weakness and falls.  Treated with IV antibiotics for about 24 hours with minimal improvement in creatinine but developed worsening dyspnea due to CHF.  IV fluids discontinued.  Cardiology and nephrology consulted.   Assessment & Plan:  Principal Problem:   AKI (acute kidney injury) ([place]) Active Problems:   Essential hypertension   Unspecified atrial fibrillation (HCC)   Chronic diastolic heart failure (HCC)   Acquired thrombophilia ([place])   Acute kidney injury complicating CKD stage IIIa:  Baseline creatinine 0.9 in October 2021.  Presented with creatinine of 2.09.  Acute kidney injury suspected due to recent increased dose of Bumex, ARB and some GI losses.  ARB held.   Renal ultrasound: No hydronephrosis.  Hydrated briefly for about 24 hours with IV fluids.  Creatinine marginally improved from 2.13-1.99.  Patient however dyspneic this morning.  Although not much peripheral edema, concern for decompensated CHF on clinical exam and chest x-ray.  IV fluids discontinued.  Nephrology consulted.  Started IV Lasix 40 mg every 12 hours.  Hypokalemia:  Replaced.  Magnesium 2.2.  Suspected E. coli UTI  Urine culture shows >100 K  colonies of E. coli, sensitivities pending.  DC IV ceftriaxone after day 3 today.  Did have some dysuria PTA she says but seems to have resolved.  Also reported chills PTA.  Leukocytosis  May be reactive.?  Recent outpatient steroid use.  Follow CBC in a.m.  Generalized weakness/debility and falls  Secondary to dehydration, acute kidney injury complicating advanced age and frailty.  No syncope.  Therapies evaluation pending  Acute on chronic diastolic CHF:  2D echo 06/20/6313: LVEF 60-65%.  Requested updated 2D echo-pending  Follows with Dr. Claiborne Billings, Cardiology.  As noted above, diuretics held on admission due to AKI and dehydration, briefly hydrated with IV fluids which might have pushed her into CHF again.  Brittle to control volume status it appears.  Also need to see if echo shows any worsening EF compared to prior.  Cardiology consulted.  IV Lasix as above but nephrology  Persistent atrial fibrillation  Continue nadolol, amiodarone and reduced dose of apixaban due to AKI.  Currently in sinus rhythm  Reduced nadolol dose from 20 mg daily to 10 mg daily due to bradycardia in the 50s.  Transient hypoxia/acute hypoxic respiratory failure  D-dimer up to 2.44.  No evidence of pulmonary embolism on perfusion only scan.  Incentive spirometry and wean oxygen as tolerated.  Essential hypertension:  Continue nadolol at reduced dose, also for A. fib.  Holding ARB due to AKI and amlodipine.  Prolonged QTC  Difficult to accurately interpret in the context of BBB morphology.  Replaced hypokalemia.  Magnesium 2.2.  Avoid QT prolonging medications.  Already on amiodarone.  Prediabetes  Follow CBGs  and SSI if needed.  Multiple bilateral benign exophytic renal cysts  Noted on renal ultrasound and as per radiologist, no follow-up or characterization required.  Body mass index is 31.2 kg/m.  Nutritional Status Nutrition Problem: Inadequate oral  intake Etiology: poor appetite Signs/Symptoms: per patient/family report Interventions: Ensure Enlive (each supplement provides 350kcal and 20 grams of protein),MVI    DVT prophylaxis: apixaban (ELIQUIS) tablet 2.5 mg Start: 01/23/21 2200  On apixaban   Code Status: Full Code Family Communication: I discussed with patient's daughter via phone, updated care and answered questions. Disposition:  Status is: Inpatient  Remains inpatient appropriate because:Inpatient level of care appropriate due to severity of illness   Dispo: The patient is from: Home              Anticipated d/c is to: TBD              Patient currently is not medically stable to d/c.   Difficult to place patient No        Consultants:   Nephrology Cardiology  Procedures:   None  Antimicrobials:    Anti-infectives (From admission, onward)   Start     Dose/Rate Route Frequency Ordered Stop   01/23/21 0645  cefTRIAXone (ROCEPHIN) 1 g in sodium chloride 0.9 % 100 mL IVPB        1 g 200 mL/hr over 30 Minutes Intravenous Every 24 hours 01/23/21 0546          Subjective:  Seen early this morning.  Patient reports new onset dyspnea, worse with minimal exertion.  Also new dry cough.  No chest pain.  Objective:   Vitals:   01/23/21 2049 01/24/21 0409 01/24/21 0431 01/24/21 0855  BP: 98/79 (!) 106/55  (!) 119/54  Pulse: (!) 57 (!) 53  (!) 51  Resp: 20 20  20   Temp: 99.6 F (37.6 C) 98.6 F (37 C)  98.5 F (36.9 C)  TempSrc: Oral Oral  Oral  SpO2: 90% 95%    Weight:   74.9 kg   Height:        General exam: Pleasant elderly female, moderately built and thinly nourished, sitting up in reclining chair.  Does not look as well as she did yesterday.  Mild DOE on speaking for long. Respiratory system: Few fine bilateral basal crackles.  Rest of lung fields clear to auscultation. Cardiovascular system: S1 and S2 heard, RRR.  No JVD or murmurs.?  Gallop.  No pedal edema. Gastrointestinal system: Abdomen  is nondistended, soft and nontender. No organomegaly or masses felt. Normal bowel sounds heard. Central nervous system: Alert and oriented. No focal neurological deficits. Extremities: Symmetric 5 x 5 power. Skin: No rashes, lesions or ulcers Psychiatry: Judgement and insight appear normal. Mood & affect appropriate.     Data Reviewed:   I have personally reviewed following labs and imaging studies   CBC: Recent Labs  Lab 01/22/21 1745 01/23/21 0401 01/24/21 0349  WBC 15.6* 15.3* 14.8*  NEUTROABS 13.6*  --   --   HGB 11.8* 11.4* 10.8*  HCT 36.0 35.9* 33.2*  MCV 91.4 92.5 93.8  PLT 187 165 737    Basic Metabolic Panel: Recent Labs  Lab 01/22/21 1745 01/22/21 2054 01/23/21 0401 01/24/21 0349  NA 137  --  134* 134*  K 2.9*  --  3.0* 3.5  CL 101  --  99 103  CO2 26  --  22 21*  GLUCOSE 195*  --  198* 211*  BUN 51*  --  53* 56*  CREATININE 2.09*  --  2.13* 1.99*  CALCIUM 8.6*  --  8.6* 8.7*  MG  --  2.2  --   --     Liver Function Tests: Recent Labs  Lab 01/22/21 1745  AST 34  ALT 35  ALKPHOS 93  BILITOT 1.7*  PROT 5.6*  ALBUMIN 3.0*    CBG: Recent Labs  Lab 01/23/21 2051 01/24/21 0756 01/24/21 1130  GLUCAP 228* 209* 250*    Microbiology Studies:   Recent Results (from the past 240 hour(s))  Resp Panel by RT-PCR (Flu A&B, Covid) Nasopharyngeal Swab     Status: None   Collection Time: 01/22/21  8:04 PM   Specimen: Nasopharyngeal Swab; Nasopharyngeal(NP) swabs in vial transport medium  Result Value Ref Range Status   SARS Coronavirus 2 by RT PCR NEGATIVE NEGATIVE Final    Comment: (NOTE) SARS-CoV-2 target nucleic acids are NOT DETECTED.  The SARS-CoV-2 RNA is generally detectable in upper respiratory specimens during the acute phase of infection. The lowest concentration of SARS-CoV-2 viral copies this assay can detect is 138 copies/mL. A negative result does not preclude SARS-Cov-2 infection and should not be used as the sole basis for  treatment or other patient management decisions. A negative result may occur with  improper specimen collection/handling, submission of specimen other than nasopharyngeal swab, presence of viral mutation(s) within the areas targeted by this assay, and inadequate number of viral copies(<138 copies/mL). A negative result must be combined with clinical observations, patient history, and epidemiological information. The expected result is Negative.  Fact Sheet for Patients:  EntrepreneurPulse.com.au  Fact Sheet for Healthcare Providers:  IncredibleEmployment.be  This test is no t yet approved or cleared by the [place] FDA and  has been authorized for detection and/or diagnosis of SARS-CoV-2 by FDA under an Emergency Use Authorization (EUA). This EUA will remain  in effect (meaning this test can be used) for the duration of the COVID-19 declaration under Section 564(b)(1) of the Act, 21 U.S.C.section 360bbb-3(b)(1), unless the authorization is terminated  or revoked sooner.       Influenza A by PCR NEGATIVE NEGATIVE Final   Influenza B by PCR NEGATIVE NEGATIVE Final    Comment: (NOTE) The Xpert Xpress SARS-CoV-2/FLU/RSV plus assay is intended as an aid in the diagnosis of influenza from Nasopharyngeal swab specimens and should not be used as a sole basis for treatment. Nasal washings and aspirates are unacceptable for Xpert Xpress SARS-CoV-2/FLU/RSV testing.  Fact Sheet for Patients: EntrepreneurPulse.com.au  Fact Sheet for Healthcare Providers: IncredibleEmployment.be  This test is not yet approved or cleared by the [place] FDA and has been authorized for detection and/or diagnosis of SARS-CoV-2 by FDA under an Emergency Use Authorization (EUA). This EUA will remain in effect (meaning this test can be used) for the duration of the COVID-19 declaration under Section 564(b)(1) of the Act, 21  U.S.C. section 360bbb-3(b)(1), unless the authorization is terminated or revoked.  Performed at [facility], [place] [address], [place], [place] [address]   Culture, Urine     Status: Abnormal (Preliminary result)   Collection Time: 01/22/21 10:30 PM   Specimen: Urine, Clean Catch  Result Value Ref Range Status   Specimen Description   Final    URINE, CLEAN CATCH Performed at [facility], [place] [address] [place], [place] [address]    Special Requests   Final    NONE Performed at [facility], [place] [address] [place], [place]  [address]    Culture (A)  Final    >=100,000 COLONIES/mL ESCHERICHIA COLI SUSCEPTIBILITIES TO FOLLOW Performed at [facility], [place] [address], [place], [place] [address]    Report Status PENDING  Incomplete     Radiology Studies:  US RENAL  Result Date: 2021-02-17 CLINICAL DATA:  Acute kidney injury EXAM: RENAL / URINARY TRACT ULTRASOUND COMPLETE COMPARISON:  None. FINDINGS: Right Kidney: Renal measurements: 11.2 x 4.8 x 6.1 cm = volume: 174 mL. Echogenicity within normal limits. Multiple benign exophytic cysts. No mass or hydronephrosis visualized. Left Kidney: Renal measurements: 9.9 x 5.8 x 6.4 cm = volume: 190 mL. Echogenicity within normal limits. Multiple benign exophytic cysts. No mass or hydronephrosis visualized. Bladder: Appears normal for degree of bladder distention. Other: None. IMPRESSION: 1.  No significant abnormality identified. No hydronephrosis. 2. Multiple bilateral benign exophytic renal cysts, definitively benign, for which no further follow-up or characterization is required. Electronically Signed   By: [name] M.D.   On: 2021-02-17 10:20   NM Pulmonary Perf and Vent  Result Date: 02/17/21 CLINICAL DATA:  Dyspnea for several months with lytic sources. EXAM: NUCLEAR MEDICINE PERFUSION LUNG SCAN TECHNIQUE: Perfusion images were obtained in multiple projections after  intravenous injection of radiopharmaceutical. Ventilation scans intentionally deferred if perfusion scan and chest x-ray adequate for interpretation during COVID 19 epidemic. RADIOPHARMACEUTICALS:  4.4 mCi Tc-60m MAA IV COMPARISON:  Chest radiograph from one day prior. FINDINGS: No significant pulmonary perfusion defects. Inferior midline and medial left lower chest defect correlates with mild cardiomegaly as seen on chest radiograph. IMPRESSION: No evidence of pulmonary embolism on perfusion only scan. Electronically Signed   By: Ilona Sorrel M.D.   On: 02-17-21 12:13   DG CHEST PORT 1 VIEW  Result Date: 01/24/2021 CLINICAL DATA:  Dyspnea EXAM: PORTABLE CHEST 1 VIEW COMPARISON:  01/22/2021 chest radiograph. FINDINGS: Stable cardiomediastinal silhouette with mild cardiomegaly. No pneumothorax. Trace bilateral pleural effusions. Stable calcified 8 mm right costophrenic angle granuloma. Mild diffuse prominence and haziness of the parahilar interstitial markings, suggesting mild pulmonary edema. IMPRESSION: Mild congestive heart failure with trace bilateral pleural effusions. Electronically Signed   By: Ilona Sorrel M.D.   On: 01/24/2021 09:35   DG Chest Port 1 View  Result Date: 01/22/2021 CLINICAL DATA:  Weakness and shortness of breath, recent fall EXAM: PORTABLE CHEST 1 VIEW COMPARISON:  09/26/2019 FINDINGS: Cardiac shadow is enlarged but stable. Aortic calcifications are seen. Lungs are well aerated bilaterally. No focal infiltrate or sizable effusion is seen. Calcified granuloma in the right base is noted. No acute bony abnormality is noted. IMPRESSION: No acute abnormality noted. Electronically Signed   By: [name] M.D.   On: 01/22/2021 18:50     Scheduled Meds:   . amiodarone  200 mg Oral Daily  . apixaban  2.5 mg Oral BID  . feeding supplement  237 mL Oral BID BM  . furosemide  40 mg Intravenous Q12H  . insulin aspart  0-6 Units Subcutaneous TID WC  . multivitamin with minerals  1  tablet Oral Daily  . nadolol  10 mg Oral Daily    Continuous Infusions:   . cefTRIAXone (ROCEPHIN)  IV Stopped (01/24/21 0654)     LOS: 1 day     Vernell Leep, MD, [place], [facility]. [facility]    To contact the attending provider between 7A-7P or the covering provider during after hours 7P-7A, please log into the web site [email or web address] and access using universal [place] password for  that web site. If you do not have the password, please call the hospital operator.  01/24/2021, 3:15 PM

## 2021-01-25 DIAGNOSIS — I495 Sick sinus syndrome: Secondary | ICD-10-CM

## 2021-01-25 LAB — URINE CULTURE: Culture: >=100000 — AB

## 2021-01-25 LAB — CBC
HCT: 33.8 % — ABNORMAL LOW (ref 36.0–46.0)
Hemoglobin: 10.7 g/dL — ABNORMAL LOW (ref 12.0–15.0)
MCH: 29.8 pg (ref 26.0–34.0)
MCHC: 31.7 g/dL (ref 30.0–36.0)
MCV: 94.2 fL (ref 80.0–100.0)
Platelets: 191 10*3/uL (ref 150–400)
RBC: 3.59 MIL/uL — ABNORMAL LOW (ref 3.87–5.11)
RDW: 14.6 % (ref 11.5–15.5)
WBC: 13.9 10*3/uL — ABNORMAL HIGH (ref 4.0–10.5)
nRBC: 0 % (ref 0.0–0.2)

## 2021-01-25 LAB — GLUCOSE, CAPILLARY
Glucose-Capillary: 240 mg/dL — ABNORMAL HIGH (ref 70–99)
Glucose-Capillary: 192 mg/dL — ABNORMAL HIGH (ref 70–99)
Glucose-Capillary: 182 mg/dL — ABNORMAL HIGH (ref 70–99)
Glucose-Capillary: 225 mg/dL — ABNORMAL HIGH (ref 70–99)

## 2021-01-25 LAB — ECHOCARDIOGRAM COMPLETE
Area-P 1/2: 2.29 cm2
Height: 61 in
MV M vel: 4.53 m/s
MV Peak grad: 82.1 mmHg
Radius: 0.3 cm
S' Lateral: 2.89 cm
Weight: 2641.6 oz

## 2021-01-25 LAB — BASIC METABOLIC PANEL
Anion gap: 10 (ref 5–15)
BUN: 50 mg/dL — ABNORMAL HIGH (ref 8–23)
CO2: 25 mmol/L (ref 22–32)
Calcium: 8.6 mg/dL — ABNORMAL LOW (ref 8.9–10.3)
Chloride: 103 mmol/L (ref 98–111)
Creatinine, Ser: 1.89 mg/dL — ABNORMAL HIGH (ref 0.44–1.00)
GFR, Estimated: 26 mL/min — ABNORMAL LOW (ref >60)
Glucose, Bld: 191 mg/dL — ABNORMAL HIGH (ref 70–99)
Potassium: 4.1 mmol/L (ref 3.5–5.1)
Sodium: 138 mmol/L (ref 135–145)

## 2021-01-25 LAB — SEDIMENTATION RATE: Sed Rate: 75 mm/hr — ABNORMAL HIGH (ref 0–22)

## 2021-01-25 NOTE — Progress Notes (Signed)
Patient ambulated with front wheel walker 200 ft on 2L with nurse and family. Patient is motivated and eager to participate in activities to increase independence and overall good health.

## 2021-01-25 NOTE — Progress Notes (Signed)
Briana [name] Progress Note  Subjective: no new c/o, 1500 cc UOP yest and 1700 today so far. Creat down slightly at 1.89.  Looks better, not coughing like yesterday  Vitals:   01/24/21 1617 01/24/21 2002 01/24/21 2002 01/24/21 2007  BP: 111/68 (!) 96/58  111/62  Pulse: 67 (!) 49 (!) 50 (!) 51  Resp: 16 19  19   Temp: 97.9 F (36.6 C) 99.7 F (37.6 C)  99.6 F (37.6 C)  TempSrc:  Oral  Oral  SpO2: 100% 91% 92% 91%  Weight:      Height:        Exam: Gen elderly WF , coughing better  +JVD Chest small rales bilat bases, improving RRR no MRG Abd soft ntnd no mass or ascites +bs Ext no pitting LE or hip edema Neuro is alert, Ox 3 , nf     Home meds:  - norvasc 7.5 qd/ bumex 1mg  on m-f / nadolol 10-20 mg qd/ benicar 40 qd  - eliquis 5 bid/ amio 200 qd  - prn's/ vitamins/ supplements    UA 3/9 - 100 prot, few bac, >50 wbc, 0-5 rbc   UCx +>100,000 EColi    Renal US 3/10 - 10- 12 cm kidneys no hydro.   Assessment/ Plan: 1. AKI - baseline creat 0.9 from Oct 2021. Pt admitted for falls and possible dehydration, given IVF's and developed SOB and +CXR for pulm edema. IVF"s stopped. Lasix IV 40 bid started yest. Looks better. AKI likely due to decomp CHF. UA negative, renal US w/o obstruction. Cont to hold ARB.  Creat 2.0 on admit, down to 1.9 yest and 1.8 today. Continues on diuretics per cardiology. Will follow.  2. A/C diast CHF - cardiology consulting. Getting IV lasix 40 bid. Holding ARB.  3. EColi UTI - getting Rocephin.  4. Atrial fib - long hx per family , on eliquis and BB. BB on hold for low HR 5. Bradycardia - nadolol on hold       Rob Ceola Para 01/25/2021, 12:27 PM   Recent Labs  Lab 01/24/21 0349 01/25/21 0428  K 3.5 4.1  BUN 56* 50*  CREATININE 1.99* 1.89*  CALCIUM 8.7* 8.6*  HGB 10.8* 10.7*   Inpatient medications: . amiodarone  200 mg Oral Daily  . apixaban  2.5 mg Oral BID  . feeding supplement  237 mL Oral BID BM  . furosemide  40  mg Intravenous Q12H  . insulin aspart  0-6 Units Subcutaneous TID WC  . multivitamin with minerals  1 tablet Oral Daily    albuterol

## 2021-01-25 NOTE — Evaluation (Signed)
Occupational Therapy Evaluation Patient Details Name: Briana [name] MRN: [id number] DOB: 1934/08/31 Today's Date: 01/25/2021    History of Present Illness 87 year old female, lives alone, ambulates with a walker x2 weeks, not on home oxygen, medical history significant for HTN, persistent atrial fibrillation, chronic diastolic CHF, GERD, hyperlipidemia presented to the ED 01/23/21 due to weakness and falls.  Over the last 2 weeks, due to worsening leg edema/tightness and dyspnea, Admitted for acute on chronic kidney disease, transient hypoxia with associated generalized weakness and falls.   Clinical Impression   Mrs. Briana [name] is an 87 year old woman who lives alone and is normally independent. Reports the most difficult thing for her to do is mop her floors because it causes back pain. Patient on 3 L [place] and reports being sleepy this morning. On evaluation patient exhibits generalized weakness, decreased activity tolerance and impaired cardiopulmonary endurance. Patient's HR between 43-50 during evaluation and treatment. Patient's o2 sat 98% on 3L and therapist removed [place] to assess cardiopulmonary endurance as she typically doesn't wear oxygen at home. Patient's o2 predominantly between 92-94% on RA at rest. Patient overall min guard with standing and ambulation and supervision for ADLs to monitor vital signs and for safety. Patient able to ambulate with RW to perform toileting and stand at sink for grooming. At times o2 sat drops to 89% but increased to 92% with cues for patient to take deep breaths. Patient wanting to return to bed. O2 sats dropped to 87% in supine and therapist replaced [place] on 2L with o2 sats at 94%. RN informed. Patient reports feeling generally weak and reports wanting to go home at discharge - reports she isn't allowed to get up and move enough here. Therapist and patient discussed POC - therapist recomends [place] [facility] at discharge as long as patient has 24/7 supervision initially.  Therapist and patient discussed patient asking daughter to stay with her or going to stay with daughter at discharge. Patient will benefit from skilled OT services while in hospital to improve deficits and learn compensatory strategies as needed in order to return to PLOF.      Follow Up Recommendations  Home health OT;Supervision/Assistance - 24 hour    Equipment Recommendations  3 in 1 bedside commode    Recommendations for Other Services       Precautions / Restrictions Precautions Precautions: Fall Precaution Comments: hx of falling (secondary to bradycardia?) Restrictions Weight Bearing Restrictions: No      Mobility Bed Mobility Overal bed mobility: Needs Assistance Bed Mobility: Sit to Supine       Sit to supine: Supervision        Transfers Overall transfer level: Needs assistance Equipment used: Rolling walker (2 wheeled)   Sit to Stand: Min guard         General transfer comment: min guard for ambulation with RW    Balance Overall balance assessment: Mild deficits observed, not formally tested                                         ADL either performed or assessed with clinical judgement   ADL Overall ADL's : Needs assistance/impaired Eating/Feeding: Independent   Grooming: Supervision/safety;Standing;Wash/dry hands;Wash/dry face;Oral care Grooming Details (indicate cue type and reason): performed grooming standing at sink, bent over and resting on counter (hx of back pain) Upper Body Bathing: Supervision/ safety   Lower Body Bathing: Min  guard;Sit to/from stand       Lower Body Dressing: Min guard;Sit to/from stand   Toilet Transfer: Comfort height toilet;Grab bars;RW;Min guard   Toileting- Clothing Manipulation and Hygiene: Supervision/safety;Sit to/from stand       Functional mobility during ADLs: Min guard;Rolling walker       Vision Patient Visual Report: No change from baseline       Perception     Praxis       Pertinent Vitals/Pain Pain Assessment: No/denies pain     Hand Dominance Right   Extremity/Trunk Assessment Upper Extremity Assessment Upper Extremity Assessment: Generalized weakness   Lower Extremity Assessment Lower Extremity Assessment: Defer to PT evaluation   Cervical / Trunk Assessment Cervical / Trunk Assessment: Kyphotic   Communication Communication Communication: No difficulties   Cognition Arousal/Alertness: Awake/alert Behavior During Therapy: WFL for tasks assessed/performed Overall Cognitive Status: Within Functional Limits for tasks assessed                                     General Comments       Exercises     Shoulder Instructions      Home Living Family/patient expects to be discharged to:: Private residence   Available Help at Discharge: Available PRN/intermittently;Family Type of Home: House Home Access: Stairs to enter [employer] of Steps: 2 Entrance Stairs-Rails: Right Home Layout: One level     Bathroom Shower/Tub: Tub/shower unit;Curtain   Biochemist, clinical: Standard     Home Equipment: Environmental consultant - 4 wheels;Cane - quad   Additional Comments: reports close neighbors can assist. and daughter      Prior Functioning/Environment Level of Independence: Independent with assistive device(s)        Comments: recently using RW, was driving until weakness        OT Problem List: Decreased strength;Decreased activity tolerance;Impaired balance (sitting and/or standing);Cardiopulmonary status limiting activity      OT Treatment/Interventions: Self-care/ADL training;Therapeutic exercise;DME and/or AE instruction;Balance training;Patient/family education;Therapeutic activities    OT Goals(Current goals can be found in the care plan section) Acute Rehab OT Goals Patient Stated Goal: to get stronger OT Goal Formulation: With patient Time For Goal Achievement: 02/08/21 Potential to Achieve Goals: Good  OT  Frequency: Min 2X/week   Barriers to D/C: Decreased caregiver support          Co-evaluation              AM-PAC OT "6 Clicks" Daily Activity     Outcome Measure Help from another person eating meals?: None Help from another person taking care of personal grooming?: A Little Help from another person toileting, which includes using toliet, bedpan, or urinal?: A Little Help from another person bathing (including washing, rinsing, drying)?: A Little Help from another person to put on and taking off regular upper body clothing?: A Little Help from another person to put on and taking off regular lower body clothing?: A Little 6 Click Score: 19   End of Session Equipment Utilized During Treatment: Rolling walker;Oxygen Nurse Communication: Mobility status  Activity Tolerance: Patient limited by fatigue Patient left: in bed;with call bell/phone within reach;with bed alarm set  OT Visit Diagnosis: Unsteadiness on feet (R26.81);Muscle weakness (generalized) (M62.81)                Time: 0102-7253 OT Time Calculation (min): 45 min Charges:  OT General Charges $OT Visit: 1 Visit OT Evaluation $OT Eval  Moderate Complexity: 1 Mod OT Treatments $Self Care/Home Management : 8-22 mins  Nyrie Sigal, OTR/L [place]  Office [phone] Pager: [phone]   Lenward Chancellor 01/25/2021, 12:21 PM

## 2021-01-25 NOTE — Plan of Care (Signed)

## 2021-01-25 NOTE — Progress Notes (Signed)
PROGRESS NOTE   Briana [name]  VWP:[id number]    DOB: April 22, 1934    DOA: 01/22/2021  PCP: Unk Pinto, MD   I have briefly reviewed patients previous medical [facility].  Chief Complaint  Patient presents with  . Fall    Brief Narrative:  87 year old female, lives alone, ambulates with a walker x2 weeks, not on home oxygen, medical history significant for HTN, persistent atrial fibrillation, chronic diastolic CHF, GERD, hyperlipidemia presented to the ED due to weakness and falls.  Over the last 2 weeks, due to worsening leg edema/tightness and dyspnea, as per her physician instruction she took a couple days of extra doses of Bumex.  Admitted for acute on chronic kidney disease, transient hypoxia with associated generalized weakness and falls.  Treated with IV antibiotics for about 24 hours with minimal improvement in creatinine but developed worsening dyspnea due to CHF.  IV fluids discontinued.  Cardiology and nephrology consulted.   Assessment & Plan:  Principal Problem:   AKI (acute kidney injury) ([place]) Active Problems:   Essential hypertension   Unspecified atrial fibrillation (HCC)   Chronic diastolic heart failure (HCC)   Acquired thrombophilia ([place])   Acute kidney injury complicating CKD stage IIIa:  Baseline creatinine 0.9 in October 2021.  Presented with creatinine of 2.09.  Acute kidney injury suspected due to recent increased dose of Bumex, ARB and some GI losses.  ARB held.   Renal ultrasound: No hydronephrosis.  She was briefly hydrated with IV fluids for about 24 hours, creatinine marginally improved but she developed dyspnea and decompensated CHF.  IV fluids were discontinued.  Nephrology consulted.  AKI likely secondary to hemodynamics and ARB.  Started IV Lasix 40 mg every 12 hours.  Creatinine has improved from 1.99-1.89.  Urine output 1700 mL overnight.  Continue IV Lasix and follow BMP in a.m.  Hypokalemia:  Replaced.   Magnesium 2.2.  Pansensitive E. coli acute cystitis  Urine culture shows >100 K colonies of E. coli  Completed course with 3 days of IV ceftriaxone  Did have some dysuria PTA she says but seems to have resolved.  Also reported chills PTA.  Leukocytosis  May be reactive.?  Recent outpatient steroid use.  Improving.  Generalized weakness/debility and falls  Secondary to dehydration, acute kidney injury complicating advanced age and frailty.  No syncope.  Therapies evaluation recommend home health PT and OT at discharge.  Acute on chronic diastolic CHF:  2D echo 01/20/4826: LVEF 60-65%.  Repeat 2D echo 3/11: LVEF 55% without regional wall motion abnormalities.  Follows with Dr. Claiborne Billings, Cardiology.  As noted above, diuretics held on admission due to AKI and dehydration, briefly hydrated with IV fluids which precipitated acute CHF  Cardiology consulted.  IV Lasix as above but nephrology  Improving.  Continue IV Lasix for today and could possibly transition to oral Lasix tomorrow.  Persistent atrial fibrillation  Continue nadolol, amiodarone and reduced dose of apixaban due to AKI.  Currently in sinus rhythm  Ongoing sinus bradycardia in the high 40s-50s, nadolol discontinued after 3/11 AM dose due to soft blood pressures and bradycardia.  Cardiology follow-up appreciated.  They got history of fatigue, lightheadedness and an episode of syncope and feel that bradycardia may be contributing.  They recommend continuing to hold nadolol and if her bradycardia persists then EP to see  Syncope  Sinus bradycardia on telemetry.  Echo results as above.  If driving, patient to be counseled that she should not drive for 6 months.  Transient hypoxia/acute hypoxic respiratory failure  D-dimer up to 2.44.  No evidence of pulmonary embolism on perfusion only scan.  Incentive spirometry and wean oxygen as tolerated.  Essential hypertension:  Nadolol discontinued  3/11  Holding ARB due to AKI and amlodipine.  Soft blood pressures yesterday have improved today.  Prolonged QTC  Difficult to accurately interpret in the context of BBB morphology.  Replaced hypokalemia.  Magnesium 2.2.  Avoid QT prolonging medications.  Already on amiodarone.  Prediabetes  Follow CBGs and SSI if needed.  Multiple bilateral benign exophytic renal cysts  Noted on renal ultrasound and as per radiologist, no follow-up or characterization required.  Anemia  Possibly chronic disease.  Stable.  Body mass index is 31.2 kg/m.  Nutritional Status Nutrition Problem: Inadequate oral intake Etiology: poor appetite Signs/Symptoms: per patient/family report Interventions: Ensure Enlive (each supplement provides 350kcal and 20 grams of protein),MVI    DVT prophylaxis: apixaban (ELIQUIS) tablet 2.5 mg Start: 01/23/21 2200  On apixaban   Code Status: Full Code Family Communication: I discussed with patient's daughter via phone on 3/12, updated care and answered questions. Disposition:  Status is: Inpatient  Remains inpatient appropriate because:Inpatient level of care appropriate due to severity of illness   Dispo: The patient is from: Home              Anticipated d/c is to: TBD              Patient currently is not medically stable to d/c.   Difficult to place patient No        Consultants:   Nephrology Cardiology  Procedures:   None  Antimicrobials:    Anti-infectives (From admission, onward)   Start     Dose/Rate Route Frequency Ordered Stop   01/23/21 0645  cefTRIAXone (ROCEPHIN) 1 g in sodium chloride 0.9 % 100 mL IVPB  Status:  Discontinued        1 g 200 mL/hr over 30 Minutes Intravenous Every 24 hours 01/23/21 0546 01/25/21 0628        Subjective:  Reports feeling better.  Dyspnea somewhat better but not completely resolved.  Objective:   Vitals:   01/24/21 2002 01/24/21 2002 01/24/21 2007 01/25/21 1235  BP: (!) 96/58   111/62 122/64  Pulse: (!) 49 (!) 50 (!) 51 (!) 49  Resp: 19  19 18   Temp: 99.7 F (37.6 C)  99.6 F (37.6 C) 98.6 F (37 C)  TempSrc: Oral  Oral Oral  SpO2: 91% 92% 91% 97%  Weight:      Height:        General exam: Pleasant elderly female, moderately built and thinly nourished, sitting up in reclining chair.  Looks better than she did yesterday.  No dyspnea at rest but still has DOE. Respiratory system: Occasional bibasilar crackles.  Rest of lung fields clear to auscultation. Cardiovascular system: S1 and S2 heard, RRR.  No JVD or murmurs.?  Gallop.  No pedal edema.  Telemetry personally reviewed: SB in the high 40s-SR in the 50s. Gastrointestinal system: Abdomen is nondistended, soft and nontender. No organomegaly or masses felt. Normal bowel sounds heard. Central nervous system: Alert and oriented. No focal neurological deficits. Extremities: Symmetric 5 x 5 power. Skin: No rashes, lesions or ulcers Psychiatry: Judgement and insight appear somewhat impaired. Mood & affect appropriate.     Data Reviewed:   I have personally reviewed following labs and imaging studies   CBC: Recent Labs  Lab 01/22/21 1745 01/23/21 0401 01/24/21 0349  01/25/21 0428  WBC 15.6* 15.3* 14.8* 13.9*  NEUTROABS 13.6*  --   --   --   HGB 11.8* 11.4* 10.8* 10.7*  HCT 36.0 35.9* 33.2* 33.8*  MCV 91.4 92.5 93.8 94.2  PLT 187 165 165 283    Basic Metabolic Panel: Recent Labs  Lab 01/22/21 2054 01/23/21 0401 01/24/21 0349 01/25/21 0428  NA  --  134* 134* 138  K  --  3.0* 3.5 4.1  CL  --  99 103 103  CO2  --  22 21* 25  GLUCOSE  --  198* 211* 191*  BUN  --  53* 56* 50*  CREATININE  --  2.13* 1.99* 1.89*  CALCIUM  --  8.6* 8.7* 8.6*  MG 2.2  --   --   --     Liver Function Tests: Recent Labs  Lab 01/22/21 1745  AST 34  ALT 35  ALKPHOS 93  BILITOT 1.7*  PROT 5.6*  ALBUMIN 3.0*    CBG: Recent Labs  Lab 01/24/21 2002 01/25/21 0850 01/25/21 1117  GLUCAP 195* 240* 192*     Microbiology Studies:   Recent Results (from the past 240 hour(s))  Resp Panel by RT-PCR (Flu A&B, Covid) Nasopharyngeal Swab     Status: None   Collection Time: 01/22/21  8:04 PM   Specimen: Nasopharyngeal Swab; Nasopharyngeal(NP) swabs in vial transport medium  Result Value Ref Range Status   SARS Coronavirus 2 by RT PCR NEGATIVE NEGATIVE Final    Comment: (NOTE) SARS-CoV-2 target nucleic acids are NOT DETECTED.  The SARS-CoV-2 RNA is generally detectable in upper respiratory specimens during the acute phase of infection. The lowest concentration of SARS-CoV-2 viral copies this assay can detect is 138 copies/mL. A negative result does not preclude SARS-Cov-2 infection and should not be used as the sole basis for treatment or other patient management decisions. A negative result may occur with  improper specimen collection/handling, submission of specimen other than nasopharyngeal swab, presence of viral mutation(s) within the areas targeted by this assay, and inadequate number of viral copies(<138 copies/mL). A negative result must be combined with clinical observations, patient history, and epidemiological information. The expected result is Negative.  Fact Sheet for Patients:  EntrepreneurPulse.com.au  Fact Sheet for Healthcare Providers:  IncredibleEmployment.be  This test is no t yet approved or cleared by the [place] FDA and  has been authorized for detection and/or diagnosis of SARS-CoV-2 by FDA under an Emergency Use Authorization (EUA). This EUA will remain  in effect (meaning this test can be used) for the duration of the COVID-19 declaration under Section 564(b)(1) of the Act, 21 U.S.C.section 360bbb-3(b)(1), unless the authorization is terminated  or revoked sooner.       Influenza A by PCR NEGATIVE NEGATIVE Final   Influenza B by PCR NEGATIVE NEGATIVE Final    Comment: (NOTE) The Xpert Xpress SARS-CoV-2/FLU/RSV  plus assay is intended as an aid in the diagnosis of influenza from Nasopharyngeal swab specimens and should not be used as a sole basis for treatment. Nasal washings and aspirates are unacceptable for Xpert Xpress SARS-CoV-2/FLU/RSV testing.  Fact Sheet for Patients: EntrepreneurPulse.com.au  Fact Sheet for Healthcare Providers: IncredibleEmployment.be  This test is not yet approved or cleared by the [place] FDA and has been authorized for detection and/or diagnosis of SARS-CoV-2 by FDA under an Emergency Use Authorization (EUA). This EUA will remain in effect (meaning this test can be used) for the duration of the COVID-19 declaration under Section 564(b)(1) of  the Act, 21 U.S.C. section 360bbb-3(b)(1), unless the authorization is terminated or revoked.  Performed at [facility], [place] [address], [place], [place] [address]   Culture, Urine     Status: Abnormal   Collection Time: 01/22/21 10:30 PM   Specimen: Urine, Clean Catch  Result Value Ref Range Status   Specimen Description   Final    URINE, CLEAN CATCH Performed at [facility], [place] [address] [place], [place] [address]    Special Requests   Final    NONE Performed at [facility], [place] [address], [place],  [address]    Culture >=100,000 COLONIES/mL ESCHERICHIA COLI (A)  Final   Report Status 01/25/2021 FINAL  Final   Organism ID, Bacteria ESCHERICHIA COLI (A)  Final      Susceptibility   Escherichia coli - MIC*    AMPICILLIN 4 SENSITIVE Sensitive     CEFAZOLIN <=4 SENSITIVE Sensitive     CEFEPIME <=0.12 SENSITIVE Sensitive     CEFTRIAXONE <=0.25 SENSITIVE Sensitive     CIPROFLOXACIN <=0.25 SENSITIVE Sensitive     GENTAMICIN <=1 SENSITIVE Sensitive     IMIPENEM <=0.25 SENSITIVE Sensitive     NITROFURANTOIN <=16 SENSITIVE Sensitive     TRIMETH/SULFA <=20 SENSITIVE Sensitive     AMPICILLIN/SULBACTAM <=2  SENSITIVE Sensitive     PIP/TAZO <=4 SENSITIVE Sensitive     * >=100,000 COLONIES/mL ESCHERICHIA COLI     Radiology Studies:  DG CHEST PORT 1 VIEW  Result Date: 01/24/2021 CLINICAL DATA:  Dyspnea EXAM: PORTABLE CHEST 1 VIEW COMPARISON:  01/22/2021 chest radiograph. FINDINGS: Stable cardiomediastinal silhouette with mild cardiomegaly. No pneumothorax. Trace bilateral pleural effusions. Stable calcified 8 mm right costophrenic angle granuloma. Mild diffuse prominence and haziness of the parahilar interstitial markings, suggesting mild pulmonary edema. IMPRESSION: Mild congestive heart failure with trace bilateral pleural effusions. Electronically Signed   By: Ilona Sorrel M.D.   On: 01/24/2021 09:35   ECHOCARDIOGRAM COMPLETE  Result Date: 01/25/2021    ECHOCARDIOGRAM REPORT   Patient Name:   ROYALTI SCHAUF Date of Exam: 01/24/2021 Medical Rec #:  [id number]     Height:       61.0 in Accession #:    [id number]    Weight:       165.1 lb Date of Birth:  Jul 05, 1934     BSA:          1.741 m Patient Age:    83 years      BP:           119/54 mmHg Patient Gender: F             HR:           50 bpm. Exam Location:  Inpatient Procedure: 2D Echo, Cardiac Doppler and Color Doppler Indications:    C94.70 Chronic systolic (congestive) heart failure  History:        Patient has prior history of Echocardiogram examinations, most                 recent 11/22/2018. Signs/Symptoms:Murmur; Risk                 Factors:Dyslipidemia and GERD. Cancer.  Sonographer:    Jonelle Sidle Dance Referring Phys: [id number] Ellyson Rarick D Adrielle Polakowski IMPRESSIONS  1. Left ventricular ejection fraction, by estimation, is 55%. The left ventricle has normal function. The left ventricle has no regional wall motion abnormalities. There is mild left ventricular hypertrophy. Left ventricular diastolic parameters are indeterminate.  2. Right ventricular systolic function is normal. The right ventricular size is mildly enlarged. There is moderately elevated pulmonary  artery systolic pressure. The estimated right ventricular systolic pressure is 62.7 mmHg.  3. Left atrial size was moderately dilated.  4. Right atrial size was mildly dilated.  5. The mitral valve is grossly normal. Mild to moderate mitral valve regurgitation. No evidence of mitral stenosis.  6. The aortic valve is grossly normal. Aortic valve regurgitation is not visualized. No aortic stenosis is present.  7. The inferior vena cava is dilated in size with <50% respiratory variability, suggesting right atrial pressure of 15 mmHg. FINDINGS  Left Ventricle: Left ventricular ejection fraction, by estimation, is 55%. The left ventricle has normal function. The left ventricle has no regional wall motion abnormalities. The left ventricular internal cavity size was normal in size. There is mild left ventricular hypertrophy. Left ventricular diastolic parameters are indeterminate. Right Ventricle: The right ventricular size is mildly enlarged. No increase in right ventricular wall thickness. Right ventricular systolic function is normal. There is moderately elevated pulmonary artery systolic pressure. The tricuspid regurgitant velocity is 3.10 m/s, and with an assumed right atrial pressure of 15 mmHg, the estimated right ventricular systolic pressure is 03.5 mmHg. Left Atrium: Left atrial size was moderately dilated. Right Atrium: Right atrial size was mildly dilated. Pericardium: Trivial pericardial effusion is present. Mitral Valve: The mitral valve is grossly normal. Mild to moderate mitral valve regurgitation. No evidence of mitral valve stenosis. The mean mitral valve gradient is 1.6 mmHg with average heart rate of 50 bpm. Tricuspid Valve: The tricuspid valve is normal in structure. Tricuspid valve regurgitation is trivial. No evidence of tricuspid stenosis. Aortic Valve: The aortic valve is grossly normal. Aortic valve regurgitation is not visualized. No aortic stenosis is present. Pulmonic Valve: The pulmonic valve  was not well visualized. Pulmonic valve regurgitation is trivial. No evidence of pulmonic stenosis. Aorta: The aortic root is normal in size and structure. Venous: The inferior vena cava is dilated in size with less than 50% respiratory variability, suggesting right atrial pressure of 15 mmHg. IAS/Shunts: No atrial level shunt detected by color flow Doppler.  LEFT VENTRICLE PLAX 2D LVIDd:         4.33 cm  Diastology LVIDs:         2.89 cm  LV e' medial:    5.22 cm/s LV PW:         1.11 cm  LV E/e' medial:  23.9 LV IVS:        1.28 cm  LV e' lateral:   9.45 cm/s LVOT diam:     2.00 cm  LV E/e' lateral: 13.2 LV SV:         61 LV SV Index:   35 LVOT Area:     3.14 cm  RIGHT VENTRICLE             IVC RV Basal diam:  3.31 cm     IVC diam: 2.24 cm RV Mid diam:    1.83 cm RV S prime:     11.70 cm/s TAPSE (M-mode): 2.1 cm LEFT ATRIUM           Index LA diam:      4.90 cm 2.81 cm/m LA Vol (A2C): 78.0 ml 44.80 ml/m LA Vol (A4C): 88.4 ml 50.81 ml/m  AORTIC VALVE LVOT Vmax:   91.40 cm/s LVOT Vmean:  63.000 cm/s LVOT VTI:    0.195 m  AORTA Ao Root diam: 2.80 cm Ao  Asc diam:  3.20 cm MITRAL VALVE                 TRICUSPID VALVE MV Area (PHT): 2.29 cm      TR Peak grad:   38.4 mmHg MV Mean grad:  1.6 mmHg      TR Vmax:        310.00 cm/s MV Decel Time: 331 msec MR Peak grad:    82.1 mmHg   SHUNTS MR Mean grad:    58.0 mmHg   Systemic VTI:  0.20 m MR Vmax:         453.00 cm/s Systemic Diam: 2.00 cm MR Vmean:        359.0 cm/s MR PISA:         0.57 cm MR PISA Eff ROA: 5 mm MR PISA Radius:  0.30 cm MV E velocity: 124.50 cm/s Cherlynn Kaiser MD Electronically signed by Cherlynn Kaiser MD Signature Date/Time: 01/25/2021/6:19:46 AM    Final      Scheduled Meds:   . amiodarone  200 mg Oral Daily  . apixaban  2.5 mg Oral BID  . feeding supplement  237 mL Oral BID BM  . furosemide  40 mg Intravenous Q12H  . insulin aspart  0-6 Units Subcutaneous TID WC  . multivitamin with minerals  1 tablet Oral Daily    Continuous  Infusions:      LOS: 2 days     Vernell Leep, MD, [place], [facility]. [facility]    To contact the attending provider between 7A-7P or the covering provider during after hours 7P-7A, please log into the web site [email or web address] and access using universal [place] password for that web site. If you do not have the password, please call the hospital operator.  01/25/2021, 12:51 PM

## 2021-01-25 NOTE — Progress Notes (Signed)
Progress Note  Patient Name: Briana [name] Date of Encounter: 01/25/2021  [facility] Cardiologist: Shelva Majestic, MD   Subjective   Reports some shortness of breath.  She is unsure if it is changed from yesterday.  She has some lightheadedness.  Reports an episode of standing up and finding herself on the ground a few days prior to admission.  She denies any preceding chest pain or palpitations.  Inpatient Medications    Scheduled Meds: . amiodarone  200 mg Oral Daily  . apixaban  2.5 mg Oral BID  . feeding supplement  237 mL Oral BID BM  . furosemide  40 mg Intravenous Q12H  . insulin aspart  0-6 Units Subcutaneous TID WC  . multivitamin with minerals  1 tablet Oral Daily   Continuous Infusions:  PRN Meds: albuterol   Vital Signs    Vitals:   01/24/21 1617 01/24/21 2002 01/24/21 2002 01/24/21 2007  BP: 111/68 (!) 96/58  111/62  Pulse: 67 (!) 49 (!) 50 (!) 51  Resp: 16 19  19   Temp: 97.9 F (36.6 C) 99.7 F (37.6 C)  99.6 F (37.6 C)  TempSrc:  Oral  Oral  SpO2: 100% 91% 92% 91%  Weight:      Height:        Intake/Output Summary (Last 24 hours) at 01/25/2021 1054 Last data filed at 01/25/2021 0900 Gross per 24 hour  Intake 1213 ml  Output 2700 ml  Net -1487 ml   Last 3 Weights 01/24/2021 01/23/2021 01/22/2021  Weight (lbs) 165 lb 1.6 oz 158 lb 15.2 oz 163 lb 2.3 oz  Weight (kg) 74.889 kg 72.1 kg 74 kg      Telemetry    Sinus bradycardia. 40s-50s - Personally Reviewed  ECG    01/22/2021: Sinus rhythm.  Rate 60 bpm.  Right bundle branch block. - Personally Reviewed  Physical Exam   VS:  BP 111/62 (BP Location: Right Arm)   Pulse (!) 51   Temp 99.6 F (37.6 C) (Oral)   Resp 19   Ht 5\' 1"  (1.549 m)   Wt 74.9 kg   SpO2 91%   BMI 31.20 kg/m  , BMI Body mass index is 31.2 kg/m. GENERAL:  Well appearing HEENT: Pupils equal round and reactive, fundi not visualized, oral mucosa unremarkable NECK:  No jugular venous distention, waveform within normal  limits, carotid upstroke brisk and symmetric, no bruits LUNGS:  Clear to auscultation bilaterally HEART:  Bradycardic.  Regular rhythm.  PMI not displaced or sustained,S1 and S2 within normal limits, no S3, no S4, no clicks, no rubs, no murmurs ABD:  Flat, positive bowel sounds normal in frequency in pitch, no bruits, no rebound, no guarding, no midline pulsatile mass, no hepatomegaly, no splenomegaly EXT:  2 plus pulses throughout, no edema, no cyanosis no clubbing SKIN:  No rashes no nodules NEURO:  Cranial nerves II through XII grossly intact, motor grossly intact throughout PSYCH:  Cognitively intact, oriented to person place and time   Labs    High Sensitivity Troponin:   Recent Labs  Lab 01/22/21 1745 01/22/21 2054  TROPONINIHS 28* 27*      Chemistry Recent Labs  Lab 01/22/21 1745 01/23/21 0401 01/24/21 0349 01/25/21 0428  NA 137 134* 134* 138  K 2.9* 3.0* 3.5 4.1  CL 101 99 103 103  CO2 26 22 21* 25  GLUCOSE 195* 198* 211* 191*  BUN 51* 53* 56* 50*  CREATININE 2.09* 2.13* 1.99* 1.89*  CALCIUM 8.6*  8.6* 8.7* 8.6*  PROT 5.6*  --   --   --   ALBUMIN 3.0*  --   --   --   AST 34  --   --   --   ALT 35  --   --   --   ALKPHOS 93  --   --   --   BILITOT 1.7*  --   --   --   GFRNONAA 23* 22* 24* 26*  ANIONGAP 10 13 10 10      Hematology Recent Labs  Lab 01/23/21 0401 01/24/21 0349 01/25/21 0428  WBC 15.3* 14.8* 13.9*  RBC 3.88 3.54* 3.59*  HGB 11.4* 10.8* 10.7*  HCT 35.9* 33.2* 33.8*  MCV 92.5 93.8 94.2  MCH 29.4 30.5 29.8  MCHC 31.8 32.5 31.7  RDW 14.6 14.5 14.6  PLT 165 165 191    BNP Recent Labs  Lab 01/22/21 1745  BNP 464.4*     DDimer  Recent Labs  Lab 01/22/21 2054  DDIMER 2.44*     Radiology    NM Pulmonary Perf and Vent  Result Date: 01/23/2021 CLINICAL DATA:  Dyspnea for several months with lytic sources. EXAM: NUCLEAR MEDICINE PERFUSION LUNG SCAN TECHNIQUE: Perfusion images were obtained in multiple projections after intravenous  injection of radiopharmaceutical. Ventilation scans intentionally deferred if perfusion scan and chest x-ray adequate for interpretation during COVID 19 epidemic. RADIOPHARMACEUTICALS:  4.4 mCi Tc-23m MAA IV COMPARISON:  Chest radiograph from one day prior. FINDINGS: No significant pulmonary perfusion defects. Inferior midline and medial left lower chest defect correlates with mild cardiomegaly as seen on chest radiograph. IMPRESSION: No evidence of pulmonary embolism on perfusion only scan. Electronically Signed   By: Ilona Sorrel M.D.   On: 01/23/2021 12:13   DG CHEST PORT 1 VIEW  Result Date: 01/24/2021 CLINICAL DATA:  Dyspnea EXAM: PORTABLE CHEST 1 VIEW COMPARISON:  01/22/2021 chest radiograph. FINDINGS: Stable cardiomediastinal silhouette with mild cardiomegaly. No pneumothorax. Trace bilateral pleural effusions. Stable calcified 8 mm right costophrenic angle granuloma. Mild diffuse prominence and haziness of the parahilar interstitial markings, suggesting mild pulmonary edema. IMPRESSION: Mild congestive heart failure with trace bilateral pleural effusions. Electronically Signed   By: Ilona Sorrel M.D.   On: 01/24/2021 09:35   ECHOCARDIOGRAM COMPLETE  Result Date: 01/25/2021    ECHOCARDIOGRAM REPORT   Patient Name:   Briana [name] Date of Exam: 01/24/2021 Medical Rec #:  [id number]     Height:       61.0 in Accession #:    [id number]    Weight:       165.1 lb Date of Birth:  [birth year]     BSA:          1.741 m Patient Age:    87 years      BP:           119/54 mmHg Patient Gender: F             HR:           50 bpm. Exam Location:  Inpatient Procedure: 2D Echo, Cardiac Doppler and Color Doppler Indications:    H54.56 Chronic systolic (congestive) heart failure  History:        Patient has prior history of Echocardiogram examinations, most                 recent 11/22/2018. Signs/Symptoms:Murmur; Risk                 Factors:Dyslipidemia and GERD. Cancer.  Sonographer:    Jonelle Sidle Dance Referring Phys: [id number]  ANAND D HONGALGI IMPRESSIONS  1. Left ventricular ejection fraction, by estimation, is 55%. The left ventricle has normal function. The left ventricle has no regional wall motion abnormalities. There is mild left ventricular hypertrophy. Left ventricular diastolic parameters are indeterminate.  2. Right ventricular systolic function is normal. The right ventricular size is mildly enlarged. There is moderately elevated pulmonary artery systolic pressure. The estimated right ventricular systolic pressure is 48.8 mmHg.  3. Left atrial size was moderately dilated.  4. Right atrial size was mildly dilated.  5. The mitral valve is grossly normal. Mild to moderate mitral valve regurgitation. No evidence of mitral stenosis.  6. The aortic valve is grossly normal. Aortic valve regurgitation is not visualized. No aortic stenosis is present.  7. The inferior vena cava is dilated in size with <50% respiratory variability, suggesting right atrial pressure of 15 mmHg. FINDINGS  Left Ventricle: Left ventricular ejection fraction, by estimation, is 55%. The left ventricle has normal function. The left ventricle has no regional wall motion abnormalities. The left ventricular internal cavity size was normal in size. There is mild left ventricular hypertrophy. Left ventricular diastolic parameters are indeterminate. Right Ventricle: The right ventricular size is mildly enlarged. No increase in right ventricular wall thickness. Right ventricular systolic function is normal. There is moderately elevated pulmonary artery systolic pressure. The tricuspid regurgitant velocity is 3.10 m/s, and with an assumed right atrial pressure of 15 mmHg, the estimated right ventricular systolic pressure is 89.1 mmHg. Left Atrium: Left atrial size was moderately dilated. Right Atrium: Right atrial size was mildly dilated. Pericardium: Trivial pericardial effusion is present. Mitral Valve: The mitral valve is grossly normal. Mild to moderate mitral valve  regurgitation. No evidence of mitral valve stenosis. The mean mitral valve gradient is 1.6 mmHg with average heart rate of 50 bpm. Tricuspid Valve: The tricuspid valve is normal in structure. Tricuspid valve regurgitation is trivial. No evidence of tricuspid stenosis. Aortic Valve: The aortic valve is grossly normal. Aortic valve regurgitation is not visualized. No aortic stenosis is present. Pulmonic Valve: The pulmonic valve was not well visualized. Pulmonic valve regurgitation is trivial. No evidence of pulmonic stenosis. Aorta: The aortic root is normal in size and structure. Venous: The inferior vena cava is dilated in size with less than 50% respiratory variability, suggesting right atrial pressure of 15 mmHg. IAS/Shunts: No atrial level shunt detected by color flow Doppler.  LEFT VENTRICLE PLAX 2D LVIDd:         4.33 cm  Diastology LVIDs:         2.89 cm  LV e' medial:    5.22 cm/s LV PW:         1.11 cm  LV E/e' medial:  23.9 LV IVS:        1.28 cm  LV e' lateral:   9.45 cm/s LVOT diam:     2.00 cm  LV E/e' lateral: 13.2 LV SV:         61 LV SV Index:   35 LVOT Area:     3.14 cm  RIGHT VENTRICLE             IVC RV Basal diam:  3.31 cm     IVC diam: 2.24 cm RV Mid diam:    1.83 cm RV S prime:     11.70 cm/s TAPSE (M-mode): 2.1 cm LEFT ATRIUM           Index LA diam:  4.90 cm 2.81 cm/m LA Vol (A2C): 78.0 ml 44.80 ml/m LA Vol (A4C): 88.4 ml 50.81 ml/m  AORTIC VALVE LVOT Vmax:   91.40 cm/s LVOT Vmean:  63.000 cm/s LVOT VTI:    0.195 m  AORTA Ao Root diam: 2.80 cm Ao Asc diam:  3.20 cm MITRAL VALVE                 TRICUSPID VALVE MV Area (PHT): 2.29 cm      TR Peak grad:   38.4 mmHg MV Mean grad:  1.6 mmHg      TR Vmax:        310.00 cm/s MV Decel Time: 331 msec MR Peak grad:    82.1 mmHg   SHUNTS MR Mean grad:    58.0 mmHg   Systemic VTI:  0.20 m MR Vmax:         453.00 cm/s Systemic Diam: 2.00 cm MR Vmean:        359.0 cm/s MR PISA:         0.57 cm MR PISA Eff ROA: 5 mm MR PISA Radius:  0.30 cm MV E  velocity: 124.50 cm/s Cherlynn Kaiser MD Electronically signed by Cherlynn Kaiser MD Signature Date/Time: 01/25/2021/6:19:46 AM    Final     Cardiac Studies   Echo 01/24/21: 1. Left ventricular ejection fraction, by estimation, is 55%. The left  ventricle has normal function. The left ventricle has no regional wall  motion abnormalities. There is mild left ventricular hypertrophy. Left  ventricular diastolic parameters are  indeterminate.  2. Right ventricular systolic function is normal. The right ventricular  size is mildly enlarged. There is moderately elevated pulmonary artery  systolic pressure. The estimated right ventricular systolic pressure is  67.1 mmHg.  3. Left atrial size was moderately dilated.  4. Right atrial size was mildly dilated.  5. The mitral valve is grossly normal. Mild to moderate mitral valve  regurgitation. No evidence of mitral stenosis.  6. The aortic valve is grossly normal. Aortic valve regurgitation is not  visualized. No aortic stenosis is present.  7. The inferior vena cava is dilated in size with <50% respiratory  variability, suggesting right atrial pressure of 15 mmHg.   Patient Profile     87 y.o. female with chronic diastolic heart failure, paroxysmal atrial fibrillation, hypertension, hyperlipidemia admitted with acute on chronic diastolic heart failure and acute renal failure as well as UTI.  Assessment & Plan    # Acute on chronic diastolic heart failure: # Hypertension: Yesterday she was net -1 L.  Renal function is improving with diuresis.  She does not appear very volume overloaded right now.  We will continue with Lasix 40 mg IV twice daily for now.  Right atrial pressure was 15 mmHg on echo yesterday.  May be able to switch to oral tomorrow.  Her home olmesartan is on hold in the setting of acute renal failure.  Amlodipine has also be held due to low blood pressure.  #Paroxysmal atrial fibrillation: #Bradycardia:  Patient  currently in sinus rhythm.  Bradycardic in the 40s to 50s.  When I saw her she was standing at the sink trying to wash up.  Her heart rate was 48.  She reported feeling fatigued and lightheaded.  Also presented with an episode of syncope.  She stood up and then found herself on the ground.  Although she does have a UTI and acute on chronic diastolic heart failure, I do suspect that her bradycardia is contributing.  She is on amiodarone for atrial fibrillation.  Yesterday she received a dose of nadolol.  On admission her heart rate was 60.  Today it has been mostly 40s to 58s.  Continue to hold nadolol and if her bradycardia persists we will ask EP to see her.       For questions or updates, please contact [place] Please consult [email or web address] for contact info under        Signed, Skeet Latch, MD  01/25/2021, 10:54 AM

## 2021-01-26 DIAGNOSIS — I4819 Other persistent atrial fibrillation: Secondary | ICD-10-CM

## 2021-01-26 LAB — BASIC METABOLIC PANEL
Anion gap: 11 (ref 5–15)
BUN: 45 mg/dL — ABNORMAL HIGH (ref 8–23)
CO2: 27 mmol/L (ref 22–32)
Calcium: 8.7 mg/dL — ABNORMAL LOW (ref 8.9–10.3)
Chloride: 100 mmol/L (ref 98–111)
Creatinine, Ser: 1.86 mg/dL — ABNORMAL HIGH (ref 0.44–1.00)
GFR, Estimated: 26 mL/min — ABNORMAL LOW (ref >60)
Glucose, Bld: 203 mg/dL — ABNORMAL HIGH (ref 70–99)
Potassium: 3.9 mmol/L (ref 3.5–5.1)
Sodium: 138 mmol/L (ref 135–145)

## 2021-01-26 LAB — GLUCOSE, CAPILLARY
Glucose-Capillary: 238 mg/dL — ABNORMAL HIGH (ref 70–99)
Glucose-Capillary: 131 mg/dL — ABNORMAL HIGH (ref 70–99)
Glucose-Capillary: 173 mg/dL — ABNORMAL HIGH (ref 70–99)
Glucose-Capillary: 176 mg/dL — ABNORMAL HIGH (ref 70–99)

## 2021-01-26 MED ORDER — FUROSEMIDE 10 MG/ML IJ SOLN
80.0000 mg | Freq: Two times a day (BID) | INTRAMUSCULAR | Status: DC
  Administered 2021-01-26 – 2021-01-28 (×4): 80 mg via INTRAVENOUS
  Filled 2021-01-26 (×4): qty 8

## 2021-01-26 NOTE — Plan of Care (Signed)

## 2021-01-26 NOTE — Progress Notes (Signed)
PROGRESS NOTE   Briana [name]  EZM:[id number]    DOB: 22-Mar-1934    DOA: 01/22/2021  PCP: Unk Pinto, MD   I have briefly reviewed patients previous medical records in [facility].  Chief Complaint  Patient presents with  . Fall    Brief Narrative:  87 year old female, lives alone, ambulates with a walker x2 weeks, not on home oxygen, medical history significant for HTN, persistent atrial fibrillation, chronic diastolic CHF, GERD, hyperlipidemia presented to the ED due to weakness and falls.  Over the last 2 weeks, due to worsening leg edema/tightness and dyspnea, as per her physician instruction she took a couple days of extra doses of Bumex.  Admitted for acute on chronic kidney disease, transient hypoxia with associated generalized weakness and falls.  Treated with IV antibiotics for about 24 hours with minimal improvement in creatinine but developed worsening dyspnea due to CHF.  IV fluids discontinued.  Cardiology and nephrology consulted.   Assessment & Plan:  Principal Problem:   AKI (acute kidney injury) ([place]) Active Problems:   Essential hypertension   Unspecified atrial fibrillation (HCC)   Chronic diastolic heart failure (HCC)   Acquired thrombophilia ([place])   Acute kidney injury complicating CKD stage IIIa:  Baseline creatinine 0.9 in October 2021.  Presented with creatinine of 2.09.  Acute kidney injury suspected due to recent increased dose of Bumex, ARB and some GI losses.  ARB held.   Renal ultrasound: No hydronephrosis.  She was briefly hydrated with IV fluids for about 24 hours, creatinine marginally improved but she developed dyspnea and decompensated CHF.  IV fluids were discontinued.  Nephrology consulted.  AKI likely secondary to hemodynamics and ARB.  Started IV Lasix 40 mg every 12 hours.  Creatinine has improved from 1.99-1.89 >1.86.  Making good urine.  Urine output 2400 mL yesterday.  Ongoing some exertional dyspnea and Cardiology has  increased IV Lasix to 80 mg every 12 hours.  Follow BMP in a.m.  Hypokalemia:  Replaced.  Magnesium 2.2.  Pansensitive E. coli acute cystitis  Urine culture shows >100 K colonies of E. coli  Completed course with 3 days of IV ceftriaxone  Did have some dysuria PTA she says but seems to have resolved.  Also reported chills PTA.  Leukocytosis  May be reactive.?  Recent outpatient steroid use.  Improving.  Generalized weakness/debility and falls  Secondary to dehydration, acute kidney injury complicating advanced age and frailty.  No syncope.  Therapies evaluation recommend home health PT and OT at discharge.  Acute on chronic diastolic CHF:  2D echo 5/0/3546: LVEF 60-65%.  Repeat 2D echo 3/11: LVEF 55% without regional wall motion abnormalities.  Follows with Dr. Claiborne Billings, Cardiology.  As noted above, diuretics held on admission due to AKI and dehydration, briefly hydrated with IV fluids which precipitated acute CHF  Cardiology follow-up appreciated, have increased IV Lasix to 80 mg every 12 hours due to ongoing exertional dyspnea.  Not much peripheral edema.  Persistent atrial fibrillation  Continue amiodarone and reduced dose of apixaban due to AKI.  Currently in sinus rhythm  Nadolol discontinued since 3/11 due to sinus bradycardia and need for better blood pressures.  Cardiology following and indicated that nadolol effect may take a couple days to washout.  Bradycardia marginally better, now mostly in the 50s and some high 40s.  Syncope  Sinus bradycardia on telemetry.  Echo results as above.  If driving, patient to be counseled that she should not drive for 6 months.  Transient  hypoxia/acute hypoxic respiratory failure  D-dimer up to 2.44.  No evidence of pulmonary embolism on perfusion only scan.  Incentive spirometry and wean oxygen as tolerated.  Essential hypertension:  Holding ARB due to AKI, amlodipine and nadolol  Controlled.  Prolonged  QTC  Difficult to accurately interpret in the context of BBB morphology.  Replaced hypokalemia.  Magnesium 2.2.  Avoid QT prolonging medications.  Already on amiodarone.  Type II DM, newly diagnosed with hyperglycemia  Follow CBGs and SSI if needed.  A1c 6.8.  Multiple bilateral benign exophytic renal cysts  Noted on renal ultrasound and as per radiologist, no follow-up or characterization required.  Anemia  Possibly chronic disease.  Stable.  Body mass index is 30.91 kg/m.  Nutritional Status Nutrition Problem: Inadequate oral intake Etiology: poor appetite Signs/Symptoms: per patient/family report Interventions: Ensure Enlive (each supplement provides 350kcal and 20 grams of protein),MVI    DVT prophylaxis: apixaban (ELIQUIS) tablet 2.5 mg Start: 01/23/21 2200  On apixaban   Code Status: Full Code Family Communication: I discussed with patient's daughter via phone on 3/12, updated care and answered questions.  None at bedside today Disposition:  Status is: Inpatient  Remains inpatient appropriate because:Inpatient level of care appropriate due to severity of illness   Dispo: The patient is from: Home              Anticipated d/c is to: Home with home health services              Patient currently is not medically stable to d/c.   Difficult to place patient No        Consultants:   Nephrology Cardiology  Procedures:   None  Antimicrobials:    Anti-infectives (From admission, onward)   Start     Dose/Rate Route Frequency Ordered Stop   01/23/21 0645  cefTRIAXone (ROCEPHIN) 1 g in sodium chloride 0.9 % 100 mL IVPB  Status:  Discontinued        1 g 200 mL/hr over 30 Minutes Intravenous Every 24 hours 01/23/21 0546 01/25/21 0628        Subjective:  Still in bed this morning.  Reports feeling better.  States dyspnea better but not resolved.  Ambulated 200 feet on 2 L with RN and family yesterday afternoon.  Objective:   Vitals:   01/25/21 1235  01/25/21 1311 01/25/21 2229 01/26/21 0557  BP: 122/64 113/68 (!) 124/58 (!) 114/56  Pulse: (!) 49 (!) 55 (!) 48 (!) 50  Resp: 18 14 18 20   Temp: 98.6 F (37 C) 98.3 F (36.8 C) 98 F (36.7 C) 98.7 F (37.1 C)  TempSrc: Oral     SpO2: 97% 99% 95% 94%  Weight:    74.2 kg  Height:        General exam: Pleasant elderly female, moderately built and thinly nourished, lying comfortably propped up in bed without distress.  However gets mildly dyspneic on prolonged speaking. Respiratory system: Occasional basal crackles but otherwise clear to auscultation.  No increased work of breathing. Cardiovascular system: S1 and S2 heard, RRR.  No JVD or murmurs.  No pedal edema.  Telemetry personally reviewed: SB mostly in the 50s, occasionally in the high 40s/48 etc. Gastrointestinal system: Abdomen is nondistended, soft and nontender. No organomegaly or masses felt. Normal bowel sounds heard. Central nervous system: Alert and oriented. No focal neurological deficits. Extremities: Symmetric 5 x 5 power. Skin: No rashes, lesions or ulcers Psychiatry: Judgement and insight appear somewhat impaired. Mood & affect appropriate.  Data Reviewed:   I have personally reviewed following labs and imaging studies   CBC: Recent Labs  Lab 01/22/21 1745 01/23/21 0401 01/24/21 0349 01/25/21 0428  WBC 15.6* 15.3* 14.8* 13.9*  NEUTROABS 13.6*  --   --   --   HGB 11.8* 11.4* 10.8* 10.7*  HCT 36.0 35.9* 33.2* 33.8*  MCV 91.4 92.5 93.8 94.2  PLT 187 165 165 448    Basic Metabolic Panel: Recent Labs  Lab 01/22/21 2054 01/23/21 0401 01/24/21 0349 01/25/21 0428 01/26/21 0422  NA  --    < > 134* 138 138  K  --    < > 3.5 4.1 3.9  CL  --    < > 103 103 100  CO2  --    < > 21* 25 27  GLUCOSE  --    < > 211* 191* 203*  BUN  --    < > 56* 50* 45*  CREATININE  --    < > 1.99* 1.89* 1.86*  CALCIUM  --    < > 8.7* 8.6* 8.7*  MG 2.2  --   --   --   --    < > = values in this interval not displayed.     Liver Function Tests: Recent Labs  Lab 01/22/21 1745  AST 34  ALT 35  ALKPHOS 93  BILITOT 1.7*  PROT 5.6*  ALBUMIN 3.0*    CBG: Recent Labs  Lab 01/25/21 2227 01/26/21 0751 01/26/21 1127  GLUCAP 225* 238* 131*    Microbiology Studies:   Recent Results (from the past 240 hour(s))  Resp Panel by RT-PCR (Flu A&B, Covid) Nasopharyngeal Swab     Status: None   Collection Time: 01/22/21  8:04 PM   Specimen: Nasopharyngeal Swab; Nasopharyngeal(NP) swabs in vial transport medium  Result Value Ref Range Status   SARS Coronavirus 2 by RT PCR NEGATIVE NEGATIVE Final    Comment: (NOTE) SARS-CoV-2 target nucleic acids are NOT DETECTED.  The SARS-CoV-2 RNA is generally detectable in upper respiratory specimens during the acute phase of infection. The lowest concentration of SARS-CoV-2 viral copies this assay can detect is 138 copies/mL. A negative result does not preclude SARS-Cov-2 infection and should not be used as the sole basis for treatment or other patient management decisions. A negative result may occur with  improper specimen collection/handling, submission of specimen other than nasopharyngeal swab, presence of viral mutation(s) within the areas targeted by this assay, and inadequate number of viral copies(<138 copies/mL). A negative result must be combined with clinical observations, patient history, and epidemiological information. The expected result is Negative.  Fact Sheet for Patients:  EntrepreneurPulse.com.au  Fact Sheet for Healthcare Providers:  IncredibleEmployment.be  This test is no t yet approved or cleared by the [place] FDA and  has been authorized for detection and/or diagnosis of SARS-CoV-2 by FDA under an Emergency Use Authorization (EUA). This EUA will remain  in effect (meaning this test can be used) for the duration of the COVID-19 declaration under Section 564(b)(1) of the Act, 21 U.S.C.section  360bbb-3(b)(1), unless the authorization is terminated  or revoked sooner.       Influenza A by PCR NEGATIVE NEGATIVE Final   Influenza B by PCR NEGATIVE NEGATIVE Final    Comment: (NOTE) The Xpert Xpress SARS-CoV-2/FLU/RSV plus assay is intended as an aid in the diagnosis of influenza from Nasopharyngeal swab specimens and should not be used as a sole basis for treatment. Nasal washings and aspirates are  unacceptable for Xpert Xpress SARS-CoV-2/FLU/RSV testing.  Fact Sheet for Patients: EntrepreneurPulse.com.au  Fact Sheet for Healthcare Providers: IncredibleEmployment.be  This test is not yet approved or cleared by the [place] FDA and has been authorized for detection and/or diagnosis of SARS-CoV-2 by FDA under an Emergency Use Authorization (EUA). This EUA will remain in effect (meaning this test can be used) for the duration of the COVID-19 declaration under Section 564(b)(1) of the Act, 21 U.S.C. section 360bbb-3(b)(1), unless the authorization is terminated or revoked.  Performed at [facility], [place] [address], [place], [place] [address]   Culture, Urine     Status: Abnormal   Collection Time: 01/22/21 10:30 PM   Specimen: Urine, Clean Catch  Result Value Ref Range Status   Specimen Description   Final    URINE, CLEAN CATCH Performed at [facility], [place] [address], [place], [place] [address]    Special Requests   Final    NONE Performed at [facility], [place] [address] [place], [place] [address]    Culture >=100,000 COLONIES/mL ESCHERICHIA COLI (A)  Final   Report Status 01/25/2021 FINAL  Final   Organism ID, Bacteria ESCHERICHIA COLI (A)  Final      Susceptibility   Escherichia coli - MIC*    AMPICILLIN 4 SENSITIVE Sensitive     CEFAZOLIN <=4 SENSITIVE Sensitive     CEFEPIME <=0.12 SENSITIVE Sensitive     CEFTRIAXONE <=0.25 SENSITIVE Sensitive      CIPROFLOXACIN <=0.25 SENSITIVE Sensitive     GENTAMICIN <=1 SENSITIVE Sensitive     IMIPENEM <=0.25 SENSITIVE Sensitive     NITROFURANTOIN <=16 SENSITIVE Sensitive     TRIMETH/SULFA <=20 SENSITIVE Sensitive     AMPICILLIN/SULBACTAM <=2 SENSITIVE Sensitive     PIP/TAZO <=4 SENSITIVE Sensitive     * >=100,000 COLONIES/mL ESCHERICHIA COLI     Radiology Studies:  ECHOCARDIOGRAM COMPLETE  Result Date: 01/25/2021    ECHOCARDIOGRAM REPORT   Patient Name:   Briana [name] Date of Exam: 01/24/2021 Medical Rec #:  [id number]     Height:       61.0 in Accession #:    [id number]    Weight:       165.1 lb Date of Birth:  01-12-1934     BSA:          1.741 m Patient Age:    77 years      BP:           119/54 mmHg Patient Gender: F             HR:           50 bpm. Exam Location:  Inpatient Procedure: 2D Echo, Cardiac Doppler and Color Doppler Indications:    M38.46 Chronic systolic (congestive) heart failure  History:        Patient has prior history of Echocardiogram examinations, most                 recent 11/22/2018. Signs/Symptoms:Murmur; Risk                 Factors:Dyslipidemia and GERD. Cancer.  Sonographer:    Jonelle Sidle Dance Referring Phys: [id number] [place] IMPRESSIONS  1. Left ventricular ejection fraction, by estimation, is 55%. The left ventricle has normal function. The left ventricle has no regional wall motion abnormalities. There is mild left ventricular hypertrophy. Left ventricular diastolic parameters are indeterminate.  2. Right ventricular systolic function is normal. The right  ventricular size is mildly enlarged. There is moderately elevated pulmonary artery systolic pressure. The estimated right ventricular systolic pressure is 47.8 mmHg.  3. Left atrial size was moderately dilated.  4. Right atrial size was mildly dilated.  5. The mitral valve is grossly normal. Mild to moderate mitral valve regurgitation. No evidence of mitral stenosis.  6. The aortic valve is grossly normal. Aortic valve  regurgitation is not visualized. No aortic stenosis is present.  7. The inferior vena cava is dilated in size with <50% respiratory variability, suggesting right atrial pressure of 15 mmHg. FINDINGS  Left Ventricle: Left ventricular ejection fraction, by estimation, is 55%. The left ventricle has normal function. The left ventricle has no regional wall motion abnormalities. The left ventricular internal cavity size was normal in size. There is mild left ventricular hypertrophy. Left ventricular diastolic parameters are indeterminate. Right Ventricle: The right ventricular size is mildly enlarged. No increase in right ventricular wall thickness. Right ventricular systolic function is normal. There is moderately elevated pulmonary artery systolic pressure. The tricuspid regurgitant velocity is 3.10 m/s, and with an assumed right atrial pressure of 15 mmHg, the estimated right ventricular systolic pressure is 29.5 mmHg. Left Atrium: Left atrial size was moderately dilated. Right Atrium: Right atrial size was mildly dilated. Pericardium: Trivial pericardial effusion is present. Mitral Valve: The mitral valve is grossly normal. Mild to moderate mitral valve regurgitation. No evidence of mitral valve stenosis. The mean mitral valve gradient is 1.6 mmHg with average heart rate of 50 bpm. Tricuspid Valve: The tricuspid valve is normal in structure. Tricuspid valve regurgitation is trivial. No evidence of tricuspid stenosis. Aortic Valve: The aortic valve is grossly normal. Aortic valve regurgitation is not visualized. No aortic stenosis is present. Pulmonic Valve: The pulmonic valve was not well visualized. Pulmonic valve regurgitation is trivial. No evidence of pulmonic stenosis. Aorta: The aortic root is normal in size and structure. Venous: The inferior vena cava is dilated in size with less than 50% respiratory variability, suggesting right atrial pressure of 15 mmHg. IAS/Shunts: No atrial level shunt detected by color  flow Doppler.  LEFT VENTRICLE PLAX 2D LVIDd:         4.33 cm  Diastology LVIDs:         2.89 cm  LV e' medial:    5.22 cm/s LV PW:         1.11 cm  LV E/e' medial:  23.9 LV IVS:        1.28 cm  LV e' lateral:   9.45 cm/s LVOT diam:     2.00 cm  LV E/e' lateral: 13.2 LV SV:         61 LV SV Index:   35 LVOT Area:     3.14 cm  RIGHT VENTRICLE             IVC RV Basal diam:  3.31 cm     IVC diam: 2.24 cm RV Mid diam:    1.83 cm RV S prime:     11.70 cm/s TAPSE (M-mode): 2.1 cm LEFT ATRIUM           Index LA diam:      4.90 cm 2.81 cm/m LA Vol (A2C): 78.0 ml 44.80 ml/m LA Vol (A4C): 88.4 ml 50.81 ml/m  AORTIC VALVE LVOT Vmax:   91.40 cm/s LVOT Vmean:  63.000 cm/s LVOT VTI:    0.195 m  AORTA Ao Root diam: 2.80 cm Ao Asc diam:  3.20 cm MITRAL VALVE  TRICUSPID VALVE MV Area (PHT): 2.29 cm      TR Peak grad:   38.4 mmHg MV Mean grad:  1.6 mmHg      TR Vmax:        310.00 cm/s MV Decel Time: 331 msec MR Peak grad:    82.1 mmHg   SHUNTS MR Mean grad:    58.0 mmHg   Systemic VTI:  0.20 m MR Vmax:         453.00 cm/s Systemic Diam: 2.00 cm MR Vmean:        359.0 cm/s MR PISA:         0.57 cm MR PISA Eff ROA: 5 mm MR PISA Radius:  0.30 cm MV E velocity: 124.50 cm/s Cherlynn Kaiser MD Electronically signed by Cherlynn Kaiser MD Signature Date/Time: 01/25/2021/6:19:46 AM    Final      Scheduled Meds:   . amiodarone  200 mg Oral Daily  . apixaban  2.5 mg Oral BID  . feeding supplement  237 mL Oral BID BM  . furosemide  80 mg Intravenous Q12H  . insulin aspart  0-6 Units Subcutaneous TID WC  . multivitamin with minerals  1 tablet Oral Daily    Continuous Infusions:      LOS: 3 days     Vernell Leep, MD, [place], [facility]. [facility]    To contact the attending provider between 7A-7P or the covering provider during after hours 7P-7A, please log into the web site [email or web address] and access using universal [place] password for that web site. If you do not have the password, please  call the hospital operator.  01/26/2021, 12:37 PM

## 2021-01-26 NOTE — Progress Notes (Signed)
[facility] Progress Note  Subjective: no new c/o, UOP Yest 2400. Creat stable at 1.8 today.    Vitals:   01/25/21 1235 01/25/21 1311 01/25/21 2229 01/26/21 0557  BP: 122/64 113/68 (!) 124/58 (!) 114/56  Pulse: (!) 49 (!) 55 (!) 48 (!) 50  Resp: 18 14 18 20   Temp: 98.6 F (37 C) 98.3 F (36.8 C) 98 F (36.7 C) 98.7 F (37.1 C)  TempSrc: Oral     SpO2: 97% 99% 95% 94%  Weight:    74.2 kg  Height:        Exam: Gen elderly WF, off of O2 this am No jvd Chest L base rales mild persist, R clear RRR no MRG Abd soft ntnd no mass or ascites +bs Ext no pitting LE or hip edema Neuro is alert, Ox 3 , nf     Home meds:  - norvasc 7.5 qd/ bumex 1mg  on m-f / nadolol 10-20 mg qd/ benicar 40 qd  - eliquis 5 bid/ amio 200 qd  - prn's/ vitamins/ supplements    UA 3/9 - 100 prot, few bac, >50 wbc, 0-5 rbc   UCx +>100,000 EColi    Renal US 3/10 - 10- 12 cm kidneys no hydro.   Assessment/ Plan: 1. AKI - baseline creat 0.9 from Oct 2021. Pt admitted for falls and possible dehydration, given IVF's and developed SOB and +CXR for pulm edema. IVF"s dc'd and IV lasix started. UA negative, renal US w/o obstruction. AKI due to decomp CHF most likely . Cont to hold ARB.  Creat 2.0 on admit, stable at 1.8 today. No further suggestions. Will set up outpt f/u for this patient in the office. Will sign off.  2. A/C diast CHF - lasix IV ^'d to 80 bid per cardiology. Holding ARB.  3. EColi UTI - getting Rocephin.  4. Atrial fib - long hx per family , on eliquis and BB. BB on hold for low HR 5. Bradycardia - nadolol on hold, cardiology following       Kelly Splinter 01/26/2021, 8:49 AM   Recent Labs  Lab 01/24/21 0349 01/25/21 0428 01/26/21 0422  K 3.5 4.1 3.9  BUN 56* 50* 45*  CREATININE 1.99* 1.89* 1.86*  CALCIUM 8.7* 8.6* 8.7*  HGB 10.8* 10.7*  --    Inpatient medications: . amiodarone  200 mg Oral Daily  . apixaban  2.5 mg Oral BID  . feeding supplement  237 mL  Oral BID BM  . furosemide  40 mg Intravenous Q12H  . insulin aspart  0-6 Units Subcutaneous TID WC  . multivitamin with minerals  1 tablet Oral Daily    albuterol

## 2021-01-26 NOTE — Progress Notes (Signed)
Progress Note  Patient Name: Briana [name] Date of Encounter: 01/26/2021  [facility] Cardiologist: Shelva Majestic, MD   Subjective   Continued exertional dyspnea.  She is unsure if it is changed from yesterday. Lightheadedness seems to be improving but she hasn't been up much.   Inpatient Medications    Scheduled Meds: . amiodarone  200 mg Oral Daily  . apixaban  2.5 mg Oral BID  . feeding supplement  237 mL Oral BID BM  . furosemide  40 mg Intravenous Q12H  . insulin aspart  0-6 Units Subcutaneous TID WC  . multivitamin with minerals  1 tablet Oral Daily   Continuous Infusions:  PRN Meds: albuterol   Vital Signs    Vitals:   01/25/21 1235 01/25/21 1311 01/25/21 2229 01/26/21 0557  BP: 122/64 113/68 (!) 124/58 (!) 114/56  Pulse: (!) 49 (!) 55 (!) 48 (!) 50  Resp: 18 14 18 20   Temp: 98.6 F (37 C) 98.3 F (36.8 C) 98 F (36.7 C) 98.7 F (37.1 C)  TempSrc: Oral     SpO2: 97% 99% 95% 94%  Weight:    74.2 kg  Height:        Intake/Output Summary (Last 24 hours) at 01/26/2021 0923 Last data filed at 01/26/2021 0444 Gross per 24 hour  Intake 560 ml  Output 1400 ml  Net -840 ml   Last 3 Weights 01/26/2021 01/24/2021 01/23/2021  Weight (lbs) 163 lb 9.3 oz 165 lb 1.6 oz 158 lb 15.2 oz  Weight (kg) 74.2 kg 74.889 kg 72.1 kg      Telemetry    Sinus bradycardia. 40s-50s - Personally Reviewed  ECG    01/22/2021: Sinus rhythm.  Rate 60 bpm.  Right bundle branch block. - Personally Reviewed  Physical Exam   VS:  BP (!) 114/56 (BP Location: Left Arm)   Pulse (!) 50   Temp 98.7 F (37.1 C)   Resp 20   Ht 5\' 1"  (1.549 m)   Wt 74.2 kg   SpO2 94%   BMI 30.91 kg/m  , BMI Body mass index is 30.91 kg/m. GENERAL:  Well appearing HEENT: Pupils equal round and reactive, fundi not visualized, oral mucosa unremarkable NECK:  + jugular venous distention, waveform within normal limits, carotid upstroke brisk and symmetric, no bruits LUNGS:  Clear to auscultation  bilaterally HEART:  Bradycardic.  Regular rhythm.  PMI not displaced or sustained,S1 and S2 within normal limits, no S3, no S4, no clicks, no rubs, no murmurs ABD:  Flat, positive bowel sounds normal in frequency in pitch, no bruits, no rebound, no guarding, no midline pulsatile mass, no hepatomegaly, no splenomegaly EXT:  2 plus pulses throughout, no edema, no cyanosis no clubbing SKIN:  No rashes no nodules NEURO:  Cranial nerves II through XII grossly intact, motor grossly intact throughout PSYCH:  Cognitively intact, oriented to person place and time   Labs    High Sensitivity Troponin:   Recent Labs  Lab 01/22/21 1745 01/22/21 2054  TROPONINIHS 28* 27*      Chemistry Recent Labs  Lab 01/22/21 1745 01/23/21 0401 01/24/21 0349 01/25/21 0428 01/26/21 0422  NA 137   < > 134* 138 138  K 2.9*   < > 3.5 4.1 3.9  CL 101   < > 103 103 100  CO2 26   < > 21* 25 27  GLUCOSE 195*   < > 211* 191* 203*  BUN 51*   < > 56* 50* 45*  CREATININE 2.09*   < > 1.99* 1.89* 1.86*  CALCIUM 8.6*   < > 8.7* 8.6* 8.7*  PROT 5.6*  --   --   --   --   ALBUMIN 3.0*  --   --   --   --   AST 34  --   --   --   --   ALT 35  --   --   --   --   ALKPHOS 93  --   --   --   --   BILITOT 1.7*  --   --   --   --   GFRNONAA 23*   < > 24* 26* 26*  ANIONGAP 10   < > 10 10 11    < > = values in this interval not displayed.     Hematology Recent Labs  Lab 01/23/21 0401 01/24/21 0349 01/25/21 0428  WBC 15.3* 14.8* 13.9*  RBC 3.88 3.54* 3.59*  HGB 11.4* 10.8* 10.7*  HCT 35.9* 33.2* 33.8*  MCV 92.5 93.8 94.2  MCH 29.4 30.5 29.8  MCHC 31.8 32.5 31.7  RDW 14.6 14.5 14.6  PLT 165 165 191    BNP Recent Labs  Lab 01/22/21 1745  BNP 464.4*     DDimer  Recent Labs  Lab 01/22/21 2054  DDIMER 2.44*     Radiology    ECHOCARDIOGRAM COMPLETE  Result Date: 01/25/2021    ECHOCARDIOGRAM REPORT   Patient Name:   NECHUMA BOVEN Date of Exam: 01/24/2021 Medical Rec #:  [id number]     Height:        61.0 in Accession #:    [id number]    Weight:       165.1 lb Date of Birth:  Nov 18, 1933     BSA:          1.741 m Patient Age:    87 years      BP:           119/54 mmHg Patient Gender: F             HR:           50 bpm. Exam Location:  Inpatient Procedure: 2D Echo, Cardiac Doppler and Color Doppler Indications:    W29.56 Chronic systolic (congestive) heart failure  History:        Patient has prior history of Echocardiogram examinations, most                 recent 11/22/2018. Signs/Symptoms:Murmur; Risk                 Factors:Dyslipidemia and GERD. Cancer.  Sonographer:    Jonelle Sidle Dance Referring Phys: [id number] ANAND D HONGALGI IMPRESSIONS  1. Left ventricular ejection fraction, by estimation, is 55%. The left ventricle has normal function. The left ventricle has no regional wall motion abnormalities. There is mild left ventricular hypertrophy. Left ventricular diastolic parameters are indeterminate.  2. Right ventricular systolic function is normal. The right ventricular size is mildly enlarged. There is moderately elevated pulmonary artery systolic pressure. The estimated right ventricular systolic pressure is 86.5 mmHg.  3. Left atrial size was moderately dilated.  4. Right atrial size was mildly dilated.  5. The mitral valve is grossly normal. Mild to moderate mitral valve regurgitation. No evidence of mitral stenosis.  6. The aortic valve is grossly normal. Aortic valve regurgitation is not visualized. No aortic stenosis is present.  7. The inferior vena cava is dilated in size with <50% respiratory  variability, suggesting right atrial pressure of 15 mmHg. FINDINGS  Left Ventricle: Left ventricular ejection fraction, by estimation, is 55%. The left ventricle has normal function. The left ventricle has no regional wall motion abnormalities. The left ventricular internal cavity size was normal in size. There is mild left ventricular hypertrophy. Left ventricular diastolic parameters are indeterminate. Right  Ventricle: The right ventricular size is mildly enlarged. No increase in right ventricular wall thickness. Right ventricular systolic function is normal. There is moderately elevated pulmonary artery systolic pressure. The tricuspid regurgitant velocity is 3.10 m/s, and with an assumed right atrial pressure of 15 mmHg, the estimated right ventricular systolic pressure is 95.6 mmHg. Left Atrium: Left atrial size was moderately dilated. Right Atrium: Right atrial size was mildly dilated. Pericardium: Trivial pericardial effusion is present. Mitral Valve: The mitral valve is grossly normal. Mild to moderate mitral valve regurgitation. No evidence of mitral valve stenosis. The mean mitral valve gradient is 1.6 mmHg with average heart rate of 50 bpm. Tricuspid Valve: The tricuspid valve is normal in structure. Tricuspid valve regurgitation is trivial. No evidence of tricuspid stenosis. Aortic Valve: The aortic valve is grossly normal. Aortic valve regurgitation is not visualized. No aortic stenosis is present. Pulmonic Valve: The pulmonic valve was not well visualized. Pulmonic valve regurgitation is trivial. No evidence of pulmonic stenosis. Aorta: The aortic root is normal in size and structure. Venous: The inferior vena cava is dilated in size with less than 50% respiratory variability, suggesting right atrial pressure of 15 mmHg. IAS/Shunts: No atrial level shunt detected by color flow Doppler.  LEFT VENTRICLE PLAX 2D LVIDd:         4.33 cm  Diastology LVIDs:         2.89 cm  LV e' medial:    5.22 cm/s LV PW:         1.11 cm  LV E/e' medial:  23.9 LV IVS:        1.28 cm  LV e' lateral:   9.45 cm/s LVOT diam:     2.00 cm  LV E/e' lateral: 13.2 LV SV:         61 LV SV Index:   35 LVOT Area:     3.14 cm  RIGHT VENTRICLE             IVC RV Basal diam:  3.31 cm     IVC diam: 2.24 cm RV Mid diam:    1.83 cm RV S prime:     11.70 cm/s TAPSE (M-mode): 2.1 cm LEFT ATRIUM           Index LA diam:      4.90 cm 2.81 cm/m LA  Vol (A2C): 78.0 ml 44.80 ml/m LA Vol (A4C): 88.4 ml 50.81 ml/m  AORTIC VALVE LVOT Vmax:   91.40 cm/s LVOT Vmean:  63.000 cm/s LVOT VTI:    0.195 m  AORTA Ao Root diam: 2.80 cm Ao Asc diam:  3.20 cm MITRAL VALVE                 TRICUSPID VALVE MV Area (PHT): 2.29 cm      TR Peak grad:   38.4 mmHg MV Mean grad:  1.6 mmHg      TR Vmax:        310.00 cm/s MV Decel Time: 331 msec MR Peak grad:    82.1 mmHg   SHUNTS MR Mean grad:    58.0 mmHg   Systemic VTI:  0.20 m MR Vmax:  453.00 cm/s Systemic Diam: 2.00 cm MR Vmean:        359.0 cm/s MR PISA:         0.57 cm MR PISA Eff ROA: 5 mm MR PISA Radius:  0.30 cm MV E velocity: 124.50 cm/s Cherlynn Kaiser MD Electronically signed by Cherlynn Kaiser MD Signature Date/Time: 01/25/2021/6:19:46 AM    Final     Cardiac Studies   Echo 01/24/21: 1. Left ventricular ejection fraction, by estimation, is 55%. The left  ventricle has normal function. The left ventricle has no regional wall  motion abnormalities. There is mild left ventricular hypertrophy. Left  ventricular diastolic parameters are  indeterminate.  2. Right ventricular systolic function is normal. The right ventricular  size is mildly enlarged. There is moderately elevated pulmonary artery  systolic pressure. The estimated right ventricular systolic pressure is  67.6 mmHg.  3. Left atrial size was moderately dilated.  4. Right atrial size was mildly dilated.  5. The mitral valve is grossly normal. Mild to moderate mitral valve  regurgitation. No evidence of mitral stenosis.  6. The aortic valve is grossly normal. Aortic valve regurgitation is not  visualized. No aortic stenosis is present.  7. The inferior vena cava is dilated in size with <50% respiratory  variability, suggesting right atrial pressure of 15 mmHg.   Patient Profile     87 y.o. female with chronic diastolic heart failure, paroxysmal atrial fibrillation, hypertension, hyperlipidemia admitted with acute on chronic  diastolic heart failure and acute renal failure as well as UTI.  Assessment & Plan    # Acute on chronic diastolic heart failure: # Hypertension: Yesterday she was net -877 mL.  Renal function is improving with diuresis.  She does not appear very volume overloaded right now but has continued exertional dyspnea.  Increase Lasix to 80 mg IV twice daily.  Right atrial pressure was 15 mmHg on echo yesterday.  Her home olmesartan is on hold in the setting of acute renal failure.  Amlodipine has also be held due to low blood pressure.  #Paroxysmal atrial fibrillation: #Bradycardia:  Patient currently in sinus rhythm but bradycardic initially to the 40-50s.  Today she is consistently in the 45s.  When I saw her on 3/12 she was standing at the sink trying to wash up.  Her heart rate was 48.  She reported feeling fatigued and lightheaded.  Also presented with an episode of syncope.  She stood up and then found herself on the ground.  Although she does have a UTI and acute on chronic diastolic heart failure, I do suspect that her bradycardia was contributing.  She is on amiodarone for atrial fibrillation.   Continue to hold nadolol and if her bradycardia persists we will ask EP to see her.  The half life of nadalol is quite long (20-24 hours).  Therefore we need about 3 days for it to be out of her system.  Last dose was 3/11.     For questions or updates, please contact [place] Please consult [email or web address] for contact info under        Signed, Skeet Latch, MD  01/26/2021, 9:23 AM

## 2021-01-27 LAB — BASIC METABOLIC PANEL
Anion gap: 12 (ref 5–15)
BUN: 40 mg/dL — ABNORMAL HIGH (ref 8–23)
CO2: 29 mmol/L (ref 22–32)
Calcium: 8.6 mg/dL — ABNORMAL LOW (ref 8.9–10.3)
Chloride: 99 mmol/L (ref 98–111)
Creatinine, Ser: 1.63 mg/dL — ABNORMAL HIGH (ref 0.44–1.00)
GFR, Estimated: 31 mL/min — ABNORMAL LOW (ref >60)
Glucose, Bld: 186 mg/dL — ABNORMAL HIGH (ref 70–99)
Potassium: 3.1 mmol/L — ABNORMAL LOW (ref 3.5–5.1)
Sodium: 140 mmol/L (ref 135–145)

## 2021-01-27 LAB — CBC
HCT: 35 % — ABNORMAL LOW (ref 36.0–46.0)
Hemoglobin: 11.6 g/dL — ABNORMAL LOW (ref 12.0–15.0)
MCH: 30.1 pg (ref 26.0–34.0)
MCHC: 33.1 g/dL (ref 30.0–36.0)
MCV: 90.7 fL (ref 80.0–100.0)
Platelets: 263 10*3/uL (ref 150–400)
RBC: 3.86 MIL/uL — ABNORMAL LOW (ref 3.87–5.11)
RDW: 14.3 % (ref 11.5–15.5)
WBC: 11.2 10*3/uL — ABNORMAL HIGH (ref 4.0–10.5)
nRBC: 0 % (ref 0.0–0.2)

## 2021-01-27 LAB — GLUCOSE, CAPILLARY
Glucose-Capillary: 224 mg/dL — ABNORMAL HIGH (ref 70–99)
Glucose-Capillary: 212 mg/dL — ABNORMAL HIGH (ref 70–99)
Glucose-Capillary: 149 mg/dL — ABNORMAL HIGH (ref 70–99)
Glucose-Capillary: 183 mg/dL — ABNORMAL HIGH (ref 70–99)

## 2021-01-27 LAB — MAGNESIUM: Magnesium: 1.6 mg/dL — ABNORMAL LOW (ref 1.7–2.4)

## 2021-01-27 MED ORDER — POTASSIUM CHLORIDE CRYS ER 20 MEQ PO TBCR
40.0000 meq | EXTENDED_RELEASE_TABLET | Freq: Once | ORAL | Status: AC
  Administered 2021-01-27: 40 meq via ORAL
  Filled 2021-01-27: qty 2

## 2021-01-27 MED ORDER — MAGNESIUM SULFATE 2 GM/50ML IV SOLN
2.0000 g | Freq: Once | INTRAVENOUS | Status: AC
  Administered 2021-01-27: 2 g via INTRAVENOUS
  Filled 2021-01-27: qty 50

## 2021-01-27 MED ORDER — AMLODIPINE BESYLATE 5 MG PO TABS
2.5000 mg | ORAL_TABLET | Freq: Every day | ORAL | Status: DC
  Administered 2021-01-28: 2.5 mg via ORAL
  Filled 2021-01-27: qty 1

## 2021-01-27 NOTE — Progress Notes (Addendum)
Progress Note  Patient Name: Briana [name] Date of Encounter: 01/27/2021  [facility] Cardiologist: Shelva Majestic, MD   Subjective   DOE has significantly improved with diuresis.  Denies any chest pain. Now up walking in the room with no O2  Inpatient Medications    Scheduled Meds: . amiodarone  200 mg Oral Daily  . apixaban  2.5 mg Oral BID  . feeding supplement  237 mL Oral BID BM  . furosemide  80 mg Intravenous Q12H  . insulin aspart  0-6 Units Subcutaneous TID WC  . multivitamin with minerals  1 tablet Oral Daily   Continuous Infusions:  PRN Meds: albuterol   Vital Signs    Vitals:   01/26/21 0557 01/26/21 2005 01/27/21 0506 01/27/21 0800  BP: (!) 114/56 (!) 143/64 (!) 130/55   Pulse: (!) 50 (!) 51 (!) 52 (!) 54  Resp: 20 20 19    Temp: 98.7 F (37.1 C) 97.7 F (36.5 C) 97.6 F (36.4 C)   TempSrc:  Oral Oral   SpO2: 94% 96% 92%   Weight: 74.2 kg     Height:        Intake/Output Summary (Last 24 hours) at 01/27/2021 1049 Last data filed at 01/27/2021 0805 Gross per 24 hour  Intake 360 ml  Output 2300 ml  Net -1940 ml   Last 3 Weights 01/26/2021 01/24/2021 01/23/2021  Weight (lbs) 163 lb 9.3 oz 165 lb 1.6 oz 158 lb 15.2 oz  Weight (kg) 74.2 kg 74.889 kg 72.1 kg      Telemetry     sinus bradycardia- Personally Reviewed  ECG    01/22/2021: Sinus rhythm.  Rate 60 bpm.  Right bundle branch block. - Personally Reviewed  Physical Exam   VS:  BP (!) 130/55 (BP Location: Left Arm)   Pulse (!) 54   Temp 97.6 F (36.4 C) (Oral)   Resp 19   Ht 5\' 1"  (1.549 m)   Wt 74.2 kg   SpO2 92%   BMI 30.91 kg/m  , BMI Body mass index is 30.91 kg/m. GEN: Well nourished, well developed in no acute distress HEENT: Normal NECK: No JVD; No carotid bruits LYMPHATICS: No lymphadenopathy CARDIAC:RRR, no murmurs, rubs, gallops RESPIRATORY:  Clear to auscultation without rales, wheezing or rhonchi  ABDOMEN: Soft, non-tender, non-distended MUSCULOSKELETAL:  No edema;  No deformity  SKIN: Warm and dry NEUROLOGIC:  Alert and oriented x 3 PSYCHIATRIC:  Normal affect    Labs    High Sensitivity Troponin:   Recent Labs  Lab 01/22/21 1745 01/22/21 2054  TROPONINIHS 28* 27*      Chemistry Recent Labs  Lab 01/22/21 1745 01/23/21 0401 01/25/21 0428 01/26/21 0422 01/27/21 0328  NA 137   < > 138 138 140  K 2.9*   < > 4.1 3.9 3.1*  CL 101   < > 103 100 99  CO2 26   < > 25 27 29   GLUCOSE 195*   < > 191* 203* 186*  BUN 51*   < > 50* 45* 40*  CREATININE 2.09*   < > 1.89* 1.86* 1.63*  CALCIUM 8.6*   < > 8.6* 8.7* 8.6*  PROT 5.6*  --   --   --   --   ALBUMIN 3.0*  --   --   --   --   AST 34  --   --   --   --   ALT 35  --   --   --   --  ALKPHOS 93  --   --   --   --   BILITOT 1.7*  --   --   --   --   GFRNONAA 23*   < > 26* 26* 31*  ANIONGAP 10   < > 10 11 12    < > = values in this interval not displayed.     Hematology Recent Labs  Lab 01/24/21 0349 01/25/21 0428 01/27/21 0328  WBC 14.8* 13.9* 11.2*  RBC 3.54* 3.59* 3.86*  HGB 10.8* 10.7* 11.6*  HCT 33.2* 33.8* 35.0*  MCV 93.8 94.2 90.7  MCH 30.5 29.8 30.1  MCHC 32.5 31.7 33.1  RDW 14.5 14.6 14.3  PLT 165 191 263    BNP Recent Labs  Lab 01/22/21 1745  BNP 464.4*     DDimer  Recent Labs  Lab 01/22/21 2054  DDIMER 2.44*     Radiology    No results found.  Cardiac Studies   Echo 01/24/21: 1. Left ventricular ejection fraction, by estimation, is 55%. The left  ventricle has normal function. The left ventricle has no regional wall  motion abnormalities. There is mild left ventricular hypertrophy. Left  ventricular diastolic parameters are  indeterminate.  2. Right ventricular systolic function is normal. The right ventricular  size is mildly enlarged. There is moderately elevated pulmonary artery  systolic pressure. The estimated right ventricular systolic pressure is  29.5 mmHg.  3. Left atrial size was moderately dilated.  4. Right atrial size was mildly  dilated.  5. The mitral valve is grossly normal. Mild to moderate mitral valve  regurgitation. No evidence of mitral stenosis.  6. The aortic valve is grossly normal. Aortic valve regurgitation is not  visualized. No aortic stenosis is present.  7. The inferior vena cava is dilated in size with <50% respiratory  variability, suggesting right atrial pressure of 15 mmHg.   Patient Profile     87 y.o. female with chronic diastolic heart failure, paroxysmal atrial fibrillation, hypertension, hyperlipidemia admitted with acute on chronic diastolic heart failure and acute renal failure as well as UTI.  Assessment & Plan    # Acute on chronic diastolic heart failure: # Hypertension: -She continues to diurese well.  She put out 1.2L yesterday and is net neg 1.99L after an increase in IV Lasix  -SCr continues to improve (1.99>>1.86>>1.63). -continue Lasix to 80 mg IV twice daily.   -Right atrial pressure was 15 mmHg on echo over the weekend.   -Her home olmesartan is on hold in the setting of acute renal failure and will continue to hold for now -Amlodipine has also be held due to low blood pressure but BP much improved -restart amlodipine at 2.5mg  daily and titrate as needed for BP control -reassess in am and may be able to change to PO lasix  #Paroxysmal atrial fibrillation: #Bradycardia:  -continue to maintain sinus bradycardia with HR in th 50'sPatient currently in sinus rhythm but bradycardic initially to the 40-50s.   -she presented with an episode of syncope>>she stood up and then found herself on the ground.   -Although she does have a UTI and acute on chronic diastolic heart failure, suspect that her bradycardia was contributing.  -She is on amiodarone for suppression of atrial fibrillation.    -Continue to hold nadolol >> The half life of nadalol is quite long (20-24 hours).   -Therefore we need about 3 days for it to be out of her system.  Last dose was 3/11. -continue apixaban  at  reduced dose of 2.5mg  BID for age > 80 and SCr > 1.5.     I have spent a total of 35 minutes with patient reviewing 2D echo , telemetry, EKGs, labs and examining patient as well as establishing an assessment and plan that was discussed with the patient.  > 50% of time was spent in direct patient care.    For questions or updates, please contact [place] Please consult [email or web address] for contact info under        Signed, Fransico Him, MD  01/27/2021, 10:49 AM

## 2021-01-27 NOTE — Progress Notes (Signed)
Physical Therapy Treatment Patient Details Name: Briana [name] MRN: [id number] DOB: 1934-01-15 Today's Date: 01/27/2021    History of Present Illness Pt is 87 year old female admitted on 01/22/21 with weakness and falls over the past 2 weeks.  Admitted for acute on chronic kidney disease, transient hypoxia with associated generalized weakness and falls.Pt also found to have sinus brady and being seen by cardiology.  Pt lives alone and has been ambulating with a walker x2 weeks, not on home oxygen, medical history significant for HTN, persistent atrial fibrillation, chronic diastolic CHF, GERD, hyperlipidemia.    PT Comments    Pt making excellent progress.  She is very motivated.  Daughter present and reports that her sisters plan to provide 24 hr support for a few days at discharge.  Pt ambulated 200' with RW and supervision today.  She was also able to navigate around obstacles in room.  Continue plan of care.    Follow Up Recommendations  Home health PT;Supervision/Assistance - 24 hour     Equipment Recommendations  None recommended by PT    Recommendations for Other Services       Precautions / Restrictions Precautions Precautions: Fall Precaution Comments: hx of falling (secondary to bradycardia?)    Mobility  Bed Mobility               General bed mobility comments: in chair at arrival    Transfers Overall transfer level: Needs assistance Equipment used: Rolling walker (2 wheeled) Transfers: Sit to/from Stand Sit to Stand: Supervision         General transfer comment: performed x 2 safely  Ambulation/Gait Ambulation/Gait assistance: Min guard Gait Distance (Feet): 200 Feet   Gait Pattern/deviations: Step-through pattern;Decreased stride length     General Gait Details: 200' in hall way and then 25' in room navigating around obstacles; min cues for RW proximity   Stairs             Wheelchair Mobility    Modified Rankin (Stroke Patients  Only)       Balance Overall balance assessment: Needs assistance Sitting-balance support: No upper extremity supported;Feet supported Sitting balance-Leahy Scale: Good     Standing balance support: During functional activity;Bilateral upper extremity supported;No upper extremity supported Standing balance-Leahy Scale: Good Standing balance comment: Pt able to ambulate with RW, brushed teeth and washed face at sink with reaching without support                            Cognition Arousal/Alertness: Awake/alert Behavior During Therapy: WFL for tasks assessed/performed Overall Cognitive Status: Within Functional Limits for tasks assessed                                        Exercises      General Comments General comments (skin integrity, edema, etc.): On RA with sats 96%.  HR stayed in 50's trhgouhout treatment.  Daughter Jacqlyn Larsen present      Pertinent Vitals/Pain Pain Assessment: No/denies pain    Home Living                      Prior Function            PT Goals (current goals can now be found in the care plan section) Acute Rehab PT Goals Patient Stated Goal: to get stronger PT Goal Formulation: With  patient/family Time For Goal Achievement: 02/07/21 Potential to Achieve Goals: Good Progress towards PT goals: Progressing toward goals    Frequency    Min 3X/week      PT Plan Current plan remains appropriate    Co-evaluation              AM-PAC PT "6 Clicks" Mobility   Outcome Measure  Help needed turning from your back to your side while in a flat bed without using bedrails?: None Help needed moving from lying on your back to sitting on the side of a flat bed without using bedrails?: None Help needed moving to and from a bed to a chair (including a wheelchair)?: A Little Help needed standing up from a chair using your arms (e.g., wheelchair or bedside chair)?: A Little Help needed to walk in hospital room?: A  Little Help needed climbing 3-5 steps with a railing? : A Little 6 Click Score: 20    End of Session Equipment Utilized During Treatment: Gait belt Activity Tolerance: Patient tolerated treatment well Patient left: with call bell/phone within reach;with chair alarm set;in chair;with family/visitor present Nurse Communication: Mobility status PT Visit Diagnosis: Unsteadiness on feet (R26.81);History of falling (Z91.81);Difficulty in walking, not elsewhere classified (R26.2)     Time: 1031-1101 PT Time Calculation (min) (ACUTE ONLY): 30 min  Charges:  $Gait Training: 8-22 mins $Therapeutic Activity: 8-22 mins                     Abran Richard, PT Acute Rehab Services Pager [phone] [facility] [place] 01/27/2021, 11:26 AM

## 2021-01-27 NOTE — Care Management Important Message (Signed)
Important Message  Patient Details IM Letter given to the Patient. Name: MILAGROS MIDDENDORF MRN: [id number] Date of Birth: [birth year]   Medicare Important Message Given:  Yes     Kerin Salen 01/27/2021, 11:47 AM

## 2021-01-27 NOTE — Progress Notes (Addendum)
PROGRESS NOTE   Briana [name]  YDX:[id number]    DOB: 1934/07/20    DOA: 01/22/2021  PCP: Unk Pinto, MD   I have briefly reviewed patients previous medical records in [facility].  Chief Complaint  Patient presents with  . Fall    Brief Narrative:  87 year old female, lives alone, ambulates with a walker x2 weeks, not on home oxygen, medical history significant for HTN, persistent atrial fibrillation, chronic diastolic CHF, GERD, hyperlipidemia presented to the ED due to weakness and falls.  Over the last 2 weeks, due to worsening leg edema/tightness and dyspnea, as per her physician instruction she took a couple days of extra doses of Bumex.  Admitted for acute on chronic kidney disease, transient hypoxia with associated generalized weakness and falls.  Treated with IV antibiotics for about 24 hours with minimal improvement in creatinine but developed worsening dyspnea due to CHF.  IV fluids discontinued.  Cardiology and nephrology consulted.  Diuresing well and improving on IV Lasix.   Assessment & Plan:  Principal Problem:   AKI (acute kidney injury) ([place]) Active Problems:   Essential hypertension   Unspecified atrial fibrillation (HCC)   Chronic diastolic heart failure (HCC)   Acquired thrombophilia ([place])   Acute kidney injury complicating CKD stage IIIa:  Baseline creatinine 0.9 in October 2021.  Presented with creatinine of 2.09.  Acute kidney injury suspected due to recent increased dose of Bumex, ARB and some GI losses.  ARB held.   Renal ultrasound: No hydronephrosis.  She was briefly hydrated with IV fluids for about 24 hours, creatinine marginally improved but she developed dyspnea and decompensated CHF.  IV fluids were discontinued.  Nephrology consulted.  AKI likely secondary to hemodynamics and ARB.  Started IV Lasix with gradual improvement in creatinine.    Creatinine had peaked to 1.99.  Down to 1.63 today  Having excellent urine output, not all  being documented.  At least 1400 mL charted with multiple unmeasured urine output.  Dyspnea seems to have resolved.  Cardiology plans IV Lasix for additional day and then transition to oral Lasix.  Hypokalemia/hypomagnesemia:  Replace and follow  Pansensitive E. coli acute cystitis  Urine culture shows >100 K colonies of E. coli  Completed course with 3 days of IV ceftriaxone  Did have some dysuria PTA she says but seems to have resolved.  Also reported chills PTA.  Leukocytosis  May be reactive.?  Recent outpatient steroid use.  Improved  Generalized weakness/debility and falls  Secondary to dehydration, acute kidney injury complicating advanced age and frailty.  No syncope.  Therapies evaluation recommend home health PT and OT at discharge.  Acute on chronic diastolic CHF:  2D echo 05/17/946: LVEF 60-65%.  Repeat 2D echo 3/11: LVEF 55% without regional wall motion abnormalities.  Follows with Dr. Claiborne Billings, Cardiology.  As noted above, diuretics held on admission due to AKI and dehydration, briefly hydrated with IV fluids which precipitated acute CHF  Cardiology following, have increased IV Lasix to 80 mg every 12 hours due to ongoing exertional dyspnea.  Not much peripheral edema.  Continues to improve.  -1.9 L thus far but not sure if this is accurate.  Cardiology plans additional day of IV Lasix prior to transitioning to oral.  Persistent atrial fibrillation  Continue amiodarone and reduced dose of apixaban due to AKI.  Currently in sinus rhythm  Nadolol discontinued since 3/11 due to sinus bradycardia and need for better blood pressures.  Cardiology following and indicated that nadolol effect  may take a couple days to washout.  Bradycardia marginally improved and now mostly in the 50s  Syncope  Sinus bradycardia on telemetry.  Echo results as above.  Patient and daughter counseled that she should not drive for 6 months  Transient hypoxia/acute  hypoxic respiratory failure  D-dimer up to 2.44.  No evidence of pulmonary embolism on perfusion only scan.  Incentive spirometry and wean oxygen as tolerated.  Essential hypertension:  Were holding ARB due to AKI, amlodipine and nadolol  Cardiology has resumed low-dose amlodipine with plans to uptitrate.  Prolonged QTC  Difficult to accurately interpret in the context of BBB morphology.  Replaced hypokalemia.  Magnesium 2.2.  Avoid QT prolonging medications.  Already on amiodarone.  Type II DM, newly diagnosed with hyperglycemia  Follow CBGs and SSI if needed.  A1c 6.8.  Diet controlled at DC with outpatient follow-up with PCP.  Multiple bilateral benign exophytic renal cysts  Noted on renal ultrasound and as per radiologist, no follow-up or characterization required.  Anemia  Possibly chronic disease.  Stable.  Body mass index is 30.91 kg/m.  Nutritional Status Nutrition Problem: Inadequate oral intake Etiology: poor appetite Signs/Symptoms: per patient/family report Interventions: Ensure Enlive (each supplement provides 350kcal and 20 grams of protein),MVI    DVT prophylaxis: apixaban (ELIQUIS) tablet 2.5 mg Start: 01/23/21 2200  On apixaban   Code Status: Full Code Family Communication: I discussed with patient's daughter via phone on 3/14, updated care and answered questions.  Advised her that patient may possibly DC home tomorrow.  The family will make efforts to try and provide as much 24/7 supervision and care as much as possible. Disposition:  Status is: Inpatient  Remains inpatient appropriate because:Inpatient level of care appropriate due to severity of illness   Dispo: The patient is from: Home              Anticipated d/c is to: Home with home health services, possibly 3/15.              Patient currently is not medically stable to d/c.   Difficult to place patient No        Consultants:   Nephrology Cardiology  Procedures:    None  Antimicrobials:    Anti-infectives (From admission, onward)   Start     Dose/Rate Route Frequency Ordered Stop   01/23/21 0645  cefTRIAXone (ROCEPHIN) 1 g in sodium chloride 0.9 % 100 mL IVPB  Status:  Discontinued        1 g 200 mL/hr over 30 Minutes Intravenous Every 24 hours 01/23/21 0546 01/25/21 1700        Subjective:  States that overall she feels much better.  Denies dyspnea and indicates that her breathing may be almost back to her baseline.  Objective:   Vitals:   01/26/21 2005 01/27/21 0506 01/27/21 0800 01/27/21 1325  BP: (!) 143/64 (!) 130/55  (!) 111/55  Pulse: (!) 51 (!) 52 (!) 54 (!) 50  Resp: 20 19  16   Temp: 97.7 F (36.5 C) 97.6 F (36.4 C)  98 F (36.7 C)  TempSrc: Oral Oral  Oral  SpO2: 96% 92%  98%  Weight:      Height:        General exam: Pleasant elderly female, moderately built and thinly nourished, lying comfortably propped up in bed without distress.  Does not appear to be getting dyspneic on speaking now. Respiratory system: Clear to auscultation.  No increased work of breathing. Cardiovascular system: S1  and S2 heard, RRR.  No JVD or murmurs.  No pedal edema.  Telemetry personally reviewed: Now SB mostly in the low 50s.  Very occasionally in the high 40s/48 etc. Gastrointestinal system: Abdomen is nondistended, soft and nontender. No organomegaly or masses felt. Normal bowel sounds heard. Central nervous system: Alert and oriented. No focal neurological deficits. Extremities: Symmetric 5 x 5 power. Skin: No rashes, lesions or ulcers Psychiatry: Judgement and insight appear somewhat impaired. Mood & affect appropriate.     Data Reviewed:   I have personally reviewed following labs and imaging studies   CBC: Recent Labs  Lab 01/22/21 1745 01/23/21 0401 01/24/21 0349 01/25/21 0428 01/27/21 0328  WBC 15.6*   < > 14.8* 13.9* 11.2*  NEUTROABS 13.6*  --   --   --   --   HGB 11.8*   < > 10.8* 10.7* 11.6*  HCT 36.0   < > 33.2*  33.8* 35.0*  MCV 91.4   < > 93.8 94.2 90.7  PLT 187   < > 165 191 263   < > = values in this interval not displayed.    Basic Metabolic Panel: Recent Labs  Lab 01/22/21 2054 01/23/21 0401 01/25/21 0428 01/26/21 0422 01/27/21 0328  NA  --    < > 138 138 140  K  --    < > 4.1 3.9 3.1*  CL  --    < > 103 100 99  CO2  --    < > 25 27 29   GLUCOSE  --    < > 191* 203* 186*  BUN  --    < > 50* 45* 40*  CREATININE  --    < > 1.89* 1.86* 1.63*  CALCIUM  --    < > 8.6* 8.7* 8.6*  MG 2.2  --   --   --  1.6*   < > = values in this interval not displayed.    Liver Function Tests: Recent Labs  Lab 01/22/21 1745  AST 34  ALT 35  ALKPHOS 93  BILITOT 1.7*  PROT 5.6*  ALBUMIN 3.0*    CBG: Recent Labs  Lab 01/26/21 2008 01/27/21 0801 01/27/21 1130  GLUCAP 176* 224* 212*    Microbiology Studies:   Recent Results (from the past 240 hour(s))  Resp Panel by RT-PCR (Flu A&B, Covid) Nasopharyngeal Swab     Status: None   Collection Time: 01/22/21  8:04 PM   Specimen: Nasopharyngeal Swab; Nasopharyngeal(NP) swabs in vial transport medium  Result Value Ref Range Status   SARS Coronavirus 2 by RT PCR NEGATIVE NEGATIVE Final    Comment: (NOTE) SARS-CoV-2 target nucleic acids are NOT DETECTED.  The SARS-CoV-2 RNA is generally detectable in upper respiratory specimens during the acute phase of infection. The lowest concentration of SARS-CoV-2 viral copies this assay can detect is 138 copies/mL. A negative result does not preclude SARS-Cov-2 infection and should not be used as the sole basis for treatment or other patient management decisions. A negative result may occur with  improper specimen collection/handling, submission of specimen other than nasopharyngeal swab, presence of viral mutation(s) within the areas targeted by this assay, and inadequate number of viral copies(<138 copies/mL). A negative result must be combined with clinical observations, patient history, and  epidemiological information. The expected result is Negative.  Fact Sheet for Patients:  EntrepreneurPulse.com.au  Fact Sheet for Healthcare Providers:  IncredibleEmployment.be  This test is no t yet approved or cleared by the [place] FDA  and  has been authorized for detection and/or diagnosis of SARS-CoV-2 by FDA under an Emergency Use Authorization (EUA). This EUA will remain  in effect (meaning this test can be used) for the duration of the COVID-19 declaration under Section 564(b)(1) of the Act, 21 U.S.C.section 360bbb-3(b)(1), unless the authorization is terminated  or revoked sooner.       Influenza A by PCR NEGATIVE NEGATIVE Final   Influenza B by PCR NEGATIVE NEGATIVE Final    Comment: (NOTE) The Xpert Xpress SARS-CoV-2/FLU/RSV plus assay is intended as an aid in the diagnosis of influenza from Nasopharyngeal swab specimens and should not be used as a sole basis for treatment. Nasal washings and aspirates are unacceptable for Xpert Xpress SARS-CoV-2/FLU/RSV testing.  Fact Sheet for Patients: EntrepreneurPulse.com.au  Fact Sheet for Healthcare Providers: IncredibleEmployment.be  This test is not yet approved or cleared by the [place] FDA and has been authorized for detection and/or diagnosis of SARS-CoV-2 by FDA under an Emergency Use Authorization (EUA). This EUA will remain in effect (meaning this test can be used) for the duration of the COVID-19 declaration under Section 564(b)(1) of the Act, 21 U.S.C. section 360bbb-3(b)(1), unless the authorization is terminated or revoked.  Performed at [facility], [place] [address], [place], [place] [address]   Culture, Urine     Status: Abnormal   Collection Time: 01/22/21 10:30 PM   Specimen: Urine, Clean Catch  Result Value Ref Range Status   Specimen Description   Final    URINE, CLEAN CATCH Performed at [facility], [place] [address], [place], [place] [address]    Special Requests   Final    NONE Performed at [facility], [place] [address] [place], [place] [address]    Culture >=100,000 COLONIES/mL ESCHERICHIA COLI (A)  Final   Report Status 01/25/2021 FINAL  Final   Organism ID, Bacteria ESCHERICHIA COLI (A)  Final      Susceptibility   Escherichia coli - MIC*    AMPICILLIN 4 SENSITIVE Sensitive     CEFAZOLIN <=4 SENSITIVE Sensitive     CEFEPIME <=0.12 SENSITIVE Sensitive     CEFTRIAXONE <=0.25 SENSITIVE Sensitive     CIPROFLOXACIN <=0.25 SENSITIVE Sensitive     GENTAMICIN <=1 SENSITIVE Sensitive     IMIPENEM <=0.25 SENSITIVE Sensitive     NITROFURANTOIN <=16 SENSITIVE Sensitive     TRIMETH/SULFA <=20 SENSITIVE Sensitive     AMPICILLIN/SULBACTAM <=2 SENSITIVE Sensitive     PIP/TAZO <=4 SENSITIVE Sensitive     * >=100,000 COLONIES/mL ESCHERICHIA COLI     Radiology Studies:  No results found.   Scheduled Meds:   . amiodarone  200 mg Oral Daily  . amLODipine  2.5 mg Oral Daily  . apixaban  2.5 mg Oral BID  . feeding supplement  237 mL Oral BID BM  . furosemide  80 mg Intravenous Q12H  . insulin aspart  0-6 Units Subcutaneous TID WC  . multivitamin with minerals  1 tablet Oral Daily    Continuous Infusions:      LOS: 4 days     Vernell Leep, MD, [place], [facility]. [facility]    To contact the attending provider between 7A-7P or the covering provider during after hours 7P-7A, please log into the web site [email or web address] and access using universal [place] password for that web site. If you do not have the password, please call the hospital operator.  01/27/2021, 1:57 PM

## 2021-01-28 ENCOUNTER — Telehealth: Payer: Self-pay | Admitting: Physician Assistant

## 2021-01-28 LAB — BASIC METABOLIC PANEL
Anion gap: 13 (ref 5–15)
BUN: 37 mg/dL — ABNORMAL HIGH (ref 8–23)
CO2: 30 mmol/L (ref 22–32)
Calcium: 8.7 mg/dL — ABNORMAL LOW (ref 8.9–10.3)
Chloride: 95 mmol/L — ABNORMAL LOW (ref 98–111)
Creatinine, Ser: 1.59 mg/dL — ABNORMAL HIGH (ref 0.44–1.00)
GFR, Estimated: 32 mL/min — ABNORMAL LOW (ref >60)
Glucose, Bld: 211 mg/dL — ABNORMAL HIGH (ref 70–99)
Potassium: 3.2 mmol/L — ABNORMAL LOW (ref 3.5–5.1)
Sodium: 138 mmol/L (ref 135–145)

## 2021-01-28 LAB — MAGNESIUM: Magnesium: 2.1 mg/dL (ref 1.7–2.4)

## 2021-01-28 LAB — GLUCOSE, CAPILLARY
Glucose-Capillary: 216 mg/dL — ABNORMAL HIGH (ref 70–99)
Glucose-Capillary: 224 mg/dL — ABNORMAL HIGH (ref 70–99)

## 2021-01-28 MED ORDER — AMLODIPINE BESYLATE 2.5 MG PO TABS: 2.5000 mg | ORAL_TABLET | Freq: Every day | ORAL | 0 refills | Status: DC

## 2021-01-28 MED ORDER — POTASSIUM CHLORIDE CRYS ER 20 MEQ PO TBCR
40.0000 meq | EXTENDED_RELEASE_TABLET | ORAL | Status: AC
  Administered 2021-01-28 (×2): 40 meq via ORAL
  Filled 2021-01-28 (×2): qty 2

## 2021-01-28 MED ORDER — FUROSEMIDE 40 MG PO TABS
80.0000 mg | ORAL_TABLET | Freq: Two times a day (BID) | ORAL | Status: DC
Start: 2021-01-28 — End: 2021-01-28

## 2021-01-28 MED ORDER — POTASSIUM CHLORIDE CRYS ER 20 MEQ PO TBCR: 20.0000 meq | EXTENDED_RELEASE_TABLET | Freq: Two times a day (BID) | ORAL | 0 refills | Status: DC

## 2021-01-28 MED ORDER — FUROSEMIDE 80 MG PO TABS: 80.0000 mg | ORAL_TABLET | Freq: Two times a day (BID) | ORAL | 0 refills | Status: DC

## 2021-01-28 MED ORDER — APIXABAN 2.5 MG PO TABS: 2.5000 mg | ORAL_TABLET | Freq: Two times a day (BID) | ORAL | 1 refills | Status: DC

## 2021-01-28 MED ORDER — PROAIR HFA 108 (90 BASE) MCG/ACT IN AERS: 2.0000 | INHALATION_SPRAY | Freq: Four times a day (QID) | RESPIRATORY_TRACT | Status: DC | PRN

## 2021-01-28 NOTE — TOC Transition Note (Signed)
Transition of Care [facility]) - CM/SW Discharge Note   Patient Details  Name: Briana [name] MRN: [id number] Date of Birth: [birth year]  [facility] CM/SW Contact:  Ross Ludwig, LCSW Phone Number: 01/28/2021, 2:08 PM   Clinical Narrative:     Patient will be going home with home health through [facility].  CSW signing off please reconsult with any other social work needs, home health agency has been notified of planned discharge.  Patient's daughter Charleen Kirks, [phone] is aware of patient discharging today.  Patient needs a 3 in 1, CSW contacted [facility] they will deliver 3 in 1 to the room before she leaves.     Final next level of care: [place] Barriers to Discharge: Barriers Resolved   Patient Goals and CMS Choice Patient states their goals for this hospitalization and ongoing recovery are:: To return back home with home health. CMS Medicare.gov Compare Post Acute Care list provided to:: Patient Represenative (must comment) Choice offered to / list presented to : Adult Children  Discharge Placement                       Discharge Plan and Services                DME Arranged: 3-N-1 DME Agency: [facility] Date DME Agency Contacted: 01/28/21 Time DME Agency Contacted: [phone] Representative spoke with at DME Agency: [place]: PT,OT [place]: [place] Date [place]: 01/28/21 Time [place]: 1407 Representative spoke with at [place]: [place] Determinants of Health ([place]) Interventions     Readmission Risk Interventions No flowsheet data found.

## 2021-01-28 NOTE — Plan of Care (Signed)
  Problem: Education: Goal: Knowledge of General Education information will improve Description: Including pain rating scale, medication(s)/side effects and non-pharmacologic comfort measures Outcome: Adequate for Discharge   Problem: Health Behavior/Discharge Planning: Goal: Ability to manage health-related needs will improve Outcome: Adequate for Discharge   Problem: Clinical Measurements: Goal: Ability to maintain clinical measurements within normal limits will improve Outcome: Adequate for Discharge Goal: Will remain free from infection Outcome: Adequate for Discharge Goal: Diagnostic test results will improve Outcome: Adequate for Discharge Goal: Respiratory complications will improve Outcome: Adequate for Discharge Goal: Cardiovascular complication will be avoided Outcome: Adequate for Discharge   Problem: Activity: Goal: Risk for activity intolerance will decrease Outcome: Adequate for Discharge   Problem: Nutrition: Goal: Adequate nutrition will be maintained Outcome: Adequate for Discharge   Problem: Coping: Goal: Level of anxiety will decrease Outcome: Adequate for Discharge   

## 2021-01-28 NOTE — Progress Notes (Signed)
Progress Note  Patient Name: Briana [name] Date of Encounter: 01/28/2021  [facility] Cardiologist: Shelva Majestic, MD   Subjective   Breathing almost back to normal.  Chest pain or palpitation.  Inpatient Medications    Scheduled Meds: . amiodarone  200 mg Oral Daily  . amLODipine  2.5 mg Oral Daily  . apixaban  2.5 mg Oral BID  . feeding supplement  237 mL Oral BID BM  . furosemide  80 mg Intravenous Q12H  . insulin aspart  0-6 Units Subcutaneous TID WC  . multivitamin with minerals  1 tablet Oral Daily  . potassium chloride  40 mEq Oral Q4H   Continuous Infusions:  PRN Meds: albuterol   Vital Signs    Vitals:   01/27/21 1325 01/27/21 2133 01/28/21 0352 01/28/21 0500  BP: (!) 111/55 139/66 131/69   Pulse: (!) 50 (!) 52 (!) 51   Resp: 16 20 18    Temp: 98 F (36.7 C) 98.1 F (36.7 C) 97.9 F (36.6 C)   TempSrc: Oral Oral Oral   SpO2: 98% 92% 91%   Weight:    68.2 kg  Height:        Intake/Output Summary (Last 24 hours) at 01/28/2021 1004 Last data filed at 01/28/2021 0600 Gross per 24 hour  Intake 240 ml  Output 1650 ml  Net -1410 ml   Last 3 Weights 01/28/2021 01/26/2021 01/24/2021  Weight (lbs) 150 lb 5.7 oz 163 lb 9.3 oz 165 lb 1.6 oz  Weight (kg) 68.2 kg 74.2 kg 74.889 kg      Telemetry    Sinus bradycardia in 50s- Personally Reviewed  ECG    n/a  Physical Exam   GEN: No acute distress.   Neck: No JVD Cardiac: RRR, no murmurs, rubs, or gallops.  Respiratory: Clear to auscultation bilaterally. GI: Soft, nontender, non-distended  MS: No edema; No deformity. Neuro:  Nonfocal  Psych: Normal affect   Labs    High Sensitivity Troponin:   Recent Labs  Lab 01/22/21 1745 01/22/21 2054  TROPONINIHS 28* 27*      Chemistry Recent Labs  Lab 01/22/21 1745 01/23/21 0401 01/26/21 0422 01/27/21 0328 01/28/21 0337  NA 137   < > 138 140 138  K 2.9*   < > 3.9 3.1* 3.2*  CL 101   < > 100 99 95*  CO2 26   < > 27 29 30   GLUCOSE 195*   < >  203* 186* 211*  BUN 51*   < > 45* 40* 37*  CREATININE 2.09*   < > 1.86* 1.63* 1.59*  CALCIUM 8.6*   < > 8.7* 8.6* 8.7*  PROT 5.6*  --   --   --   --   ALBUMIN 3.0*  --   --   --   --   AST 34  --   --   --   --   ALT 35  --   --   --   --   ALKPHOS 93  --   --   --   --   BILITOT 1.7*  --   --   --   --   GFRNONAA 23*   < > 26* 31* 32*  ANIONGAP 10   < > 11 12 13    < > = values in this interval not displayed.     Hematology Recent Labs  Lab 01/24/21 0349 01/25/21 0428 01/27/21 0328  WBC 14.8* 13.9* 11.2*  RBC 3.54* 3.59*  3.86*  HGB 10.8* 10.7* 11.6*  HCT 33.2* 33.8* 35.0*  MCV 93.8 94.2 90.7  MCH 30.5 29.8 30.1  MCHC 32.5 31.7 33.1  RDW 14.5 14.6 14.3  PLT 165 191 263    BNP Recent Labs  Lab 01/22/21 1745  BNP 464.4*     DDimer  Recent Labs  Lab 01/22/21 2054  DDIMER 2.44*     Radiology    No results found.  Cardiac Studies   Echo 01/24/21: 1. Left ventricular ejection fraction, by estimation, is 55%. The left  ventricle has normal function. The left ventricle has no regional wall  motion abnormalities. There is mild left ventricular hypertrophy. Left  ventricular diastolic parameters are  indeterminate.  2. Right ventricular systolic function is normal. The right ventricular  size is mildly enlarged. There is moderately elevated pulmonary artery  systolic pressure. The estimated right ventricular systolic pressure is  41.9 mmHg.  3. Left atrial size was moderately dilated.  4. Right atrial size was mildly dilated.  5. The mitral valve is grossly normal. Mild to moderate mitral valve  regurgitation. No evidence of mitral stenosis.  6. The aortic valve is grossly normal. Aortic valve regurgitation is not  visualized. No aortic stenosis is present.  7. The inferior vena cava is dilated in size with <50% respiratory  variability, suggesting right atrial pressure of 15 mmHg.   Patient Profile     87 y.o. female with chronic diastolic heart  failure, paroxysmal atrial fibrillation, hypertension, hyperlipidemia admitted with acute on chronic diastolic heart failure and acute renal failure as well as UTI.  Assessment & Plan    1.  Acute on chronic diastolic heart failure -Echocardiogram with LV function of 55%, intermediate diastolic parameter, RSVP 37.9 mmHg -Diurese 2.3 L in past 24 hours and net INO 3.1 L - Weight down 163>>150lb -Likely change Lasix to p.o. per MD  2.  Acute on chronic CKD III -Baseline creatinine around 1 -Creatinine 2.09 on admit>> improved with diuresis>>1.59 today -Benicar 40 mg on hold  3.  Hypertension -Benicar hold due to AKI -Nadolol hold due to bradycardia -Blood pressure stable on amlodipine 2.5 mg  4.  Paroxysmal atrial fibrillation 5. Sinus bradycardia 6. Syncope -Maintaining sinus rhythm with heart rate in 50s -She presented with episode of syncope, neighbor found her on the floor -Nadolol held -On amiodarone 200 mg daily -Eliquis reduced to 2.5 mg twice daily -No bleeding issue  7.  Hypokalemia -On supplement  Dr. Radford Pax to recommended discharge meds.      For questions or updates, please contact [place] Please consult [email or web address] for contact info under        SignedLeanor Kail, PA  01/28/2021, 10:04 AM

## 2021-01-28 NOTE — Discharge Instructions (Signed)

## 2021-01-28 NOTE — Progress Notes (Signed)
Occupational Therapy Treatment Patient Details Name: Briana [name] MRN: [id number] DOB: Apr 05, 1934 Today's Date: 01/28/2021    History of present illness Pt is 87 year old female admitted on 01/22/21 with weakness and falls over the past 2 weeks.  Admitted for acute on chronic kidney disease, transient hypoxia with associated generalized weakness and falls.Pt also found to have sinus brady and being seen by cardiology.  Pt lives alone and has been ambulating with a walker x2 weeks, not on home oxygen, medical history significant for HTN, persistent atrial fibrillation, chronic diastolic CHF, GERD, hyperlipidemia.   OT comments  Patient seated EOB upon entry and in agreement with OT treatment session with focus on self-care re-education including use of DME/AD. Patient ambulates 140ft in hallway with use of RW and supervision A for safety. OT also provided education on use of BSC in shower to increase safety and decrease fall risk. Patient expressed verbal understanding. Patient would benefit from continued acute OT services in prep for safe d/c home with [name] and initial 24hr supervision/assist from family.    Follow Up Recommendations  Home health OT;Supervision/Assistance - 24 hour    Equipment Recommendations  3 in 1 bedside commode    Recommendations for Other Services      Precautions / Restrictions Precautions Precautions: Fall Precaution Comments: hx of falling (secondary to bradycardia?) Restrictions Weight Bearing Restrictions: No       Mobility Bed Mobility               General bed mobility comments: Patient seated EOB upon entry.    Transfers Overall transfer level: Needs assistance Equipment used: Rolling walker (2 wheeled) Transfers: Sit to/from Stand Sit to Stand: Supervision         General transfer comment: Good recall of hand placement. Supervision A for safety.    Balance Overall balance assessment: Needs assistance Sitting-balance support: No  upper extremity supported;Feet supported Sitting balance-Leahy Scale: Good Sitting balance - Comments: Maintains static sitting balance at EOB with I.   Standing balance support: During functional activity;Bilateral upper extremity supported;No upper extremity supported Standing balance-Leahy Scale: Good                             ADL either performed or assessed with clinical judgement   ADL Overall ADL's : Needs assistance/impaired                                       General ADL Comments: Patient limited by generalized weakness and decreased activity tolerance.     Vision       Perception     Praxis      Cognition Arousal/Alertness: Awake/alert Behavior During Therapy: WFL for tasks assessed/performed Overall Cognitive Status: Within Functional Limits for tasks assessed                                          Exercises     Shoulder Instructions       General Comments SpO2 94-95% at rest on RA and 93-94% with activity on RA. HR in 50's throughout session.    Pertinent Vitals/ Pain       Pain Assessment: No/denies pain  Home Living  Prior Functioning/Environment              Frequency  Min 2X/week        Progress Toward Goals  OT Goals(current goals can now be found in the care plan section)  Progress towards OT goals: Progressing toward goals  Acute Rehab OT Goals Patient Stated Goal: To return home. OT Goal Formulation: With patient Time For Goal Achievement: 02/08/21 Potential to Achieve Goals: Good ADL Goals Pt Will Transfer to Toilet: with modified independence;ambulating;regular height toilet;grab bars Pt Will Perform Toileting - Clothing Manipulation and hygiene: with modified independence;sit to/from stand Pt/caregiver will Perform Home Exercise Program: Increased strength;Right Upper extremity;Left upper extremity Additional ADL  Goal #1: Patient will perform 10 min functional activity or exercise activity as evidence of improving activity tolerance  Plan Discharge plan remains appropriate;Frequency remains appropriate    Co-evaluation                 AM-PAC OT "6 Clicks" Daily Activity     Outcome Measure   Help from another person eating meals?: None Help from another person taking care of personal grooming?: A Little Help from another person toileting, which includes using toliet, bedpan, or urinal?: A Little Help from another person bathing (including washing, rinsing, drying)?: A Little Help from another person to put on and taking off regular upper body clothing?: A Little Help from another person to put on and taking off regular lower body clothing?: A Little 6 Click Score: 19    End of Session Equipment Utilized During Treatment: Gait belt;Rolling walker  OT Visit Diagnosis: Unsteadiness on feet (R26.81);Muscle weakness (generalized) (M62.81)   Activity Tolerance Patient tolerated treatment well   Patient Left in chair;with call bell/phone within reach;with chair alarm set   Nurse Communication          Time: 1436-1450 OT Time Calculation (min): 14 min  Charges: OT General Charges $OT Visit: 1 Visit OT Treatments $Therapeutic Activity: 8-22 mins  Zaviyar Rahal H. OTR/L Supplemental OT, Department of rehab services [phone]   Ismael Karge R H. 01/28/2021, 3:00 PM

## 2021-01-28 NOTE — Discharge Summary (Addendum)
Physician Discharge Summary  Briana [name]:[id number] DOB: 11/23/1933  PCP: Unk Pinto, MD  Admitted from: Home Discharged to: Home  Admit date: 01/22/2021 Discharge date: 01/28/2021  Recommendations for Outpatient Follow-up:    Follow-up Information    Unk Pinto, MD. Schedule an appointment as soon as possible for a visit in 1 week(s).   Specialty: Internal Medicine Contact information: [address] [place] [id number] [phone]        Almyra Deforest, [place]. Go on 01/31/2021.   Specialties: Cardiology, Radiology Why: @2 :15pm for hospital follow up with Dr. Evette Georges PA.  Please arrive 15 minutes early  Contact information: [address] [place] [address] [place] (From admission, onward)    Start     Ordered   01/28/21 [place]  At discharge       Question:  To provide the following care/treatments  Answer:  PT, OT   01/28/21 1120           Equipment/Devices:     Durable Medical Equipment  (From admission, onward)         Start     Ordered   01/28/21 1140  For home use only DME 3 n 1  Once        01/28/21 1139           Discharge Condition: Improved and stable   Code Status: Full Code Diet recommendation:  Discharge Diet Orders (From admission, onward)    Start     Ordered   01/28/21 0000  Diet - low sodium heart healthy        01/28/21 1120   01/28/21 0000  Diet Carb Modified        01/28/21 1120           Discharge Diagnoses:  Principal Problem:   AKI (acute kidney injury) ([place]) Active Problems:   Essential hypertension   Unspecified atrial fibrillation (HCC)   Chronic diastolic heart failure (HCC)   Acquired thrombophilia ([place])   Brief Summary: 87 year old female, lives alone, ambulates with a walker x2 weeks, not on home oxygen, medical history significant for HTN, persistent atrial fibrillation on Eliquis, chronic  diastolic CHF, GERD, hyperlipidemia presented to the ED due to weakness and falls.  Over the last 2 weeks, due to worsening leg edema/tightness and dyspnea, as per her physician instruction she took a couple days of extra doses of Bumex.  Admitted for acute on chronic kidney disease, transient hypoxia with associated generalized weakness and falls.  Treated with IV fluids for about 24 hours with minimal improvement in creatinine but developed worsening dyspnea due to CHF.  IV fluids discontinued.  Cardiology and nephrology consulted.     Assessment & Plan:   Acute kidney injury complicating CKD stage IIIa:  Baseline creatinine 0.9 in October 2021.  Presented with creatinine of 2.09.  Acute kidney injury suspected due to recent increased dose of Bumex, ARB, GI losses and hemodynamics.  ARB held and discontinued at time of discharge.  Renal ultrasound: No hydronephrosis.  She was briefly hydrated with IV fluids for about 24 hours, creatinine marginally improved but she developed dyspnea and decompensated CHF.  IV fluids were discontinued.  Nephrology consulted.  AKI likely secondary to hemodynamics and ARB.  Started IV Lasix with gradual improvement in creatinine.    Nephrology signed off and deferred diuretic management to  the cardiology service.  Creatinine had peaked to 1.99.    Creatinine has continued to gradually improve and down to 1.59 on day of discharge.  Having excellent urine output, 2550 mL documented yesterday.  At least -3.1 L since admission but this is not accurate.  Dyspnea and hypoxia resolved.    Cardiology have switched diuretics from Bumex on admission to oral Lasix at discharge.  They have arranged close outpatient follow-up on 3/18 with repeat labs.  Hypokalemia/hypomagnesemia:  Potassium replaced prior to discharge.  Continue low-dose potassium supplements while on high-dose Lasix.  Magnesium replaced/2.1.  Follow BMP and magnesium closely as  outpatient.  Pansensitive E. coli acute cystitis  Urine culture shows >100 K colonies of E. coli  Completed course with 3 days of IV ceftriaxone  Did have some dysuria PTA she says but seems to have resolved.  Also reported chills PTA.  Leukocytosis  May be reactive.?  Recent outpatient steroid use.  Resolved  Generalized weakness/debility and falls  Secondary to dehydration, acute kidney injury complicating advanced age and frailty.  No syncope.  There was some documentation of syncope by cardiology.  I verified with the patient in detail today and she clearly tells me that she was awake and aware of all that was happening at all times and had no LOC.  This was also verified by patient's daughter yesterday.  Therapies evaluation recommend home health PT and OT at discharge.  Acute on chronic diastolic CHF:  2D echo 07/20/7653: LVEF 60-65%.  Repeat 2D echo 3/11: LVEF 55% without regional wall motion abnormalities.  Follows with Dr. Claiborne Billings, Cardiology.  As noted above, diuretics held on admission due to AKI and dehydration, briefly hydrated with IV fluids which precipitated acute CHF  Cardiology following, increased IV Lasix to 80 mg every 12 hours due to ongoing exertional dyspnea.  Not much peripheral edema.  Clinically improved.  Euvolemic.  Cardiology follow-up appreciated.  Communicated with them.  As per their recommendations, Bumex discontinued at time of discharge, started Lasix 80 mg twice daily along with potassium supplements until close outpatient follow-up in their office.  Persistent atrial fibrillation  Continue amiodarone and reduced dose of apixaban due to AKI.  Currently in sinus rhythm  Nadolol discontinued since 3/11 due to sinus bradycardia and need for better blood pressures.  Cardiology following and indicated that nadolol effect may take a couple days to washout.  Bradycardia  improved and now mostly in the low 50s.  As per cardiology  follow-up today, at time of discharge, discontinued nadolol, continue amiodarone and continue reduced dose apixaban.  Transient hypoxia/acute hypoxic respiratory failure  D-dimer up to 2.44.  No evidence of pulmonary embolism on perfusion only scan.  Incentive spirometry and wean oxygen as tolerated.  Resolved RN will evaluate for home oxygen needs prior to discharge.  Essential hypertension:  Initially held ARB due to AKI, amlodipine and nadolol  Cardiology has resumed low-dose amlodipine with plans to uptitrate.  As per cardiology recommendations, continue low-dose amlodipine at discharge.  ARB discontinued due to AKI and nadolol discontinued due to bradycardia.  Prolonged QTC  Difficult to accurately interpret in the context of BBB morphology.  Replaced hypokalemia.  Magnesium 2.2.  Avoid QT prolonging medications.  Already on amiodarone.  Type II DM, newly diagnosed with hyperglycemia  Follow CBGs and SSI if needed.  A1c 6.8.  Diet controll at DC with outpatient follow-up with PCP.  Multiple bilateral benign exophytic renal cysts  Noted on renal ultrasound and as per  radiologist, no follow-up or characterization required.  Anemia  Possibly chronic disease.  Stable.  Body mass index is 30.91 kg/m.  Nutritional Status Nutrition Problem: Inadequate oral intake Etiology: poor appetite Signs/Symptoms: per patient/family report Interventions: Ensure Enlive (each supplement provides 350kcal and 20 grams of protein),MVI      Consultants:   Nephrology Cardiology   Discharge Instructions  Discharge Instructions    (HEART FAILURE PATIENTS) Call MD:  Anytime you have any of the following symptoms: 1) 3 pound weight gain in 24 hours or 5 pounds in 1 week 2) shortness of breath, with or without a dry hacking cough 3) swelling in the hands, feet or stomach 4) if you have to sleep on extra pillows at night in order to breathe.   Complete by: As  directed    Call MD for:  difficulty breathing, headache or visual disturbances   Complete by: As directed    Call MD for:  extreme fatigue   Complete by: As directed    Call MD for:  persistant dizziness or light-headedness   Complete by: As directed    Diet - low sodium heart healthy   Complete by: As directed    Diet Carb Modified   Complete by: As directed    Increase activity slowly   Complete by: As directed        Medication List    STOP taking these medications   bumetanide 1 MG tablet Commonly known as: BUMEX   dexamethasone 0.5 MG tablet Commonly known as: DECADRON   nadolol 20 MG tablet Commonly known as: Corgard   olmesartan 40 MG tablet Commonly known as: BENICAR     TAKE these medications   acetaminophen 500 MG tablet Commonly known as: TYLENOL Take 500-1,000 mg by mouth every 6 (six) hours as needed for moderate pain.   amiodarone 200 MG tablet Commonly known as: PACERONE Take 1 tablet (200 mg total) by mouth daily.   amLODipine 2.5 MG tablet Commonly known as: NORVASC Take 1 tablet (2.5 mg total) by mouth daily. What changed:   medication strength  how much to take   apixaban 2.5 MG Tabs tablet Commonly known as: Eliquis Take 1 tablet (2.5 mg total) by mouth 2 (two) times daily. What changed:   medication strength  how much to take   cholecalciferol 25 MCG (1000 UNIT) tablet Commonly known as: VITAMIN D3 Take 1,000 Units by mouth 2 (two) times daily.   furosemide 80 MG tablet Commonly known as: LASIX Take 1 tablet (80 mg total) by mouth 2 (two) times daily.   OSTEO BI-FLEX JOINT SHIELD PO Take 1 tablet by mouth 2 (two) times daily.   PATADAY OP Place 1 drop into the left eye daily.   potassium chloride SA 20 MEQ tablet Commonly known as: KLOR-CON Take 1 tablet (20 mEq total) by mouth 2 (two) times daily.   PRESERVISION AREDS 2 PO Take 1 capsule by mouth 2 (two) times daily.   ProAir HFA 108 (90 Base) MCG/ACT  inhaler Generic drug: albuterol Inhale 2 puffs into the lungs every 6 (six) hours as needed for wheezing or shortness of breath.   promethazine-dextromethorphan 6.25-15 MG/5ML syrup Commonly known as: PROMETHAZINE-DM Take 5 mLs by mouth 4 (four) times daily as needed for cough.   REFRESH DIGITAL OP Place 1 drop into the right eye daily as needed (dry eyes).   VITAMIN B-6 PO Take 1 capsule by mouth daily.   vitamin C 1000 MG tablet Take 1,000 mg  by mouth daily.   zinc gluconate 50 MG tablet Take 50 mg by mouth daily.      Allergies  Allergen Reactions  . Accupril [Quinapril Hcl] Cough  . Neosporin [Neomycin-Bacitracin Zn-Polymyx]     unknown  . Prednisone     Agitated and shaky  . Reglan [Metoclopramide] Other (See Comments)    tremors  . Tramadol Nausea And Vomiting  . Adhesive [Tape] Rash      Procedures/Studies: US RENAL  Result Date: 01/23/2021 CLINICAL DATA:  Acute kidney injury EXAM: RENAL / URINARY TRACT ULTRASOUND COMPLETE COMPARISON:  None. FINDINGS: Right Kidney: Renal measurements: 11.2 x 4.8 x 6.1 cm = volume: 174 mL. Echogenicity within normal limits. Multiple benign exophytic cysts. No mass or hydronephrosis visualized. Left Kidney: Renal measurements: 9.9 x 5.8 x 6.4 cm = volume: 190 mL. Echogenicity within normal limits. Multiple benign exophytic cysts. No mass or hydronephrosis visualized. Bladder: Appears normal for degree of bladder distention. Other: None. IMPRESSION: 1.  No significant abnormality identified. No hydronephrosis. 2. Multiple bilateral benign exophytic renal cysts, definitively benign, for which no further follow-up or characterization is required. Electronically Signed   By: [name] M.D.   On: 01/23/2021 10:20   NM Pulmonary Perf and Vent  Result Date: 01/23/2021 CLINICAL DATA:  Dyspnea for several months with lytic sources. EXAM: NUCLEAR MEDICINE PERFUSION LUNG SCAN TECHNIQUE: Perfusion images were obtained in multiple projections  after intravenous injection of radiopharmaceutical. Ventilation scans intentionally deferred if perfusion scan and chest x-ray adequate for interpretation during COVID 19 epidemic. RADIOPHARMACEUTICALS:  4.4 mCi Tc-7m MAA IV COMPARISON:  Chest radiograph from one day prior. FINDINGS: No significant pulmonary perfusion defects. Inferior midline and medial left lower chest defect correlates with mild cardiomegaly as seen on chest radiograph. IMPRESSION: No evidence of pulmonary embolism on perfusion only scan. Electronically Signed   By: Ilona Sorrel M.D.   On: 01/23/2021 12:13   DG CHEST PORT 1 VIEW  Result Date: 01/24/2021 CLINICAL DATA:  Dyspnea EXAM: PORTABLE CHEST 1 VIEW COMPARISON:  01/22/2021 chest radiograph. FINDINGS: Stable cardiomediastinal silhouette with mild cardiomegaly. No pneumothorax. Trace bilateral pleural effusions. Stable calcified 8 mm right costophrenic angle granuloma. Mild diffuse prominence and haziness of the parahilar interstitial markings, suggesting mild pulmonary edema. IMPRESSION: Mild congestive heart failure with trace bilateral pleural effusions. Electronically Signed   By: Ilona Sorrel M.D.   On: 01/24/2021 09:35   DG Chest Port 1 View  Result Date: 01/22/2021 CLINICAL DATA:  Weakness and shortness of breath, recent fall EXAM: PORTABLE CHEST 1 VIEW COMPARISON:  09/26/2019 FINDINGS: Cardiac shadow is enlarged but stable. Aortic calcifications are seen. Lungs are well aerated bilaterally. No focal infiltrate or sizable effusion is seen. Calcified granuloma in the right base is noted. No acute bony abnormality is noted. IMPRESSION: No acute abnormality noted. Electronically Signed   By: [name] M.D.   On: 01/22/2021 18:50   ECHOCARDIOGRAM COMPLETE  Result Date: 01/25/2021    ECHOCARDIOGRAM REPORT   Patient Name:   Briana [name] Date of Exam: 01/24/2021 Medical Rec #:  [id number]     Height:       61.0 in Accession #:    [id number]    Weight:       165.1 lb Date of  Birth:  [birth year]     BSA:          1.741 m Patient Age:    83 years      BP:  119/54 mmHg Patient Gender: F             HR:           50 bpm. Exam Location:  Inpatient Procedure: 2D Echo, Cardiac Doppler and Color Doppler Indications:    E08.14 Chronic systolic (congestive) heart failure  History:        Patient has prior history of Echocardiogram examinations, most                 recent 11/22/2018. Signs/Symptoms:Murmur; Risk                 Factors:Dyslipidemia and GERD. Cancer.  Sonographer:    Jonelle Sidle Dance Referring Phys: [id number] Edith Groleau D Jerrilynn Mikowski IMPRESSIONS  1. Left ventricular ejection fraction, by estimation, is 55%. The left ventricle has normal function. The left ventricle has no regional wall motion abnormalities. There is mild left ventricular hypertrophy. Left ventricular diastolic parameters are indeterminate.  2. Right ventricular systolic function is normal. The right ventricular size is mildly enlarged. There is moderately elevated pulmonary artery systolic pressure. The estimated right ventricular systolic pressure is 56.3 mmHg.  3. Left atrial size was moderately dilated.  4. Right atrial size was mildly dilated.  5. The mitral valve is grossly normal. Mild to moderate mitral valve regurgitation. No evidence of mitral stenosis.  6. The aortic valve is grossly normal. Aortic valve regurgitation is not visualized. No aortic stenosis is present.  7. The inferior vena cava is dilated in size with <50% respiratory variability, suggesting right atrial pressure of 15 mmHg. FINDINGS  Left Ventricle: Left ventricular ejection fraction, by estimation, is 55%. The left ventricle has normal function. The left ventricle has no regional wall motion abnormalities. The left ventricular internal cavity size was normal in size. There is mild left ventricular hypertrophy. Left ventricular diastolic parameters are indeterminate. Right Ventricle: The right ventricular size is mildly enlarged. No increase in  right ventricular wall thickness. Right ventricular systolic function is normal. There is moderately elevated pulmonary artery systolic pressure. The tricuspid regurgitant velocity is 3.10 m/s, and with an assumed right atrial pressure of 15 mmHg, the estimated right ventricular systolic pressure is 14.9 mmHg. Left Atrium: Left atrial size was moderately dilated. Right Atrium: Right atrial size was mildly dilated. Pericardium: Trivial pericardial effusion is present. Mitral Valve: The mitral valve is grossly normal. Mild to moderate mitral valve regurgitation. No evidence of mitral valve stenosis. The mean mitral valve gradient is 1.6 mmHg with average heart rate of 50 bpm. Tricuspid Valve: The tricuspid valve is normal in structure. Tricuspid valve regurgitation is trivial. No evidence of tricuspid stenosis. Aortic Valve: The aortic valve is grossly normal. Aortic valve regurgitation is not visualized. No aortic stenosis is present. Pulmonic Valve: The pulmonic valve was not well visualized. Pulmonic valve regurgitation is trivial. No evidence of pulmonic stenosis. Aorta: The aortic root is normal in size and structure. Venous: The inferior vena cava is dilated in size with less than 50% respiratory variability, suggesting right atrial pressure of 15 mmHg. IAS/Shunts: No atrial level shunt detected by color flow Doppler.  LEFT VENTRICLE PLAX 2D LVIDd:         4.33 cm  Diastology LVIDs:         2.89 cm  LV e' medial:    5.22 cm/s LV PW:         1.11 cm  LV E/e' medial:  23.9 LV IVS:        1.28 cm  LV e' lateral:  9.45 cm/s LVOT diam:     2.00 cm  LV E/e' lateral: 13.2 LV SV:         61 LV SV Index:   35 LVOT Area:     3.14 cm  RIGHT VENTRICLE             IVC RV Basal diam:  3.31 cm     IVC diam: 2.24 cm RV Mid diam:    1.83 cm RV S prime:     11.70 cm/s TAPSE (M-mode): 2.1 cm LEFT ATRIUM           Index LA diam:      4.90 cm 2.81 cm/m LA Vol (A2C): 78.0 ml 44.80 ml/m LA Vol (A4C): 88.4 ml 50.81 ml/m  AORTIC  VALVE LVOT Vmax:   91.40 cm/s LVOT Vmean:  63.000 cm/s LVOT VTI:    0.195 m  AORTA Ao Root diam: 2.80 cm Ao Asc diam:  3.20 cm MITRAL VALVE                 TRICUSPID VALVE MV Area (PHT): 2.29 cm      TR Peak grad:   38.4 mmHg MV Mean grad:  1.6 mmHg      TR Vmax:        310.00 cm/s MV Decel Time: 331 msec MR Peak grad:    82.1 mmHg   SHUNTS MR Mean grad:    58.0 mmHg   Systemic VTI:  0.20 m MR Vmax:         453.00 cm/s Systemic Diam: 2.00 cm MR Vmean:        359.0 cm/s MR PISA:         0.57 cm MR PISA Eff ROA: 5 mm MR PISA Radius:  0.30 cm MV E velocity: 124.50 cm/s Cherlynn Kaiser MD Electronically signed by Cherlynn Kaiser MD Signature Date/Time: 01/25/2021/6:19:46 AM    Final       Subjective: Patient reports feeling much better.  Denies complaints.  States that she did not sleep well last night and therefore ambulated a full lap across the hall without dyspnea, chest pain, dizziness or lightheadedness.  Clearly tells me that she did not pass out at home.  States that she must have fallen either while getting into bed or getting of bed without significant injuries and was aware at all times of what was happening.  She dragged herself/trip to the den but was unable to get up due to weakness.  She is aware that she will be staying with her daughter for couple days.  Discharge Exam:  Vitals:   01/27/21 1325 01/27/21 2133 01/28/21 0352 01/28/21 0500  BP: (!) 111/55 139/66 131/69   Pulse: (!) 50 (!) 52 (!) 51   Resp: 16 20 18    Temp: 98 F (36.7 C) 98.1 F (36.7 C) 97.9 F (36.6 C)   TempSrc: Oral Oral Oral   SpO2: 98% 92% 91%   Weight:    68.2 kg  Height:        General exam: Pleasant elderly female, moderately built and thinly nourished, lying comfortably propped up in bed without distress.    No dyspnea while speaking. Respiratory system: Clear to auscultation.  No increased work of breathing. Cardiovascular system: S1 and S2 heard, RRR.  No JVD or murmurs.  No pedal edema.  Telemetry  personally reviewed: Sinus bradycardia with heart rate mostly in the low 50s.  Very occasionally in the high 40s. Gastrointestinal system: Abdomen is nondistended, soft  and nontender. No organomegaly or masses felt. Normal bowel sounds heard. Central nervous system: Alert and oriented. No focal neurological deficits. Extremities: Symmetric 5 x 5 power. Skin: No rashes, lesions or ulcers Psychiatry: Judgement and insight appear intact. Mood & affect appropriate.     The results of significant diagnostics from this hospitalization (including imaging, microbiology, ancillary and laboratory) are listed below for reference.     Microbiology: Recent Results (from the past 240 hour(s))  Resp Panel by RT-PCR (Flu A&B, Covid) Nasopharyngeal Swab     Status: None   Collection Time: 01/22/21  8:04 PM   Specimen: Nasopharyngeal Swab; Nasopharyngeal(NP) swabs in vial transport medium  Result Value Ref Range Status   SARS Coronavirus 2 by RT PCR NEGATIVE NEGATIVE Final    Comment: (NOTE) SARS-CoV-2 target nucleic acids are NOT DETECTED.  The SARS-CoV-2 RNA is generally detectable in upper respiratory specimens during the acute phase of infection. The lowest concentration of SARS-CoV-2 viral copies this assay can detect is 138 copies/mL. A negative result does not preclude SARS-Cov-2 infection and should not be used as the sole basis for treatment or other patient management decisions. A negative result may occur with  improper specimen collection/handling, submission of specimen other than nasopharyngeal swab, presence of viral mutation(s) within the areas targeted by this assay, and inadequate number of viral copies(<138 copies/mL). A negative result must be combined with clinical observations, patient history, and epidemiological information. The expected result is Negative.  Fact Sheet for Patients:  EntrepreneurPulse.com.au  Fact Sheet for Healthcare Providers:   IncredibleEmployment.be  This test is no t yet approved or cleared by the [place] FDA and  has been authorized for detection and/or diagnosis of SARS-CoV-2 by FDA under an Emergency Use Authorization (EUA). This EUA will remain  in effect (meaning this test can be used) for the duration of the COVID-19 declaration under Section 564(b)(1) of the Act, 21 U.S.C.section 360bbb-3(b)(1), unless the authorization is terminated  or revoked sooner.       Influenza A by PCR NEGATIVE NEGATIVE Final   Influenza B by PCR NEGATIVE NEGATIVE Final    Comment: (NOTE) The Xpert Xpress SARS-CoV-2/FLU/RSV plus assay is intended as an aid in the diagnosis of influenza from Nasopharyngeal swab specimens and should not be used as a sole basis for treatment. Nasal washings and aspirates are unacceptable for Xpert Xpress SARS-CoV-2/FLU/RSV testing.  Fact Sheet for Patients: EntrepreneurPulse.com.au  Fact Sheet for Healthcare Providers: IncredibleEmployment.be  This test is not yet approved or cleared by the [place] FDA and has been authorized for detection and/or diagnosis of SARS-CoV-2 by FDA under an Emergency Use Authorization (EUA). This EUA will remain in effect (meaning this test can be used) for the duration of the COVID-19 declaration under Section 564(b)(1) of the Act, 21 U.S.C. section 360bbb-3(b)(1), unless the authorization is terminated or revoked.  Performed at [facility] [place] [address] [place], [place] [address]   Culture, Urine     Status: Abnormal   Collection Time: 01/22/21 10:30 PM   Specimen: Urine, Clean Catch  Result Value Ref Range Status   Specimen Description   Final    URINE, CLEAN CATCH Performed at [facility], [place] [address], [place], [place] [address]    Special Requests   Final    NONE Performed at [facility], [place] [address],  [place], [place] [address]    Culture >=100,000 COLONIES/mL ESCHERICHIA COLI (A)  Final   Report Status 01/25/2021  FINAL  Final   Organism ID, Bacteria ESCHERICHIA COLI (A)  Final      Susceptibility   Escherichia coli - MIC*    AMPICILLIN 4 SENSITIVE Sensitive     CEFAZOLIN <=4 SENSITIVE Sensitive     CEFEPIME <=0.12 SENSITIVE Sensitive     CEFTRIAXONE <=0.25 SENSITIVE Sensitive     CIPROFLOXACIN <=0.25 SENSITIVE Sensitive     GENTAMICIN <=1 SENSITIVE Sensitive     IMIPENEM <=0.25 SENSITIVE Sensitive     NITROFURANTOIN <=16 SENSITIVE Sensitive     TRIMETH/SULFA <=20 SENSITIVE Sensitive     AMPICILLIN/SULBACTAM <=2 SENSITIVE Sensitive     PIP/TAZO <=4 SENSITIVE Sensitive     * >=100,000 COLONIES/mL ESCHERICHIA COLI     Labs: CBC: Recent Labs  Lab 01/22/21 1745 01/23/21 0401 01/24/21 0349 01/25/21 0428 01/27/21 0328  WBC 15.6* 15.3* 14.8* 13.9* 11.2*  NEUTROABS 13.6*  --   --   --   --   HGB 11.8* 11.4* 10.8* 10.7* 11.6*  HCT 36.0 35.9* 33.2* 33.8* 35.0*  MCV 91.4 92.5 93.8 94.2 90.7  PLT 187 165 165 191 448    Basic Metabolic Panel: Recent Labs  Lab 01/22/21 2054 01/23/21 0401 01/24/21 0349 01/25/21 0428 01/26/21 0422 01/27/21 0328 01/28/21 0337  NA  --    < > 134* 138 138 140 138  K  --    < > 3.5 4.1 3.9 3.1* 3.2*  CL  --    < > 103 103 100 99 95*  CO2  --    < > 21* 25 27 29 30   GLUCOSE  --    < > 211* 191* 203* 186* 211*  BUN  --    < > 56* 50* 45* 40* 37*  CREATININE  --    < > 1.99* 1.89* 1.86* 1.63* 1.59*  CALCIUM  --    < > 8.7* 8.6* 8.7* 8.6* 8.7*  MG 2.2  --   --   --   --  1.6* 2.1   < > = values in this interval not displayed.    Liver Function Tests: Recent Labs  Lab 01/22/21 1745  AST 34  ALT 35  ALKPHOS 93  BILITOT 1.7*  PROT 5.6*  ALBUMIN 3.0*    CBG: Recent Labs  Lab 01/27/21 1130 01/27/21 1733 01/27/21 2135 01/28/21 0728 01/28/21 1127  GLUCAP 212* 149* 183* 216* 224*    Urinalysis    Component Value Date/Time    COLORURINE YELLOW 01/22/2021 2230   APPEARANCEUR CLOUDY (A) 01/22/2021 2230   LABSPEC 1.010 01/22/2021 2230   PHURINE 5.0 01/22/2021 2230   GLUCOSEU NEGATIVE 01/22/2021 2230   HGBUR SMALL (A) 01/22/2021 2230   BILIRUBINUR NEGATIVE 01/22/2021 2230   KETONESUR NEGATIVE 01/22/2021 2230   PROTEINUR 100 (A) 01/22/2021 2230   UROBILINOGEN 0.2 12/12/2012 1240   NITRITE NEGATIVE 01/22/2021 2230   LEUKOCYTESUR LARGE (A) 01/22/2021 2230      Time coordinating discharge: 40 minutes  SIGNED:  Vernell Leep, MD, FACP, [facility]. [facility]  To contact the attending provider between 7A-7P or the covering provider during after hours 7P-7A, please log into the web site [email or web address] and access using universal [place] password for that web site. If you do not have the password, please call the hospital operator.

## 2021-01-28 NOTE — Progress Notes (Signed)
SATURATION QUALIFICATIONS: (This note is used to comply with regulatory documentation for home oxygen)  Patient Saturations on Room Air at Rest = 93%  Patient Saturations on Room Air while Ambulating = 92%  Patient Saturations on 0 L Room Air while ambulating  Please briefly explain why patient needs home oxygen: Patient does not require oxygen while ambulating or at rest

## 2021-01-28 NOTE — Telephone Encounter (Signed)
Pt is scheduled for a TOC hospital f/u on 01/31/21 at 2:15 PM with Almyra Deforest per [facility].

## 2021-01-28 NOTE — Telephone Encounter (Signed)
Patient is currently in the hospital 01/28/21

## 2021-01-29 ENCOUNTER — Ambulatory Visit: Payer: Medicare Other | Admitting: Cardiovascular Disease

## 2021-01-29 ENCOUNTER — Telehealth: Payer: Self-pay | Admitting: *Deleted

## 2021-01-29 NOTE — Telephone Encounter (Signed)
Called patient on 01/29/2021 , 12:38 PM in an attempt to reach the patient for a hospital follow up. Spoke with the patient and her daughter, [name]. They both confirm the patient is feeling much better.  Admit date: 01/22/21 Discharge: 01/28/21   She does not have any questions or concerns about medications from the hospital admission. The patient's medications were reviewed over the phone, they were counseled to bring in all current medications to the hospital follow up visit.   I advised the patient to call if any questions or concerns arise about the hospital admission or medications. Daughter states she is helping the patient with medications, at this time.   Home health was started in the hospital. Patient has not been contacted yet about setting up home health. All questions were answered and a follow up appointment was made. Appointment scheduled on 02/05/2021 with Garnet Sierras, NP.  Prior to Admission medications   Medication Sig Start Date End Date Taking? Authorizing Provider  acetaminophen (TYLENOL) 500 MG tablet Take 500-1,000 mg by mouth every 6 (six) hours as needed for moderate pain.    [provider]  amiodarone (PACERONE) 200 MG tablet Take 1 tablet (200 mg total) by mouth daily. 11/21/20   Troy Sine, MD  amLODipine (NORVASC) 2.5 MG tablet Take 1 tablet (2.5 mg total) by mouth daily. 01/28/21   Hongalgi, Lenis Dickinson, MD  apixaban (ELIQUIS) 2.5 MG TABS tablet Take 1 tablet (2.5 mg total) by mouth 2 (two) times daily. 01/28/21   Hongalgi, Lenis Dickinson, MD  Ascorbic Acid (VITAMIN C) 1000 MG tablet Take 1,000 mg by mouth daily.     [provider]  Carboxymeth-Glycerin-Polysorb (REFRESH DIGITAL OP) Place 1 drop into the right eye daily as needed (dry eyes).    [provider]  cholecalciferol (VITAMIN D3) 25 MCG (1000 UT) tablet Take 1,000 Units by mouth 2 (two) times daily.    [provider]  furosemide (LASIX) 80 MG tablet Take 1 tablet (80 mg total)  by mouth 2 (two) times daily. 01/28/21   Hongalgi, Lenis Dickinson, MD  Misc Natural Products (OSTEO BI-FLEX JOINT SHIELD PO) Take 1 tablet by mouth 2 (two) times daily.     [provider]  Multiple Vitamins-Minerals (PRESERVISION AREDS 2 PO) Take 1 capsule by mouth 2 (two) times daily.    [provider]  Olopatadine HCl (PATADAY OP) Place 1 drop into the left eye daily.    [provider]  potassium chloride SA (KLOR-CON) 20 MEQ tablet Take 1 tablet (20 mEq total) by mouth 2 (two) times daily. 01/28/21   Hongalgi, Lenis Dickinson, MD  PROAIR HFA 108 [phone] Base) MCG/ACT inhaler Inhale 2 puffs into the lungs every 6 (six) hours as needed for wheezing or shortness of breath. 01/28/21   Hongalgi, Lenis Dickinson, MD  promethazine-dextromethorphan (PROMETHAZINE-DM) 6.25-15 MG/5ML syrup Take 5 mLs by mouth 4 (four) times daily as needed for cough. 11/13/20   [name], NP  Pyridoxine HCl (VITAMIN B-6 PO) Take 1 capsule by mouth daily.    [provider]  zinc gluconate 50 MG tablet Take 50 mg by mouth daily.    [provider]

## 2021-01-29 NOTE — Telephone Encounter (Signed)
I left a message for the patient to return my call. No answer at patient's home number. Left message on daughter, Rhonda's, cell number to call me regarding the patient and the need for a follow up hospital visit.

## 2021-01-29 NOTE — Telephone Encounter (Signed)
Patient contacted regarding discharge from [facility] on 01/28/21.  Patient understands to follow up with provider HAO MENG PA on 01/31/21 at 2:15 PM at [facility]. Patient understands discharge instructions? YES Patient understands medications and regiment? YES Patient understands to bring all medications to this visit? YES

## 2021-01-31 ENCOUNTER — Encounter: Payer: Self-pay | Admitting: Physician Assistant

## 2021-01-31 ENCOUNTER — Ambulatory Visit: Payer: Medicare Other | Admitting: Physician Assistant

## 2021-01-31 ENCOUNTER — Other Ambulatory Visit: Payer: Self-pay

## 2021-01-31 VITALS — BP 122/60 | HR 60 | Ht 61.0 in | Wt 149.8 lb

## 2021-01-31 DIAGNOSIS — I5032 Chronic diastolic (congestive) heart failure: Secondary | ICD-10-CM

## 2021-01-31 DIAGNOSIS — I480 Paroxysmal atrial fibrillation: Secondary | ICD-10-CM | POA: Diagnosis not present

## 2021-01-31 DIAGNOSIS — N179 Acute kidney failure, unspecified: Secondary | ICD-10-CM | POA: Diagnosis not present

## 2021-01-31 NOTE — Progress Notes (Signed)
Cardiology Office Note:    Date:  02/02/2021   ID:  Briana [name], DOB Jun 06, 1934, MRN [id number]  PCP:  Unk Pinto, [place]  Cardiologist:  Shelva Majestic, MD  Advanced Practice Provider:  No care team member to display Electrophysiologist:  None   Referring MD: Unk Pinto, MD   Chief Complaint  Patient presents with  . Follow-up    Seen for Dr. Claiborne Billings.     History of Present Illness:    Briana [name] is a 87 y.o. female with a hx of chronic diastolic heart failure, atrial fibrillation diagnosed in 2020, GERD, hyperlipidemia, prediabetes and history of IBS.  She underwent cardioversion on 12/20/2018 and again on 06/16/2019.  Her second cardioversion was complicated by significant bradycardia prompting the discontinuation of atenolol.  Atenolol was later restarted.  Subsequent echocardiogram shows EF 60 to 65%.  She was also treated for acute congestive heart failure and was discharged on Bumex.  He was last seen by [name], NP in September 2021 at which time she was doing well.  More recently, patient contacted our office on 01/09/2021 with complaint of weight gain and lower extremity edema, she was instructed to take additional Bumex at the time and as needed for weight gain of 2 to 3 pounds overnight or 5 pounds in a single week.  She called again on 01/16/2021 to report difficulty breathing and was instructed to take additional dose of Bumex.  After that, she had 3 days of progressive weakness and difficulty ambulating.  Neighbor came to her house to check on her on 01/22/2021 and found her on the floor prompted EMS activation.  She was hypoxic with O2 saturation of 88% which improved with 2 L oxygen.  While in the ED, she was tachypneic.  Potassium was 2.9.  Initial creatinine was 2.09 whereas her baseline creatinine supposed to be 0.9.  White blood cell count was elevated at 15.6.  D-dimer elevated.  COVID-19 negative.  TSH 0.32.  VQ scan was  negative for PE.  She was started on IV fluid given concern for dehydration and antibiotic for possible UTI.  Echocardiogram obtained on 01/24/2021 showed EF 55%, moderately elevated PA peak pressure of 53.4 mmHg, mild to moderate MR.  Home olmesartan and nadolol were held due to acute renal failure and bradycardia.  Patient was initially given IV fluid however due to concern for acute on chronic diastolic heart failure, she was subsequently placed on IV diuretic.  With IV diuresis, her renal function improved. Eliquis was reduced to 2.5 mg twice daily given age and creatinine clearance.  She was eventually discharged on amlodipine 2.5 mg daily, amiodarone 200 mg daily, Lasix 80 mg twice daily, Eliquis 2.5 mg twice daily and potassium supplement 20 mEq twice daily.  Her weight went down from 163 pounds down to 150 pounds by the time of discharge.  Patient presents today for follow-up.  Her weight is 149.8 pounds.  She still has some degree of dyspnea on exertion however no shortness of breath at rest.  She does have a tenderness under her breast on both side of the body.  This tenderness does not sound like cardiac in nature.  I asked her to adjust her bra once she get home to see if they will make the tenderness improve.  Otherwise she will need a basic metabolic panel today.  If she has no significant lower extremity edema, her lung is clear.  EKG shows sinus  rhythm.   Past Medical History:  Diagnosis Date  . Acute on chronic diastolic (congestive) heart failure ([place]) 11/21/2018  . Cancer ([place])    skin cancer  . DDD (degenerative disc disease)   . Diverticula of colon   . Diverticulitis   . DJD (degenerative joint disease)   . GERD (gastroesophageal reflux disease)    takes ranitidine  . Heart murmur   . History of hiatal hernia   . History of pneumonia   . Hx of irritable bowel syndrome   . Hyperlipidemia   . Occasional tremors   . Osteoporosis   . Pre-diabetes   . Varicose veins   . Vitamin  D deficiency     Past Surgical History:  Procedure Laterality Date  . APPENDECTOMY    . BREAST EXCISIONAL BIOPSY Right   . BREAST SURGERY Right    fluid drained from breast  . CARDIOVERSION N/A 12/20/2018   Procedure: CARDIOVERSION;  Surgeon: Buford Dresser, MD;  Location: [facility] ENDOSCOPY;  Service: Cardiovascular;  Laterality: N/A;  . CARDIOVERSION N/A 06/16/2019   Procedure: CARDIOVERSION;  Surgeon: Fay Records, MD;  Location: [place];  Service: Cardiovascular;  Laterality: N/A;  . COLONOSCOPY    . ESOPHAGOGASTRODUODENOSCOPY    . JOINT REPLACEMENT Left    left hip  . LUMBAR LAMINECTOMY/DECOMPRESSION MICRODISCECTOMY N/A 06/05/2015   Procedure: L3-L5 DECOMPRESSION ;  Surgeon: Melina Schools, MD;  Location: [place];  Service: Orthopedics;  Laterality: N/A;  . OPEN REDUCTION INTERNAL FIXATION (ORIF) DISTAL RADIAL FRACTURE Right 01/10/2014   Procedure: OPEN REDUCTION INTERNAL FIXATION (ORIF) DISTAL RADIAL FRACTURE;  Surgeon: Linna Hoff, MD;  Location: [place];  Service: Orthopedics;  Laterality: Right;  . SPINAL CORD DECOMPRESSION  06/05/2015   L3 L 5  . THYROID SURGERY    . TONSILLECTOMY    . TUBAL LIGATION    . varicose veins stripped      Current Medications: Current Meds  Medication Sig  . acetaminophen (TYLENOL) 500 MG tablet Take 500-1,000 mg by mouth every 6 (six) hours as needed for moderate pain.  Marland Kitchen amiodarone (PACERONE) 200 MG tablet Take 1 tablet (200 mg total) by mouth daily.  Marland Kitchen amLODipine (NORVASC) 2.5 MG tablet Take 1 tablet (2.5 mg total) by mouth daily.  Marland Kitchen apixaban (ELIQUIS) 2.5 MG TABS tablet Take 1 tablet (2.5 mg total) by mouth 2 (two) times daily.  . Ascorbic Acid (VITAMIN C) 1000 MG tablet Take 1,000 mg by mouth daily.   . Carboxymeth-Glycerin-Polysorb (REFRESH DIGITAL OP) Place 1 drop into the right eye daily as needed (dry eyes).  . cholecalciferol (VITAMIN D3) 25 MCG (1000 UT) tablet Take 1,000 Units by mouth 2 (two) times daily.  . furosemide (LASIX)  80 MG tablet Take 1 tablet (80 mg total) by mouth 2 (two) times daily.  . Misc Natural Products (OSTEO BI-FLEX JOINT SHIELD PO) Take 1 tablet by mouth 2 (two) times daily.   . Multiple Vitamins-Minerals (PRESERVISION AREDS 2 PO) Take 1 capsule by mouth 2 (two) times daily.  . Olopatadine HCl (PATADAY OP) Place 1 drop into the left eye daily.  . potassium chloride SA (KLOR-CON) 20 MEQ tablet Take 1 tablet (20 mEq total) by mouth 2 (two) times daily.  Marland Kitchen PROAIR HFA 108 (90 Base) MCG/ACT inhaler Inhale 2 puffs into the lungs every 6 (six) hours as needed for wheezing or shortness of breath.  . promethazine-dextromethorphan (PROMETHAZINE-DM) 6.25-15 MG/5ML syrup Take 5 mLs by mouth 4 (four) times daily as needed for cough.  Marland Kitchen  Pyridoxine HCl (VITAMIN B-6 PO) Take 1 capsule by mouth daily.  Marland Kitchen zinc gluconate 50 MG tablet Take 50 mg by mouth daily.     Allergies:   Accupril [quinapril hcl], Neosporin [neomycin-bacitracin zn-polymyx], Prednisone, Reglan [metoclopramide], Tramadol, and Adhesive [tape]   Social History   Socioeconomic History  . Marital status: Widowed    Spouse name: Not on file  . Number of children: Not on file  . Years of education: Not on file  . Highest education level: Not on file  Occupational History  . Not on file  Tobacco Use  . Smoking status: Never Smoker  . Smokeless tobacco: Never Used  Substance and Sexual Activity  . Alcohol use: No  . Drug use: No  . Sexual activity: Not on file  Other Topics Concern  . Not on file  Social History Narrative  . Not on file   Social Determinants of Health   Financial Resource Strain: Not on file  Food Insecurity: Not on file  Transportation Needs: Not on file  Physical Activity: Not on file  Stress: Not on file  Social Connections: Not on file     Family History: The patient's family history includes Cancer in her brother; Heart disease in her sister; Hyperlipidemia in her mother; Hypertension in her mother.  ROS:    Please see the history of present illness.     All other systems reviewed and are negative.  EKGs/Labs/Other Studies Reviewed:    The following studies were reviewed today:  Echo 01/24/2021 1. Left ventricular ejection fraction, by estimation, is 55%. The left  ventricle has normal function. The left ventricle has no regional wall  motion abnormalities. There is mild left ventricular hypertrophy. Left  ventricular diastolic parameters are  indeterminate.  2. Right ventricular systolic function is normal. The right ventricular  size is mildly enlarged. There is moderately elevated pulmonary artery  systolic pressure. The estimated right ventricular systolic pressure is  22.0 mmHg.  3. Left atrial size was moderately dilated.  4. Right atrial size was mildly dilated.  5. The mitral valve is grossly normal. Mild to moderate mitral valve  regurgitation. No evidence of mitral stenosis.  6. The aortic valve is grossly normal. Aortic valve regurgitation is not  visualized. No aortic stenosis is present.  7. The inferior vena cava is dilated in size with <50% respiratory  variability, suggesting right atrial pressure of 15 mmHg.   EKG:  EKG is ordered today.  The ekg ordered today demonstrates sinus rhythm with right bundle branch block, no significant ST-T wave changes.  Recent Labs: 01/22/2021: ALT 35; B Natriuretic Peptide 464.4 01/23/2021: TSH 0.227 01/27/2021: Hemoglobin 11.6; Platelets 263 01/28/2021: Magnesium 2.1 01/31/2021: BUN 32; Creatinine, Ser 1.81; Potassium 5.2; Sodium 135  Recent Lipid Panel    Component Value Date/Time   CHOL 202 (H) 02/28/2020 1459   TRIG 67 02/28/2020 1459   HDL 67 02/28/2020 1459   CHOLHDL 3.0 02/28/2020 1459   VLDL 15 05/04/2017 1339   LDLCALC 119 (H) 02/28/2020 1459     Risk Assessment/Calculations:       Physical Exam:    VS:  BP 122/60   Pulse 60   Ht 5\' 1"  (1.549 m)   Wt 149 lb 12.8 oz (67.9 kg)   SpO2 93%   BMI 28.30 kg/m      Wt Readings from Last 3 Encounters:  01/31/21 149 lb 12.8 oz (67.9 kg)  01/28/21 150 lb 5.7 oz (68.2 kg)  11/13/20 161  lb (73 kg)     GEN:  Well nourished, well developed in no acute distress HEENT: Normal NECK: No JVD; No carotid bruits LYMPHATICS: No lymphadenopathy CARDIAC: RRR, no murmurs, rubs, gallops RESPIRATORY:  Clear to auscultation without rales, wheezing or rhonchi  ABDOMEN: Soft, non-tender, non-distended MUSCULOSKELETAL:  No edema; No deformity  SKIN: Warm and dry NEUROLOGIC:  Alert and oriented x 3 PSYCHIATRIC:  Normal affect   ASSESSMENT:    1. Chronic diastolic CHF (congestive heart failure) ([place])   2. AKI (acute kidney injury) ([place])   3. PAF (paroxysmal atrial fibrillation) ([facility])    PLAN:    In order of problems listed above:  1. Chronic diastolic heart failure: Euvolemic on physical exam.  Obtain basic metabolic panel.  2. AKI: Seen 3 recent hospitalization.  We will repeat basic metabolic panel  3. PAF: Continue amiodarone and Eliquis.         Medication Adjustments/Labs and Tests Ordered: Current medicines are reviewed at length with the patient today.  Concerns regarding medicines are outlined above.  Orders Placed This Encounter  Procedures  . Basic metabolic panel  . EKG 12-Lead   No orders of the defined types were placed in this encounter.   Patient Instructions  Medication Instructions:  Your physician recommends that you continue on your current medications as directed. Please refer to the Current Medication list given to you today.  *If you need a refill on your cardiac medications before your next appointment, please call your pharmacy*  Lab Work: Your physician recommends that you return for lab work TODAY:   BMET  If you have labs (blood work) drawn today and your tests are completely normal, you will receive your results only by: Marland Kitchen MyChart Message (if you have MyChart) OR . A paper copy in the mail If you have any  lab test that is abnormal or we need to change your treatment, we will call you to review the results.  Testing/Procedures: NONE ordered at this time of appointment   Follow-Up: At [facility], you and your health needs are our priority.  As part of our continuing mission to provide you with exceptional heart care, we have created designated Provider Care Teams.  These Care Teams include your primary Cardiologist (physician) and Advanced Practice Providers (APPs -  Physician Assistants and Nurse Practitioners) who all work together to provide you with the care you need, when you need it.  We recommend signing up for the patient portal called "MyChart".  Sign up information is provided on this After Visit Summary.  MyChart is used to connect with patients for Virtual Visits (Telemedicine).  Patients are able to view lab/test results, encounter notes, upcoming appointments, etc.  Non-urgent messages can be sent to your provider as well.   To learn more about what you can do with MyChart, go to [email or web address].    Your next appointment:   1 month(s)  The format for your next appointment:   In Person  Provider:   Shelva Majestic, MD or APP   Other Instructions      Signed, Almyra Deforest, [place]  02/02/2021 11:48 PM    [place]

## 2021-01-31 NOTE — Patient Instructions (Addendum)
Medication Instructions:  Your physician recommends that you continue on your current medications as directed. Please refer to the Current Medication list given to you today.  *If you need a refill on your cardiac medications before your next appointment, please call your pharmacy*  Lab Work: Your physician recommends that you return for lab work TODAY:   BMET  If you have labs (blood work) drawn today and your tests are completely normal, you will receive your results only by: Marland Kitchen MyChart Message (if you have MyChart) OR . A paper copy in the mail If you have any lab test that is abnormal or we need to change your treatment, we will call you to review the results.  Testing/Procedures: NONE ordered at this time of appointment   Follow-Up: At [facility], you and your health needs are our priority.  As part of our continuing mission to provide you with exceptional heart care, we have created designated Provider Care Teams.  These Care Teams include your primary Cardiologist (physician) and Advanced Practice Providers (APPs -  Physician Assistants and Nurse Practitioners) who all work together to provide you with the care you need, when you need it.  We recommend signing up for the patient portal called "MyChart".  Sign up information is provided on this After Visit Summary.  MyChart is used to connect with patients for Virtual Visits (Telemedicine).  Patients are able to view lab/test results, encounter notes, upcoming appointments, etc.  Non-urgent messages can be sent to your provider as well.   To learn more about what you can do with MyChart, go to [email or web address].    Your next appointment:   1 month(s)  The format for your next appointment:   In Person  Provider:   Shelva Majestic, MD or APP   Other Instructions

## 2021-02-01 LAB — BASIC METABOLIC PANEL
BUN/Creatinine Ratio: 18 (ref 12–28)
BUN: 32 mg/dL — ABNORMAL HIGH (ref 8–27)
CO2: 26 mmol/L (ref 20–29)
Calcium: 9.8 mg/dL (ref 8.7–10.3)
Chloride: 93 mmol/L — ABNORMAL LOW (ref 96–106)
Creatinine, Ser: 1.81 mg/dL — ABNORMAL HIGH (ref 0.57–1.00)
Glucose: 196 mg/dL — ABNORMAL HIGH (ref 65–99)
Potassium: 5.2 mmol/L (ref 3.5–5.2)
Sodium: 135 mmol/L (ref 134–144)
eGFR: 27 mL/min/{1.73_m2} — ABNORMAL LOW (ref >59)

## 2021-02-02 ENCOUNTER — Encounter: Payer: Self-pay | Admitting: Physician Assistant

## 2021-02-03 ENCOUNTER — Telehealth: Payer: Self-pay | Admitting: Cardiovascular Disease

## 2021-02-03 DIAGNOSIS — E559 Vitamin D deficiency, unspecified: Secondary | ICD-10-CM | POA: Diagnosis not present

## 2021-02-03 DIAGNOSIS — M519 Unspecified thoracic, thoracolumbar and lumbosacral intervertebral disc disorder: Secondary | ICD-10-CM | POA: Diagnosis not present

## 2021-02-03 DIAGNOSIS — I2510 Atherosclerotic heart disease of native coronary artery without angina pectoris: Secondary | ICD-10-CM | POA: Diagnosis not present

## 2021-02-03 DIAGNOSIS — Z7901 Long term (current) use of anticoagulants: Secondary | ICD-10-CM | POA: Diagnosis not present

## 2021-02-03 DIAGNOSIS — I130 Hypertensive heart and chronic kidney disease with heart failure and stage 1 through stage 4 chronic kidney disease, or unspecified chronic kidney disease: Secondary | ICD-10-CM | POA: Diagnosis not present

## 2021-02-03 DIAGNOSIS — M150 Primary generalized (osteo)arthritis: Secondary | ICD-10-CM | POA: Diagnosis not present

## 2021-02-03 DIAGNOSIS — E1165 Type 2 diabetes mellitus with hyperglycemia: Secondary | ICD-10-CM | POA: Diagnosis not present

## 2021-02-03 DIAGNOSIS — E1122 Type 2 diabetes mellitus with diabetic chronic kidney disease: Secondary | ICD-10-CM | POA: Diagnosis not present

## 2021-02-03 DIAGNOSIS — I5032 Chronic diastolic (congestive) heart failure: Secondary | ICD-10-CM | POA: Diagnosis not present

## 2021-02-03 DIAGNOSIS — E785 Hyperlipidemia, unspecified: Secondary | ICD-10-CM | POA: Diagnosis not present

## 2021-02-03 DIAGNOSIS — D631 Anemia in chronic kidney disease: Secondary | ICD-10-CM | POA: Diagnosis not present

## 2021-02-03 DIAGNOSIS — I4891 Unspecified atrial fibrillation: Secondary | ICD-10-CM | POA: Diagnosis not present

## 2021-02-03 DIAGNOSIS — N1831 Chronic kidney disease, stage 3a: Secondary | ICD-10-CM | POA: Diagnosis not present

## 2021-02-03 DIAGNOSIS — Z9181 History of falling: Secondary | ICD-10-CM | POA: Diagnosis not present

## 2021-02-03 NOTE — Progress Notes (Signed)
Renal function slightly down, potassium near upper border of normal. Recommend reduce lasix to 80mg  AM/40mg  PM, reduce potassium chloride to 42meq daily. Repeat BMET in 1 week

## 2021-02-03 NOTE — Telephone Encounter (Signed)
Returned call to patients daughter (okay per DPR), who states that patient has gained 3 pounds over night. Patients daughter states that patient's feet and ankles are not swollen, and states patient is slightly fatigued. Per patients daughter patient had an episode last night of fluttering in her chest but states that per patient it resolved quickly. Patients daughter stated that patient is slightly short of breath with exertion, but states that Physical Therapy saw patient today and states that per PT patient looked good. Patient's daughter would like to see if Almyra Deforest  PA-C has any recommendations regarding this.   Advised patients daughter of Finis Bud recommendations regarding her recent BMETAlmyra Deforest, [place]  02/03/2021 12:06 AM EDT Back to Top     Renal function slightly down, potassium near upper border of normal. Recommend reduce lasix to 80mg  AM/40mg  PM, reduce potassium chloride to 51meq daily. Repeat BMET in 1 week   Patients daughter would like to know if patient should still decrease dose of Lasix or not given the 3 pound weight gain. Advised patients daughter I will forward message to [facility] PA-C. Patients daughter verbalized understanding.

## 2021-02-03 NOTE — Telephone Encounter (Signed)
Pt c/o swelling: STAT is pt has developed SOB within 24 hours  1) How much weight have you gained and in what time span? 3 pounds overnight   2) If swelling, where is the swelling located? Feet are not swollen  3) Are you currently taking a fluid pill? yes  4) Are you currently SOB? no  5) Do you have a log of your daily weights (if so, list)?   02/02/21: 144  02/03/21: 147  6) Have you gained 3 pounds in a day or 5 pounds in a week? 3 pounds overnight   7) Have you traveled recently? No  Patient states either of her daughters Burley Saver or [place]ta) may have called to report the patient's weight gain overnight. The patient feels fine but still wanted to report the weight gain in case her daughters could not get through .  If you can not reach the patient, please call her Daughter [name] at [phone]

## 2021-02-04 ENCOUNTER — Telehealth: Payer: Self-pay | Admitting: *Deleted

## 2021-02-04 DIAGNOSIS — I2510 Atherosclerotic heart disease of native coronary artery without angina pectoris: Secondary | ICD-10-CM | POA: Diagnosis not present

## 2021-02-04 DIAGNOSIS — Z9181 History of falling: Secondary | ICD-10-CM | POA: Diagnosis not present

## 2021-02-04 DIAGNOSIS — Z7901 Long term (current) use of anticoagulants: Secondary | ICD-10-CM | POA: Diagnosis not present

## 2021-02-04 DIAGNOSIS — M519 Unspecified thoracic, thoracolumbar and lumbosacral intervertebral disc disorder: Secondary | ICD-10-CM | POA: Diagnosis not present

## 2021-02-04 DIAGNOSIS — I130 Hypertensive heart and chronic kidney disease with heart failure and stage 1 through stage 4 chronic kidney disease, or unspecified chronic kidney disease: Secondary | ICD-10-CM | POA: Diagnosis not present

## 2021-02-04 DIAGNOSIS — E559 Vitamin D deficiency, unspecified: Secondary | ICD-10-CM | POA: Diagnosis not present

## 2021-02-04 DIAGNOSIS — E785 Hyperlipidemia, unspecified: Secondary | ICD-10-CM | POA: Diagnosis not present

## 2021-02-04 DIAGNOSIS — D631 Anemia in chronic kidney disease: Secondary | ICD-10-CM | POA: Diagnosis not present

## 2021-02-04 DIAGNOSIS — E1165 Type 2 diabetes mellitus with hyperglycemia: Secondary | ICD-10-CM | POA: Diagnosis not present

## 2021-02-04 DIAGNOSIS — E1122 Type 2 diabetes mellitus with diabetic chronic kidney disease: Secondary | ICD-10-CM | POA: Diagnosis not present

## 2021-02-04 DIAGNOSIS — M150 Primary generalized (osteo)arthritis: Secondary | ICD-10-CM | POA: Diagnosis not present

## 2021-02-04 DIAGNOSIS — I4891 Unspecified atrial fibrillation: Secondary | ICD-10-CM | POA: Diagnosis not present

## 2021-02-04 DIAGNOSIS — I5032 Chronic diastolic (congestive) heart failure: Secondary | ICD-10-CM | POA: Diagnosis not present

## 2021-02-04 DIAGNOSIS — N1831 Chronic kidney disease, stage 3a: Secondary | ICD-10-CM | POA: Diagnosis not present

## 2021-02-04 NOTE — Telephone Encounter (Signed)
Left message to inform physical therapist it is OK to visit patient 2 times weekly for 2 weeks and 1 time weekly for 6 weeks, per Dr Melford Aase. OT evaluation is OK.

## 2021-02-05 ENCOUNTER — Encounter: Payer: Self-pay | Admitting: Adult Health Nurse Practitioner

## 2021-02-05 ENCOUNTER — Other Ambulatory Visit: Payer: Self-pay

## 2021-02-05 ENCOUNTER — Ambulatory Visit (INDEPENDENT_AMBULATORY_CARE_PROVIDER_SITE_OTHER): Payer: Medicare Other | Admitting: Adult Health Nurse Practitioner

## 2021-02-05 VITALS — BP 130/82 | HR 63 | Temp 97.5°F | Wt 146.0 lb

## 2021-02-05 DIAGNOSIS — E1122 Type 2 diabetes mellitus with diabetic chronic kidney disease: Secondary | ICD-10-CM | POA: Diagnosis not present

## 2021-02-05 DIAGNOSIS — I10 Essential (primary) hypertension: Secondary | ICD-10-CM

## 2021-02-05 DIAGNOSIS — I480 Paroxysmal atrial fibrillation: Secondary | ICD-10-CM

## 2021-02-05 DIAGNOSIS — I5032 Chronic diastolic (congestive) heart failure: Secondary | ICD-10-CM

## 2021-02-05 DIAGNOSIS — D6869 Other thrombophilia: Secondary | ICD-10-CM | POA: Diagnosis not present

## 2021-02-05 DIAGNOSIS — N183 Chronic kidney disease, stage 3 unspecified: Secondary | ICD-10-CM

## 2021-02-05 DIAGNOSIS — N179 Acute kidney failure, unspecified: Secondary | ICD-10-CM

## 2021-02-05 NOTE — Progress Notes (Signed)
Hospital follow up  Assessment and Plan: Hospital visit follow up for AKI  Briana [name] was seen today for hospitalization follow-up.  Diagnoses and all orders for this visit:  Chronic diastolic CHF (congestive heart failure) ([place]) -     Ambulatory referral to [facility] shower chair and transfer bench Continue Home health PT/OT  AKI (acute kidney injury) ([place]) Just had labs, improving  Essential hypertension Paroxysmal atrial fibrillation (HCC) Continue current medications:amiodarone200mg  daily Lasix 80mg  BID and potassium 22mEq daily Monitor blood pressure at home; call if consistently over 130/80 Continue DASH diet.   Reminder to go to the ER if any CP, SOB, nausea, dizziness, severe HA, changes vision/speech, left arm numbness and tingling and jaw pain.  Acquired thrombophilia ([place]) abixaban 2.5mg  BID Monitor for S&S fo bleeding Safety discussed  CKD stage 3 due to type 2 diabetes mellitus (HCC) Increase fluids  Avoid NSAIDS Blood pressure control Monitor sugars  Will continue to monitor  Defer labs today, has close follow up 03/05/21 to recheck labs  Further disposition pending results if labs check today. Discussed med's effects and SE's.   Over 30 minutes of face to face interview, exam, counseling, chart review, and critical decision making was performed.    All medications were reviewed with patient and family and fully reconciled. All questions answered fully, and patient and family members were encouraged to call the office with any further questions or concerns. Discussed goal to avoid readmission related to this diagnosis.  Medications Discontinued During This Encounter  Medication Reason  . promethazine-dextromethorphan (PROMETHAZINE-DM) 6.25-15 MG/5ML syrup Completed Course    CAN NOT DO FOR BCBS REGULAR OR MEDICARE Over 40 minutes of exam, counseling, chart review, and complex, high/moderate level critical decision making was performed this visit.    Future Appointments  Date Time Provider Amanda  03/03/2021  4:00 PM [facility] DX DEXA 1 [facility]  03/05/2021  3:00 PM Unk Pinto, MD [facility] None  03/07/2021  2:45 PM Lendon Colonel, NP [facility]     HPI 87 y.o.female presents for follow up for transition from recent hospitalization or SNIF stay. Admit date to the hospital was 01/22/21, patient was discharged from the hospital on 01/28/21 and our clinical staff contacted the office the day after discharge to set up a follow up appointment. The discharge summary, medications, and diagnostic test results were reviewed before meeting with the patient. The patient was admitted for:   Discharge Diagnoses:  Principal Problem:   AKI (acute kidney injury) ([place]) Active Problems:   Essential hypertension   Unspecified atrial fibrillation (HCC)   Chronic diastolic heart failure (HCC)   Acquired thrombophilia ([name])   Brief Summary: FROM hospital discharge 88 year old female, lives alone, ambulates with a walker x2 weeks, not on home oxygen, medical history significant for HTN, persistent atrial fibrillation on Eliquis, chronic diastolic CHF, GERD, hyperlipidemia presented to the ED due to weakness and falls. Over the last 2 weeks, due to worsening leg edema/tightness and dyspnea, as per her physician instruction she took a couple days of extra doses of Bumex. Admitted for acute on chronic kidney disease, transient hypoxia with associated generalized weakness and falls. Treated with IV fluids for about 24 hours with minimal improvement in creatinine but developed worsening dyspnea due to CHF. IV fluids discontinued. Cardiology and nephrology consulted.  Today she is accompanied by her daughter.  She reports today has been her best day as far as her spirits.  She is ambulating with a four point  walker, alert and oriented.  Reports she has been feeling very tired and low energy but this is improving every  day.    138/80 recheck with PT 118/60   D/C nadolol and ARB continue amiodarone, amlodapin 2.5mg  continue reduced dose apixaban 2.5mg  BID.   Home health is involved.  OT came one day ago and PT has done an evaluation, coming back in two days.  3/18/ Followed up with cardiology, Dr [name].  Continue Lasix 80mg  BID, once daily on the potassium.  She has an appointment with [facility] 02/27/20  Dr. Ubaldo Glassing 4/14 Dermatology  4/20 Dr Melford Aase  Dr Katy Fitch - Eye exam   Images while in the hospital: US RENAL  Result Date: 01/23/2021 CLINICAL DATA:  Acute kidney injury EXAM: RENAL / URINARY TRACT ULTRASOUND COMPLETE COMPARISON:  None. FINDINGS: Right Kidney: Renal measurements: 11.2 x 4.8 x 6.1 cm = volume: 174 mL. Echogenicity within normal limits. Multiple benign exophytic cysts. No mass or hydronephrosis visualized. Left Kidney: Renal measurements: 9.9 x 5.8 x 6.4 cm = volume: 190 mL. Echogenicity within normal limits. Multiple benign exophytic cysts. No mass or hydronephrosis visualized. Bladder: Appears normal for degree of bladder distention. Other: None. IMPRESSION: 1.  No significant abnormality identified. No hydronephrosis. 2. Multiple bilateral benign exophytic renal cysts, definitively benign, for which no further follow-up or characterization is required. Electronically Signed   By: [name] M.D.   On: 01/23/2021 10:20   NM Pulmonary Perf and Vent  Result Date: 01/23/2021 CLINICAL DATA:  Dyspnea for several months with lytic sources. EXAM: NUCLEAR MEDICINE PERFUSION LUNG SCAN TECHNIQUE: Perfusion images were obtained in multiple projections after intravenous injection of radiopharmaceutical. Ventilation scans intentionally deferred if perfusion scan and chest x-ray adequate for interpretation during COVID 19 epidemic. RADIOPHARMACEUTICALS:  4.4 mCi Tc-50m MAA IV COMPARISON:  Chest radiograph from one day prior. FINDINGS: No significant pulmonary perfusion defects. Inferior midline and  medial left lower chest defect correlates with mild cardiomegaly as seen on chest radiograph. IMPRESSION: No evidence of pulmonary embolism on perfusion only scan. Electronically Signed   By: Ilona Sorrel M.D.   On: 01/23/2021 12:13   DG Chest Port 1 View  Result Date: 01/22/2021 CLINICAL DATA:  Weakness and shortness of breath, recent fall EXAM: PORTABLE CHEST 1 VIEW COMPARISON:  09/26/2019 FINDINGS: Cardiac shadow is enlarged but stable. Aortic calcifications are seen. Lungs are well aerated bilaterally. No focal infiltrate or sizable effusion is seen. Calcified granuloma in the right base is noted. No acute bony abnormality is noted. IMPRESSION: No acute abnormality noted. Electronically Signed   By: [name] M.D.   On: 01/22/2021 18:50      Current Outpatient Medications (Cardiovascular):  .  amiodarone (PACERONE) 200 MG tablet, Take 1 tablet (200 mg total) by mouth daily. Marland Kitchen  amLODipine (NORVASC) 2.5 MG tablet, Take 1 tablet (2.5 mg total) by mouth daily. .  furosemide (LASIX) 80 MG tablet, Take 1 tablet (80 mg total) by mouth 2 (two) times daily.  Current Outpatient Medications (Respiratory):  .  PROAIR HFA 108 (90 Base) MCG/ACT inhaler, Inhale 2 puffs into the lungs every 6 (six) hours as needed for wheezing or shortness of breath.  Current Outpatient Medications (Analgesics):  .  acetaminophen (TYLENOL) 500 MG tablet, Take 500-1,000 mg by mouth every 6 (six) hours as needed for moderate pain.  Current Outpatient Medications (Hematological):  .  apixaban (ELIQUIS) 2.5 MG TABS tablet, Take 1 tablet (2.5 mg total) by mouth 2 (two) times daily.  Current Outpatient Medications (Other):  Marland Kitchen  Ascorbic Acid (VITAMIN C) 1000 MG tablet, Take 1,000 mg by mouth daily.  .  Carboxymeth-Glycerin-Polysorb (REFRESH DIGITAL OP), Place 1 drop into the right eye daily as needed (dry eyes). .  cholecalciferol (VITAMIN D3) 25 MCG (1000 UT) tablet, Take 1,000 Units by mouth 2 (two) times daily. .  Misc  Natural Products (OSTEO BI-FLEX JOINT SHIELD PO), Take 1 tablet by mouth 2 (two) times daily.  .  Multiple Vitamins-Minerals (PRESERVISION AREDS 2 PO), Take 1 capsule by mouth 2 (two) times daily. .  Olopatadine HCl (PATADAY OP), Place 1 drop into the left eye daily. .  potassium chloride SA (KLOR-CON) 20 MEQ tablet, Take 1 tablet (20 mEq total) by mouth 2 (two) times daily. .  Pyridoxine HCl (VITAMIN B-6 PO), Take 1 capsule by mouth daily. Marland Kitchen  zinc gluconate 50 MG tablet, Take 50 mg by mouth daily.  Past Medical History:  Diagnosis Date  . Acute on chronic diastolic (congestive) heart failure ([place]) 11/21/2018  . Cancer ([place])    skin cancer  . DDD (degenerative disc disease)   . Diverticula of colon   . Diverticulitis   . DJD (degenerative joint disease)   . GERD (gastroesophageal reflux disease)    takes ranitidine  . Heart murmur   . History of hiatal hernia   . History of pneumonia   . Hx of irritable bowel syndrome   . Hyperlipidemia   . Occasional tremors   . Osteoporosis   . Pre-diabetes   . Varicose veins   . Vitamin D deficiency      Allergies  Allergen Reactions  . Accupril [Quinapril Hcl] Cough  . Neosporin [Neomycin-Bacitracin Zn-Polymyx]     unknown  . Prednisone     Agitated and shaky  . Reglan [Metoclopramide] Other (See Comments)    tremors  . Tramadol Nausea And Vomiting  . Adhesive [Tape] Rash    ROS: all negative except above.   Physical Exam: Filed Weights   02/05/21 1611  Weight: 146 lb (66.2 kg)   BP 130/82   Pulse 63   Temp (!) 97.5 F (36.4 C)   Wt 146 lb (66.2 kg)   SpO2 96%   BMI 27.59 kg/m  General Appearance: Well nourished, in no apparent distress. Eyes: PERRLA, EOMs, conjunctiva no swelling or erythema Sinuses: No Frontal/maxillary tenderness ENT/Mouth: Ext aud canals clear, TMs without erythema, bulging. No erythema, swelling, or exudate on post pharynx.  Tonsils not swollen or erythematous. Hearing normal.  Neck: Supple,  thyroid normal.  Respiratory: Respiratory effort normal, BS equal bilaterally without rales, rhonchi, wheezing or stridor.  Cardio: RRR with no MRGs. Brisk peripheral pulses without edema.  Abdomen: Soft, + BS.  Non tender, no guarding, rebound, hernias, masses. Lymphatics: Non tender without lymphadenopathy.  Musculoskeletal: Full ROM, 5/5 strength, normal gait.  Skin: Warm, dry without rashes, lesions, ecchymosis.  Neuro: Cranial nerves intact. Normal muscle tone, no cerebellar symptoms. Sensation intact.  Psych: Awake and oriented X 3, normal affect, Insight and Judgment appropriate.       Briana [name], Laqueta Jean, DNP [facility] 02/05/2021  4:25 PM

## 2021-02-06 DIAGNOSIS — E785 Hyperlipidemia, unspecified: Secondary | ICD-10-CM | POA: Diagnosis not present

## 2021-02-06 DIAGNOSIS — I130 Hypertensive heart and chronic kidney disease with heart failure and stage 1 through stage 4 chronic kidney disease, or unspecified chronic kidney disease: Secondary | ICD-10-CM | POA: Diagnosis not present

## 2021-02-06 DIAGNOSIS — E1122 Type 2 diabetes mellitus with diabetic chronic kidney disease: Secondary | ICD-10-CM | POA: Diagnosis not present

## 2021-02-06 DIAGNOSIS — I2510 Atherosclerotic heart disease of native coronary artery without angina pectoris: Secondary | ICD-10-CM | POA: Diagnosis not present

## 2021-02-06 DIAGNOSIS — Z7901 Long term (current) use of anticoagulants: Secondary | ICD-10-CM | POA: Diagnosis not present

## 2021-02-06 DIAGNOSIS — M519 Unspecified thoracic, thoracolumbar and lumbosacral intervertebral disc disorder: Secondary | ICD-10-CM | POA: Diagnosis not present

## 2021-02-06 DIAGNOSIS — E1165 Type 2 diabetes mellitus with hyperglycemia: Secondary | ICD-10-CM | POA: Diagnosis not present

## 2021-02-06 DIAGNOSIS — D631 Anemia in chronic kidney disease: Secondary | ICD-10-CM | POA: Diagnosis not present

## 2021-02-06 DIAGNOSIS — Z9181 History of falling: Secondary | ICD-10-CM | POA: Diagnosis not present

## 2021-02-06 DIAGNOSIS — E559 Vitamin D deficiency, unspecified: Secondary | ICD-10-CM | POA: Diagnosis not present

## 2021-02-06 DIAGNOSIS — I5032 Chronic diastolic (congestive) heart failure: Secondary | ICD-10-CM | POA: Diagnosis not present

## 2021-02-06 DIAGNOSIS — N1831 Chronic kidney disease, stage 3a: Secondary | ICD-10-CM | POA: Diagnosis not present

## 2021-02-06 DIAGNOSIS — M150 Primary generalized (osteo)arthritis: Secondary | ICD-10-CM | POA: Diagnosis not present

## 2021-02-06 DIAGNOSIS — I4891 Unspecified atrial fibrillation: Secondary | ICD-10-CM | POA: Diagnosis not present

## 2021-02-06 NOTE — Telephone Encounter (Signed)
I spoke with the patient's daughter, her weight does fluctuate up and down however is fairly stable somewhere between 142 to 146 pounds.  Her weight auto-correct itself without any sign of worsening shortness of breath or weakness.  I recommended continue on the current therapy.  If her weight continue to go up or go down on several we will check, daughter has been instructed to contact us.

## 2021-02-07 ENCOUNTER — Other Ambulatory Visit: Payer: Self-pay | Admitting: Adult Health Nurse Practitioner

## 2021-02-07 DIAGNOSIS — I5032 Chronic diastolic (congestive) heart failure: Secondary | ICD-10-CM

## 2021-02-12 DIAGNOSIS — I130 Hypertensive heart and chronic kidney disease with heart failure and stage 1 through stage 4 chronic kidney disease, or unspecified chronic kidney disease: Secondary | ICD-10-CM | POA: Diagnosis not present

## 2021-02-12 DIAGNOSIS — D631 Anemia in chronic kidney disease: Secondary | ICD-10-CM | POA: Diagnosis not present

## 2021-02-12 DIAGNOSIS — I4891 Unspecified atrial fibrillation: Secondary | ICD-10-CM | POA: Diagnosis not present

## 2021-02-12 DIAGNOSIS — E559 Vitamin D deficiency, unspecified: Secondary | ICD-10-CM | POA: Diagnosis not present

## 2021-02-12 DIAGNOSIS — Z9181 History of falling: Secondary | ICD-10-CM | POA: Diagnosis not present

## 2021-02-12 DIAGNOSIS — I5032 Chronic diastolic (congestive) heart failure: Secondary | ICD-10-CM | POA: Diagnosis not present

## 2021-02-12 DIAGNOSIS — N1831 Chronic kidney disease, stage 3a: Secondary | ICD-10-CM | POA: Diagnosis not present

## 2021-02-12 DIAGNOSIS — E785 Hyperlipidemia, unspecified: Secondary | ICD-10-CM | POA: Diagnosis not present

## 2021-02-12 DIAGNOSIS — M519 Unspecified thoracic, thoracolumbar and lumbosacral intervertebral disc disorder: Secondary | ICD-10-CM | POA: Diagnosis not present

## 2021-02-12 DIAGNOSIS — E1165 Type 2 diabetes mellitus with hyperglycemia: Secondary | ICD-10-CM | POA: Diagnosis not present

## 2021-02-12 DIAGNOSIS — M150 Primary generalized (osteo)arthritis: Secondary | ICD-10-CM | POA: Diagnosis not present

## 2021-02-12 DIAGNOSIS — Z7901 Long term (current) use of anticoagulants: Secondary | ICD-10-CM | POA: Diagnosis not present

## 2021-02-12 DIAGNOSIS — E1122 Type 2 diabetes mellitus with diabetic chronic kidney disease: Secondary | ICD-10-CM | POA: Diagnosis not present

## 2021-02-12 DIAGNOSIS — I2510 Atherosclerotic heart disease of native coronary artery without angina pectoris: Secondary | ICD-10-CM | POA: Diagnosis not present

## 2021-02-17 DIAGNOSIS — Z9181 History of falling: Secondary | ICD-10-CM | POA: Diagnosis not present

## 2021-02-17 DIAGNOSIS — I4891 Unspecified atrial fibrillation: Secondary | ICD-10-CM | POA: Diagnosis not present

## 2021-02-17 DIAGNOSIS — D631 Anemia in chronic kidney disease: Secondary | ICD-10-CM | POA: Diagnosis not present

## 2021-02-17 DIAGNOSIS — Z7901 Long term (current) use of anticoagulants: Secondary | ICD-10-CM | POA: Diagnosis not present

## 2021-02-17 DIAGNOSIS — I2510 Atherosclerotic heart disease of native coronary artery without angina pectoris: Secondary | ICD-10-CM | POA: Diagnosis not present

## 2021-02-17 DIAGNOSIS — E785 Hyperlipidemia, unspecified: Secondary | ICD-10-CM | POA: Diagnosis not present

## 2021-02-17 DIAGNOSIS — M150 Primary generalized (osteo)arthritis: Secondary | ICD-10-CM | POA: Diagnosis not present

## 2021-02-17 DIAGNOSIS — E559 Vitamin D deficiency, unspecified: Secondary | ICD-10-CM | POA: Diagnosis not present

## 2021-02-17 DIAGNOSIS — M519 Unspecified thoracic, thoracolumbar and lumbosacral intervertebral disc disorder: Secondary | ICD-10-CM | POA: Diagnosis not present

## 2021-02-17 DIAGNOSIS — N1831 Chronic kidney disease, stage 3a: Secondary | ICD-10-CM | POA: Diagnosis not present

## 2021-02-17 DIAGNOSIS — E1165 Type 2 diabetes mellitus with hyperglycemia: Secondary | ICD-10-CM | POA: Diagnosis not present

## 2021-02-17 DIAGNOSIS — I130 Hypertensive heart and chronic kidney disease with heart failure and stage 1 through stage 4 chronic kidney disease, or unspecified chronic kidney disease: Secondary | ICD-10-CM | POA: Diagnosis not present

## 2021-02-17 DIAGNOSIS — E1122 Type 2 diabetes mellitus with diabetic chronic kidney disease: Secondary | ICD-10-CM | POA: Diagnosis not present

## 2021-02-17 DIAGNOSIS — I5032 Chronic diastolic (congestive) heart failure: Secondary | ICD-10-CM | POA: Diagnosis not present

## 2021-02-19 ENCOUNTER — Telehealth: Payer: Self-pay | Admitting: Cardiovascular Disease

## 2021-02-19 NOTE — Telephone Encounter (Signed)
Pt c/o medication issue:  1. Name of Medication: amLODipine (NORVASC) 2.5 MG tablet  2. How are you currently taking this medication (dosage and times per day)? Take 1 tablet (2.5 mg total) by mouth daily.  3. Are you having a reaction (difficulty breathing--STAT)? No  4. What is your medication issue? Meds was prescribed by [facility] Physician Modena Jansky, MD, daughter wants to know if pt still needs to be taking this medicine and if so can we please send a script refill to [place] ([place]), [place] [address]* If patient is at the pharmacy, call can be transferred to refill team.   1. Which medications need to be refilled? (please list name of each medication and dose if known)  amLODipine (NORVASC) 2.5 MG tablet  2. Which pharmacy/location (including street and city if local pharmacy) is medication to be sent to? [place] ([place]), [place] - [address]  3. Do they need a 30 day or 90 day supply? Pt has 6 pills left and she has an upcoming appt in 3 weeks so she needs just enough to get her through to her appt date

## 2021-02-21 ENCOUNTER — Other Ambulatory Visit: Payer: Self-pay

## 2021-02-21 DIAGNOSIS — I2510 Atherosclerotic heart disease of native coronary artery without angina pectoris: Secondary | ICD-10-CM | POA: Diagnosis not present

## 2021-02-21 DIAGNOSIS — I5032 Chronic diastolic (congestive) heart failure: Secondary | ICD-10-CM

## 2021-02-21 DIAGNOSIS — E785 Hyperlipidemia, unspecified: Secondary | ICD-10-CM | POA: Diagnosis not present

## 2021-02-21 DIAGNOSIS — N1831 Chronic kidney disease, stage 3a: Secondary | ICD-10-CM | POA: Diagnosis not present

## 2021-02-21 DIAGNOSIS — Z9181 History of falling: Secondary | ICD-10-CM | POA: Diagnosis not present

## 2021-02-21 DIAGNOSIS — M150 Primary generalized (osteo)arthritis: Secondary | ICD-10-CM | POA: Diagnosis not present

## 2021-02-21 DIAGNOSIS — I10 Essential (primary) hypertension: Secondary | ICD-10-CM

## 2021-02-21 DIAGNOSIS — E1122 Type 2 diabetes mellitus with diabetic chronic kidney disease: Secondary | ICD-10-CM | POA: Diagnosis not present

## 2021-02-21 DIAGNOSIS — I4891 Unspecified atrial fibrillation: Secondary | ICD-10-CM | POA: Diagnosis not present

## 2021-02-21 DIAGNOSIS — M519 Unspecified thoracic, thoracolumbar and lumbosacral intervertebral disc disorder: Secondary | ICD-10-CM | POA: Diagnosis not present

## 2021-02-21 DIAGNOSIS — I130 Hypertensive heart and chronic kidney disease with heart failure and stage 1 through stage 4 chronic kidney disease, or unspecified chronic kidney disease: Secondary | ICD-10-CM | POA: Diagnosis not present

## 2021-02-21 DIAGNOSIS — I480 Paroxysmal atrial fibrillation: Secondary | ICD-10-CM

## 2021-02-21 DIAGNOSIS — E1165 Type 2 diabetes mellitus with hyperglycemia: Secondary | ICD-10-CM | POA: Diagnosis not present

## 2021-02-21 DIAGNOSIS — Z7901 Long term (current) use of anticoagulants: Secondary | ICD-10-CM | POA: Diagnosis not present

## 2021-02-21 DIAGNOSIS — R001 Bradycardia, unspecified: Secondary | ICD-10-CM

## 2021-02-21 DIAGNOSIS — E559 Vitamin D deficiency, unspecified: Secondary | ICD-10-CM | POA: Diagnosis not present

## 2021-02-21 DIAGNOSIS — D631 Anemia in chronic kidney disease: Secondary | ICD-10-CM | POA: Diagnosis not present

## 2021-02-21 MED ORDER — AMLODIPINE BESYLATE 2.5 MG PO TABS: 2.5000 mg | ORAL_TABLET | Freq: Every day | ORAL | 0 refills | Status: DC

## 2021-02-22 ENCOUNTER — Other Ambulatory Visit: Payer: Self-pay | Admitting: Internal Medicine

## 2021-02-22 DIAGNOSIS — I10 Essential (primary) hypertension: Secondary | ICD-10-CM

## 2021-02-23 ENCOUNTER — Other Ambulatory Visit: Payer: Self-pay | Admitting: Cardiovascular Disease

## 2021-02-25 ENCOUNTER — Telehealth: Payer: Self-pay | Admitting: Cardiovascular Disease

## 2021-02-25 DIAGNOSIS — D631 Anemia in chronic kidney disease: Secondary | ICD-10-CM | POA: Diagnosis not present

## 2021-02-25 DIAGNOSIS — I4891 Unspecified atrial fibrillation: Secondary | ICD-10-CM | POA: Diagnosis not present

## 2021-02-25 DIAGNOSIS — I2510 Atherosclerotic heart disease of native coronary artery without angina pectoris: Secondary | ICD-10-CM | POA: Diagnosis not present

## 2021-02-25 DIAGNOSIS — E1122 Type 2 diabetes mellitus with diabetic chronic kidney disease: Secondary | ICD-10-CM | POA: Diagnosis not present

## 2021-02-25 DIAGNOSIS — M519 Unspecified thoracic, thoracolumbar and lumbosacral intervertebral disc disorder: Secondary | ICD-10-CM | POA: Diagnosis not present

## 2021-02-25 DIAGNOSIS — I130 Hypertensive heart and chronic kidney disease with heart failure and stage 1 through stage 4 chronic kidney disease, or unspecified chronic kidney disease: Secondary | ICD-10-CM | POA: Diagnosis not present

## 2021-02-25 DIAGNOSIS — Z9181 History of falling: Secondary | ICD-10-CM | POA: Diagnosis not present

## 2021-02-25 DIAGNOSIS — N1831 Chronic kidney disease, stage 3a: Secondary | ICD-10-CM | POA: Diagnosis not present

## 2021-02-25 DIAGNOSIS — E785 Hyperlipidemia, unspecified: Secondary | ICD-10-CM | POA: Diagnosis not present

## 2021-02-25 DIAGNOSIS — E559 Vitamin D deficiency, unspecified: Secondary | ICD-10-CM | POA: Diagnosis not present

## 2021-02-25 DIAGNOSIS — M150 Primary generalized (osteo)arthritis: Secondary | ICD-10-CM | POA: Diagnosis not present

## 2021-02-25 DIAGNOSIS — Z7901 Long term (current) use of anticoagulants: Secondary | ICD-10-CM | POA: Diagnosis not present

## 2021-02-25 DIAGNOSIS — I5032 Chronic diastolic (congestive) heart failure: Secondary | ICD-10-CM | POA: Diagnosis not present

## 2021-02-25 DIAGNOSIS — E1165 Type 2 diabetes mellitus with hyperglycemia: Secondary | ICD-10-CM | POA: Diagnosis not present

## 2021-02-25 MED ORDER — FUROSEMIDE 80 MG PO TABS: 80.0000 mg | ORAL_TABLET | Freq: Two times a day (BID) | ORAL | 2 refills | Status: DC

## 2021-02-25 NOTE — Telephone Encounter (Signed)
*  STAT* If patient is at the pharmacy, call can be transferred to refill team.   1. Which medications need to be refilled? (please list name of each medication and dose if known) amiodarone (PACERONE) 200 MG tablet furosemide (LASIX) 80 MG tablet  2. Which pharmacy/location (including street and city if local pharmacy) is medication to be sent to? [place] ([place]), [place] - [address]  3. Do they need a 30 day or 90 day supply? [place]

## 2021-02-26 DIAGNOSIS — I129 Hypertensive chronic kidney disease with stage 1 through stage 4 chronic kidney disease, or unspecified chronic kidney disease: Secondary | ICD-10-CM | POA: Diagnosis not present

## 2021-02-26 DIAGNOSIS — E1122 Type 2 diabetes mellitus with diabetic chronic kidney disease: Secondary | ICD-10-CM | POA: Diagnosis not present

## 2021-02-26 DIAGNOSIS — I5032 Chronic diastolic (congestive) heart failure: Secondary | ICD-10-CM | POA: Diagnosis not present

## 2021-02-26 DIAGNOSIS — R350 Frequency of micturition: Secondary | ICD-10-CM | POA: Diagnosis not present

## 2021-02-26 DIAGNOSIS — I480 Paroxysmal atrial fibrillation: Secondary | ICD-10-CM | POA: Diagnosis not present

## 2021-02-26 DIAGNOSIS — E876 Hypokalemia: Secondary | ICD-10-CM | POA: Diagnosis not present

## 2021-02-26 DIAGNOSIS — N179 Acute kidney failure, unspecified: Secondary | ICD-10-CM | POA: Diagnosis not present

## 2021-02-27 DIAGNOSIS — Z85820 Personal history of malignant melanoma of skin: Secondary | ICD-10-CM | POA: Diagnosis not present

## 2021-02-27 DIAGNOSIS — L918 Other hypertrophic disorders of the skin: Secondary | ICD-10-CM | POA: Diagnosis not present

## 2021-02-27 DIAGNOSIS — L814 Other melanin hyperpigmentation: Secondary | ICD-10-CM | POA: Diagnosis not present

## 2021-02-27 DIAGNOSIS — L910 Hypertrophic scar: Secondary | ICD-10-CM | POA: Diagnosis not present

## 2021-02-27 DIAGNOSIS — L570 Actinic keratosis: Secondary | ICD-10-CM | POA: Diagnosis not present

## 2021-02-27 DIAGNOSIS — D692 Other nonthrombocytopenic purpura: Secondary | ICD-10-CM | POA: Diagnosis not present

## 2021-02-27 DIAGNOSIS — L821 Other seborrheic keratosis: Secondary | ICD-10-CM | POA: Diagnosis not present

## 2021-02-27 DIAGNOSIS — Z85828 Personal history of other malignant neoplasm of skin: Secondary | ICD-10-CM | POA: Diagnosis not present

## 2021-02-28 DIAGNOSIS — Z7901 Long term (current) use of anticoagulants: Secondary | ICD-10-CM | POA: Diagnosis not present

## 2021-02-28 DIAGNOSIS — D631 Anemia in chronic kidney disease: Secondary | ICD-10-CM | POA: Diagnosis not present

## 2021-02-28 DIAGNOSIS — N1831 Chronic kidney disease, stage 3a: Secondary | ICD-10-CM | POA: Diagnosis not present

## 2021-02-28 DIAGNOSIS — E785 Hyperlipidemia, unspecified: Secondary | ICD-10-CM | POA: Diagnosis not present

## 2021-02-28 DIAGNOSIS — I130 Hypertensive heart and chronic kidney disease with heart failure and stage 1 through stage 4 chronic kidney disease, or unspecified chronic kidney disease: Secondary | ICD-10-CM | POA: Diagnosis not present

## 2021-02-28 DIAGNOSIS — E1122 Type 2 diabetes mellitus with diabetic chronic kidney disease: Secondary | ICD-10-CM | POA: Diagnosis not present

## 2021-02-28 DIAGNOSIS — M150 Primary generalized (osteo)arthritis: Secondary | ICD-10-CM | POA: Diagnosis not present

## 2021-02-28 DIAGNOSIS — I2510 Atherosclerotic heart disease of native coronary artery without angina pectoris: Secondary | ICD-10-CM | POA: Diagnosis not present

## 2021-02-28 DIAGNOSIS — E1165 Type 2 diabetes mellitus with hyperglycemia: Secondary | ICD-10-CM | POA: Diagnosis not present

## 2021-02-28 DIAGNOSIS — Z9181 History of falling: Secondary | ICD-10-CM | POA: Diagnosis not present

## 2021-02-28 DIAGNOSIS — I5032 Chronic diastolic (congestive) heart failure: Secondary | ICD-10-CM | POA: Diagnosis not present

## 2021-02-28 DIAGNOSIS — M519 Unspecified thoracic, thoracolumbar and lumbosacral intervertebral disc disorder: Secondary | ICD-10-CM | POA: Diagnosis not present

## 2021-02-28 DIAGNOSIS — E559 Vitamin D deficiency, unspecified: Secondary | ICD-10-CM | POA: Diagnosis not present

## 2021-02-28 DIAGNOSIS — I4891 Unspecified atrial fibrillation: Secondary | ICD-10-CM | POA: Diagnosis not present

## 2021-03-03 ENCOUNTER — Ambulatory Visit
Admission: RE | Admit: 2021-03-03 | Discharge: 2021-03-03 | Disposition: A | Discharge status: Home / Self Care | Payer: Medicare Other | Source: Ambulatory Visit | Attending: Adult Health Nurse Practitioner | Admitting: Adult Health Nurse Practitioner

## 2021-03-03 ENCOUNTER — Other Ambulatory Visit: Payer: Self-pay | Admitting: Internal Medicine

## 2021-03-03 ENCOUNTER — Ambulatory Visit
Admission: RE | Admit: 2021-03-03 | Discharge: 2021-03-03 | Disposition: A | Discharge status: Home / Self Care | Payer: Medicare Other | Source: Ambulatory Visit | Attending: Internal Medicine | Admitting: Internal Medicine

## 2021-03-03 ENCOUNTER — Other Ambulatory Visit: Payer: Self-pay

## 2021-03-03 DIAGNOSIS — Z780 Asymptomatic menopausal state: Secondary | ICD-10-CM | POA: Diagnosis not present

## 2021-03-03 DIAGNOSIS — Z1231 Encounter for screening mammogram for malignant neoplasm of breast: Secondary | ICD-10-CM

## 2021-03-03 DIAGNOSIS — M8580 Other specified disorders of bone density and structure, unspecified site: Secondary | ICD-10-CM

## 2021-03-03 DIAGNOSIS — M85851 Other specified disorders of bone density and structure, right thigh: Secondary | ICD-10-CM | POA: Diagnosis not present

## 2021-03-04 ENCOUNTER — Encounter: Payer: Self-pay | Admitting: Internal Medicine

## 2021-03-04 DIAGNOSIS — E785 Hyperlipidemia, unspecified: Secondary | ICD-10-CM | POA: Diagnosis not present

## 2021-03-04 DIAGNOSIS — M810 Age-related osteoporosis without current pathological fracture: Secondary | ICD-10-CM | POA: Diagnosis not present

## 2021-03-04 DIAGNOSIS — Z7901 Long term (current) use of anticoagulants: Secondary | ICD-10-CM | POA: Diagnosis not present

## 2021-03-04 DIAGNOSIS — E1165 Type 2 diabetes mellitus with hyperglycemia: Secondary | ICD-10-CM | POA: Diagnosis not present

## 2021-03-04 DIAGNOSIS — K5730 Diverticulosis of large intestine without perforation or abscess without bleeding: Secondary | ICD-10-CM | POA: Diagnosis not present

## 2021-03-04 DIAGNOSIS — M150 Primary generalized (osteo)arthritis: Secondary | ICD-10-CM | POA: Diagnosis not present

## 2021-03-04 DIAGNOSIS — M519 Unspecified thoracic, thoracolumbar and lumbosacral intervertebral disc disorder: Secondary | ICD-10-CM | POA: Diagnosis not present

## 2021-03-04 DIAGNOSIS — D62 Acute posthemorrhagic anemia: Secondary | ICD-10-CM | POA: Diagnosis not present

## 2021-03-04 DIAGNOSIS — Z9181 History of falling: Secondary | ICD-10-CM | POA: Diagnosis not present

## 2021-03-04 DIAGNOSIS — D631 Anemia in chronic kidney disease: Secondary | ICD-10-CM | POA: Diagnosis not present

## 2021-03-04 DIAGNOSIS — I4891 Unspecified atrial fibrillation: Secondary | ICD-10-CM | POA: Diagnosis not present

## 2021-03-04 DIAGNOSIS — E1122 Type 2 diabetes mellitus with diabetic chronic kidney disease: Secondary | ICD-10-CM | POA: Diagnosis not present

## 2021-03-04 DIAGNOSIS — I130 Hypertensive heart and chronic kidney disease with heart failure and stage 1 through stage 4 chronic kidney disease, or unspecified chronic kidney disease: Secondary | ICD-10-CM | POA: Diagnosis not present

## 2021-03-04 DIAGNOSIS — N1831 Chronic kidney disease, stage 3a: Secondary | ICD-10-CM | POA: Diagnosis not present

## 2021-03-04 DIAGNOSIS — I2510 Atherosclerotic heart disease of native coronary artery without angina pectoris: Secondary | ICD-10-CM | POA: Diagnosis not present

## 2021-03-04 DIAGNOSIS — I5032 Chronic diastolic (congestive) heart failure: Secondary | ICD-10-CM | POA: Diagnosis not present

## 2021-03-04 DIAGNOSIS — N390 Urinary tract infection, site not specified: Secondary | ICD-10-CM | POA: Diagnosis not present

## 2021-03-04 NOTE — Patient Instructions (Signed)

## 2021-03-04 NOTE — Progress Notes (Signed)
Annual Screening/Preventative Visit & Comprehensive Evaluation &  Examination  Future Appointments  Date Time Provider [place]  03/05/2021  3:00 PM Unk Pinto, MD [facility] None  03/07/2021  2:45 PM Lendon Colonel, NP [facility]  03/09/2022  3:00 PM Unk Pinto, MD [facility] None        This very nice 87 y.o. [facility] presents for a Screening /Preventative Visit & comprehensive evaluation and management of multiple medical co-morbidities.  Patient has been followed for HTN, cAfib on Eliquis, HLD, T2_NIDDM  and Vitamin D Deficiency. Chest CTA on 11/21/2018 showed Aortic Atherosclerosis.       Patient was recently hospitalized 03/09-03/15/2022 with AKI  On CKD attributed to taking extra Bumex for worsening LE edema. IVF rescue exacerbated CHF and IVF d/c'd.  Cardiology & Nephrology consultants changed and adjusted dosing of her meds.  Nadolol & ARB were d/c'd & Amiodarone and Amlodipine continued and Eliquis was tapered. She was d/c'd on Lasix 80 mg bid & KCL qd/. U/C was (+) and she was treated for E.coli  UTI.       HTN predates since 1996. Patient was dx'd with Afib (and started on Eliquis)  and also chronic Diastolic HF in Jan 1610  & had 1st CV Feb 2020  and more recently had a 2sd failed CV for persistent Afib in July 2020 by Dr Dorris Carnes.  Patient denies any cardiac symptoms as chest pain, palpitations, shortness of breath, dizziness , but does have ankle swelling. Today's BP is at goal - 140/68.       Patient's hyperlipidemia is not controlled with diet and she been strongly encourage better diet.. Patient denies myalgias or other medication SE's. Last lipids were not at goal:  Lab Results  Component Value Date   CHOL 202 (H) 02/28/2020   HDL 67 02/28/2020   LDLCALC 119 (H) 02/28/2020   TRIG 67 02/28/2020   CHOLHDL 3.0 02/28/2020        Patient has hx/o T2_NIDDM (A1c 6.6% /2011) w/ CKD 4 (GFR  27)  and patient denies reactive hypoglycemic  symptoms, visual blurring, diabetic polys or paresthesias. Last A1c was not at goal & she was encouraged weight los (has lost 6 # over the last month):  Lab Results  Component Value Date   HGBA1C 6.8 (H) 01/24/2021   Wt Readings from Last 3 Encounters:  03/05/21 143 lb 9.6 oz (65.1 kg)  02/05/21 146 lb (66.2 kg)  01/31/21 149 lb 12.8 oz (67.9 kg)        Finally, patient has history of Vitamin D Deficiency (23" / 2008) and last Vitamin D was low:  Lab Results  Component Value Date   VD25OH 38 02/28/2020    Current Outpatient Medications on File Prior to Visit  Medication Sig  . acetaminophen  500 MG Take 500 to 1000 mg every 6 hours as needed.  Marland Kitchen amiodarone  200 MG  Take 1 tabletonce daily  . amLODipine  2.5 MG Take 1 tablet  daily.  .  Take 1 tablet  2  times daily.  .  Take 1,000 mg  daily.   Marland Kitchen REFRESH DIGITAL OP Place 1 drop into the right eye daily as needed  . VITAMIN D 1000 u Take 1,000 Units  2 (two) times daily.  . Cyanocobalamin (B-12 SL) Place 1 tablet under the tongue daily.  . furosemide  80 MG  Take 1 tablet   2 times daily.  Jefm Miles BI-FLEX JOINT SHIELD Take  1 tablet  2  times daily.   Marland Kitchen PRESERVISION AREDS 2 Take 1 capsule 2  times daily.  . (PATADAY  Place 1 drop into the left eye daily.  . 20 MEQ tablet Take 1 tablet  2  times daily.  Marland Kitchen PROAIR HFA 108inhaler Inhale 2 puffs  every 6  hours as needed   . Pyridoxine /Vitamin  B-6) Take 1 capsuledaily.  Marland Kitchen zinc  50 MG tablet Take  daily.     Allergies  Allergen Reactions  . Accupril [Quinapril Hcl] Cough  . Neosporin [Neomycin-Bacitracin Zn-Polymyx]     unknown  . Prednisone     Agitated and shaky  . Reglan [Metoclopramide] Other (See Comments)    tremors  . Tramadol Nausea And Vomiting  . Adhesive [Tape] Rash    Past Medical History:  Diagnosis Date  . Acute on chronic diastolic (congestive) heart failure ([place]) 11/21/2018  . Cancer ([place])    skin cancer  . DDD (degenerative disc disease)   .  Diverticula of colon   . Diverticulitis   . DJD (degenerative joint disease)   . GERD (gastroesophageal reflux disease)    takes ranitidine  . Heart murmur   . History of hiatal hernia   . History of pneumonia   . Hx of irritable bowel syndrome   . Hyperlipidemia   . Occasional tremors   . Osteoporosis   . Pre-diabetes   . Varicose veins   . Vitamin D deficiency     Health Maintenance  Topic Date Due  . COVID-19 Vaccine (1) Never done  . FOOT EXAM  02/27/2021  . URINE MICROALBUMIN  02/27/2021  . INFLUENZA VACCINE  06/16/2021  . HEMOGLOBIN A1C  07/27/2021  . OPHTHALMOLOGY EXAM  01/09/2022  . TETANUS/TDAP  09/04/2025  . DEXA SCAN  Completed  . PNA vac Low Risk Adult  Completed  . HPV VACCINES  Aged Out    Immunization History  Administered Date(s) Administered  . DT (Pediatric) 09/05/2015  . DTaP 11/16/2004  . Influenza Whole 08/30/2013  . Influenza, High Dose Seasonal PF 08/28/2015, 08/10/2016, 11/20/2019, 09/10/2020  . Pneumococcal Conjugate-13 10/19/2016  . Pneumococcal Polysaccharide-23 11/16/2001  . Zoster 06/01/2013    Last Colon - 01/21/2010 - Dr Fuller Plan - recc 10 yr f/uMar 2022 - Aged out  Last MGM - 03/03/21  Past Surgical History:  Procedure Laterality Date  . APPENDECTOMY    . BREAST EXCISIONAL BIOPSY Right   . BREAST SURGERY Right    fluid drained from breast  . CARDIOVERSION N/A 12/20/2018   Procedure: CARDIOVERSION;  Surgeon: Buford Dresser, MD;  Location[facility];  Service: Cardiovascular;  Laterality: N/A;  . CARDIOVERSION N/A 06/16/2019   Procedure: CARDIOVERSION;  Surgeon: Fay Records, MD;  Location: [place];  Service: Cardiovascular;  Laterality: N/A;  . COLONOSCOPY    . ESOPHAGOGASTRODUODENOSCOPY    . JOINT REPLACEMENT Left    left hip  . LUMBAR LAMINECTOMY/DECOMPRESSION MICRODISCECTOMY N/A 06/05/2015   Procedure: L3-L5 DECOMPRESSION ;  Surgeon: Melina Schools, MD;  Location: [place];  Service: Orthopedics;  Laterality: N/A;   . OPEN REDUCTION INTERNAL FIXATION (ORIF) DISTAL RADIAL FRACTURE Right 01/10/2014   Procedure: OPEN REDUCTION INTERNAL FIXATION (ORIF) DISTAL RADIAL FRACTURE;  Surgeon: Linna Hoff, MD;  Location: [place];  Service: Orthopedics;  Laterality: Right;  . SPINAL CORD DECOMPRESSION  06/05/2015   L3 L 5  . THYROID SURGERY    . TONSILLECTOMY    . TUBAL LIGATION    .  varicose veins stripped      Family History  Problem Relation Age of Onset  . Hypertension Mother   . Hyperlipidemia Mother   . Heart disease Sister   . Cancer Brother        prostate    Social History   Tobacco Use  . Smoking status: Never Smoker  . Smokeless tobacco: Never Used  Substance Use Topics  . Alcohol use: No  . Drug use: No    ROS Constitutional: Denies fever, chills, weight loss/gain, headaches, insomnia,  night sweats, and change in appetite. Does c/o fatigue. Eyes: Denies redness, blurred vision, diplopia, discharge, itchy, watery eyes.  ENT: Denies discharge, congestion, post nasal drip, epistaxis, sore throat, earache, hearing loss, dental pain, Tinnitus, Vertigo, Sinus pain, snoring.  Cardio: Denies chest pain, palpitations, irregular heartbeat, syncope, dyspnea, diaphoresis, orthopnea, PND, claudication, edema Respiratory: denies cough, dyspnea, DOE, pleurisy, hoarseness, laryngitis, wheezing.  Gastrointestinal: Denies dysphagia, heartburn, reflux, water brash, pain, cramps, nausea, vomiting, bloating, diarrhea, constipation, hematemesis, melena, hematochezia, jaundice, hemorrhoids Genitourinary: Denies dysuria, frequency, urgency, nocturia, hesitancy, discharge, hematuria, flank pain Breast: Breast lumps, nipple discharge, bleeding.  Musculoskeletal: Denies arthralgia, myalgia, stiffness, Jt. Swelling, pain, limp, and strain/sprain. Denies falls. Skin: Denies puritis, rash, hives, warts, acne, eczema, changing in skin lesion Neuro: No weakness, tremor, incoordination, spasms, paresthesia,  pain Psychiatric: Denies confusion, memory loss, sensory loss. Denies Depression. Endocrine: Denies change in weight, skin, hair change, nocturia, and paresthesia, diabetic polys, visual blurring, hyper / hypo glycemic episodes.  Heme/Lymph: No excessive bleeding, bruising, enlarged lymph nodes.  Physical Exam  BP 140/68   Pulse 61   Temp (!) 97.3 F (36.3 C)   Resp 16   Ht 5\' 1"  (1.549 m)   Wt 143 lb 9.6 oz (65.1 kg)   SpO2 95%   BMI 27.13 kg/m   General Appearance: Well nourished, well groomed and in no apparent distress.  Eyes: PERRLA, EOMs, conjunctiva no swelling or erythema, normal fundi and vessels. Sinuses: No frontal/maxillary tenderness ENT/Mouth: EACs patent / TMs  nl. Nares clear without erythema, swelling, mucoid exudates. Oral hygiene is good. No erythema, swelling, or exudate. Tongue normal, non-obstructing. Tonsils not swollen or erythematous. Hearing normal.  Neck: Supple, thyroid not palpable. No bruits, nodes or JVD. Respiratory: Respiratory effort normal.  BS equal and clear bilateral without rales, rhonci, wheezing or stridor. Cardio: Heart sounds are normal with regular rate and rhythm and no murmurs, rubs or gallops. Peripheral pulses are normal and equal bilaterally without edema. No aortic or femoral bruits. Chest: symmetric with normal excursions and percussion. Breasts: Symmetric, without lumps, nipple discharge, retractions, or fibrocystic changes.  Abdomen: Flat, soft with bowel sounds active. Nontender, no guarding, rebound, hernias, masses, or organomegaly.  Lymphatics: Non tender without lymphadenopathy.  Genitourinary:  Musculoskeletal: Full ROM all peripheral extremities, joint stability, 5/5 strength, and normal gait. Skin: Warm and dry without rashes, lesions, cyanosis, clubbing or  ecchymosis.  Neuro: Cranial nerves intact, reflexes equal bilaterally. Normal muscle tone, no cerebellar symptoms. Sensation intact. Fine high frequency low amplitude  tremor of hands. Pysch: Alert and oriented X 3, normal affect, Insight and Judgment appropriate.   Assessment and Plan  1. Annual Preventative Screening Examination   2. Essential hypertension  - EKG 12-Lead - Urinalysis, Routine w reflex microscopic - Urine Culture - Microalbumin / creatinine urine ratio  3. Hyperlipidemia associated with type 2 diabetes mellitus (HCC)  - EKG 12-Lead - Lipid panel  4. Type 2 diabetes mellitus with stage 4 chronic kidney  disease, without long-term current use of insulin (HCC)  - EKG 12-Lead - Urinalysis, Routine w reflex microscopic - Microalbumin / creatinine urine ratio - HM DIABETES FOOT EXAM - LOW EXTREMITY NEUR EXAM DOCUM - PTH, intact and calcium  5. Vitamin D deficiency  - VITAMIN D 25 Hydroxy  6. Chronic atrial fibrillation (HCC)  - EKG 12-Lead - TSH  7. Chronic diastolic CHF (congestive heart failure) (HCC)  - EKG 12-Lead - Lipid panel  8. Acquired thrombophilia ([place])  - CBC with Differential/Platelet  9. AKI (acute kidney injury) ([place])  - Urinalysis, Routine w reflex microscopic - Microalbumin / creatinine urine ratio - PTH, intact and calcium  10. Urinary tract infection   - Urinalysis, Routine w reflex microscopic - Urine Culture  11. 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 Screening for ischemic heart disease  - EKG 12-Lead - VITAMIN D 25 Hydroxy   14. Medication management  - Urinalysis, Routine w reflex microscopic - Microalbumin / creatinine urine ratio - PTH, intact and calcium - CBC with Differential/Platelet - COMPLETE METABOLIC PANEL WITH GFR - Magnesium - Lipid panel - TSH - Hemoglobin A1c - Insulin, random - VITAMIN D 25 Hydroxy         Patient was counseled in prudent diet to achieve/maintain BMI less than 25 for weight control, BP monitoring, regular exercise and medications. Discussed med's effects and SE's. Screening  labs and tests as requested with regular follow-up as recommended. Over 40 minutes of exam, counseling, chart review and high complex critical decision making was performed.   Kirtland Bouchard, MD

## 2021-03-05 ENCOUNTER — Ambulatory Visit (INDEPENDENT_AMBULATORY_CARE_PROVIDER_SITE_OTHER): Payer: Medicare Other | Admitting: Internal Medicine

## 2021-03-05 ENCOUNTER — Other Ambulatory Visit: Payer: Self-pay

## 2021-03-05 ENCOUNTER — Encounter: Payer: Self-pay | Admitting: Internal Medicine

## 2021-03-05 VITALS — BP 140/68 | HR 61 | Temp 97.3°F | Resp 16 | Ht 61.0 in | Wt 143.6 lb

## 2021-03-05 DIAGNOSIS — H0102B Squamous blepharitis left eye, upper and lower eyelids: Secondary | ICD-10-CM | POA: Diagnosis not present

## 2021-03-05 DIAGNOSIS — Z79899 Other long term (current) drug therapy: Secondary | ICD-10-CM | POA: Diagnosis not present

## 2021-03-05 DIAGNOSIS — H353131 Nonexudative age-related macular degeneration, bilateral, early dry stage: Secondary | ICD-10-CM | POA: Diagnosis not present

## 2021-03-05 DIAGNOSIS — Z0000 Encounter for general adult medical examination without abnormal findings: Secondary | ICD-10-CM | POA: Diagnosis not present

## 2021-03-05 DIAGNOSIS — E1169 Type 2 diabetes mellitus with other specified complication: Secondary | ICD-10-CM | POA: Diagnosis not present

## 2021-03-05 DIAGNOSIS — E1122 Type 2 diabetes mellitus with diabetic chronic kidney disease: Secondary | ICD-10-CM | POA: Diagnosis not present

## 2021-03-05 DIAGNOSIS — H0102A Squamous blepharitis right eye, upper and lower eyelids: Secondary | ICD-10-CM | POA: Diagnosis not present

## 2021-03-05 DIAGNOSIS — Z1211 Encounter for screening for malignant neoplasm of colon: Secondary | ICD-10-CM

## 2021-03-05 DIAGNOSIS — N184 Chronic kidney disease, stage 4 (severe): Secondary | ICD-10-CM | POA: Diagnosis not present

## 2021-03-05 DIAGNOSIS — I482 Chronic atrial fibrillation, unspecified: Secondary | ICD-10-CM | POA: Diagnosis not present

## 2021-03-05 DIAGNOSIS — Z0001 Encounter for general adult medical examination with abnormal findings: Secondary | ICD-10-CM

## 2021-03-05 DIAGNOSIS — N390 Urinary tract infection, site not specified: Secondary | ICD-10-CM

## 2021-03-05 DIAGNOSIS — H1045 Other chronic allergic conjunctivitis: Secondary | ICD-10-CM | POA: Diagnosis not present

## 2021-03-05 DIAGNOSIS — I5032 Chronic diastolic (congestive) heart failure: Secondary | ICD-10-CM | POA: Diagnosis not present

## 2021-03-05 DIAGNOSIS — I10 Essential (primary) hypertension: Secondary | ICD-10-CM

## 2021-03-05 DIAGNOSIS — D6869 Other thrombophilia: Secondary | ICD-10-CM

## 2021-03-05 DIAGNOSIS — E559 Vitamin D deficiency, unspecified: Secondary | ICD-10-CM | POA: Diagnosis not present

## 2021-03-05 DIAGNOSIS — H43811 Vitreous degeneration, right eye: Secondary | ICD-10-CM | POA: Diagnosis not present

## 2021-03-05 DIAGNOSIS — Z136 Encounter for screening for cardiovascular disorders: Secondary | ICD-10-CM | POA: Diagnosis not present

## 2021-03-05 DIAGNOSIS — H04123 Dry eye syndrome of bilateral lacrimal glands: Secondary | ICD-10-CM | POA: Diagnosis not present

## 2021-03-05 DIAGNOSIS — E119 Type 2 diabetes mellitus without complications: Secondary | ICD-10-CM | POA: Diagnosis not present

## 2021-03-05 DIAGNOSIS — Z1212 Encounter for screening for malignant neoplasm of rectum: Secondary | ICD-10-CM

## 2021-03-05 DIAGNOSIS — E785 Hyperlipidemia, unspecified: Secondary | ICD-10-CM

## 2021-03-05 DIAGNOSIS — N179 Acute kidney failure, unspecified: Secondary | ICD-10-CM

## 2021-03-05 DIAGNOSIS — H40011 Open angle with borderline findings, low risk, right eye: Secondary | ICD-10-CM | POA: Diagnosis not present

## 2021-03-05 DIAGNOSIS — I700 Atherosclerosis of aorta: Secondary | ICD-10-CM

## 2021-03-05 DIAGNOSIS — G250 Essential tremor: Secondary | ICD-10-CM

## 2021-03-05 DIAGNOSIS — H401122 Primary open-angle glaucoma, left eye, moderate stage: Secondary | ICD-10-CM | POA: Diagnosis not present

## 2021-03-05 DIAGNOSIS — H25813 Combined forms of age-related cataract, bilateral: Secondary | ICD-10-CM | POA: Diagnosis not present

## 2021-03-05 LAB — HM DIABETES EYE EXAM

## 2021-03-05 MED ORDER — DIAZEPAM 2 MG PO TABS: ORAL_TABLET | ORAL | 0 refills | Status: DC

## 2021-03-05 NOTE — Progress Notes (Deleted)
Cardiology Office Note   Date:  03/05/2021   ID:  Briana [name] 13-May-1934, MRN [id number]  PCP:  Unk Pinto, MD  Cardiologist:  Dr. Claiborne Billings  No chief complaint on file.    History of Present Illness: Briana [name] is a 87 y.o. female who presents for ongoing assessment and management of chronic diastolic heart failure, atrial fibrillation diagnosed in 2020, hyperlipidemia, with history of GERD, prediabetes, and IBS.  She underwent cardioversion on 12/20/2018 and again on 06/16/2019.  Her second cardioversion was complicated by significant bradycardia prompting discontinuation of atenolol.  However atenolol was later restarted.  Echocardiogram obtained on 01/24/2021 showed EF 55%, moderately elevated PA peak pressure of 53.4 mmHg, mild to moderate MR.Eliquis was reduced to 2.5 mg twice daily given age and creatinine clearance.  She was eventually discharged on amlodipine 2.5 mg daily, amiodarone 200 mg daily, Lasix 80 mg twice daily, Eliquis 2.5 mg twice daily and potassium supplement 20 mEq twice daily.  Her weight went down from 163 pounds down to 150 pounds   She was seen last in the office by Briana Deforest, PA, at that office visit her weight was 149.8 pounds.  She still complaining of dyspnea on exertion but no dyspnea at rest.  She complained of tenderness under her breasts on both sides.  A follow-up BMET was ordered, and she had no evidence of volume overload.  She was in normal sinus rhythm at the time of that office visit.    Past Medical History:  Diagnosis Date  . Acute on chronic diastolic (congestive) heart failure ([place]) 11/21/2018  . Cancer ([place])    skin cancer  . DDD (degenerative disc disease)   . Diverticula of colon   . Diverticulitis   . DJD (degenerative joint disease)   . GERD (gastroesophageal reflux disease)    takes ranitidine  . Heart murmur   . History of hiatal hernia   . History of pneumonia   . Hx of irritable bowel syndrome   . Hyperlipidemia   .  Occasional tremors   . Osteoporosis   . Pre-diabetes   . Varicose veins   . Vitamin D deficiency     Past Surgical History:  Procedure Laterality Date  . APPENDECTOMY    . BREAST EXCISIONAL BIOPSY Right   . BREAST SURGERY Right    fluid drained from breast  . CARDIOVERSION N/A 12/20/2018   Procedure: CARDIOVERSION;  Surgeon: Buford Dresser, MD;  Location: [facility] ENDOSCOPY;  Service: Cardiovascular;  Laterality: N/A;  . CARDIOVERSION N/A 06/16/2019   Procedure: CARDIOVERSION;  Surgeon: Fay Records, MD;  Location: [place];  Service: Cardiovascular;  Laterality: N/A;  . COLONOSCOPY    . ESOPHAGOGASTRODUODENOSCOPY    . JOINT REPLACEMENT Left    left hip  . LUMBAR LAMINECTOMY/DECOMPRESSION MICRODISCECTOMY N/A 06/05/2015   Procedure: L3-L5 DECOMPRESSION ;  Surgeon: Melina Schools, MD;  Location: [place];  Service: Orthopedics;  Laterality: N/A;  . OPEN REDUCTION INTERNAL FIXATION (ORIF) DISTAL RADIAL FRACTURE Right 01/10/2014   Procedure: OPEN REDUCTION INTERNAL FIXATION (ORIF) DISTAL RADIAL FRACTURE;  Surgeon: Linna Hoff, MD;  Location: [place];  Service: Orthopedics;  Laterality: Right;  . SPINAL CORD DECOMPRESSION  06/05/2015   L3 L 5  . THYROID SURGERY    . TONSILLECTOMY    . TUBAL LIGATION    . varicose veins stripped       Current Outpatient Medications  Medication Sig Dispense Refill  . acetaminophen (TYLENOL) 500 MG tablet Take 500  mg by mouth. Take 500 to 1000 mg every 6 hours as needed.    Marland Kitchen amiodarone (PACERONE) 200 MG tablet Take 1 tablet by mouth once daily 90 tablet 3  . amLODipine (NORVASC) 2.5 MG tablet Take 1 tablet (2.5 mg total) by mouth daily. 30 tablet 0  . apixaban (ELIQUIS) 2.5 MG TABS tablet Take 1 tablet (2.5 mg total) by mouth 2 (two) times daily. 60 tablet 1  . Ascorbic Acid (VITAMIN C) 1000 MG tablet Take 1,000 mg by mouth daily.     . Carboxymeth-Glycerin-Polysorb (REFRESH DIGITAL OP) Place 1 drop into the right eye daily as needed (dry eyes).     . cholecalciferol (VITAMIN D3) 25 MCG (1000 UT) tablet Take 1,000 Units by mouth 2 (two) times daily.    . Cyanocobalamin (B-12 SL) Place 1 tablet under the tongue daily.    . furosemide (LASIX) 80 MG tablet Take 1 tablet (80 mg total) by mouth 2 (two) times daily. 120 tablet 2  . Misc Natural Products (OSTEO BI-FLEX JOINT SHIELD PO) Take 1 tablet by mouth 2 (two) times daily.     . Multiple Vitamins-Minerals (PRESERVISION AREDS 2 PO) Take 1 capsule by mouth 2 (two) times daily.    . Olopatadine HCl (PATADAY OP) Place 1 drop into the left eye daily.    . potassium chloride SA (KLOR-CON) 20 MEQ tablet Take 1 tablet (20 mEq total) by mouth 2 (two) times daily. 60 tablet 0  . PROAIR HFA 108 (90 Base) MCG/ACT inhaler Inhale 2 puffs into the lungs every 6 (six) hours as needed for wheezing or shortness of breath.    . Pyridoxine HCl (VITAMIN B-6 PO) Take 1 capsule by mouth daily.    Marland Kitchen zinc gluconate 50 MG tablet Take 50 mg by mouth daily.     No current facility-administered medications for this visit.    Allergies:   Accupril [quinapril hcl], Neosporin [neomycin-bacitracin zn-polymyx], Prednisone, Reglan [metoclopramide], Tramadol, and Adhesive [tape]    Social History:  The patient  reports that she has never smoked. She has never used smokeless tobacco. She reports that she does not drink alcohol and does not use drugs.   Family History:  The patient's family history includes Cancer in her brother; Heart disease in her sister; Hyperlipidemia in her mother; Hypertension in her mother.    ROS: All other systems are reviewed and negative. Unless otherwise mentioned in H&P    PHYSICAL EXAM: VS:  There were no vitals taken for this visit. , BMI There is no height or weight on file to calculate BMI. GEN: Well nourished, well developed, in no acute distress HEENT: normal Neck: no JVD, carotid bruits, or masses Cardiac: ***RRR; no murmurs, rubs, or gallops,no edema  Respiratory:  Clear to  auscultation bilaterally, normal work of breathing GI: soft, nontender, nondistended, + BS MS: no deformity or atrophy Skin: warm and dry, no rash Neuro:  Strength and sensation are intact Psych: euthymic mood, full affect   EKG:  EKG {ACTION; IS/IS [id number]} ordered today. The ekg ordered today demonstrates ***   Recent Labs: 01/22/2021: ALT 35; B Natriuretic Peptide 464.4 01/23/2021: TSH 0.227 01/27/2021: Hemoglobin 11.6; Platelets 263 01/28/2021: Magnesium 2.1 01/31/2021: BUN 32; Creatinine, Ser 1.81; Potassium 5.2; Sodium 135    Lipid Panel    Component Value Date/Time   CHOL 202 (H) 02/28/2020 1459   TRIG 67 02/28/2020 1459   HDL 67 02/28/2020 1459   CHOLHDL 3.0 02/28/2020 1459   VLDL 15 05/04/2017 1339  LDLCALC 119 (H) 02/28/2020 1459      Wt Readings from Last 3 Encounters:  03/05/21 143 lb 9.6 oz (65.1 kg)  02/05/21 146 lb (66.2 kg)  01/31/21 149 lb 12.8 oz (67.9 kg)      Other studies Reviewed: 01/24/2021 Echocardiogram  1. Left ventricular ejection fraction, by estimation, is 55%. The left  ventricle has normal function. The left ventricle has no regional wall  motion abnormalities. There is mild left ventricular hypertrophy. Left  ventricular diastolic parameters are  indeterminate.  2. Right ventricular systolic function is normal. The right ventricular  size is mildly enlarged. There is moderately elevated pulmonary artery  systolic pressure. The estimated right ventricular systolic pressure is  53.6 mmHg.  3. Left atrial size was moderately dilated.  4. Right atrial size was mildly dilated.  5. The mitral valve is grossly normal. Mild to moderate mitral valve  regurgitation. No evidence of mitral stenosis.  6. The aortic valve is grossly normal. Aortic valve regurgitation is not  visualized. No aortic stenosis is present.  7. The inferior vena cava is dilated in size with <50% respiratory  variability, suggesting right atrial pressure of 15  mmHg.   ASSESSMENT AND PLAN:  1.  ***   Current medicines are reviewed at length with the patient today.  I have spent *** dedicated to the care of this patient on the date of this encounter to include pre-visit review of records, assessment, management and diagnostic testing,with shared decision making.  Labs/ tests ordered today include: *** Phill Myron. [place] Pugh, ANP, AACC   03/05/2021 4:50 PM    [facility] [place] [address] Office [phone] Fax [phone]  Notice: This dictation was prepared with Dragon dictation along with smaller phrase technology. Any transcriptional errors that result from this process are unintentional and may not be corrected upon review.

## 2021-03-06 ENCOUNTER — Encounter: Payer: Self-pay | Admitting: Internal Medicine

## 2021-03-06 ENCOUNTER — Other Ambulatory Visit: Payer: Self-pay | Admitting: Internal Medicine

## 2021-03-06 ENCOUNTER — Inpatient Hospital Stay (HOSPITAL_BASED_OUTPATIENT_CLINIC_OR_DEPARTMENT_OTHER)
Admission: EM | Admit: 2021-03-06 | Discharge: 2021-03-10 | DRG: 378 | Disposition: A | Discharge status: Home Health | Payer: Medicare Other | Attending: Internal Medicine | Admitting: Internal Medicine

## 2021-03-06 ENCOUNTER — Encounter (HOSPITAL_BASED_OUTPATIENT_CLINIC_OR_DEPARTMENT_OTHER): Payer: Self-pay | Admitting: Emergency Medicine

## 2021-03-06 DIAGNOSIS — Z888 Allergy status to other drugs, medicaments and biological substances status: Secondary | ICD-10-CM | POA: Diagnosis not present

## 2021-03-06 DIAGNOSIS — I10 Essential (primary) hypertension: Secondary | ICD-10-CM | POA: Diagnosis present

## 2021-03-06 DIAGNOSIS — K219 Gastro-esophageal reflux disease without esophagitis: Secondary | ICD-10-CM | POA: Diagnosis not present

## 2021-03-06 DIAGNOSIS — E1169 Type 2 diabetes mellitus with other specified complication: Secondary | ICD-10-CM | POA: Diagnosis present

## 2021-03-06 DIAGNOSIS — N183 Chronic kidney disease, stage 3 unspecified: Secondary | ICD-10-CM | POA: Diagnosis present

## 2021-03-06 DIAGNOSIS — N1832 Chronic kidney disease, stage 3b: Secondary | ICD-10-CM | POA: Diagnosis not present

## 2021-03-06 DIAGNOSIS — Z981 Arthrodesis status: Secondary | ICD-10-CM | POA: Diagnosis not present

## 2021-03-06 DIAGNOSIS — Z8249 Family history of ischemic heart disease and other diseases of the circulatory system: Secondary | ICD-10-CM

## 2021-03-06 DIAGNOSIS — Z79899 Other long term (current) drug therapy: Secondary | ICD-10-CM | POA: Diagnosis not present

## 2021-03-06 DIAGNOSIS — K648 Other hemorrhoids: Secondary | ICD-10-CM | POA: Diagnosis not present

## 2021-03-06 DIAGNOSIS — K5731 Diverticulosis of large intestine without perforation or abscess with bleeding: Secondary | ICD-10-CM | POA: Diagnosis not present

## 2021-03-06 DIAGNOSIS — E1122 Type 2 diabetes mellitus with diabetic chronic kidney disease: Secondary | ICD-10-CM | POA: Diagnosis not present

## 2021-03-06 DIAGNOSIS — Z83438 Family history of other disorder of lipoprotein metabolism and other lipidemia: Secondary | ICD-10-CM | POA: Diagnosis not present

## 2021-03-06 DIAGNOSIS — I130 Hypertensive heart and chronic kidney disease with heart failure and stage 1 through stage 4 chronic kidney disease, or unspecified chronic kidney disease: Secondary | ICD-10-CM | POA: Diagnosis not present

## 2021-03-06 DIAGNOSIS — E785 Hyperlipidemia, unspecified: Secondary | ICD-10-CM | POA: Diagnosis not present

## 2021-03-06 DIAGNOSIS — Z85828 Personal history of other malignant neoplasm of skin: Secondary | ICD-10-CM

## 2021-03-06 DIAGNOSIS — Z7901 Long term (current) use of anticoagulants: Secondary | ICD-10-CM | POA: Diagnosis not present

## 2021-03-06 DIAGNOSIS — D62 Acute posthemorrhagic anemia: Secondary | ICD-10-CM | POA: Diagnosis not present

## 2021-03-06 DIAGNOSIS — K2100 Gastro-esophageal reflux disease with esophagitis, without bleeding: Secondary | ICD-10-CM | POA: Diagnosis present

## 2021-03-06 DIAGNOSIS — G250 Essential tremor: Secondary | ICD-10-CM

## 2021-03-06 DIAGNOSIS — B9620 Unspecified Escherichia coli [E. coli] as the cause of diseases classified elsewhere: Secondary | ICD-10-CM | POA: Diagnosis present

## 2021-03-06 DIAGNOSIS — E559 Vitamin D deficiency, unspecified: Secondary | ICD-10-CM | POA: Diagnosis present

## 2021-03-06 DIAGNOSIS — K625 Hemorrhage of anus and rectum: Secondary | ICD-10-CM | POA: Diagnosis not present

## 2021-03-06 DIAGNOSIS — N2581 Secondary hyperparathyroidism of renal origin: Secondary | ICD-10-CM | POA: Insufficient documentation

## 2021-03-06 DIAGNOSIS — D500 Iron deficiency anemia secondary to blood loss (chronic): Secondary | ICD-10-CM | POA: Diagnosis not present

## 2021-03-06 DIAGNOSIS — I5032 Chronic diastolic (congestive) heart failure: Secondary | ICD-10-CM | POA: Diagnosis not present

## 2021-03-06 DIAGNOSIS — Z91048 Other nonmedicinal substance allergy status: Secondary | ICD-10-CM | POA: Diagnosis not present

## 2021-03-06 DIAGNOSIS — K922 Gastrointestinal hemorrhage, unspecified: Secondary | ICD-10-CM | POA: Diagnosis present

## 2021-03-06 DIAGNOSIS — Z20822 Contact with and (suspected) exposure to covid-19: Secondary | ICD-10-CM | POA: Diagnosis not present

## 2021-03-06 DIAGNOSIS — N3000 Acute cystitis without hematuria: Secondary | ICD-10-CM

## 2021-03-06 DIAGNOSIS — N390 Urinary tract infection, site not specified: Secondary | ICD-10-CM | POA: Diagnosis not present

## 2021-03-06 DIAGNOSIS — I482 Chronic atrial fibrillation, unspecified: Secondary | ICD-10-CM | POA: Diagnosis not present

## 2021-03-06 LAB — CBC WITH DIFFERENTIAL/PLATELET
Abs Immature Granulocytes: 0.03 10*3/uL (ref 0.00–0.07)
Absolute Monocytes: 890 cells/uL (ref 200–950)
Basophils Absolute: 48 cells/uL (ref 0–200)
Basophils Absolute: 0.1 10*3/uL (ref 0.0–0.1)
Basophils Relative: 0.7 %
Basophils Relative: 1 %
Eosinophils Absolute: 469 cells/uL (ref 15–500)
Eosinophils Absolute: 0.5 10*3/uL (ref 0.0–0.5)
Eosinophils Relative: 6.8 %
Eosinophils Relative: 7 %
HCT: 39.9 % (ref 35.0–45.0)
HCT: 37.7 % (ref 36.0–46.0)
Hemoglobin: 12.9 g/dL (ref 11.7–15.5)
Hemoglobin: 12.3 g/dL (ref 12.0–15.0)
Immature Granulocytes: 1 %
Lymphocytes Relative: 9 %
Lymphs Abs: 932 cells/uL (ref 850–3900)
Lymphs Abs: 0.6 10*3/uL — ABNORMAL LOW (ref 0.7–4.0)
MCH: 28.6 pg (ref 27.0–33.0)
MCH: 29.9 pg (ref 26.0–34.0)
MCHC: 32.3 g/dL (ref 32.0–36.0)
MCHC: 32.6 g/dL (ref 30.0–36.0)
MCV: 88.5 fL (ref 80.0–100.0)
MCV: 91.5 fL (ref 80.0–100.0)
MPV: 11.5 fL (ref 7.5–12.5)
Monocytes Absolute: 0.8 10*3/uL (ref 0.1–1.0)
Monocytes Relative: 12.9 %
Monocytes Relative: 13 %
Neutro Abs: 4561 cells/uL (ref 1500–7800)
Neutro Abs: 4.6 10*3/uL (ref 1.7–7.7)
Neutrophils Relative %: 66.1 %
Neutrophils Relative %: 69 %
Platelets: 253 10*3/uL (ref 140–400)
Platelets: 234 10*3/uL (ref 150–400)
RBC: 4.51 10*6/uL (ref 3.80–5.10)
RBC: 4.12 MIL/uL (ref 3.87–5.11)
RDW: 14.1 % (ref 11.0–15.0)
RDW: 15.8 % — ABNORMAL HIGH (ref 11.5–15.5)
Total Lymphocyte: 13.5 %
WBC: 6.9 10*3/uL (ref 3.8–10.8)
WBC: 6.6 10*3/uL (ref 4.0–10.5)
nRBC: 0 % (ref 0.0–0.2)

## 2021-03-06 LAB — COMPLETE METABOLIC PANEL WITH GFR
AG Ratio: 1.8 (calc) (ref 1.0–2.5)
ALT: 22 U/L (ref 6–29)
AST: 27 U/L (ref 10–35)
Albumin: 4.3 g/dL (ref 3.6–5.1)
Alkaline phosphatase (APISO): 96 U/L (ref 37–153)
BUN/Creatinine Ratio: 18 (calc) (ref 6–22)
BUN: 31 mg/dL — ABNORMAL HIGH (ref 7–25)
CO2: 27 mmol/L (ref 20–32)
Calcium: 10.1 mg/dL (ref 8.6–10.4)
Chloride: 100 mmol/L (ref 98–110)
Creat: 1.71 mg/dL — ABNORMAL HIGH (ref 0.60–0.88)
GFR, Est African American: 31 mL/min/{1.73_m2} — ABNORMAL LOW (ref >=60)
GFR, Est Non African American: 27 mL/min/{1.73_m2} — ABNORMAL LOW (ref >=60)
Globulin: 2.4 g/dL (calc) (ref 1.9–3.7)
Glucose, Bld: 127 mg/dL — ABNORMAL HIGH (ref 65–99)
Potassium: 4.2 mmol/L (ref 3.5–5.3)
Sodium: 139 mmol/L (ref 135–146)
Total Bilirubin: 0.6 mg/dL (ref 0.2–1.2)
Total Protein: 6.7 g/dL (ref 6.1–8.1)

## 2021-03-06 LAB — RESP PANEL BY RT-PCR (FLU A&B, COVID) ARPGX2
Influenza A by PCR: NEGATIVE
Influenza B by PCR: NEGATIVE
SARS Coronavirus 2 by RT PCR: NEGATIVE

## 2021-03-06 LAB — HEMOGLOBIN AND HEMATOCRIT, BLOOD
HCT: 33.3 % — ABNORMAL LOW (ref 36.0–46.0)
Hemoglobin: 10.6 g/dL — ABNORMAL LOW (ref 12.0–15.0)

## 2021-03-06 LAB — COMPREHENSIVE METABOLIC PANEL
ALT: 17 U/L (ref 0–44)
AST: 22 U/L (ref 15–41)
Albumin: 3.9 g/dL (ref 3.5–5.0)
Alkaline Phosphatase: 80 U/L (ref 38–126)
Anion gap: 10 (ref 5–15)
BUN: 30 mg/dL — ABNORMAL HIGH (ref 8–23)
CO2: 25 mmol/L (ref 22–32)
Calcium: 9.8 mg/dL (ref 8.9–10.3)
Chloride: 101 mmol/L (ref 98–111)
Creatinine, Ser: 1.71 mg/dL — ABNORMAL HIGH (ref 0.44–1.00)
GFR, Estimated: 29 mL/min — ABNORMAL LOW (ref >60)
Glucose, Bld: 207 mg/dL — ABNORMAL HIGH (ref 70–99)
Potassium: 4.2 mmol/L (ref 3.5–5.1)
Sodium: 136 mmol/L (ref 135–145)
Total Bilirubin: 0.6 mg/dL (ref 0.3–1.2)
Total Protein: 6.5 g/dL (ref 6.5–8.1)

## 2021-03-06 LAB — HEMOGLOBIN A1C
Hgb A1c MFr Bld: 7.1 % of total Hgb — ABNORMAL HIGH (ref <5.7)
Mean Plasma Glucose: 157 mg/dL
eAG (mmol/L): 8.7 mmol/L

## 2021-03-06 LAB — CBC
HCT: 33.1 % — ABNORMAL LOW (ref 36.0–46.0)
Hemoglobin: 10.5 g/dL — ABNORMAL LOW (ref 12.0–15.0)
MCH: 29.1 pg (ref 26.0–34.0)
MCHC: 31.7 g/dL (ref 30.0–36.0)
MCV: 91.7 fL (ref 80.0–100.0)
Platelets: 216 10*3/uL (ref 150–400)
RBC: 3.61 MIL/uL — ABNORMAL LOW (ref 3.87–5.11)
RDW: 15.6 % — ABNORMAL HIGH (ref 11.5–15.5)
WBC: 5.7 10*3/uL (ref 4.0–10.5)
nRBC: 0 % (ref 0.0–0.2)

## 2021-03-06 LAB — PTH, INTACT AND CALCIUM
Calcium: 10.1 mg/dL (ref 8.6–10.4)
PTH: 148 pg/mL — ABNORMAL HIGH (ref 16–77)

## 2021-03-06 LAB — PROTIME-INR
INR: 1.2 (ref 0.8–1.2)
Prothrombin Time: 15.6 seconds — ABNORMAL HIGH (ref 11.4–15.2)

## 2021-03-06 LAB — LIPID PANEL
Cholesterol: 206 mg/dL — ABNORMAL HIGH (ref <200)
HDL: 58 mg/dL (ref >=50)
LDL Cholesterol (Calc): 125 mg/dL (calc) — ABNORMAL HIGH
Non-HDL Cholesterol (Calc): 148 mg/dL (calc) — ABNORMAL HIGH (ref <130)
Total CHOL/HDL Ratio: 3.6 (calc) (ref <5.0)
Triglycerides: 115 mg/dL (ref <150)

## 2021-03-06 LAB — INSULIN, RANDOM: Insulin: 10.1 u[IU]/mL

## 2021-03-06 LAB — MAGNESIUM: Magnesium: 2.2 mg/dL (ref 1.5–2.5)

## 2021-03-06 LAB — TSH: TSH: 1.01 mIU/L (ref 0.40–4.50)

## 2021-03-06 LAB — VITAMIN D 25 HYDROXY (VIT D DEFICIENCY, FRACTURES): Vit D, 25-Hydroxy: 102 ng/mL — ABNORMAL HIGH (ref 30–100)

## 2021-03-06 LAB — OCCULT BLOOD X 1 CARD TO LAB, STOOL: Fecal Occult Bld: POSITIVE — AB

## 2021-03-06 LAB — APTT: aPTT: 40 seconds — ABNORMAL HIGH (ref 24–36)

## 2021-03-06 MED ORDER — VITAMIN D 25 MCG (1000 UNIT) PO TABS
1000.0000 [IU] | ORAL_TABLET | Freq: Every day | ORAL | Status: DC
  Administered 2021-03-07 – 2021-03-10 (×4): 1000 [IU] via ORAL
  Filled 2021-03-06 (×4): qty 1

## 2021-03-06 MED ORDER — DIAZEPAM 2 MG PO TABS
2.0000 mg | ORAL_TABLET | Freq: Three times a day (TID) | ORAL | Status: DC | PRN
  Administered 2021-03-07 – 2021-03-09 (×4): 2 mg via ORAL
  Filled 2021-03-06 (×4): qty 1

## 2021-03-06 MED ORDER — ALBUTEROL SULFATE HFA 108 (90 BASE) MCG/ACT IN AERS
2.0000 | INHALATION_SPRAY | Freq: Four times a day (QID) | RESPIRATORY_TRACT | Status: DC | PRN
  Filled 2021-03-06: qty 6.7

## 2021-03-06 MED ORDER — AMIODARONE HCL 200 MG PO TABS
200.0000 mg | ORAL_TABLET | Freq: Every day | ORAL | Status: DC
  Administered 2021-03-07 – 2021-03-10 (×4): 200 mg via ORAL
  Filled 2021-03-06 (×4): qty 1

## 2021-03-06 MED ORDER — SODIUM CHLORIDE 0.9 % IV SOLN: INTRAVENOUS | Status: DC

## 2021-03-06 MED ORDER — SODIUM CHLORIDE 0.9 % IV SOLN: 1.0000 g | INTRAVENOUS | Status: DC

## 2021-03-06 MED ORDER — SODIUM CHLORIDE 0.9 % IV SOLN
1.0000 g | INTRAVENOUS | Status: DC
  Administered 2021-03-06 – 2021-03-08 (×3): 1 g via INTRAVENOUS
  Filled 2021-03-06 (×3): qty 10

## 2021-03-06 MED ORDER — PANTOPRAZOLE SODIUM 40 MG IV SOLR
40.0000 mg | INTRAVENOUS | Status: DC
  Administered 2021-03-06 – 2021-03-09 (×4): 40 mg via INTRAVENOUS
  Filled 2021-03-06 (×4): qty 40

## 2021-03-06 NOTE — Progress Notes (Signed)
============================================================ -   Test results slightly outside the reference range are not unusual. If there is anything important, I will review this with you,  otherwise it is considered normal test values.  If you have further questions,  please do not hesitate to contact me at the office or via My Chart.  ============================================================ ============================================================  -  U/A still appears to have a UTI & culture is pending ============================================================ ============================================================  - PTH  Hormone that regulates Calcium Balance is elevated due to  Stage 4 Chronic Kidney Disease & fortunately Calcium level is Normal.  ============================================================ ============================================================  - CBC is Normal & OK  ============================================================== ==============================================================  - Kidney Functions show elevated BUN 31 /creat 1.71 & GFR 27 = CKD Stage 4 ============================================================== ==============================================================  - Total Chol = 206 - elevated        (Ideal or Goal is less than 180 )   - and   - Bad LDL Chol = 125 - Also too high        (Ideal or Goal is less than 70  !  )  ==============================================================  - Vitamin D = 102 - borderline elevated  - So Please stop your Vit D 1,000 units. ============================================================== ==============================================================  - A1c  = 7.1 - is a little worse - will need to discuss treatment ,  but first would recommend a much stricter diet & weight loss to   avoid starting insulin which will only increase weight gain.   ============================================================== ==============================================================  All Else - Electrolytes - Liver -  Magnesium  - all  Normal / OK ==============================================================

## 2021-03-06 NOTE — Progress Notes (Signed)
Patient admitted to [facility] from [facility]. Patient is alert and oriented x4. Vital signs are stable and she is on room air. Has no complaints of pain. Skin is intact, no signs of skin breakdown noted on exam. Patient belongings at bedside (clothing, has on gold stud earrings). The patient was shown how to use the call bell. Call bell, phone and bedside table are within reach; bed is in the lowest position.

## 2021-03-06 NOTE — ED Provider Notes (Signed)
[facility] EMERGENCY DEPT Provider Note   CSN: [id number] Arrival date & time: 03/06/21  1019     History Chief Complaint  Patient presents with  . Rectal Bleeding    Briana [name] is a 87 y.o. female.  HPI   Patient presents to the ED for evaluation of rectal bleeding.  Patient states she had an episode on Monday.  Patient noticed bright red blood in her stool.  Tuesday it was normal.  She did not have any episodes on Wednesday.  This morning she had another episodes of large amount of bright red blood while having a bowel movement.  Patient did have some abdominal cramping during that episode but currently she is not having any pain or discomfort.  She has not had any issues with nausea vomiting.  No trouble with her appetite.  She did have some lightheadedness when standing.  Family called her doctor who recommended she come to the ED for further evaluation.  Patient does take anticoagulation.  She has not had episodes of prior rectal bleeding in the past  Past Medical History:  Diagnosis Date  . Acute on chronic diastolic (congestive) heart failure ([place]) 11/21/2018  . Cancer ([place])    skin cancer  . DDD (degenerative disc disease)   . Diverticula of colon   . Diverticulitis   . DJD (degenerative joint disease)   . GERD (gastroesophageal reflux disease)    takes ranitidine  . Heart murmur   . History of hiatal hernia   . History of pneumonia   . Hx of irritable bowel syndrome   . Hyperlipidemia   . Hypertension   . Occasional tremors   . Osteoporosis   . Pre-diabetes   . Varicose veins   . Vitamin D deficiency     Patient Active Problem List   Diagnosis Date Noted  . Aortic atherosclerosis ([place]) by Chest CTA on 11/21/2018.  03/05/2021  . 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 Acquired thrombophilia ([place]) 11/15/2019  . Chronic diastolic heart failure ([place]) 04/03/2019  . CKD stage 3 due to type 2 diabetes mellitus  ([place]) 02/23/2018  . Bilateral sensorineural hearing loss 07/02/2017  . Obesity (BMI 30.0-34.9) 09/05/2015  . Lumbar stenosis 06/05/2015  . GERD 11/20/2013  . Osteopenia 11/20/2013  . Essential hypertension 11/19/2013  . Type 2 diabetes mellitus with hyperlipidemia ([place]) 11/19/2013  . Vitamin D deficiency 11/19/2013  . Diffuse cystic mastopathy 11/19/2013    Past Surgical History:  Procedure Laterality Date  . APPENDECTOMY    . BREAST EXCISIONAL BIOPSY Right   . BREAST SURGERY Right    fluid drained from breast  . CARDIOVERSION N/A 12/20/2018   Procedure: CARDIOVERSION;  Surgeon: Buford Dresser, MD;  Location: [facility] ENDOSCOPY;  Service: Cardiovascular;  Laterality: N/A;  . CARDIOVERSION N/A 06/16/2019   Procedure: CARDIOVERSION;  Surgeon: Fay Records, MD;  Location: [facility];  Service: Cardiovascular;  Laterality: N/A;  . COLONOSCOPY    . ESOPHAGOGASTRODUODENOSCOPY    . JOINT REPLACEMENT Left    left hip  . LUMBAR LAMINECTOMY/DECOMPRESSION MICRODISCECTOMY N/A 06/05/2015   Procedure: L3-L5 DECOMPRESSION ;  Surgeon: Melina Schools, MD;  Location: [facility];  Service: Orthopedics;  Laterality: N/A;  . OPEN REDUCTION INTERNAL FIXATION (ORIF) DISTAL RADIAL FRACTURE Right 01/10/2014   Procedure: OPEN REDUCTION INTERNAL FIXATION (ORIF) DISTAL RADIAL FRACTURE;  Surgeon: Linna Hoff, MD;  Location: [facility];  Service: Orthopedics;  Laterality: Right;  . SPINAL CORD DECOMPRESSION  06/05/2015  L3 L 5  . THYROID SURGERY    . TONSILLECTOMY    . TUBAL LIGATION    . varicose veins stripped       OB History   No obstetric history on file.     Family History  Problem Relation Age of Onset  . Hypertension Mother   . Hyperlipidemia Mother   . Heart disease Sister   . Cancer Brother        prostate    Social History   Tobacco Use  . Smoking status: Never Smoker  . Smokeless tobacco: Never Used  Substance Use Topics  . Alcohol use: No  . Drug use: No    Home  Medications Prior to Admission medications   Medication Sig Start Date End Date Taking? Authorizing Provider  acetaminophen (TYLENOL) 500 MG tablet Take 500 mg by mouth. Take 500 to 1000 mg every 6 hours as needed.    [provider]  amiodarone (PACERONE) 200 MG tablet Take 1 tablet by mouth once daily 02/24/21   Troy Sine, MD  amLODipine (NORVASC) 2.5 MG tablet Take 1 tablet (2.5 mg total) by mouth daily. 02/21/21   Lendon Colonel, NP  apixaban (ELIQUIS) 2.5 MG TABS tablet Take 1 tablet (2.5 mg total) by mouth 2 (two) times daily. 01/28/21   Hongalgi, Lenis Dickinson, MD  Ascorbic Acid (VITAMIN C) 1000 MG tablet Take 1,000 mg by mouth daily.     [provider]  Carboxymeth-Glycerin-Polysorb (REFRESH DIGITAL OP) Place 1 drop into the right eye daily as needed (dry eyes).    [provider]  cholecalciferol (VITAMIN D3) 25 MCG (1000 UT) tablet Take 1,000 Units by mouth 2 (two) times daily.    [provider]  Cyanocobalamin (B-12 SL) Place 1 tablet under the tongue daily.    [provider]  diazepam (VALIUM) 2 MG tablet Take  1 tablet 3 to 4  x /day  with Meals  for Tremor 03/05/21   Unk Pinto, MD  furosemide (LASIX) 80 MG tablet Take 1 tablet (80 mg total) by mouth 2 (two) times daily. 02/25/21   Troy Sine, MD  Misc Natural Products (OSTEO BI-FLEX JOINT SHIELD PO) Take 1 tablet by mouth 2 (two) times daily.     [provider]  Multiple Vitamins-Minerals (PRESERVISION AREDS 2 PO) Take 1 capsule by mouth 2 (two) times daily.    [provider]  Olopatadine HCl (PATADAY OP) Place 1 drop into the left eye daily.    [provider]  potassium chloride SA (KLOR-CON) 20 MEQ tablet Take 1 tablet (20 mEq total) by mouth 2 (two) times daily. 01/28/21   Hongalgi, Lenis Dickinson, MD  PROAIR HFA [phone] Base) MCG/ACT inhaler Inhale 2 puffs into the lungs every 6 (six) hours as needed for wheezing or shortness of breath. 01/28/21    Hongalgi, Lenis Dickinson, MD  Pyridoxine HCl (VITAMIN B-6 PO) Take 1 capsule by mouth daily.    [provider]  zinc gluconate 50 MG tablet Take 50 mg by mouth daily.    [provider]    Allergies    Accupril [quinapril hcl], Neosporin [neomycin-bacitracin zn-polymyx], Prednisone, Reglan [metoclopramide], Tramadol, and Adhesive [tape]  Review of Systems   Review of Systems  All other systems reviewed and are negative.   Physical Exam Updated Vital Signs BP 132/76 (BP Location: Left Arm)   Pulse 72   Temp 98.1 F (36.7 C) (Oral)   Resp 16  Ht 1.549 m (5\' 1" )   Wt 63.5 kg   SpO2 96%   BMI 26.45 kg/m   Physical Exam Vitals and nursing note reviewed.  Constitutional:      General: She is not in acute distress.    Appearance: She is well-developed.  HENT:     Head: Normocephalic and atraumatic.     Right Ear: External ear normal.     Left Ear: External ear normal.  Eyes:     General: No scleral icterus.       Right eye: No discharge.        Left eye: No discharge.     Conjunctiva/sclera: Conjunctivae normal.  Neck:     Trachea: No tracheal deviation.  Cardiovascular:     Rate and Rhythm: Normal rate and regular rhythm.  Pulmonary:     Effort: Pulmonary effort is normal. No respiratory distress.     Breath sounds: Normal breath sounds. No stridor. No wheezing or rales.  Abdominal:     General: Bowel sounds are normal. There is no distension.     Palpations: Abdomen is soft.     Tenderness: There is no abdominal tenderness. There is no guarding or rebound.  Genitourinary:    Comments: Blood-tinged mucus noted on rectal exam, no clots, no mass appreciated Musculoskeletal:        General: No tenderness.     Cervical back: Neck supple.  Skin:    General: Skin is warm and dry.     Findings: No rash.  Neurological:     Mental Status: She is alert.     Cranial Nerves: No cranial nerve deficit (no facial droop, extraocular movements intact, no slurred  speech).     Sensory: No sensory deficit.     Motor: No abnormal muscle tone or seizure activity.     Coordination: Coordination normal.     ED Results / Procedures / Treatments   Labs (all labs ordered are listed, but only abnormal results are displayed) Labs Reviewed  COMPREHENSIVE METABOLIC PANEL - Abnormal; Notable for the following components:      Result Value   Glucose, Bld 207 (*)    BUN 30 (*)    Creatinine, Ser 1.71 (*)    GFR, Estimated 29 (*)    All other components within normal limits  CBC WITH DIFFERENTIAL/PLATELET - Abnormal; Notable for the following components:   RDW 15.8 (*)    Lymphs Abs 0.6 (*)    All other components within normal limits  PROTIME-INR - Abnormal; Notable for the following components:   Prothrombin Time 15.6 (*)    All other components within normal limits  APTT - Abnormal; Notable for the following components:   aPTT 40 (*)    All other components within normal limits  OCCULT BLOOD X 1 CARD TO LAB, STOOL - Abnormal; Notable for the following components:   Fecal Occult Bld POSITIVE (*)    All other components within normal limits    EKG None  Radiology No results found.  Procedures Procedures   Medications Ordered in ED Medications - No data to display  ED Course  I have reviewed the triage vital signs and the nursing notes.  Pertinent labs & imaging results that were available during my care of the patient were reviewed by me and considered in my medical decision making (see chart for details).  Clinical Course as of 03/07/21 0659  Thu Mar 06, 2021  1143 CBC normal.  Fecal occult is positive  for blood [JK]  1324 Case discussed with Dr Roosevelt Locks [JK]    Clinical Course User Index [JK] Dorie Rank, MD   MDM Rules/Calculators/A&P                          Patient presents to the ED for evaluation of rectal bleeding.  Symptoms have been intermittent since Monday but increasing today.  Patient is on anticoagulants.  Fortunately  she is hemodynamically stable and no distress.  However patient is at risk for significant bleeding.  I will consult with the hospitalist for transfer and admission to the hospital for further evaluation.  Final Clinical Impression(s) / ED Diagnoses Final diagnoses:  Gastrointestinal hemorrhage associated with anorectal source      Dorie Rank, MD 03/07/21 [phone]

## 2021-03-06 NOTE — ED Notes (Signed)
Chaperone for EDP for rectal exam. Pt tolerated well.  

## 2021-03-06 NOTE — H&P (Addendum)
History and Physical    [name] HMC:[id number] DOB: May 12, 1934 DOA: 03/06/2021  PCP: Unk Pinto, MD Patient coming from: Home.   I have personally briefly reviewed patient's old medical records in [place]  Chief Complaint: rectal bleeding since Monday.   HPI: Briana [name] is a 87 y.o. female with medical history significant of hypertension, hyperlipidemia, chronic diastolic heart failure, diverticulosis atrial fibrillation on eliquis, presented to ED with rectal bleeding started Monday, associated with some abdominal cramping only. No nausea, vomiting or diarrhea, no fevers or chills. No sob, chest pain, cough , orthopnea or PND.  She denies taking NSAIDS, and her last colonoscopy was in 2011 by Dr Fuller Plan.  She reports having 5 bloody bowel movements so far today .   ED Course: she underwent cbc showing a hemoglobin of 12.3 and creatinine of 1.7. INR is 1.2 . covid 19 screening test is negative. UA  Is abnormal with positive nitrite and 3+ leukocytes and moderate bacteria.   she was referred to [facility] for admission for evaluation of Lower GI Bleed.   Review of Systems: As per HPI otherwise "All others reviewed and are negative,"   Past Medical History:  Diagnosis Date  . Acute on chronic diastolic (congestive) heart failure ([place]) 11/21/2018  . Cancer ([place])    skin cancer  . DDD (degenerative disc disease)   . Diverticula of colon   . Diverticulitis   . DJD (degenerative joint disease)   . GERD (gastroesophageal reflux disease)    takes ranitidine  . Heart murmur   . History of hiatal hernia   . History of pneumonia   . Hx of irritable bowel syndrome   . Hyperlipidemia   . Hypertension   . Occasional tremors   . Osteoporosis   . Pre-diabetes   . Varicose veins   . Vitamin D deficiency     Past Surgical History:  Procedure Laterality Date  . APPENDECTOMY    . BREAST EXCISIONAL BIOPSY Right   . BREAST SURGERY Right    fluid drained from breast  .  CARDIOVERSION N/A 12/20/2018   Procedure: CARDIOVERSION;  Surgeon: Buford Dresser, MD;  Location: [facility] ENDOSCOPY;  Service: Cardiovascular;  Laterality: N/A;  . CARDIOVERSION N/A 06/16/2019   Procedure: CARDIOVERSION;  Surgeon: Fay Records, MD;  Location: [facility];  Service: Cardiovascular;  Laterality: N/A;  . COLONOSCOPY    . ESOPHAGOGASTRODUODENOSCOPY    . JOINT REPLACEMENT Left    left hip  . LUMBAR LAMINECTOMY/DECOMPRESSION MICRODISCECTOMY N/A 06/05/2015   Procedure: L3-L5 DECOMPRESSION ;  Surgeon: Melina Schools, MD;  Location: [facility];  Service: Orthopedics;  Laterality: N/A;  . OPEN REDUCTION INTERNAL FIXATION (ORIF) DISTAL RADIAL FRACTURE Right 01/10/2014   Procedure: OPEN REDUCTION INTERNAL FIXATION (ORIF) DISTAL RADIAL FRACTURE;  Surgeon: Linna Hoff, MD;  Location: [place];  Service: Orthopedics;  Laterality: Right;  . SPINAL CORD DECOMPRESSION  06/05/2015   L3 L 5  . THYROID SURGERY    . TONSILLECTOMY    . TUBAL LIGATION    . varicose veins stripped      Social History  reports that she has never smoked. She has never used smokeless tobacco. She reports that she does not drink alcohol and does not use drugs.  Allergies  Allergen Reactions  . Accupril [Quinapril Hcl] Cough  . Neosporin [Neomycin-Bacitracin Zn-Polymyx]     unknown  . Prednisone     Agitated and shaky  . Reglan [Metoclopramide] Other (See Comments)  tremors  . Tramadol Nausea And Vomiting  . Adhesive [Tape] Rash    Family History  Problem Relation Age of Onset  . Hypertension Mother   . Hyperlipidemia Mother   . Heart disease Sister   . Cancer Brother        prostate   Family history reviewed.   Prior to Admission medications   Medication Sig Start Date End Date Taking? Authorizing Provider  acetaminophen (TYLENOL) 500 MG tablet Take 500 mg by mouth. Take 500 to 1000 mg every 6 hours as needed.    [provider]  amiodarone (PACERONE) 200 MG tablet Take 1 tablet by mouth  once daily 02/24/21   Troy Sine, MD  amLODipine (NORVASC) 2.5 MG tablet Take 1 tablet (2.5 mg total) by mouth daily. 02/21/21   Lendon Colonel, NP  apixaban (ELIQUIS) 2.5 MG TABS tablet Take 1 tablet (2.5 mg total) by mouth 2 (two) times daily. 01/28/21   Hongalgi, Lenis Dickinson, MD  Ascorbic Acid (VITAMIN C) 1000 MG tablet Take 1,000 mg by mouth daily.     [provider]  Carboxymeth-Glycerin-Polysorb (REFRESH DIGITAL OP) Place 1 drop into the right eye daily as needed (dry eyes).    [provider]  cholecalciferol (VITAMIN D3) 25 MCG (1000 UT) tablet Take 1,000 Units by mouth 2 (two) times daily.    [provider]  Cyanocobalamin (B-12 SL) Place 1 tablet under the tongue daily.    [provider]  diazepam (VALIUM) 2 MG tablet Take  1 tablet 3 to 4  x /day  with Meals  for Tremor 03/05/21   Unk Pinto, MD  furosemide (LASIX) 80 MG tablet Take 1 tablet (80 mg total) by mouth 2 (two) times daily. 02/25/21   Troy Sine, MD  Misc Natural Products (OSTEO BI-FLEX JOINT SHIELD PO) Take 1 tablet by mouth 2 (two) times daily.     [provider]  Multiple Vitamins-Minerals (PRESERVISION AREDS 2 PO) Take 1 capsule by mouth 2 (two) times daily.    [provider]  Olopatadine HCl (PATADAY OP) Place 1 drop into the left eye daily.    [provider]  potassium chloride SA (KLOR-CON) 20 MEQ tablet Take 1 tablet (20 mEq total) by mouth 2 (two) times daily. 01/28/21   Hongalgi, Lenis Dickinson, MD  PROAIR HFA 108 [phone] Base) MCG/ACT inhaler Inhale 2 puffs into the lungs every 6 (six) hours as needed for wheezing or shortness of breath. 01/28/21   Hongalgi, Lenis Dickinson, MD  Pyridoxine HCl (VITAMIN B-6 PO) Take 1 capsule by mouth daily.    [provider]  zinc gluconate 50 MG tablet Take 50 mg by mouth daily.    [provider]    Physical Exam: Vitals:   03/06/21 1458 03/06/21 1500 03/06/21 1515 03/06/21 1612  BP: 125/74 120/74  118/65 (!) 144/72  Pulse: 74 71 70 86  Resp: (!) 22 20 (!) 21 18  Temp: 98.1 F (36.7 C)   98.5 F (36.9 C)  TempSrc: Oral   Oral  SpO2: 99% 95% 90% 97%  Weight:    63.6 kg  Height:    5\' 1"  (1.549 m)    Constitutional: NAD, calm, comfortable Vitals:   03/06/21 1458 03/06/21 1500 03/06/21 1515 03/06/21 1612  BP: 125/74 120/74 118/65 (!) 144/72  Pulse: 74 71 70 86  Resp: (!) 22 20 (!) 21 18  Temp: 98.1 F (36.7 C)   98.5 F (36.9 C)  TempSrc:  Oral   Oral  SpO2: 99% 95% 90% 97%  Weight:    63.6 kg  Height:    5\' 1"  (1.549 m)   Eyes: PERRL, lids and conjunctivae normal ENMT: Mucous membranes are moist. Posterior pharynx clear of any exudate or lesions.Normal dentition.  Neck: normal, supple, no masses, no thyromegaly Respiratory: clear to auscultation bilaterally, no wheezing, no crackles. Normal respiratory effort. No accessory muscle use.  Cardiovascular: Regular rate and rhythm,. No carotid bruits.  Abdomen: no tenderness, no masses palpated. Bowel sounds positive.  Musculoskeletal: no clubbing / cyanosis. No joint deformity upper and lower extremities. Good ROM, no contractures. Normal muscle tone.  Skin: no rashes, lesions, ulcers. No induration Neurologic: CN 2-12 grossly intact. Sensation intact, DTR normal. Strength 5/5 in all 4.  Psychiatric: Normal judgment and insight. Alert and oriented x 3. Normal mood.     Labs on Admission: I have personally reviewed following labs and imaging studies  CBC: Recent Labs  Lab 03/05/21 1458 03/06/21 1047  WBC 6.9 6.6  NEUTROABS 4,561 4.6  HGB 12.9 12.3  HCT 39.9 37.7  MCV 88.5 91.5  PLT 253 403    Basic Metabolic Panel: Recent Labs  Lab 03/05/21 1458 03/06/21 1047  NA 139 136  K 4.2 4.2  CL 100 101  CO2 27 25  GLUCOSE 127* 207*  BUN 31* 30*  CREATININE 1.71* 1.71*  CALCIUM 10.1  10.1 9.8  MG 2.2  --     GFR: Estimated Creatinine Clearance: 20.5 mL/min (A) (by C-G formula based on SCr of 1.71 mg/dL  (H)).  Liver Function Tests: Recent Labs  Lab 03/05/21 1458 03/06/21 1047  AST 27 22  ALT 22 17  ALKPHOS  --  80  BILITOT 0.6 0.6  PROT 6.7 6.5  ALBUMIN  --  3.9    Urine analysis:    Component Value Date/Time   COLORURINE YELLOW 03/05/2021 1641   APPEARANCEUR CLOUDY (A) 03/05/2021 1641   LABSPEC 1.010 03/05/2021 1641   PHURINE < OR = 5.0 03/05/2021 1641   GLUCOSEU NEGATIVE 03/05/2021 1641   HGBUR TRACE (A) 03/05/2021 1641   BILIRUBINUR NEGATIVE 01/22/2021 2230   KETONESUR NEGATIVE 03/05/2021 1641   PROTEINUR NEGATIVE 03/05/2021 1641   UROBILINOGEN 0.2 12/12/2012 1240   NITRITE POSITIVE (A) 03/05/2021 1641   LEUKOCYTESUR 3+ (A) 03/05/2021 1641    Radiological Exams on Admission: No results found.  EKG: not done.  Assessment/Plan Active Problems:   Essential hypertension   Type 2 diabetes mellitus with hyperlipidemia (HCC)   GERD   CKD stage 3 due to type 2 diabetes mellitus (HCC)   Chronic diastolic heart failure ([facility])   Lower GI bleed    Rectal bleeding/ 5 episodes since this am.  Possibly lower GI bleed in view of her history of diverticulosis and internal hemorrhoids.  Last colonoscopy in 2011 by Dr Fuller Plan Rectal bleed with some abd discomfort , without any nausea, vomiting or diarrhea.    Chronic atrial fibrillation: Rate controlled.  eliquis on hold for rectal bleed.   Chronic diastolic heart failure:  She appears to be compensated.     Hypertension:  Bp parameters are optimal.   Stage 3b CKD: Creatinine appears to be at baseline.     GERD; Continue with PPI.     DVT prophylaxis: scd's Code Status:   Full code Family Communication:  Daughter at bedside Disposition Plan:   Patient is from:  HOME  Anticipated DC to:  HOME  Anticipated DC date:  03/08/21  Anticipated DC barriers: Resolution of rectal bleed Consults called:  GI Admission status:  Inpatient/tele  Severity of Illness: The appropriate patient status for this patient  is INPATIENT. Inpatient status is judged to be reasonable and necessary in order to provide the required intensity of service to ensure the patient's safety. The patient's presenting symptoms, physical exam findings, and initial radiographic and laboratory data in the context of their chronic comorbidities is felt to place them at high risk for further clinical deterioration. Furthermore, it is not anticipated that the patient will be medically stable for discharge from the hospital within 2 midnights of admission.  "    * I certify that at the point of admission it is my clinical judgment that the patient will require inpatient hospital care spanning beyond 2 midnights from the point of admission due to high intensity of service, high risk for further deterioration and high frequency of surveillance required.*     Hosie Poisson MD [facility]  How to contact the [facility] Attending or Consulting provider [place] or covering provider during after hours [place], for this patient?   1. Check the care team in [facility] and look for a) attending/consulting [facility] provider listed and b) the [facility] team listed 2. Log into [email or web address] and use [place]'s universal password to access. If you do not have the password, please contact the hospital operator. 3. Locate the [facility] provider you are looking for under [facility] and page to a number that you can be directly reached. 4. If you still have difficulty reaching the provider, please page the [facility] (Director on Call) for the Hospitalists listed on amion for assistance.  03/06/2021, 7:08 PM

## 2021-03-06 NOTE — ED Notes (Signed)
Pt ambulatory with steady gait to restroom. Pt will provide urine specimen 

## 2021-03-06 NOTE — ED Notes (Signed)
ED Provider at bedside. 

## 2021-03-06 NOTE — ED Notes (Signed)
Patient ambulated with assistance to the restroom.

## 2021-03-06 NOTE — ED Triage Notes (Signed)
Pt arrives pov with daughter, reports diarrhea and bloody stools Monday night and this morning. Pt endorses lower abdominal pain. Pt endorses orthostatic dizziness. Pt endorses eloquis

## 2021-03-07 ENCOUNTER — Ambulatory Visit: Payer: Medicare Other | Admitting: Adult Health

## 2021-03-07 ENCOUNTER — Telehealth: Payer: Self-pay | Admitting: *Deleted

## 2021-03-07 ENCOUNTER — Telehealth: Payer: Self-pay

## 2021-03-07 DIAGNOSIS — K922 Gastrointestinal hemorrhage, unspecified: Secondary | ICD-10-CM

## 2021-03-07 DIAGNOSIS — I10 Essential (primary) hypertension: Secondary | ICD-10-CM

## 2021-03-07 DIAGNOSIS — K625 Hemorrhage of anus and rectum: Secondary | ICD-10-CM

## 2021-03-07 DIAGNOSIS — N183 Chronic kidney disease, stage 3 unspecified: Secondary | ICD-10-CM

## 2021-03-07 DIAGNOSIS — I5032 Chronic diastolic (congestive) heart failure: Secondary | ICD-10-CM

## 2021-03-07 DIAGNOSIS — E1122 Type 2 diabetes mellitus with diabetic chronic kidney disease: Secondary | ICD-10-CM

## 2021-03-07 LAB — COMPREHENSIVE METABOLIC PANEL
ALT: 19 U/L (ref 0–44)
AST: 24 U/L (ref 15–41)
Albumin: 3.1 g/dL — ABNORMAL LOW (ref 3.5–5.0)
Alkaline Phosphatase: 72 U/L (ref 38–126)
Anion gap: 8 (ref 5–15)
BUN: 21 mg/dL (ref 8–23)
CO2: 24 mmol/L (ref 22–32)
Calcium: 9.3 mg/dL (ref 8.9–10.3)
Chloride: 107 mmol/L (ref 98–111)
Creatinine, Ser: 1.53 mg/dL — ABNORMAL HIGH (ref 0.44–1.00)
GFR, Estimated: 33 mL/min — ABNORMAL LOW (ref >60)
Glucose, Bld: 135 mg/dL — ABNORMAL HIGH (ref 70–99)
Potassium: 4.1 mmol/L (ref 3.5–5.1)
Sodium: 139 mmol/L (ref 135–145)
Total Bilirubin: 0.7 mg/dL (ref 0.3–1.2)
Total Protein: 5.5 g/dL — ABNORMAL LOW (ref 6.5–8.1)

## 2021-03-07 LAB — URINALYSIS, ROUTINE W REFLEX MICROSCOPIC
Bilirubin Urine: NEGATIVE
Glucose, UA: NEGATIVE
Ketones, ur: NEGATIVE
Nitrite: POSITIVE — AB
Protein, ur: NEGATIVE
Specific Gravity, Urine: 1.01 (ref 1.001–1.035)
Squamous Epithelial / HPF: NONE SEEN /HPF (ref <=5)
WBC, UA: >=60 /HPF — AB (ref 0–5)
pH: <=5 (ref 5.0–8.0)

## 2021-03-07 LAB — MICROALBUMIN / CREATININE URINE RATIO
Creatinine, Urine: 46 mg/dL (ref 20–275)
Microalb Creat Ratio: 54 mcg/mg creat — ABNORMAL HIGH (ref <30)
Microalb, Ur: 2.5 mg/dL

## 2021-03-07 LAB — URINE CULTURE
MICRO NUMBER:: 11793721
SPECIMEN QUALITY:: ADEQUATE

## 2021-03-07 LAB — HEMOGLOBIN AND HEMATOCRIT, BLOOD
HCT: 34.1 % — ABNORMAL LOW (ref 36.0–46.0)
HCT: 31.8 % — ABNORMAL LOW (ref 36.0–46.0)
Hemoglobin: 10.8 g/dL — ABNORMAL LOW (ref 12.0–15.0)
Hemoglobin: 9.9 g/dL — ABNORMAL LOW (ref 12.0–15.0)

## 2021-03-07 LAB — MICROSCOPIC MESSAGE

## 2021-03-07 MED ORDER — ACETAMINOPHEN 325 MG PO TABS
650.0000 mg | ORAL_TABLET | ORAL | Status: DC | PRN
  Administered 2021-03-07 – 2021-03-08 (×2): 650 mg via ORAL
  Filled 2021-03-07 (×2): qty 2

## 2021-03-07 MED ORDER — HYDROCORTISONE ACETATE 25 MG RE SUPP
25.0000 mg | Freq: Two times a day (BID) | RECTAL | Status: DC
  Administered 2021-03-07 – 2021-03-10 (×6): 25 mg via RECTAL
  Filled 2021-03-07 (×6): qty 1

## 2021-03-07 MED ORDER — POLYETHYLENE GLYCOL 3350 17 G PO PACK
17.0000 g | PACK | Freq: Every day | ORAL | Status: DC
  Administered 2021-03-07 – 2021-03-10 (×3): 17 g via ORAL
  Filled 2021-03-07 (×3): qty 1

## 2021-03-07 NOTE — Telephone Encounter (Signed)
Patient has been scheduled for a 3-week follow up with Ellouise Newer on Friday, 03/28/21 at 11:30 AM. Reminder in epic for patient to come in for lab work a couple of days prior to her appointment. Will mail letter to patient's home with appointment information and lab reminder.  Lab order and reminder in epic.

## 2021-03-07 NOTE — Consult Note (Addendum)
Consultation  Referring Provider: Dr. Karleen Hampshire     Primary Care Physician:  Unk Pinto, MD Primary Gastroenterologist:  Dr. Fuller Plan       Reason for Consultation: Rectal bleeding            HPI:   Briana [name] is a 87 y.o. female with a past medical history significant for hypertension, hyperlipidemia, chronic diastolic heart failure (7/98/9211 echo with an LVEF 55%), diverticulosis, A. fib on Eliquis and others listed below, who presented to the ER on 03/06/2021 with rectal bleeding.    Today, the patient describes that she started with bright red blood per rectum on Monday, 03/04/2019 with a bowel movement.  Initially, she had some lower abdominal cramping as well, which was relieved after she passed a stool.  She was slightly worried because it looked like a lot of bright red blood in the toilet, but it did not occur again until Thursday.  Tells me that on Wednesday she passed a normal brown stool with no blood.  Then on Thursday she had 3 bowel movements with further blood and presented to the ER.  She has had none since she has arrived or overnight.  Tells me that she has no further abdominal pain.  Does describe a history of chronic constipation telling me that she "tries to eat my vegetables", but last week was so constipated on 2 occasions that she had to strain very hard.  Denies use of any regular laxatives.    Denies fever, chills, weight loss, change in bowel habits, rectal pain, nausea, vomiting, dyspnea on exertion or palpitations.  ED course: CBC with hemoglobin of 12.3, creatinine 1.7, INR 1.2, UA with positive nitrate and 3+ leukocytes and moderate bacteria  GI history: 01/21/2010 colonoscopy done by Dr. Fuller Plan for hematochezia and left lower quadrant pain with moderate diverticulosis in the sigmoid to descending colon and internal hemorrhoids, bleeding was presumed from hemorrhoids, repeat recommended 10 years  Past Medical History:  Diagnosis Date  . Acute on chronic  diastolic (congestive) heart failure ([place]) 11/21/2018  . Cancer ([place])    skin cancer  . DDD (degenerative disc disease)   . Diverticula of colon   . Diverticulitis   . DJD (degenerative joint disease)   . GERD (gastroesophageal reflux disease)    takes ranitidine  . Heart murmur   . History of hiatal hernia   . History of pneumonia   . Hx of irritable bowel syndrome   . Hyperlipidemia   . Hypertension   . Occasional tremors   . Osteoporosis   . Pre-diabetes   . Varicose veins   . Vitamin D deficiency     Past Surgical History:  Procedure Laterality Date  . APPENDECTOMY    . BREAST EXCISIONAL BIOPSY Right   . BREAST SURGERY Right    fluid drained from breast  . CARDIOVERSION N/A 12/20/2018   Procedure: CARDIOVERSION;  Surgeon: Buford Dresser, MD;  Location[facility] ENDOSCOPY;  Service: Cardiovascular;  Laterality: N/A;  . CARDIOVERSION N/A 06/16/2019   Procedure: CARDIOVERSION;  Surgeon: Fay Records, MD;  Location: [facility];  Service: Cardiovascular;  Laterality: N/A;  . COLONOSCOPY    . ESOPHAGOGASTRODUODENOSCOPY    . JOINT REPLACEMENT Left    left hip  . LUMBAR LAMINECTOMY/DECOMPRESSION MICRODISCECTOMY N/A 06/05/2015   Procedure: L3-L5 DECOMPRESSION ;  Surgeon: Melina Schools, MD;  Location: [place];  Service: Orthopedics;  Laterality: N/A;  . OPEN REDUCTION INTERNAL FIXATION (ORIF) DISTAL RADIAL FRACTURE Right 01/10/2014  Procedure: OPEN REDUCTION INTERNAL FIXATION (ORIF) DISTAL RADIAL FRACTURE;  Surgeon: Linna Hoff, MD;  Location: [place];  Service: Orthopedics;  Laterality: Right;  . SPINAL CORD DECOMPRESSION  06/05/2015   L3 L 5  . THYROID SURGERY    . TONSILLECTOMY    . TUBAL LIGATION    . varicose veins stripped      Family History  Problem Relation Age of Onset  . Hypertension Mother   . Hyperlipidemia Mother   . Heart disease Sister   . Cancer Brother        prostate     Social History   Tobacco Use  . Smoking status: Never Smoker  . Smokeless  tobacco: Never Used  Substance Use Topics  . Alcohol use: No  . Drug use: No    Prior to Admission medications   Medication Sig Start Date End Date Taking? Authorizing Provider  acetaminophen (TYLENOL) 500 MG tablet Take 500 mg by mouth every 6 (six) hours as needed for moderate pain.   Yes [provider]  amiodarone (PACERONE) 200 MG tablet Take 1 tablet by mouth once daily 02/24/21  Yes Troy Sine, MD  amLODipine (NORVASC) 2.5 MG tablet Take 1 tablet (2.5 mg total) by mouth daily. 02/21/21  Yes Lendon Colonel, NP  apixaban (ELIQUIS) 2.5 MG TABS tablet Take 1 tablet (2.5 mg total) by mouth 2 (two) times daily. 01/28/21  Yes Hongalgi, Lenis Dickinson, MD  Carboxymeth-Glycerin-Polysorb (REFRESH DIGITAL OP) Place 1 drop into the right eye daily as needed (dry eyes).   Yes [provider]  cholecalciferol (VITAMIN D3) 25 MCG (1000 UT) tablet Take 1,000 Units by mouth 2 (two) times daily.   Yes [provider]  Cyanocobalamin (B-12 SL) Place 1 tablet under the tongue daily.   Yes [provider]  furosemide (LASIX) 80 MG tablet Take 1 tablet (80 mg total) by mouth 2 (two) times daily. 02/25/21  Yes Troy Sine, MD  Misc Natural Products (OSTEO BI-FLEX JOINT SHIELD PO) Take 1 tablet by mouth 2 (two) times daily.    Yes [provider]  Multiple Vitamins-Minerals (PRESERVISION AREDS 2 PO) Take 1 capsule by mouth 2 (two) times daily.   Yes [provider]  Olopatadine HCl (PATADAY OP) Place 1 drop into the left eye daily.   Yes [provider]  potassium chloride SA (KLOR-CON) 20 MEQ tablet Take 1 tablet (20 mEq total) by mouth 2 (two) times daily. 01/28/21  Yes Hongalgi, Lenis Dickinson, MD  PROAIR HFA 108 [phone] Base) MCG/ACT inhaler Inhale 2 puffs into the lungs every 6 (six) hours as needed for wheezing or shortness of breath. 01/28/21  Yes Hongalgi, Lenis Dickinson, MD  Pyridoxine HCl (VITAMIN B-6 PO) Take 1 capsule by mouth daily.   Yes [provider]  zinc gluconate 50 MG tablet Take 50 mg by mouth daily.   Yes [provider]    Current Facility-Administered Medications  Medication Dose Route Frequency Provider Last Rate Last Admin  . 0.9 %  sodium chloride infusion   Intravenous Continuous Hosie Poisson, MD 75 mL/hr at 03/07/21 0625 New Bag at 03/07/21 4665  . albuterol (VENTOLIN HFA) 108 (90 Base) MCG/ACT inhaler 2 puff  2 puff Inhalation Q6H PRN Hosie Poisson, MD      . amiodarone (PACERONE) tablet 200 mg  200 mg Oral Daily Hosie Poisson, MD      . cefTRIAXone (ROCEPHIN) 1 g in sodium chloride 0.9 % 100 mL IVPB  1 g Intravenous Q24H Hosie Poisson, MD 200 mL/hr at 03/06/21 1814 1 g at 03/06/21 1814  . cholecalciferol (VITAMIN D3) tablet 1,000 Units  1,000 Units Oral Daily Hosie Poisson, MD      . diazepam (VALIUM) tablet 2 mg  2 mg Oral Q8H PRN Hosie Poisson, MD      . pantoprazole (PROTONIX) injection 40 mg  40 mg Intravenous Q24H Hosie Poisson, MD   40 mg at 03/06/21 1807    Allergies as of 03/06/2021 - Review Complete 03/06/2021  Allergen Reaction Noted  . Accupril [quinapril hcl] Cough 11/16/2013  . Neosporin [neomycin-bacitracin zn-polymyx]  05/30/2015  . Prednisone  01/10/2014  . Reglan [metoclopramide] Other (See Comments) 11/16/2013  . Tramadol Nausea And Vomiting 05/30/2015  . Adhesive [tape] Rash 12/12/2012     Review of Systems:    Constitutional: No weight loss, fever or chills Skin: No rash  Cardiovascular: No chest pain Respiratory: No SOB  Gastrointestinal: See HPI and otherwise negative Genitourinary: No dysuria  Neurological: No headache, dizziness or syncope Musculoskeletal: No new muscle or joint pain Hematologic: No bruising Psychiatric: No history of depression or anxiety    Physical Exam:  Vital signs in last 24 hours: Temp:  [97.8 F (36.6 C)-98.6 F (37 C)] 97.8 F (36.6 C) (04/22 0351) Pulse Rate:  [62-86] 62 (04/22 0351) Resp:  [14-22] 16 (04/22 0351) BP:  (102-145)/(57-78) 102/62 (04/22 0351) SpO2:  [90 %-99 %] 98 % (04/22 0351) Weight:  [63.5 kg-63.6 kg] 63.6 kg (04/21 1612) Last BM Date: 03/06/21 General:   Pleasant Elderly Caucasian female appears to be in NAD, Well developed, Well nourished, alert and cooperative Head:  Normocephalic and atraumatic. Eyes:   PEERL, EOMI. No icterus. Conjunctiva pink. Ears:  Normal auditory acuity. Neck:  Supple Throat: Oral cavity and pharynx without inflammation, swelling or lesion.  Lungs: Respirations even and unlabored. Lungs clear to auscultation bilaterally.   No wheezes, crackles, or rhonchi.  Heart: Normal S1, S2. No MRG. Regular rate and rhythm. No peripheral edema, cyanosis or pallor.  Abdomen:  Soft, nondistended, nontender. No rebound or guarding. Normal bowel sounds. No appreciable masses or hepatomegaly. Rectal:  External: one small hemorrhoid tag; Internal: fullness, no mass, no blood Msk:  Symmetrical without gross deformities. Peripheral pulses intact.  Extremities:  Without edema, no deformity or joint abnormality.  Neurologic:  Alert and  oriented x4;  grossly normal neurologically. Skin:   Dry and intact without significant lesions or rashes. Psychiatric: Demonstrates good judgement and reason without abnormal affect or behaviors.   LAB RESULTS: Recent Labs    03/05/21 1458 03/06/21 1047 03/06/21 1928 03/06/21 2122  WBC 6.9 6.6  --  5.7  HGB 12.9 12.3 10.6* 10.5*  HCT 39.9 37.7 33.3* 33.1*  PLT 253 234  --  216   BMET Recent Labs    03/05/21 1458 03/06/21 1047  NA 139 136  K 4.2 4.2  CL 100 101  CO2 27 25  GLUCOSE 127* 207*  BUN 31* 30*  CREATININE 1.71* 1.71*  CALCIUM 10.1  10.1 9.8   LFT Recent Labs    03/06/21 1047  PROT 6.5  ALBUMIN 3.9  AST 22  ALT 17  ALKPHOS 80  BILITOT 0.6   PT/INR Recent Labs    03/06/21 1047  LABPROT 15.6*  INR 1.2    Impression / Plan:   Impression: 1.  Rectal bleeding: 1 occurrence on Monday, 03/03/2021 and then 3  occurrences on Thursday, 03/06/2021, none since arrival, all with  a bowel movement, last colonoscopy in 2011 with hemorrhoids done for the same reason, hemoglobin has dropped slightly from 12.3-10.6 but is holding stable; most likely hemorrhoids versus diverticular bleed 2.  A. fib on Eliquis: Eliquis on hold since 03/06/2021 3.  Chronic diastolic heart failure 4.  Stage III CKD 5.  GERD  Plan: 1.  Continue to monitor hemoglobin every 8 hours while hospitalized. 2.  Right now no plans for immediate colonoscopy given that everything seems to have slowed and she does have internal hemorrhoids on exam today. 3.  Restart Hydrocortisone suppositories twice daily for the next week. 4.  Will allow patient to have a regular diet today 5.  Encouraged the patient to start MiraLAX on a daily basis, will add some in now 6.  Would consider whether or not patient needs to remain on Eliquis long-term. 7.  Did call patient's daughter Jacqlyn Larsen at her request and discussed plans.  Answered all of her questions. 8.  We will arrange for patient office visit with [place] in the next 3 to 4 weeks to recheck hemoglobin. 9.  Please await any further recommendations from Dr. Silverio Decamp later today please alert our service if she has any acute signs of excessive GI bleeding.  Thank you for your kind consultation, we will continue to follow.  Lavone Nian [facility]  03/07/2021, 10:01 AM   Attending physician's note   I have taken a history, examined the patient and reviewed the chart. I agree with the Advanced Practitioner's note, impression and recommendations.  87 year old very pleasant female with history of CHF, A. fib on Eliquis admitted with small-volume bright red blood per rectum, likely etiology bleeding from internal hemorrhoids Use Anusol/hydrocortisone suppositories twice daily Monitor hemoglobin  If no further bleeding and hemoglobin remained stable, okay to discharge home tomorrow We will arrange for GI office  follow-up on discharge   The patient was provided an opportunity to ask questions and all were answered. The patient agreed with the plan and demonstrated an understanding of the instructions.  Damaris Hippo , MD [phone]

## 2021-03-07 NOTE — TOC Initial Note (Signed)
Transition of Care [facility]) - Initial/Assessment Note    Patient Details  Name: KHUSHI ZUPKO MRN: [id number] Date of Birth: [birth year]  [facility]) CM/SW Contact:    Verdell Carmine, RN Phone Number: 03/07/2021, 9:23 AM  Clinical Narrative:                 Admitted with lower GI bleed.  Currently with [facility] for Home Health needs. Will need HH reordered before discharge. CM will follow for needs.  Expected Discharge Plan: Home/Self Care Barriers to Discharge: Continued Medical Work up   Patient Goals and CMS Choice        Expected Discharge Plan and Services Expected Discharge Plan: Home/Self Care   Discharge Planning Services: CM Consult   Living arrangements for the past 2 months: Single Family Home                                      Prior Living Arrangements/Services Living arrangements for the past 2 months: Single Family Home   Patient language and need for interpreter reviewed:: Yes        Need for Family Participation in Patient Care: Yes (Comment) Care giver support system in place?: Yes (comment)   Criminal Activity/Legal Involvement Pertinent to Current Situation/Hospitalization: No - Comment as needed  Activities of Daily Living Home Assistive Devices/Equipment: Cane (specify quad or straight),Walker (specify type) ADL Screening (condition at time of admission) Patient's cognitive ability adequate to safely complete daily activities?: Yes Is the patient deaf or have difficulty hearing?: No Does the patient have difficulty seeing, even when wearing glasses/contacts?: No Does the patient have difficulty concentrating, remembering, or making decisions?: No Patient able to express need for assistance with ADLs?: Yes Does the patient have difficulty dressing or bathing?: No Independently performs ADLs?: Yes (appropriate for developmental age) Does the patient have difficulty walking or climbing stairs?: No Weakness of Legs:  None Weakness of Arms/Hands: None  Permission Sought/Granted                  Emotional Assessment       Orientation: : Oriented to Self,Oriented to Place Alcohol / Substance Use: Not Applicable Psych Involvement: No (comment)  Admission diagnosis:  Lower GI bleed [K92.2] Gastrointestinal hemorrhage associated with anorectal source [K62.5] Patient Active Problem List   Diagnosis Date Noted  . Lower GI bleed 03/06/2021  . Hyperparathyroidism due to renal insufficiency (HCC) ([place] [facility] 140 on 03/05/2021  03/06/2021  . Aortic atherosclerosis ([place]) by Chest CTA on 11/21/2018.  03/05/2021  . 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 Acquired thrombophilia ([place]) 11/15/2019  . Chronic diastolic heart failure ([place]) 04/03/2019  . CKD stage 3 due to type 2 diabetes mellitus ([place]) 02/23/2018  . Bilateral sensorineural hearing loss 07/02/2017  . Obesity (BMI 30.0-34.9) 09/05/2015  . Lumbar stenosis 06/05/2015  . GERD 11/20/2013  . Osteopenia 11/20/2013  . Essential hypertension 11/19/2013  . Type 2 diabetes mellitus with hyperlipidemia ([place]) 11/19/2013  . Vitamin D deficiency 11/19/2013  . Diffuse cystic mastopathy 11/19/2013   PCP:  Unk Pinto, MD Pharmacy:   [facility] [place] - [address] [place] [address] Phone: [phone] Fax: [phone]     Social Determinants of Health (SDOH) Interventions    Readmission Risk Interventions No flowsheet data found.

## 2021-03-07 NOTE — Progress Notes (Signed)
PROGRESS NOTE    Briana [name]  GOT:[id number] DOB: 1934-05-03 DOA: 03/06/2021 PCP: Unk Pinto, MD    Chief Complaint  Patient presents with  . Rectal Bleeding    Brief Narrative:   Briana [name] is a 87 y.o. female with medical history significant of hypertension, hyperlipidemia, chronic diastolic heart failure, diverticulosis atrial fibrillation on eliquis, presented to ED with rectal bleeding started Monday, associated with some abdominal cramping only. On admission her hemoglobin was around 12 dropped to 10.5.  GI consulted for recommendations.    Assessment & Plan:   Active Problems:   Essential hypertension   Type 2 diabetes mellitus with hyperlipidemia (HCC)   GERD   CKD stage 3 due to type 2 diabetes mellitus (HCC)   Chronic diastolic heart failure (HCC)   Lower GI bleed   Rectal bleeding probably lower GI bleed in view of her diverticulosis and internal hemorrhoids.  Her bleeding has improved .  Hemoglobin on admission is 12, dropped to 10.5.  Continue to follow the hemoglobin .  Transfuse to keep hemoglobin greater than 7.  Was started on clears and GI advanced to regular diet today.  Continue with PPI daily , await further recommendations from GI.  Last colonoscopy was in 2011 by Dr Fuller Plan.     Chronic atrial fibrillation:  Rate controlled and anti coagulation on hold.    Chronic diastolic heart failure:  She appears compensated .    Hypertension:  Well controlled BP parameters.    Stage 3 b CKD: Creatinine appears to be better than baseline.  Will d/c IV fluids in 24 hours. Marland Kitchen    GERD: Continue with PPI.    DVT prophylaxis: SCD'S Code Status: (Full code. Family Communication: none at bedside.  Disposition:   Status is: Inpatient  Remains inpatient appropriate because:Ongoing diagnostic testing needed not appropriate for outpatient work up   Dispo: The patient is from: Home              Anticipated d/c is to: Home               Patient currently is not medically stable to d/c.   Difficult to place patient No       Consultants:   GI.    Procedures: none  Antimicrobials:  Antibiotics Given (last 72 hours)    Date/Time Action Medication Dose Rate   03/06/21 1814 New Bag/Given   cefTRIAXone (ROCEPHIN) 1 g in sodium chloride 0.9 % 100 mL IVPB 1 g 200 mL/hr          Subjective: No abd pain or nausea, only on 1bm last night, which had blood in it.  No vomiting.   Objective: Vitals:   03/06/21 2353 03/07/21 0001 03/07/21 0351 03/07/21 0950  BP: (!) 107/57  102/62   Pulse: 81  62   Resp: 18  16   Temp: 98.1 F (36.7 C)  97.8 F (36.6 C)   TempSrc: Oral  Oral   SpO2: 90% 97% 98% 96%  Weight:      Height:        Intake/Output Summary (Last 24 hours) at 03/07/2021 1339 Last data filed at 03/07/2021 1000 Gross per 24 hour  Intake 765 ml  Output 200 ml  Net 565 ml   Filed Weights   03/06/21 1030 03/06/21 1612  Weight: 63.5 kg 63.6 kg    Examination:  General exam: Appears calm and comfortable  Respiratory system: Clear to auscultation. Respiratory effort normal. Cardiovascular system: S1 &  S2 heard, RRR. No JVD, No pedal edema. Gastrointestinal system: Abdomen is nondistended, soft and nontender.  Normal bowel sounds heard. Central nervous system: Alert and oriented. No focal neurological deficits. Extremities: Symmetric 5 x 5 power. Skin: No rashes, lesions or ulcers Psychiatry:Mood & affect appropriate.     Data Reviewed: I have personally reviewed following labs and imaging studies  CBC: Recent Labs  Lab 03/05/21 1458 03/06/21 1047 03/06/21 1928 03/06/21 2122 03/07/21 1013  WBC 6.9 6.6  --  5.7  --   NEUTROABS 4,561 4.6  --   --   --   HGB 12.9 12.3 10.6* 10.5* 10.8*  HCT 39.9 37.7 33.3* 33.1* 34.1*  MCV 88.5 91.5  --  91.7  --   PLT 253 234  --  216  --     Basic Metabolic Panel: Recent Labs  Lab 03/05/21 1458 03/06/21 1047 03/07/21 1013  NA 139 136 139  K  4.2 4.2 4.1  CL 100 101 107  CO2 27 25 24   GLUCOSE 127* 207* 135*  BUN 31* 30* 21  CREATININE 1.71* 1.71* 1.53*  CALCIUM 10.1  10.1 9.8 9.3  MG 2.2  --   --     GFR: Estimated Creatinine Clearance: 23 mL/min (A) (by C-G formula based on SCr of 1.53 mg/dL (H)).  Liver Function Tests: Recent Labs  Lab 03/05/21 1458 03/06/21 1047 03/07/21 1013  AST 27 22 24   ALT 22 17 19   ALKPHOS  --  80 72  BILITOT 0.6 0.6 0.7  PROT 6.7 6.5 5.5*  ALBUMIN  --  3.9 3.1*    CBG: No results for input(s): GLUCAP in the last 168 hours.   Recent Results (from the past 240 hour(s))  Urine Culture     Status: Abnormal   Collection Time: 03/05/21  4:41 PM   Specimen: Urine  Result Value Ref Range Status   MICRO NUMBER: [id number]  Final   SPECIMEN QUALITY: Adequate  Final   Sample Source URINE  Final   STATUS: FINAL  Final   ISOLATE 1: Escherichia coli (A)  Final    Comment: Greater than 100,000 CFU/mL of Escherichia coli      Susceptibility   Escherichia coli - URINE CULTURE, REFLEX    AMOX/CLAVULANIC 4 Sensitive     AMPICILLIN <=2 Sensitive     AMPICILLIN/SULBACTAM <=2 Sensitive     CEFAZOLIN* <=4 Not Reportable      * For infections other than uncomplicated UTIcaused by E. coli, K. pneumoniae or P. mirabilis:Cefazolin is resistant if MIC > or = 8 mcg/mL.(Distinguishing susceptible versus intermediatefor isolates with MIC < or = 4 mcg/mL requiresadditional testing.)For uncomplicated UTI caused by E. coli,K. pneumoniae or P. mirabilis: Cefazolin issusceptible if MIC <32 mcg/mL and predictssusceptible to the oral agents cefaclor, cefdinir,cefpodoxime, cefprozil, cefuroxime, cephalexinand loracarbef.    CEFEPIME <=1 Sensitive     CEFTRIAXONE <=1 Sensitive     CIPROFLOXACIN <=0.25 Sensitive     LEVOFLOXACIN <=0.12 Sensitive     ERTAPENEM <=0.5 Sensitive     GENTAMICIN <=1 Sensitive     IMIPENEM <=0.25 Sensitive     NITROFURANTOIN <=16 Sensitive     PIP/TAZO <=4 Sensitive     TOBRAMYCIN <=1  Sensitive     TRIMETH/SULFA* <=20 Sensitive      * For infections other than uncomplicated UTIcaused by E. coli, K. pneumoniae or P. mirabilis:Cefazolin is resistant if MIC > or = 8 mcg/mL.(Distinguishing susceptible versus intermediatefor isolates with MIC < or = 4 mcg/mL  requiresadditional testing.)For uncomplicated UTI caused by E. coli,K. pneumoniae or P. mirabilis: Cefazolin issusceptible if MIC <32 mcg/mL and predictssusceptible to the oral agents cefaclor, cefdinir,cefpodoxime, cefprozil, cefuroxime, cephalexinand loracarbef.Legend:S = Susceptible  I = IntermediateR = Resistant  NS = Not susceptible* = Not tested  NR = Not reported**NN = See antimicrobic comments  MICROSCOPIC MESSAGE     Status: None   Collection Time: 03/05/21  4:41 PM  Result Value Ref Range Status   Note   Final    Comment: This urine was analyzed for the presence of WBC,  RBC, bacteria, casts, and other formed elements.  Only those elements seen were reported. . .   Resp Panel by RT-PCR (Flu A&B, Covid) Nasopharyngeal Swab     Status: None   Collection Time: 03/06/21  1:42 PM   Specimen: Nasopharyngeal Swab; Nasopharyngeal(NP) swabs in vial transport medium  Result Value Ref Range Status   SARS Coronavirus 2 by RT PCR NEGATIVE NEGATIVE Final    Comment: (NOTE) SARS-CoV-2 target nucleic acids are NOT DETECTED.  The SARS-CoV-2 RNA is generally detectable in upper respiratory specimens during the acute phase of infection. The lowest concentration of SARS-CoV-2 viral copies this assay can detect is 138 copies/mL. A negative result does not preclude SARS-Cov-2 infection and should not be used as the sole basis for treatment or other patient management decisions. A negative result may occur with  improper specimen collection/handling, submission of specimen other than nasopharyngeal swab, presence of viral mutation(s) within the areas targeted by this assay, and inadequate number of viral copies(<138 copies/mL). A  negative result must be combined with clinical observations, patient history, and epidemiological information. The expected result is Negative.  Fact Sheet for Patients:  EntrepreneurPulse.com.au  Fact Sheet for Healthcare Providers:  IncredibleEmployment.be  This test is no t yet approved or cleared by the [place] FDA and  has been authorized for detection and/or diagnosis of SARS-CoV-2 by FDA under an Emergency Use Authorization (EUA). This EUA will remain  in effect (meaning this test can be used) for the duration of the COVID-19 declaration under Section 564(b)(1) of the Act, 21 U.S.C.section 360bbb-3(b)(1), unless the authorization is terminated  or revoked sooner.       Influenza A by PCR NEGATIVE NEGATIVE Final   Influenza B by PCR NEGATIVE NEGATIVE Final    Comment: (NOTE) The Xpert Xpress SARS-CoV-2/FLU/RSV plus assay is intended as an aid in the diagnosis of influenza from Nasopharyngeal swab specimens and should not be used as a sole basis for treatment. Nasal washings and aspirates are unacceptable for Xpert Xpress SARS-CoV-2/FLU/RSV testing.  Fact Sheet for Patients: EntrepreneurPulse.com.au  Fact Sheet for Healthcare Providers: IncredibleEmployment.be  This test is not yet approved or cleared by the [place] FDA and has been authorized for detection and/or diagnosis of SARS-CoV-2 by FDA under an Emergency Use Authorization (EUA). This EUA will remain in effect (meaning this test can be used) for the duration of the COVID-19 declaration under Section 564(b)(1) of the Act, 21 U.S.C. section 360bbb-3(b)(1), unless the authorization is terminated or revoked.  Performed at [facility]          Radiology Studies: No results found.      Scheduled Meds: . amiodarone  200 mg Oral Daily  . cholecalciferol  1,000 Units Oral Daily  . hydrocortisone  25 mg  Rectal BID  . pantoprazole (PROTONIX) IV  40 mg Intravenous Q24H  . polyethylene glycol  17 g Oral Daily   Continuous  Infusions: . sodium chloride 75 mL/hr at 03/07/21 0625  . cefTRIAXone (ROCEPHIN)  IV 1 g (03/06/21 1814)     LOS: 1 day        Hosie Poisson, MD [facility]   To contact the attending provider between 7A-7P or the covering provider during after hours 7P-7A, please log into the web site [email or web address] and access using universal [place] password for that web site. If you do not have the password, please call the hospital operator.  03/07/2021, 1:39 PM

## 2021-03-07 NOTE — Progress Notes (Signed)
Patient's oxygen saturation 88-90% while resting in bed, patient states she "feels like I'm not getting enough air". Patient placed on 2L oxygen via nasal cannula, oxygen saturations now 97%. No further complaints per patient at this time, will continue to monitor.

## 2021-03-07 NOTE — Telephone Encounter (Signed)
Last labs faxed to [facility].

## 2021-03-07 NOTE — Telephone Encounter (Signed)
-----   Message from [place], [place] sent at 03/07/2021 10:46 AM EDT ----- Regarding: follow up Can you get patient an office visit with me in the next 3 to 4 weeks to follow-up GI bleed.  If possible to me arrange for recheck of hemoglobin the day before or a few days before.  She is still in the hospital now, but will likely be discharged tomorrow.  Thanks-JLL

## 2021-03-08 LAB — HEMOGLOBIN AND HEMATOCRIT, BLOOD
HCT: 29.2 % — ABNORMAL LOW (ref 36.0–46.0)
Hemoglobin: 9.1 g/dL — ABNORMAL LOW (ref 12.0–15.0)

## 2021-03-08 NOTE — Progress Notes (Addendum)
Progress Note  Chief Complaint:    Rectal bleeding    Attending physician's note   I have taken an interval history, reviewed the chart and examined the patient. I agree with the Advanced Practitioner's note, impression and recommendations.   Mild decline in hemoglobin Has not had recurrent episodes of rectal bleeding Most likely etiology small-volume hemorrhage from internal hemorrhoids but cannot exclude neoplastic lesion Patient and family do not feel comfortable going home without diagnostic evaluation Will plan to proceed with flexible sigmoidoscopy tomorrow to further evaluate N.p.o. after midnight  The risks and benefits as well as alternatives of endoscopic procedure(s) have been discussed and reviewed. All questions answered. The patient agrees to proceed.    Briana [name] , MD [phone]     ASSESSMENT / PLAN:    87 yo female with PMH of HTN, hyperlipidemia, chronic diastolic heart failure, AFib on Eliquis. Admitted with rectal bleeding and anemia.   # Painless rectal bleeding on Eliquis. Treating her for presumed hemorroidal bleed. No further bleeding overnight . Hgb continues to drift ( 9.1).  --Discussed diagnostic options with patient and her two daughters over the phone. We discussed colonoscopy vrs flex sig vrs watch and watch and continue to treat empirically for hemorrhoids. Daughters not comfortable discharging patient without workup but not in favor of a full colonoscopy. Plan is for flex sig tomorrow with conscious sedation.         SUBJECTIVE:   Feels fine. No further bleeding    OBJECTIVE:    Scheduled inpatient medications:  . amiodarone  200 mg Oral Daily  . cholecalciferol  1,000 Units Oral Daily  . hydrocortisone  25 mg Rectal BID  . pantoprazole (PROTONIX) IV  40 mg Intravenous Q24H  . polyethylene glycol  17 g Oral Daily   Continuous inpatient infusions:  . sodium chloride 75 mL/hr at 03/07/21 2203  . cefTRIAXone (ROCEPHIN)   IV Stopped (03/07/21 1900)   PRN inpatient medications: acetaminophen, albuterol, diazepam  Vital signs in last 24 hours: Temp:  [98 F (36.7 C)-98.1 F (36.7 C)] 98 F (36.7 C) (04/23 0454) Pulse Rate:  [76-78] 78 (04/23 0454) Resp:  [16-17] 16 (04/23 0454) BP: (111-120)/(51-70) 120/70 (04/23 0454) SpO2:  [89 %-96 %] 93 % (04/23 0454) Weight:  [65.4 kg] 65.4 kg (04/23 0454) Last BM Date: 03/06/21  Intake/Output Summary (Last 24 hours) at 03/08/2021 1104 Last data filed at 03/08/2021 1031 Gross per 24 hour  Intake 2420.66 ml  Output --  Net 2420.66 ml     Physical Exam:  . General: Alert female in NAD . Heart:  Regular rate . No lower extremity edema . Pulmonary: Normal respiratory effort . Abdomen: Soft, nondistended, nontender. Normal bowel sounds.  . Neurologic: Alert and oriented . Psych: Pleasant. Cooperative.   Filed Weights   03/06/21 1030 03/06/21 1612 03/08/21 0454  Weight: 63.5 kg 63.6 kg 65.4 kg    Intake/Output from previous day: 04/22 0701 - 04/23 0700 In: 2180.7 [P.O.:480; I.V.:1500.7; IV Piggyback:200] Out: 200 [Urine:200] Intake/Output this shift: Total I/O In: 240 [P.O.:240] Out: -     Lab Results: Recent Labs    03/05/21 1458 03/06/21 1047 03/06/21 1928 03/06/21 2122 03/07/21 1013 03/07/21 1817 03/08/21 0134  WBC 6.9 6.6  --  5.7  --   --   --   HGB 12.9 12.3   < > 10.5* 10.8* 9.9* 9.1*  HCT 39.9 37.7   < > 33.1* 34.1* 31.8* 29.2*  PLT 253 234  --  216  --   --   --    < > = values in this interval not displayed.   BMET Recent Labs    03/05/21 1458 03/06/21 1047 03/07/21 1013  NA 139 136 139  K 4.2 4.2 4.1  CL 100 101 107  CO2 27 25 24   GLUCOSE 127* 207* 135*  BUN 31* 30* 21  CREATININE 1.71* 1.71* 1.53*  CALCIUM 10.1  10.1 9.8 9.3   LFT Recent Labs    03/07/21 1013  PROT 5.5*  ALBUMIN 3.1*  AST 24  ALT 19  ALKPHOS 72  BILITOT 0.7   PT/INR Recent Labs    03/06/21 1047  LABPROT 15.6*  INR 1.2   Hepatitis  Panel No results for input(s): HEPBSAG, HCVAB, HEPAIGM, HEPBIGM in the last 72 hours.  No results found.  PREVIOUS ENDOSCOPIES:      Active Problems:   Essential hypertension   Type 2 diabetes mellitus with hyperlipidemia (HCC)   GERD   CKD stage 3 due to type 2 diabetes mellitus (HCC)   Chronic diastolic heart failure (HCC)   Lower GI bleed   Rectal bleeding     LOS: 2 days   [name] ,NP 03/08/2021, 11:04 AM

## 2021-03-08 NOTE — Progress Notes (Signed)
PROGRESS NOTE    Briana [name]  ZOX:[id number] DOB: [birth year] DOA: 03/06/2021 PCP: Unk Pinto, MD    Chief Complaint  Patient presents with  . Rectal Bleeding    Brief Narrative:   Briana [name] is a 87 y.o. female with medical history significant of hypertension, hyperlipidemia, chronic diastolic heart failure, diverticulosis atrial fibrillation on eliquis, presented to ED with rectal bleeding started Monday, associated with some abdominal cramping only. On admission her hemoglobin was around 12 dropped to 10.5 to 9.  GI consulted for recommendations.    Assessment & Plan:   Active Problems:   Essential hypertension   Type 2 diabetes mellitus with hyperlipidemia (HCC)   GERD   CKD stage 3 due to type 2 diabetes mellitus (HCC)   Chronic diastolic heart failure (HCC)   Lower GI bleed   Rectal bleeding   Rectal bleeding probably lower GI bleed in view of her diverticulosis and internal hemorrhoids.  No bleeding or bowel movement since yesterday  Hemoglobin on admission is 12, dropped to 10.5 to 9.1.  Continue to follow the hemoglobin .  Transfuse to keep hemoglobin greater than 7.  Was started on clears and GI advanced to regular diet. No NAUSEA, vomiting or bowel movement.  Continue with PPI daily ,  Last colonoscopy was in 2011 by Dr Fuller Plan.  GI recommends sig tomorrow.     Chronic atrial fibrillation:  Rate controlled and anti coag on hold for rectal bleeding.    Chronic diastolic heart failure:  She appears compensated .    Hypertension:  Well controlled.    Stage 3 b CKD: Creatinine appears to be better than baseline.     GERD: Continue with PPI.    DVT prophylaxis: SCD'S Code Status: (Full code. Family Communication: none at bedside.  Disposition:   Status is: Inpatient  Remains inpatient appropriate because:Ongoing diagnostic testing needed not appropriate for outpatient work up   Dispo: The patient is from: Home               Anticipated d/c is to: Home              Patient currently is not medically stable to d/c.   Difficult to place patient No       Consultants:   GI.    Procedures: none  Antimicrobials:  Antibiotics Given (last 72 hours)    Date/Time Action Medication Dose Rate   03/06/21 1814 New Bag/Given   cefTRIAXone (ROCEPHIN) 1 g in sodium chloride 0.9 % 100 mL IVPB 1 g 200 mL/hr   03/07/21 1715 New Bag/Given   cefTRIAXone (ROCEPHIN) 1 g in sodium chloride 0.9 % 100 mL IVPB 1 g 200 mL/hr         Subjective: No chest pain or sob, no nausea, vomiting or abd pain. No BM yet.  Objective: Vitals:   03/07/21 0950 03/07/21 1423 03/07/21 2216 03/08/21 0454  BP:  (!) 111/51  120/70  Pulse:  76  78  Resp:  17  16  Temp:  98.1 F (36.7 C)  98 F (36.7 C)  TempSrc:  Oral  Oral  SpO2: 96% 96% (!) 89% 93%  Weight:    65.4 kg  Height:        Intake/Output Summary (Last 24 hours) at 03/08/2021 1346 Last data filed at 03/08/2021 1031 Gross per 24 hour  Intake 2420.66 ml  Output --  Net 2420.66 ml   Filed Weights   03/06/21 1030 03/06/21 1612 03/08/21 0454  Weight: 63.5 kg 63.6 kg 65.4 kg    Examination:  General exam: alert and comfortable, no distress noted.  Respiratory system: clear to auscultation, no wheezing or rhonchi.  Cardiovascular system: S1 & S2 heard, irregular, no JVD, no pedal edema.  Gastrointestinal system: Abdomen is soft, NT ND BS+ Central nervous system: ALERT  And oriented, non focal.  Extremities: no pedal edema.  Skin: no rashes seen.  Psychiatry: mood is appropriate.     Data Reviewed: I have personally reviewed following labs and imaging studies  CBC: Recent Labs  Lab 03/05/21 1458 03/06/21 1047 03/06/21 1928 03/06/21 2122 03/07/21 1013 03/07/21 1817 03/08/21 0134  WBC 6.9 6.6  --  5.7  --   --   --   NEUTROABS 4,561 4.6  --   --   --   --   --   HGB 12.9 12.3 10.6* 10.5* 10.8* 9.9* 9.1*  HCT 39.9 37.7 33.3* 33.1* 34.1* 31.8* 29.2*  MCV  88.5 91.5  --  91.7  --   --   --   PLT 253 234  --  216  --   --   --     Basic Metabolic Panel: Recent Labs  Lab 03/05/21 1458 03/06/21 1047 03/07/21 1013  NA 139 136 139  K 4.2 4.2 4.1  CL 100 101 107  CO2 27 25 24   GLUCOSE 127* 207* 135*  BUN 31* 30* 21  CREATININE 1.71* 1.71* 1.53*  CALCIUM 10.1  10.1 9.8 9.3  MG 2.2  --   --     GFR: Estimated Creatinine Clearance: 23.3 mL/min (A) (by C-G formula based on SCr of 1.53 mg/dL (H)).  Liver Function Tests: Recent Labs  Lab 03/05/21 1458 03/06/21 1047 03/07/21 1013  AST 27 22 24   ALT 22 17 19   ALKPHOS  --  80 72  BILITOT 0.6 0.6 0.7  PROT 6.7 6.5 5.5*  ALBUMIN  --  3.9 3.1*    CBG: No results for input(s): GLUCAP in the last 168 hours.   Recent Results (from the past 240 hour(s))  Urine Culture     Status: Abnormal   Collection Time: 03/05/21  4:41 PM   Specimen: Urine  Result Value Ref Range Status   MICRO NUMBER: [id number]  Final   SPECIMEN QUALITY: Adequate  Final   Sample Source URINE  Final   STATUS: FINAL  Final   ISOLATE 1: Escherichia coli (A)  Final    Comment: Greater than 100,000 CFU/mL of Escherichia coli      Susceptibility   Escherichia coli - URINE CULTURE, REFLEX    AMOX/CLAVULANIC 4 Sensitive     AMPICILLIN <=2 Sensitive     AMPICILLIN/SULBACTAM <=2 Sensitive     CEFAZOLIN* <=4 Not Reportable      * For infections other than uncomplicated UTIcaused by E. coli, K. pneumoniae or P. mirabilis:Cefazolin is resistant if MIC > or = 8 mcg/mL.(Distinguishing susceptible versus intermediatefor isolates with MIC < or = 4 mcg/mL requiresadditional testing.)For uncomplicated UTI caused by E. coli,K. pneumoniae or P. mirabilis: Cefazolin issusceptible if MIC <32 mcg/mL and predictssusceptible to the oral agents cefaclor, cefdinir,cefpodoxime, cefprozil, cefuroxime, cephalexinand loracarbef.    CEFEPIME <=1 Sensitive     CEFTRIAXONE <=1 Sensitive     CIPROFLOXACIN <=0.25 Sensitive     LEVOFLOXACIN  <=0.12 Sensitive     ERTAPENEM <=0.5 Sensitive     GENTAMICIN <=1 Sensitive     IMIPENEM <=0.25 Sensitive     NITROFURANTOIN <=16 Sensitive  PIP/TAZO <=4 Sensitive     TOBRAMYCIN <=1 Sensitive     TRIMETH/SULFA* <=20 Sensitive      * For infections other than uncomplicated UTIcaused by E. coli, K. pneumoniae or P. mirabilis:Cefazolin is resistant if MIC > or = 8 mcg/mL.(Distinguishing susceptible versus intermediatefor isolates with MIC < or = 4 mcg/mL requiresadditional testing.)For uncomplicated UTI caused by E. coli,K. pneumoniae or P. mirabilis: Cefazolin issusceptible if MIC <32 mcg/mL and predictssusceptible to the oral agents cefaclor, cefdinir,cefpodoxime, cefprozil, cefuroxime, cephalexinand loracarbef.Legend:S = Susceptible  I = IntermediateR = Resistant  NS = Not susceptible* = Not tested  NR = Not reported**NN = See antimicrobic comments  MICROSCOPIC MESSAGE     Status: None   Collection Time: 03/05/21  4:41 PM  Result Value Ref Range Status   Note   Final    Comment: This urine was analyzed for the presence of WBC,  RBC, bacteria, casts, and other formed elements.  Only those elements seen were reported. . .   Resp Panel by RT-PCR (Flu A&B, Covid) Nasopharyngeal Swab     Status: None   Collection Time: 03/06/21  1:42 PM   Specimen: Nasopharyngeal Swab; Nasopharyngeal(NP) swabs in vial transport medium  Result Value Ref Range Status   SARS Coronavirus 2 by RT PCR NEGATIVE NEGATIVE Final    Comment: (NOTE) SARS-CoV-2 target nucleic acids are NOT DETECTED.  The SARS-CoV-2 RNA is generally detectable in upper respiratory specimens during the acute phase of infection. The lowest concentration of SARS-CoV-2 viral copies this assay can detect is 138 copies/mL. A negative result does not preclude SARS-Cov-2 infection and should not be used as the sole basis for treatment or other patient management decisions. A negative result may occur with  improper specimen  collection/handling, submission of specimen other than nasopharyngeal swab, presence of viral mutation(s) within the areas targeted by this assay, and inadequate number of viral copies(<138 copies/mL). A negative result must be combined with clinical observations, patient history, and epidemiological information. The expected result is Negative.  Fact Sheet for Patients:  EntrepreneurPulse.com.au  Fact Sheet for Healthcare Providers:  IncredibleEmployment.be  This test is no t yet approved or cleared by the [place] FDA and  has been authorized for detection and/or diagnosis of SARS-CoV-2 by FDA under an Emergency Use Authorization (EUA). This EUA will remain  in effect (meaning this test can be used) for the duration of the COVID-19 declaration under Section 564(b)(1) of the Act, 21 U.S.C.section 360bbb-3(b)(1), unless the authorization is terminated  or revoked sooner.       Influenza A by PCR NEGATIVE NEGATIVE Final   Influenza B by PCR NEGATIVE NEGATIVE Final    Comment: (NOTE) The Xpert Xpress SARS-CoV-2/FLU/RSV plus assay is intended as an aid in the diagnosis of influenza from Nasopharyngeal swab specimens and should not be used as a sole basis for treatment. Nasal washings and aspirates are unacceptable for Xpert Xpress SARS-CoV-2/FLU/RSV testing.  Fact Sheet for Patients: EntrepreneurPulse.com.au  Fact Sheet for Healthcare Providers: IncredibleEmployment.be  This test is not yet approved or cleared by the [place] FDA and has been authorized for detection and/or diagnosis of SARS-CoV-2 by FDA under an Emergency Use Authorization (EUA). This EUA will remain in effect (meaning this test can be used) for the duration of the COVID-19 declaration under Section 564(b)(1) of the Act, 21 U.S.C. section 360bbb-3(b)(1), unless the authorization is terminated or revoked.  Performed at [facility]  Radiology Studies: No results found.      Scheduled Meds: . amiodarone  200 mg Oral Daily  . cholecalciferol  1,000 Units Oral Daily  . hydrocortisone  25 mg Rectal BID  . pantoprazole (PROTONIX) IV  40 mg Intravenous Q24H  . polyethylene glycol  17 g Oral Daily   Continuous Infusions: . sodium chloride 75 mL/hr at 03/08/21 1226  . cefTRIAXone (ROCEPHIN)  IV Stopped (03/07/21 1900)     LOS: 2 days        Hosie Poisson, MD [facility]   To contact the attending provider between 7A-7P or the covering provider during after hours 7P-7A, please log into the web site [email or web address] and access using universal [place] password for that web site. If you do not have the password, please call the hospital operator.  03/08/2021, 1:46 PM

## 2021-03-08 NOTE — Evaluation (Signed)
Physical Therapy Evaluation Patient Details Name: Briana [name] MRN: [id number] DOB: May 23, 1934 Today's Date: 03/08/2021   History of Present Illness  Pt adm 4/21 adm with rectal bleeding likely lower GI bleed. Work up in progress. PMH - HTN, DM, CKD, CHF.  Clinical Impression  Pt presents to PT with slight decr in activity tolerance and mobility due to illness and inactivity. Pt very motivated to maximize independence. Expect she will make good progress and be able to return home with HHPT and intermittent assist.     Follow Up Recommendations Home health PT;Supervision - Intermittent    Equipment Recommendations  None recommended by PT    Recommendations for Other Services       Precautions / Restrictions Precautions Precautions: Fall      Mobility  Bed Mobility               General bed mobility comments: Pt up in chair    Transfers Overall transfer level: Needs assistance Equipment used: None;Rolling walker (2 wheeled) Transfers: Sit to/from Stand Sit to Stand: Supervision         General transfer comment: Assist for safety and lines  Ambulation/Gait Ambulation/Gait assistance: Supervision Gait Distance (Feet): 300 Feet Assistive device: Rolling walker (2 wheeled) Gait Pattern/deviations: Step-through pattern;Decreased stride length Gait velocity: decr Gait velocity interpretation: 1.31 - 2.62 ft/sec, indicative of limited community ambulator General Gait Details: Assist for safety and lines. Pt reports increased ease with ambulation as distance incr and she "loosened up"  Stairs            Wheelchair Mobility    Modified Rankin (Stroke Patients Only)       Balance Overall balance assessment: Needs assistance Sitting-balance support: No upper extremity supported;Feet supported Sitting balance-Leahy Scale: Good     Standing balance support: No upper extremity supported;During functional activity Standing balance-Leahy Scale: Fair                                Pertinent Vitals/Pain Pain Assessment: No/denies pain    Home Living Family/patient expects to be discharged to:: Private residence Living Arrangements: Alone Available Help at Discharge: Available PRN/intermittently;Family Type of Home: House Home Access: Stairs to enter Entrance Stairs-Rails: Right Entrance Stairs-Number of Steps: 2 Home Layout: One level Home Equipment: Walker - 2 wheels;Cane - quad Additional Comments: One daughter from out of town will be staying with her initially at dc.    Prior Function Level of Independence: Independent with assistive device(s)         Comments: Recently using rolling walker     Hand Dominance   Dominant Hand: Right    Extremity/Trunk Assessment   Upper Extremity Assessment Upper Extremity Assessment: Overall WFL for tasks assessed    Lower Extremity Assessment Lower Extremity Assessment: Generalized weakness       Communication   Communication: No difficulties (hearing aids)  Cognition Arousal/Alertness: Awake/alert Behavior During Therapy: WFL for tasks assessed/performed Overall Cognitive Status: Within Functional Limits for tasks assessed                                        General Comments General comments (skin integrity, edema, etc.): VSS on RA    Exercises     Assessment/Plan    PT Assessment Patient needs continued PT services  PT Problem List Decreased strength;Decreased activity tolerance;Decreased  balance;Decreased mobility       PT Treatment Interventions DME instruction;Gait training;Functional mobility training;Therapeutic activities;Therapeutic exercise;Balance training;Patient/family education    PT Goals (Current goals can be found in the Care Plan section)  Acute Rehab PT Goals Patient Stated Goal: Return home PT Goal Formulation: With patient Time For Goal Achievement: 03/22/21 Potential to Achieve Goals: Good    Frequency Min  3X/week   Barriers to discharge Decreased caregiver support Lives alone    Co-evaluation               AM-PAC PT "6 Clicks" Mobility  Outcome Measure Help needed turning from your back to your side while in a flat bed without using bedrails?: A Little Help needed moving from lying on your back to sitting on the side of a flat bed without using bedrails?: A Little Help needed moving to and from a bed to a chair (including a wheelchair)?: A Little Help needed standing up from a chair using your arms (e.g., wheelchair or bedside chair)?: A Little Help needed to walk in hospital room?: A Little Help needed climbing 3-5 steps with a railing? : A Little 6 Click Score: 18    End of Session   Activity Tolerance: Patient tolerated treatment well Patient left: in chair;with call bell/phone within reach   PT Visit Diagnosis: Other abnormalities of gait and mobility (R26.89);Muscle weakness (generalized) (M62.81)    Time: 3491-7915 PT Time Calculation (min) (ACUTE ONLY): 21 min   Charges:   PT Evaluation $PT Eval Moderate Complexity: [place] Pager [phone] Office [place] 03/08/2021, 3:06 PM

## 2021-03-08 NOTE — H&P (View-Only) (Signed)
Progress Note  Chief Complaint:    Rectal bleeding    Attending physician's note   I have taken an interval history, reviewed the chart and examined the patient. I agree with the Advanced Practitioner's note, impression and recommendations.   Mild decline in hemoglobin Has not had recurrent episodes of rectal bleeding Most likely etiology small-volume hemorrhage from internal hemorrhoids but cannot exclude neoplastic lesion Patient and family do not feel comfortable going home without diagnostic evaluation Will plan to proceed with flexible sigmoidoscopy tomorrow to further evaluate N.p.o. after midnight  The risks and benefits as well as alternatives of endoscopic procedure(s) have been discussed and reviewed. All questions answered. The patient agrees to proceed.    Damaris Hippo , MD [phone]     ASSESSMENT / PLAN:    87 yo female with PMH of HTN, hyperlipidemia, chronic diastolic heart failure, AFib on Eliquis. Admitted with rectal bleeding and anemia.   # Painless rectal bleeding on Eliquis. Treating her for presumed hemorroidal bleed. No further bleeding overnight . Hgb continues to drift ( 9.1).  --Discussed diagnostic options with patient and her two daughters over the phone. We discussed colonoscopy vrs flex sig vrs watch and watch and continue to treat empirically for hemorrhoids. Daughters not comfortable discharging patient without workup but not in favor of a full colonoscopy. Plan is for flex sig tomorrow with conscious sedation.         SUBJECTIVE:   Feels fine. No further bleeding    OBJECTIVE:    Scheduled inpatient medications:  . amiodarone  200 mg Oral Daily  . cholecalciferol  1,000 Units Oral Daily  . hydrocortisone  25 mg Rectal BID  . pantoprazole (PROTONIX) IV  40 mg Intravenous Q24H  . polyethylene glycol  17 g Oral Daily   Continuous inpatient infusions:  . sodium chloride 75 mL/hr at 03/07/21 2203  . cefTRIAXone (ROCEPHIN)   IV Stopped (03/07/21 1900)   PRN inpatient medications: acetaminophen, albuterol, diazepam  Vital signs in last 24 hours: Temp:  [98 F (36.7 C)-98.1 F (36.7 C)] 98 F (36.7 C) (04/23 0454) Pulse Rate:  [76-78] 78 (04/23 0454) Resp:  [16-17] 16 (04/23 0454) BP: (111-120)/(51-70) 120/70 (04/23 0454) SpO2:  [89 %-96 %] 93 % (04/23 0454) Weight:  [65.4 kg] 65.4 kg (04/23 0454) Last BM Date: 03/06/21  Intake/Output Summary (Last 24 hours) at 03/08/2021 1104 Last data filed at 03/08/2021 1031 Gross per 24 hour  Intake 2420.66 ml  Output --  Net 2420.66 ml     Physical Exam:  . General: Alert female in NAD . Heart:  Regular rate . No lower extremity edema . Pulmonary: Normal respiratory effort . Abdomen: Soft, nondistended, nontender. Normal bowel sounds.  . Neurologic: Alert and oriented . Psych: Pleasant. Cooperative.   Filed Weights   03/06/21 1030 03/06/21 1612 03/08/21 0454  Weight: 63.5 kg 63.6 kg 65.4 kg    Intake/Output from previous day: 04/22 0701 - 04/23 0700 In: 2180.7 [P.O.:480; I.V.:1500.7; IV Piggyback:200] Out: 200 [Urine:200] Intake/Output this shift: Total I/O In: 240 [P.O.:240] Out: -     Lab Results: Recent Labs    03/05/21 1458 03/06/21 1047 03/06/21 1928 03/06/21 2122 03/07/21 1013 03/07/21 1817 03/08/21 0134  WBC 6.9 6.6  --  5.7  --   --   --   HGB 12.9 12.3   < > 10.5* 10.8* 9.9* 9.1*  HCT 39.9 37.7   < > 33.1* 34.1* 31.8* 29.2*  PLT 253 234  --  216  --   --   --    < > = values in this interval not displayed.   BMET Recent Labs    03/05/21 1458 03/06/21 1047 03/07/21 1013  NA 139 136 139  K 4.2 4.2 4.1  CL 100 101 107  CO2 27 25 24   GLUCOSE 127* 207* 135*  BUN 31* 30* 21  CREATININE 1.71* 1.71* 1.53*  CALCIUM 10.1  10.1 9.8 9.3   LFT Recent Labs    03/07/21 1013  PROT 5.5*  ALBUMIN 3.1*  AST 24  ALT 19  ALKPHOS 72  BILITOT 0.7   PT/INR Recent Labs    03/06/21 1047  LABPROT 15.6*  INR 1.2   Hepatitis  Panel No results for input(s): HEPBSAG, HCVAB, HEPAIGM, HEPBIGM in the last 72 hours.  No results found.  PREVIOUS ENDOSCOPIES:      Active Problems:   Essential hypertension   Type 2 diabetes mellitus with hyperlipidemia (HCC)   GERD   CKD stage 3 due to type 2 diabetes mellitus (HCC)   Chronic diastolic heart failure (HCC)   Lower GI bleed   Rectal bleeding     LOS: 2 days   [name] ,NP 03/08/2021, 11:04 AM

## 2021-03-09 ENCOUNTER — Encounter (HOSPITAL_COMMUNITY): Payer: Self-pay | Admitting: Internal Medicine

## 2021-03-09 ENCOUNTER — Encounter (HOSPITAL_COMMUNITY)
Admission: EM | Disposition: A | Discharge status: Home Health | Payer: Self-pay | Source: Home / Self Care | Attending: Internal Medicine

## 2021-03-09 DIAGNOSIS — K5731 Diverticulosis of large intestine without perforation or abscess with bleeding: Secondary | ICD-10-CM

## 2021-03-09 HISTORY — PX: HEMOSTASIS CLIP PLACEMENT: SHX6857

## 2021-03-09 HISTORY — PX: FLEXIBLE SIGMOIDOSCOPY: SHX5431

## 2021-03-09 LAB — CBC WITH DIFFERENTIAL/PLATELET
Abs Immature Granulocytes: 0.06 10*3/uL (ref 0.00–0.07)
Basophils Absolute: 0 10*3/uL (ref 0.0–0.1)
Basophils Relative: 1 %
Eosinophils Absolute: 0.5 10*3/uL (ref 0.0–0.5)
Eosinophils Relative: 11 %
HCT: 28.6 % — ABNORMAL LOW (ref 36.0–46.0)
Hemoglobin: 9.1 g/dL — ABNORMAL LOW (ref 12.0–15.0)
Immature Granulocytes: 1 %
Lymphocytes Relative: 13 %
Lymphs Abs: 0.7 10*3/uL (ref 0.7–4.0)
MCH: 29.7 pg (ref 26.0–34.0)
MCHC: 31.8 g/dL (ref 30.0–36.0)
MCV: 93.5 fL (ref 80.0–100.0)
Monocytes Absolute: 0.7 10*3/uL (ref 0.1–1.0)
Monocytes Relative: 14 %
Neutro Abs: 3.1 10*3/uL (ref 1.7–7.7)
Neutrophils Relative %: 60 %
Platelets: 218 10*3/uL (ref 150–400)
RBC: 3.06 MIL/uL — ABNORMAL LOW (ref 3.87–5.11)
RDW: 15.5 % (ref 11.5–15.5)
WBC: 5.1 10*3/uL (ref 4.0–10.5)
nRBC: 0 % (ref 0.0–0.2)

## 2021-03-09 SURGERY — SIGMOIDOSCOPY, FLEXIBLE
Anesthesia: Moderate Sedation

## 2021-03-09 MED ORDER — MIDAZOLAM HCL (PF) 10 MG/2ML IJ SOLN
INTRAMUSCULAR | Status: DC | PRN
  Administered 2021-03-09: 1 mg via INTRAVENOUS
  Administered 2021-03-09 (×3): 2 mg via INTRAVENOUS
  Administered 2021-03-09: 1 mg via INTRAVENOUS

## 2021-03-09 MED ORDER — FENTANYL CITRATE (PF) 100 MCG/2ML IJ SOLN
INTRAMUSCULAR | Status: AC
  Filled 2021-03-09: qty 4

## 2021-03-09 MED ORDER — AMOXICILLIN-POT CLAVULANATE 500-125 MG PO TABS
1.0000 | ORAL_TABLET | Freq: Two times a day (BID) | ORAL | Status: DC
  Administered 2021-03-09 – 2021-03-10 (×2): 500 mg via ORAL
  Filled 2021-03-09 (×2): qty 1

## 2021-03-09 MED ORDER — AMOXICILLIN-POT CLAVULANATE 875-125 MG PO TABS: 1.0000 | ORAL_TABLET | Freq: Two times a day (BID) | ORAL | Status: DC

## 2021-03-09 MED ORDER — MIDAZOLAM HCL (PF) 5 MG/ML IJ SOLN
INTRAMUSCULAR | Status: AC
  Filled 2021-03-09: qty 2

## 2021-03-09 MED ORDER — FENTANYL CITRATE (PF) 100 MCG/2ML IJ SOLN
INTRAMUSCULAR | Status: DC | PRN
  Administered 2021-03-09 (×4): 25 ug via INTRAVENOUS

## 2021-03-09 NOTE — Op Note (Signed)
[facility] Patient Name: Briana [name] Procedure Date : 03/09/2021 MRN: [id number] Attending MD: Mauri Pole , MD Date of Birth: Mar 23, 1934 CSN: [id number] Age: 87 Admit Type: Inpatient Procedure:                Flexible Sigmoidoscopy Indications:              Hematochezia Providers:                Mauri Pole, MD, Particia Nearing, RN, Ladona Ridgel, Technician Referring MD:              Medicines:                Fentanyl 100 micrograms IV, Midazolam 8 mg IV Complications:            No immediate complications. Estimated Blood Loss:     Estimated blood loss was minimal. Procedure:                Pre-Anesthesia Assessment:                           - Prior to the procedure, a History and Physical                            was performed, and patient medications and                            allergies were reviewed. The patient's tolerance of                            previous anesthesia was also reviewed. The risks                            and benefits of the procedure and the sedation                            options and risks were discussed with the patient.                            All questions were answered, and informed consent                            was obtained. Prior Anticoagulants: The patient                            last took Eliquis (apixaban) 3 days prior to the                            procedure. ASA Grade Assessment: III - A patient                            with severe systemic disease. After reviewing the  risks and benefits, the patient was deemed in                            satisfactory condition to undergo the procedure.                           After obtaining informed consent, the scope was                            passed under direct vision. The GIF-H190 ([id number])                            Olympus gastroscope was introduced through the anus                             and advanced to the the left transverse colon. The                            flexible sigmoidoscopy was somewhat difficult due                            to inadequate bowel prep. Successful completion of                            the procedure was aided by lavage. The quality of                            the bowel preparation was adequate. Scope In: 9:17:27 AM Scope Out: 9:49:34 AM Total Procedure Duration: 0 hours 32 minutes 7 seconds  Findings:      The perianal examination was normal.      Multiple small and large-mouthed diverticula were found in the sigmoid       colon and descending colon. There was evidence of an impacted       diverticulum. There was evidence of recent bleeding from the       diverticular opening. There was evidence of past bleeding from the       diverticular opening with adherent clot, no active bleeding. For       hemostasis and location marking, two hemostatic clips were successfully       placed (MR conditional). There was no bleeding at the end of the       procedure.      Non-bleeding internal hemorrhoids were found during retroflexion. The       hemorrhoids were medium-sized. Impression:               - Severe diverticulosis in the sigmoid colon and                            descending colon. There was evidence of an impacted                            diverticulum. There was evidence of recent bleeding  from the diverticular opening. There was evidence                            of past bleeding from the diverticular opening.                            Clips (MR conditional) were placed. Potential                            source of hematochezia is diverticular bleed                           - Non-bleeding internal hemorrhoids.                           - No specimens collected. Moderate Sedation:      Moderate (conscious) sedation was administered by the endoscopy nurse       and supervised by the endoscopist.  The patient's oxygen saturation,       heart rate, blood pressure and response to care were monitored. Total       physician intraservice time was 36 minutes. Recommendation:           - Clear liquid diet today and advance to soft diet                            tomorrow.                           - Continue present medications.                           - Resume Eliquis (apixaban) at prior dose in 5                            days. Refer to managing physician for further                            adjustment of therapy. Advise patient to discuss                            with PMD/cardiology need for long term                            anticoagulation and potential risks to make a                            decision if she needs to stay on anticoagulation in                            the long term.                           - Monitor Hgb and transfuse if below 7                           -  Avoid NSAID's                           - Ok to discharge home tomorrow if no further                            bleeding and hgb remains stable                           - If develops acute episode of hematochezia, please                            obtain CTA and consult IR for embolization                           - GI is available if needed, please call with any                            questions Procedure Code(s):        --- Professional ---                           [phone], Sigmoidoscopy, flexible; with control of                            bleeding, any method                           99153, Moderate sedation; each additional 15                            minutes intraservice time                           G0500, Moderate sedation services provided by the                            same physician or other qualified health care                            professional performing a gastrointestinal                            endoscopic service that sedation supports,                             requiring the presence of an independent trained                            observer to assist in the monitoring of the                            patient's level of consciousness and physiological                            status; initial 15  minutes of intra-service time;                            patient age 40 years or older (additional time may                            be reported with [phone], as appropriate) Diagnosis Code(s):        --- Professional ---                           K64.8, Other hemorrhoids                           K57.31, Diverticulosis of large intestine without                            perforation or abscess with bleeding                           K92.1, Melena (includes Hematochezia) CPT copyright 2019 American Medical Association. All rights reserved. The codes documented in this report are preliminary and upon coder review may  be revised to meet current compliance requirements. Mauri Pole, MD 03/09/2021 10:15:29 AM This report has been signed electronically. Number of Addenda: 0

## 2021-03-09 NOTE — Progress Notes (Signed)
PHARMACY NOTE:  ANTIMICROBIAL RENAL DOSAGE ADJUSTMENT  Current antimicrobial regimen includes a mismatch between antimicrobial dosage and estimated renal function.  As per policy approved by the Pharmacy & Therapeutics and Medical Executive Committees, the antimicrobial dosage will be adjusted accordingly.  Current antimicrobial dosage:  Augmentin 875-125 mg every 12 hours  Indication: UTI  Renal Function:  Estimated Creatinine Clearance: 23.3 mL/min (A) (by C-G formula based on SCr of 1.53 mg/dL (H)). []      On intermittent HD, scheduled: []      On CRRT    Antimicrobial dosage has been changed to:  Augmentin 500-125 mg every 12 hours   Additional comments:   Thank you for allowing pharmacy to be a part of this patient's care.  Antonietta Jewel, PharmD, [place] Clinical Pharmacist  Phone: [phone] 03/09/2021 4:37 PM  Please check AMION for all [place] phone numbers After 10:00 PM, call Progress [phone]

## 2021-03-09 NOTE — Interval H&P Note (Signed)
History and Physical Interval Note:  03/09/2021 8:18 AM  Briana [name]  has presented today for surgery, with the diagnosis of rectal bleeding.  The various methods of treatment have been discussed with the patient and family. After consideration of risks, benefits and other options for treatment, the patient has consented to  Procedure(s): FLEXIBLE SIGMOIDOSCOPY (N/A) as a surgical intervention.  The patient's history has been reviewed, patient examined, no change in status, stable for surgery.  I have reviewed the patient's chart and labs.  Questions were answered to the patient's satisfaction.     Khayden Herzberg

## 2021-03-09 NOTE — Progress Notes (Signed)
PROGRESS NOTE    Briana [name]  PZW:[id number] DOB: [birth year] DOA: 03/06/2021 PCP: Unk Pinto, MD    Chief Complaint  Patient presents with  . Rectal Bleeding    Brief Narrative:   Briana [name] is a 87 y.o. female with medical history significant of hypertension, hyperlipidemia, chronic diastolic heart failure, diverticulosis atrial fibrillation on eliquis, presented to ED with rectal bleeding started Monday, associated with some abdominal cramping only. On admission her hemoglobin was around 12 dropped to 10.5 to 9.  GI consulted for recommendations.    Assessment & Plan:   Active Problems:   Essential hypertension   Type 2 diabetes mellitus with hyperlipidemia (HCC)   GERD   CKD stage 3 due to type 2 diabetes mellitus (HCC)   Chronic diastolic heart failure (HCC)   Lower GI bleed   Rectal bleeding   Diverticular hemorrhage   Rectal bleeding probably lower GI bleed in view of her diverticulosis and internal hemorrhoids.  No bleeding or bowel movement since yesterday  Hemoglobin on admission is 12, dropped to 10.5 to 9.1.  Continue to follow the hemoglobin .  Transfuse to keep hemoglobin greater than 7.  Continue with PPI daily ,  Last colonoscopy was in 2011 by Dr Fuller Plan.  GI suggested sigmoidoscopy which showed Severe diverticulosis in the sigmoid colon and descending colon. There was evidence of an impacted diverticulum. There was evidence of recent bleeding from the diverticular opening. There was evidence of past bleeding from the diverticular opening. Recommend clear liquid diet today and repeat H&H in am.     Chronic atrial fibrillation:  Rate controlled and anti coag on hold for rectal bleeding.     Chronic diastolic heart failure:  She appears compensated . Will stop IV fluids.    Hypertension:  Well controlled.    Stage 3 b CKD: Creatinine appears to be better than baseline.     GERD: Continue with PPI.   E coli UTI:  Pan sensitive,  will transition to oral augmentin today    DVT prophylaxis: SCD'S Code Status: (Full code. Family Communication: none at bedside.  Disposition:   Status is: Inpatient  Remains inpatient appropriate because:Ongoing diagnostic testing needed not appropriate for outpatient work up   Dispo: The patient is from: Home              Anticipated d/c is to: Home              Patient currently is not medically stable to d/c.   Difficult to place patient No       Consultants:   GI.    Procedures: none  Antimicrobials:  Antibiotics Given (last 72 hours)    Date/Time Action Medication Dose Rate   03/06/21 1814 New Bag/Given   cefTRIAXone (ROCEPHIN) 1 g in sodium chloride 0.9 % 100 mL IVPB 1 g 200 mL/hr   03/07/21 1715 New Bag/Given   cefTRIAXone (ROCEPHIN) 1 g in sodium chloride 0.9 % 100 mL IVPB 1 g 200 mL/hr   03/08/21 1706 New Bag/Given   cefTRIAXone (ROCEPHIN) 1 g in sodium chloride 0.9 % 100 mL IVPB 1 g 200 mL/hr         Subjective: Sleeping comfortably, briefly opened eyes.  Objective: Vitals:   03/09/21 1007 03/09/21 1012 03/09/21 1017 03/09/21 1022  BP: 136/80 (!) 174/65 (!) 144/73 (!) 143/93  Pulse: 75 66 68 68  Resp: 20 19 18 17   Temp:      TempSrc:  SpO2: 97% 93% 93% 97%  Weight:      Height:       No intake or output data in the 24 hours ending 03/09/21 1047 Filed Weights   03/06/21 1612 03/08/21 0454 03/09/21 0834  Weight: 63.6 kg 65.4 kg 65.4 kg    Examination:  General exam: Alert and comfortable.  Respiratory system: Air entry fair, no wheezing or rhonchi.  Cardiovascular system: S1S2, RRR, no JVD  Gastrointestinal system: Abdomen is soft, non tender, non distended, bowel sounds wnl.  Central nervous system: sleeping comfortably.  Extremities: no pedal edema.  Skin: no rashes seen.  Psychiatry: mood is appropriate.     Data Reviewed: I have personally reviewed following labs and imaging studies  CBC: Recent Labs  Lab  03/05/21 1458 03/06/21 1047 03/06/21 1928 03/06/21 2122 03/07/21 1013 03/07/21 1817 03/08/21 0134 03/09/21 0035  WBC 6.9 6.6  --  5.7  --   --   --  5.1  NEUTROABS 4,561 4.6  --   --   --   --   --  3.1  HGB 12.9 12.3   < > 10.5* 10.8* 9.9* 9.1* 9.1*  HCT 39.9 37.7   < > 33.1* 34.1* 31.8* 29.2* 28.6*  MCV 88.5 91.5  --  91.7  --   --   --  93.5  PLT 253 234  --  216  --   --   --  218   < > = values in this interval not displayed.    Basic Metabolic Panel: Recent Labs  Lab 03/05/21 1458 03/06/21 1047 03/07/21 1013  NA 139 136 139  K 4.2 4.2 4.1  CL 100 101 107  CO2 27 25 24   GLUCOSE 127* 207* 135*  BUN 31* 30* 21  CREATININE 1.71* 1.71* 1.53*  CALCIUM 10.1  10.1 9.8 9.3  MG 2.2  --   --     GFR: Estimated Creatinine Clearance: 23.3 mL/min (A) (by C-G formula based on SCr of 1.53 mg/dL (H)).  Liver Function Tests: Recent Labs  Lab 03/05/21 1458 03/06/21 1047 03/07/21 1013  AST 27 22 24   ALT 22 17 19   ALKPHOS  --  80 72  BILITOT 0.6 0.6 0.7  PROT 6.7 6.5 5.5*  ALBUMIN  --  3.9 3.1*    CBG: No results for input(s): GLUCAP in the last 168 hours.   Recent Results (from the past 240 hour(s))  Urine Culture     Status: Abnormal   Collection Time: 03/05/21  4:41 PM   Specimen: Urine  Result Value Ref Range Status   MICRO NUMBER: [id number]  Final   SPECIMEN QUALITY: Adequate  Final   Sample Source URINE  Final   STATUS: FINAL  Final   ISOLATE 1: Escherichia coli (A)  Final    Comment: Greater than 100,000 CFU/mL of Escherichia coli      Susceptibility   Escherichia coli - URINE CULTURE, REFLEX    AMOX/CLAVULANIC 4 Sensitive     AMPICILLIN <=2 Sensitive     AMPICILLIN/SULBACTAM <=2 Sensitive     CEFAZOLIN* <=4 Not Reportable      * For infections other than uncomplicated UTIcaused by E. coli, K. pneumoniae or P. mirabilis:Cefazolin is resistant if MIC > or = 8 mcg/mL.(Distinguishing susceptible versus intermediatefor isolates with MIC < or = 4 mcg/mL  requiresadditional testing.)For uncomplicated UTI caused by E. coli,K. pneumoniae or P. mirabilis: Cefazolin issusceptible if MIC <32 mcg/mL and predictssusceptible to the oral agents cefaclor, cefdinir,cefpodoxime,  cefprozil, cefuroxime, cephalexinand loracarbef.    CEFEPIME <=1 Sensitive     CEFTRIAXONE <=1 Sensitive     CIPROFLOXACIN <=0.25 Sensitive     LEVOFLOXACIN <=0.12 Sensitive     ERTAPENEM <=0.5 Sensitive     GENTAMICIN <=1 Sensitive     IMIPENEM <=0.25 Sensitive     NITROFURANTOIN <=16 Sensitive     PIP/TAZO <=4 Sensitive     TOBRAMYCIN <=1 Sensitive     TRIMETH/SULFA* <=20 Sensitive      * For infections other than uncomplicated UTIcaused by E. coli, K. pneumoniae or P. mirabilis:Cefazolin is resistant if MIC > or = 8 mcg/mL.(Distinguishing susceptible versus intermediatefor isolates with MIC < or = 4 mcg/mL requiresadditional testing.)For uncomplicated UTI caused by E. coli,K. pneumoniae or P. mirabilis: Cefazolin issusceptible if MIC <32 mcg/mL and predictssusceptible to the oral agents cefaclor, cefdinir,cefpodoxime, cefprozil, cefuroxime, cephalexinand loracarbef.Legend:S = Susceptible  I = IntermediateR = Resistant  NS = Not susceptible* = Not tested  NR = Not reported**NN = See antimicrobic comments  MICROSCOPIC MESSAGE     Status: None   Collection Time: 03/05/21  4:41 PM  Result Value Ref Range Status   Note   Final    Comment: This urine was analyzed for the presence of WBC,  RBC, bacteria, casts, and other formed elements.  Only those elements seen were reported. . .   Resp Panel by RT-PCR (Flu A&B, Covid) Nasopharyngeal Swab     Status: None   Collection Time: 03/06/21  1:42 PM   Specimen: Nasopharyngeal Swab; Nasopharyngeal(NP) swabs in vial transport medium  Result Value Ref Range Status   SARS Coronavirus 2 by RT PCR NEGATIVE NEGATIVE Final    Comment: (NOTE) SARS-CoV-2 target nucleic acids are NOT DETECTED.  The SARS-CoV-2 RNA is generally detectable in  upper respiratory specimens during the acute phase of infection. The lowest concentration of SARS-CoV-2 viral copies this assay can detect is 138 copies/mL. A negative result does not preclude SARS-Cov-2 infection and should not be used as the sole basis for treatment or other patient management decisions. A negative result may occur with  improper specimen collection/handling, submission of specimen other than nasopharyngeal swab, presence of viral mutation(s) within the areas targeted by this assay, and inadequate number of viral copies(<138 copies/mL). A negative result must be combined with clinical observations, patient history, and epidemiological information. The expected result is Negative.  Fact Sheet for Patients:  EntrepreneurPulse.com.au  Fact Sheet for Healthcare Providers:  IncredibleEmployment.be  This test is no t yet approved or cleared by the [place] FDA and  has been authorized for detection and/or diagnosis of SARS-CoV-2 by FDA under an Emergency Use Authorization (EUA). This EUA will remain  in effect (meaning this test can be used) for the duration of the COVID-19 declaration under Section 564(b)(1) of the Act, 21 U.S.C.section 360bbb-3(b)(1), unless the authorization is terminated  or revoked sooner.       Influenza A by PCR NEGATIVE NEGATIVE Final   Influenza B by PCR NEGATIVE NEGATIVE Final    Comment: (NOTE) The Xpert Xpress SARS-CoV-2/FLU/RSV plus assay is intended as an aid in the diagnosis of influenza from Nasopharyngeal swab specimens and should not be used as a sole basis for treatment. Nasal washings and aspirates are unacceptable for Xpert Xpress SARS-CoV-2/FLU/RSV testing.  Fact Sheet for Patients: EntrepreneurPulse.com.au  Fact Sheet for Healthcare Providers: IncredibleEmployment.be  This test is not yet approved or cleared by the [place] FDA and has been  authorized for detection and/or  diagnosis of SARS-CoV-2 by FDA under an Emergency Use Authorization (EUA). This EUA will remain in effect (meaning this test can be used) for the duration of the COVID-19 declaration under Section 564(b)(1) of the Act, 21 U.S.C. section 360bbb-3(b)(1), unless the authorization is terminated or revoked.  Performed at [facility]          Radiology Studies: No results found.      Scheduled Meds: . amiodarone  200 mg Oral Daily  . cholecalciferol  1,000 Units Oral Daily  . hydrocortisone  25 mg Rectal BID  . pantoprazole (PROTONIX) IV  40 mg Intravenous Q24H  . polyethylene glycol  17 g Oral Daily   Continuous Infusions: . sodium chloride 75 mL/hr at 03/08/21 1226  . cefTRIAXone (ROCEPHIN)  IV 1 g (03/08/21 1706)     LOS: 3 days        Hosie Poisson, MD [facility]   To contact the attending provider between 7A-7P or the covering provider during after hours 7P-7A, please log into the web site [email or web address] and access using universal [place] password for that web site. If you do not have the password, please call the hospital operator.  03/09/2021, 10:47 AM

## 2021-03-10 ENCOUNTER — Encounter (HOSPITAL_COMMUNITY): Payer: Self-pay | Admitting: Gastroenterology

## 2021-03-10 DIAGNOSIS — Z7901 Long term (current) use of anticoagulants: Secondary | ICD-10-CM

## 2021-03-10 DIAGNOSIS — D62 Acute posthemorrhagic anemia: Secondary | ICD-10-CM

## 2021-03-10 LAB — HEMOGLOBIN AND HEMATOCRIT, BLOOD
HCT: 33.3 % — ABNORMAL LOW (ref 36.0–46.0)
Hemoglobin: 10.3 g/dL — ABNORMAL LOW (ref 12.0–15.0)

## 2021-03-10 MED ORDER — APIXABAN 2.5 MG PO TABS: 2.5000 mg | ORAL_TABLET | Freq: Two times a day (BID) | ORAL | 1 refills | Status: DC

## 2021-03-10 MED ORDER — POLYETHYLENE GLYCOL 3350 17 G PO PACK
17.0000 g | PACK | Freq: Every day | ORAL | 0 refills | Status: AC
Start: 2021-03-11 — End: ?

## 2021-03-10 MED ORDER — DIAZEPAM 2 MG PO TABS: ORAL_TABLET | ORAL | 0 refills | Status: DC

## 2021-03-10 NOTE — Progress Notes (Signed)
Physical Therapy Treatment Patient Details Name: Briana [name] MRN: [id number] DOB: May 23, 1934 Today's Date: 03/10/2021    History of Present Illness Pt adm 4/21 adm with rectal bleeding likely lower GI bleed. Work up in progress. PMH - HTN, DM, CKD, CHF.    PT Comments    Today's skilled session continued to focus on mobility progression with no issues noted or reported in session. Acute PT to continue during pt's hospital stay.  Follow Up Recommendations  Home health PT;Supervision - Intermittent     Equipment Recommendations  None recommended by PT    Precautions / Restrictions Precautions Precautions: Fall Restrictions Weight Bearing Restrictions: No    Mobility  Bed Mobility Overal bed mobility: Modified Independent                  Transfers Overall transfer level: Needs assistance Equipment used: Rolling walker (2 wheeled) Transfers: Sit to/from Stand Sit to Stand: Supervision         General transfer comment: no assist or cues needed. supervision to manage lines/general safety  Ambulation/Gait Ambulation/Gait assistance: Supervision Gait Distance (Feet): 360 Feet Assistive device: Rolling walker (2 wheeled) Gait Pattern/deviations: Step-through pattern;Decreased stride length Gait velocity: decr   General Gait Details: no assist or cues needed. Pt able to scan around hallway with no balance issues noted.       Cognition Arousal/Alertness: Awake/alert Behavior During Therapy: WFL for tasks assessed/performed Overall Cognitive Status: Within Functional Limits for tasks assessed                   Pertinent Vitals/Pain Pain Assessment: No/denies pain     PT Goals (current goals can now be found in the care plan section) Acute Rehab PT Goals Patient Stated Goal: Return home PT Goal Formulation: With patient Time For Goal Achievement: 03/22/21 Potential to Achieve Goals: Good Progress towards PT goals: Progressing toward goals     Frequency    Min 3X/week      PT Plan Current plan remains appropriate    AM-PAC PT "6 Clicks" Mobility   Outcome Measure  Help needed turning from your back to your side while in a flat bed without using bedrails?: None Help needed moving from lying on your back to sitting on the side of a flat bed without using bedrails?: None Help needed moving to and from a bed to a chair (including a wheelchair)?: A Little Help needed standing up from a chair using your arms (e.g., wheelchair or bedside chair)?: A Little Help needed to walk in hospital room?: A Little Help needed climbing 3-5 steps with a railing? : A Little 6 Click Score: 20    End of Session Equipment Utilized During Treatment: Gait belt Activity Tolerance: Patient tolerated treatment well Patient left: in bed;with call bell/phone within reach;with nursing/sitter in room Nurse Communication: Mobility status PT Visit Diagnosis: Other abnormalities of gait and mobility (R26.89);Muscle weakness (generalized) (M62.81)     Time: 5697-9480 PT Time Calculation (min) (ACUTE ONLY): 13 min  Charges:  $Gait Training: 8-22 mins                     Willow Ora, PTA, [facility] [employer] [phone] 03/10/21, 11:54 AM  Willow Ora 03/10/2021, 11:53 AM

## 2021-03-10 NOTE — TOC Transition Note (Signed)
Transition of Care [facility]) - CM/SW Discharge Note   Patient Details  Name: Briana [name] MRN: [id number] Date of Birth: Jan 24, 1934  [facility] CM/SW Contact:  Sharin Mons, RN Phone Number: 03/10/2021, 4:14 PM   Clinical Narrative:    Patient will DC to: home Anticipated DC date: 03/10/2021 Family notified: yes Transport by: car  Per MD patient ready for DC today . RN, patient, and patient's family notified of DC. Pt active with [facility] . Resumption orders placed for home health PT/OT services. Pt without Rx med concerns. Post hospital f/u noted on AVS.  RNCM will sign off for now as intervention is no longer needed. Please consult [place] again if new needs arise.    Final next level of care: Home w Home Health Services Barriers to Discharge: No Barriers Identified   Patient Goals and CMS Choice        Discharge Placement                       Discharge Plan and Services   Discharge Planning Services: CM Consult                      HH Arranged: PT,OT [place] Agency: [place] Date [facility] Agency Contacted: 03/10/21 Time [place]: [id number] Representative spoke with at [place]: [place] Determinants of Health ([place]) Interventions     Readmission Risk Interventions No flowsheet data found.

## 2021-03-10 NOTE — Discharge Summary (Signed)
Physician Discharge Summary  Briana [name] QAS:[id number] DOB: Aug 07, 1934 DOA: 03/06/2021  PCP: Unk Pinto, MD  Admit date: 03/06/2021 Discharge date: 03/10/2021  Admitted From: Home.  Disposition: Home.   Recommendations for Outpatient Follow-up:  1. Follow up with PCP in 1-2 weeks 2. Please obtain BMP/CBC in one week 3. Please follow up with gastroenterology as needed and as recommended.  4. Restart eliquis on Sunday / hold for 5 days.   Home Health: Yes.   Discharge Condition: stable  CODE STATUS: full code.  Diet recommendation: Heart Healthy   Brief/Interim Summary: Briana [name] a 87 y.o.femalewith medical history significant ofhypertension, hyperlipidemia, chronic diastolic heart failure, diverticulosis atrial fibrillation on eliquis, presented to ED with rectal bleeding started Monday, associated with some abdominal cramping only. On admission her hemoglobin was around 12 dropped to 10.5 to 9.  GI consulted for recommendations. She udnerwent flex sigmoidoscopy and was recommended hold eliquis for 5 days.  Repeat hemoglobin has stabilized.   Discharge Diagnoses:  Active Problems:   Essential hypertension   Type 2 diabetes mellitus with hyperlipidemia (HCC)   GERD   CKD stage 3 due to type 2 diabetes mellitus (HCC)   Chronic diastolic heart failure (HCC)   Lower GI bleed   Rectal bleeding   Diverticular hemorrhage   Rectal bleeding probably lower GI bleed in view of her diverticulosis and internal hemorrhoids.  Bleeding has stopped.  Hemoglobin on admission is 12, dropped to 10.5 to 9.1.    Continue with PPI as recommended.  Last colonoscopy was in 2011 by Dr Fuller Plan.  GI suggested sigmoidoscopy which showed Severe diverticulosis in the sigmoid colon and descending colon. There was evidence of an impacted diverticulum. There was evidence of recent bleeding from the diverticular opening. There was evidence of past bleeding from the diverticular opening. The  bleeding has stopped and Repeat hemoglobin on discharge is stable,     Chronic atrial fibrillation:  Rate controlled and anti coag on hold for rectal bleeding.  recommend to restart it on Sunday.     Chronic diastolic heart failure:  She appears compensated .    Hypertension:  Well controlled.    Stage 3 b CKD: Creatinine appears to be better than baseline.     GERD: Continue with PPI.   E coli UTI:  Completed the course of antibiotics.   Discharge Instructions  Discharge Instructions    Diet - low sodium heart healthy   Complete by: As directed    Discharge instructions   Complete by: As directed    Please follow up with gastroenterology as needed.     Allergies as of 03/10/2021      Reactions   Accupril [quinapril Hcl] Cough   Neosporin [neomycin-bacitracin Zn-polymyx]    unknown   Prednisone    Agitated and shaky   Reglan [metoclopramide] Other (See Comments)   tremors   Tramadol Nausea And Vomiting   Adhesive [tape] Rash      Medication List    TAKE these medications   acetaminophen 500 MG tablet Commonly known as: TYLENOL Take 500 mg by mouth every 6 (six) hours as needed for moderate pain.   amiodarone 200 MG tablet Commonly known as: PACERONE Take 1 tablet by mouth once daily   amLODipine 2.5 MG tablet Commonly known as: NORVASC Take 1 tablet (2.5 mg total) by mouth daily.   apixaban 2.5 MG Tabs tablet Commonly known as: Eliquis Take 1 tablet (2.5 mg total) by mouth 2 (two) times daily.  Start taking on: Mar 16, 2021 What changed: These instructions start on Mar 16, 2021. If you are unsure what to do until then, ask your doctor or other care provider.   B-12 SL Place 1 tablet under the tongue daily.   cholecalciferol 25 MCG (1000 UNIT) tablet Commonly known as: VITAMIN D3 Take 1,000 Units by mouth 2 (two) times daily.   diazepam 2 MG tablet Commonly known as: Valium Take  1 tablet 3 to 4  x /day  with Meals  for  Tremor   furosemide 80 MG tablet Commonly known as: LASIX Take 1 tablet (80 mg total) by mouth 2 (two) times daily.   OSTEO BI-FLEX JOINT SHIELD PO Take 1 tablet by mouth 2 (two) times daily.   PATADAY OP Place 1 drop into the left eye daily.   polyethylene glycol 17 g packet Commonly known as: MIRALAX / GLYCOLAX Take 17 g by mouth daily. Start taking on: March 11, 2021   potassium chloride SA 20 MEQ tablet Commonly known as: KLOR-CON Take 1 tablet (20 mEq total) by mouth 2 (two) times daily.   PRESERVISION AREDS 2 PO Take 1 capsule by mouth 2 (two) times daily.   ProAir HFA 108 (90 Base) MCG/ACT inhaler Generic drug: albuterol Inhale 2 puffs into the lungs every 6 (six) hours as needed for wheezing or shortness of breath.   REFRESH DIGITAL OP Place 1 drop into the right eye daily as needed (dry eyes).   VITAMIN B-6 PO Take 1 capsule by mouth daily.   zinc gluconate 50 MG tablet Take 50 mg by mouth daily.       Follow-up Information    [facility] Follow up.   Why: resumption of home health services ( PT,OT) are in place, start of care within 48 hours post discharge Contact information: [place] [address] [phone]        Unk Pinto, MD. Schedule an appointment as soon as possible for a visit in 1 week(s).   Specialty: Internal Medicine Contact information: [address] [place] [address] [phone]        Troy Sine, MD .   Specialty: Cardiology Contact information: [address] [place] [address] [phone]              Allergies  Allergen Reactions  . Accupril [Quinapril Hcl] Cough  . Neosporin [Neomycin-Bacitracin Zn-Polymyx]     unknown  . Prednisone     Agitated and shaky  . Reglan [Metoclopramide] Other (See Comments)    tremors  . Tramadol Nausea And Vomiting  . Adhesive [Tape] Rash     Consultations: gastroenterology  Procedures/Studies: DG Bone Density  Result Date: 03/03/2021 EXAM: DUAL X-RAY ABSORPTIOMETRY (DXA) FOR BONE MINERAL DENSITY IMPRESSION: Referring Physician:  Garnet Sierras Your patient completed a bone mineral density test using GE Lunar iDXA system (analysis version: 16). Technologist: EDH/KAT PATIENT: Name: Briana [name], Briana [name] Patient ID: [id number] Birth Date: September 19, 1934 Height: 60.2 in. Sex: Female Measured: 03/03/2021 Weight: 146.0 lbs. Indications: Advanced Age, Caucasian, Eliquis, Estrogen Deficient, Height Loss (781.91), History of Fracture (Adult) (V15.51), History of Osteopenia, Left hip replaced, Postmenopausal Fractures: Left Hip, Left wrist, Right wrist Treatments: Vitamin D (E933.5) ASSESSMENT: The BMD measured at Femur Total is 0.733 g/cm2 with a T-score of -2.2. This patient is considered osteopenic/low bone mass according to [facility]) criteria. The quality of the exam is good. Left hip excluded due to surgical hardware. Lumbar was  excluded due to degenerative changes. Bilateral forearms not scanned due to previous fractures. Site Region Measured Date Measured Age YA BMD Significant CHANGE T-score Right Femur Total 5/03/2021 85.2 -2.2 0.733 g/cm2 * Right Femur Total  03/10/2018    82.2         -1.4    0.826 g/cm2 Right Femur Neck 03/03/2021 85.2 -1.7 0.796 g/cm2 * Right Femur Neck   03/10/2018    82.2         -1.4    0.842 g/cm2 World Health Organization [facility]) criteria for post-menopausal, Caucasian Women: Normal       T-score at or above -1 SD Osteopenia   T-score between -1 and -2.5 SD Osteoporosis T-score at or below -2.5 SD RECOMMENDATION: 1. All patients should optimize calcium and vitamin D intake. 2. Consider FDA-approved medical therapies in postmenopausal women and men aged 33 years and older, based on the following: a. A hip or vertebral (clinical or morphometric) fracture. b. T-score = -2.5 at the femoral neck or spine after  appropriate evaluation to exclude secondary causes. c. Low bone mass (T-score between -1.0 and -2.5 at the femoral neck or spine) and a 10-year probability of a hip fracture = 3% or a 10-year probability of a major osteoporosis-related fracture = 20% based on the [place]-adapted WHO algorithm. d. Clinician judgment and/or patient preferences may indicate treatment for people with 10-year fracture probabilities above or below these levels. FOLLOW-UP: Patients with diagnosis of osteoporosis or at high risk for fracture should have regular bone mineral density tests.? Patients eligible for Medicare are allowed routine testing every 2 years.? The testing frequency can be increased to one year for patients who have rapidly progressing disease, are receiving or discontinuing medical therapy to restore bone mass, or have additional risk factors. I have reviewed this study and agree with the findings. Mark A. Thornton Papas, M.D. [facility] FRAX* 10-year Probability of Fracture Based on femoral neck BMD: Femur (Right) Major Osteoporotic Fracture: 20.9% Hip Fracture:                5.4% Population:                  [place] (Caucasian) Risk Factors:                History of Fracture (Adult) (V15.51) *FRAX is a Materials engineer of the [employer] for Metabolic Bone Disease, a [place] (WHO) [facility]. ASSESSMENT: The probability of a major osteoporotic fracture is 20.9% within the next ten years. The probability of a hip fracture is 5.4% within the next ten years. I have reviewed this report and agree with the above findings. Mark A. Thornton Papas, M.D. [facility] Electronically Signed   By: Lavonia Dana M.D.   On: 03/03/2021 16:59   MM 3D SCREEN BREAST BILATERAL  Result Date: 03/04/2021 CLINICAL DATA:  Screening. EXAM: DIGITAL SCREENING BILATERAL MAMMOGRAM WITH TOMOSYNTHESIS AND CAD TECHNIQUE: Bilateral screening digital craniocaudal and mediolateral oblique  mammograms were obtained. Bilateral screening digital breast tomosynthesis was performed. The images were evaluated with computer-aided detection. COMPARISON:  Previous exam(s). ACR Breast Density Category b: There are scattered areas of fibroglandular density. FINDINGS: There are no findings suspicious for malignancy. The images were evaluated with computer-aided detection. IMPRESSION: No mammographic evidence of malignancy. A result letter of this screening mammogram will be mailed directly to the patient. RECOMMENDATION: Screening mammogram in one year. (Code:[id number]-B-01Y) BI-RADS CATEGORY  1: Negative. Electronically Signed   By:  Claudie Revering M.D.   On: 03/04/2021 08:36    (Echo, Carotid, EGD, Colonoscopy, ERCP)    Subjective:   Discharge Exam: Vitals:   03/09/21 1519 03/09/21 2130  BP: (!) 144/72 129/78  Pulse: 70 66  Resp: 20 20  Temp: 98.3 F (36.8 C) 98.3 F (36.8 C)  SpO2: 94% 97%   Vitals:   03/09/21 1229 03/09/21 1230 03/09/21 1519 03/09/21 2130  BP:   (!) 144/72 129/78  Pulse:   70 66  Resp:   20 20  Temp:   98.3 F (36.8 C) 98.3 F (36.8 C)  TempSrc:   Oral Oral  SpO2: (!) 86% 92% 94% 97%  Weight:      Height:        General: Pt is alert, awake, not in acute distress Cardiovascular: RRR, S1/S2 +, no rubs, no gallops Respiratory: CTA bilaterally, no wheezing, no rhonchi Abdominal: Soft, NT, ND, bowel sounds + Extremities: no edema, no cyanosis    The results of significant diagnostics from this hospitalization (including imaging, microbiology, ancillary and laboratory) are listed below for reference.     Microbiology: Recent Results (from the past 240 hour(s))  Urine Culture     Status: Abnormal   Collection Time: 03/05/21  4:41 PM   Specimen: Urine  Result Value Ref Range Status   MICRO NUMBER: [id number]  Final   SPECIMEN QUALITY: Adequate  Final   Sample Source URINE  Final   STATUS: FINAL  Final   ISOLATE 1: Escherichia coli (A)  Final    Comment:  Greater than 100,000 CFU/mL of Escherichia coli      Susceptibility   Escherichia coli - URINE CULTURE, REFLEX    AMOX/CLAVULANIC 4 Sensitive     AMPICILLIN <=2 Sensitive     AMPICILLIN/SULBACTAM <=2 Sensitive     CEFAZOLIN* <=4 Not Reportable      * For infections other than uncomplicated UTIcaused by E. coli, K. pneumoniae or P. mirabilis:Cefazolin is resistant if MIC > or = 8 mcg/mL.(Distinguishing susceptible versus intermediatefor isolates with MIC < or = 4 mcg/mL requiresadditional testing.)For uncomplicated UTI caused by E. coli,K. pneumoniae or P. mirabilis: Cefazolin issusceptible if MIC <32 mcg/mL and predictssusceptible to the oral agents cefaclor, cefdinir,cefpodoxime, cefprozil, cefuroxime, cephalexinand loracarbef.    CEFEPIME <=1 Sensitive     CEFTRIAXONE <=1 Sensitive     CIPROFLOXACIN <=0.25 Sensitive     LEVOFLOXACIN <=0.12 Sensitive     ERTAPENEM <=0.5 Sensitive     GENTAMICIN <=1 Sensitive     IMIPENEM <=0.25 Sensitive     NITROFURANTOIN <=16 Sensitive     PIP/TAZO <=4 Sensitive     TOBRAMYCIN <=1 Sensitive     TRIMETH/SULFA* <=20 Sensitive      * For infections other than uncomplicated UTIcaused by E. coli, K. pneumoniae or P. mirabilis:Cefazolin is resistant if MIC > or = 8 mcg/mL.(Distinguishing susceptible versus intermediatefor isolates with MIC < or = 4 mcg/mL requiresadditional testing.)For uncomplicated UTI caused by E. coli,K. pneumoniae or P. mirabilis: Cefazolin issusceptible if MIC <32 mcg/mL and predictssusceptible to the oral agents cefaclor, cefdinir,cefpodoxime, cefprozil, cefuroxime, cephalexinand loracarbef.Legend:S = Susceptible  I = IntermediateR = Resistant  NS = Not susceptible* = Not tested  NR = Not reported**NN = See antimicrobic comments  MICROSCOPIC MESSAGE     Status: None   Collection Time: 03/05/21  4:41 PM  Result Value Ref Range Status   Note   Final    Comment: This urine was analyzed for the presence of  WBC,  RBC, bacteria, casts, and  other formed elements.  Only those elements seen were reported. . .   Resp Panel by RT-PCR (Flu A&B, Covid) Nasopharyngeal Swab     Status: None   Collection Time: 03/06/21  1:42 PM   Specimen: Nasopharyngeal Swab; Nasopharyngeal(NP) swabs in vial transport medium  Result Value Ref Range Status   SARS Coronavirus 2 by RT PCR NEGATIVE NEGATIVE Final    Comment: (NOTE) SARS-CoV-2 target nucleic acids are NOT DETECTED.  The SARS-CoV-2 RNA is generally detectable in upper respiratory specimens during the acute phase of infection. The lowest concentration of SARS-CoV-2 viral copies this assay can detect is 138 copies/mL. A negative result does not preclude SARS-Cov-2 infection and should not be used as the sole basis for treatment or other patient management decisions. A negative result may occur with  improper specimen collection/handling, submission of specimen other than nasopharyngeal swab, presence of viral mutation(s) within the areas targeted by this assay, and inadequate number of viral copies(<138 copies/mL). A negative result must be combined with clinical observations, patient history, and epidemiological information. The expected result is Negative.  Fact Sheet for Patients:  EntrepreneurPulse.com.au  Fact Sheet for Healthcare Providers:  IncredibleEmployment.be  This test is no t yet approved or cleared by the [place] FDA and  has been authorized for detection and/or diagnosis of SARS-CoV-2 by FDA under an Emergency Use Authorization (EUA). This EUA will remain  in effect (meaning this test can be used) for the duration of the COVID-19 declaration under Section 564(b)(1) of the Act, 21 U.S.C.section 360bbb-3(b)(1), unless the authorization is terminated  or revoked sooner.       Influenza A by PCR NEGATIVE NEGATIVE Final   Influenza B by PCR NEGATIVE NEGATIVE Final    Comment: (NOTE) The Xpert Xpress SARS-CoV-2/FLU/RSV plus  assay is intended as an aid in the diagnosis of influenza from Nasopharyngeal swab specimens and should not be used as a sole basis for treatment. Nasal washings and aspirates are unacceptable for Xpert Xpress SARS-CoV-2/FLU/RSV testing.  Fact Sheet for Patients: EntrepreneurPulse.com.au  Fact Sheet for Healthcare Providers: IncredibleEmployment.be  This test is not yet approved or cleared by the [place] FDA and has been authorized for detection and/or diagnosis of SARS-CoV-2 by FDA under an Emergency Use Authorization (EUA). This EUA will remain in effect (meaning this test can be used) for the duration of the COVID-19 declaration under Section 564(b)(1) of the Act, 21 U.S.C. section 360bbb-3(b)(1), unless the authorization is terminated or revoked.  Performed at [facility]      Labs: BNP (last 3 results) Recent Labs    01/22/21 1745  BNP 631.4*   Basic Metabolic Panel: Recent Labs  Lab 03/05/21 1458 03/06/21 1047 03/07/21 1013  NA 139 136 139  K 4.2 4.2 4.1  CL 100 101 107  CO2 27 25 24   GLUCOSE 127* 207* 135*  BUN 31* 30* 21  CREATININE 1.71* 1.71* 1.53*  CALCIUM 10.1  10.1 9.8 9.3  MG 2.2  --   --    Liver Function Tests: Recent Labs  Lab 03/05/21 1458 03/06/21 1047 03/07/21 1013  AST 27 22 24   ALT 22 17 19   ALKPHOS  --  80 72  BILITOT 0.6 0.6 0.7  PROT 6.7 6.5 5.5*  ALBUMIN  --  3.9 3.1*   No results for input(s): LIPASE, AMYLASE in the last 168 hours. No results for input(s): AMMONIA in the last 168 hours. CBC: Recent Labs  Lab 03/05/21 1458 03/06/21 1047 03/06/21 1928 03/06/21 2122 03/07/21 1013 03/07/21 1817 03/08/21 0134 03/09/21 0035  WBC 6.9 6.6  --  5.7  --   --   --  5.1  NEUTROABS 4,561 4.6  --   --   --   --   --  3.1  HGB 12.9 12.3   < > 10.5* 10.8* 9.9* 9.1* 9.1*  HCT 39.9 37.7   < > 33.1* 34.1* 31.8* 29.2* 28.6*  MCV 88.5 91.5  --  91.7  --   --   --  93.5  PLT  253 234  --  216  --   --   --  218   < > = values in this interval not displayed.   Cardiac Enzymes: No results for input(s): CKTOTAL, CKMB, CKMBINDEX, TROPONINI in the last 168 hours. BNP: Invalid input(s): POCBNP CBG: No results for input(s): GLUCAP in the last 168 hours. D-Dimer No results for input(s): DDIMER in the last 72 hours. Hgb A1c No results for input(s): HGBA1C in the last 72 hours. Lipid Profile No results for input(s): CHOL, HDL, LDLCALC, TRIG, CHOLHDL, LDLDIRECT in the last 72 hours. Thyroid function studies No results for input(s): TSH, T4TOTAL, T3FREE, THYROIDAB in the last 72 hours.  Invalid input(s): FREET3 Anemia work up No results for input(s): VITAMINB12, FOLATE, FERRITIN, TIBC, IRON, RETICCTPCT in the last 72 hours. Urinalysis    Component Value Date/Time   COLORURINE YELLOW 03/05/2021 1641   APPEARANCEUR CLOUDY (A) 03/05/2021 1641   LABSPEC 1.010 03/05/2021 1641   PHURINE < OR = 5.0 03/05/2021 1641   GLUCOSEU NEGATIVE 03/05/2021 1641   HGBUR TRACE (A) 03/05/2021 1641   BILIRUBINUR NEGATIVE 01/22/2021 2230   KETONESUR NEGATIVE 03/05/2021 1641   PROTEINUR NEGATIVE 03/05/2021 1641   UROBILINOGEN 0.2 12/12/2012 1240   NITRITE POSITIVE (A) 03/05/2021 1641   LEUKOCYTESUR 3+ (A) 03/05/2021 1641   Sepsis Labs Invalid input(s): PROCALCITONIN,  WBC,  LACTICIDVEN Microbiology Recent Results (from the past 240 hour(s))  Urine Culture     Status: Abnormal   Collection Time: 03/05/21  4:41 PM   Specimen: Urine  Result Value Ref Range Status   MICRO NUMBER: [id number]  Final   SPECIMEN QUALITY: Adequate  Final   Sample Source URINE  Final   STATUS: FINAL  Final   ISOLATE 1: Escherichia coli (A)  Final    Comment: Greater than 100,000 CFU/mL of Escherichia coli      Susceptibility   Escherichia coli - URINE CULTURE, REFLEX    AMOX/CLAVULANIC 4 Sensitive     AMPICILLIN <=2 Sensitive     AMPICILLIN/SULBACTAM <=2 Sensitive     CEFAZOLIN* <=4 Not Reportable       * For infections other than uncomplicated UTIcaused by E. coli, K. pneumoniae or P. mirabilis:Cefazolin is resistant if MIC > or = 8 mcg/mL.(Distinguishing susceptible versus intermediatefor isolates with MIC < or = 4 mcg/mL requiresadditional testing.)For uncomplicated UTI caused by E. coli,K. pneumoniae or P. mirabilis: Cefazolin issusceptible if MIC <32 mcg/mL and predictssusceptible to the oral agents cefaclor, cefdinir,cefpodoxime, cefprozil, cefuroxime, cephalexinand loracarbef.    CEFEPIME <=1 Sensitive     CEFTRIAXONE <=1 Sensitive     CIPROFLOXACIN <=0.25 Sensitive     LEVOFLOXACIN <=0.12 Sensitive     ERTAPENEM <=0.5 Sensitive     GENTAMICIN <=1 Sensitive     IMIPENEM <=0.25 Sensitive     NITROFURANTOIN <=16 Sensitive     PIP/TAZO <=4 Sensitive     TOBRAMYCIN <=1 Sensitive  TRIMETH/SULFA* <=20 Sensitive      * For infections other than uncomplicated UTIcaused by E. coli, K. pneumoniae or P. mirabilis:Cefazolin is resistant if MIC > or = 8 mcg/mL.(Distinguishing susceptible versus intermediatefor isolates with MIC < or = 4 mcg/mL requiresadditional testing.)For uncomplicated UTI caused by E. coli,K. pneumoniae or P. mirabilis: Cefazolin issusceptible if MIC <32 mcg/mL and predictssusceptible to the oral agents cefaclor, cefdinir,cefpodoxime, cefprozil, cefuroxime, cephalexinand loracarbef.Legend:S = Susceptible  I = IntermediateR = Resistant  NS = Not susceptible* = Not tested  NR = Not reported**NN = See antimicrobic comments  MICROSCOPIC MESSAGE     Status: None   Collection Time: 03/05/21  4:41 PM  Result Value Ref Range Status   Note   Final    Comment: This urine was analyzed for the presence of WBC,  RBC, bacteria, casts, and other formed elements.  Only those elements seen were reported. . .   Resp Panel by RT-PCR (Flu A&B, Covid) Nasopharyngeal Swab     Status: None   Collection Time: 03/06/21  1:42 PM   Specimen: Nasopharyngeal Swab; Nasopharyngeal(NP) swabs in  vial transport medium  Result Value Ref Range Status   SARS Coronavirus 2 by RT PCR NEGATIVE NEGATIVE Final    Comment: (NOTE) SARS-CoV-2 target nucleic acids are NOT DETECTED.  The SARS-CoV-2 RNA is generally detectable in upper respiratory specimens during the acute phase of infection. The lowest concentration of SARS-CoV-2 viral copies this assay can detect is 138 copies/mL. A negative result does not preclude SARS-Cov-2 infection and should not be used as the sole basis for treatment or other patient management decisions. A negative result may occur with  improper specimen collection/handling, submission of specimen other than nasopharyngeal swab, presence of viral mutation(s) within the areas targeted by this assay, and inadequate number of viral copies(<138 copies/mL). A negative result must be combined with clinical observations, patient history, and epidemiological information. The expected result is Negative.  Fact Sheet for Patients:  EntrepreneurPulse.com.au  Fact Sheet for Healthcare Providers:  IncredibleEmployment.be  This test is no t yet approved or cleared by the [place] FDA and  has been authorized for detection and/or diagnosis of SARS-CoV-2 by FDA under an Emergency Use Authorization (EUA). This EUA will remain  in effect (meaning this test can be used) for the duration of the COVID-19 declaration under Section 564(b)(1) of the Act, 21 U.S.C.section 360bbb-3(b)(1), unless the authorization is terminated  or revoked sooner.       Influenza A by PCR NEGATIVE NEGATIVE Final   Influenza B by PCR NEGATIVE NEGATIVE Final    Comment: (NOTE) The Xpert Xpress SARS-CoV-2/FLU/RSV plus assay is intended as an aid in the diagnosis of influenza from Nasopharyngeal swab specimens and should not be used as a sole basis for treatment. Nasal washings and aspirates are unacceptable for Xpert Xpress SARS-CoV-2/FLU/RSV testing.  Fact  Sheet for Patients: EntrepreneurPulse.com.au  Fact Sheet for Healthcare Providers: IncredibleEmployment.be  This test is not yet approved or cleared by the [place] FDA and has been authorized for detection and/or diagnosis of SARS-CoV-2 by FDA under an Emergency Use Authorization (EUA). This EUA will remain in effect (meaning this test can be used) for the duration of the COVID-19 declaration under Section 564(b)(1) of the Act, 21 U.S.C. section 360bbb-3(b)(1), unless the authorization is terminated or revoked.  Performed at [facility]      Time coordinating discharge: 38 minutes.   SIGNED:   Hosie Poisson, MD  [facility] 03/10/2021, 11:35  AM

## 2021-03-10 NOTE — Progress Notes (Addendum)
          Daily Rounding Note  03/10/2021, 10:15 AM  LOS: 4 days   SUBJECTIVE:   Chief complaint:    Diverticular bleed. No further stools or bleeding yesterday or today.  Feels well.  Hoping to get out of the hospital this afternoon.  OBJECTIVE:         Vital signs in last 24 hours:    Temp:  [98.3 F (36.8 C)] 98.3 F (36.8 C) (04/24 2130) Pulse Rate:  [66-70] 66 (04/24 2130) Resp:  [17-20] 20 (04/24 2130) BP: (129-144)/(72-93) 129/78 (04/24 2130) SpO2:  [86 %-97 %] 97 % (04/24 2130) Last BM Date: 03/08/21 Filed Weights   03/06/21 1612 03/08/21 0454 03/09/21 0834  Weight: 63.6 kg 65.4 kg 65.4 kg   General: Elderly, well-appearing, comfortable, alert. Heart: RRR. Chest: No labored breathing or cough Abdomen: Not tender or distended.  Active bowel sounds. Extremities: No CCE. Neuro/Psych: Oriented x3.  Moves all 4 limbs.  No tremors or gross deficits.  Intake/Output from previous day: No intake/output data recorded.  Intake/Output this shift: No intake/output data recorded.  Lab Results: Recent Labs    03/07/21 1817 03/08/21 0134 03/09/21 0035  WBC  --   --  5.1  HGB 9.9* 9.1* 9.1*  HCT 31.8* 29.2* 28.6*  PLT  --   --  218   BMET No results for input(s): NA, K, CL, CO2, GLUCOSE, BUN, CREATININE, CALCIUM in the last 72 hours. LFT No results for input(s): PROT, ALBUMIN, AST, ALT, ALKPHOS, BILITOT, BILIDIR, IBILI in the last 72 hours. PT/INR No results for input(s): LABPROT, INR in the last 72 hours. Hepatitis Panel No results for input(s): HEPBSAG, HCVAB, HEPAIGM, HEPBIGM in the last 72 hours.  Studies/Results: No results found.   Scheduled Meds: . amiodarone  200 mg Oral Daily  . amoxicillin-clavulanate  1 tablet Oral BID  . cholecalciferol  1,000 Units Oral Daily  . polyethylene glycol  17 g Oral Daily   Continuous Infusions: PRN Meds:.acetaminophen, albuterol, diazepam   ASSESMENT:   *    Painless hematochezia. 03/09/2021 flexible sigmoidoscopy: Severe diverticulosis in sigmoid, descending colon.  Evidence of recent bleeding at a diverticulum with adherent clot but no active bleeding.  2 hemostatic clips were placed at this site.  Nonbleeding internal hemorrhoids.  *    Chronic Eliquis for A fib.   Last dose  *     Blood loss anemia.  Hgb 12.9 >> 9.1 >> 9.1.  No transfusions to date.  *    E. coli UTI.  Augmentin in place   PLAN   *   Agree that if Hgb and hematocrit are stable on the assay that is now ordered, she can go home. No need for GI follow-up w Dr Fuller Plan unless she is having GI issues.  *   Discontinued the IV Protonix.  Was not on PPI or H2 blocker PTA. Discontinued the hydrocortisone rectal suppository.    *   Restart Eliquis on Sunday, 03/16/2021  Azucena Freed  03/10/2021, 10:15 AM Phone [phone]  GI ATTENDING  Interval history data reviewed.  Case reviewed with Dr. Silverio Decamp.  Bleeding has ceased.  Okay for discharge home.  Resume anticoagulation this coming Sunday.  Outpatient GI follow-up as needed.  We will sign off.  Docia Chuck. Geri Seminole., M.D. [facility] Division of Gastroenterology

## 2021-03-10 NOTE — Care Management Important Message (Signed)
Important Message  Patient Details  Name: Briana [name] MRN: [id number] Date of Birth: 04/30/1934   Medicare Important Message Given:  Yes     [name] [name] 03/10/2021, 12:53 PM

## 2021-03-10 NOTE — Progress Notes (Signed)
Briana [name] to be D/C'd Home per MD order.  Discussed with the patient and all questions fully answered.  VSS, Skin clean, dry and intact without evidence of skin break down, no evidence of skin tears noted. IV catheter discontinued intact. Site without signs and symptoms of complications. Dressing and pressure applied.  An After Visit Summary was printed and given to the patient.  D/c education completed with patient/family including follow up instructions, medication list, d/c activities limitations if indicated, with other d/c instructions as indicated by MD - patient able to verbalize understanding, all questions fully answered.   Patient instructed to return to ED, call 911, or call MD for any changes in condition.   Patient escorted via [place], and D/C home via private auto.  Briana [name] 03/10/2021 5:12 PM

## 2021-03-11 ENCOUNTER — Encounter: Payer: Self-pay | Admitting: *Deleted

## 2021-03-11 ENCOUNTER — Telehealth: Payer: Self-pay | Admitting: *Deleted

## 2021-03-11 DIAGNOSIS — D631 Anemia in chronic kidney disease: Secondary | ICD-10-CM | POA: Diagnosis not present

## 2021-03-11 DIAGNOSIS — N390 Urinary tract infection, site not specified: Secondary | ICD-10-CM | POA: Diagnosis not present

## 2021-03-11 DIAGNOSIS — I2510 Atherosclerotic heart disease of native coronary artery without angina pectoris: Secondary | ICD-10-CM | POA: Diagnosis not present

## 2021-03-11 DIAGNOSIS — M810 Age-related osteoporosis without current pathological fracture: Secondary | ICD-10-CM | POA: Diagnosis not present

## 2021-03-11 DIAGNOSIS — M150 Primary generalized (osteo)arthritis: Secondary | ICD-10-CM | POA: Diagnosis not present

## 2021-03-11 DIAGNOSIS — N1831 Chronic kidney disease, stage 3a: Secondary | ICD-10-CM | POA: Diagnosis not present

## 2021-03-11 DIAGNOSIS — E1165 Type 2 diabetes mellitus with hyperglycemia: Secondary | ICD-10-CM | POA: Diagnosis not present

## 2021-03-11 DIAGNOSIS — Z9181 History of falling: Secondary | ICD-10-CM | POA: Diagnosis not present

## 2021-03-11 DIAGNOSIS — I5032 Chronic diastolic (congestive) heart failure: Secondary | ICD-10-CM | POA: Diagnosis not present

## 2021-03-11 DIAGNOSIS — Z7901 Long term (current) use of anticoagulants: Secondary | ICD-10-CM | POA: Diagnosis not present

## 2021-03-11 DIAGNOSIS — I130 Hypertensive heart and chronic kidney disease with heart failure and stage 1 through stage 4 chronic kidney disease, or unspecified chronic kidney disease: Secondary | ICD-10-CM | POA: Diagnosis not present

## 2021-03-11 DIAGNOSIS — K5730 Diverticulosis of large intestine without perforation or abscess without bleeding: Secondary | ICD-10-CM | POA: Diagnosis not present

## 2021-03-11 DIAGNOSIS — D62 Acute posthemorrhagic anemia: Secondary | ICD-10-CM | POA: Diagnosis not present

## 2021-03-11 DIAGNOSIS — E1122 Type 2 diabetes mellitus with diabetic chronic kidney disease: Secondary | ICD-10-CM | POA: Diagnosis not present

## 2021-03-11 DIAGNOSIS — M519 Unspecified thoracic, thoracolumbar and lumbosacral intervertebral disc disorder: Secondary | ICD-10-CM | POA: Diagnosis not present

## 2021-03-11 DIAGNOSIS — I4891 Unspecified atrial fibrillation: Secondary | ICD-10-CM | POA: Diagnosis not present

## 2021-03-11 DIAGNOSIS — E785 Hyperlipidemia, unspecified: Secondary | ICD-10-CM | POA: Diagnosis not present

## 2021-03-11 NOTE — Progress Notes (Signed)
Future Appointments  Date Time Provider [place]  03/12/2021  9:00 AM Unk Pinto, MD [facility] None  03/25/2021  2:45 PM Deberah Pelton, NP [facility]  03/28/2021 11:30 AM Levin Erp, PA [facility]  03/09/2022  3:00 PM Unk Pinto, MD [facility] None    [place]     This very nice 87 y.o.  [facility]  was admitted to the hospital on  03/06/2021 and patient was discharged from the hospital on 03/10/2021. The patient now presents for follow up for transition from recent hospitalization.  The day after discharge  our clinical staff contacted the patient to assure stability and schedule a follow up appointment. The discharge summary, medications and diagnostic test results were reviewed before meeting with the patient. The patient was admitted for:   Diverticular hemorrhage Lower GI bleed Rectal bleeding Essential hypertension Chronic diastolic heart failure  Hyperlipidemia associated with type 2 diabetes mellitus  Type 2 diabetes mellitus with stage 3a chronic kidney disease Gastroesophageal reflux disease       Patient is a very nice 87 yo WWF with HTN, ASHD, T2_DM  on Eliquis for cAfib who was admitted thru the ER for new onset of  Rectal hemorrhage.  Initial CBC had Hgb 12.3 gm% which dropped to 10.5 gm% on the 1st hospital day. Eliquis was held & serial CBC's were monitored and hgb dropped to 9.1 gm% on 03/08/2021. On 03/09/2021, Flex Sig revealed severe Diverticulosis in the Sigmoid & Descending Colon w/clot, but No active bleeding. Two hemostatic clips were placed during Flex Sig. Patient was felt stable for  Discharge & f/u as out-patient recommending may restart Eliquis on May 1st.  CBC on 03/10/2021 was Hgb 10.3 gm%.       Hospitalization discharge instructions and medications are reconciled with the patient & daughter.      Patient is also followed with Hypertension, Hyperlipidemia, Pre-Diabetes and Vitamin D Deficiency.       Patient is treated for HTN & BP has been controlled at home. Today's BP: 128/74. Patient has had no complaints of any cardiac type chest pain, palpitations, dyspnea/orthopnea/PND, dizziness, claudication, or dependent edema.     Hyperlipidemia is controlled with diet & meds. Patient denies myalgias or other med SE's. Last Lipids were not at goal: Lab Results  Component Value Date   CHOL 206 (H) 03/05/2021   HDL 58 03/05/2021   LDLCALC 125 (H) 03/05/2021   TRIG 115 03/05/2021   CHOLHDL 3.6 03/05/2021      Also, the patient has history of T2_NIDDM PreDiabetes and has had no symptoms of reactive hypoglycemia, diabetic polys, paresthesias or visual blurring.  Last A1c was not at goal:  Lab Results  Component Value Date   HGBA1C 7.1 (H) 03/05/2021      Further, the patient also has history of Vitamin D Deficiency and supplements vitamin D without any suspected side-effects. Last vitamin D was  At goal:  Lab Results  Component Value Date   VD25OH 102 (H) 03/05/2021   Current Outpatient Medications on File Prior to Visit  Medication Sig  . acetaminophen (TYLENOL) 500 MG tablet Take 500 mg by mouth every 6 (six) hours as needed for moderate pain.  Marland Kitchen amiodarone (PACERONE) 200 MG tablet Take 1 tablet by mouth once daily  . amLODipine (NORVASC) 2.5 MG tablet Take 1 tablet (2.5 mg total) by mouth daily.  Derrill Memo ON 03/16/2021] apixaban (ELIQUIS) 2.5 MG TABS tablet Take 1 tablet (2.5 mg total) by mouth  2 (two) times daily.  Marland Kitchen REFRESH DIGITAL - Place 1 drop into the right eye daily as needed (dry eyes).  Marland Kitchen VITAMIN D  1000  u Take 1,000 Units 2 (two) times daily.  . B-12 SL Place 1 tablet under the tongue daily.  . diazepam  2 MG tablet Take  1 tablet 3 to 4  x /day  with Meals  for Tremor  . Furosemide  80 MG tablet Take 1 tablet 2 (two) times daily.  Marland Kitchen PRESERVISION AREDS 2  Take 1 capsule  2  times daily.  Marland Kitchen PATADAY Place 1 drop into the left eye daily.  . polyethylene glycol  17 g  packet Take 17 g daily.  . potassium chloride SA  20 MEQ tablet Take 1 tablet  2  times daily.  Marland Kitchen PROAIR HFA  inhaler Inhale 2 puffs  every 6  hours as needed   . VITAMIN B-6  Take 1 capsule  daily.  Marland Kitchen zinc  50 MG tablet Take  daily.    Allergies  Allergen Reactions  . Accupril [Quinapril Hcl] Cough  . Neosporin [Neomycin-Bacitracin Zn-Polymyx]     unknown  . Prednisone     Agitated and shaky  . Reglan [Metoclopramide] Other (See Comments)    tremors  . Tramadol Nausea And Vomiting  . Adhesive [Tape] Rash   PMHx:   Past Medical History:  Diagnosis Date  . Acute on chronic diastolic (congestive) heart failure ([place]) 11/21/2018  . Cancer ([place])    skin cancer  . DDD (degenerative disc disease)   . Diverticula of colon   . Diverticulitis   . DJD (degenerative joint disease)   . GERD (gastroesophageal reflux disease)    takes ranitidine  . Heart murmur   . History of hiatal hernia   . History of pneumonia   . Hx of irritable bowel syndrome   . Hyperlipidemia   . Hypertension   . Occasional tremors   . Osteoporosis   . Pre-diabetes   . Varicose veins   . Vitamin D deficiency    Immunization History  Administered Date(s) Administered  . DT (Pediatric) 09/05/2015  . DTaP 11/16/2004  . Influenza Whole 08/30/2013  . Influenza, High Dose Seasonal PF 08/28/2015, 08/10/2016, 11/20/2019, 09/10/2020  . Pneumococcal Conjugate-13 10/19/2016  . Pneumococcal Polysaccharide-23 11/16/2001  . Zoster 06/01/2013   Past Surgical History:  Procedure Laterality Date  . APPENDECTOMY    . BREAST EXCISIONAL BIOPSY Right   . BREAST SURGERY Right    fluid drained from breast  . CARDIOVERSION N/A 12/20/2018   CARDIOVERSION;  Buford Dresser, MD;    . CARDIOVERSION N/A 06/16/2019    CARDIOVERSION;  Surgeon: Fay Records, MD;   . COLONOSCOPY    . ESOPHAGOGASTRODUODENOSCOPY    . FLEXIBLE SIGMOIDOSCOPY N/A 03/09/2021   Procedure: FLEXIBLE SIGMOIDOSCOPY;  Mauri Pole, MD;    .  HEMOSTASIS CLIP PLACEMENT  03/09/2021   Procedure: HEMOSTASIS CLIP PLACEMENT;  Surgeon: Mauri Pole, MD;    . JOINT REPLACEMENT Left    left hip  . LUMBAR LAMINECTOMY/DECOMPRESSION MICRODISCECTOMY N/A 06/05/2015   Procedure: L3-L5 DECOMPRESSION ; Melina Schools, MD  . OPEN REDUCTION INTERNAL FIXATION (ORIF) DISTAL RADIAL FRACTURE Right 01/10/2014   Procedure: OPEN REDUCTION INTERNAL FIXATION (ORIF) DISTAL RADIAL FRACTURE;  Surgeon: Linna Hoff, MD  . SPINAL CORD DECOMPRESSION  06/05/2015   L3 L 5  . THYROID SURGERY    . TONSILLECTOMY    . TUBAL LIGATION    .  varicose veins stripped     FHx:    Reviewed / unchanged  SHx:    Reviewed / unchanged  Systems Review:  Constitutional: Denies fever, chills, wt changes, headaches, insomnia, fatigue, night sweats, change in appetite. Eyes: Denies redness, blurred vision, diplopia, discharge, itchy, watery eyes.  ENT: Denies discharge, congestion, post nasal drip, epistaxis, sore throat, earache, hearing loss, dental pain, tinnitus, vertigo, sinus pain, snoring.  CV: Denies chest pain, palpitations, irregular heartbeat, syncope, dyspnea, diaphoresis, orthopnea, PND, claudication or edema. Respiratory: denies cough, dyspnea, DOE, pleurisy, hoarseness, laryngitis, wheezing.  Gastrointestinal: Denies dysphagia, odynophagia, heartburn, reflux, water brash, abdominal pain or cramps, nausea, vomiting, bloating, diarrhea, constipation, hematemesis, melena, hematochezia  or hemorrhoids. Genitourinary: Denies dysuria, frequency, urgency, nocturia, hesitancy, discharge, hematuria or flank pain. Musculoskeletal: Denies arthralgias, myalgias, stiffness, jt. swelling, pain, limping or strain/sprain.  Skin: Denies pruritus, rash, hives, warts, acne, eczema or change in skin lesion(s). Neuro: No weakness, tremor, incoordination, spasms, paresthesia or pain. Psychiatric: Denies confusion, memory loss or sensory loss. Endo: Denies change in weight, skin or  hair change.  Heme/Lymph: No excessive bleeding, bruising or enlarged lymph nodes.  Physical Exam  BP 128/74   Pulse 72   Temp (!) 97.5 F (36.4 C)   Resp 16   Ht 5\' 1"  (1.549 m)   Wt 143 lb 1.6 oz (64.9 kg)   SpO2 94%   BMI 27.04 kg/m   Appears well nourished, well groomed  and in no distress.  Eyes: PERRLA, EOMs, conjunctiva no swelling or erythema. Sinuses: No frontal/maxillary tenderness ENT/Mouth: EAC's clear, TM's nl w/o erythema, bulging. Nares clear w/o erythema, swelling, exudates. Oropharynx clear without erythema or exudates. Oral hygiene is good. Tongue normal, non obstructing. Hearing intact.  Neck: Supple. Thyroid nl. Car 2+/2+ without bruits, nodes or JVD. Chest: Respirations nl with BS clear & equal w/o rales, rhonchi, wheezing or stridor.  Cor: Heart sounds normal w/ regular rate and rhythm without sig. murmurs, gallops, clicks or rubs. Peripheral pulses normal and equal  without edema.  Abdomen: Soft & bowel sounds normal. Non-tender w/o guarding, rebound, hernias, masses or organomegaly.  Lymphatics: Unremarkable.  Musculoskeletal: Full ROM all peripheral extremities, joint stability, 5/5 strength and normal gait.  Skin: Warm, dry without exposed rashes, lesions or ecchymosis apparent.  Neuro: Cranial nerves intact, reflexes equal bilaterally. Sensory-motor testing grossly intact. Tendon reflexes grossl intact.  Pysch: Alert & oriented x 3.  Insight and judgement nl & appropriate. No ideations.  Assessment and Plan:   1. Diverticular hemorrhage  - CBC with Differential/Platelet  2. Lower GI bleed  - CBC with Differential/Platelet  3. Rectal bleeding  - CBC with Differential/Platelet  4. Essential hypertension  - Continue medication, monitor blood pressure at home.  - Continue DASH diet.  Reminder to go to the ER if any CP,  SOB, nausea, dizziness, severe HA, changes vision/speech.  - CBC with Differential/Platelet - COMPLETE METABOLIC PANEL WITH  GFR  5. Chronic diastolic heart failure ([place])   6. Hyperlipidemia associated with type 2 diabetes mellitus ([place])  - Continue diet/meds, exercise,& lifestyle modifications.  - Continue monitor periodic cholesterol/liver & renal functions    7. Type 2 diabetes mellitus with stage 3a chronic kidney disease ([place])   8. Gastroesophageal reflux disease   9. Medication management  - CBC with Differential/Platelet - COMPLETE METABOLIC PANEL WITH GFR        Discussed  regular exercise, BP monitoring, weight control to achieve/maintain BMI less than 25 and discussed meds and SE's.  Recommended labs to assess and monitor clinical status with further disposition pending results of labs. Over 30 minutes of exam, counseling, chart review was performed.   Kirtland Bouchard, MD

## 2021-03-11 NOTE — Patient Instructions (Signed)
Diverticulosis  Diverticulosis is a condition that develops when small pouches (diverticula) form in the wall of the large intestine (colon). The colon is where water is absorbed and stool (feces) is formed. The pouches form when the inside layer of the colon pushes through weak spots in the outer layers of the colon. You may have a few pouches or many of them. The pouches usually do not cause problems unless they become inflamed or infected. When this happens, the condition is called diverticulitis. What are the causes? The cause of this condition is not known. What increases the risk? The following factors may make you more likely to develop this condition:  Being older than age 11. Your risk for this condition increases with age. Diverticulosis is rare among people younger than age 49. By age 67, many people have it.  Eating a low-fiber diet.  Having frequent constipation.  Being overweight.  Not getting enough exercise.  Smoking.  Taking over-the-counter pain medicines, like aspirin and ibuprofen.  Having a family history of diverticulosis. What are the signs or symptoms? In most people, there are no symptoms of this condition. If you do have symptoms, they may include:  Bloating.  Cramps in the abdomen.  Constipation or diarrhea.  Pain in the lower left side of the abdomen. How is this diagnosed? Because diverticulosis usually has no symptoms, it is most often diagnosed during an exam for other colon problems. The condition may be diagnosed by:  Using a flexible scope to examine the colon (colonoscopy).  Taking an X-ray of the colon after dye has been put into the colon (barium enema).  Having a CT scan. How is this treated? You may not need treatment for this condition. Your health care provider may recommend treatment to prevent problems. You may need treatment if you have symptoms or if you previously had diverticulitis. Treatment may include:  Eating a high-fiber  diet.  Taking a fiber supplement.  Taking a live bacteria supplement (probiotic).  Taking medicine to relax your colon.  Follow these instructions at home: Medicines  Take over-the-counter and prescription medicines only as told by your health care provider.  If told by your health care provider, take a fiber supplement or probiotic.   General instructions  Try not to strain when you have a bowel movement.  Keep all follow-up visits as told by your health care provider. This is important. Contact a health care provider if you:  Have pain in your abdomen.  Have bloating.  Have cramps.  Have not had a bowel movement in 3 days. Get help right away if:  Your pain gets worse.  Your bloating becomes very bad.  You have a fever or chills, and your symptoms suddenly get worse.  You vomit.  You have bowel movements that are bloody or black.  You have bleeding from your rectum. Summary  Diverticulosis is a condition that develops when small pouches (diverticula) form in the wall of the large intestine (colon).  You may have a few pouches or many of them.  This condition is most often diagnosed during an exam for other colon problems.  Treatment may include increasing the fiber in your diet, taking supplements, or taking medicines.   =============================  Lower Gastrointestinal Bleeding  Lower gastrointestinal (GI) bleeding is the result of bleeding from the colon, the rectum, or the area of the opening of the buttocks (anal area). The colon is the last part of the digestive tract, where stool (feces) is formed. A  person with lower GI bleeding may see blood in or on his or her stool. The blood may be bright red. Lower GI bleeding often stops without treatment. Continued or heavy bleeding may be life-threatening and needs emergency treatment at a hospital. What are the causes? This condition may be caused by:  Diverticulosis. This condition causes pouches to  form in your colon over time.  Diverticulitis. This is inflammation in areas where diverticulosis has occurred.  Inflammatory bowel disease (IBD), or inflammation of the colon.  Hemorrhoids, or swollen veins in the rectum.  Anal fissures, or painful tears in the anus. Tears are often caused by passing hard stools.  Cancer of the colon or rectum, or polyps of the colon or rectum. Polyps are noncancerous growths.  Coagulopathy. This is a disorder that makes it hard to form blood clots and causes easy bleeding.  Arteriovenous malformation. This is an abnormal weakening of a blood vessel where an artery and a vein come together. What increases the risk? The following factors may make you more likely to develop this condition:  Being older than age 51.  Regular use of aspirin or NSAIDs, such as ibuprofen.  Taking blood thinners (anticoagulants) or anti-platelet medicines.  Having a history of high-dose X-ray treatment (radiation therapy) of the colon.  Having recently had a colon polyp removed.  Heavy use of alcohol, or use of nicotine products. What are the signs or symptoms? Symptoms of this condition include:  Bright red blood or blood clots coming from your rectum.  Bloody stools.  Black or maroon-colored stools.  Pain or cramping in your abdomen.  Weakness or dizziness.  Racing heartbeat. How is this diagnosed? This condition may be diagnosed based on:  Your symptoms and medical history.  A physical exam. During the exam, your health care provider will check for signs of blood loss, such as low blood pressure and a fast pulse.  Tests or procedures. These may include: ? Flexible sigmoidoscopy. In this procedure, a flexible tube with a camera on the end is used to examine your anus and the first part of your colon to look for the source of bleeding. ? Colonoscopy. This is similar to a flexible sigmoidoscopy, but the camera can extend all the way to the uppermost part  of your colon. ? Blood tests to measure your red blood cell count and to check for coagulopathy. ? Angiogram. For this test, X-rays of your colon are taken after a dye or radioactive substance is injected into your bloodstream. This may be done to look for a bleeding site. How is this treated? Treatment for this condition depends on the cause of your bleeding. Heavy or ongoing bleeding is treated at a hospital. Treatment may include:  Getting fluids through an IV inserted into one of your veins.  Getting blood through an IV (blood transfusion).  Stopping bleeding with high heat (coagulation), injections of certain medicines, or surgical clips. This can all be done during a colonoscopy.  Having a procedure that involves first doing an angiogram and then blocking blood flow to the bleeding site (embolization).  Stopping some of your regular medicines for a certain amount of time.  Having surgery to remove part of your colon. This may be needed if bleeding is severe and does not lessen after other treatment. Follow these instructions at home: Eating and drinking  Eat foods that are high in fiber. This will help keep your stools soft. These foods include whole grains, legumes, fruits, and vegetables. Eating  1-3 prunes a day works well for many people.  Drink enough fluid to keep your urine pale yellow.  Do not drink alcohol.  General instructions  Do not use any products that contain nicotine or tobacco, such as cigarettes, e-cigarettes, and chewing tobacco. If you need help quitting, ask your health care provider.  Take over-the-counter and prescription medicines only as told by your health care provider. You may need to avoid aspirin, NSAIDs, or other medicines that increase bleeding.  Return to your normal activities as told by your health care provider. Ask your health care provider what activities are safe for you.  Keep all follow-up visits as told by your health care provider.  This is important.  Contact a health care provider if:  Your symptoms do not improve.  You feel nauseous, or you vomit.  You have pain in your abdomen.  You feel weak or dizzy.  You need help to stop smoking or drinking alcohol. Get help right away if:  Your bleeding increases.  You have severe weakness or you faint.  You have sudden or severe cramps in your back or abdomen.  You vomit blood.  You pass large blood clots in your stool.  Any of your other symptoms get worse. These symptoms may represent a serious problem that is an emergency. Do not wait to see if the symptoms will go away. Get medical help right away. Call your local emergency services (911 in the [place] Do not drive yourself to the hospital. Summary  If you have lower gastrointestinal (GI) bleeding, you may see blood in or on your stool (feces). The blood may be bright red.  Take over-the-counter and prescription medicines only as told by your health care provider. You may need to avoid aspirin, NSAIDs, or other medicines that increase bleeding.  Keep all follow-up visits as told by your health care provider. This is important.  Get help right away if your symptoms get worse.

## 2021-03-11 NOTE — Telephone Encounter (Signed)
Called patient on 03/11/2021 , 9:50 AM in an attempt to reach the patient for a hospital follow up. Spoke with the patient's daughter. She states the patient is improved.  Admit date: 03/06/21 Discharge: 03/10/21   She does not have any questions or concerns about medications from the hospital admission. The patient's medications were reviewed over the phone, they were counseled to bring in all current medications to the hospital follow up visit.   I advised the patient to call if any questions or concerns arise about the hospital admission or medications. Patient was advised to hold Eliquis until 03/16/2021 and all other medication remain the same.   Home health was started in the hospital. Physical therapy resumed and to visit patient this morning.  All questions were answered and a follow up appointment was made. Patient has an appointment 03/12/2021 with Dr Melford Aase.  Prior to Admission medications   Medication Sig Start Date End Date Taking? Authorizing Provider  acetaminophen (TYLENOL) 500 MG tablet Take 500 mg by mouth every 6 (six) hours as needed for moderate pain.    [provider]  amiodarone (PACERONE) 200 MG tablet Take 1 tablet by mouth once daily 02/24/21   Troy Sine, MD  amLODipine (NORVASC) 2.5 MG tablet Take 1 tablet (2.5 mg total) by mouth daily. 02/21/21   Lendon Colonel, NP  apixaban (ELIQUIS) 2.5 MG TABS tablet Take 1 tablet (2.5 mg total) by mouth 2 (two) times daily. 03/16/21   Hosie Poisson, MD  Carboxymeth-Glycerin-Polysorb (REFRESH DIGITAL OP) Place 1 drop into the right eye daily as needed (dry eyes).    [provider]  cholecalciferol (VITAMIN D3) 25 MCG (1000 UT) tablet Take 1,000 Units by mouth 2 (two) times daily.    [provider]  Cyanocobalamin (B-12 SL) Place 1 tablet under the tongue daily.    [provider]  diazepam (VALIUM) 2 MG tablet Take  1 tablet 3 to 4  x /day  with Meals  for Tremor 03/10/21   Hosie Poisson,  MD  furosemide (LASIX) 80 MG tablet Take 1 tablet (80 mg total) by mouth 2 (two) times daily. 02/25/21   Troy Sine, MD  Misc Natural Products (OSTEO BI-FLEX JOINT SHIELD PO) Take 1 tablet by mouth 2 (two) times daily.     [provider]  Multiple Vitamins-Minerals (PRESERVISION AREDS 2 PO) Take 1 capsule by mouth 2 (two) times daily.    [provider]  Olopatadine HCl (PATADAY OP) Place 1 drop into the left eye daily.    [provider]  polyethylene glycol (MIRALAX / GLYCOLAX) 17 g packet Take 17 g by mouth daily. 03/11/21   Hosie Poisson, MD  potassium chloride SA (KLOR-CON) 20 MEQ tablet Take 1 tablet (20 mEq total) by mouth 2 (two) times daily. 01/28/21   Hongalgi, Lenis Dickinson, MD  PROAIR HFA 108 [phone] Base) MCG/ACT inhaler Inhale 2 puffs into the lungs every 6 (six) hours as needed for wheezing or shortness of breath. 01/28/21   Hongalgi, Lenis Dickinson, MD  Pyridoxine HCl (VITAMIN B-6 PO) Take 1 capsule by mouth daily.    [provider]  zinc gluconate 50 MG tablet Take 50 mg by mouth daily.    [provider]

## 2021-03-12 ENCOUNTER — Encounter: Payer: Self-pay | Admitting: Internal Medicine

## 2021-03-12 ENCOUNTER — Telehealth: Payer: Self-pay | Admitting: *Deleted

## 2021-03-12 ENCOUNTER — Encounter: Payer: Self-pay | Admitting: *Deleted

## 2021-03-12 ENCOUNTER — Ambulatory Visit (INDEPENDENT_AMBULATORY_CARE_PROVIDER_SITE_OTHER): Payer: Medicare Other | Admitting: Internal Medicine

## 2021-03-12 ENCOUNTER — Other Ambulatory Visit: Payer: Self-pay

## 2021-03-12 VITALS — BP 128/74 | HR 72 | Temp 97.5°F | Resp 16 | Ht 61.0 in | Wt 143.1 lb

## 2021-03-12 DIAGNOSIS — I10 Essential (primary) hypertension: Secondary | ICD-10-CM

## 2021-03-12 DIAGNOSIS — E1122 Type 2 diabetes mellitus with diabetic chronic kidney disease: Secondary | ICD-10-CM

## 2021-03-12 DIAGNOSIS — N1831 Chronic kidney disease, stage 3a: Secondary | ICD-10-CM | POA: Diagnosis not present

## 2021-03-12 DIAGNOSIS — K625 Hemorrhage of anus and rectum: Secondary | ICD-10-CM | POA: Diagnosis not present

## 2021-03-12 DIAGNOSIS — N3000 Acute cystitis without hematuria: Secondary | ICD-10-CM

## 2021-03-12 DIAGNOSIS — E1169 Type 2 diabetes mellitus with other specified complication: Secondary | ICD-10-CM

## 2021-03-12 DIAGNOSIS — K2100 Gastro-esophageal reflux disease with esophagitis, without bleeding: Secondary | ICD-10-CM

## 2021-03-12 DIAGNOSIS — K5731 Diverticulosis of large intestine without perforation or abscess with bleeding: Secondary | ICD-10-CM

## 2021-03-12 DIAGNOSIS — I5032 Chronic diastolic (congestive) heart failure: Secondary | ICD-10-CM | POA: Diagnosis not present

## 2021-03-12 DIAGNOSIS — K922 Gastrointestinal hemorrhage, unspecified: Secondary | ICD-10-CM | POA: Diagnosis not present

## 2021-03-12 DIAGNOSIS — Z79899 Other long term (current) drug therapy: Secondary | ICD-10-CM

## 2021-03-12 DIAGNOSIS — E785 Hyperlipidemia, unspecified: Secondary | ICD-10-CM

## 2021-03-12 NOTE — Telephone Encounter (Signed)
Returned call to Laura,PT with [employer], in regard to PT orders. Per Dr Melford Aase, [facility] to provide PT 2 times a week x 1 week and 1 time a week for 3 weeks.

## 2021-03-13 DIAGNOSIS — K5730 Diverticulosis of large intestine without perforation or abscess without bleeding: Secondary | ICD-10-CM | POA: Diagnosis not present

## 2021-03-13 DIAGNOSIS — D631 Anemia in chronic kidney disease: Secondary | ICD-10-CM | POA: Diagnosis not present

## 2021-03-13 DIAGNOSIS — I2510 Atherosclerotic heart disease of native coronary artery without angina pectoris: Secondary | ICD-10-CM | POA: Diagnosis not present

## 2021-03-13 DIAGNOSIS — E1122 Type 2 diabetes mellitus with diabetic chronic kidney disease: Secondary | ICD-10-CM | POA: Diagnosis not present

## 2021-03-13 DIAGNOSIS — D62 Acute posthemorrhagic anemia: Secondary | ICD-10-CM | POA: Diagnosis not present

## 2021-03-13 DIAGNOSIS — E1165 Type 2 diabetes mellitus with hyperglycemia: Secondary | ICD-10-CM | POA: Diagnosis not present

## 2021-03-13 DIAGNOSIS — E785 Hyperlipidemia, unspecified: Secondary | ICD-10-CM | POA: Diagnosis not present

## 2021-03-13 DIAGNOSIS — N1831 Chronic kidney disease, stage 3a: Secondary | ICD-10-CM | POA: Diagnosis not present

## 2021-03-13 DIAGNOSIS — N390 Urinary tract infection, site not specified: Secondary | ICD-10-CM | POA: Diagnosis not present

## 2021-03-13 DIAGNOSIS — M150 Primary generalized (osteo)arthritis: Secondary | ICD-10-CM | POA: Diagnosis not present

## 2021-03-13 DIAGNOSIS — I130 Hypertensive heart and chronic kidney disease with heart failure and stage 1 through stage 4 chronic kidney disease, or unspecified chronic kidney disease: Secondary | ICD-10-CM | POA: Diagnosis not present

## 2021-03-13 DIAGNOSIS — M810 Age-related osteoporosis without current pathological fracture: Secondary | ICD-10-CM | POA: Diagnosis not present

## 2021-03-13 DIAGNOSIS — M519 Unspecified thoracic, thoracolumbar and lumbosacral intervertebral disc disorder: Secondary | ICD-10-CM | POA: Diagnosis not present

## 2021-03-13 DIAGNOSIS — I4891 Unspecified atrial fibrillation: Secondary | ICD-10-CM | POA: Diagnosis not present

## 2021-03-13 DIAGNOSIS — I5032 Chronic diastolic (congestive) heart failure: Secondary | ICD-10-CM | POA: Diagnosis not present

## 2021-03-13 DIAGNOSIS — Z9181 History of falling: Secondary | ICD-10-CM | POA: Diagnosis not present

## 2021-03-13 DIAGNOSIS — Z7901 Long term (current) use of anticoagulants: Secondary | ICD-10-CM | POA: Diagnosis not present

## 2021-03-13 LAB — URINALYSIS, ROUTINE W REFLEX MICROSCOPIC
Bilirubin Urine: NEGATIVE
Glucose, UA: NEGATIVE
Hgb urine dipstick: NEGATIVE
Ketones, ur: NEGATIVE
Leukocytes,Ua: NEGATIVE
Nitrite: NEGATIVE
Protein, ur: NEGATIVE
Specific Gravity, Urine: 1.008 (ref 1.001–1.035)
pH: 6 (ref 5.0–8.0)

## 2021-03-13 LAB — CBC WITH DIFFERENTIAL/PLATELET
Absolute Monocytes: 523 cells/uL (ref 200–950)
Basophils Absolute: 72 cells/uL (ref 0–200)
Basophils Relative: 1.3 %
Eosinophils Absolute: 479 cells/uL (ref 15–500)
Eosinophils Relative: 8.7 %
HCT: 33.7 % — ABNORMAL LOW (ref 35.0–45.0)
Hemoglobin: 10.7 g/dL — ABNORMAL LOW (ref 11.7–15.5)
Lymphs Abs: 622 cells/uL — ABNORMAL LOW (ref 850–3900)
MCH: 28.8 pg (ref 27.0–33.0)
MCHC: 31.8 g/dL — ABNORMAL LOW (ref 32.0–36.0)
MCV: 90.8 fL (ref 80.0–100.0)
MPV: 10.8 fL (ref 7.5–12.5)
Monocytes Relative: 9.5 %
Neutro Abs: 3806 cells/uL (ref 1500–7800)
Neutrophils Relative %: 69.2 %
Platelets: 284 10*3/uL (ref 140–400)
RBC: 3.71 10*6/uL — ABNORMAL LOW (ref 3.80–5.10)
RDW: 14.1 % (ref 11.0–15.0)
Total Lymphocyte: 11.3 %
WBC: 5.5 10*3/uL (ref 3.8–10.8)

## 2021-03-13 LAB — COMPLETE METABOLIC PANEL WITH GFR
AG Ratio: 1.9 (calc) (ref 1.0–2.5)
ALT: 17 U/L (ref 6–29)
AST: 26 U/L (ref 10–35)
Albumin: 3.9 g/dL (ref 3.6–5.1)
Alkaline phosphatase (APISO): 96 U/L (ref 37–153)
BUN/Creatinine Ratio: 9 (calc) (ref 6–22)
BUN: 15 mg/dL (ref 7–25)
CO2: 29 mmol/L (ref 20–32)
Calcium: 9.6 mg/dL (ref 8.6–10.4)
Chloride: 102 mmol/L (ref 98–110)
Creat: 1.61 mg/dL — ABNORMAL HIGH (ref 0.60–0.88)
GFR, Est African American: 33 mL/min/{1.73_m2} — ABNORMAL LOW (ref >=60)
GFR, Est Non African American: 29 mL/min/{1.73_m2} — ABNORMAL LOW (ref >=60)
Globulin: 2.1 g/dL (calc) (ref 1.9–3.7)
Glucose, Bld: 177 mg/dL — ABNORMAL HIGH (ref 65–99)
Potassium: 3.5 mmol/L (ref 3.5–5.3)
Sodium: 140 mmol/L (ref 135–146)
Total Bilirubin: 0.5 mg/dL (ref 0.2–1.2)
Total Protein: 6 g/dL — ABNORMAL LOW (ref 6.1–8.1)

## 2021-03-13 LAB — URINE CULTURE
MICRO NUMBER:: 11821804
Result:: NO GROWTH
SPECIMEN QUALITY:: ADEQUATE

## 2021-03-13 NOTE — Progress Notes (Signed)
============================================================ ============================================================  -    Urine Culture returned OK - No more infection !  ============================================================ ============================================================

## 2021-03-13 NOTE — Progress Notes (Signed)
============================================================ ============================================================  -    U/A - looks totally clear like No Infection now   - Culture will take 2 -3 days to get back ============================================================ ============================================================  -  CBC great - Red Cell Ct - Hgb up to 10.7 - better ============================================================ ============================================================  -  Kidney functions stable - electrolytes & Potassium - Normal / OK ============================================================ ============================================================

## 2021-03-18 DIAGNOSIS — E1122 Type 2 diabetes mellitus with diabetic chronic kidney disease: Secondary | ICD-10-CM | POA: Diagnosis not present

## 2021-03-18 DIAGNOSIS — N390 Urinary tract infection, site not specified: Secondary | ICD-10-CM | POA: Diagnosis not present

## 2021-03-18 DIAGNOSIS — I130 Hypertensive heart and chronic kidney disease with heart failure and stage 1 through stage 4 chronic kidney disease, or unspecified chronic kidney disease: Secondary | ICD-10-CM | POA: Diagnosis not present

## 2021-03-18 DIAGNOSIS — D631 Anemia in chronic kidney disease: Secondary | ICD-10-CM | POA: Diagnosis not present

## 2021-03-18 DIAGNOSIS — E785 Hyperlipidemia, unspecified: Secondary | ICD-10-CM | POA: Diagnosis not present

## 2021-03-18 DIAGNOSIS — D62 Acute posthemorrhagic anemia: Secondary | ICD-10-CM | POA: Diagnosis not present

## 2021-03-18 DIAGNOSIS — N1831 Chronic kidney disease, stage 3a: Secondary | ICD-10-CM | POA: Diagnosis not present

## 2021-03-18 DIAGNOSIS — E1165 Type 2 diabetes mellitus with hyperglycemia: Secondary | ICD-10-CM | POA: Diagnosis not present

## 2021-03-18 DIAGNOSIS — K5730 Diverticulosis of large intestine without perforation or abscess without bleeding: Secondary | ICD-10-CM | POA: Diagnosis not present

## 2021-03-18 DIAGNOSIS — Z9181 History of falling: Secondary | ICD-10-CM | POA: Diagnosis not present

## 2021-03-18 DIAGNOSIS — I2510 Atherosclerotic heart disease of native coronary artery without angina pectoris: Secondary | ICD-10-CM | POA: Diagnosis not present

## 2021-03-18 DIAGNOSIS — Z7901 Long term (current) use of anticoagulants: Secondary | ICD-10-CM | POA: Diagnosis not present

## 2021-03-18 DIAGNOSIS — M519 Unspecified thoracic, thoracolumbar and lumbosacral intervertebral disc disorder: Secondary | ICD-10-CM | POA: Diagnosis not present

## 2021-03-18 DIAGNOSIS — I5032 Chronic diastolic (congestive) heart failure: Secondary | ICD-10-CM | POA: Diagnosis not present

## 2021-03-18 DIAGNOSIS — I4891 Unspecified atrial fibrillation: Secondary | ICD-10-CM | POA: Diagnosis not present

## 2021-03-18 DIAGNOSIS — M150 Primary generalized (osteo)arthritis: Secondary | ICD-10-CM | POA: Diagnosis not present

## 2021-03-18 DIAGNOSIS — M810 Age-related osteoporosis without current pathological fracture: Secondary | ICD-10-CM | POA: Diagnosis not present

## 2021-03-19 DIAGNOSIS — I5032 Chronic diastolic (congestive) heart failure: Secondary | ICD-10-CM | POA: Diagnosis not present

## 2021-03-19 DIAGNOSIS — E785 Hyperlipidemia, unspecified: Secondary | ICD-10-CM | POA: Diagnosis not present

## 2021-03-19 DIAGNOSIS — M810 Age-related osteoporosis without current pathological fracture: Secondary | ICD-10-CM | POA: Diagnosis not present

## 2021-03-19 DIAGNOSIS — Z9181 History of falling: Secondary | ICD-10-CM | POA: Diagnosis not present

## 2021-03-19 DIAGNOSIS — E1165 Type 2 diabetes mellitus with hyperglycemia: Secondary | ICD-10-CM | POA: Diagnosis not present

## 2021-03-19 DIAGNOSIS — D631 Anemia in chronic kidney disease: Secondary | ICD-10-CM | POA: Diagnosis not present

## 2021-03-19 DIAGNOSIS — D62 Acute posthemorrhagic anemia: Secondary | ICD-10-CM | POA: Diagnosis not present

## 2021-03-19 DIAGNOSIS — I130 Hypertensive heart and chronic kidney disease with heart failure and stage 1 through stage 4 chronic kidney disease, or unspecified chronic kidney disease: Secondary | ICD-10-CM | POA: Diagnosis not present

## 2021-03-19 DIAGNOSIS — M150 Primary generalized (osteo)arthritis: Secondary | ICD-10-CM | POA: Diagnosis not present

## 2021-03-19 DIAGNOSIS — Z7901 Long term (current) use of anticoagulants: Secondary | ICD-10-CM | POA: Diagnosis not present

## 2021-03-19 DIAGNOSIS — N1831 Chronic kidney disease, stage 3a: Secondary | ICD-10-CM | POA: Diagnosis not present

## 2021-03-19 DIAGNOSIS — M519 Unspecified thoracic, thoracolumbar and lumbosacral intervertebral disc disorder: Secondary | ICD-10-CM | POA: Diagnosis not present

## 2021-03-19 DIAGNOSIS — I2510 Atherosclerotic heart disease of native coronary artery without angina pectoris: Secondary | ICD-10-CM | POA: Diagnosis not present

## 2021-03-19 DIAGNOSIS — I4891 Unspecified atrial fibrillation: Secondary | ICD-10-CM | POA: Diagnosis not present

## 2021-03-19 DIAGNOSIS — N390 Urinary tract infection, site not specified: Secondary | ICD-10-CM | POA: Diagnosis not present

## 2021-03-19 DIAGNOSIS — E1122 Type 2 diabetes mellitus with diabetic chronic kidney disease: Secondary | ICD-10-CM | POA: Diagnosis not present

## 2021-03-19 DIAGNOSIS — K5730 Diverticulosis of large intestine without perforation or abscess without bleeding: Secondary | ICD-10-CM | POA: Diagnosis not present

## 2021-03-20 ENCOUNTER — Other Ambulatory Visit: Payer: Self-pay | Admitting: *Deleted

## 2021-03-20 MED ORDER — POTASSIUM CHLORIDE CRYS ER 20 MEQ PO TBCR: 20.0000 meq | EXTENDED_RELEASE_TABLET | Freq: Two times a day (BID) | ORAL | 0 refills | Status: DC

## 2021-03-21 ENCOUNTER — Inpatient Hospital Stay: Payer: Medicare Other | Admitting: Internal Medicine

## 2021-03-23 NOTE — Progress Notes (Signed)
Cardiology Clinic Note   Patient Name: Briana [name] Date of Encounter: 03/25/2021  Primary Care Provider:  Unk Pinto, MD Primary Cardiologist:  Shelva Majestic, MD  Patient Profile    Briana [name] 87 year old female presents today for follow-up of her chronic diastolic CHF, AKI, and PAF.  Past Medical History    Past Medical History:  Diagnosis Date  . Acute on chronic diastolic (congestive) heart failure ([place]) 11/21/2018  . Cancer ([place])    skin cancer  . DDD (degenerative disc disease)   . Diverticula of colon   . Diverticulitis   . DJD (degenerative joint disease)   . GERD (gastroesophageal reflux disease)    takes ranitidine  . Heart murmur   . History of hiatal hernia   . History of pneumonia   . Hx of irritable bowel syndrome   . Hyperlipidemia   . Hypertension   . Occasional tremors   . Osteoporosis   . Pre-diabetes   . Varicose veins   . Vitamin D deficiency    Past Surgical History:  Procedure Laterality Date  . APPENDECTOMY    . BREAST EXCISIONAL BIOPSY Right   . BREAST SURGERY Right    fluid drained from breast  . CARDIOVERSION N/A 12/20/2018   Procedure: CARDIOVERSION;  Surgeon: Buford Dresser, MD;  Location: [facility] ENDOSCOPY;  Service: Cardiovascular;  Laterality: N/A;  . CARDIOVERSION N/A 06/16/2019   Procedure: CARDIOVERSION;  Surgeon: Fay Records, MD;  Location: [place];  Service: Cardiovascular;  Laterality: N/A;  . COLONOSCOPY    . ESOPHAGOGASTRODUODENOSCOPY    . FLEXIBLE SIGMOIDOSCOPY N/A 03/09/2021   Procedure: FLEXIBLE SIGMOIDOSCOPY;  Surgeon: Mauri Pole, MD;  Location: [facility] ENDOSCOPY;  Service: Endoscopy;  Laterality: N/A;  . HEMOSTASIS CLIP PLACEMENT  03/09/2021   Procedure: HEMOSTASIS CLIP PLACEMENT;  Surgeon: Mauri Pole, MD;  Location: [facility] ENDOSCOPY;  Service: Endoscopy;;  . JOINT REPLACEMENT Left    left hip  . LUMBAR LAMINECTOMY/DECOMPRESSION MICRODISCECTOMY N/A 06/05/2015   Procedure: L3-L5 DECOMPRESSION ;   Surgeon: Melina Schools, MD;  Location: [place];  Service: Orthopedics;  Laterality: N/A;  . OPEN REDUCTION INTERNAL FIXATION (ORIF) DISTAL RADIAL FRACTURE Right 01/10/2014   Procedure: OPEN REDUCTION INTERNAL FIXATION (ORIF) DISTAL RADIAL FRACTURE;  Surgeon: Linna Hoff, MD;  Location: [place];  Service: Orthopedics;  Laterality: Right;  . SPINAL CORD DECOMPRESSION  06/05/2015   L3 L 5  . THYROID SURGERY    . TONSILLECTOMY    . TUBAL LIGATION    . varicose veins stripped      Allergies  Allergies  Allergen Reactions  . Accupril [Quinapril Hcl] Cough  . Neosporin [Neomycin-Bacitracin Zn-Polymyx]     unknown  . Prednisone     Agitated and shaky  . Reglan [Metoclopramide] Other (See Comments)    tremors  . Tramadol Nausea And Vomiting  . Adhesive [Tape] Rash    History of Present Illness    Briana [name] has a PMH of chronic diastolic CHF, atrial fibrillation diagnosed in 2020, GERD, hyperlipidemia, prediabetes, and IBS.  She underwent DCCV on 2/20 and again on 06/16/2019.  Her second cardioversion was complicated by significant bradycardia prompting discontinuation of her atenolol.  Her atenolol was later restarted.  Subsequent echocardiogram showed an EF of 60 to 65%.  She was last seen by me on 9/21 and was doing well at that time.  She contacted the nurse triage line on 01/09/2021 with complaints of weight gain and lower extremity swelling.  She was  instructed to take additional Bumex as needed for weight gain of 2-3 pounds overnight or 5 pounds in a single week.  She called again 01/16/2021 and reported difficulty with her breathing.  She was instructed to take an additional dose of Bumex.  After that she noted 3 days of progressive weakness and difficulty with ambulation.  Her neighbor stopped at her house to check on her 01/22/2021.  She was found on her floor and EMS was called.  She was noted to be hypoxic with an O2 saturation of 88 which improved with 2 L nasal cannula.  While in the ED she  was noted to be tachypneic.  Her potassium was 2.9.  Her initial creatinine was 2.09 (baseline 0.9).  Her WBCs were elevated at 15.6.  Her D-dimer was elevated.  She was COVID negative.  Her TSH was 0.32.  A VQ scan was negative for PE.  She was started on IV fluid with concern for dehydration.  An antibiotic was started for possible UTI.  An echocardiogram at that time showed an LVEF of 55%, moderately elevated PA pressures, mild-moderate MR.  Her home olmesartan and nadolol were held due to acute renal failure and bradycardia.  She was initially given IV fluids but subsequently placed on IV diuretic.  With IV diuresis her renal function improved.  Her apixaban was reduced to 2.5 mg due to her age and creatinine clearance.  She was discharged on amlodipine 2.5, amiodarone 200, furosemide 80 twice daily, apixaban 2.5 mg twice daily, and potassium supplementation.  Her discharge weight was 150 pounds which was down from 163 pounds.  She was seen and followed by Almyra Deforest PA-C on 01/31/2021.  Her weight at that time was 149.8 pounds.  She reported some degree of dyspnea on exertion but denied shortness of breath at rest.  She reported tenderness under bilateral breast.  It did not appear to be related to cardiac issues.  It was felt that it may be related to her bra position.  She did not have significant lower extremity edema and her lungs were clear.  Her EKG showed normal sinus rhythm.  She was admitted to the hospital 03/06/2021 with rectal bleeding, abdominal cramping.  She was noted to have a hemoglobin drop from 12-10.5-9.  GI was consulted and she underwent flexible sigmoidoscopy.  It was recommended that her Eliquis be held for 5 days.  Her repeat hemoglobin stabilized.  It was felt that her lower GI bleed was a result of diverticulosis and internal hemorrhoids.  There was evidence of a impacted diverticular.  She presents the clinic today for follow-up evaluation states she feels well.  She presents with  her daughter Briana [name].  She has been doing physical therapy 1-2 times per week.  We reviewed her 2 recent hospitalizations.  She is now using a cane when she ambulates to her mailbox or down her driveway.  She has not had further episodes of bleeding.  She denies chest pain and has been slowly increasing activity.  I will give her the salty 6 diet sheet and have her follow-up in 6 months.  Today she denies chest pain, shortness of breath, lower extremity edema, fatigue, palpitations, melena, hematuria, hemoptysis, diaphoresis, weakness, presyncope, syncope, orthopnea, and PND.   Home Medications    Prior to Admission medications   Medication Sig Start Date End Date Taking? Authorizing Provider  acetaminophen (TYLENOL) 500 MG tablet Take 500 mg by mouth every 6 (six) hours as needed for moderate pain.  [provider]  amiodarone (PACERONE) 200 MG tablet Take 1 tablet by mouth once daily 02/24/21   Troy Sine, MD  amLODipine (NORVASC) 2.5 MG tablet Take 1 tablet (2.5 mg total) by mouth daily. 02/21/21   Lendon Colonel, NP  apixaban (ELIQUIS) 2.5 MG TABS tablet Take 1 tablet (2.5 mg total) by mouth 2 (two) times daily. 03/16/21   Hosie Poisson, MD  Carboxymeth-Glycerin-Polysorb (REFRESH DIGITAL OP) Place 1 drop into the right eye daily as needed (dry eyes).    [provider]  cholecalciferol (VITAMIN D3) 25 MCG (1000 UT) tablet Take 1,000 Units by mouth 2 (two) times daily.    [provider]  Cyanocobalamin (B-12 SL) Place 1 tablet under the tongue daily.    [provider]  diazepam (VALIUM) 2 MG tablet Take  1 tablet 3 to 4  x /day  with Meals  for Tremor 03/10/21   Hosie Poisson, MD  furosemide (LASIX) 80 MG tablet Take 1 tablet (80 mg total) by mouth 2 (two) times daily. 02/25/21   Troy Sine, MD  Misc Natural Products (OSTEO BI-FLEX JOINT SHIELD PO) Take 1 tablet by mouth 2 (two) times daily.     [provider]  Multiple Vitamins-Minerals  (PRESERVISION AREDS 2 PO) Take 1 capsule by mouth 2 (two) times daily.    [provider]  Olopatadine HCl (PATADAY OP) Place 1 drop into the left eye daily.    [provider]  polyethylene glycol (MIRALAX / GLYCOLAX) 17 g packet Take 17 g by mouth daily. 03/11/21   Hosie Poisson, MD  potassium chloride SA (KLOR-CON) 20 MEQ tablet Take 1 tablet (20 mEq total) by mouth 2 (two) times daily. 03/20/21   Unk Pinto, MD  PROAIR HFA 108 [phone] Base) MCG/ACT inhaler Inhale 2 puffs into the lungs every 6 (six) hours as needed for wheezing or shortness of breath. 01/28/21   Hongalgi, Lenis Dickinson, MD  Pyridoxine HCl (VITAMIN B-6 PO) Take 1 capsule by mouth daily.    [provider]  zinc gluconate 50 MG tablet Take 50 mg by mouth daily.    [provider]    Family History    Family History  Problem Relation Age of Onset  . Hypertension Mother   . Hyperlipidemia Mother   . Heart disease Sister   . Cancer Brother        prostate   She indicated that her mother is deceased. She indicated that her father is deceased. She indicated that her sister is deceased. She indicated that her brother is alive.  Social History    Social History   Socioeconomic History  . Marital status: Widowed    Spouse name: Not on file  . Number of children: Not on file  . Years of education: Not on file  . Highest education level: Not on file  Occupational History  . Not on file  Tobacco Use  . Smoking status: Never Smoker  . Smokeless tobacco: Never Used  Substance and Sexual Activity  . Alcohol use: No  . Drug use: No  . Sexual activity: Not on file  Other Topics Concern  . Not on file  Social History Narrative  . Not on file   Social Determinants of Health   Financial Resource Strain: Not on file  Food Insecurity: Not on file  Transportation Needs: Not on file  Physical Activity: Not on file  Stress: Not on file  Social Connections: Not on file  Intimate Partner  Violence: Not on file     Review of Systems    General:  No chills, fever, night sweats or weight changes.  Cardiovascular:  No chest pain, dyspnea on exertion, edema, orthopnea, palpitations, paroxysmal nocturnal dyspnea. Dermatological: No rash, lesions/masses Respiratory: No cough, dyspnea Urologic: No hematuria, dysuria Abdominal:   No nausea, vomiting, diarrhea, bright red blood per rectum, melena, or hematemesis Neurologic:  No visual changes, wkns, changes in mental status. All other systems reviewed and are otherwise negative except as noted above.  Physical Exam    VS:  BP 130/70   Pulse 69   Ht 5\' 1"  (1.549 m)   Wt 143 lb 12.8 oz (65.2 kg)   SpO2 95%   BMI 27.17 kg/m  , BMI Body mass index is 27.17 kg/m. GEN: Well nourished, well developed, in no acute distress. HEENT: normal. Neck: Supple, no JVD, carotid bruits, or masses. Cardiac: RRR, no murmurs, rubs, or gallops. No clubbing, cyanosis, edema.  Radials/DP/PT 2+ and equal bilaterally.  Respiratory:  Respirations regular and unlabored, clear to auscultation bilaterally. GI: Soft, nontender, nondistended, BS + x 4. MS: no deformity or atrophy. Skin: warm and dry, no rash. Neuro:  Strength and sensation are intact. Psych: Normal affect.  Accessory Clinical Findings    Recent Labs: 01/22/2021: B Natriuretic Peptide 464.4 03/05/2021: Magnesium 2.2; TSH 1.01 03/12/2021: ALT 17; BUN 15; Creat 1.61; Hemoglobin 10.7; Platelets 284; Potassium 3.5; Sodium 140   Recent Lipid Panel    Component Value Date/Time   CHOL 206 (H) 03/05/2021 1458   TRIG 115 03/05/2021 1458   HDL 58 03/05/2021 1458   CHOLHDL 3.6 03/05/2021 1458   VLDL 15 05/04/2017 1339   LDLCALC 125 (H) 03/05/2021 1458    ECG personally reviewed by me today-none today.  Echocardiogram 01/24/2021 IMPRESSIONS    1. Left ventricular ejection fraction, by estimation, is 55%. The left  ventricle has normal function. The left ventricle has no regional wall   motion abnormalities. There is mild left ventricular hypertrophy. Left  ventricular diastolic parameters are  indeterminate.  2. Right ventricular systolic function is normal. The right ventricular  size is mildly enlarged. There is moderately elevated pulmonary artery  systolic pressure. The estimated right ventricular systolic pressure is  03.5 mmHg.  3. Left atrial size was moderately dilated.  4. Right atrial size was mildly dilated.  5. The mitral valve is grossly normal. Mild to moderate mitral valve  regurgitation. No evidence of mitral stenosis.  6. The aortic valve is grossly normal. Aortic valve regurgitation is not  visualized. No aortic stenosis is present.  7. The inferior vena cava is dilated in size with <50% respiratory  variability, suggesting right atrial pressure of 15 mmHg.   Assessment & Plan   1.  Chronic diastolic CHF- euvolemic today.  Weight today 143 pounds.  Echocardiogram 01/24/2021 showed EF 55%, intermediate diastolic parameters, moderately dilated left atria, mildly dilated right atria, mild-moderate mitral valve regurgitation, and no aortic stenosis. Continue furosemide,  Heart healthy low-sodium diet-salty 6 given-reviewed Increase physical activity as tolerated Daily weights   Paroxysmal atrial fibrillation- denies bleeding issues.  Cardiac unaware.CHA2DS2/VAS Stroke Risk Points6 high risk (chf,htn,age, diabetes, female) Continue apixaban, amiodarone Heart healthy low-sodium diet-salty 6 given Increase physical activity as tolerated Avoid triggers caffeine, chocolate, EtOH, dehydration etc.  Essential hypertension-BP today 130/70.  Well-controlled at home. Continue amlodipine, furosemide, potassium Heart healthy low-sodium diet-salty 6 given Increase physical activity as tolerated  AKI-creatinine 1.61 on 03/12/2021. Follows with  PCP   Disposition: Follow-up with Dr. Claiborne Billings in 6 months.  Jossie Ng. Hanz Winterhalter NP-C    03/25/2021, 3:08  PM [facility] [address] [place] [address] Office [phone] Fax [phone]  Notice: This dictation was prepared with Dragon dictation along with smaller phrase technology. Any transcriptional errors that result from this process are unintentional and may not be corrected upon review.  I spent 15 minutes examining this patient, reviewing medications, and using patient centered shared decision making involving her cardiac care.  Prior to her visit I spent greater than 20 minutes reviewing her past medical history,  medications, and prior cardiac tests.

## 2021-03-25 ENCOUNTER — Ambulatory Visit: Payer: Medicare Other | Admitting: General Practice

## 2021-03-25 ENCOUNTER — Other Ambulatory Visit: Payer: Self-pay

## 2021-03-25 ENCOUNTER — Encounter: Payer: Self-pay | Admitting: General Practice

## 2021-03-25 ENCOUNTER — Telehealth: Payer: Self-pay

## 2021-03-25 VITALS — BP 130/70 | HR 69 | Ht 61.0 in | Wt 143.8 lb

## 2021-03-25 DIAGNOSIS — I130 Hypertensive heart and chronic kidney disease with heart failure and stage 1 through stage 4 chronic kidney disease, or unspecified chronic kidney disease: Secondary | ICD-10-CM | POA: Diagnosis not present

## 2021-03-25 DIAGNOSIS — R001 Bradycardia, unspecified: Secondary | ICD-10-CM | POA: Diagnosis not present

## 2021-03-25 DIAGNOSIS — K5730 Diverticulosis of large intestine without perforation or abscess without bleeding: Secondary | ICD-10-CM | POA: Diagnosis not present

## 2021-03-25 DIAGNOSIS — Z9181 History of falling: Secondary | ICD-10-CM | POA: Diagnosis not present

## 2021-03-25 DIAGNOSIS — I10 Essential (primary) hypertension: Secondary | ICD-10-CM

## 2021-03-25 DIAGNOSIS — N390 Urinary tract infection, site not specified: Secondary | ICD-10-CM | POA: Diagnosis not present

## 2021-03-25 DIAGNOSIS — N179 Acute kidney failure, unspecified: Secondary | ICD-10-CM

## 2021-03-25 DIAGNOSIS — E1122 Type 2 diabetes mellitus with diabetic chronic kidney disease: Secondary | ICD-10-CM | POA: Diagnosis not present

## 2021-03-25 DIAGNOSIS — M810 Age-related osteoporosis without current pathological fracture: Secondary | ICD-10-CM | POA: Diagnosis not present

## 2021-03-25 DIAGNOSIS — M150 Primary generalized (osteo)arthritis: Secondary | ICD-10-CM | POA: Diagnosis not present

## 2021-03-25 DIAGNOSIS — N1831 Chronic kidney disease, stage 3a: Secondary | ICD-10-CM | POA: Diagnosis not present

## 2021-03-25 DIAGNOSIS — E1165 Type 2 diabetes mellitus with hyperglycemia: Secondary | ICD-10-CM | POA: Diagnosis not present

## 2021-03-25 DIAGNOSIS — I5032 Chronic diastolic (congestive) heart failure: Secondary | ICD-10-CM | POA: Diagnosis not present

## 2021-03-25 DIAGNOSIS — I4891 Unspecified atrial fibrillation: Secondary | ICD-10-CM | POA: Diagnosis not present

## 2021-03-25 DIAGNOSIS — I2510 Atherosclerotic heart disease of native coronary artery without angina pectoris: Secondary | ICD-10-CM | POA: Diagnosis not present

## 2021-03-25 DIAGNOSIS — I480 Paroxysmal atrial fibrillation: Secondary | ICD-10-CM | POA: Diagnosis not present

## 2021-03-25 DIAGNOSIS — E785 Hyperlipidemia, unspecified: Secondary | ICD-10-CM | POA: Diagnosis not present

## 2021-03-25 DIAGNOSIS — Z7901 Long term (current) use of anticoagulants: Secondary | ICD-10-CM | POA: Diagnosis not present

## 2021-03-25 DIAGNOSIS — D631 Anemia in chronic kidney disease: Secondary | ICD-10-CM | POA: Diagnosis not present

## 2021-03-25 DIAGNOSIS — M519 Unspecified thoracic, thoracolumbar and lumbosacral intervertebral disc disorder: Secondary | ICD-10-CM | POA: Diagnosis not present

## 2021-03-25 DIAGNOSIS — D62 Acute posthemorrhagic anemia: Secondary | ICD-10-CM | POA: Diagnosis not present

## 2021-03-25 MED ORDER — AMLODIPINE BESYLATE 2.5 MG PO TABS: 2.5000 mg | ORAL_TABLET | Freq: Every day | ORAL | 6 refills | Status: DC

## 2021-03-25 MED ORDER — APIXABAN 2.5 MG PO TABS: 2.5000 mg | ORAL_TABLET | Freq: Two times a day (BID) | ORAL | 6 refills | Status: DC

## 2021-03-25 NOTE — Telephone Encounter (Signed)
Spoke with patient to remind her that she is due for lab work prior to her follow up appointment, her follow up appt is now with Alonza Bogus, PA-C on 04/02/21 at 3 PM. Patient is aware that no appt is necessary for the lab work and she can stop by at her convenience between 7:30 AM - 5 PM. Reviewed the address, phone number and appt information with patient. Patient verbalized understanding and had no concerns at the end of the call.

## 2021-03-25 NOTE — Addendum Note (Signed)
Addended by: Waylan Rocher on: 03/25/2021 03:15 PM   Modules accepted: Orders

## 2021-03-25 NOTE — Telephone Encounter (Signed)
-----   Message from Yevette Edwards, RN sent at 03/07/2021 10:56 AM EDT ----- Regarding: Labs Patient needs repeat Hgb prior to appt on Friday, 03/28/21 with Ellouise Newer, PA-C. Lab order in epic.

## 2021-03-25 NOTE — Patient Instructions (Signed)
Medication Instructions:  The current medical regimen is effective;  continue present plan and medications as directed. Please refer to the Current Medication list given to you today.  *If you need a refill on your cardiac medications before your next appointment, please call your pharmacy*  Lab Work:   Testing/Procedures:  NONE    NONE  Special Instructions TAKE AND LOG YOUR WEIGHT DAILY-CALL WITH WEIGHT GAIN >3lb/DAY OR 5lb/WEEKLY  PLEASE READ AND FOLLOW SALTY 6-ATTACHED-1,800mg  daily  PLEASE INCREASE PHYSICAL ACTIVITY AS TOLERATED  Follow-Up: Your next appointment:  6 month(s) In Person with Shelva Majestic, MD OR IF UNAVAILABLE JESSE CLEAVER, FNP-C   Please call our office 2 months in advance to schedule this appointment   At [facility], you and your health needs are our priority.  As part of our continuing mission to provide you with exceptional heart care, we have created designated Provider Care Teams.  These Care Teams include your primary Cardiologist (physician) and Advanced Practice Providers (APPs -  Physician Assistants and Nurse Practitioners) who all work together to provide you with the care you need, when you need it.  We recommend signing up for the patient portal called "MyChart".  Sign up information is provided on this After Visit Summary.  MyChart is used to connect with patients for Virtual Visits (Telemedicine).  Patients are able to view lab/test results, encounter notes, upcoming appointments, etc.  Non-urgent messages can be sent to your provider as well.   To learn more about what you can do with MyChart, go to [email or web address].              6 SALTY THINGS TO AVOID     1,800MG  DAILY

## 2021-03-25 NOTE — Addendum Note (Signed)
Addended by: Waylan Rocher on: 03/25/2021 03:18 PM   Modules accepted: Orders

## 2021-03-26 DIAGNOSIS — K5730 Diverticulosis of large intestine without perforation or abscess without bleeding: Secondary | ICD-10-CM | POA: Diagnosis not present

## 2021-03-26 DIAGNOSIS — E1165 Type 2 diabetes mellitus with hyperglycemia: Secondary | ICD-10-CM | POA: Diagnosis not present

## 2021-03-26 DIAGNOSIS — M519 Unspecified thoracic, thoracolumbar and lumbosacral intervertebral disc disorder: Secondary | ICD-10-CM | POA: Diagnosis not present

## 2021-03-26 DIAGNOSIS — E1122 Type 2 diabetes mellitus with diabetic chronic kidney disease: Secondary | ICD-10-CM | POA: Diagnosis not present

## 2021-03-26 DIAGNOSIS — E785 Hyperlipidemia, unspecified: Secondary | ICD-10-CM | POA: Diagnosis not present

## 2021-03-26 DIAGNOSIS — I4891 Unspecified atrial fibrillation: Secondary | ICD-10-CM | POA: Diagnosis not present

## 2021-03-26 DIAGNOSIS — N1831 Chronic kidney disease, stage 3a: Secondary | ICD-10-CM | POA: Diagnosis not present

## 2021-03-26 DIAGNOSIS — D62 Acute posthemorrhagic anemia: Secondary | ICD-10-CM | POA: Diagnosis not present

## 2021-03-26 DIAGNOSIS — Z9181 History of falling: Secondary | ICD-10-CM | POA: Diagnosis not present

## 2021-03-26 DIAGNOSIS — M150 Primary generalized (osteo)arthritis: Secondary | ICD-10-CM | POA: Diagnosis not present

## 2021-03-26 DIAGNOSIS — N390 Urinary tract infection, site not specified: Secondary | ICD-10-CM | POA: Diagnosis not present

## 2021-03-26 DIAGNOSIS — D631 Anemia in chronic kidney disease: Secondary | ICD-10-CM | POA: Diagnosis not present

## 2021-03-26 DIAGNOSIS — Z7901 Long term (current) use of anticoagulants: Secondary | ICD-10-CM | POA: Diagnosis not present

## 2021-03-26 DIAGNOSIS — M810 Age-related osteoporosis without current pathological fracture: Secondary | ICD-10-CM | POA: Diagnosis not present

## 2021-03-26 DIAGNOSIS — I130 Hypertensive heart and chronic kidney disease with heart failure and stage 1 through stage 4 chronic kidney disease, or unspecified chronic kidney disease: Secondary | ICD-10-CM | POA: Diagnosis not present

## 2021-03-26 DIAGNOSIS — I2510 Atherosclerotic heart disease of native coronary artery without angina pectoris: Secondary | ICD-10-CM | POA: Diagnosis not present

## 2021-03-26 DIAGNOSIS — I5032 Chronic diastolic (congestive) heart failure: Secondary | ICD-10-CM | POA: Diagnosis not present

## 2021-03-28 ENCOUNTER — Ambulatory Visit: Payer: Medicare Other | Admitting: Physician Assistant

## 2021-03-31 ENCOUNTER — Other Ambulatory Visit (INDEPENDENT_AMBULATORY_CARE_PROVIDER_SITE_OTHER): Payer: Medicare Other

## 2021-03-31 DIAGNOSIS — E785 Hyperlipidemia, unspecified: Secondary | ICD-10-CM | POA: Diagnosis not present

## 2021-03-31 DIAGNOSIS — E1165 Type 2 diabetes mellitus with hyperglycemia: Secondary | ICD-10-CM | POA: Diagnosis not present

## 2021-03-31 DIAGNOSIS — M150 Primary generalized (osteo)arthritis: Secondary | ICD-10-CM | POA: Diagnosis not present

## 2021-03-31 DIAGNOSIS — Z7901 Long term (current) use of anticoagulants: Secondary | ICD-10-CM | POA: Diagnosis not present

## 2021-03-31 DIAGNOSIS — Z9181 History of falling: Secondary | ICD-10-CM | POA: Diagnosis not present

## 2021-03-31 DIAGNOSIS — M810 Age-related osteoporosis without current pathological fracture: Secondary | ICD-10-CM | POA: Diagnosis not present

## 2021-03-31 DIAGNOSIS — K5730 Diverticulosis of large intestine without perforation or abscess without bleeding: Secondary | ICD-10-CM | POA: Diagnosis not present

## 2021-03-31 DIAGNOSIS — E1122 Type 2 diabetes mellitus with diabetic chronic kidney disease: Secondary | ICD-10-CM | POA: Diagnosis not present

## 2021-03-31 DIAGNOSIS — D62 Acute posthemorrhagic anemia: Secondary | ICD-10-CM | POA: Diagnosis not present

## 2021-03-31 DIAGNOSIS — M519 Unspecified thoracic, thoracolumbar and lumbosacral intervertebral disc disorder: Secondary | ICD-10-CM | POA: Diagnosis not present

## 2021-03-31 DIAGNOSIS — I5032 Chronic diastolic (congestive) heart failure: Secondary | ICD-10-CM | POA: Diagnosis not present

## 2021-03-31 DIAGNOSIS — D631 Anemia in chronic kidney disease: Secondary | ICD-10-CM | POA: Diagnosis not present

## 2021-03-31 DIAGNOSIS — I4891 Unspecified atrial fibrillation: Secondary | ICD-10-CM | POA: Diagnosis not present

## 2021-03-31 DIAGNOSIS — I2510 Atherosclerotic heart disease of native coronary artery without angina pectoris: Secondary | ICD-10-CM | POA: Diagnosis not present

## 2021-03-31 DIAGNOSIS — N1831 Chronic kidney disease, stage 3a: Secondary | ICD-10-CM | POA: Diagnosis not present

## 2021-03-31 DIAGNOSIS — K922 Gastrointestinal hemorrhage, unspecified: Secondary | ICD-10-CM | POA: Diagnosis not present

## 2021-03-31 DIAGNOSIS — I130 Hypertensive heart and chronic kidney disease with heart failure and stage 1 through stage 4 chronic kidney disease, or unspecified chronic kidney disease: Secondary | ICD-10-CM | POA: Diagnosis not present

## 2021-03-31 DIAGNOSIS — N390 Urinary tract infection, site not specified: Secondary | ICD-10-CM | POA: Diagnosis not present

## 2021-03-31 LAB — HEMOGLOBIN: Hemoglobin: 11.5 g/dL — ABNORMAL LOW (ref 12.0–15.0)

## 2021-04-02 ENCOUNTER — Encounter: Payer: Self-pay | Admitting: Gastroenterology

## 2021-04-02 ENCOUNTER — Ambulatory Visit: Payer: Medicare Other | Admitting: Gastroenterology

## 2021-04-02 VITALS — BP 110/60 | HR 80 | Ht 61.0 in | Wt 144.0 lb

## 2021-04-02 DIAGNOSIS — D631 Anemia in chronic kidney disease: Secondary | ICD-10-CM | POA: Diagnosis not present

## 2021-04-02 DIAGNOSIS — Z8719 Personal history of other diseases of the digestive system: Secondary | ICD-10-CM | POA: Diagnosis not present

## 2021-04-02 DIAGNOSIS — E1122 Type 2 diabetes mellitus with diabetic chronic kidney disease: Secondary | ICD-10-CM | POA: Diagnosis not present

## 2021-04-02 DIAGNOSIS — N390 Urinary tract infection, site not specified: Secondary | ICD-10-CM | POA: Diagnosis not present

## 2021-04-02 DIAGNOSIS — M150 Primary generalized (osteo)arthritis: Secondary | ICD-10-CM | POA: Diagnosis not present

## 2021-04-02 DIAGNOSIS — M810 Age-related osteoporosis without current pathological fracture: Secondary | ICD-10-CM | POA: Diagnosis not present

## 2021-04-02 DIAGNOSIS — I4891 Unspecified atrial fibrillation: Secondary | ICD-10-CM | POA: Diagnosis not present

## 2021-04-02 DIAGNOSIS — K5730 Diverticulosis of large intestine without perforation or abscess without bleeding: Secondary | ICD-10-CM | POA: Diagnosis not present

## 2021-04-02 DIAGNOSIS — K5900 Constipation, unspecified: Secondary | ICD-10-CM | POA: Diagnosis not present

## 2021-04-02 DIAGNOSIS — I130 Hypertensive heart and chronic kidney disease with heart failure and stage 1 through stage 4 chronic kidney disease, or unspecified chronic kidney disease: Secondary | ICD-10-CM | POA: Diagnosis not present

## 2021-04-02 DIAGNOSIS — I2510 Atherosclerotic heart disease of native coronary artery without angina pectoris: Secondary | ICD-10-CM | POA: Diagnosis not present

## 2021-04-02 DIAGNOSIS — N1831 Chronic kidney disease, stage 3a: Secondary | ICD-10-CM | POA: Diagnosis not present

## 2021-04-02 DIAGNOSIS — Z7901 Long term (current) use of anticoagulants: Secondary | ICD-10-CM | POA: Diagnosis not present

## 2021-04-02 DIAGNOSIS — M519 Unspecified thoracic, thoracolumbar and lumbosacral intervertebral disc disorder: Secondary | ICD-10-CM | POA: Diagnosis not present

## 2021-04-02 DIAGNOSIS — Z9181 History of falling: Secondary | ICD-10-CM | POA: Diagnosis not present

## 2021-04-02 DIAGNOSIS — E785 Hyperlipidemia, unspecified: Secondary | ICD-10-CM | POA: Diagnosis not present

## 2021-04-02 DIAGNOSIS — D62 Acute posthemorrhagic anemia: Secondary | ICD-10-CM | POA: Diagnosis not present

## 2021-04-02 DIAGNOSIS — E1165 Type 2 diabetes mellitus with hyperglycemia: Secondary | ICD-10-CM | POA: Diagnosis not present

## 2021-04-02 DIAGNOSIS — I5032 Chronic diastolic (congestive) heart failure: Secondary | ICD-10-CM | POA: Diagnosis not present

## 2021-04-02 NOTE — Progress Notes (Signed)
Reviewed and agree with management plan.  Alexandro Line T. Obert Espindola, MD FACG [phone]  

## 2021-04-02 NOTE — Patient Instructions (Signed)
If you are age 85 or older, your body mass index should be between 23-30. Your Body mass index is 27.21 kg/m. If this is out of the aforementioned range listed, please consider follow up with your Primary Care Provider.  If you are age 61 or younger, your body mass index should be between 19-25. Your Body mass index is 27.21 kg/m. If this is out of the aformentioned range listed, please consider follow up with your Primary Care Provider.   Continues Miralax as dicussed.  Follow up as needed.   Thank you for choosing me and [facility].  Alonza Bogus, PA-C

## 2021-04-02 NOTE — Progress Notes (Signed)
04/02/2021 Briana [name] [id number] 1934/03/03   HISTORY OF PRESENT ILLNESS: This is a pleasant 87 year old female who is a patient of Dr. Lynne Leader.  She was seen by our service during recent hospitalization at the end of April for GI bleed.  Had flexible sigmoidoscopy 03/09/2021 with severe diverticulosis in sigmoid, descending colon and evidence of recent bleeding at a diverticulum with adherent clot but no active bleeding to which 2 hemostatic clips were placed.  She has resumed her Eliquis and has had no further sign of bleeding.  Is taking MiraLAX every other day because it is making her stools too mushy.  Most recent hemoglobin just 2 days ago was 11.5 g.  She has no complaints. Is here with her daughter.   Past Medical History:  Diagnosis Date  . Acute on chronic diastolic (congestive) heart failure ([place]) 11/21/2018  . Cancer ([place])    skin cancer  . DDD (degenerative disc disease)   . Diverticula of colon   . Diverticulitis   . DJD (degenerative joint disease)   . GERD (gastroesophageal reflux disease)    takes ranitidine  . Heart murmur   . History of hiatal hernia   . History of pneumonia   . Hx of irritable bowel syndrome   . Hyperlipidemia   . Hypertension   . Occasional tremors   . Osteoporosis   . Pre-diabetes   . Varicose veins   . Vitamin D deficiency    Past Surgical History:  Procedure Laterality Date  . APPENDECTOMY    . BREAST EXCISIONAL BIOPSY Right   . BREAST SURGERY Right    fluid drained from breast  . CARDIOVERSION N/A 12/20/2018   Procedure: CARDIOVERSION;  Surgeon: Buford Dresser, MD;  Location: [facility] ENDOSCOPY;  Service: Cardiovascular;  Laterality: N/A;  . CARDIOVERSION N/A 06/16/2019   Procedure: CARDIOVERSION;  Surgeon: Fay Records, MD;  Location: [place];  Service: Cardiovascular;  Laterality: N/A;  . COLONOSCOPY    . ESOPHAGOGASTRODUODENOSCOPY    . FLEXIBLE SIGMOIDOSCOPY N/A 03/09/2021   Procedure: FLEXIBLE SIGMOIDOSCOPY;  Surgeon:  Mauri Pole, MD;  Location: [place] ENDOSCOPY;  Service: Endoscopy;  Laterality: N/A;  . HEMOSTASIS CLIP PLACEMENT  03/09/2021   Procedure: HEMOSTASIS CLIP PLACEMENT;  Surgeon: Mauri Pole, MD;  Location: [place] ENDOSCOPY;  Service: Endoscopy;;  . JOINT REPLACEMENT Left    left hip  . LUMBAR LAMINECTOMY/DECOMPRESSION MICRODISCECTOMY N/A 06/05/2015   Procedure: L3-L5 DECOMPRESSION ;  Surgeon: Melina Schools, MD;  Location: [place];  Service: Orthopedics;  Laterality: N/A;  . OPEN REDUCTION INTERNAL FIXATION (ORIF) DISTAL RADIAL FRACTURE Right 01/10/2014   Procedure: OPEN REDUCTION INTERNAL FIXATION (ORIF) DISTAL RADIAL FRACTURE;  Surgeon: Linna Hoff, MD;  Location: [place];  Service: Orthopedics;  Laterality: Right;  . SPINAL CORD DECOMPRESSION  06/05/2015   L3 L 5  . THYROID SURGERY    . TONSILLECTOMY    . TUBAL LIGATION    . varicose veins stripped    . WRIST FRACTURE SURGERY Bilateral     reports that she has never smoked. She has never used smokeless tobacco. She reports that she does not drink alcohol and does not use drugs. family history includes Cancer in her brother; Heart disease in her sister; Hyperlipidemia in her mother; Hypertension in her mother. Allergies  Allergen Reactions  . Accupril [Quinapril Hcl] Cough  . Neosporin [Neomycin-Bacitracin Zn-Polymyx]     unknown  . Prednisone     Agitated and shaky  . Reglan [Metoclopramide] Other (  See Comments)    tremors  . Tramadol Nausea And Vomiting  . Adhesive [Tape] Rash      Outpatient Encounter Medications as of 04/02/2021  Medication Sig  . acetaminophen (TYLENOL) 500 MG tablet Take 500 mg by mouth every 6 (six) hours as needed for moderate pain.  Marland Kitchen amiodarone (PACERONE) 200 MG tablet Take 1 tablet by mouth once daily  . amLODipine (NORVASC) 2.5 MG tablet Take 1 tablet (2.5 mg total) by mouth daily.  Marland Kitchen apixaban (ELIQUIS) 2.5 MG TABS tablet Take 1 tablet (2.5 mg total) by mouth 2 (two) times daily.  .  Carboxymeth-Glycerin-Polysorb (REFRESH DIGITAL OP) Place 1 drop into the right eye daily as needed (dry eyes).  . cholecalciferol (VITAMIN D3) 25 MCG (1000 UT) tablet Take 1,000 Units by mouth 2 (two) times daily.  . Cyanocobalamin (B-12 SL) Place 1 tablet under the tongue daily.  . diazepam (VALIUM) 2 MG tablet Take  1 tablet 3 to 4  x /day  with Meals  for Tremor  . furosemide (LASIX) 80 MG tablet Take 1 tablet (80 mg total) by mouth 2 (two) times daily.  . Misc Natural Products (OSTEO BI-FLEX JOINT SHIELD PO) Take 1 tablet by mouth 2 (two) times daily.   . Multiple Vitamins-Minerals (PRESERVISION AREDS 2 PO) Take 1 capsule by mouth 2 (two) times daily.  . polyethylene glycol (MIRALAX / GLYCOLAX) 17 g packet Take 17 g by mouth daily.  . potassium chloride SA (KLOR-CON) 20 MEQ tablet Take 1 tablet (20 mEq total) by mouth 2 (two) times daily. (Patient taking differently: Take 20 mEq by mouth daily.)  . PROAIR HFA 108 (90 Base) MCG/ACT inhaler Inhale 2 puffs into the lungs every 6 (six) hours as needed for wheezing or shortness of breath.  . Pyridoxine HCl (VITAMIN B-6 PO) Take 1 capsule by mouth daily.  Marland Kitchen zinc gluconate 50 MG tablet Take 50 mg by mouth daily.  . [DISCONTINUED] Olopatadine HCl (PATADAY OP) Place 1 drop into the left eye daily.   No facility-administered encounter medications on file as of 04/02/2021.    REVIEW OF SYSTEMS  : All other systems reviewed and negative except where noted in the History of Present Illness.   PHYSICAL EXAM: BP 110/60   Pulse 80   Ht 5\' 1"  (1.549 m)   Wt 144 lb (65.3 kg)   BMI 27.21 kg/m  General: Well developed white female in no acute distress Head: Normocephalic and atraumatic Eyes:  sclerae anicteric,conjunctive pink. Ears: Normal auditory acuity Neck: Supple, no masses.  Lungs: Clear throughout to auscultation Heart: Regular rate and rhythm Abdomen: Soft, non-distended.  BS present.  Non-tender. Musculoskeletal: Symmetrical with no gross  deformities  Skin: No lesions on visible extremities Extremities: No edema  Neurological: Alert oriented x 4, grossly non-focal Psychological:  Alert and cooperative. Normal mood and affect  ASSESSMENT AND PLAN: *Diverticular bleed: Here for hospital follow-up.  Had flexible sigmoidoscopy 03/09/2021 with severe diverticulosis in sigmoid, descending colon and evidence of recent bleeding at a diverticulum with adherent clot but no active bleeding to which 2 hemostatic clips were placed.  She has resumed her Eliquis and has had no further sign of bleeding.  Is taking MiraLAX every other day because it is making her stools too mushy.  Most recent hemoglobin just 2 days ago was 11.5 g. -Continue MiraLAX with a half dose daily or full dose every other day to keep stools soft. -Blood counts can be rechecked/monitored by her PCP. -Follow-up here as  needed for any other issues.  Any recurrent bleeding similar to what she experienced this last episode would require another ER visit.  CC:  Unk Pinto, MD

## 2021-04-16 ENCOUNTER — Other Ambulatory Visit: Payer: Self-pay

## 2021-04-16 ENCOUNTER — Encounter: Payer: Self-pay | Admitting: Internal Medicine

## 2021-04-16 ENCOUNTER — Ambulatory Visit (INDEPENDENT_AMBULATORY_CARE_PROVIDER_SITE_OTHER): Payer: Medicare Other | Admitting: Internal Medicine

## 2021-04-16 VITALS — BP 120/68 | HR 62 | Temp 97.5°F | Resp 16 | Ht 61.0 in | Wt 143.0 lb

## 2021-04-16 DIAGNOSIS — H04123 Dry eye syndrome of bilateral lacrimal glands: Secondary | ICD-10-CM | POA: Diagnosis not present

## 2021-04-16 DIAGNOSIS — N184 Chronic kidney disease, stage 4 (severe): Secondary | ICD-10-CM | POA: Diagnosis not present

## 2021-04-16 DIAGNOSIS — I10 Essential (primary) hypertension: Secondary | ICD-10-CM | POA: Diagnosis not present

## 2021-04-16 DIAGNOSIS — H40011 Open angle with borderline findings, low risk, right eye: Secondary | ICD-10-CM | POA: Diagnosis not present

## 2021-04-16 DIAGNOSIS — K5733 Diverticulitis of large intestine without perforation or abscess with bleeding: Secondary | ICD-10-CM | POA: Diagnosis not present

## 2021-04-16 DIAGNOSIS — H401122 Primary open-angle glaucoma, left eye, moderate stage: Secondary | ICD-10-CM | POA: Diagnosis not present

## 2021-04-16 DIAGNOSIS — E119 Type 2 diabetes mellitus without complications: Secondary | ICD-10-CM | POA: Diagnosis not present

## 2021-04-16 DIAGNOSIS — H0102B Squamous blepharitis left eye, upper and lower eyelids: Secondary | ICD-10-CM | POA: Diagnosis not present

## 2021-04-16 DIAGNOSIS — H43811 Vitreous degeneration, right eye: Secondary | ICD-10-CM | POA: Diagnosis not present

## 2021-04-16 DIAGNOSIS — H25813 Combined forms of age-related cataract, bilateral: Secondary | ICD-10-CM | POA: Diagnosis not present

## 2021-04-16 DIAGNOSIS — E1122 Type 2 diabetes mellitus with diabetic chronic kidney disease: Secondary | ICD-10-CM | POA: Diagnosis not present

## 2021-04-16 DIAGNOSIS — H353131 Nonexudative age-related macular degeneration, bilateral, early dry stage: Secondary | ICD-10-CM | POA: Diagnosis not present

## 2021-04-16 DIAGNOSIS — H0102A Squamous blepharitis right eye, upper and lower eyelids: Secondary | ICD-10-CM | POA: Diagnosis not present

## 2021-04-16 DIAGNOSIS — H1045 Other chronic allergic conjunctivitis: Secondary | ICD-10-CM | POA: Diagnosis not present

## 2021-04-16 NOTE — Progress Notes (Signed)
Future Appointments  Date Time Provider [place]  04/16/2021  4:00 PM Unk Pinto, MD [facility] None  08/13/2021 - [facility] Wellness  4:00 PM [name], NP [facility] None  03/09/2022  -  CPE  3:00 PM Unk Pinto, MD [facility] None    History of Present Illness:      1 month ago, patient  was seen in f/u post hospital for rectal  hemorrhage on Eliquis. Returns w/o  Recurrent rectal bleeding & also seen in the interim by GI. Daughter has concerns re: familial tremor and would like to consider restart Nadolol 20 mg x 1/2 tab /day .   Medications  .  amiodarone  200 MG tablet, Take 1 tablet  once daily .  amLODipine  2.5 MG tablet, Take 1 tablet  daily. .  furosemide  80 MG tablet, Take 1 tablet  2  times daily. Marland Kitchen  PROAIR HFA  inhaler, Inhale 2 puffs every 6  hours as needed for wheezing  .  acetaminophen 500 MG tablet, Take  every  hours as needed for moderate pain.  Marland Kitchen  apixaban (ELIQUIS) 2.5 MG TABS , Take 1 tablet 2 (two) times daily.  .  B-12 SL, Place 1 tablet under the tongue daily. Marland Kitchen  REFRESH DIGITAL , Place 1 drop into the right eye daily as needed (dry eyes). Marland Kitchen  VITAMIN D 1000 u , Take  2 times daily. .  diazepam  2 MG tablet, Take  1 tablet 3 to 4  x /day  with Meals  for Tremor .  OSTEO BI-FLEX JOINT SHIELD , Take 1 tablet 2  times daily.  Marland Kitchen  PRESERVISION AREDS 2 , Take 1 capsule 2  times daily. .  polyethylene glycol 17 g  packet, Take daily. .  potassium chloride 20 MEQ tablet,  Take 20 mEq daily.) .  Pyridoxine /VITAMIN B-6 , Take 1 capsule daily. Marland Kitchen  zinc  50 MG tablet, Take  daily.  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 ; Hyperparathyroidism due to renal insufficiency (HCC) (Elev PTH 140 on 03/05/2021 ; and Diverticular hemorrhage on their problem list.   Observations/Objective:  BP 120/68 (BP Location: Right Arm, Patient Position: Sitting, Cuff Size: Normal)   Pulse 62   Temp (!) 97.5 F (36.4 C)   Resp 16   Ht 5\' 1"  (1.549 m)   Wt 143 lb (64.9 kg)   SpO2 96%   BMI 27.02 kg/m   HEENT - WNL. Neck - supple.  Chest - Clear equal BS. Cor - Nl HS. RRR w/o sig MGR. PP 1(+). No edema. MS- FROM w/o deformities.  Gait Nl. Neuro -  Nl w/o focal abnormalities. Sl tremor of hands.    Assessment and Plan:      Follow Up Instructions:        I discussed the assessment and treatment plan with the patient. The patient was provided an opportunity to ask questions and all were answered. The patient agreed with the plan and demonstrated an understanding of the instructions.       The patient was advised to call back or seek an in-person evaluation  if the symptoms worsen or if the condition fails to improve as anticipated.    Kirtland Bouchard, MD

## 2021-04-17 LAB — CBC WITH DIFFERENTIAL/PLATELET
Absolute Monocytes: 838 cells/uL (ref 200–950)
Basophils Absolute: 28 cells/uL (ref 0–200)
Basophils Relative: 0.4 %
Eosinophils Absolute: 99 cells/uL (ref 15–500)
Eosinophils Relative: 1.4 %
HCT: 36.1 % (ref 35.0–45.0)
Hemoglobin: 11.4 g/dL — ABNORMAL LOW (ref 11.7–15.5)
Lymphs Abs: 1292 cells/uL (ref 850–3900)
MCH: 28.7 pg (ref 27.0–33.0)
MCHC: 31.6 g/dL — ABNORMAL LOW (ref 32.0–36.0)
MCV: 90.9 fL (ref 80.0–100.0)
MPV: 11.6 fL (ref 7.5–12.5)
Monocytes Relative: 11.8 %
Neutro Abs: 4842 cells/uL (ref 1500–7800)
Neutrophils Relative %: 68.2 %
Platelets: 235 10*3/uL (ref 140–400)
RBC: 3.97 10*6/uL (ref 3.80–5.10)
RDW: 13.3 % (ref 11.0–15.0)
Total Lymphocyte: 18.2 %
WBC: 7.1 10*3/uL (ref 3.8–10.8)

## 2021-04-17 LAB — COMPLETE METABOLIC PANEL WITH GFR
AG Ratio: 2.2 (calc) (ref 1.0–2.5)
ALT: 17 U/L (ref 6–29)
AST: 26 U/L (ref 10–35)
Albumin: 4.3 g/dL (ref 3.6–5.1)
Alkaline phosphatase (APISO): 110 U/L (ref 37–153)
BUN/Creatinine Ratio: 13 (calc) (ref 6–22)
BUN: 22 mg/dL (ref 7–25)
CO2: 29 mmol/L (ref 20–32)
Calcium: 9.8 mg/dL (ref 8.6–10.4)
Chloride: 103 mmol/L (ref 98–110)
Creat: 1.65 mg/dL — ABNORMAL HIGH (ref 0.60–0.88)
GFR, Est African American: 32 mL/min/{1.73_m2} — ABNORMAL LOW (ref >=60)
GFR, Est Non African American: 28 mL/min/{1.73_m2} — ABNORMAL LOW (ref >=60)
Globulin: 2 g/dL (calc) (ref 1.9–3.7)
Glucose, Bld: 144 mg/dL — ABNORMAL HIGH (ref 65–99)
Potassium: 4.5 mmol/L (ref 3.5–5.3)
Sodium: 140 mmol/L (ref 135–146)
Total Bilirubin: 0.6 mg/dL (ref 0.2–1.2)
Total Protein: 6.3 g/dL (ref 6.1–8.1)

## 2021-04-18 NOTE — Progress Notes (Signed)
============================================================ ============================================================  -    Hgb / Red Cell Count = 11.4 gm% - Appears remaining up higher & Stable  ============================================================ ============================================================  - Kidney Functions  (GFR 28) - Stage 4 Kidney Disease                                                                     - appears stable over the last month   ============================================================ ============================================================  - Suggest  - schedule an office visit in 1 month to recheck &                                                    closely monitor blood counts & kidney functions ============================================================ ============================================================

## 2021-04-18 NOTE — Progress Notes (Signed)
Pt was called to discuss her lab results. Pt stated she understood and will schedule her 1 month f/u. DG/CCMA

## 2021-04-24 DIAGNOSIS — H401122 Primary open-angle glaucoma, left eye, moderate stage: Secondary | ICD-10-CM | POA: Diagnosis not present

## 2021-04-24 DIAGNOSIS — H2512 Age-related nuclear cataract, left eye: Secondary | ICD-10-CM | POA: Diagnosis not present

## 2021-04-28 DIAGNOSIS — E559 Vitamin D deficiency, unspecified: Secondary | ICD-10-CM | POA: Diagnosis not present

## 2021-04-28 DIAGNOSIS — N179 Acute kidney failure, unspecified: Secondary | ICD-10-CM | POA: Diagnosis not present

## 2021-05-05 DIAGNOSIS — N1832 Chronic kidney disease, stage 3b: Secondary | ICD-10-CM | POA: Diagnosis not present

## 2021-05-05 DIAGNOSIS — E876 Hypokalemia: Secondary | ICD-10-CM | POA: Diagnosis not present

## 2021-05-05 DIAGNOSIS — I480 Paroxysmal atrial fibrillation: Secondary | ICD-10-CM | POA: Diagnosis not present

## 2021-05-05 DIAGNOSIS — E1122 Type 2 diabetes mellitus with diabetic chronic kidney disease: Secondary | ICD-10-CM | POA: Diagnosis not present

## 2021-05-05 DIAGNOSIS — N2581 Secondary hyperparathyroidism of renal origin: Secondary | ICD-10-CM | POA: Diagnosis not present

## 2021-05-05 DIAGNOSIS — I129 Hypertensive chronic kidney disease with stage 1 through stage 4 chronic kidney disease, or unspecified chronic kidney disease: Secondary | ICD-10-CM | POA: Diagnosis not present

## 2021-05-05 DIAGNOSIS — I5032 Chronic diastolic (congestive) heart failure: Secondary | ICD-10-CM | POA: Diagnosis not present

## 2021-05-05 DIAGNOSIS — R350 Frequency of micturition: Secondary | ICD-10-CM | POA: Diagnosis not present

## 2021-05-24 ENCOUNTER — Other Ambulatory Visit: Payer: Self-pay | Admitting: Internal Medicine

## 2021-05-24 MED ORDER — AZITHROMYCIN 250 MG PO TABS: ORAL_TABLET | ORAL | 1 refills | Status: DC

## 2021-05-26 ENCOUNTER — Ambulatory Visit (INDEPENDENT_AMBULATORY_CARE_PROVIDER_SITE_OTHER): Payer: Medicare Other | Admitting: Internal Medicine

## 2021-05-26 ENCOUNTER — Encounter: Payer: Self-pay | Admitting: Internal Medicine

## 2021-05-26 ENCOUNTER — Other Ambulatory Visit: Payer: Self-pay

## 2021-05-26 VITALS — BP 93/66 | HR 61 | Temp 97.7°F | Resp 16 | Ht 61.0 in | Wt 138.3 lb

## 2021-05-26 DIAGNOSIS — K5733 Diverticulitis of large intestine without perforation or abscess with bleeding: Secondary | ICD-10-CM

## 2021-05-26 DIAGNOSIS — N184 Chronic kidney disease, stage 4 (severe): Secondary | ICD-10-CM | POA: Diagnosis not present

## 2021-05-26 DIAGNOSIS — U071 COVID-19: Secondary | ICD-10-CM | POA: Diagnosis not present

## 2021-05-26 DIAGNOSIS — R300 Dysuria: Secondary | ICD-10-CM

## 2021-05-26 DIAGNOSIS — D500 Iron deficiency anemia secondary to blood loss (chronic): Secondary | ICD-10-CM | POA: Diagnosis not present

## 2021-05-26 DIAGNOSIS — E1122 Type 2 diabetes mellitus with diabetic chronic kidney disease: Secondary | ICD-10-CM | POA: Diagnosis not present

## 2021-05-26 DIAGNOSIS — Z79899 Other long term (current) drug therapy: Secondary | ICD-10-CM

## 2021-05-26 DIAGNOSIS — I482 Chronic atrial fibrillation, unspecified: Secondary | ICD-10-CM | POA: Diagnosis not present

## 2021-05-26 DIAGNOSIS — I951 Orthostatic hypotension: Secondary | ICD-10-CM

## 2021-05-26 DIAGNOSIS — I10 Essential (primary) hypertension: Secondary | ICD-10-CM | POA: Diagnosis not present

## 2021-05-26 NOTE — Progress Notes (Signed)
Future Appointments  Date Time Provider [place]  05/26/2021  4:00 PM Unk Pinto, MD [facility] None  08/13/2021 -Wellness  4:00 PM [name], NP [facility] None  03/09/2022  3:00 PM Unk Pinto, MD [facility] None    History of Present Illness:  Patient returns for f/u of anemia after Diverticular bleeding last Hbg 11.4 and CKD4  (GFR 28) . She also has developed chest congestion /cough and daughter requested test for Covid which was (+) positive.    Medications   Current Outpatient Medications (Cardiovascular):    amiodarone (PACERONE) 200 MG tablet, Take 1 tablet by mouth once daily   amLODipine (NORVASC) 2.5 MG tablet, Take 1 tablet (2.5 mg total) by mouth daily.   furosemide (LASIX) 80 MG tablet, Take 1 tablet (80 mg total) by mouth 2 (two) times daily.  Current Outpatient Medications (Respiratory):    PROAIR HFA 108 (90 Base) MCG/ACT inhaler, Inhale 2 puffs into the lungs every 6 (six) hours as needed for wheezing or shortness of breath.  Current Outpatient Medications (Analgesics):    acetaminophen (TYLENOL) 500 MG tablet, Take 500 mg by mouth every 6 (six) hours as needed for moderate pain.  Current Outpatient Medications (Hematological):    apixaban (ELIQUIS) 2.5 MG TABS tablet, Take 1 tablet (2.5 mg total) by mouth 2 (two) times daily.   Cyanocobalamin (B-12 SL), Place 1 tablet under the tongue daily.  Current Outpatient Medications (Other):    azithromycin (ZITHROMAX) 250 MG tablet, Take 2 tablets with Food on  Day 1, then 1 tablet Daily with Food for Infection   Carboxymeth-Glycerin-Polysorb (REFRESH DIGITAL OP), Place 1 drop into the right eye daily as needed (dry eyes).   cholecalciferol (VITAMIN D3) 25 MCG (1000 UT) tablet, Take 1,000 Units by mouth 2 (two) times daily.   diazepam (VALIUM) 2 MG tablet, Take  1 tablet 3 to 4  x /day  with Meals  for Tremor   Misc Natural Products (OSTEO BI-FLEX JOINT SHIELD PO), Take 1 tablet by mouth 2  (two) times daily.   Multiple Vitamins-Minerals (PRESERVISION AREDS 2 PO), Take 1 capsule by mouth 2 (two) times daily.   polyethylene glycol (MIRALAX / GLYCOLAX) 17 g packet, Take 17 g by mouth daily.   potassium chloride SA (KLOR-CON) 20 MEQ tablet, Take 1 tablet (20 mEq total) by mouth 2 (two) times daily. (Patient taking differently: Take 20 mEq by mouth daily.)   Pyridoxine HCl (VITAMIN B-6 PO), Take 1 capsule by mouth daily.   zinc gluconate 50 MG tablet, Take 50 mg by mouth daily.  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 ; Hyperparathyroidism due to renal insufficiency (HCC) (Elev PTH 140 on 03/05/2021 ; and Diverticular hemorrhage on their problem list.   Observations/Objective:  BP 93/66   Pulse 61   Temp 97.7 F (36.5 C)   Resp 16   Ht 5\' 1"  (1.549 m)   Wt 138 lb 4.8 oz (62.7 kg)   SpO2 91%   BMI 26.13 kg/m   Postural sitting BP 124/73   P 58          &  Standing    BP 73/42      P 82  HEENT - WNL. Neck - supple.  Chest - Clear equal BS. Cor - Nl HS. RRR w/o sig MGR. PP 1(+). No edema. MS- FROM w/o deformities.  Gait Nl. Neuro -  Nl w/o focal abnormalities.   Assessment and Plan:  1. Essential hypertension  - CBC with Differential/Platelet - COMPLETE METABOLIC PANEL WITH GFR  2. Postural hypotension   3. Type 2 diabetes mellitus with stage 4 chronic kidney  disease, without long-term current use of insulin (HCC)  - COMPLETE METABOLIC PANEL WITH GFR  4. Diverticulitis of colon with bleeding  - CBC with  Differential/Platelet  5. Iron deficiency anemia due to chronic blood loss  - CBC with Differential/Platelet - Iron, Total/Total Iron Binding Cap  6. Chronic atrial fibrillation (HCC)   7. Medication management  - Advised D/C Amlodipine  and monitor standing BP's bid and report back .  - Strongly encouraged increase fluid intake and discussed precautions re:  fall risk.   8. Dysuria  - Urinalysis, Routine w reflex microscopic - Urine Culture   Follow Up Instructions:        I discussed the assessment and treatment plan with the patient. The patient was provided an opportunity to ask questions and all were answered. The patient agreed with the plan and demonstrated an understanding of the instructions.       The patient was advised to call back or seek an in-person evaluation if the symptoms worsen or if the condition fails to improve as anticipated.    Kirtland Bouchard, MD

## 2021-05-27 LAB — CBC WITH DIFFERENTIAL/PLATELET
Absolute Monocytes: 618 cells/uL (ref 200–950)
Basophils Absolute: 9 cells/uL (ref 0–200)
Basophils Relative: 0.3 %
Eosinophils Absolute: 9 cells/uL — ABNORMAL LOW (ref 15–500)
Eosinophils Relative: 0.3 %
HCT: 39.4 % (ref 35.0–45.0)
Hemoglobin: 12.9 g/dL (ref 11.7–15.5)
Lymphs Abs: 897 cells/uL (ref 850–3900)
MCH: 29.1 pg (ref 27.0–33.0)
MCHC: 32.7 g/dL (ref 32.0–36.0)
MCV: 88.9 fL (ref 80.0–100.0)
MPV: 11.8 fL (ref 7.5–12.5)
Monocytes Relative: 20.6 %
Neutro Abs: 1467 cells/uL — ABNORMAL LOW (ref 1500–7800)
Neutrophils Relative %: 48.9 %
Platelets: 171 10*3/uL (ref 140–400)
RBC: 4.43 10*6/uL (ref 3.80–5.10)
RDW: 12.4 % (ref 11.0–15.0)
Total Lymphocyte: 29.9 %
WBC: 3 10*3/uL — ABNORMAL LOW (ref 3.8–10.8)

## 2021-05-27 LAB — URINE CULTURE
MICRO NUMBER:: 12104282
Result:: NO GROWTH
SPECIMEN QUALITY:: ADEQUATE

## 2021-05-27 LAB — URINALYSIS, ROUTINE W REFLEX MICROSCOPIC
Bilirubin Urine: NEGATIVE
Glucose, UA: NEGATIVE
Hgb urine dipstick: NEGATIVE
Ketones, ur: NEGATIVE
Leukocytes,Ua: NEGATIVE
Nitrite: NEGATIVE
Protein, ur: NEGATIVE
Specific Gravity, Urine: 1.011 (ref 1.001–1.035)
pH: 5.5 (ref 5.0–8.0)

## 2021-05-27 LAB — IRON, TOTAL/TOTAL IRON BINDING CAP
%SAT: 7 % (calc) — ABNORMAL LOW (ref 16–45)
Iron: 27 ug/dL — ABNORMAL LOW (ref 45–160)
TIBC: 361 mcg/dL (calc) (ref 250–450)

## 2021-05-27 LAB — COMPLETE METABOLIC PANEL WITH GFR
AG Ratio: 1.8 (calc) (ref 1.0–2.5)
ALT: 72 U/L — ABNORMAL HIGH (ref 6–29)
AST: 71 U/L — ABNORMAL HIGH (ref 10–35)
Albumin: 4.3 g/dL (ref 3.6–5.1)
Alkaline phosphatase (APISO): 115 U/L (ref 37–153)
BUN/Creatinine Ratio: 15 (calc) (ref 6–22)
BUN: 29 mg/dL — ABNORMAL HIGH (ref 7–25)
CO2: 29 mmol/L (ref 20–32)
Calcium: 9.6 mg/dL (ref 8.6–10.4)
Chloride: 99 mmol/L (ref 98–110)
Creat: 1.98 mg/dL — ABNORMAL HIGH (ref 0.60–0.95)
Globulin: 2.4 g/dL (calc) (ref 1.9–3.7)
Glucose, Bld: 131 mg/dL — ABNORMAL HIGH (ref 65–99)
Potassium: 4.4 mmol/L (ref 3.5–5.3)
Sodium: 137 mmol/L (ref 135–146)
Total Bilirubin: 0.4 mg/dL (ref 0.2–1.2)
Total Protein: 6.7 g/dL (ref 6.1–8.1)
eGFR: 24 mL/min/{1.73_m2} — ABNORMAL LOW (ref >=60)

## 2021-05-27 LAB — POC COVID19 BINAXNOW: SARS Coronavirus 2 Ag: POSITIVE — AB

## 2021-05-27 NOTE — Progress Notes (Signed)
============================================================ ============================================================  -    Urine culture - OK - No Infection  ============================================================ ============================================================  - Blood tests still pending  ============================================================ ===========================================================

## 2021-05-30 NOTE — Progress Notes (Signed)
PATIENT IS AWARE OF LAB RESULTS AND INSTRUCTIONS. -E WELCH

## 2021-07-14 DIAGNOSIS — H2511 Age-related nuclear cataract, right eye: Secondary | ICD-10-CM | POA: Diagnosis not present

## 2021-07-17 DIAGNOSIS — H2511 Age-related nuclear cataract, right eye: Secondary | ICD-10-CM | POA: Diagnosis not present

## 2021-07-23 ENCOUNTER — Other Ambulatory Visit: Payer: Self-pay | Admitting: Internal Medicine

## 2021-07-23 MED ORDER — AMIODARONE HCL 200 MG PO TABS: 200.0000 mg | ORAL_TABLET | Freq: Every day | ORAL | 3 refills | Status: DC

## 2021-07-23 MED ORDER — DILTIAZEM HCL ER COATED BEADS 180 MG PO CP24: 180.0000 mg | ORAL_CAPSULE | Freq: Every day | ORAL | 1 refills | Status: DC

## 2021-08-07 ENCOUNTER — Telehealth: Payer: Self-pay | Admitting: Internal Medicine

## 2021-08-07 NOTE — Chronic Care Management (AMB) (Signed)
  Chronic Care Management   Note  08/07/2021 Name: AFOMIA BLACKLEY MRN: [id number] DOB: 02/10/1934  Gennett LACONDA BASICH is a 88 y.o. year old female who is a primary care patient of Unk Pinto, MD. I reached out to [name] by phone today in response to a referral sent by Ms. Maressa K Hellmann's PCP, Unk Pinto, MD.   Ms. Lawlor was given information about Chronic Care Management services today including:  CCM service includes personalized support from designated clinical staff supervised by her physician, including individualized plan of care and coordination with other care providers 24/7 contact phone numbers for assistance for urgent and routine care needs. Service will only be billed when office clinical staff spend 20 minutes or more in a month to coordinate care. Only one practitioner may furnish and bill the service in a calendar month. The patient may stop CCM services at any time (effective at the end of the month) by phone call to the office staff.   Patient agreed to services and verbal consent obtained.   Follow up plan:   [place] Secretary/administrator

## 2021-08-12 NOTE — Progress Notes (Signed)
MEDICARE ANNUAL WELLNESS VISIT AND FOLLOW UP  Assessment:   Diagnoses and all orders for this visit:  Encounter for Medicare annual wellness exam  Due Annually  Health Maintenance Reviewed  Healthy lifestyle reviewed and goals set   A. Fib No concerns with excessive bleeding, no falls Continue medication: elequis Followed by cardiology Rate controlled today  CHF Weights stable, appears euvolemic Emphasized salt restriction, less than 2000mg  a day. Encouraged daily monitoring of the patient's weight, call office if 5 lb weight loss or gain in a day.  Encouraged regular exercise. If any increasing shortness of breath, swelling, or chest pressure go to ER immediately.  decrease your fluid intake to less than 2 L daily please remember to always increase your potassium intake with any increase of your fluid pill.    Essential hypertension Continue medication: propanolol Monitor blood pressure at home; call if consistently over 130/80 Continue DASH diet.   Reminder to go to the ER if any CP, SOB, nausea, dizziness, severe HA, changes vision/speech, left arm numbness and tingling and jaw pain.  GERD Well managed on current medications Discussed diet, avoiding triggers and other lifestyle changes  Age-related osteoporosis without current pathological fracture Will refer back for DEXA today Continue exercises, calcium and vitamin D   Vitamin D deficiency At goal at recent check; continue to recommend supplementation for goal of 70-100 Defer vitamin D level  Prediabetes Discussed disease and risks Discussed diet/exercise, weight management   Medication management CBC, CMP/GFR, magnesium   Spinal stenosis of lumbar region without neurogenic claudication Denies any back pain or concerning symptoms  Hyperlipidemia Currently treated by lifestyle with borderline control Continue low cholesterol diet and exercise.  Check lipid panel.  TSH  Fibrocystic breast disease  (FCBD), unspecified laterality Continue annual mammogram screening  CKD (chronic kidney disease) stage 3, GFR 30-59 ml/min (HCC) Increase fluids, avoid NSAIDS, monitor sugars, will monitor CMP/GFR Microalbumin/creatinine urine ratio  Over 40 minutes of exam, counseling, chart review and critical decision making was performed Future Appointments  Date Time Provider [place]  09/23/2021 11:45 AM Deberah Pelton, NP [facility]  10/07/2021  1:30 PM Rush Landmark, RPH [facility] None  03/09/2022  3:00 PM Unk Pinto, MD [facility] None  08/13/2022  4:00 PM Magda Bernheim, NP [facility] None     Plan:   During the course of the visit the patient was educated and counseled about appropriate screening and preventive services including:   Pneumococcal vaccine  Prevnar 13 Influenza vaccine Td vaccine Screening electrocardiogram Bone densitometry screening Colorectal cancer screening Diabetes screening Glaucoma screening Nutrition counseling  Advanced directives: requested   Subjective:  Briana [name] is a 87 y.o. female who presents for Medicare Annual Wellness Visit and 3 month follow up.    Patient's GERD is controlled with her meds.   Patient is also followed by Dr Nelva Bush with [facility] for Lumbar spinal stenosis and had L3/L5 SS decompression surg in July 2016 by Dr Apolonio Schneiders. No further back pain.   She also has hereditary or essential tremors. Takes Valium for tremors as needed.   She has atrial fibrillation, cardioversion 12/2018 successful and now on amiodarone and elequis, cardizem 180 mg . Followed by Dr. Claiborne Billings.   BMI is Body mass index is 26.45 kg/m., she has been working on diet (avoiding fried foods) and exercise. Wt Readings from Last 3 Encounters:  08/13/21 140 lb (63.5 kg)  05/26/21 138 lb 4.8 oz (62.7 kg)  04/16/21 143 lb (64.9 kg)  Her blood pressure has been controlled at home, today their BP is BP: (!) 102/50  BP Readings from Last 3  Encounters:  08/13/21 (!) 102/50  05/26/21 93/66  04/16/21 120/68    She does workout. She denies chest pain, shortness of breath, dizziness.    She is not on cholesterol medication and denies myalgias. Her cholesterol is not at goal. The cholesterol last visit was:   Lab Results  Component Value Date   CHOL 206 (H) 03/05/2021   HDL 58 03/05/2021   LDLCALC 125 (H) 03/05/2021   TRIG 115 03/05/2021   CHOLHDL 3.6 03/05/2021    She has been working on diet and exercise for prediabetes, and denies foot ulcerations, increased appetite, nausea, paresthesia of the feet, polydipsia, polyuria, visual disturbances, vomiting and weight loss. Last A1C in the office was:  Lab Results  Component Value Date   HGBA1C 7.1 (H) 03/05/2021   Last GFR: Lab Results  Component Value Date   GFRNONAA 28 (L) 04/16/2021   Patient is on Vitamin D supplement and at goal at recent check:    Lab Results  Component Value Date   VD25OH 102 (H) 03/05/2021      Right eye catarract surgery 4 weeks ago.  Medication Review: Current Outpatient Medications on File Prior to Visit  Medication Sig Dispense Refill   acetaminophen (TYLENOL) 500 MG tablet Take 500 mg by mouth every 6 (six) hours as needed for moderate pain.     amiodarone (PACERONE) 200 MG tablet Take 1 tablet (200 mg total) by mouth daily. 90 tablet 3   apixaban (ELIQUIS) 2.5 MG TABS tablet Take 1 tablet (2.5 mg total) by mouth 2 (two) times daily. 60 tablet 6   Carboxymeth-Glycerin-Polysorb (REFRESH DIGITAL OP) Place 1 drop into the right eye daily as needed (dry eyes).     Cyanocobalamin (B-12 SL) Place 1 tablet under the tongue daily.     diazepam (VALIUM) 2 MG tablet Take  1 tablet 3 to 4  x /day  with Meals  for Tremor 120 tablet 0   diltiazem (CARDIZEM CD) 180 MG 24 hr capsule Take 1 capsule (180 mg total) by mouth daily. 30 capsule 1   furosemide (LASIX) 80 MG tablet Take 1 tablet (80 mg total) by mouth 2 (two) times daily. 120 tablet 2    polyethylene glycol (MIRALAX / GLYCOLAX) 17 g packet Take 17 g by mouth daily. 14 each 0   potassium chloride SA (KLOR-CON) 20 MEQ tablet Take 1 tablet (20 mEq total) by mouth 2 (two) times daily. (Patient taking differently: Take 20 mEq by mouth daily.) 60 tablet 0   PROAIR HFA 108 (90 Base) MCG/ACT inhaler Inhale 2 puffs into the lungs every 6 (six) hours as needed for wheezing or shortness of breath.     Pyridoxine HCl (VITAMIN B-6 PO) Take 1 capsule by mouth daily.     vitamin C (ASCORBIC ACID) 500 MG tablet Take 500 mg by mouth daily.     zinc gluconate 50 MG tablet Take 50 mg by mouth daily.     No current facility-administered medications on file prior to visit.    Allergies  Allergen Reactions   Accupril [Quinapril Hcl] Cough   Neosporin [Neomycin-Bacitracin Zn-Polymyx]     unknown   Prednisone     Agitated and shaky   Reglan [Metoclopramide] Other (See Comments)    tremors   Tramadol Nausea And Vomiting   Adhesive [Tape] Rash    Current Problems (verified) Patient  Active Problem List   Diagnosis Date Noted   Diverticular hemorrhage    Hyperparathyroidism due to renal insufficiency ([place]) (Elev PTH 140 on 03/05/2021  03/06/2021   Aortic atherosclerosis ([place]) by Chest CTA on 11/21/2018.  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 07/01/2020   Acquired thrombophilia ([place]) 11/15/2019   Chronic diastolic heart failure ([place]) 04/03/2019   CKD stage 3 due to type 2 diabetes mellitus ([place]) 02/23/2018   Bilateral sensorineural hearing loss 07/02/2017   Obesity (BMI 30.0-34.9) 09/05/2015   Lumbar stenosis 06/05/2015   GERD 11/20/2013   Osteopenia 11/20/2013   Essential hypertension 11/19/2013   Type 2 diabetes mellitus with hyperlipidemia ([place]) 11/19/2013   Vitamin D deficiency 11/19/2013   Diffuse cystic mastopathy 11/19/2013    Screening Tests Immunization History  Administered Date(s) Administered   DT (Pediatric) 09/05/2015   DTaP  11/16/2004   Influenza Whole 08/30/2013   Influenza, High Dose Seasonal PF 08/28/2015, 08/10/2016, 11/20/2019, 09/10/2020   Pneumococcal Conjugate-13 10/19/2016   Pneumococcal Polysaccharide-23 11/16/2001   Zoster, Live 06/01/2013   Preventative care: Last colonoscopy: 2011, Flex Sig 02/2021 Mammogram: 03/03/21 negative repeat 1 year DEXA: 02/2021- T - 2.2  femur  Prior vaccinations: TD or Tdap: 2016  Influenza: 2017, declines   Pneumococcal: 2003 Prevnar13: 2017 Shingles/Zostavax: 2014  Names of Other Physician/Practitioners you currently use: 1. [facility] here for primary care 2. Dr. Katy Fitch, eye doctor, last visit 2022 3. Dr. Buelah Manis, dentist, last visit 2022  Patient Care Team: Unk Pinto, MD as PCP - General (Internal Medicine) Troy Sine, MD as PCP - Cardiology (Cardiology) Iran Planas, MD as Consulting Physician (Orthopedic Surgery) Ladene Artist, MD as Consulting Physician (Gastroenterology) Suella Broad, MD as Consulting Physician (Physical Medicine and Rehabilitation) Rolm Bookbinder, MD as Consulting Physician (Dermatology) Buford Dresser, MD as Consulting Physician (Cardiology) Rush Landmark, [facility] as Pharmacist (Pharmacist)  SURGICAL HISTORY She  has a past surgical history that includes Thyroid surgery; varicose veins stripped; Tonsillectomy; Appendectomy; Tubal ligation; Open reduction internal fixation (orif) distal radial fracture (Right, 01/10/2014); Colonoscopy; Esophagogastroduodenoscopy; Breast surgery (Right); Joint replacement (Left); Spinal cord decompression (06/05/2015); Lumbar laminectomy/decompression microdiscectomy (N/A, 06/05/2015); Cardioversion (N/A, 12/20/2018); Cardioversion (N/A, 06/16/2019); Breast excisional biopsy (Right); Flexible sigmoidoscopy (N/A, 03/09/2021); Hemostasis clip placement (03/09/2021); and Wrist fracture surgery (Bilateral). FAMILY HISTORY Her family history includes  Cancer in her brother; Heart disease in her sister; Hyperlipidemia in her mother; Hypertension in her mother. SOCIAL HISTORY She  reports that she has never smoked. She has never used smokeless tobacco. She reports that she does not drink alcohol and does not use drugs.   MEDICARE WELLNESS OBJECTIVES: Physical activity: Current Exercise Habits: Home exercise routine, Type of exercise: walking, Time (Minutes): 30, Frequency (Times/Week): 2, Weekly Exercise (Minutes/Week): 60, Intensity: Mild Cardiac risk factors: Cardiac Risk Factors include: advanced age (>39men, >75 women);diabetes mellitus;hypertension Depression/mood screen:   Depression screen [facility] 2/9 08/13/2021  Decreased Interest 0  Down, Depressed, Hopeless 0  PHQ - 2 Score 0  Some recent data might be hidden    ADLs:  In your present state of health, do you have any difficulty performing the following activities: 08/13/2021 04/16/2021  Hearing? N N  Comment - -  Vision? N N  Difficulty concentrating or making decisions? N N  Walking or climbing stairs? N N  Dressing or bathing? N N  Doing errands, shopping? N N  Some recent data might be hidden     Cognitive Testing  Alert? Yes  Normal Appearance?Yes  Oriented to person? Yes  Place? Yes   Time? Yes  Recall of three objects?  Yes  Can perform simple calculations? Yes  Displays appropriate judgment?Yes  Can read the correct time from a watch face?Yes  EOL planning: Does Patient Have a Medical Advance Directive?: No Would patient like information on creating a medical advance directive?: Yes (MAU/Ambulatory/Procedural Areas - Information given)  Review of Systems  Constitutional:  Negative for chills, fever, malaise/fatigue and weight loss.  HENT:  Negative for congestion, hearing loss, sore throat and tinnitus.   Eyes:  Negative for blurred vision and double vision.  Respiratory:  Negative for cough, sputum production, shortness of breath and wheezing.   Cardiovascular:   Negative for chest pain, palpitations, orthopnea, claudication, leg swelling and PND.  Gastrointestinal:  Negative for abdominal pain, blood in stool, constipation, diarrhea, heartburn, melena, nausea and vomiting.  Genitourinary: Negative.  Negative for dysuria.  Musculoskeletal:  Negative for falls, joint pain and myalgias.  Skin:  Negative for rash.  Neurological:  Negative for dizziness, tingling, sensory change, weakness and headaches.  Endo/Heme/Allergies:  Negative for polydipsia.  Psychiatric/Behavioral: Negative.  Negative for depression, memory loss, substance abuse and suicidal ideas. The patient is not nervous/anxious and does not have insomnia.   All other systems reviewed and are negative.   Objective:     Today's Vitals   08/13/21 1608  BP: (!) 102/50  Pulse: 66  Temp: 97.9 F (36.6 C)  SpO2: 94%  Weight: 140 lb (63.5 kg)   Body mass index is 26.45 kg/m.  General appearance: alert, no distress, WD/WN, female HEENT: normocephalic, sclerae anicteric, TMs pearly, nares patent, no discharge or erythema, pharynx normal Oral cavity: MMM, no lesions Neck: supple, no lymphadenopathy, no thyromegaly, no masses Heart: Regular, normal S1, S2, no murmurs Lungs: CTA bilaterally, no wheezes, rhonchi, or rales Abdomen: +bs, soft, non tender, non distended, no masses, no hepatomegaly, no splenomegaly Musculoskeletal: nontender, no swelling, no obvious deformity, neg straight leg raise  Extremities: no edema, no cyanosis, no clubbing Pulses: 2+ symmetric, upper and lower extremities, normal cap refill Neurological: alert, oriented x 3, CN2-12 intact, strength normal upper extremities and lower extremities, sensation normal throughout, DTRs 2+ throughout, no cerebellar signs, gait normal Psychiatric: normal affect, behavior normal, pleasant   Medicare Attestation I have personally reviewed: The patient's medical and social history Their use of alcohol, tobacco or illicit  drugs Their current medications and supplements The patient's functional ability including ADLs,fall risks, home safety risks, cognitive, and hearing and visual impairment Diet and physical activities Evidence for depression or mood disorders  The patient's weight, height, BMI, and visual acuity have been recorded in the chart.  I have made referrals, counseling, and provided education to the patient based on review of the above and I have provided the patient with a written personalized care plan for preventive services.     Magda Bernheim, NP   08/13/2021

## 2021-08-13 ENCOUNTER — Encounter: Payer: Self-pay | Admitting: Nurse Practitioner

## 2021-08-13 ENCOUNTER — Other Ambulatory Visit: Payer: Self-pay

## 2021-08-13 ENCOUNTER — Ambulatory Visit (INDEPENDENT_AMBULATORY_CARE_PROVIDER_SITE_OTHER): Payer: Medicare Other | Admitting: Nurse Practitioner

## 2021-08-13 VITALS — BP 102/50 | HR 66 | Temp 97.9°F | Wt 140.0 lb

## 2021-08-13 DIAGNOSIS — E559 Vitamin D deficiency, unspecified: Secondary | ICD-10-CM | POA: Diagnosis not present

## 2021-08-13 DIAGNOSIS — I10 Essential (primary) hypertension: Secondary | ICD-10-CM | POA: Diagnosis not present

## 2021-08-13 DIAGNOSIS — E785 Hyperlipidemia, unspecified: Secondary | ICD-10-CM | POA: Diagnosis not present

## 2021-08-13 DIAGNOSIS — I700 Atherosclerosis of aorta: Secondary | ICD-10-CM | POA: Diagnosis not present

## 2021-08-13 DIAGNOSIS — M48061 Spinal stenosis, lumbar region without neurogenic claudication: Secondary | ICD-10-CM

## 2021-08-13 DIAGNOSIS — E1122 Type 2 diabetes mellitus with diabetic chronic kidney disease: Secondary | ICD-10-CM

## 2021-08-13 DIAGNOSIS — N184 Chronic kidney disease, stage 4 (severe): Secondary | ICD-10-CM

## 2021-08-13 DIAGNOSIS — I482 Chronic atrial fibrillation, unspecified: Secondary | ICD-10-CM

## 2021-08-13 DIAGNOSIS — K2100 Gastro-esophageal reflux disease with esophagitis, without bleeding: Secondary | ICD-10-CM

## 2021-08-13 DIAGNOSIS — R6889 Other general symptoms and signs: Secondary | ICD-10-CM

## 2021-08-13 DIAGNOSIS — Z0001 Encounter for general adult medical examination with abnormal findings: Secondary | ICD-10-CM | POA: Diagnosis not present

## 2021-08-13 DIAGNOSIS — E1169 Type 2 diabetes mellitus with other specified complication: Secondary | ICD-10-CM | POA: Diagnosis not present

## 2021-08-13 DIAGNOSIS — I5032 Chronic diastolic (congestive) heart failure: Secondary | ICD-10-CM

## 2021-08-13 DIAGNOSIS — Z0000 Encounter for general adult medical examination without abnormal findings: Secondary | ICD-10-CM

## 2021-08-13 NOTE — Patient Instructions (Signed)

## 2021-08-14 ENCOUNTER — Other Ambulatory Visit: Payer: Self-pay | Admitting: Internal Medicine

## 2021-08-14 DIAGNOSIS — G250 Essential tremor: Secondary | ICD-10-CM

## 2021-08-14 LAB — CBC WITH DIFFERENTIAL/PLATELET
Absolute Monocytes: 660 cells/uL (ref 200–950)
Basophils Absolute: 22 cells/uL (ref 0–200)
Basophils Relative: 0.4 %
Eosinophils Absolute: 72 cells/uL (ref 15–500)
Eosinophils Relative: 1.3 %
HCT: 39.9 % (ref 35.0–45.0)
Hemoglobin: 13.2 g/dL (ref 11.7–15.5)
Lymphs Abs: 880 cells/uL (ref 850–3900)
MCH: 30.6 pg (ref 27.0–33.0)
MCHC: 33.1 g/dL (ref 32.0–36.0)
MCV: 92.4 fL (ref 80.0–100.0)
MPV: 11.7 fL (ref 7.5–12.5)
Monocytes Relative: 12 %
Neutro Abs: 3867 cells/uL (ref 1500–7800)
Neutrophils Relative %: 70.3 %
Platelets: 206 10*3/uL (ref 140–400)
RBC: 4.32 10*6/uL (ref 3.80–5.10)
RDW: 14.7 % (ref 11.0–15.0)
Total Lymphocyte: 16 %
WBC: 5.5 10*3/uL (ref 3.8–10.8)

## 2021-08-14 LAB — COMPLETE METABOLIC PANEL WITH GFR
AG Ratio: 2 (calc) (ref 1.0–2.5)
ALT: 14 U/L (ref 6–29)
AST: 20 U/L (ref 10–35)
Albumin: 4.4 g/dL (ref 3.6–5.1)
Alkaline phosphatase (APISO): 124 U/L (ref 37–153)
BUN/Creatinine Ratio: 20 (calc) (ref 6–22)
BUN: 29 mg/dL — ABNORMAL HIGH (ref 7–25)
CO2: 30 mmol/L (ref 20–32)
Calcium: 10 mg/dL (ref 8.6–10.4)
Chloride: 102 mmol/L (ref 98–110)
Creat: 1.48 mg/dL — ABNORMAL HIGH (ref 0.60–0.95)
Globulin: 2.2 g/dL (calc) (ref 1.9–3.7)
Glucose, Bld: 104 mg/dL — ABNORMAL HIGH (ref 65–99)
Potassium: 4.3 mmol/L (ref 3.5–5.3)
Sodium: 139 mmol/L (ref 135–146)
Total Bilirubin: 0.6 mg/dL (ref 0.2–1.2)
Total Protein: 6.6 g/dL (ref 6.1–8.1)
eGFR: 34 mL/min/{1.73_m2} — ABNORMAL LOW (ref >=60)

## 2021-08-14 LAB — LIPID PANEL
Cholesterol: 192 mg/dL (ref <200)
HDL: 70 mg/dL (ref >=50)
LDL Cholesterol (Calc): 105 mg/dL (calc) — ABNORMAL HIGH
Non-HDL Cholesterol (Calc): 122 mg/dL (calc) (ref <130)
Total CHOL/HDL Ratio: 2.7 (calc) (ref <5.0)
Triglycerides: 81 mg/dL (ref <150)

## 2021-08-14 LAB — MICROALBUMIN / CREATININE URINE RATIO
Creatinine, Urine: 60 mg/dL (ref 20–275)
Microalb Creat Ratio: 17 mcg/mg creat (ref <30)
Microalb, Ur: 1 mg/dL

## 2021-08-14 LAB — TSH: TSH: 1.47 mIU/L (ref 0.40–4.50)

## 2021-08-14 LAB — HEMOGLOBIN A1C
Hgb A1c MFr Bld: 5.6 % of total Hgb (ref <5.7)
Mean Plasma Glucose: 114 mg/dL
eAG (mmol/L): 6.3 mmol/L

## 2021-09-01 DIAGNOSIS — N1832 Chronic kidney disease, stage 3b: Secondary | ICD-10-CM | POA: Diagnosis not present

## 2021-09-09 DIAGNOSIS — R350 Frequency of micturition: Secondary | ICD-10-CM | POA: Diagnosis not present

## 2021-09-09 DIAGNOSIS — E1122 Type 2 diabetes mellitus with diabetic chronic kidney disease: Secondary | ICD-10-CM | POA: Diagnosis not present

## 2021-09-09 DIAGNOSIS — I5032 Chronic diastolic (congestive) heart failure: Secondary | ICD-10-CM | POA: Diagnosis not present

## 2021-09-09 DIAGNOSIS — I480 Paroxysmal atrial fibrillation: Secondary | ICD-10-CM | POA: Diagnosis not present

## 2021-09-09 DIAGNOSIS — N1832 Chronic kidney disease, stage 3b: Secondary | ICD-10-CM | POA: Diagnosis not present

## 2021-09-09 DIAGNOSIS — N2581 Secondary hyperparathyroidism of renal origin: Secondary | ICD-10-CM | POA: Diagnosis not present

## 2021-09-09 DIAGNOSIS — I129 Hypertensive chronic kidney disease with stage 1 through stage 4 chronic kidney disease, or unspecified chronic kidney disease: Secondary | ICD-10-CM | POA: Diagnosis not present

## 2021-09-09 DIAGNOSIS — E876 Hypokalemia: Secondary | ICD-10-CM | POA: Diagnosis not present

## 2021-09-13 ENCOUNTER — Other Ambulatory Visit: Payer: Self-pay | Admitting: Internal Medicine

## 2021-09-22 NOTE — Progress Notes (Deleted)
Cardiology Clinic Note   Patient Name: Briana [name] Date of Encounter: 09/22/2021  Primary Care Provider:  Unk Pinto, MD Primary Cardiologist:  Shelva Majestic, MD  Patient Profile    Briana [name] 87 year old female presents today for follow-up of her chronic diastolic CHF, AKI, and PAF.  Past Medical History    Past Medical History:  Diagnosis Date   Acute on chronic diastolic (congestive) heart failure ([place]) 11/21/2018   Cancer ([place])    skin cancer   DDD (degenerative disc disease)    Diverticula of colon    Diverticulitis    DJD (degenerative joint disease)    GERD (gastroesophageal reflux disease)    takes ranitidine   Heart murmur    History of hiatal hernia    History of pneumonia    Hx of irritable bowel syndrome    Hyperlipidemia    Hypertension    Occasional tremors    Osteoporosis    Pre-diabetes    Varicose veins    Vitamin D deficiency    Past Surgical History:  Procedure Laterality Date   APPENDECTOMY     BREAST EXCISIONAL BIOPSY Right    BREAST SURGERY Right    fluid drained from breast   CARDIOVERSION N/A 12/20/2018   Procedure: CARDIOVERSION;  Surgeon: Buford Dresser, MD;  Location: [facility];  Service: Cardiovascular;  Laterality: N/A;   CARDIOVERSION N/A 06/16/2019   Procedure: CARDIOVERSION;  Surgeon: Fay Records, MD;  Location: [facility];  Service: Cardiovascular;  Laterality: N/A;   COLONOSCOPY     ESOPHAGOGASTRODUODENOSCOPY     FLEXIBLE SIGMOIDOSCOPY N/A 03/09/2021   Procedure: FLEXIBLE SIGMOIDOSCOPY;  Surgeon: Mauri Pole, MD;  Location: [facility];  Service: Endoscopy;  Laterality: N/A;   HEMOSTASIS CLIP PLACEMENT  03/09/2021   Procedure: HEMOSTASIS CLIP PLACEMENT;  Surgeon: Mauri Pole, MD;  Location: [facility];  Service: Endoscopy;;   JOINT REPLACEMENT Left    left hip   LUMBAR LAMINECTOMY/DECOMPRESSION MICRODISCECTOMY N/A 06/05/2015   Procedure: L3-L5 DECOMPRESSION ;  Surgeon: Melina Schools, MD;   Location: [facility];  Service: Orthopedics;  Laterality: N/A;   OPEN REDUCTION INTERNAL FIXATION (ORIF) DISTAL RADIAL FRACTURE Right 01/10/2014   Procedure: OPEN REDUCTION INTERNAL FIXATION (ORIF) DISTAL RADIAL FRACTURE;  Surgeon: Linna Hoff, MD;  Location: [facility];  Service: Orthopedics;  Laterality: Right;   SPINAL CORD DECOMPRESSION  06/05/2015   L3 L 5   THYROID SURGERY     TONSILLECTOMY     TUBAL LIGATION     varicose veins stripped     WRIST FRACTURE SURGERY Bilateral     Allergies  Allergies  Allergen Reactions   Accupril [Quinapril Hcl] Cough   Neosporin [Neomycin-Bacitracin Zn-Polymyx]     unknown   Prednisone     Agitated and shaky   Reglan [Metoclopramide] Other (See Comments)    tremors   Tramadol Nausea And Vomiting   Adhesive [Tape] Rash    History of Present Illness    Briana [name] has a PMH of chronic diastolic CHF, atrial fibrillation diagnosed in 2020, GERD, hyperlipidemia, prediabetes, and IBS.  She underwent DCCV on 2/20 and again on 06/16/2019.  Her second cardioversion was complicated by significant bradycardia prompting discontinuation of her atenolol.  Her atenolol was later restarted.  Subsequent echocardiogram showed an EF of 60 to 65%.  She was last seen by me on 9/21 and was doing well at that time.  She contacted the nurse triage line on 01/09/2021 with complaints of weight gain  and lower extremity swelling.  She was instructed to take additional Bumex as needed for weight gain of 2-3 pounds overnight or 5 pounds in a single week.  She called again 01/16/2021 and reported difficulty with her breathing.  She was instructed to take an additional dose of Bumex.  After that she noted 3 days of progressive weakness and difficulty with ambulation.  Her neighbor stopped at her house to check on her 01/22/2021.  She was found on her floor and EMS was called.  She was noted to be hypoxic with an O2 saturation of 88 which improved with 2 L nasal cannula.  While in the ED she was  noted to be tachypneic.  Her potassium was 2.9.  Her initial creatinine was 2.09 (baseline 0.9).  Her WBCs were elevated at 15.6.  Her D-dimer was elevated.  She was COVID negative.  Her TSH was 0.32.  A VQ scan was negative for PE.  She was started on IV fluid with concern for dehydration.  An antibiotic was started for possible UTI.  An echocardiogram at that time showed an LVEF of 55%, moderately elevated PA pressures, mild-moderate MR.  Her home olmesartan and nadolol were held due to acute renal failure and bradycardia.  She was initially given IV fluids but subsequently placed on IV diuretic.  With IV diuresis her renal function improved.  Her apixaban was reduced to 2.5 mg due to her age and creatinine clearance.  She was discharged on amlodipine 2.5, amiodarone 200, furosemide 80 twice daily, apixaban 2.5 mg twice daily, and potassium supplementation.  Her discharge weight was 150 pounds which was down from 163 pounds.  She was seen and followed by Almyra Deforest PA-C on 01/31/2021.  Her weight at that time was 149.8 pounds.  She reported some degree of dyspnea on exertion but denied shortness of breath at rest.  She reported tenderness under bilateral breast.  It did not appear to be related to cardiac issues.  It was felt that it may be related to her bra position.  She did not have significant lower extremity edema and her lungs were clear.  Her EKG showed normal sinus rhythm.  She was admitted to the hospital 03/06/2021 with rectal bleeding, abdominal cramping.  She was noted to have a hemoglobin drop from 12-10.5-9.  GI was consulted and she underwent flexible sigmoidoscopy.  It was recommended that her Eliquis be held for 5 days.  Her repeat hemoglobin stabilized.  It was felt that her lower GI bleed was a result of diverticulosis and internal hemorrhoids.  There was evidence of a impacted diverticular.  She presented to the clinic 03/25/2021 for follow-up evaluation stated she felt well.  She presented  with her daughter Briana [name].  She had been doing physical therapy 1-2 times per week.  We reviewed her 2 recent hospitalizations.  She was using a cane when she ambulated to her mailbox or down her driveway.  She had not had further episodes of bleeding.  She denied chest pain and had been slowly increasing her activity.  I gave her the salty 6 diet sheet and planned follow-up in 6 months.  She presents to the clinic today for 53-month follow-up evaluation.  She states***  Today she denies chest pain, shortness of breath, lower extremity edema, fatigue, palpitations, melena, hematuria, hemoptysis, diaphoresis, weakness, presyncope, syncope, orthopnea, and PND.   Home Medications    Prior to Admission medications   Medication Sig Start Date End Date Taking? Authorizing Provider  acetaminophen (TYLENOL) 500 MG tablet Take 500 mg by mouth every 6 (six) hours as needed for moderate pain.    [provider]  amiodarone (PACERONE) 200 MG tablet Take 1 tablet by mouth once daily 02/24/21   Troy Sine, MD  amLODipine (NORVASC) 2.5 MG tablet Take 1 tablet (2.5 mg total) by mouth daily. 02/21/21   Lendon Colonel, NP  apixaban (ELIQUIS) 2.5 MG TABS tablet Take 1 tablet (2.5 mg total) by mouth 2 (two) times daily. 03/16/21   Hosie Poisson, MD  Carboxymeth-Glycerin-Polysorb (REFRESH DIGITAL OP) Place 1 drop into the right eye daily as needed (dry eyes).    [provider]  cholecalciferol (VITAMIN D3) 25 MCG (1000 UT) tablet Take 1,000 Units by mouth 2 (two) times daily.    [provider]  Cyanocobalamin (B-12 SL) Place 1 tablet under the tongue daily.    [provider]  diazepam (VALIUM) 2 MG tablet Take  1 tablet 3 to 4  x /day  with Meals  for Tremor 03/10/21   Hosie Poisson, MD  furosemide (LASIX) 80 MG tablet Take 1 tablet (80 mg total) by mouth 2 (two) times daily. 02/25/21   Troy Sine, MD  Misc Natural Products (OSTEO BI-FLEX JOINT SHIELD PO) Take 1 tablet by  mouth 2 (two) times daily.     [provider]  Multiple Vitamins-Minerals (PRESERVISION AREDS 2 PO) Take 1 capsule by mouth 2 (two) times daily.    [provider]  Olopatadine HCl (PATADAY OP) Place 1 drop into the left eye daily.    [provider]  polyethylene glycol (MIRALAX / GLYCOLAX) 17 g packet Take 17 g by mouth daily. 03/11/21   Hosie Poisson, MD  potassium chloride SA (KLOR-CON) 20 MEQ tablet Take 1 tablet (20 mEq total) by mouth 2 (two) times daily. 03/20/21   Unk Pinto, MD  PROAIR HFA 108 [phone] Base) MCG/ACT inhaler Inhale 2 puffs into the lungs every 6 (six) hours as needed for wheezing or shortness of breath. 01/28/21   Hongalgi, Lenis Dickinson, MD  Pyridoxine HCl (VITAMIN B-6 PO) Take 1 capsule by mouth daily.    [provider]  zinc gluconate 50 MG tablet Take 50 mg by mouth daily.    [provider]    Family History    Family History  Problem Relation Age of Onset   Hypertension Mother    Hyperlipidemia Mother    Heart disease Sister    Cancer Brother        prostate   Colon cancer Neg Hx    Esophageal cancer Neg Hx    Pancreatic cancer Neg Hx    Stomach cancer Neg Hx    Liver disease Neg Hx    She indicated that her mother is deceased. She indicated that her father is deceased. She indicated that her sister is deceased. She indicated that her brother is alive. She indicated that the status of her neg hx is unknown.  Social History    Social History   Socioeconomic History   Marital status: Widowed    Spouse name: Not on file   Number of children: Not on file   Years of education: Not on file   Highest education level: Not on file  Occupational History   Not on file  Tobacco Use   Smoking status: Never   Smokeless tobacco: Never  Substance and Sexual Activity   Alcohol use: No   Drug use: No   Sexual  activity: Not on file  Other Topics Concern   Not on file  Social History Narrative   Not on file   Social  Determinants of Health   Financial Resource Strain: Not on file  Food Insecurity: Not on file  Transportation Needs: Not on file  Physical Activity: Not on file  Stress: Not on file  Social Connections: Not on file  Intimate Partner Violence: Not on file     Review of Systems    General:  No chills, fever, night sweats or weight changes.  Cardiovascular:  No chest pain, dyspnea on exertion, edema, orthopnea, palpitations, paroxysmal nocturnal dyspnea. Dermatological: No rash, lesions/masses Respiratory: No cough, dyspnea Urologic: No hematuria, dysuria Abdominal:   No nausea, vomiting, diarrhea, bright red blood per rectum, melena, or hematemesis Neurologic:  No visual changes, wkns, changes in mental status. All other systems reviewed and are otherwise negative except as noted above.  Physical Exam    VS:  There were no vitals taken for this visit. , BMI There is no height or weight on file to calculate BMI. GEN: Well nourished, well developed, in no acute distress. HEENT: normal. Neck: Supple, no JVD, carotid bruits, or masses. Cardiac: RRR, no murmurs, rubs, or gallops. No clubbing, cyanosis, edema.  Radials/DP/PT 2+ and equal bilaterally.  Respiratory:  Respirations regular and unlabored, clear to auscultation bilaterally. GI: Soft, nontender, nondistended, BS + x 4. MS: no deformity or atrophy. Skin: warm and dry, no rash. Neuro:  Strength and sensation are intact. Psych: Normal affect.  Accessory Clinical Findings    Recent Labs: 01/22/2021: B Natriuretic Peptide 464.4 03/05/2021: Magnesium 2.2 08/13/2021: ALT 14; BUN 29; Creat 1.48; Hemoglobin 13.2; Platelets 206; Potassium 4.3; Sodium 139; TSH 1.47   Recent Lipid Panel    Component Value Date/Time   CHOL 192 08/13/2021 1707   TRIG 81 08/13/2021 1707   HDL 70 08/13/2021 1707   CHOLHDL 2.7 08/13/2021 1707   VLDL 15 05/04/2017 1339   LDLCALC 105 (H) 08/13/2021 1707    ECG personally reviewed by me today-none  today.  Echocardiogram 01/24/2021 IMPRESSIONS     1. Left ventricular ejection fraction, by estimation, is 55%. The left  ventricle has normal function. The left ventricle has no regional wall  motion abnormalities. There is mild left ventricular hypertrophy. Left  ventricular diastolic parameters are  indeterminate.   2. Right ventricular systolic function is normal. The right ventricular  size is mildly enlarged. There is moderately elevated pulmonary artery  systolic pressure. The estimated right ventricular systolic pressure is  09.3 mmHg.   3. Left atrial size was moderately dilated.   4. Right atrial size was mildly dilated.   5. The mitral valve is grossly normal. Mild to moderate mitral valve  regurgitation. No evidence of mitral stenosis.   6. The aortic valve is grossly normal. Aortic valve regurgitation is not  visualized. No aortic stenosis is present.   7. The inferior vena cava is dilated in size with <50% respiratory  variability, suggesting right atrial pressure of 15 mmHg.   Assessment & Plan   1.  Chronic diastolic CHF-continues to be euvolemic.  No increased DOE or activity intolerance.  Weight today 143 ***pounds.  Echocardiogram 01/24/2021 showed EF 55%, intermediate diastolic parameters, moderately dilated left atria, mildly dilated right atria, mild-moderate mitral valve regurgitation, and no aortic stenosis. Continue furosemide,  Heart healthy low-sodium diet-salty 6 given-reviewed Maintain physical activity Continue Daily weights   Paroxysmal atrial fibrillation-heart rate today ***.  Denies bleeding  issues.  Continues to be cardiac unaware.CHA2DS2/VAS Stroke Risk Points 6 high risk (chf,htn,age, diabetes, female) Continue apixaban, amiodarone Heart healthy low-sodium diet-salty 6 given Maintain physical activity Avoid triggers caffeine, chocolate, EtOH, dehydration etc.  Essential hypertension-BP today ***130/70.  Well-controlled at home. Continue  amlodipine, furosemide, potassium Heart healthy low-sodium diet-salty 6 given Increase physical activity as tolerated  AKI-creatinine 1.48 on 08/13/2021. Follows with PCP   Disposition: Follow-up with Dr. Claiborne Billings or me in 6 months.  Jossie Ng. Ebony Yorio NP-C    09/22/2021, 7:05 AM [place] [address] Office [phone] Fax [phone]  Notice: This dictation was prepared with Dragon dictation along with smaller phrase technology. Any transcriptional errors that result from this process are unintentional and may not be corrected upon review.  I spent 15 minutes examining this patient, reviewing medications, and using patient centered shared decision making involving her cardiac care.  Prior to her visit I spent greater than 20 minutes reviewing her past medical history,  medications, and prior cardiac tests.

## 2021-09-23 ENCOUNTER — Ambulatory Visit: Payer: Medicare Other | Admitting: General Practice

## 2021-09-23 NOTE — Progress Notes (Signed)
Cardiology Clinic Note   Patient Name: Briana [name] Date of Encounter: 09/24/2021  Primary Care Provider:  Unk Pinto, MD Primary Cardiologist:  Briana Majestic, MD  Patient Profile    Briana [name] 87 year old female presents today for follow-up of her chronic diastolic CHF, AKI, and PAF.  Past Medical History    Past Medical History:  Diagnosis Date   Acute on chronic diastolic (congestive) heart failure ([facility]) 11/21/2018   Cancer ([facility])    skin cancer   DDD (degenerative disc disease)    Diverticula of colon    Diverticulitis    DJD (degenerative joint disease)    GERD (gastroesophageal reflux disease)    takes ranitidine   Heart murmur    History of hiatal hernia    History of pneumonia    Hx of irritable bowel syndrome    Hyperlipidemia    Hypertension    Occasional tremors    Osteoporosis    Pre-diabetes    Varicose veins    Vitamin D deficiency    Past Surgical History:  Procedure Laterality Date   APPENDECTOMY     BREAST EXCISIONAL BIOPSY Right    BREAST SURGERY Right    fluid drained from breast   CARDIOVERSION N/A 12/20/2018   Procedure: CARDIOVERSION;  Surgeon: Briana Dresser, MD;  Location: [facility];  Service: Cardiovascular;  Laterality: N/A;   CARDIOVERSION N/A 06/16/2019   Procedure: CARDIOVERSION;  Surgeon: Briana Records, MD;  Location: [facility];  Service: Cardiovascular;  Laterality: N/A;   COLONOSCOPY     ESOPHAGOGASTRODUODENOSCOPY     FLEXIBLE SIGMOIDOSCOPY N/A 03/09/2021   Procedure: FLEXIBLE SIGMOIDOSCOPY;  Surgeon: Briana Pole, MD;  Location: [facility];  Service: Endoscopy;  Laterality: N/A;   HEMOSTASIS CLIP PLACEMENT  03/09/2021   Procedure: HEMOSTASIS CLIP PLACEMENT;  Surgeon: Briana Pole, MD;  Location: [facility];  Service: Endoscopy;;   JOINT REPLACEMENT Left    left hip   LUMBAR LAMINECTOMY/DECOMPRESSION MICRODISCECTOMY N/A 06/05/2015   Procedure: L3-L5 DECOMPRESSION ;  Surgeon: Briana Schools, MD;   Location: [facility];  Service: Orthopedics;  Laterality: N/A;   OPEN REDUCTION INTERNAL FIXATION (ORIF) DISTAL RADIAL FRACTURE Right 01/10/2014   Procedure: OPEN REDUCTION INTERNAL FIXATION (ORIF) DISTAL RADIAL FRACTURE;  Surgeon: Briana Hoff, MD;  Location: [facility];  Service: Orthopedics;  Laterality: Right;   SPINAL CORD DECOMPRESSION  06/05/2015   L3 L 5   THYROID SURGERY     TONSILLECTOMY     TUBAL LIGATION     varicose veins stripped     WRIST FRACTURE SURGERY Bilateral     Allergies  Allergies  Allergen Reactions   Accupril [Quinapril Hcl] Cough   Neosporin [Neomycin-Bacitracin Zn-Polymyx]     unknown   Prednisone     Agitated and shaky   Reglan [Metoclopramide] Other (See Comments)    tremors   Tramadol Nausea And Vomiting   Adhesive [Tape] Rash    History of Present Illness    Ms. Briana [name] has a PMH of chronic diastolic CHF, atrial fibrillation diagnosed in 2020, GERD, hyperlipidemia, prediabetes, and IBS.  She underwent DCCV on 2/20 and again on 06/16/2019.  Her second cardioversion was complicated by significant bradycardia prompting discontinuation of her atenolol.  Her atenolol was later restarted.  Subsequent echocardiogram showed an EF of 60 to 65%.  She was last seen by me on 9/21 and was doing well at that time.  She contacted the nurse triage line on 01/09/2021 with complaints of weight gain  and lower extremity swelling.  She was instructed to take additional Bumex as needed for weight gain of 2-3 pounds overnight or 5 pounds in a single week.  She called again 01/16/2021 and reported difficulty with her breathing.  She was instructed to take an additional dose of Bumex.  After that she noted 3 days of progressive weakness and difficulty with ambulation.  Her neighbor stopped at her house to check on her 01/22/2021.  She was found on her floor and EMS was called.  She was noted to be hypoxic with an O2 saturation of 88 which improved with 2 L nasal cannula.  While in the ED she was  noted to be tachypneic.  Her potassium was 2.9.  Her initial creatinine was 2.09 (baseline 0.9).  Her WBCs were elevated at 15.6.  Her D-dimer was elevated.  She was COVID negative.  Her TSH was 0.32.  A VQ scan was negative for PE.  She was started on IV fluid with concern for dehydration.  An antibiotic was started for possible UTI.  An echocardiogram at that time showed an LVEF of 55%, moderately elevated PA pressures, mild-moderate MR.  Her home olmesartan and nadolol were held due to acute renal failure and bradycardia.  She was initially given IV fluids but subsequently placed on IV diuretic.  With IV diuresis her renal function improved.  Her apixaban was reduced to 2.5 mg due to her age and creatinine clearance.  She was discharged on amlodipine 2.5, amiodarone 200, furosemide 80 twice daily, apixaban 2.5 mg twice daily, and potassium supplementation.  Her discharge weight was 150 pounds which was down from 163 pounds.  She was seen and followed by Briana Deforest PA-C on 01/31/2021.  Her weight at that time was 149.8 pounds.  She reported some degree of dyspnea on exertion but denied shortness of breath at rest.  She reported tenderness under bilateral breast.  It did not appear to be related to cardiac issues.  It was felt that it may be related to her bra position.  She did not have significant lower extremity edema and her lungs were clear.  Her EKG showed normal sinus rhythm.  She was admitted to the hospital 03/06/2021 with rectal bleeding, abdominal cramping.  She was noted to have a hemoglobin drop from 12-10.5-9.  GI was consulted and she underwent flexible sigmoidoscopy.  It was recommended that her Eliquis be held for 5 days.  Her repeat hemoglobin stabilized.  It was felt that her lower GI bleed was a result of diverticulosis and internal hemorrhoids.  There was evidence of a impacted diverticular.  She presented to the clinic 03/25/2021 for follow-up evaluation stated she felt well.  She presented  with her daughter Briana [name].  She had been doing physical therapy 1-2 times per week.  We reviewed her 2 recent hospitalizations.  She was using a cane when she ambulated to her mailbox or down her driveway.  She had not had further episodes of bleeding.  She denied chest pain and had been slowly increasing her activity.  I gave her the salty 6 diet sheet and planned follow-up in 6 months.  She presents to the clinic today for 84-month follow-up evaluation.  She states she has noticed that her blood pressure has been somewhat elevated at home.  We reviewed her blood pressure log and it appears her blood pressures are labile with 130s over 60s-70s and 150s over 60s-70s.  We reviewed the importance of having uncrossed legs, pausing before  taking blood pressure, having arm at chest height eccentric.  She has not noticed any irregular or accelerated beats.  She has been staying physically active by walking at the track and has generally felt well.  Her daughter Briana [name] presents with her and reports that she feels like she has been doing better with her hydration.  We discussed maintaining blood pressure log, the possibility of adding hydralazine to her blood pressure medication regimen, and plan follow-up for 6 months.  At this time we will continue her current medication regimen and continue blood pressure log.  Today she denies chest pain, shortness of breath, lower extremity edema, fatigue, palpitations, melena, hematuria, hemoptysis, diaphoresis, weakness, presyncope, syncope, orthopnea, and PND.  Home Medications    Prior to Admission medications   Medication Sig Start Date End Date Taking? Authorizing Provider  acetaminophen (TYLENOL) 500 MG tablet Take 500 mg by mouth every 6 (six) hours as needed for moderate pain.    [provider]  amiodarone (PACERONE) 200 MG tablet Take 1 tablet (200 mg total) by mouth daily. 07/23/21   Briana Pinto, MD  apixaban (ELIQUIS) 2.5 MG TABS tablet Take 1 tablet  (2.5 mg total) by mouth 2 (two) times daily. 03/25/21   Deberah Pelton, NP  Carboxymeth-Glycerin-Polysorb (REFRESH DIGITAL OP) Place 1 drop into the right eye daily as needed (dry eyes).    [provider]  Cyanocobalamin (B-12 SL) Place 1 tablet under the tongue daily.    [provider]  diazepam (VALIUM) 2 MG tablet Take  1 tablet  4 x /day  for Tremor          / TAKE 1 TABLET BY MOUTH 3- 4 TIMES DAILY WITH MEALS FOR TREMOR 08/14/21   Briana Pinto, MD  diltiazem (CARDIZEM CD) 180 MG 24 hr capsule Take  1 capsule  Daily  for Heart Rhythm & BP                         /          Take 1 capsule by mouth once daily 09/13/21   Briana Pinto, MD  furosemide (LASIX) 80 MG tablet Take 1 tablet (80 mg total) by mouth 2 (two) times daily. 02/25/21   Troy Sine, MD  polyethylene glycol (MIRALAX / GLYCOLAX) 17 g packet Take 17 g by mouth daily. 03/11/21   Hosie Poisson, MD  potassium chloride SA (KLOR-CON) 20 MEQ tablet Take 1 tablet (20 mEq total) by mouth 2 (two) times daily. Patient taking differently: Take 20 mEq by mouth daily. 03/20/21   Briana Pinto, MD  PROAIR HFA 108 [phone] Base) MCG/ACT inhaler Inhale 2 puffs into the lungs every 6 (six) hours as needed for wheezing or shortness of breath. 01/28/21   Hongalgi, Lenis Dickinson, MD  Pyridoxine HCl (VITAMIN B-6 PO) Take 1 capsule by mouth daily.    [provider]  vitamin C (ASCORBIC ACID) 500 MG tablet Take 500 mg by mouth daily.    [provider]  zinc gluconate 50 MG tablet Take 50 mg by mouth daily.    [provider]    Family History    Family History  Problem Relation Age of Onset   Hypertension Mother    Hyperlipidemia Mother    Heart disease Sister    Cancer Brother        prostate   Colon cancer Neg Hx    Esophageal cancer Neg Hx  Pancreatic cancer Neg Hx    Stomach cancer Neg Hx    Liver disease Neg Hx    She indicated that her mother is deceased. She indicated that her father is  deceased. She indicated that her sister is deceased. She indicated that her brother is alive. She indicated that the status of her neg hx is unknown.  Social History    Social History   Socioeconomic History   Marital status: Widowed    Spouse name: Not on file   Number of children: Not on file   Years of education: Not on file   Highest education level: Not on file  Occupational History   Not on file  Tobacco Use   Smoking status: Never   Smokeless tobacco: Never  Substance and Sexual Activity   Alcohol use: No   Drug use: No   Sexual activity: Not on file  Other Topics Concern   Not on file  Social History Narrative   Not on file   Social Determinants of Health   Financial Resource Strain: Not on file  Food Insecurity: Not on file  Transportation Needs: Not on file  Physical Activity: Not on file  Stress: Not on file  Social Connections: Not on file  Intimate Partner Violence: Not on file     Review of Systems    General:  No chills, fever, night sweats or weight changes.  Cardiovascular:  No chest pain, dyspnea on exertion, edema, orthopnea, palpitations, paroxysmal nocturnal dyspnea. Dermatological: No rash, lesions/masses Respiratory: No cough, dyspnea Urologic: No hematuria, dysuria Abdominal:   No nausea, vomiting, diarrhea, bright red blood per rectum, melena, or hematemesis Neurologic:  No visual changes, wkns, changes in mental status. All other systems reviewed and are otherwise negative except as noted above.  Physical Exam    VS:  BP 118/72   Pulse 69   Ht 5\' 1"  (1.549 m)   Wt 139 lb 3.2 oz (63.1 kg)   SpO2 97%   BMI 26.30 kg/m  , BMI Body mass index is 26.3 kg/m. GEN: Well nourished, well developed, in no acute distress. HEENT: normal. Neck: Supple, no JVD, carotid bruits, or masses. Cardiac: RRR, no murmurs, rubs, or gallops. No clubbing, cyanosis, edema.  Radials/DP/PT 2+ and equal bilaterally.  Respiratory:  Respirations regular and  unlabored, clear to auscultation bilaterally. GI: Soft, nontender, nondistended, BS + x 4. MS: no deformity or atrophy. Skin: warm and dry, no rash. Neuro:  Strength and sensation are intact. Psych: Normal affect.  Accessory Clinical Findings    Recent Labs: 01/22/2021: B Natriuretic Peptide 464.4 03/05/2021: Magnesium 2.2 08/13/2021: ALT 14; BUN 29; Creat 1.48; Hemoglobin 13.2; Platelets 206; Potassium 4.3; Sodium 139; TSH 1.47   Recent Lipid Panel    Component Value Date/Time   CHOL 192 08/13/2021 1707   TRIG 81 08/13/2021 1707   HDL 70 08/13/2021 1707   CHOLHDL 2.7 08/13/2021 1707   VLDL 15 05/04/2017 1339   LDLCALC 105 (H) 08/13/2021 1707    ECG personally reviewed by me today-none today.  Echocardiogram 01/24/2021 IMPRESSIONS     1. Left ventricular ejection fraction, by estimation, is 55%. The left  ventricle has normal function. The left ventricle has no regional wall  motion abnormalities. There is mild left ventricular hypertrophy. Left  ventricular diastolic parameters are  indeterminate.   2. Right ventricular systolic function is normal. The right ventricular  size is mildly enlarged. There is moderately elevated pulmonary artery  systolic pressure. The estimated right ventricular  systolic pressure is  21.1 mmHg.   3. Left atrial size was moderately dilated.   4. Right atrial size was mildly dilated.   5. The mitral valve is grossly normal. Mild to moderate mitral valve  regurgitation. No evidence of mitral stenosis.   6. The aortic valve is grossly normal. Aortic valve regurgitation is not  visualized. No aortic stenosis is present.   7. The inferior vena cava is dilated in size with <50% respiratory  variability, suggesting right atrial pressure of 15 mmHg.    Assessment & Plan   1.  Chronic diastolic CHF-continues to be euvolemic.  No increased DOE or activity intolerance.  Weight today 139 pounds.  Echocardiogram 01/24/2021 showed EF 55%, intermediate  diastolic parameters, moderately dilated left atria, mildly dilated right atria, mild-moderate mitral valve regurgitation, and no aortic stenosis. Continue furosemide  Heart healthy low-sodium diet-salty 6 given-reviewed Maintain physical activity Continue Daily weights   Paroxysmal atrial fibrillation-heart rate today 69 bpm.  Denies bleeding issues.  Continues to be cardiac unaware.CHA2DS2/VAS Stroke Risk Points 6 high risk (chf,htn,age, diabetes, female) Continue apixaban, amiodarone, diltiazem Heart healthy low-sodium diet-salty 6 given Maintain physical activity Avoid triggers caffeine, chocolate, EtOH, dehydration etc.  Essential hypertension-BP today 118/72.  Well-controlled at home. Continue furosemide, potassium, diltiazem Heart healthy low-sodium diet-salty 6 given Increase physical activity as tolerated  AKI-creatinine 1.48 on 08/13/2021. Follows with PCP, nephrology   Disposition: Follow-up with Dr. Claiborne Billings or me in 6 months.   Jossie Ng. Mikel Hardgrove NP-C    09/24/2021, 12:08 PM [address] Office [phone] Fax [phone]  Notice: This dictation was prepared with Dragon dictation along with smaller phrase technology. Any transcriptional errors that result from this process are unintentional and may not be corrected upon review.  I spent 15 minutes examining this patient, reviewing medications, and using patient centered shared decision making involving her cardiac care.  Prior to her visit I spent greater than 20 minutes reviewing her past medical history,  medications, and prior cardiac tests.

## 2021-09-24 ENCOUNTER — Encounter: Payer: Self-pay | Admitting: General Practice

## 2021-09-24 ENCOUNTER — Other Ambulatory Visit: Payer: Self-pay

## 2021-09-24 ENCOUNTER — Ambulatory Visit: Payer: Medicare Other | Admitting: General Practice

## 2021-09-24 VITALS — BP 118/72 | HR 69 | Ht 61.0 in | Wt 139.2 lb

## 2021-09-24 DIAGNOSIS — I10 Essential (primary) hypertension: Secondary | ICD-10-CM | POA: Diagnosis not present

## 2021-09-24 DIAGNOSIS — I5032 Chronic diastolic (congestive) heart failure: Secondary | ICD-10-CM | POA: Diagnosis not present

## 2021-09-24 DIAGNOSIS — I480 Paroxysmal atrial fibrillation: Secondary | ICD-10-CM

## 2021-09-24 DIAGNOSIS — N179 Acute kidney failure, unspecified: Secondary | ICD-10-CM

## 2021-09-24 NOTE — Patient Instructions (Signed)
Medication Instructions:  The current medical regimen is effective;  continue present plan and medications as directed. Please refer to the Current Medication list given to you today.   *If you need a refill on your cardiac medications before your next appointment, please call your pharmacy*  Lab Work:   Testing/Procedures:  NONE    NONE  Special Instructions PLEASE READ AND FOLLOW SALTY 6-ATTACHED-1,800mg  daily  PLEASE MAINTAIN PHYSICAL ACTIVITY AS TOLERATED   INCREASE HYDRATION   TAKE AND LOG YOUR BLOOD PRESSURE, CALL IF BLOOD PRESSURE IS >140/>90.  Follow-Up: Your next appointment:  6 month(s) In Person with Shelva Majestic, MD   Please call our office 2 months in advance to schedule this appointment   At [facility], you and your health needs are our priority.  As part of our continuing mission to provide you with exceptional heart care, we have created designated Provider Care Teams.  These Care Teams include your primary Cardiologist (physician) and Advanced Practice Providers (APPs -  Physician Assistants and Nurse Practitioners) who all work together to provide you with the care you need, when you need it.  We recommend signing up for the patient portal called "MyChart".  Sign up information is provided on this After Visit Summary.  MyChart is used to connect with patients for Virtual Visits (Telemedicine).  Patients are able to view lab/test results, encounter notes, upcoming appointments, etc.  Non-urgent messages can be sent to your provider as well.   To learn more about what you can do with MyChart, go to [email or web address].              6 SALTY THINGS TO AVOID     1,800MG  DAILY

## 2021-10-02 ENCOUNTER — Telehealth: Payer: Medicare Other | Admitting: Pharmacist

## 2021-10-07 ENCOUNTER — Ambulatory Visit: Payer: Medicare Other | Admitting: Pharmacist

## 2021-10-23 ENCOUNTER — Other Ambulatory Visit: Payer: Self-pay

## 2021-10-23 ENCOUNTER — Ambulatory Visit: Payer: Medicare Other | Admitting: Pharmacist

## 2021-10-23 DIAGNOSIS — N183 Chronic kidney disease, stage 3 unspecified: Secondary | ICD-10-CM

## 2021-10-23 DIAGNOSIS — E1169 Type 2 diabetes mellitus with other specified complication: Secondary | ICD-10-CM

## 2021-10-23 DIAGNOSIS — I5032 Chronic diastolic (congestive) heart failure: Secondary | ICD-10-CM

## 2021-10-23 DIAGNOSIS — E785 Hyperlipidemia, unspecified: Secondary | ICD-10-CM

## 2021-10-23 DIAGNOSIS — I10 Essential (primary) hypertension: Secondary | ICD-10-CM

## 2021-10-23 DIAGNOSIS — I700 Atherosclerosis of aorta: Secondary | ICD-10-CM

## 2021-10-23 DIAGNOSIS — M85831 Other specified disorders of bone density and structure, right forearm: Secondary | ICD-10-CM

## 2021-10-29 NOTE — Progress Notes (Signed)
[facility] - Patient Visit with Chart Prep (v 2.6.0)  LASHAUNTA, SICARD [id number] 89 years, Female  DOB: 1934/04/08  M: ([phone]) [phone] Care Team:   Imogene Burn, Trader   __________________________________________________ Summary: Reem is a friendly 87 year old woman who completes the initial CCM phone call with her daughter [name]. She traveled to [place] to spend a few days with her daughter. She complains of feeling sluggish, tired on some days due to lack of sufficient sleep.  Recommendations/Changes made from today's visit: Recommended patient to start Calcium Citrate 536m BID for osteopenia Consider patient for BP RPM in future   Patient scheduled for CCM visit with the clinical pharmacist.  Patient is referred for CCM by their PCP and CPP is under general PCP supervision.: At least 2 of these conditions are expected to last 12 months or longer and patient is at significant risk for acute exacerbations and/or functional decline.  [place] Visit Type: Phone Call Date of Upcoming Visit: 10/23/2021  Pre-Call Questions ([facility] Are you able to connect with Patient: Yes Visit Type: Phone Call May we confirm what is the best phone # for the pharmacist to call you?: RSuanne Marker(daughter) [phone] visit date/time: 12/8 '@3pm'  Have you seen any other providers since your last visit?: No Any changes in your medicines or health?: No Any side effects from any medications?: No Do you have any symptoms or problems not managed by your medications?: No What are your top 2 health concerns right now?: blood pressure  legs aching/jerking What are your top 2 medication concerns right now?: Cost Has your Dr asked that you take BP, BG or special diet at home?: Monitor BP, Monitor weight Do you get any type of exercise on a regular basis?: Yes What type of exercise and how often?: walking What are your top 1 or 2 goals for your health?:  improvement in BP and body aches What are you doing to try to reach these goals?: Monitoring BP, Monitoring weight What, if any, problems do you have getting your medications from the pharmacy?: Financial barriers Details: Eliquis Is there anything else that you would like to discuss during the appointment?: No  Patient Chart Prep (HC)  Chronic Conditions Patient's Chronic Conditions: Hypertension (HTN), Diastolic Heart Failure / HFpEF, Cardiovascular Disease (CVD), Gastroesophageal Reflux Disease (GERD), Diabetes (DM), Chronic Kidney Disease (CKD), Osteopenia or Osteoporosis, Other, Hyperlipidemia/Dyslipidemia (HLD), Vitamin D deficiency, lumbar stenosis, thrombophillia, Bilateral sensorineutral hearing loss, HZV  Doctor and Hospital Visits Were there PCP Visits in last 6 months?: Yes Visit #1: 08/13/2021- DMarta Lamas NP- Patient was seen for a wellness exam. Stopped Cholecalciferol, Misc Natural Products, and Multiple Vitamins.  Visit #2: 05/26/2021- Dr. MMelford Aase Patient was seen for chronic care follow up. No medication changes. Were there Specialist Visits in last 6 months?: Yes Visit #1: 09/24/2021- JColetta Memos NP- Patient was seen for a follow-up. No medication changes.  Was there a Hospital Visit in last 30 days?: No Were there other Hospital Visits in last 6 months?: No  Medication Information Are there any Medication discrepancies?: No Are there any Medication adherence gaps (beyond 5 days past due)?: No Medication adherence rates for the STAR rating drugs: NONE List Patient's current Care Gaps: No current Care Gaps identified  Disease Assessments  Subjective Information Current BP: 118/72 Current HR: 69 taken on: 09/24/2021 Previous BP: 102/50 Previous HR: 66 taken on: 08/12/2021 Weight: 139 BMI: 26.32 Last GFR: 34 taken on: 08/12/2021  Why did the patient present?: CCM initial visit Marital status?: Widowed 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 She feels sluggish and tired on some days Who does the patient spend their time with and what do they do?: Pt lives alone. Daughter lives 30 minutes away and the talk every other day. The daughter comes in a couple of times per week. She checks her and puts her meds in pill boxes every week. The other daughter [name] comes 1 time a week as well Lifestyle habits such as diet and exercise?: Breakfast between 9 and 10am: Oatmeal, strawberries blueberries and walnuts. will sometimes eat scrambled eggs, toast, and sandwich thins, Bananas Lunch: sandwich with whole wheat bread Supper: Pt will cook or go out to eat with girlfriends  Exercise: [place] with girlfriends walking track. 2 days per week. Walk at [employer] to the mall. there for now. Coffee: once in a while decaf cofee woth milk Alcohol, tobacco, and illicit drug usage?: Pt will drink beer once in a while to calm nerves No tobacco No illegal drugs What is the patient's sleep pattern?: Trouble falling asleep, Other (with free form text) Details: Wakes up 2 or three times in the night. Patient also naps throughout the day. Pt feels like when she sleeps to early she wakes up but when she sleeps later in the night, she tends to sleep better How many hours per night does patient typically sleep?: 7 hours.  Patient pleased with health care they are receiving?: Yes Family, occupational, and living circumstances relevant to overall health?: Mother (Deceased)  Hyperlipidemia  Hypertension   Father (Deceased)  Sister (Deceased)  Heart disease   Brother Associate Professor)  Cancer prostate Name and location of Current pharmacy: [facility] ([place]), [place] (Ph: [phone]) Current Rx insurance plan: UHC Are meds synced by current pharmacy?: No Are meds delivered by  current pharmacy?: No - delivery available but patient prefers to not use Would patient benefit from direct intervention of clinical lead in dispensing process to optimize clinical outcomes?: Yes Are [facility] pharmacy services available where patient lives?: Yes Is patient disadvantaged to use [facility]?: No [facility] services reviewed with patient and patient wishes to change pharmacy?: No Select reason patient declined to change pharmacies: Other Details: Daughter handles patients medications Does patient experience delays in picking up medications due to transportation concerns (getting to pharmacy)?: No Any additional demeanor/mood notes?: Kathe is a friendly 87 year old woman who completes the initial CCM phone call with her daughter [name]. She traveled to [place] to spend a few days with her daughter. She complains of feeling sluggish, tired on some days due to lack of sufficient sleep.  Hypertension (HTN) Assess this condition today?: Yes Is patient able to obtain BP reading today?: Yes BP today is: 143/71 Heart Rate is: 55 Goal: <140/90 mmHG Hypertension Stage: Stage 2 (SBP >140 or DBP > 90) Is Patient checking BP at home?: Yes Pt. home BP readings are ranging: 157/67, 127/62, 154/78 167/71  How often does patient miss taking their blood pressure medications?: Never Has patient experienced hypotension, dizziness, falls or bradycardia?: No Check present secondary causes (below) for HTN: CKD BP RPM device: Does patient qualify?: Yes We discussed: Reducing the amount of salt intake to <1531m/per day., Targeting 150  minutes of aerobic activity per week, Recommend using a salt substitute to replace your salt if you need flavor., Proper Home BP Measurement Assessment:: Controlled Drug: Diltiazem 180 mg QD Assessment: Appropriate, Effective, Safe, Accessible Drug: Furosemide 70m QD Assessment: Appropriate, Effective, Safe, Accessible HC Follow up: BP assessment in  January. Please assess the blood pressure for the last 2 weeks.  Pharmacist Follow up: Review BP logs, CMP  Hyperlipidemia/Dyslipidemia (HLD) Last Lipid panel on: 08/12/2021 TC (Goal<200): 192 LDL: 105 HDL (Goal>40): 70 TG (Goal<150): 81 ASCVD 10-year risk?is:: N/A due to Age > 79 Assess this condition today?: Yes LDL Goal: <70 Has patient tried and failed any HLD Medications?: No Check present secondary causes (below) that can lead to increased cholesterol levels (multi-choice optional): CKD, Diabetes We discussed: How to reduce cholesterol through diet/weight management and physical activity., Encouraged increasing fiber to a daily intake of 10-25g/day, How a diet high in plant sterols (fruits/vegetables/nuts/whole grains/legumes) may reduce your cholesterol. Assessment:: Uncontrolled HC Follow up: N/A Pharmacist Follow up: Follow up on lipid panel  Diabetes (DM) Current A1C: 5.6 taken on: 08/12/2021 Previous A1C: 7.1 taken on: 03/04/2021 Type: 2 The current microalbumin ratio is: 17 tested on: 08/12/2021 Last DM Eye Exam on: 10/29/2015 Assess this condition today?: Yes Goal A1C: < 7.0 % Type: 2 Is Patient taking statin medication?: No Reason patient is not taking statin(s): Age Is patient taking ACEi / ARB?: No Reason patient is not taking ACEi / ARB: Side effects DM RPM device: Does patient qualify?: Yes Patient checking Blood Sugar at home?: No Has patient experienced any hypoglycemic episodes?: No Diet recall discussed: No We discussed: How to recognize and treat signs of hypoglycemia., Modifying lifestyle, including to participate in moderate physical activity (e.g., walking) at least 150 minutes per week. Assessment:: Controlled HC Follow up: N/A Pharmacist Follow up: Continue to monitor A1c, FBG  Chronic kidney disease (CKD) Previous GFR: 34 taken on: 08/12/2021 The current microalbumin ratio is: 17 tested on: 08/12/2021 Assess this condition today?: Yes CKD  Stage: Stage 3b (GFR 30-44 mL/min) Albuminuria Stage: A1 (<30) Contributing factors for developing CKD: Diabetes, HTN Is Patient taking statin medication: No Reason patient is not taking statin(s): Age Is patient taking ACEi / ARB?: No Reason patient is not taking ACEi / ARB: Side effects Renal dose adjustments recommended?: No We discussed: Limiting dietary sodium intake to less than 2000 mg / day, Maintaining blood pressure control, Maintaining blood glucose control Assessment:: Controlled Drug: None Assessment: Appropriate, Effective, Safe, Accessible HC Follow up: N/A Pharmacist Follow up: Continue to monitor GFR, SCr, and adjust medications as necessary  Osteopenia or Osteoporosis Current T-score: -2.2 taken on: 03/02/2021 Previous T-score: -1.6 taken on: 03/09/2018 Current Vitamin D 25-OH: 102 taken on: 03/04/2021 Previous Vitamin D 25-OH: 38 taken on: 02/27/2020 Assess this condition today?: Yes Patient has: Osteopenia In the past 12 months, have you fallen?: No Are there any stairs in or around the home?: No Is the home free of loose throw rugs in walkways, pet beds, electrical cords, etc.?: No Is there adequate lighting in your home to reduce the risk of falls?: Yes Dietary calcium intake: Milk, fortified cereal We discussed: Weight bearing exercises (walking, light weights, resistance training), Dietary calcium intake, Fall prevention Assessment:: Controlled Drug: Vitamin D3 1000 units QD Assessment: Appropriate, Effective, Safe, Accessible Additional Info: Recommended that patient start Calcium Citrate Supplementation. Pt has tried before and it caused a stomach ache. Patient will try to increase dietary calcium supplementation HC Follow up: N/A Pharmacist  Follow up: Continue to monitor VitD and Ca+ levels, monitor for falls.  Diastolic Heart Failure / HFpEF Last Echo with EF: 55% taken on: 01/24/2021 Assess this condition today?: Yes Reviewed last eGFR/SCr:  Yes Reviewed last Potassium lab: Yes NYHA Class: III (marked limitation of activity) ACC/AHA stage: C (Heart disease and symptoms present) Patient checking daily weights?: Yes What is patient's recent weights?: 133 Has weight increased more than 3 lbs. in one day or more than 5 lbs. in a week?: No RPM Scale: Does patient qualify?: Yes We discussed: Sodium restriction, Avoidance of potassium supplements, Daily weight monitoring Assessment:: Controlled Drug: Lasix 65m QD Assessment: Appropriate, Effective, Safe, Accessible HC Follow up: N/A Pharmacist Follow up: Continue to monitor weights, monitor for exacerbations, continue to exercise as tolerated  Exercise, Diet and Non-Drug Coordination Needs Additional exercise counseling points. We discussed: targeting at least 150 minutes per week of moderate-intensity aerobic exercise., decreasing sedentary behavior, incorporating flexibility, balance, and strength training exercises Additional diet counseling points. We discussed: aiming to consume at least 8 cups of water day, key components of the DASH diet Discussed Non-Drug Care Coordination Needs: Yes Does Patient have Medication financial barriers?: No  Accountable Health Communities Health-Related Social Needs Screening Tool -  SDOH  ([email or web address] What is your living situation today? (ref #1): I have a steady place to live Think about the place you live. Do you have problems with any of the following? (ref #2): None of the above Within the past 12 months, you worried that your food would run out before you got money to buy more (ref #3): Never true Within the past 12 months, the food you bought just didn't last and you didn't have money to get more (ref #4): Never true In the past 12 months, has lack of reliable transportation kept you from medical appointments, meetings, work or from getting things needed for daily living? (ref  #5): No In the past 12 months, has the electric, gas, oil, or water company threatened to shut off services in your home? (ref #6): No How often does anyone, including family and friends, physically hurt you? (ref #7): Never (1) How often does anyone, including family and friends, insult or talk down to you? (ref #8): Never (1) How often does anyone, including friends and family, threaten you with harm? (ref #9): Never (1) How often does anyone, including family and friends, scream or curse at you? (ref #10): Never (1)  Engagement Notes ANewton Piggon 10/24/2021 09:56 AM CPP Chart Review: 30 min  CPP Office Visit: 46 min  CPP Office Visit Documentation: 73 min  CPP [facility]ination of Care:  [facility] Completion:  CPP Care Plan Review: 22 min Cost Analysis- 155ms HC Chart Prep: 6063m  Engagement Notes AdeNewton Pigg 10/24/2021 11:07 AM HC F/u:   BP assessment in January. Please assess the blood pressure for the last 2 weeks.   CPP F/u:  03/09/22: OV w/ [place]  03/17/22 '@11' :00am: CCM phone follow-up (BP, sleep, Lipids)  Care Gaps:  COVID Vaccine: Pt will get at local pharmacy  Patient Name:? Iddings,Amanee K DOB:?? 19303-22-1935 Last Care Plan Update:? 10/23/2021 COMPREHENSIVE CARE PLAN AND GOALS?    HYPERTENSION   MOST RECENT BLOOD PRESSURE:      143/71 on 10/23/2021 MY GOAL BLOOD PRESSURE:   <140/90 mmHg CURRENT MEDICATION AND DOSING:   Diltiazem 180 mg daily Furosemide 52m16mily THE GOALS WE HAVE CHOSEN ARE:     -Maintain an at goal blood  pressure   BARRIERS TO ACHIEVING GOALS:   -None, controlled PLAN TO WORK ON THESE GOALS:   -Take medications regularly    -Check BP at home   -Limit Alcohol 1-2 drinks per day   -Reduce salt intake (< 1527m/ day)   -Diet: DASH diet (Choose fruits, vegetables, and low-fat dairy products. Increase whole grains, fish, poultry, nuts. Reduce red meats and sugars)   -Weight: 1 kg = ~1 mmHg reduction   -Exercise  .  CHOLESTEROL    MOST RECENT LABS:     08/12/2021 -TOTAL CHOLESTEROL: 192  -TRIGLYCERIDES: 81  -HDL: 70  -LDL: 105  CURRENT MEDICATION AND DOSING:   None  THE GOALS WE HAVE CHOSEN ARE:     -Total Cholesterol goal under 200, Triglycerides goal under 150, HDL goal above 40, LDL goal under 100   BARRIERS TO ACHIEVING GOALS:   -Diet PLAN TO WORK ON THESE GOALS:   -Diet: high in plant sterols (fruits/ vegetables/ nuts/ whole grains/ legumes). Increase fiber intake (10-25g/day). Avoid foods high in cholesterol (red meat, egg yolks, dairy, oils/ butter). Choose low-fat options.   -Weight Management  .    DIABETES   MOST RECENT A1C:   5.6% on 08/12/2021      MY GOAL A1C:   <7% LAST EYE EXAM:  10/29/2015 CURRENT MEDICATION AND DOSING:  None   THE GOALS WE HAVE CHOSEN ARE:    -Maintain an at goal A1C level   BARRIERS TO ACHIEVING GOALS:   -None, controlled PLAN TO WORK ON THESE GOALS:   -Take medications as prescribed   -Check blood glucose daily    -Diet: natural carbs and sugars (vegetables, fruits, whole grains, legumes, dairy), avoid alcohol, reduce carbohydrate intake and processed sugars. Maintain a low carbohydrate diet, eating 45 grams of carbohydrates per meal (3 servings of carbohydrates per meal), 15 grams of carbohydrates per snack (1 serving of carbohydrate per snack)   -Weight Loss: waist circumference goal < 35 inches for females; < 40 inches for males   -Exercise: 150 minutes of moderate-intensity aerobic activity per week (at least 3 days)     -Foot care: wash, dry examine feet daily. Moisturize the top and bottom (not in between). Trim nails with nail file (no sharp edges). Wear socks and shoes. Elevate feet when sitting. Annual foot exam by podiatrist.     -Recognize sign and symptoms of hypoglycemia and understand treatment as outlined below   -Hypoglycemia sign and symptoms: dizziness, anxiety/ irritability, shakiness, headache, diaphoresis, hunger, confusion, nausea, ataxia,  tremors, palpitations/ tachycardia, blurred vision   -Hypoglycemia Treatment: Rule of 15:    (1) 15-20 grams of glucose/ simple carb (4 oz juice, 8oz milk, 4oz regular soda, 1 tablespoon sugar/ honey/ corn syrup, 3-4 glucose tabs/ 1 serving glucose gel)   (2) Recheck BG after 15 mins. If hypoglycemia continues   (3) Repeat steps 1&2, when BG normalizes, eat small meal or snack      HEART FAILURE   TYPE OF HEART FAILURE:  Diastolic LAST EJECTION FRACTION/DATE:   55% on 01/24/2021 CURRENTLY STAGED AT:    NYHA Class: III (marked limitation of activity) CURRENT MEDICATION AND DOSING:   Lasix 816mdaily THE GOALS WE HAVE CHOSEN ARE:    -Reduce shortness of breath and reduce hospitalizations due to heart failure exacerbations   BARRIERS TO ACHIEVING GOALS:   None, controlled PLAN TO WORK ON THESE GOALS:   -Take medications regularly   -Check weight daily and log   -Maintain low  sodium diet < 1548m/ day   -Activity plan: start slow and stop if feeling chest pain   -Discussed CHF action plan   *GREEN: Take medications as directed, go to your doctor's appointments    *YELLOW: May need diuretic adjustment, call MD or CPP: Weight gain > 3lbs in one day or 3-5 lbs in one week; increased swelling or coughing; increased number of pillows to sleep, shortness of breath with activity   *RED: CALL MD NOW or 911 (if chest pain): weight gain > 5 lbs in 1 week; waking at night SOB; dizzy or falling; SOB at rest, chest tightness or wheezing    Osteopenia CURRENT REGIMEN AND DOSING:  Vitamin D3 1,000 units daily   THE GOALS WE HAVE CHOSEN ARE:   -Reduce fall risk   -Strengthen muscles and bones   PLAN TO WORK ON THESE GOALS:     -Assess home for fall risks, use handrails on stairs and safety bars in bathroom, non-skid mats for the bath tub/ shower   -Exercise: regular weight bearing exercises (walking, jogging) and muscle strengthening (weight training, yoga)  ?   Chronic Kidney Disease   Labs:    Previous GFR: 34 taken on: 08/12/2021 The current microalbumin ratio is: 17 tested on: 08/12/2021  PLAN TO WORK ON THESE GOALS:     -Maintaining normal BP and BG   -Diet: Decrease salt and fatty foods. Increase fruit and vegetables      ACTIVE MEDICATION LIST   Your current medication list has been updated. To view, log in to your patient portal.   Call if any changes need to be made.      MEDICATION REVIEW   MEDICATION REVIEW CONDUCTED:   Yes    DATE:    10/23/2021  BEST POSSIBLE MEDICATION HISTORY  SOURCE:   Medical Records  ??    HOW DO I? - WHEN DO I?     GET AHOLD OF MY DOCTOR?    DURING BUSINESS HOURS WHEN THE OFFICE IS OPEN    PHONE: [phone] AFTER HOURS [facility] NURSE WHEN THE OFFICE IS CLOSED   PHONE: [phone]   TALK TO MSouth MonroeCARE COORDINATOR NAME: ANewton Pigg  PHONE: [phone] EMAIL:  ASeth [email or web address]  CARE COORDINATOR STAFF   NAME: ARosary Lively MMarlene Bast MSandria Bales  PHONE: [phone]   NOTE SECTION   Thank you for participating in the Chronic Care Management (CCM) program with Dr. ANewton Pigg    This program takes a proactive approach to your health and my team will serve as a resource for you throughout the year. Please follow up at [phone] if you have any questions or experience changes to your overall health. Your next CCM appointment will be conducted with ANewton Pigg PharmD as follows:      Date:    03/17/2022 Time:   11:00 am Over the Phone   ? ARachelle HoraAJeannett Senior PharmD  Clinical Pharmacist  Merik [email or web address]  ([phone]

## 2021-10-30 ENCOUNTER — Other Ambulatory Visit: Payer: Self-pay | Admitting: General Practice

## 2021-10-30 NOTE — Telephone Encounter (Signed)
Prescription refill request for Eliquis received. Indication:Afib Last office visit:11/22 Scr:1.4 Age: 87 Weight:63.1 kg  Prescription refilled

## 2021-11-11 ENCOUNTER — Other Ambulatory Visit: Payer: Self-pay

## 2021-11-11 ENCOUNTER — Ambulatory Visit (INDEPENDENT_AMBULATORY_CARE_PROVIDER_SITE_OTHER): Payer: Medicare Other | Admitting: Nurse Practitioner

## 2021-11-11 ENCOUNTER — Encounter: Payer: Self-pay | Admitting: Nurse Practitioner

## 2021-11-11 VITALS — BP 150/76 | HR 65 | Temp 97.7°F | Wt 137.6 lb

## 2021-11-11 DIAGNOSIS — N3000 Acute cystitis without hematuria: Secondary | ICD-10-CM | POA: Diagnosis not present

## 2021-11-11 MED ORDER — NITROFURANTOIN MONOHYD MACRO 100 MG PO CAPS: 100.0000 mg | ORAL_CAPSULE | Freq: Two times a day (BID) | ORAL | 0 refills | Status: AC

## 2021-11-11 NOTE — Progress Notes (Signed)
Assessment and Plan: Vernette was seen today for acute visit.  Diagnoses and all orders for this visit:  Acute cystitis without hematuria -     nitrofurantoin, macrocrystal-monohydrate, (MACROBID) 100 MG capsule; Take 1 capsule (100 mg total) by mouth 2 (two) times daily for 7 days. -     Urinalysis, Routine w reflex microscopic -     Urine Culture Push fluids, encouraged water with lemon to increase acidity of urine. If she develops fever or confusion worsens she is to go to the ER.       Further disposition pending results of labs. Discussed med's effects and SE's.   Over 30 minutes of exam, counseling, chart review, and critical decision making was performed.   Future Appointments  Date Time Provider [place]  03/09/2022  3:00 PM Unk Pinto, MD [facility] None  03/17/2022 11:00 AM Newton Pigg, RPH [facility] None  08/13/2022  4:00 PM Magda Bernheim, NP [facility] None    ------------------------------------------------------------------------------------------------------------------   HPI BP (!) 150/76    Pulse 65    Temp 97.7 F (36.5 C)    Wt 137 lb 9.6 oz (62.4 kg)    SpO2 97%    BMI 26.00 kg/m  87 y.o.female presents for lower back pain , no dysuria but urinary frequency.  Memory seems to be worse- forgetting things.  Has been going on for approximately 2 weeks. She does have a history of previous UTI- one which developed into pyelonephritis which required hospitalization.   Past Medical History:  Diagnosis Date   Acute on chronic diastolic (congestive) heart failure ([place]) 11/21/2018   Cancer ([place])    skin cancer   DDD (degenerative disc disease)    Diverticula of colon    Diverticulitis    DJD (degenerative joint disease)    GERD (gastroesophageal reflux disease)    takes ranitidine   Heart murmur    History of hiatal hernia    History of pneumonia    Hx of irritable bowel syndrome    Hyperlipidemia    Hypertension    Occasional tremors     Osteoporosis    Pre-diabetes    Varicose veins    Vitamin D deficiency      Allergies  Allergen Reactions   Accupril [Quinapril Hcl] Cough   Neosporin [Neomycin-Bacitracin Zn-Polymyx]     unknown   Prednisone     Agitated and shaky   Reglan [Metoclopramide] Other (See Comments)    tremors   Tramadol Nausea And Vomiting   Adhesive [Tape] Rash    Current Outpatient Medications on File Prior to Visit  Medication Sig   acetaminophen (TYLENOL) 500 MG tablet Take 500 mg by mouth every 6 (six) hours as needed for moderate pain.   amiodarone (PACERONE) 200 MG tablet Take 1 tablet (200 mg total) by mouth daily.   apixaban (ELIQUIS) 2.5 MG TABS tablet Take 1 tablet by mouth twice daily   cholecalciferol (VITAMIN D3) 25 MCG (1000 UNIT) tablet Take 1,000 Units by mouth daily.   Cyanocobalamin (B-12 SL) Place 1 tablet under the tongue daily.   diazepam (VALIUM) 2 MG tablet Take  1 tablet  4 x /day  for Tremor          / TAKE 1 TABLET BY MOUTH 3- 4 TIMES DAILY WITH MEALS FOR TREMOR (Patient taking differently: Take 2 mg by mouth as needed. Take  1 tablet  4 x /day  for Tremor          / TAKE  1 TABLET BY MOUTH 3- 4 TIMES DAILY WITH MEALS FOR TREMOR)   diltiazem (CARDIZEM CD) 180 MG 24 hr capsule Take  1 capsule  Daily  for Heart Rhythm & BP                         /          Take 1 capsule by mouth once daily   furosemide (LASIX) 80 MG tablet Take 1 tablet (80 mg total) by mouth 2 (two) times daily. (Patient taking differently: Take 80 mg by mouth daily.)   Melatonin 10 MG TABS Take 1 tablet by mouth daily.   polyethylene glycol (MIRALAX / GLYCOLAX) 17 g packet Take 17 g by mouth daily.   potassium chloride SA (KLOR-CON) 20 MEQ tablet Take 1 tablet (20 mEq total) by mouth 2 (two) times daily. (Patient taking differently: Take 10 mEq by mouth daily.)   PROAIR HFA 108 (90 Base) MCG/ACT inhaler Inhale 2 puffs into the lungs every 6 (six) hours as needed for wheezing or shortness of breath.    Pyridoxine HCl (VITAMIN B-6 PO) Take 1 capsule by mouth daily.   vitamin C (ASCORBIC ACID) 500 MG tablet Take 500 mg by mouth daily.   zinc gluconate 50 MG tablet Take 50 mg by mouth daily.   Carboxymeth-Glycerin-Polysorb (REFRESH DIGITAL OP) Place 1 drop into the right eye daily as needed (dry eyes). (Patient not taking: Reported on 11/11/2021)   No current facility-administered medications on file prior to visit.    ROS: all negative except above.   Physical Exam:  BP (!) 150/76    Pulse 65    Temp 97.7 F (36.5 C)    Wt 137 lb 9.6 oz (62.4 kg)    SpO2 97%    BMI 26.00 kg/m   General Appearance: Well nourished, in no apparent distress. Eyes: PERRLA, EOMs, conjunctiva no swelling or erythema Sinuses: No Frontal/maxillary tenderness ENT/Mouth: Ext aud canals clear, TMs without erythema, bulging. No erythema, swelling, or exudate on post pharynx.  Tonsils not swollen or erythematous. Hearing normal.  Neck: Supple, thyroid normal.  Respiratory: Respiratory effort normal, BS equal bilaterally without rales, rhonchi, wheezing or stridor.  Cardio: RRR with no MRGs. Brisk peripheral pulses without edema.  Abdomen: Soft, + BS.  Non tender, no guarding, rebound, hernias, masses. Lymphatics: Non tender without lymphadenopathy.  Musculoskeletal: Full ROM, 5/5 strength, normal gait. No CVA tenderness Skin: Warm, dry without rashes, lesions, ecchymosis.  Neuro: Cranial nerves intact. Normal muscle tone, no cerebellar symptoms. Sensation intact.  Psych: Awake and oriented X 3, normal affect, Insight and Judgment appropriate.     Magda Bernheim, NP 10:36 AM [facility]

## 2021-11-12 LAB — URINALYSIS, ROUTINE W REFLEX MICROSCOPIC
Bilirubin Urine: NEGATIVE
Glucose, UA: NEGATIVE
Hgb urine dipstick: NEGATIVE
Ketones, ur: NEGATIVE
Nitrite: NEGATIVE
Specific Gravity, Urine: 1.014 (ref 1.001–1.035)
pH: 6.5 (ref 5.0–8.0)

## 2021-11-12 LAB — MICROSCOPIC MESSAGE

## 2021-11-12 LAB — URINE CULTURE
MICRO NUMBER:: 12800768
SPECIMEN QUALITY:: ADEQUATE

## 2021-11-13 DIAGNOSIS — E1122 Type 2 diabetes mellitus with diabetic chronic kidney disease: Secondary | ICD-10-CM

## 2021-11-13 DIAGNOSIS — E785 Hyperlipidemia, unspecified: Secondary | ICD-10-CM

## 2021-11-13 DIAGNOSIS — E1169 Type 2 diabetes mellitus with other specified complication: Secondary | ICD-10-CM

## 2021-11-13 DIAGNOSIS — I10 Essential (primary) hypertension: Secondary | ICD-10-CM

## 2021-11-24 DIAGNOSIS — H40021 Open angle with borderline findings, high risk, right eye: Secondary | ICD-10-CM | POA: Diagnosis not present

## 2021-11-24 DIAGNOSIS — E119 Type 2 diabetes mellitus without complications: Secondary | ICD-10-CM | POA: Diagnosis not present

## 2021-11-24 DIAGNOSIS — H353131 Nonexudative age-related macular degeneration, bilateral, early dry stage: Secondary | ICD-10-CM | POA: Diagnosis not present

## 2021-11-24 DIAGNOSIS — H0102A Squamous blepharitis right eye, upper and lower eyelids: Secondary | ICD-10-CM | POA: Diagnosis not present

## 2021-11-24 DIAGNOSIS — Z961 Presence of intraocular lens: Secondary | ICD-10-CM | POA: Diagnosis not present

## 2021-11-24 DIAGNOSIS — H0102B Squamous blepharitis left eye, upper and lower eyelids: Secondary | ICD-10-CM | POA: Diagnosis not present

## 2021-11-24 DIAGNOSIS — H40011 Open angle with borderline findings, low risk, right eye: Secondary | ICD-10-CM | POA: Diagnosis not present

## 2021-11-24 DIAGNOSIS — H04123 Dry eye syndrome of bilateral lacrimal glands: Secondary | ICD-10-CM | POA: Diagnosis not present

## 2021-11-24 DIAGNOSIS — H401122 Primary open-angle glaucoma, left eye, moderate stage: Secondary | ICD-10-CM | POA: Diagnosis not present

## 2021-11-24 DIAGNOSIS — H43811 Vitreous degeneration, right eye: Secondary | ICD-10-CM | POA: Diagnosis not present

## 2021-12-05 DIAGNOSIS — L570 Actinic keratosis: Secondary | ICD-10-CM | POA: Diagnosis not present

## 2022-01-08 DIAGNOSIS — H6122 Impacted cerumen, left ear: Secondary | ICD-10-CM | POA: Diagnosis not present

## 2022-01-09 DIAGNOSIS — L570 Actinic keratosis: Secondary | ICD-10-CM | POA: Diagnosis not present

## 2022-01-29 DIAGNOSIS — H903 Sensorineural hearing loss, bilateral: Secondary | ICD-10-CM | POA: Diagnosis not present

## 2022-02-06 DIAGNOSIS — Z85828 Personal history of other malignant neoplasm of skin: Secondary | ICD-10-CM | POA: Diagnosis not present

## 2022-02-06 DIAGNOSIS — L821 Other seborrheic keratosis: Secondary | ICD-10-CM | POA: Diagnosis not present

## 2022-02-06 DIAGNOSIS — B351 Tinea unguium: Secondary | ICD-10-CM | POA: Diagnosis not present

## 2022-02-06 DIAGNOSIS — L814 Other melanin hyperpigmentation: Secondary | ICD-10-CM | POA: Diagnosis not present

## 2022-02-06 DIAGNOSIS — L570 Actinic keratosis: Secondary | ICD-10-CM | POA: Diagnosis not present

## 2022-02-09 ENCOUNTER — Other Ambulatory Visit: Payer: Self-pay | Admitting: Internal Medicine

## 2022-02-09 DIAGNOSIS — Z1231 Encounter for screening mammogram for malignant neoplasm of breast: Secondary | ICD-10-CM

## 2022-02-14 ENCOUNTER — Other Ambulatory Visit: Payer: Self-pay | Admitting: Cardiovascular Disease

## 2022-02-25 DIAGNOSIS — N1832 Chronic kidney disease, stage 3b: Secondary | ICD-10-CM | POA: Diagnosis not present

## 2022-03-04 ENCOUNTER — Ambulatory Visit: Payer: Medicare Other

## 2022-03-04 DIAGNOSIS — I129 Hypertensive chronic kidney disease with stage 1 through stage 4 chronic kidney disease, or unspecified chronic kidney disease: Secondary | ICD-10-CM | POA: Diagnosis not present

## 2022-03-04 DIAGNOSIS — I480 Paroxysmal atrial fibrillation: Secondary | ICD-10-CM | POA: Diagnosis not present

## 2022-03-04 DIAGNOSIS — E1122 Type 2 diabetes mellitus with diabetic chronic kidney disease: Secondary | ICD-10-CM | POA: Diagnosis not present

## 2022-03-04 DIAGNOSIS — N1832 Chronic kidney disease, stage 3b: Secondary | ICD-10-CM | POA: Diagnosis not present

## 2022-03-04 DIAGNOSIS — E876 Hypokalemia: Secondary | ICD-10-CM | POA: Diagnosis not present

## 2022-03-04 DIAGNOSIS — I5032 Chronic diastolic (congestive) heart failure: Secondary | ICD-10-CM | POA: Diagnosis not present

## 2022-03-04 DIAGNOSIS — N2581 Secondary hyperparathyroidism of renal origin: Secondary | ICD-10-CM | POA: Diagnosis not present

## 2022-03-08 ENCOUNTER — Encounter: Payer: Self-pay | Admitting: Internal Medicine

## 2022-03-08 NOTE — Patient Instructions (Signed)

## 2022-03-08 NOTE — Progress Notes (Signed)
? ?Annual Screening/Preventative Visit ?& Comprehensive Evaluation &  Examination ? ?Future Appointments  ?Date Time Provider Department  ?03/09/2022              CPE  3:00 PM Unk Pinto, MD [facility]  ?03/17/2022 11:00 AM Carleene Mains, RPH [facility]  ?03/24/2022 10:40 AM [facility] 2 [facility]  ?03/24/2022  1:30 PM Marilynn Rail, Jossie Ng, NP [place]  ?08/13/2022              Wellness   4:00 PM Magda Bernheim, NP [facility]  ?03/11/2023              CPE  3:00 PM Unk Pinto, MD [facility]  ? ? ?    This very nice 88 y.o.  WWF presents for a Screening /Preventative Visit & comprehensive evaluation and management of multiple medical co-morbidities.  Patient has been followed for HTN, ASHD/cAfib, HLD, T2_NIDDM  and Vitamin D Deficiency.  CTA Chest in Jan 2020 showed Aortic Atherosclerosis. Patient was hospitalized in Apr 2022 due GI bleed due to severe Sigmoid /Desc. colon Diverticular Dz  (on Eliquis).  ? ? ?    About 1 week ago, patient apparently tripped and fell sustaining a large hematoma  over her lateral Right thigh. She denies and difficulty with balance or gait.  ? ? ?     HTN predates since  1996.  In Jan 2020, patient was dx'd with cAfib & Diastolic Ht failure & failed CV x 2.  In Mar 2022, she was hospitalized with CHF & AKI on CKD.  Patient has  CKD3b (GFR 42) by recent labs at Nephrologist Dr Avis Epley office.  Patient's BP has been controlled at home and patient denies any cardiac symptoms as chest pain, palpitations, shortness of breath, dizziness or ankle swelling. Today's BP is at goal -  130/68.  ? ? ?    Patient's hyperlipidemia is not controlled with diet and medications. Patient denies myalgias or other medication SE's. Last lipids were near goal : ? ?Lab Results  ?Component Value Date  ? CHOL 192 08/13/2021  ? HDL 70 08/13/2021  ? LDLCALC 105 (H) 08/13/2021  ? TRIG 81 08/13/2021  ? CHOLHDL 2.7 08/13/2021  ? ? ? ?    Patient has hx/o T2_NIDDM  (A1c 6.6% /2011) w/CKD3b (GFR  42) and she has been  attempting control with diet.  Patient denies reactive hypoglycemic symptoms, visual blurring, diabetic polys or paresthesias. Last A1c was normal & at goal : ? ?Lab Results  ?Component Value Date  ? HGBA1C 5.6 08/13/2021  ? ? ? ?    Finally, patient has history of Vitamin D Deficiency (23" / 2008) and last Vitamin D was at goal : ? ?Lab Results  ?Component Value Date  ? VD25OH 102 (H) 03/05/2021  ? ? ? ?Current Outpatient Medications on File Prior to Visit  ?Medication Sig  ? acetaminophen 500 MG tablet Take every 6 (six) hours as needed for moderate pain.  ? amiodarone (PACERONE) 200 MG tablet Take 1 tablet once daily  ? apixaban (ELIQUIS) 2.5 MG TABS tablet Take 1 tablet wice daily  ? VITAMIN D 1,000 Units   Take daily.  ? Cyanocobalamin (B-12 SL) Place 1 tablet under the tongue daily.  ? diazepam 2 MG tablet Take  1 tablet  4 x /day  for Tremor    ? diltiazem  CD) 180 MG  Take  1 capsule  Daily    ? furosemide  80 MG tablet Take  1 tablet twice daily  ? Melatonin 10 MG TABS Take 1 tablet daily.  ? polyethylene glycol 17 g packet Take 1daily.  ? potassium chloride 20 MEQ tablet Take 10 mEq daily  ? PROAIR HFA inhaler Inhale 2 puffs  every 6 hours as needed  ? Pyridoxine HCl (VITAMIN B-6) Take 1 capsule  daily.  ? vitamin C  500 MG tablet Take  daily.  ? zinc 50 MG tablet Take daily.  ? ? ? ?Allergies  ?Allergen Reactions  ? Accupril [Quinapril Hcl] Cough  ? Neosporin    ? Prednisone Agitated and shaky  ? Reglan [Metoclopramide] tremors  ? Tramadol Nausea And Vomiting  ? Adhesive [Tape] Rash  ? ? ? ?Past Medical History:  ?Diagnosis Date  ? Acute on chronic diastolic (congestive) heart failure ([place]) 11/21/2018  ? Cancer [facility])   ? skin cancer  ? DDD (degenerative disc disease)   ? Diverticula of colon   ? Diverticulitis   ? DJD (degenerative joint disease)   ? GERD (gastroesophageal reflux disease)   ? takes ranitidine  ? Heart murmur   ? History of hiatal hernia   ? History of pneumonia   ? Hx of irritable bowel  syndrome   ? Hyperlipidemia   ? Hypertension   ? Occasional tremors   ? Osteoporosis   ? Pre-diabetes   ? Varicose veins   ? Vitamin D deficiency   ? ? ? ?Health Maintenance  ?Topic Date Due  ? COVID-19 Vaccine (1) Never done  ? Zoster Vaccines- Shingrix (1 of 2) Never done  ? HEMOGLOBIN A1C  02/10/2022  ? FOOT EXAM  03/04/2022  ? OPHTHALMOLOGY EXAM  03/05/2022  ? INFLUENZA VACCINE  06/16/2022  ? URINE MICROALBUMIN  08/13/2022  ? TETANUS/TDAP  09/04/2025  ? Pneumonia Vaccine 43+ Years old  Completed  ? DEXA SCAN  Completed  ? HPV VACCINES  Aged Out  ? ? ? ?Immunization History  ?Administered Date(s) Administered  ? DT  09/05/2015  ? DTaP 11/16/2004  ? Influenza Whole 08/30/2013  ? Influenza, High Dose  08/10/2016, 11/20/2019, 09/10/2020  ? Pneumococcal -13 10/19/2016  ? Pneumococcal -23 11/16/2001  ? Zoster, Live 06/01/2013  ? ? ?Last Colon - 01/21/2010 - Dr Fuller Plan - recc 10 yr f/u Mar 2022 - Aged out ?Flex Sig  03/08/2021   -  Severe Diverticular Dz.  ? ?Last MGM - 03/04/2021 & scheduled 03/24/2022 ? ? ?Past Surgical History:  ?Procedure Laterality Date  ? APPENDECTOMY    ? BREAST EXCISIONAL BIOPSY Right   ? BREAST SURGERY Right   ? fluid drained from breast  ? CARDIOVERSION N/A 12/20/2018  ? Procedure: CARDIOVERSION;  Surgeon: Buford Dresser, MD;  Location: [place];  Service: Cardiovascular;  Laterality: N/A;  ? CARDIOVERSION N/A 06/16/2019  ? Procedure: CARDIOVERSION;  Surgeon: Fay Records, MD;  Location: [place];  Service: Cardiovascular;  Laterality: N/A;  ? COLONOSCOPY    ? ESOPHAGOGASTRODUODENOSCOPY    ? FLEXIBLE SIGMOIDOSCOPY N/A 03/09/2021  ? Procedure: FLEXIBLE SIGMOIDOSCOPY;  Surgeon: Mauri Pole, MD;  Location: [place] [facility];  Service: Endoscopy;  Laterality: N/A;  ? HEMOSTASIS CLIP PLACEMENT  03/09/2021  ? Procedure: HEMOSTASIS CLIP PLACEMENT;  Surgeon: Mauri Pole, MD;  Location: [place] [facility];  Service: Endoscopy;;  ? JOINT REPLACEMENT Left   ? left hip  ? LUMBAR  LAMINECTOMY/DECOMPRESSION MICRODISCECTOMY N/A 06/05/2015  ? Procedure: L3-L5 DECOMPRESSION ;  Surgeon: Melina Schools, MD;  Location: [place];  Service: Orthopedics;  Laterality: N/A;  ?  OPEN REDUCTION INTERNAL FIXATION (ORIF) DISTAL RADIAL FRACTURE Right 01/10/2014  ? Procedure: OPEN REDUCTION INTERNAL FIXATION (ORIF) DISTAL RADIAL FRACTURE;  Surgeon: Linna Hoff, MD;  Location: [place];  Service: Orthopedics;  Laterality: Right;  ? SPINAL CORD DECOMPRESSION  06/05/2015  ? L3 L 5  ? THYROID SURGERY    ? TONSILLECTOMY    ? TUBAL LIGATION    ? varicose veins stripped    ? WRIST FRACTURE SURGERY Bilateral   ? ? ? ?Family History  ?Problem Relation Age of Onset  ? Hypertension Mother   ? Hyperlipidemia Mother   ? Heart disease Sister   ? Cancer Brother   ?     prostate  ? Colon cancer Neg Hx   ? Esophageal cancer Neg Hx   ? Pancreatic cancer Neg Hx   ? Stomach cancer Neg Hx   ? Liver disease Neg Hx   ? ? ? ?Social History  ? ?Tobacco Use  ? Smoking status: Never  ? Smokeless tobacco: Never  ?Substance Use Topics  ? Alcohol use: No  ? Drug use: No  ? ? ? ? ROS ?Constitutional: Denies fever, chills, weight loss/gain, headaches, insomnia,  night sweats, and change in appetite. Does c/o fatigue. ?Eyes: Denies redness, blurred vision, diplopia, discharge, itchy, watery eyes.  ?ENT: Denies discharge, congestion, post nasal drip, epistaxis, sore throat, earache, hearing loss, dental pain, Tinnitus, Vertigo, Sinus pain, snoring.  ?Cardio: Denies chest pain, palpitations, irregular heartbeat, syncope, dyspnea, diaphoresis, orthopnea, PND, claudication, edema ?Respiratory: denies cough, dyspnea, DOE, pleurisy, hoarseness, laryngitis, wheezing.  ?Gastrointestinal: Denies dysphagia, heartburn, reflux, water brash, pain, cramps, nausea, vomiting, bloating, diarrhea, constipation, hematemesis, melena, hematochezia, jaundice, hemorrhoids ?Genitourinary: Denies dysuria, frequency, urgency, nocturia, hesitancy, discharge, hematuria, flank  pain ?Breast: Breast lumps, nipple discharge, bleeding.  ?Musculoskeletal: Denies arthralgia, myalgia, stiffness, Jt. Swelling, pain, limp, and strain/sprain. Denies falls. ?Skin: Denies puritis, rash, hives, wart

## 2022-03-09 ENCOUNTER — Encounter: Payer: Self-pay | Admitting: Internal Medicine

## 2022-03-09 ENCOUNTER — Ambulatory Visit (INDEPENDENT_AMBULATORY_CARE_PROVIDER_SITE_OTHER): Payer: Medicare Other | Admitting: Internal Medicine

## 2022-03-09 VITALS — BP 130/68 | HR 56 | Temp 97.7°F | Ht 61.0 in | Wt 139.4 lb

## 2022-03-09 DIAGNOSIS — E1122 Type 2 diabetes mellitus with diabetic chronic kidney disease: Secondary | ICD-10-CM | POA: Diagnosis not present

## 2022-03-09 DIAGNOSIS — E1169 Type 2 diabetes mellitus with other specified complication: Secondary | ICD-10-CM

## 2022-03-09 DIAGNOSIS — Z0000 Encounter for general adult medical examination without abnormal findings: Secondary | ICD-10-CM

## 2022-03-09 DIAGNOSIS — I482 Chronic atrial fibrillation, unspecified: Secondary | ICD-10-CM

## 2022-03-09 DIAGNOSIS — I10 Essential (primary) hypertension: Secondary | ICD-10-CM | POA: Diagnosis not present

## 2022-03-09 DIAGNOSIS — I700 Atherosclerosis of aorta: Secondary | ICD-10-CM

## 2022-03-09 DIAGNOSIS — E785 Hyperlipidemia, unspecified: Secondary | ICD-10-CM | POA: Diagnosis not present

## 2022-03-09 DIAGNOSIS — I5032 Chronic diastolic (congestive) heart failure: Secondary | ICD-10-CM

## 2022-03-09 DIAGNOSIS — N2581 Secondary hyperparathyroidism of renal origin: Secondary | ICD-10-CM

## 2022-03-09 DIAGNOSIS — Z136 Encounter for screening for cardiovascular disorders: Secondary | ICD-10-CM | POA: Diagnosis not present

## 2022-03-09 DIAGNOSIS — D6869 Other thrombophilia: Secondary | ICD-10-CM

## 2022-03-09 DIAGNOSIS — E559 Vitamin D deficiency, unspecified: Secondary | ICD-10-CM

## 2022-03-09 DIAGNOSIS — N1831 Chronic kidney disease, stage 3a: Secondary | ICD-10-CM

## 2022-03-09 DIAGNOSIS — Z1211 Encounter for screening for malignant neoplasm of colon: Secondary | ICD-10-CM

## 2022-03-09 DIAGNOSIS — Z79899 Other long term (current) drug therapy: Secondary | ICD-10-CM

## 2022-03-09 DIAGNOSIS — Z0001 Encounter for general adult medical examination with abnormal findings: Secondary | ICD-10-CM

## 2022-03-09 MED ORDER — ALBUTEROL SULFATE HFA 108 (90 BASE) MCG/ACT IN AERS: INHALATION_SPRAY | RESPIRATORY_TRACT | 3 refills | Status: AC

## 2022-03-10 LAB — LIPID PANEL
Cholesterol: 183 mg/dL (ref <200)
HDL: 78 mg/dL (ref >=50)
LDL Cholesterol (Calc): 90 mg/dL (calc)
Non-HDL Cholesterol (Calc): 105 mg/dL (calc) (ref <130)
Total CHOL/HDL Ratio: 2.3 (calc) (ref <5.0)
Triglycerides: 68 mg/dL (ref <150)

## 2022-03-10 LAB — HEMOGLOBIN A1C
Hgb A1c MFr Bld: 6.2 % of total Hgb — ABNORMAL HIGH (ref <5.7)
Mean Plasma Glucose: 131 mg/dL
eAG (mmol/L): 7.3 mmol/L

## 2022-03-10 LAB — URINALYSIS, ROUTINE W REFLEX MICROSCOPIC
Bacteria, UA: NONE SEEN /HPF
Bilirubin Urine: NEGATIVE
Glucose, UA: NEGATIVE
Hgb urine dipstick: NEGATIVE
Hyaline Cast: NONE SEEN /LPF
Ketones, ur: NEGATIVE
Nitrite: NEGATIVE
Protein, ur: NEGATIVE
RBC / HPF: NONE SEEN /HPF (ref 0–2)
Specific Gravity, Urine: 1.008 (ref 1.001–1.035)
Squamous Epithelial / HPF: NONE SEEN /HPF (ref <=5)
pH: 5.5 (ref 5.0–8.0)

## 2022-03-10 LAB — CBC WITH DIFFERENTIAL/PLATELET
Absolute Monocytes: 817 cells/uL (ref 200–950)
Basophils Absolute: 28 cells/uL (ref 0–200)
Basophils Relative: 0.4 %
Eosinophils Absolute: 78 cells/uL (ref 15–500)
Eosinophils Relative: 1.1 %
HCT: 35 % (ref 35.0–45.0)
Hemoglobin: 11.8 g/dL (ref 11.7–15.5)
Lymphs Abs: 831 cells/uL — ABNORMAL LOW (ref 850–3900)
MCH: 31.6 pg (ref 27.0–33.0)
MCHC: 33.7 g/dL (ref 32.0–36.0)
MCV: 93.6 fL (ref 80.0–100.0)
MPV: 11.5 fL (ref 7.5–12.5)
Monocytes Relative: 11.5 %
Neutro Abs: 5346 cells/uL (ref 1500–7800)
Neutrophils Relative %: 75.3 %
Platelets: 204 10*3/uL (ref 140–400)
RBC: 3.74 10*6/uL — ABNORMAL LOW (ref 3.80–5.10)
RDW: 12.6 % (ref 11.0–15.0)
Total Lymphocyte: 11.7 %
WBC: 7.1 10*3/uL (ref 3.8–10.8)

## 2022-03-10 LAB — TSH: TSH: 1.14 mIU/L (ref 0.40–4.50)

## 2022-03-10 LAB — INSULIN, RANDOM: Insulin: 5.9 u[IU]/mL

## 2022-03-10 NOTE — Progress Notes (Signed)
<><><><><><><><><><><><><><><><><><><><><><><><><><><><><><><><><> ?<><><><><><><><><><><><><><><><><><><><><><><><><><><><><><><><><> ? ?-    U/A - OK - No Infection  ?<><><><><><><><><><><><><><><><><><><><><><><><><><><><><><><><><> ? ?-  A1c = 6.2% - Still in pre or Early Diabetic range  ? ?- Not need medicine yet, But need to work on Better diet & Weight Loss ! ? ? ?- Avoid Sweets, Candy & White Stuff  ? ?- White Rice, White Potatoes, White Flour  - Breads &  Pasta ?<><><><><><><><><><><><><><><><><><><><><><><><><><><><><><><><><> ? ?-   ?-  Total  Chol =    183    -  Slightly  Elevated  ?           (  Ideal  or  Goal is less than 180  !  )  ? ?- and  ? ?-  Bad / Dangerous LDL  Chol = 90     -     also Elevated  ?            (  Ideal  or  Goal is less than 70  !  )  ? ? ?- Recommend a   stricter low cholesterol diet  ? ?- Cholesterol only comes from animal sources  ?- ie. meat, dairy, egg yolks ? ?- Eat all the vegetables you want. ? ?- Avoid meat, especially red meat - Beef AND Pork . ? ?- Avoid cheese & dairy - milk & ice cream.    ? ?- Cheese is the most concentrated form of trans-fats which  ?                                                               is the worst thing to clog up our arteries.  ? ?- Veggie cheese is OK which can be found in the  ? ?                         fresh produce section at [employer] or [employer] or [employer] ?<><><><><><><><><><><><><><><><><><><><><><><><><><><><><><><><><> ? ?-  All Else - CBC  & Thyroid   ? ?- all  Normal / OK ?<><><><><><><><><><><><><><><><><><><><><><><><><><><><><><><><><> ? ? ? ? ? ?

## 2022-03-11 ENCOUNTER — Ambulatory Visit: Payer: Medicare Other

## 2022-03-16 ENCOUNTER — Telehealth: Payer: Self-pay

## 2022-03-16 NOTE — Telephone Encounter (Signed)
LM-03/16/22-Called pt. To confirm CP visit for 03/17/22. Pt. R/s to 8/22 at 4:00PM. ? ? ?Total time spent: 7 min. ?

## 2022-03-17 ENCOUNTER — Telehealth: Payer: Medicare Other | Admitting: Pharmacy Technician

## 2022-03-21 NOTE — Progress Notes (Signed)
? ?Cardiology Clinic Note  ? ?Patient Name: Briana [name] ?Date of Encounter: 03/24/2022 ? ?Primary Care Provider:  Unk Pinto, MD ?Primary Cardiologist:  Shelva Majestic, MD ? ?Patient Profile  ?  ?Briana [name] 88 year old female presents today for follow-up of her chronic diastolic CHF, AKI, and PAF. ? ?Past Medical History  ?  ?Past Medical History:  ?Diagnosis Date  ? Acute on chronic diastolic (congestive) heart failure ([place]) 11/21/2018  ? Cancer [facility])   ? skin cancer  ? DDD (degenerative disc disease)   ? Diverticula of colon   ? Diverticulitis   ? DJD (degenerative joint disease)   ? GERD (gastroesophageal reflux disease)   ? takes ranitidine  ? Heart murmur   ? History of hiatal hernia   ? History of pneumonia   ? Hx of irritable bowel syndrome   ? Hyperlipidemia   ? Hypertension   ? Occasional tremors   ? Osteoporosis   ? Pre-diabetes   ? Varicose veins   ? Vitamin D deficiency   ? ?Past Surgical History:  ?Procedure Laterality Date  ? APPENDECTOMY    ? BREAST EXCISIONAL BIOPSY Right   ? BREAST SURGERY Right   ? fluid drained from breast  ? CARDIOVERSION N/A 12/20/2018  ? Procedure: CARDIOVERSION;  Surgeon: Buford Dresser, MD;  Location: [place];  Service: Cardiovascular;  Laterality: N/A;  ? CARDIOVERSION N/A 06/16/2019  ? Procedure: CARDIOVERSION;  Surgeon: Fay Records, MD;  Location: [place];  Service: Cardiovascular;  Laterality: N/A;  ? COLONOSCOPY    ? ESOPHAGOGASTRODUODENOSCOPY    ? FLEXIBLE SIGMOIDOSCOPY N/A 03/09/2021  ? Procedure: FLEXIBLE SIGMOIDOSCOPY;  Surgeon: Mauri Pole, MD;  Location: [place] [facility];  Service: Endoscopy;  Laterality: N/A;  ? HEMOSTASIS CLIP PLACEMENT  03/09/2021  ? Procedure: HEMOSTASIS CLIP PLACEMENT;  Surgeon: Mauri Pole, MD;  Location: [place] [facility];  Service: Endoscopy;;  ? JOINT REPLACEMENT Left   ? left hip  ? LUMBAR LAMINECTOMY/DECOMPRESSION MICRODISCECTOMY N/A 06/05/2015  ? Procedure: L3-L5 DECOMPRESSION ;  Surgeon: Melina Schools, MD;   Location: [place];  Service: Orthopedics;  Laterality: N/A;  ? OPEN REDUCTION INTERNAL FIXATION (ORIF) DISTAL RADIAL FRACTURE Right 01/10/2014  ? Procedure: OPEN REDUCTION INTERNAL FIXATION (ORIF) DISTAL RADIAL FRACTURE;  Surgeon: Linna Hoff, MD;  Location: [place];  Service: Orthopedics;  Laterality: Right;  ? SPINAL CORD DECOMPRESSION  06/05/2015  ? L3 L 5  ? THYROID SURGERY    ? TONSILLECTOMY    ? TUBAL LIGATION    ? varicose veins stripped    ? WRIST FRACTURE SURGERY Bilateral   ? ? ?Allergies ? ?Allergies  ?Allergen Reactions  ? Accupril [Quinapril Hcl] Cough  ? Neosporin [Neomycin-Bacitracin Zn-Polymyx]   ?  unknown  ? Prednisone   ?  Agitated and shaky  ? Reglan [Metoclopramide] Other (See Comments)  ?  tremors  ? Tramadol Nausea And Vomiting  ? Adhesive [Tape] Rash  ? ? ?History of Present Illness  ?  ?Ms. Briana [name] has a PMH of chronic diastolic CHF, atrial fibrillation diagnosed in 2020, GERD, hyperlipidemia, prediabetes, and IBS.  She underwent DCCV on 2/20 and again on 06/16/2019.  Her second cardioversion was complicated by significant bradycardia prompting discontinuation of her atenolol.  Her atenolol was later restarted.  Subsequent echocardiogram showed an EF of 60 to 65%.  She was seen by me on 9/21 and was doing well at that time. ? ?She contacted the nurse triage line on 01/09/2021 with complaints of weight gain and  lower extremity swelling.  She was instructed to take additional Bumex as needed for weight gain of 2-3 pounds overnight or 5 pounds in a single week.  She called again 01/16/2021 and reported difficulty with her breathing.  She was instructed to take an additional dose of Bumex.  After that she noted 3 days of progressive weakness and difficulty with ambulation.  Her neighbor stopped at her house to check on her 01/22/2021.  She was found on her floor and EMS was called.  She was noted to be hypoxic with an O2 saturation of 88 which improved with 2 L nasal cannula.  While in the ED she was  noted to be tachypneic.  Her potassium was 2.9.  Her initial creatinine was 2.09 (baseline 0.9).  Her WBCs were elevated at 15.6.  Her D-dimer was elevated.  She was COVID negative.  Her TSH was 0.32.  A VQ scan was negative for PE.  She was started on IV fluid with concern for dehydration.  An antibiotic was started for possible UTI.  An echocardiogram at that time showed an LVEF of 55%, moderately elevated PA pressures, mild-moderate MR.  Her home olmesartan and nadolol were held due to acute renal failure and bradycardia.  She was initially given IV fluids but subsequently placed on IV diuretic.  With IV diuresis her renal function improved.  Her apixaban was reduced to 2.5 mg due to her age and creatinine clearance.  She was discharged on amlodipine 2.5, amiodarone 200, furosemide 80 twice daily, apixaban 2.5 mg twice daily, and potassium supplementation.  Her discharge weight was 150 pounds which was down from 163 pounds. ? ?She was seen and followed by Almyra Deforest PA-C on 01/31/2021.  Her weight at that time was 149.8 pounds.  She reported some degree of dyspnea on exertion but denied shortness of breath at rest.  She reported tenderness under bilateral breast.  It did not appear to be related to cardiac issues.  It was felt that it may be related to her bra position.  She did not have significant lower extremity edema and her lungs were clear.  Her EKG showed normal sinus rhythm. ? ?She was admitted to the hospital 03/06/2021 with rectal bleeding, abdominal cramping.  She was noted to have a hemoglobin drop from 12-10.5-9.  GI was consulted and she underwent flexible sigmoidoscopy.  It was recommended that her Eliquis be held for 5 days.  Her repeat hemoglobin stabilized.  It was felt that her lower GI bleed was a result of diverticulosis and internal hemorrhoids.  There was evidence of a impacted diverticular. ? ?She presented to the clinic 03/25/2021 for follow-up evaluation stated she felt well.  She presented  with her daughter [name].  She had been doing physical therapy 1-2 times per week.  We reviewed her 2 recent hospitalizations.  She was using a cane when she ambulated to her mailbox or down her driveway.  She had not had further episodes of bleeding.  She denied chest pain and had been slowly increasing her activity.  I gave her the salty 6 diet sheet and planned follow-up in 6 months. ? ?She presented to the clinic 09/24/21 for 46-monthfollow-up evaluation.  She stated she had noticed that her blood pressure had been somewhat elevated at home.  We reviewed her blood pressure log and it appeared her blood pressures were labile with 130s over 60s-70s and 150s over 60s-70s.  We reviewed the importance of having uncrossed legs, pausing before taking  blood pressure, having arm at chest height eccentric.  She had not noticed any irregular or accelerated beats.  She had been staying physically active with walking at the track and  generally felt well.  Her daughter [name] presented with her and reported that she felt like she had been doing better with her hydration.  We discussed maintaining blood pressure log, the possibility of adding hydralazine to her blood pressure medication regimen, and planned follow-up for 6 months.   ? ?She presents to the clinic today for follow-up evaluation with her daughter [name].  She states that she suffered a fall about 3 weeks ago.  She presented to her PCP who evaluated her.  She does have a hematoma on her right hip.  It appears to be healing well.  Her fall was mechanical in nature.  I will continue her current medication regimen.  Her EKG today shows sinus bradycardia with first-degree AV block.  She denies episodes of palpitations.  We will plan follow-up for 6 months.  She is taking a trip to [facility] and will be staying with her daughter [name] at her condo. ? ?Today she denies chest pain, shortness of breath, lower extremity edema, fatigue, palpitations, melena, hematuria,  hemoptysis, diaphoresis, weakness, presyncope, syncope, orthopnea, and PND. ? ?Home Medications  ?  ?Prior to Admission medications   ?Medication Sig Start Date End Date Taking? Authorizing Provider  ?acetam

## 2022-03-24 ENCOUNTER — Ambulatory Visit: Payer: Medicare Other | Admitting: General Practice

## 2022-03-24 ENCOUNTER — Ambulatory Visit
Admission: RE | Admit: 2022-03-24 | Discharge: 2022-03-24 | Disposition: A | Discharge status: Home / Self Care | Payer: Medicare Other | Source: Ambulatory Visit | Attending: Internal Medicine | Admitting: Internal Medicine

## 2022-03-24 ENCOUNTER — Encounter: Payer: Self-pay | Admitting: General Practice

## 2022-03-24 VITALS — BP 138/60 | Ht 61.0 in | Wt 140.2 lb

## 2022-03-24 DIAGNOSIS — I5032 Chronic diastolic (congestive) heart failure: Secondary | ICD-10-CM | POA: Diagnosis not present

## 2022-03-24 DIAGNOSIS — N179 Acute kidney failure, unspecified: Secondary | ICD-10-CM | POA: Diagnosis not present

## 2022-03-24 DIAGNOSIS — I480 Paroxysmal atrial fibrillation: Secondary | ICD-10-CM

## 2022-03-24 DIAGNOSIS — Z1231 Encounter for screening mammogram for malignant neoplasm of breast: Secondary | ICD-10-CM | POA: Diagnosis not present

## 2022-03-24 DIAGNOSIS — I10 Essential (primary) hypertension: Secondary | ICD-10-CM

## 2022-03-24 NOTE — Patient Instructions (Signed)
Medication Instructions:  ?Your Physician recommend you continue on your current medication as directed.   ? ?*If you need a refill on your cardiac medications before your next appointment, please call your pharmacy* ? ?Follow-Up: ?At [facility], you and your health needs are our priority.  As part of our continuing mission to provide you with exceptional heart care, we have created designated Provider Care Teams.  These Care Teams include your primary Cardiologist (physician) and Advanced Practice Providers (APPs -  Physician Assistants and Nurse Practitioners) who all work together to provide you with the care you need, when you need it. ? ?We recommend signing up for the patient portal called "MyChart".  Sign up information is provided on this After Visit Summary.  MyChart is used to connect with patients for Virtual Visits (Telemedicine).  Patients are able to view lab/test results, encounter notes, upcoming appointments, etc.  Non-urgent messages can be sent to your provider as well.   ?To learn more about what you can do with MyChart, go to [email or web address].   ? ?Your next appointment:   ? Wednesday September 16, 2022 at 10:30 ? ?The format for your next appointment:   ?In Person ? ?Provider:   ?[name], FNP     ? ? ?Other Instructions ?Continue to eat foods low in sodium/salt. ?Increase activity as tolerated. Use caution when walking. Use your assistive device. ? ? ?

## 2022-04-08 ENCOUNTER — Telehealth: Payer: Self-pay

## 2022-04-08 NOTE — Telephone Encounter (Signed)
Spoke with daughter to inform her of appointment change from 11/1 to 11/2 at 10:30 with Thomasene Mohair, NP

## 2022-04-10 DIAGNOSIS — H04123 Dry eye syndrome of bilateral lacrimal glands: Secondary | ICD-10-CM | POA: Diagnosis not present

## 2022-04-10 DIAGNOSIS — H401122 Primary open-angle glaucoma, left eye, moderate stage: Secondary | ICD-10-CM | POA: Diagnosis not present

## 2022-04-10 DIAGNOSIS — H0102B Squamous blepharitis left eye, upper and lower eyelids: Secondary | ICD-10-CM | POA: Diagnosis not present

## 2022-04-10 DIAGNOSIS — H0102A Squamous blepharitis right eye, upper and lower eyelids: Secondary | ICD-10-CM | POA: Diagnosis not present

## 2022-04-10 DIAGNOSIS — Z961 Presence of intraocular lens: Secondary | ICD-10-CM | POA: Diagnosis not present

## 2022-04-10 DIAGNOSIS — H40021 Open angle with borderline findings, high risk, right eye: Secondary | ICD-10-CM | POA: Diagnosis not present

## 2022-04-17 DIAGNOSIS — L570 Actinic keratosis: Secondary | ICD-10-CM | POA: Diagnosis not present

## 2022-04-27 ENCOUNTER — Other Ambulatory Visit: Payer: Self-pay | Admitting: General Practice

## 2022-04-28 NOTE — Telephone Encounter (Signed)
Prescription refill request for Eliquis received. Indication: PAF Last office visit: 03/24/22 Scr: 1.48 Age: 88 Weight: 63.6kg  Discussed and will continue Eliquis 2.'5mg'$  twice daily due to history of elevated SCr and recent fall.  Refill approved.

## 2022-04-30 ENCOUNTER — Encounter: Payer: Self-pay | Admitting: Internal Medicine

## 2022-05-15 IMAGING — DX DG CHEST 1V PORT
1 series · 1 of 1 positions shown · non-contrast
Comparison: 01/22/2021 chest radiograph.

CLINICAL DATA: Dyspnea

EXAM:
PORTABLE CHEST 1 VIEW

[chest ap]
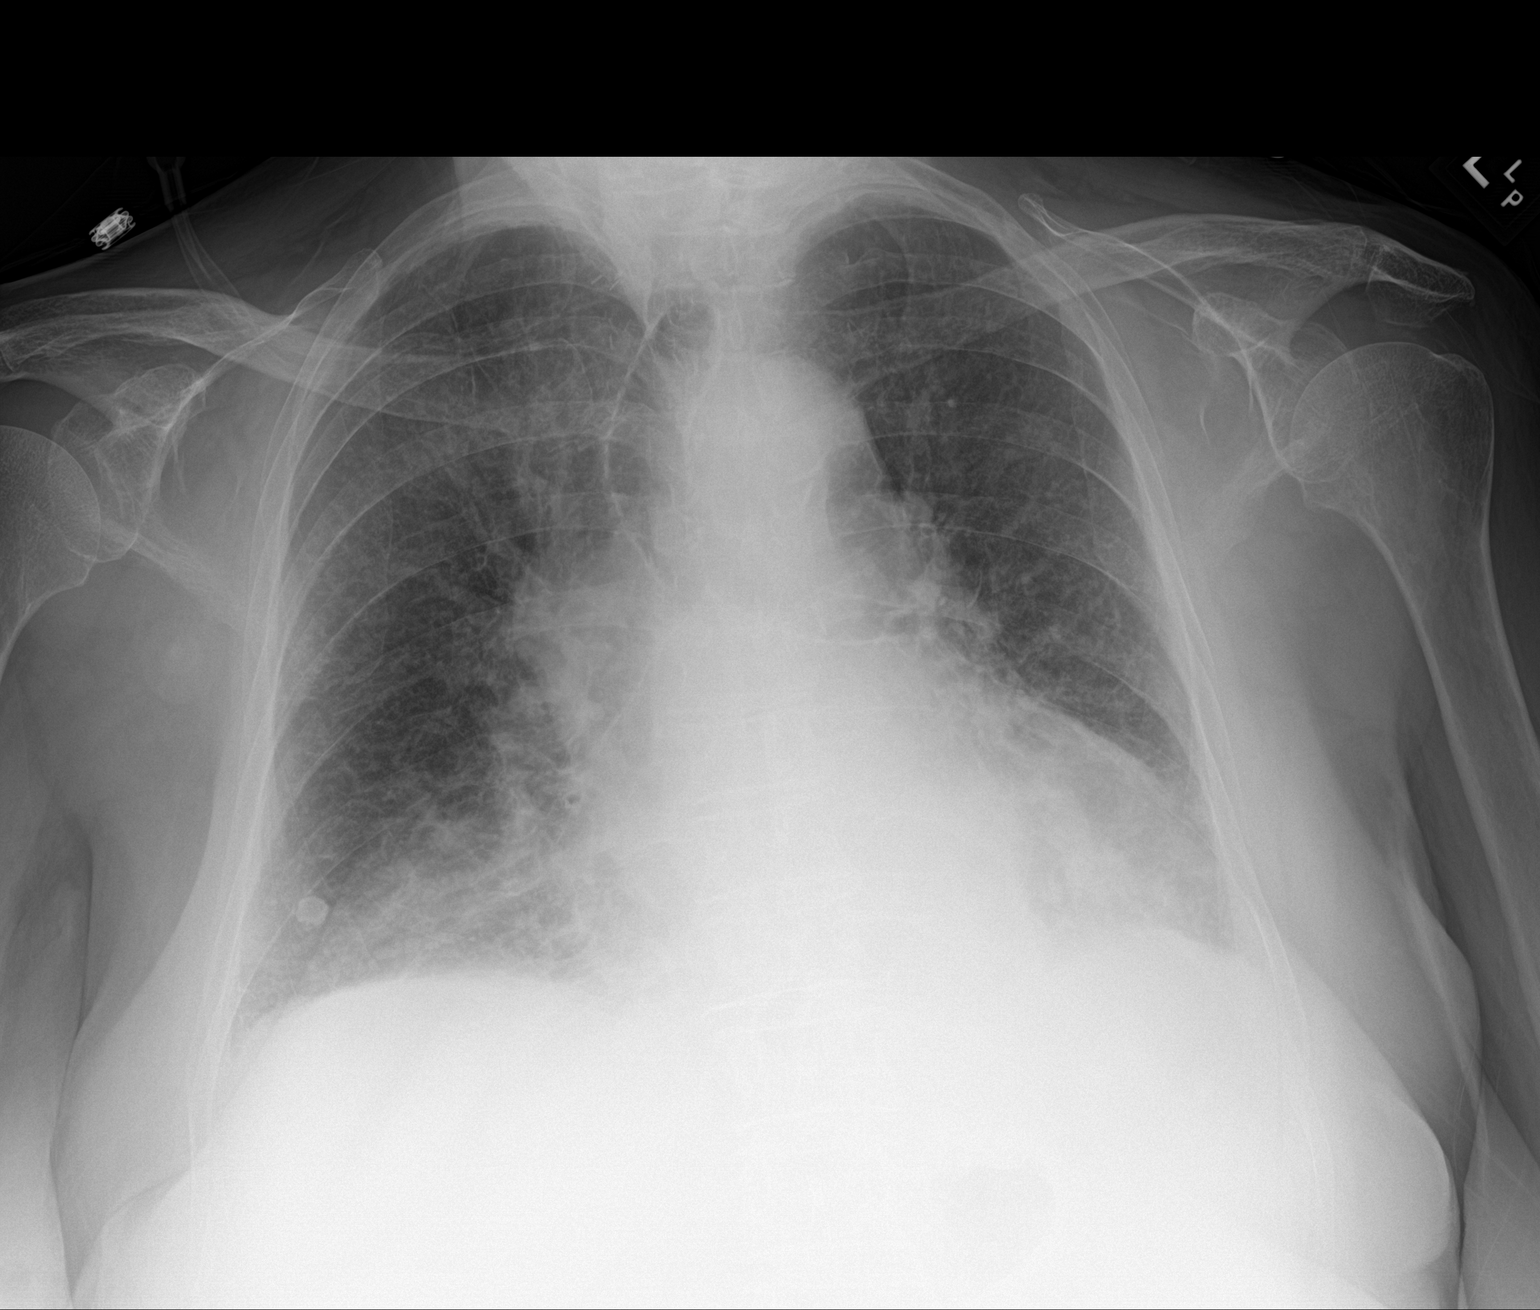

[1 of 1 positions shown; findings below may reference images not displayed]

FINDINGS: Stable cardiomediastinal silhouette with mild cardiomegaly. No
pneumothorax. Trace bilateral pleural effusions. Stable calcified 8
mm right costophrenic angle granuloma. Mild diffuse prominence and
haziness of the parahilar interstitial markings, suggesting mild
pulmonary edema.
IMPRESSION: Mild congestive heart failure with trace bilateral pleural
effusions.

## 2022-05-23 ENCOUNTER — Other Ambulatory Visit: Payer: Self-pay | Admitting: Cardiovascular Disease

## 2022-06-19 ENCOUNTER — Other Ambulatory Visit: Payer: Self-pay | Admitting: Cardiovascular Disease

## 2022-06-20 ENCOUNTER — Other Ambulatory Visit: Payer: Self-pay | Admitting: Internal Medicine

## 2022-06-20 DIAGNOSIS — G250 Essential tremor: Secondary | ICD-10-CM

## 2022-06-22 ENCOUNTER — Ambulatory Visit (INDEPENDENT_AMBULATORY_CARE_PROVIDER_SITE_OTHER): Payer: Medicare Other | Admitting: Nurse Practitioner

## 2022-06-22 ENCOUNTER — Encounter: Payer: Self-pay | Admitting: Nurse Practitioner

## 2022-06-22 VITALS — BP 162/80 | HR 61 | Temp 97.5°F | Ht 61.0 in | Wt 137.0 lb

## 2022-06-22 DIAGNOSIS — E1122 Type 2 diabetes mellitus with diabetic chronic kidney disease: Secondary | ICD-10-CM | POA: Diagnosis not present

## 2022-06-22 DIAGNOSIS — M48061 Spinal stenosis, lumbar region without neurogenic claudication: Secondary | ICD-10-CM | POA: Diagnosis not present

## 2022-06-22 DIAGNOSIS — R6889 Other general symptoms and signs: Secondary | ICD-10-CM | POA: Diagnosis not present

## 2022-06-22 DIAGNOSIS — E559 Vitamin D deficiency, unspecified: Secondary | ICD-10-CM

## 2022-06-22 DIAGNOSIS — I700 Atherosclerosis of aorta: Secondary | ICD-10-CM

## 2022-06-22 DIAGNOSIS — I5032 Chronic diastolic (congestive) heart failure: Secondary | ICD-10-CM | POA: Diagnosis not present

## 2022-06-22 DIAGNOSIS — D6869 Other thrombophilia: Secondary | ICD-10-CM | POA: Diagnosis not present

## 2022-06-22 DIAGNOSIS — N183 Chronic kidney disease, stage 3 unspecified: Secondary | ICD-10-CM

## 2022-06-22 DIAGNOSIS — E785 Hyperlipidemia, unspecified: Secondary | ICD-10-CM

## 2022-06-22 DIAGNOSIS — I10 Essential (primary) hypertension: Secondary | ICD-10-CM

## 2022-06-22 DIAGNOSIS — K2100 Gastro-esophageal reflux disease with esophagitis, without bleeding: Secondary | ICD-10-CM | POA: Diagnosis not present

## 2022-06-22 DIAGNOSIS — N1831 Chronic kidney disease, stage 3a: Secondary | ICD-10-CM

## 2022-06-22 DIAGNOSIS — M85831 Other specified disorders of bone density and structure, right forearm: Secondary | ICD-10-CM | POA: Diagnosis not present

## 2022-06-22 DIAGNOSIS — Z0000 Encounter for general adult medical examination without abnormal findings: Secondary | ICD-10-CM

## 2022-06-22 DIAGNOSIS — Z79899 Other long term (current) drug therapy: Secondary | ICD-10-CM

## 2022-06-22 DIAGNOSIS — E1169 Type 2 diabetes mellitus with other specified complication: Secondary | ICD-10-CM

## 2022-06-22 DIAGNOSIS — Z0001 Encounter for general adult medical examination with abnormal findings: Secondary | ICD-10-CM | POA: Diagnosis not present

## 2022-06-22 DIAGNOSIS — I482 Chronic atrial fibrillation, unspecified: Secondary | ICD-10-CM

## 2022-06-22 NOTE — Progress Notes (Signed)
MEDICARE ANNUAL WELLNESS VISIT AND FOLLOW UP  Assessment:   Diagnoses and all orders for this visit: Briana [name] was seen today for medicare wellness.  Diagnoses and all orders for this visit:  Encounter for Medicare annual wellness exam Due annually  Chronic atrial fibrillation ([place]) Continue medication Follow with cardiology  Chronic diastolic CHF (congestive heart failure) ([place]) CHF Disease process and medications discussed. Questions answered fully. Emphasized salt restriction, less than 2031m a day. Encouraged daily monitoring of the patient's weight, call office if 5 lb weight loss or gain in a day.  Encouraged regular exercise. If any increasing shortness of breath, swelling, or chest pressure go to ER immediately.  decrease your fluid intake to less than 2 L daily please remember to always increase your potassium intake with any increase of your fluid pill.    Type 2 diabetes mellitus with stage 3a chronic kidney disease, without long-term current use of insulin (HCC) Currently diet controlled Continue diet and exercise.  Perform daily foot/skin check, notify office of any concerning changes.  Check A1C  -     CBC with Differential/Platelet -     COMPLETE METABOLIC PANEL WITH GFR -     Hemoglobin A1c  CKD stage 3 due to type 2 diabetes mellitus (HCC) Increase fluids, avoid NSAIDS, monitor sugars, will monitor  -     CBC with Differential/Platelet -     COMPLETE METABOLIC PANEL WITH GFR  Type 2 diabetes mellitus with hyperlipidemia (HCC) Continue diet and exercise  Acquired thrombophilia ([place] Continue to monitor for bleeding/bruising  Aortic atherosclerosis ([place] by Chest CTA on 11/21/2018.  Continue to monitor blood pressure, bloods sugar, weight and cholesterol  Hyperlipidemia associated with type 2 diabetes mellitus ([place] Continue diet and exercise -     Lipid panel  Essential hypertension - continue medications, DASH diet, exercise and monitor at home.  Call if greater than 130/80.   Spinal stenosis of lumbar region without neurogenic claudication Continue Tylenol for pain as needed Monitor  Osteopenia of right forearm DEXA 03/03/21 Continue Vit D supplementation and weight bearing exercises  Gastroesophageal reflux disease with esophagitis, unspecified whether hemorrhage Continue dietary modifications  Vitamin D deficiency Continue Vit D supplementation to maintain value in therapeutic level of 60-100   Medication management Continued    Over 40 minutes of exam, counseling, chart review and critical decision making was performed Future Appointments  Date Time Provider DCorwith 09/17/2022 10:30 AM CDeberah Pelton NP [facility] 09/24/2022  2:30 PM MUnk Pinto MD [facility] None  03/11/2023  3:00 PM MUnk Pinto MD [facility] None  06/14/2023  4:00 PM WAlycia Rossetti NP [facility] None     Plan:   During the course of the visit the patient was educated and counseled about appropriate screening and preventive services including:   Pneumococcal vaccine  Prevnar 13 Influenza vaccine Td vaccine Screening electrocardiogram Bone densitometry screening Colorectal cancer screening Diabetes screening Glaucoma screening Nutrition counseling  Advanced directives: requested   Subjective:  Briana BARKALOWis a 88y.o. female who presents for Medicare Annual Wellness Visit and 3 month follow up.   She had a fall several months walking into shed, fell down 1 step onto her right side.  Developed a large hematoma that is very slowly healing.  No other injuries.  Denies current pain.  Still a lump present but no ecchymosis.  Patient's GERD is controlled without medication  Patient is also followed by Dr RNelva Bushwith [facility] for Lumbar spinal  stenosis and had L3/L5 SS decompression surg in July 2016 by Dr Apolonio Schneiders. No further back pain.   She also has hereditary or essential tremors. Takes Valium for tremors  as needed.   She has atrial fibrillation, cardioversion 12/2018 successful and now on amiodarone and elequis, cardizem 180 mg . Followed by Dr. Claiborne Billings. She does have occasionally palpitations.   BMI is Body mass index is 25.89 kg/m., she has been working on diet (avoiding fried foods) and exercise. She is trying to eat regularly. She is trying to drink more water.  Wt Readings from Last 3 Encounters:  06/22/22 137 lb (62.1 kg)  03/24/22 140 lb 3.2 oz (63.6 kg)  03/09/22 139 lb 6.4 oz (63.2 kg)   Her blood pressure has been controlled at home, today their BP is BP: (!) 162/80 At home her BP has been running 130/66 BP Readings from Last 3 Encounters:  06/22/22 (!) 162/80  03/24/22 138/60  03/09/22 130/68    She does workout. She denies chest pain, shortness of breath, dizziness.    She is not on cholesterol medication and denies myalgias. Her cholesterol is not at goal. The cholesterol last visit was:   Lab Results  Component Value Date   CHOL 183 03/09/2022   HDL 78 03/09/2022   LDLCALC 90 03/09/2022   TRIG 68 03/09/2022   CHOLHDL 2.3 03/09/2022    She has been working on diet and exercise for Type 2 diabetes, and denies foot ulcerations, increased appetite, nausea, paresthesia of the feet, polydipsia, polyuria, visual disturbances, vomiting and weight loss. Last A1C in the office was:  Lab Results  Component Value Date   HGBA1C 6.2 (H) 03/09/2022   Last GFR: Lab Results  Component Value Date   EGFR 34 (L) 08/13/2021    Patient is on Vitamin D supplement and at goal at recent check:    Lab Results  Component Value Date   VD25OH 102 (H) 03/05/2021       Medication Review: Current Outpatient Medications on File Prior to Visit  Medication Sig Dispense Refill   acetaminophen (TYLENOL) 500 MG tablet Take 500 mg by mouth every 6 (six) hours as needed for moderate pain.     albuterol (PROAIR HFA) 108 (90 Base) MCG/ACT inhaler Take  2 inhalations  15 minutes apart  every 4 hours   as needed for Wheezing 48 g 3   amiodarone (PACERONE) 200 MG tablet Take 1 tablet by mouth once daily 90 tablet 3   apixaban (ELIQUIS) 2.5 MG TABS tablet Take 1 tablet by mouth twice daily 60 tablet 3   Carboxymeth-Glycerin-Polysorb (REFRESH DIGITAL OP) Place 1 drop into the right eye daily as needed (dry eyes).     cholecalciferol (VITAMIN D3) 25 MCG (1000 UNIT) tablet Take 1,000 Units by mouth daily. M,W,F     Cyanocobalamin (B-12 SL) Place 1 tablet under the tongue daily.     diazepam (VALIUM) 2 MG tablet Take 1 tablet 3 - 4 x /day for Tremor                                     /                                 TAKE  BY                     MOUTH 120 tablet 1   diltiazem (CARDIZEM CD) 180 MG 24 hr capsule Take  1 capsule  Daily  for Heart Rhythm & BP                         /          Take 1 capsule by mouth once daily 90 capsule 3   Ferrous Sulfate (IRON SLOW RELEASE PO) Take by mouth.     furosemide (LASIX) 80 MG tablet Take 1 tablet by mouth twice daily 120 tablet 3   Melatonin 10 MG TABS Take 1 tablet by mouth daily.     polyethylene glycol (MIRALAX / GLYCOLAX) 17 g packet Take 17 g by mouth daily. 14 each 0   Pyridoxine HCl (VITAMIN B-6 PO) Take 1 capsule by mouth daily.     vitamin C (ASCORBIC ACID) 500 MG tablet Take 500 mg by mouth daily.     zinc gluconate 50 MG tablet Take 50 mg by mouth daily.     potassium chloride SA (KLOR-CON) 20 MEQ tablet Take 1 tablet (20 mEq total) by mouth 2 (two) times daily. (Patient not taking: Reported on 03/24/2022) 60 tablet 0   No current facility-administered medications on file prior to visit.    Allergies  Allergen Reactions   Accupril [Quinapril Hcl] Cough   Neosporin [Neomycin-Bacitracin Zn-Polymyx]     unknown   Prednisone     Agitated and shaky   Reglan [Metoclopramide] Other (See Comments)    tremors   Tramadol Nausea And Vomiting   Adhesive [Tape] Rash    Current Problems (verified) Patient Active Problem List    Diagnosis Date Noted   Diverticular hemorrhage    Hyperparathyroidism due to renal insufficiency ([place]) (Elev PTH 140 on 03/05/2021  03/06/2021   Aortic atherosclerosis ([place]) by Chest CTA on 11/21/2018.  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 07/01/2020   Acquired thrombophilia ([place]) 11/15/2019   Chronic diastolic heart failure ([place]) 04/03/2019   CKD stage 3 due to type 2 diabetes mellitus ([place]) 02/23/2018   Bilateral sensorineural hearing loss 07/02/2017   Obesity (BMI 30.0-34.9) 09/05/2015   Lumbar stenosis 06/05/2015   GERD 11/20/2013   Osteopenia 11/20/2013   Essential hypertension 11/19/2013   Type 2 diabetes mellitus with hyperlipidemia ([place]) 11/19/2013   Vitamin D deficiency 11/19/2013   Diffuse cystic mastopathy 11/19/2013    Screening Tests Immunization History  Administered Date(s) Administered   DT (Pediatric) 09/05/2015   DTaP 11/16/2004   Influenza Whole 08/30/2013   Influenza, High Dose Seasonal PF 08/28/2015, 08/10/2016, 11/20/2019, 09/10/2020   Pneumococcal Conjugate-13 10/19/2016   Pneumococcal Polysaccharide-23 11/16/2001   Zoster, Live 06/01/2013   Preventative care: Last colonoscopy: 2011, Flex Sig 02/2021 Mammogram: 03/03/21 negative repeat 1 year DEXA: 02/2021- T - 2.2  femur  Prior vaccinations: TD or Tdap: 2016  Influenza: 2017, declines   Pneumococcal: 2003 Prevnar13: 2017 Shingles/Zostavax: 2014  Names of Other Physician/Practitioners you currently use: 1. [facility] here for primary care 2. Dr. Katy Fitch, eye doctor, last visit 2022, has scheduled 06/2022 3. Dr. Buelah Manis, dentist, last visit 2022  Patient Care Team: Unk Pinto, MD as PCP - General (Internal Medicine) Troy Sine, MD as PCP - Cardiology (Cardiology) Iran Planas, MD as Consulting Physician (Orthopedic Surgery) Lucio Edward  T, MD as Consulting Physician (Gastroenterology) Suella Broad, MD  as Consulting Physician (Physical Medicine and Rehabilitation) Rolm Bookbinder, MD as Consulting Physician (Dermatology) Buford Dresser, MD as Consulting Physician (Cardiology) Rush Landmark, [facility] as Pharmacist (Pharmacist)  SURGICAL HISTORY She  has a past surgical history that includes Thyroid surgery; varicose veins stripped; Tonsillectomy; Appendectomy; Tubal ligation; Open reduction internal fixation (orif) distal radial fracture (Right, 01/10/2014); Colonoscopy; Esophagogastroduodenoscopy; Breast surgery (Right); Joint replacement (Left); Spinal cord decompression (06/05/2015); Lumbar laminectomy/decompression microdiscectomy (N/A, 06/05/2015); Cardioversion (N/A, 12/20/2018); Cardioversion (N/A, 06/16/2019); Breast excisional biopsy (Right); Flexible sigmoidoscopy (N/A, 03/09/2021); Hemostasis clip placement (03/09/2021); and Wrist fracture surgery (Bilateral). FAMILY HISTORY Her family history includes Cancer in her brother; Heart disease in her sister; Hyperlipidemia in her mother; Hypertension in her mother. SOCIAL HISTORY She  reports that she has never smoked. She has never used smokeless tobacco. She reports that she does not drink alcohol and does not use drugs.   MEDICARE WELLNESS OBJECTIVES: Physical activity: Current Exercise Habits: Home exercise routine, Type of exercise: walking, Time (Minutes): 60, Frequency (Times/Week): 4, Weekly Exercise (Minutes/Week): 240, Intensity: Mild, Exercise limited by: orthopedic condition(s) Cardiac risk factors: Cardiac Risk Factors include: advanced age (>37mn, >>47women);dyslipidemia;diabetes mellitus;hypertension Depression/mood screen:      03/08/2022   11:55 PM  Depression screen PHQ 2/9  Decreased Interest 0  Down, Depressed, Hopeless 0  PHQ - 2 Score 0    ADLs:     06/22/2022    2:50 PM 03/08/2022   11:55 PM  In your present state of health, do you have any difficulty performing the following activities:  Hearing? 0 0  Vision? 0 0   Difficulty concentrating or making decisions? 0 0  Walking or climbing stairs? 1 0  Dressing or bathing? 0 0  Doing errands, shopping? 0 0     Cognitive Testing  Alert? Yes  Normal Appearance?Yes  Oriented to person? Yes  Place? Yes   Time? Yes  Recall of three objects?  Yes  Can perform simple calculations? Yes  Displays appropriate judgment?Yes  Can read the correct time from a watch face?Yes  EOL planning: Does Patient Have a Medical Advance Directive?: No Would patient like information on creating a medical advance directive?: No - Patient declined  Review of Systems  Constitutional:  Negative for chills, fever, malaise/fatigue and weight loss.  HENT:  Negative for congestion, hearing loss, sore throat and tinnitus.   Eyes:  Negative for blurred vision and double vision.  Respiratory:  Negative for cough, sputum production, shortness of breath and wheezing.   Cardiovascular:  Negative for chest pain, palpitations, orthopnea, claudication, leg swelling and PND.  Gastrointestinal:  Negative for abdominal pain, blood in stool, constipation, diarrhea, heartburn, melena, nausea and vomiting.  Genitourinary: Negative.  Negative for dysuria.  Musculoskeletal:  Negative for falls, joint pain and myalgias.  Skin:  Negative for rash.       Had fall several months ago and large hematoma right hip- still 3-4 cm lump but no ecchymosis  Neurological:  Negative for dizziness, tingling, sensory change, weakness and headaches.  Endo/Heme/Allergies:  Negative for polydipsia.  Psychiatric/Behavioral: Negative.  Negative for depression, memory loss, substance abuse and suicidal ideas. The patient is not nervous/anxious and does not have insomnia.   All other systems reviewed and are negative.    Objective:     Today's Vitals   06/22/22 1424  BP: (!) 162/80  Pulse: 61  Temp: (!) 97.5 F (36.4 C)  SpO2: 94%  Weight: 137  lb (62.1 kg)  Height: _0  (1.549 m)   Body mass index is 25.89  kg/m.  General appearance: alert, no distress, WD/WN, female HEENT: normocephalic, sclerae anicteric, TMs pearly, nares patent, no discharge or erythema, pharynx normal Oral cavity: MMM, no lesions Neck: supple, no lymphadenopathy, no thyromegaly, no masses Heart: Regular, normal S1, S2, no murmurs Lungs: CTA bilaterally, no wheezes, rhonchi, or rales Abdomen: +bs, soft, non tender, non distended, no masses, no hepatomegaly, no splenomegaly Musculoskeletal: nontender, no swelling, no obvious deformity, neg straight leg raise . 3-4 cm firm moveable lump right hip- no discoloration Extremities: no edema, no cyanosis, no clubbing Pulses: 2+ symmetric, upper and lower extremities, normal cap refill Neurological: alert, oriented x 3, CN2-12 intact, strength normal upper extremities and lower extremities, sensation normal throughout, DTRs 2+ throughout, no cerebellar signs, gait normal Psychiatric: normal affect, behavior normal, pleasant   Medicare Attestation I have personally reviewed: The patient's medical and social history Their use of alcohol, tobacco or illicit drugs Their current medications and supplements The patient's functional ability including ADLs,fall risks, home safety risks, cognitive, and hearing and visual impairment Diet and physical activities Evidence for depression or mood disorders  The patient's weight, height, BMI, and visual acuity have been recorded in the chart.  I have made referrals, counseling, and provided education to the patient based on review of the above and I have provided the patient with a written personalized care plan for preventive services.     Alycia Rossetti, NP   06/22/2022

## 2022-06-23 LAB — COMPLETE METABOLIC PANEL WITH GFR
AG Ratio: 2 (calc) (ref 1.0–2.5)
ALT: 16 U/L (ref 6–29)
AST: 20 U/L (ref 10–35)
Albumin: 4.5 g/dL (ref 3.6–5.1)
Alkaline phosphatase (APISO): 143 U/L (ref 37–153)
BUN/Creatinine Ratio: 17 (calc) (ref 6–22)
BUN: 22 mg/dL (ref 7–25)
CO2: 31 mmol/L (ref 20–32)
Calcium: 10.1 mg/dL (ref 8.6–10.4)
Chloride: 103 mmol/L (ref 98–110)
Creat: 1.32 mg/dL — ABNORMAL HIGH (ref 0.60–0.95)
Globulin: 2.2 g/dL (calc) (ref 1.9–3.7)
Glucose, Bld: 121 mg/dL — ABNORMAL HIGH (ref 65–99)
Potassium: 4.2 mmol/L (ref 3.5–5.3)
Sodium: 141 mmol/L (ref 135–146)
Total Bilirubin: 0.6 mg/dL (ref 0.2–1.2)
Total Protein: 6.7 g/dL (ref 6.1–8.1)
eGFR: 39 mL/min/{1.73_m2} — ABNORMAL LOW (ref >=60)

## 2022-06-23 LAB — CBC WITH DIFFERENTIAL/PLATELET
Absolute Monocytes: 593 cells/uL (ref 200–950)
Basophils Absolute: 29 cells/uL (ref 0–200)
Basophils Relative: 0.5 %
Eosinophils Absolute: 63 cells/uL (ref 15–500)
Eosinophils Relative: 1.1 %
HCT: 41.3 % (ref 35.0–45.0)
Hemoglobin: 14 g/dL (ref 11.7–15.5)
Lymphs Abs: 804 cells/uL — ABNORMAL LOW (ref 850–3900)
MCH: 31.5 pg (ref 27.0–33.0)
MCHC: 33.9 g/dL (ref 32.0–36.0)
MCV: 92.8 fL (ref 80.0–100.0)
MPV: 11.5 fL (ref 7.5–12.5)
Monocytes Relative: 10.4 %
Neutro Abs: 4212 cells/uL (ref 1500–7800)
Neutrophils Relative %: 73.9 %
Platelets: 205 10*3/uL (ref 140–400)
RBC: 4.45 10*6/uL (ref 3.80–5.10)
RDW: 12.9 % (ref 11.0–15.0)
Total Lymphocyte: 14.1 %
WBC: 5.7 10*3/uL (ref 3.8–10.8)

## 2022-06-23 LAB — LIPID PANEL
Cholesterol: 205 mg/dL — ABNORMAL HIGH (ref <200)
HDL: 67 mg/dL (ref >=50)
LDL Cholesterol (Calc): 120 mg/dL (calc) — ABNORMAL HIGH
Non-HDL Cholesterol (Calc): 138 mg/dL (calc) — ABNORMAL HIGH (ref <130)
Total CHOL/HDL Ratio: 3.1 (calc) (ref <5.0)
Triglycerides: 79 mg/dL (ref <150)

## 2022-06-23 LAB — HEMOGLOBIN A1C
Hgb A1c MFr Bld: 6.4 % of total Hgb — ABNORMAL HIGH (ref <5.7)
Mean Plasma Glucose: 137 mg/dL
eAG (mmol/L): 7.6 mmol/L

## 2022-06-25 ENCOUNTER — Telehealth: Payer: Self-pay

## 2022-06-25 NOTE — Telephone Encounter (Signed)
LM-06/25/22-Called pt. To r/s CP FOC for September and inform pt. I will call her on 8/22 to complete an assessment. Unable to reach pt. Will try calling at another time.  Total time spent: 2 min.

## 2022-06-26 NOTE — Telephone Encounter (Signed)
LM-06/26/22-Called and spoke w/ pts. Daughter [name] and r/s pt. FOC w/ CP to 9/19 and informed her it will be a brief phone call and will be called between 3-5pm. PT. Daughter verbalized understanding and agreed.  Total time spent: 7 min.

## 2022-07-07 ENCOUNTER — Telehealth: Payer: Self-pay

## 2022-07-07 ENCOUNTER — Telehealth: Payer: Medicare Other | Admitting: Pharmacy Technician

## 2022-07-07 NOTE — Telephone Encounter (Signed)
LM-07/07/22-Chart review started. Reviewing OV, Consults, Hospital visits, Labs and medication changes.  Called pt. And completed HF review call. Pt. Has been weighing herself daily, walks twice a week for an hour with friends for exercise, and diet consists of mostly home cooked meals, and fruits and vegetables. Pt. Denied and health concerns/questions. Told pt. To keep up the good work and counseled on limiting salt intake to no more than '1500mg'$  daily and to check food labels for sodium content. Encouarged patient to continue exercising. Pt. Verbalized understanding and agreed. We did not discuss constipation, because patients time was limited.  Total time spent: 23 min.

## 2022-07-08 ENCOUNTER — Telehealth: Payer: Self-pay

## 2022-07-08 NOTE — Telephone Encounter (Signed)
LM-07/08/22-Was informed by schedule coordinator that pt. Called this morning but did not LVM. Called pt. To see if she has any questions/concerns. Unable to reach pt. [place] if she needs any assistance.  Total time spent: 2 min.

## 2022-07-16 DIAGNOSIS — E1169 Type 2 diabetes mellitus with other specified complication: Secondary | ICD-10-CM | POA: Diagnosis not present

## 2022-07-16 DIAGNOSIS — E1122 Type 2 diabetes mellitus with diabetic chronic kidney disease: Secondary | ICD-10-CM | POA: Diagnosis not present

## 2022-07-16 DIAGNOSIS — I10 Essential (primary) hypertension: Secondary | ICD-10-CM | POA: Diagnosis not present

## 2022-07-16 DIAGNOSIS — E785 Hyperlipidemia, unspecified: Secondary | ICD-10-CM | POA: Diagnosis not present

## 2022-07-28 ENCOUNTER — Telehealth: Payer: Self-pay

## 2022-07-28 NOTE — Telephone Encounter (Signed)
LM-07/28/22-Reviewed chart and updated med list for focused outreach call on 9/19. Called pt. Daughter [name] and confirmed visit. Precall questions completed. Pt. Daughter asked if CP could call her number and confrence in pt. On a 3 way call, told daughter I would inform CP. Pt. Daughter mentioned pts. Eliquis is too expensive. Informed daughter about PAP programs and she'd like to discuss more at CP visit.  Total time spent: 30 min.

## 2022-08-04 ENCOUNTER — Ambulatory Visit: Payer: Medicare Other | Admitting: Pharmacy Technician

## 2022-08-04 DIAGNOSIS — N183 Chronic kidney disease, stage 3 unspecified: Secondary | ICD-10-CM

## 2022-08-04 NOTE — Progress Notes (Signed)
Spoke to patient and daughter today via phone. Daughter states patient has really been on a good path lately. She is managing her own medications without error (they check behind her) and she is able to still be active and social. Madi has been working on her water intake to the point she is experiencing nocturia 2-3x nightly. We discussed limiting fluids after 8pm. She is trying to be more mindful of her diet and food choices to lower her LDL and A1c. She does have some occasional constipation, but she does not go more than 2 days without a bowel movement. She uses Miralax and Fibercon. She is walking at the track daily.     CPP Televisit: 62mn  AMarda Stalker PharmD Clinical Pharmacist ANaida Sleightbelton'@upstream'$ .care ([phone]

## 2022-08-12 DIAGNOSIS — H401122 Primary open-angle glaucoma, left eye, moderate stage: Secondary | ICD-10-CM | POA: Diagnosis not present

## 2022-08-12 DIAGNOSIS — H353131 Nonexudative age-related macular degeneration, bilateral, early dry stage: Secondary | ICD-10-CM | POA: Diagnosis not present

## 2022-08-12 DIAGNOSIS — H0102B Squamous blepharitis left eye, upper and lower eyelids: Secondary | ICD-10-CM | POA: Diagnosis not present

## 2022-08-12 DIAGNOSIS — E119 Type 2 diabetes mellitus without complications: Secondary | ICD-10-CM | POA: Diagnosis not present

## 2022-08-12 DIAGNOSIS — Z961 Presence of intraocular lens: Secondary | ICD-10-CM | POA: Diagnosis not present

## 2022-08-12 DIAGNOSIS — H40021 Open angle with borderline findings, high risk, right eye: Secondary | ICD-10-CM | POA: Diagnosis not present

## 2022-08-12 DIAGNOSIS — H0102A Squamous blepharitis right eye, upper and lower eyelids: Secondary | ICD-10-CM | POA: Diagnosis not present

## 2022-08-12 DIAGNOSIS — H43811 Vitreous degeneration, right eye: Secondary | ICD-10-CM | POA: Diagnosis not present

## 2022-08-12 DIAGNOSIS — H04123 Dry eye syndrome of bilateral lacrimal glands: Secondary | ICD-10-CM | POA: Diagnosis not present

## 2022-08-13 ENCOUNTER — Ambulatory Visit: Payer: Medicare Other | Admitting: Nurse Practitioner

## 2022-08-15 DIAGNOSIS — E1122 Type 2 diabetes mellitus with diabetic chronic kidney disease: Secondary | ICD-10-CM

## 2022-08-15 DIAGNOSIS — N183 Chronic kidney disease, stage 3 unspecified: Secondary | ICD-10-CM

## 2022-08-15 DIAGNOSIS — I10 Essential (primary) hypertension: Secondary | ICD-10-CM

## 2022-08-17 ENCOUNTER — Telehealth: Payer: Self-pay

## 2022-08-17 ENCOUNTER — Other Ambulatory Visit: Payer: Self-pay | Admitting: Cardiovascular Disease

## 2022-08-17 DIAGNOSIS — I480 Paroxysmal atrial fibrillation: Secondary | ICD-10-CM

## 2022-08-17 NOTE — Telephone Encounter (Signed)
LM-08/17/22-Called pt. And assessed nocturia.Pt. reported she is better at the moment and is waking up less to urinate. Pt. Informed me she has been actively trying to stop fluids at 8pm. Pt. Reported she has been awoken to urinate 1-2 times a night. Asked pt. If she voids a large or small amount of urine each time, pt. Was unsure but stated "Maybe a lot, I am not really sure." Educated pt. On tips to help nocturia- Avoid drinking caffeine in the evening, stop drinking fluids at 9pm, limit excessive food intake 3 hours before bedtime, pre-emptive voiding, and elevating the legs an hour or more before bed in order to redistribute fluids back into the bloodstream and reduce the need to pass urine. Pt. Verbalized understanding and agreed.  Encouraged pt. To reach out if she has any questions/concerns. Pt. Verbalized understanding and agreed.  Total time spent: 18 min.

## 2022-08-17 NOTE — Telephone Encounter (Signed)
Eliquis 2.'5mg'$  refill request received. Patient is 88 years old, weight-62.1kg, Crea-1.32 on 06/22/2022, Diagnosis-Afib, and last seen by [name] on 03/24/2022. Dose is appropriate based on dosing criteria. Will send in refill to requested pharmacy.

## 2022-09-02 ENCOUNTER — Other Ambulatory Visit: Payer: Self-pay | Admitting: Internal Medicine

## 2022-09-14 NOTE — Progress Notes (Unsigned)
Cardiology Clinic Note   Patient Name: Briana [name] Date of Encounter: 09/17/2022  Primary Care Provider:  Unk Pinto, MD Primary Cardiologist:  Shelva Majestic, MD  Patient Profile    Briana [name] 88 year old female presents today for follow-up of her chronic diastolic CHF, AKI, and PAF.  Past Medical History    Past Medical History:  Diagnosis Date   Acute on chronic diastolic (congestive) heart failure ([place]) 11/21/2018   Cancer ([place])    skin cancer   DDD (degenerative disc disease)    Diverticula of colon    Diverticulitis    DJD (degenerative joint disease)    GERD (gastroesophageal reflux disease)    takes ranitidine   Heart murmur    History of hiatal hernia    History of pneumonia    Hx of irritable bowel syndrome    Hyperlipidemia    Hypertension    Occasional tremors    Osteoporosis    Pre-diabetes    Varicose veins    Vitamin D deficiency    Past Surgical History:  Procedure Laterality Date   APPENDECTOMY     BREAST EXCISIONAL BIOPSY Right    BREAST SURGERY Right    fluid drained from breast   CARDIOVERSION N/A 12/20/2018   Procedure: CARDIOVERSION;  Surgeon: Buford Dresser, MD;  Location: [facility];  Service: Cardiovascular;  Laterality: N/A;   CARDIOVERSION N/A 06/16/2019   Procedure: CARDIOVERSION;  Surgeon: Fay Records, MD;  Location: [facility];  Service: Cardiovascular;  Laterality: N/A;   COLONOSCOPY     ESOPHAGOGASTRODUODENOSCOPY     FLEXIBLE SIGMOIDOSCOPY N/A 03/09/2021   Procedure: FLEXIBLE SIGMOIDOSCOPY;  Surgeon: Mauri Pole, MD;  Location: [facility];  Service: Endoscopy;  Laterality: N/A;   HEMOSTASIS CLIP PLACEMENT  03/09/2021   Procedure: HEMOSTASIS CLIP PLACEMENT;  Surgeon: Mauri Pole, MD;  Location: [facility];  Service: Endoscopy;;   JOINT REPLACEMENT Left    left hip   LUMBAR LAMINECTOMY/DECOMPRESSION MICRODISCECTOMY N/A 06/05/2015   Procedure: L3-L5 DECOMPRESSION ;  Surgeon: Melina Schools, MD;   Location: [facility];  Service: Orthopedics;  Laterality: N/A;   OPEN REDUCTION INTERNAL FIXATION (ORIF) DISTAL RADIAL FRACTURE Right 01/10/2014   Procedure: OPEN REDUCTION INTERNAL FIXATION (ORIF) DISTAL RADIAL FRACTURE;  Surgeon: Linna Hoff, MD;  Location: [facility];  Service: Orthopedics;  Laterality: Right;   SPINAL CORD DECOMPRESSION  06/05/2015   L3 L 5   THYROID SURGERY     TONSILLECTOMY     TUBAL LIGATION     varicose veins stripped     WRIST FRACTURE SURGERY Bilateral     Allergies  Allergies  Allergen Reactions   Accupril [Quinapril Hcl] Cough   Neosporin [Neomycin-Bacitracin Zn-Polymyx]     unknown   Prednisone     Agitated and shaky   Reglan [Metoclopramide] Other (See Comments)    tremors   Tramadol Nausea And Vomiting   Adhesive [Tape] Rash    History of Present Illness    Briana [name] has a PMH of chronic diastolic CHF, atrial fibrillation diagnosed in 2020, GERD, hyperlipidemia, prediabetes, and IBS.  She underwent DCCV on 2/20 and again on 06/16/2019.  Her second cardioversion was complicated by significant bradycardia prompting discontinuation of her atenolol.  Her atenolol was later restarted.  Subsequent echocardiogram showed an EF of 60 to 65%.  She was seen by me on 9/21 and was doing well at that time.  She contacted the nurse triage line on 01/09/2021 with complaints of weight gain and  lower extremity swelling.  She was instructed to take additional Bumex as needed for weight gain of 2-3 pounds overnight or 5 pounds in a single week.  She called again 01/16/2021 and reported difficulty with her breathing.  She was instructed to take an additional dose of Bumex.  After that she noted 3 days of progressive weakness and difficulty with ambulation.  Her neighbor stopped at her house to check on her 01/22/2021.  She was found on her floor and EMS was called.  She was noted to be hypoxic with an O2 saturation of 88 which improved with 2 L nasal cannula.  While in the ED she was  noted to be tachypneic.  Her potassium was 2.9.  Her initial creatinine was 2.09 (baseline 0.9).  Her WBCs were elevated at 15.6.  Her D-dimer was elevated.  She was COVID negative.  Her TSH was 0.32.  A VQ scan was negative for PE.  She was started on IV fluid with concern for dehydration.  An antibiotic was started for possible UTI.  An echocardiogram at that time showed an LVEF of 55%, moderately elevated PA pressures, mild-moderate MR.  Her home olmesartan and nadolol were held due to acute renal failure and bradycardia.  She was initially given IV fluids but subsequently placed on IV diuretic.  With IV diuresis her renal function improved.  Her apixaban was reduced to 2.5 mg due to her age and creatinine clearance.  She was discharged on amlodipine 2.5, amiodarone 200, furosemide 80 twice daily, apixaban 2.5 mg twice daily, and potassium supplementation.  Her discharge weight was 150 pounds which was down from 163 pounds.  She was seen and followed by Almyra Deforest PA-C on 01/31/2021.  Her weight at that time was 149.8 pounds.  She reported some degree of dyspnea on exertion but denied shortness of breath at rest.  She reported tenderness under bilateral breast.  It did not appear to be related to cardiac issues.  It was felt that it may be related to her bra position.  She did not have significant lower extremity edema and her lungs were clear.  Her EKG showed normal sinus rhythm.  She was admitted to the hospital 03/06/2021 with rectal bleeding, abdominal cramping.  She was noted to have a hemoglobin drop from 12-10.5-9.  GI was consulted and she underwent flexible sigmoidoscopy.  It was recommended that her Eliquis be held for 5 days.  Her repeat hemoglobin stabilized.  It was felt that her lower GI bleed was a result of diverticulosis and internal hemorrhoids.  There was evidence of a impacted diverticular.  She presented to the clinic 03/25/2021 for follow-up evaluation stated she felt well.  She presented  with her daughter Briana [name].  She had been doing physical therapy 1-2 times per week.  We reviewed her 2 recent hospitalizations.  She was using a cane when she ambulated to her mailbox or down her driveway.  She had not had further episodes of bleeding.  She denied chest pain and had been slowly increasing her activity.  I gave her the salty 6 diet sheet and planned follow-up in 6 months.  She presented to the clinic 09/24/21 for 23-monthfollow-up evaluation.  She stated she had noticed that her blood pressure had been somewhat elevated at home.  We reviewed her blood pressure log and it appeared her blood pressures were labile with 130s over 60s-70s and 150s over 60s-70s.  We reviewed the importance of having uncrossed legs, pausing before taking  blood pressure, having arm at chest height eccentric.  She had not noticed any irregular or accelerated beats.  She had been staying physically active with walking at the track and  generally felt well.  Her daughter Briana [name] presented with her and reported that she felt like she had been doing better with her hydration.  We discussed maintaining blood pressure log, the possibility of adding hydralazine to her blood pressure medication regimen, and planned follow-up for 6 months.    She presented  to the clinic 03/24/2022 for follow-up evaluation with her daughter Briana [name].  She states that she suffered a fall about 3 weeks prior.  She presented to her PCP who evaluated her.  She did have a hematoma on her right hip.  It appeared to be healing well.  Her fall was mechanical in nature.  I continued her current medication regimen.  Her EKG  showed sinus bradycardia with first-degree AV block.  She denied episodes of palpitations.  We planned follow-up for 6 months.  She was taking a trip to [facility] and would be staying with her daughter Briana [name] at her condo.  She presented to the emergency department on 09/15/2022.  She reported acute abdominal pain.  CT scan was reassuring.   Her pain was much improved after passing gas.  Her tenderness resolved.  It was not felt to be related to cardiac causes.  She was discharged home in stable condition.  She presents to the clinic today for follow-up evaluation states she is feeling well today.  She presents with her daughter Briana [name].  We reviewed her recent trip to the emergency department with her abdominal pain.  She has had no further occurrences and appears to be related to GI/gas.  Her blood pressure initially today is 140/78 and on recheck is 118/62.  We reviewed medications.  She has been eating a low-sodium diet and occasionally uses no salt.  We reviewed her labs from the emergency department which showed a slightly elevated creatinine.  She has follow-up with nephrology next week.  She continues to be physically active walking with friends at the church track 2 days/week.  Her daughter Briana [name] reports that she will be moving to [place] and is working on selling her house.  Her other 2 daughters Briana [name] and Briana [name] will be bringing her to her doctor's visits.  I will continue her current medication regimen and plan follow-up in 6 months.  We will look to see if we can give her Eliquis samples today.   Today she denies chest pain, shortness of breath, lower extremity edema, fatigue, palpitations, melena, hematuria, hemoptysis, diaphoresis, weakness, presyncope, syncope, orthopnea, and PND.  Home Medications    Prior to Admission medications   Medication Sig Start Date End Date Taking? Authorizing Provider  acetaminophen (TYLENOL) 500 MG tablet Take 500 mg by mouth every 6 (six) hours as needed for moderate pain.    [provider]  amiodarone (PACERONE) 200 MG tablet Take 1 tablet (200 mg total) by mouth daily. 07/23/21   Unk Pinto, MD  apixaban (ELIQUIS) 2.5 MG TABS tablet Take 1 tablet (2.5 mg total) by mouth 2 (two) times daily. 03/25/21   Deberah Pelton, NP  Carboxymeth-Glycerin-Polysorb (REFRESH DIGITAL OP) Place  1 drop into the right eye daily as needed (dry eyes).    [provider]  Cyanocobalamin (B-12 SL) Place 1 tablet under the tongue daily.    [provider]  diazepam (VALIUM) 2 MG tablet Take  1 tablet  4 x /day  for Tremor          / TAKE 1 TABLET BY MOUTH 3- 4 TIMES DAILY WITH MEALS FOR TREMOR 08/14/21   Unk Pinto, MD  diltiazem (CARDIZEM CD) 180 MG 24 hr capsule Take  1 capsule  Daily  for Heart Rhythm & BP                         /          Take 1 capsule by mouth once daily 09/13/21   Unk Pinto, MD  furosemide (LASIX) 80 MG tablet Take 1 tablet (80 mg total) by mouth 2 (two) times daily. 02/25/21   Troy Sine, MD  polyethylene glycol (MIRALAX / GLYCOLAX) 17 g packet Take 17 g by mouth daily. 03/11/21   Hosie Poisson, MD  potassium chloride SA (KLOR-CON) 20 MEQ tablet Take 1 tablet (20 mEq total) by mouth 2 (two) times daily. Patient taking differently: Take 20 mEq by mouth daily. 03/20/21   Unk Pinto, MD  PROAIR HFA 108 [id number] Base) MCG/ACT inhaler Inhale 2 puffs into the lungs every 6 (six) hours as needed for wheezing or shortness of breath. 01/28/21   Hongalgi, Lenis Dickinson, MD  Pyridoxine HCl (VITAMIN B-6 PO) Take 1 capsule by mouth daily.    [provider]  vitamin C (ASCORBIC ACID) 500 MG tablet Take 500 mg by mouth daily.    [provider]  zinc gluconate 50 MG tablet Take 50 mg by mouth daily.    [provider]    Family History    Family History  Problem Relation Age of Onset   Hypertension Mother    Hyperlipidemia Mother    Heart disease Sister    Cancer Brother        prostate   Colon cancer Neg Hx    Esophageal cancer Neg Hx    Pancreatic cancer Neg Hx    Stomach cancer Neg Hx    Liver disease Neg Hx    Breast cancer Neg Hx    She indicated that her mother is deceased. She indicated that her father is deceased. She indicated that her sister is deceased. She indicated that her brother is alive. She indicated  that the status of her neg hx is unknown.   Social History    Social History   Socioeconomic History   Marital status: Widowed    Spouse name: Not on file   Number of children: Not on file   Years of education: Not on file   Highest education level: Not on file  Occupational History   Not on file  Tobacco Use   Smoking status: Never   Smokeless tobacco: Never  Substance and Sexual Activity   Alcohol use: No   Drug use: No   Sexual activity: Not on file  Other Topics Concern   Not on file  Social History Narrative   Not on file   Social Determinants of Health   Financial Resource Strain: Not on file  Food Insecurity: No Food Insecurity (10/31/2021)   Hunger Vital Sign    Worried About Running Out of Food in the Last Year: Never true    Ran Out of Food in the Last Year: Never true  Transportation Needs: No Transportation Needs (10/31/2021)   PRAPARE - Hydrologist (Medical): No    Lack of Transportation (Non-Medical): No  Physical Activity: Not on file  Stress: Not on file  Social Connections: Not on file  Intimate Partner Violence: Not on file     Review of Systems    General:  No chills, fever, night sweats or weight changes.  Cardiovascular:  No chest pain, dyspnea on exertion, edema, orthopnea, palpitations, paroxysmal nocturnal dyspnea. Dermatological: No rash, lesions/masses Respiratory: No cough, dyspnea Urologic: No hematuria, dysuria Abdominal:   No nausea, vomiting, diarrhea, bright red blood per rectum, melena, or hematemesis Neurologic:  No visual changes, wkns, changes in mental status. All other systems reviewed and are otherwise negative except as noted above.  Physical Exam    VS:  BP 118/62   Pulse 62   Wt 136 lb 3.2 oz (61.8 kg)   SpO2 94%   BMI 25.73 kg/m  , BMI Body mass index is 25.73 kg/m. GEN: Well nourished, well developed, in no acute distress. HEENT: normal. Neck: Supple, no JVD, carotid bruits, or  masses. Cardiac: RRR, no murmurs, rubs, or gallops. No clubbing, cyanosis, edema.  Radials/DP/PT 2+ and equal bilaterally.  Respiratory:  Respirations regular and unlabored, clear to auscultation bilaterally. GI: Soft, nontender, nondistended, BS + x 4. MS: no deformity or atrophy. Skin: warm and dry, no rash.  Healing hematoma right hip. Neuro:  Strength and sensation are intact. Psych: Normal affect.  Accessory Clinical Findings    Recent Labs: 03/09/2022: TSH 1.14 09/15/2022: ALT 19; BUN 26; Creatinine, Ser 1.53; Hemoglobin 14.0; Platelets 195; Potassium 4.2; Sodium 137   Recent Lipid Panel    Component Value Date/Time   CHOL 205 (H) 06/22/2022 1503   TRIG 79 06/22/2022 1503   HDL 67 06/22/2022 1503   CHOLHDL 3.1 06/22/2022 1503   VLDL 15 05/04/2017 1339   LDLCALC 120 (H) 06/22/2022 1503    ECG personally reviewed by me today-none today.  EKG 03/24/2022 sinus bradycardia with first-degree AV block left axis deviation right bundle branch block atrial septal infarct undetermined age 38 bpm  Echocardiogram 01/24/2021 IMPRESSIONS     1. Left ventricular ejection fraction, by estimation, is 55%. The left  ventricle has normal function. The left ventricle has no regional wall  motion abnormalities. There is mild left ventricular hypertrophy. Left  ventricular diastolic parameters are  indeterminate.   2. Right ventricular systolic function is normal. The right ventricular  size is mildly enlarged. There is moderately elevated pulmonary artery  systolic pressure. The estimated right ventricular systolic pressure is  17.6 mmHg.   3. Left atrial size was moderately dilated.   4. Right atrial size was mildly dilated.   5. The mitral valve is grossly normal. Mild to moderate mitral valve  regurgitation. No evidence of mitral stenosis.   6. The aortic valve is grossly normal. Aortic valve regurgitation is not  visualized. No aortic stenosis is present.   7. The inferior vena cava  is dilated in size with <50% respiratory  variability, suggesting right atrial pressure of 15 mmHg.    Assessment & Plan   1.  Paroxysmal atrial fibrillation-heart rate today 62 bpm.  No recent episodes of accelerated or irregular heartbeat.  No bleeding issues.  Cardiac unaware.CHA2DS2-VASc score 6 (chf,htn,age, diabetes, female).  CBC stable. Continue apixaban, amiodarone, diltiazem Heart healthy low-sodium diet-reviewed Maintain physical activity Continue to avoid triggers caffeine, chocolate, EtOH, dehydration etc-reviewed  Chronic diastolic CHF-euvolemic today.  Weight remains stable.  Continues to be physically active.    Heart healthy low-sodium diet-salty 6 given-reviewed Maintain physical activity Continue Daily weights  Essential hypertension-BP today 118/62.  Well-controlled  at home. Continue furosemide, potassium, diltiazem Heart healthy low-sodium diet Increase physical activity as tolerated Maintain blood pressure log  AKI-creatinine stable with most recent lab work. Follow with PCP. Request labs from PCP  Right hip hematoma-resolved.  No further episodes of falls.   Fall precautions reviewed  Disposition: Follow-up with Dr. Claiborne Billings  in 6 months.   Jossie Ng. Brok Stocking NP-C    09/17/2022, 11:15 AM [place] [address] Office [phone] Fax [phone]  Notice: This dictation was prepared with Dragon dictation along with smaller phrase technology. Any transcriptional errors that result from this process are unintentional and may not be corrected upon review.  I spent 15 minutes examining this patient, reviewing medications, and using patient centered shared decision making involving her cardiac care.  Prior to her visit I spent greater than 20 minutes reviewing her past medical history,  medications, and prior cardiac tests.

## 2022-09-15 ENCOUNTER — Emergency Department (HOSPITAL_COMMUNITY): Payer: Medicare Other

## 2022-09-15 ENCOUNTER — Other Ambulatory Visit: Payer: Self-pay

## 2022-09-15 ENCOUNTER — Encounter (HOSPITAL_COMMUNITY): Payer: Self-pay

## 2022-09-15 ENCOUNTER — Emergency Department (HOSPITAL_COMMUNITY)
Admission: EM | Admit: 2022-09-15 | Discharge: 2022-09-15 | Disposition: A | Discharge status: Home / Self Care | Payer: Medicare Other | Attending: Emergency Medicine | Admitting: Emergency Medicine

## 2022-09-15 DIAGNOSIS — R109 Unspecified abdominal pain: Secondary | ICD-10-CM | POA: Diagnosis not present

## 2022-09-15 DIAGNOSIS — R059 Cough, unspecified: Secondary | ICD-10-CM | POA: Diagnosis not present

## 2022-09-15 DIAGNOSIS — R1011 Right upper quadrant pain: Secondary | ICD-10-CM | POA: Diagnosis not present

## 2022-09-15 DIAGNOSIS — R1010 Upper abdominal pain, unspecified: Secondary | ICD-10-CM | POA: Diagnosis not present

## 2022-09-15 DIAGNOSIS — R0789 Other chest pain: Secondary | ICD-10-CM | POA: Insufficient documentation

## 2022-09-15 DIAGNOSIS — Z743 Need for continuous supervision: Secondary | ICD-10-CM | POA: Diagnosis not present

## 2022-09-15 DIAGNOSIS — Z7901 Long term (current) use of anticoagulants: Secondary | ICD-10-CM | POA: Insufficient documentation

## 2022-09-15 DIAGNOSIS — R6889 Other general symptoms and signs: Secondary | ICD-10-CM | POA: Diagnosis not present

## 2022-09-15 DIAGNOSIS — R1084 Generalized abdominal pain: Secondary | ICD-10-CM | POA: Diagnosis not present

## 2022-09-15 DIAGNOSIS — K5730 Diverticulosis of large intestine without perforation or abscess without bleeding: Secondary | ICD-10-CM | POA: Diagnosis not present

## 2022-09-15 DIAGNOSIS — R1012 Left upper quadrant pain: Secondary | ICD-10-CM | POA: Diagnosis not present

## 2022-09-15 DIAGNOSIS — I10 Essential (primary) hypertension: Secondary | ICD-10-CM | POA: Diagnosis not present

## 2022-09-15 DIAGNOSIS — R079 Chest pain, unspecified: Secondary | ICD-10-CM | POA: Diagnosis not present

## 2022-09-15 DIAGNOSIS — N281 Cyst of kidney, acquired: Secondary | ICD-10-CM | POA: Diagnosis not present

## 2022-09-15 LAB — CBC WITH DIFFERENTIAL/PLATELET
Abs Immature Granulocytes: 0.02 10*3/uL (ref 0.00–0.07)
Basophils Absolute: 0 10*3/uL (ref 0.0–0.1)
Basophils Relative: 1 %
Eosinophils Absolute: 0.1 10*3/uL (ref 0.0–0.5)
Eosinophils Relative: 1 %
HCT: 41.9 % (ref 36.0–46.0)
Hemoglobin: 14 g/dL (ref 12.0–15.0)
Immature Granulocytes: 0 %
Lymphocytes Relative: 15 %
Lymphs Abs: 1 10*3/uL (ref 0.7–4.0)
MCH: 31.5 pg (ref 26.0–34.0)
MCHC: 33.4 g/dL (ref 30.0–36.0)
MCV: 94.4 fL (ref 80.0–100.0)
Monocytes Absolute: 0.7 10*3/uL (ref 0.1–1.0)
Monocytes Relative: 10 %
Neutro Abs: 4.9 10*3/uL (ref 1.7–7.7)
Neutrophils Relative %: 73 %
Platelets: 195 10*3/uL (ref 150–400)
RBC: 4.44 MIL/uL (ref 3.87–5.11)
RDW: 13.6 % (ref 11.5–15.5)
WBC: 6.6 10*3/uL (ref 4.0–10.5)
nRBC: 0 % (ref 0.0–0.2)

## 2022-09-15 LAB — COMPREHENSIVE METABOLIC PANEL
ALT: 19 U/L (ref 0–44)
AST: 26 U/L (ref 15–41)
Albumin: 4.2 g/dL (ref 3.5–5.0)
Alkaline Phosphatase: 112 U/L (ref 38–126)
Anion gap: 13 (ref 5–15)
BUN: 26 mg/dL — ABNORMAL HIGH (ref 8–23)
CO2: 22 mmol/L (ref 22–32)
Calcium: 10.3 mg/dL (ref 8.9–10.3)
Chloride: 102 mmol/L (ref 98–111)
Creatinine, Ser: 1.53 mg/dL — ABNORMAL HIGH (ref 0.44–1.00)
GFR, Estimated: 33 mL/min — ABNORMAL LOW (ref >60)
Glucose, Bld: 137 mg/dL — ABNORMAL HIGH (ref 70–99)
Potassium: 4.2 mmol/L (ref 3.5–5.1)
Sodium: 137 mmol/L (ref 135–145)
Total Bilirubin: 0.9 mg/dL (ref 0.3–1.2)
Total Protein: 6.5 g/dL (ref 6.5–8.1)

## 2022-09-15 LAB — URINALYSIS, ROUTINE W REFLEX MICROSCOPIC
Bilirubin Urine: NEGATIVE
Glucose, UA: NEGATIVE mg/dL
Hgb urine dipstick: NEGATIVE
Ketones, ur: NEGATIVE mg/dL
Leukocytes,Ua: NEGATIVE
Nitrite: NEGATIVE
Protein, ur: NEGATIVE mg/dL
Specific Gravity, Urine: 1.011 (ref 1.005–1.030)
pH: 6 (ref 5.0–8.0)

## 2022-09-15 LAB — TROPONIN I (HIGH SENSITIVITY)
Troponin I (High Sensitivity): 9 ng/L (ref <18)
Troponin I (High Sensitivity): 10 ng/L (ref <18)

## 2022-09-15 LAB — LIPASE, BLOOD: Lipase: 28 U/L (ref 11–51)

## 2022-09-15 MED ORDER — MORPHINE SULFATE (PF) 2 MG/ML IV SOLN
2.0000 mg | INTRAVENOUS | Status: DC | PRN
  Administered 2022-09-15: 2 mg via INTRAVENOUS
  Filled 2022-09-15: qty 1

## 2022-09-15 NOTE — ED Provider Notes (Signed)
[facility] Provider Note   CSN: [id number] Arrival date & time: 09/15/22  1740     History  Chief Complaint  Patient presents with   Abdominal Pain   Cough    Briana [name] is a 88 y.o. female.  The history is provided by the patient and a friend. No language interpreter was used.  Abdominal Pain Pain location:  RUQ and LUQ Pain quality: aching and cramping   Pain radiates to:  Does not radiate Pain severity:  Severe Onset quality:  Sudden Timing:  Constant Progression:  Worsening Chronicity:  New Context: not diet changes and not eating   Relieved by:  Nothing Worsened by:  Nothing Ineffective treatments:  None tried Associated symptoms: cough   Risk factors: alcohol abuse   Risk factors: has not had multiple surgeries and no recent hospitalization   Cough      Home Medications Prior to Admission medications   Medication Sig Start Date End Date Taking? Authorizing Provider  acetaminophen (TYLENOL) 500 MG tablet Take 500 mg by mouth every 6 (six) hours as needed for moderate pain.    [provider]  albuterol (PROAIR HFA) 108 (90 Base) MCG/ACT inhaler Take  2 inhalations  15 minutes apart  every 4 hours  as needed for Wheezing 03/09/22   Unk Pinto, MD  amiodarone (PACERONE) 200 MG tablet Take 1 tablet by mouth once daily 05/25/22   Troy Sine, MD  apixaban Arne [place]) 2.5 MG TABS tablet Take 1 tablet by mouth twice daily 08/17/22   Deberah Pelton, NP  Carboxymeth-Glycerin-Polysorb (REFRESH DIGITAL OP) Place 1 drop into the right eye daily as needed (dry eyes).    [provider]  cholecalciferol (VITAMIN D3) 25 MCG (1000 UNIT) tablet Take 1,000 Units by mouth daily. M,W,F    [provider]  Cyanocobalamin (B-12 SL) Place 1 tablet under the tongue daily.    [provider]  diazepam (VALIUM) 2 MG tablet Take 1 tablet 3 - 4 x /day for Tremor                                     /                                  TAKE                    BY                     MOUTH 06/20/22   Unk Pinto, MD  diltiazem (CARDIZEM CD) 180 MG 24 hr capsule TAKE 1 CAPSULE BY MOUTH ONCE DAILY FOR HEART AND FOR BLOOD PRESSURE 09/02/22   Alycia Rossetti, NP  Ferrous Sulfate (IRON SLOW RELEASE PO) Take by mouth.    [provider]  furosemide (LASIX) 80 MG tablet Take 1 tablet by mouth twice daily 06/22/22   Troy Sine, MD  Melatonin 10 MG TABS Take 1 tablet by mouth daily.    [provider]  polyethylene glycol (MIRALAX / GLYCOLAX) 17 g packet Take 17 g by mouth daily. 03/11/21   Hosie Poisson, MD  potassium chloride SA (KLOR-CON) 20 MEQ tablet Take 1 tablet (20 mEq total) by mouth 2 (two) times daily. Patient not taking: Reported on 03/24/2022 03/20/21  Unk Pinto, MD  Pyridoxine HCl (VITAMIN B-6 PO) Take 1 capsule by mouth daily.    [provider]  vitamin C (ASCORBIC ACID) 500 MG tablet Take 500 mg by mouth daily.    [provider]  zinc gluconate 50 MG tablet Take 50 mg by mouth daily.    [provider]      Allergies    Accupril [quinapril hcl], Neosporin [neomycin-bacitracin zn-polymyx], Prednisone, Reglan [metoclopramide], Tramadol, and Adhesive [tape]    Review of Systems   Review of Systems  Respiratory:  Positive for cough.   Gastrointestinal:  Positive for abdominal pain.  All other systems reviewed and are negative.   Physical Exam Updated Vital Signs BP (!) 161/66   Pulse 60   Temp 98 F (36.7 C)   Resp 19   SpO2 90%  Physical Exam Vitals and nursing note reviewed.  Constitutional:      Appearance: She is well-developed.  HENT:     Head: Normocephalic.  Cardiovascular:     Rate and Rhythm: Normal rate and regular rhythm.  Pulmonary:     Effort: Pulmonary effort is normal.  Abdominal:     General: Bowel sounds are normal. There is no distension.     Palpations: Abdomen is soft.     Tenderness: There is  abdominal tenderness in the right upper quadrant and left upper quadrant.     Hernia: No hernia is present.  Musculoskeletal:        General: Normal range of motion.     Cervical back: Normal range of motion.  Skin:    General: Skin is warm.  Neurological:     General: No focal deficit present.     Mental Status: She is alert and oriented to person, place, and time.     ED Results / Procedures / Treatments   Labs (all labs ordered are listed, but only abnormal results are displayed) Labs Reviewed  COMPREHENSIVE METABOLIC PANEL - Abnormal; Notable for the following components:      Result Value   Glucose, Bld 137 (*)    BUN 26 (*)    Creatinine, Ser 1.53 (*)    GFR, Estimated 33 (*)    All other components within normal limits  CBC WITH DIFFERENTIAL/PLATELET  LIPASE, BLOOD  URINALYSIS, ROUTINE W REFLEX MICROSCOPIC  TROPONIN I (HIGH SENSITIVITY)  TROPONIN I (HIGH SENSITIVITY)    EKG EKG Interpretation  Date/Time:  Tuesday September 15 2022 19:57:23 EDT Ventricular Rate:  61 PR Interval:  217 QRS Duration: 120 QT Interval:  476 QTC Calculation: 480 R Axis:   265 Text Interpretation: Sinus rhythm Borderline prolonged PR interval Incomplete RBBB and LAFB Probable anteroseptal infarct, old Baseline wander Confirmed by Davonna Belling [phone]) on 09/15/2022 8:20:39 PM  Radiology CT ABDOMEN PELVIS WO CONTRAST  Result Date: 09/15/2022 CLINICAL DATA:  Acute abdominal pain. EXAM: CT ABDOMEN AND PELVIS WITHOUT CONTRAST TECHNIQUE: Multidetector CT imaging of the abdomen and pelvis was performed following the standard protocol without IV contrast. RADIATION DOSE REDUCTION: This exam was performed according to the departmental dose-optimization program which includes automated exposure control, adjustment of the mA and/or kV according to patient size and/or use of iterative reconstruction technique. COMPARISON:  CT pelvis 01/06/2014 FINDINGS: Lower chest: There is a calcified granuloma  in the right lower lobe. Heart is enlarged. Hepatobiliary: No focal liver abnormality is seen. No gallstones, gallbladder wall thickening, or biliary dilatation. Pancreas: Unremarkable. No pancreatic ductal dilatation or surrounding inflammatory changes. Spleen: There are calcified  granulomas in the spleen. Spleen is otherwise within normal limits. Adrenals/Urinary Tract: Low-density left adrenal nodule is favored as adenoma measuring up to 15 mm. Right adrenal gland is within normal limits. Bilateral renal cysts are present. The largest cyst is in the right kidney measuring 8.5 cm. No urinary tract calculi. No hydronephrosis. Bladder is within normal limits. Stomach/Bowel: There is a small to moderate-sized hiatal hernia. The stomach is otherwise within normal limits. Appendix is not seen. No evidence of bowel wall thickening, distention, or inflammatory changes. There is sigmoid and descending colon diverticulosis without evidence for acute diverticulitis. Vascular/Lymphatic: Aortic atherosclerosis. No enlarged abdominal or pelvic lymph nodes. Reproductive: The uterus is mildly enlarged and heterogeneous likely related to fibroid change. Adnexa are unremarkable. Other: Small fat containing right inguinal hernia. No abdominopelvic ascites. Musculoskeletal: Left hip arthroplasty is present. Bones are osteopenic. Degenerative changes affect the spine. There is a subcutaneous fluid collection lateral to the proximal femoral diaphysis measuring 2.5 x 2.0 x 3.2 cm. There is mild surrounding fat stranding. IMPRESSION: 1. No acute localizing process in the abdomen or pelvis. 2. Colonic diverticulosis without evidence for acute diverticulitis. 3. Small to moderate-sized hiatal hernia. 4. Subcutaneous fluid collection lateral to the proximal left femoral diaphysis with surrounding fat stranding. Findings may represent postoperative seroma or hematoma. Infection not excluded. 5. Fibroid uterus. 6. Low-density left adrenal  nodule is favored as adenoma measuring up to 15 mm. 7. Right Bosniak II benign renal cyst measuring 8.5 cm. No follow-up imaging is recommended. JACR 2018 Feb; 264-273, Management of the Incidental Renal Mass on CT, [employer] 2021; 814-848, Bosniak Classification of Cystic Renal Masses, Version 2019. Aortic Atherosclerosis (ICD10-I70.0). Electronically Signed   By: Ronney Asters M.D.   On: 09/15/2022 20:18   DG Chest 1 View  Result Date: 09/15/2022 CLINICAL DATA:  Cough and right-sided chest pain. EXAM: CHEST  1 VIEW COMPARISON:  01/24/2021 FINDINGS: The cardiac silhouette, mediastinal and hilar contours or within normal limits for age and AP projection. Stable tortuosity and calcification of the thoracic aorta. Chronic basilar scarring changes but no infiltrates or effusions. No pneumothorax. Stable right lower lobe calcified granuloma. IMPRESSION: Chronic basilar scarring changes.  No acute pulmonary findings. Electronically Signed   By: Marijo Sanes M.D.   On: 09/15/2022 19:53    Procedures Procedures    Medications Ordered in ED Medications  morphine (PF) 2 MG/ML injection 2 mg (2 mg Intravenous Given 09/15/22 1903)    ED Course/ Medical Decision Making/ A&P                           Medical Decision Making Pt reports she began having abdominal pain this evening.  Pt reports she had a similar pain in the past with diverticulitis   Amount and/or Complexity of Data Reviewed Independent Historian: friend    Details: Pt is here with a friend who helps provide history  External Data Reviewed: notes.    Details: Previous hospital notes reviewed  Labs: ordered. Decision-making details documented in ED Course. Radiology: ordered and independent interpretation performed. Decision-making details documented in ED Course.    Details: Ct adomen and pelvis.  No sbo  no acute abnormality   ECG/medicine tests: ordered and independent interpretation performed. Decision-making details documented  in ED Course.  Risk OTC drugs. Risk Details: Pt advised to follow up with Dr. Melford Aase,      Dr. Alvino Chapel in to see and examine pt.  Final Clinical Impression(s) / ED Diagnoses Final diagnoses:  Generalized abdominal pain    Rx / DC Orders ED Discharge Orders     None      An After Visit Summary was printed and given to the patient.    Sidney Ace 09/15/22 2231    Davonna Belling, MD 09/15/22 2241

## 2022-09-15 NOTE — Discharge Instructions (Addendum)
See your physician for recheck.  Return if any problems.

## 2022-09-15 NOTE — ED Notes (Signed)
Patient ambulatory to the bathroom.

## 2022-09-15 NOTE — ED Triage Notes (Signed)
Pt BIBA from home. Pt c/o upper abd pain. Pt has hx of diverticulitis. Pt states pain since 1500 today, worse with movement. Cough x 3-4 months. Last BM 1630 today- describes as shoestrings.   VSS with EMS.  178/90 20RR 98% RA 140 CBG

## 2022-09-16 ENCOUNTER — Ambulatory Visit: Payer: Medicare Other | Admitting: General Practice

## 2022-09-17 ENCOUNTER — Encounter: Payer: Self-pay | Admitting: General Practice

## 2022-09-17 ENCOUNTER — Ambulatory Visit: Payer: Medicare Other | Attending: General Practice | Admitting: General Practice

## 2022-09-17 VITALS — BP 118/62 | HR 62 | Wt 136.2 lb

## 2022-09-17 DIAGNOSIS — I5032 Chronic diastolic (congestive) heart failure: Secondary | ICD-10-CM

## 2022-09-17 DIAGNOSIS — I480 Paroxysmal atrial fibrillation: Secondary | ICD-10-CM

## 2022-09-17 DIAGNOSIS — N179 Acute kidney failure, unspecified: Secondary | ICD-10-CM | POA: Diagnosis not present

## 2022-09-17 DIAGNOSIS — I10 Essential (primary) hypertension: Secondary | ICD-10-CM | POA: Diagnosis not present

## 2022-09-17 NOTE — Patient Instructions (Signed)
Medication Instructions:  The current medical regimen is effective;  continue present plan and medications as directed. Please refer to the Current Medication list given to you today.  *If you need a refill on your cardiac medications before your next appointment, please call your pharmacy*   Lab Work: NONE If you have labs (blood work) drawn today and your tests are completely normal, you will receive your results only by: [place] (if you have MyChart) OR A paper copy in the mail If you have any lab test that is abnormal or we need to change your treatment, we will call you to review the results.   Testing/Procedures: NONE   Follow-Up: At [facility], you and your health needs are our priority.  As part of our continuing mission to provide you with exceptional heart care, we have created designated Provider Care Teams.  These Care Teams include your primary Cardiologist (physician) and Advanced Practice Providers (APPs -  Physician Assistants and Nurse Practitioners) who all work together to provide you with the care you need, when you need it.  We recommend signing up for the patient portal called "MyChart".  Sign up information is provided on this After Visit Summary.  MyChart is used to connect with patients for Virtual Visits (Telemedicine).  Patients are able to view lab/test results, encounter notes, upcoming appointments, etc.  Non-urgent messages can be sent to your provider as well.   To learn more about what you can do with MyChart, go to [email or web address].    Your next appointment:   6 month(s)  The format for your next appointment:   In Person  Provider:   Shelva Majestic, MD     Other Instructions PLEASE READ AND FOLLOW ATTACHED  SALTY 6  MAINTAIN PHYSICAL ACTIVITY  Important Information About Sugar

## 2022-09-24 ENCOUNTER — Ambulatory Visit (INDEPENDENT_AMBULATORY_CARE_PROVIDER_SITE_OTHER): Payer: Medicare Other | Admitting: Internal Medicine

## 2022-09-24 ENCOUNTER — Encounter: Payer: Self-pay | Admitting: Internal Medicine

## 2022-09-24 VITALS — BP 136/78 | HR 62 | Temp 98.2°F | Resp 17 | Ht 61.0 in | Wt 136.2 lb

## 2022-09-24 DIAGNOSIS — B351 Tinea unguium: Secondary | ICD-10-CM

## 2022-09-24 DIAGNOSIS — N183 Chronic kidney disease, stage 3 unspecified: Secondary | ICD-10-CM | POA: Diagnosis not present

## 2022-09-24 DIAGNOSIS — E785 Hyperlipidemia, unspecified: Secondary | ICD-10-CM

## 2022-09-24 DIAGNOSIS — E1169 Type 2 diabetes mellitus with other specified complication: Secondary | ICD-10-CM | POA: Diagnosis not present

## 2022-09-24 DIAGNOSIS — E1122 Type 2 diabetes mellitus with diabetic chronic kidney disease: Secondary | ICD-10-CM | POA: Diagnosis not present

## 2022-09-24 DIAGNOSIS — I700 Atherosclerosis of aorta: Secondary | ICD-10-CM | POA: Diagnosis not present

## 2022-09-24 DIAGNOSIS — E559 Vitamin D deficiency, unspecified: Secondary | ICD-10-CM

## 2022-09-24 DIAGNOSIS — I482 Chronic atrial fibrillation, unspecified: Secondary | ICD-10-CM

## 2022-09-24 DIAGNOSIS — Z79899 Other long term (current) drug therapy: Secondary | ICD-10-CM | POA: Diagnosis not present

## 2022-09-24 DIAGNOSIS — N1832 Chronic kidney disease, stage 3b: Secondary | ICD-10-CM

## 2022-09-24 DIAGNOSIS — I10 Essential (primary) hypertension: Secondary | ICD-10-CM | POA: Diagnosis not present

## 2022-09-24 MED ORDER — TERBINAFINE HCL 250 MG PO TABS: ORAL_TABLET | ORAL | 0 refills | Status: DC

## 2022-09-24 NOTE — Progress Notes (Signed)
Future Appointments  Date Time Provider Department  09/24/2022                6 mo ov  2:30 PM Unk Pinto, MD [facility]  03/11/2023                 cpe  3:00 PM Unk Pinto, MD [facility]  03/19/2023 11:00 AM Troy Sine, MD [facility]  06/14/2023  4:00 PM Alycia Rossetti, NP [facility]        This very nice 88 y.o.  WWF  with  HTN, ASHD/cAfib, HLD, T2_NIDDM  and Vitamin D Deficiency,  who presents for a Screening /Preventative Visit & comprehensive evaluation and management of multiple medical co-morbidities.  CTA Chest in Jan 2020 showed Aortic Atherosclerosis. Patient was hospitalized in Apr 2022 due GI bleed due to severe Sigmoid /Desc. colon Diverticular Dz  (on Eliquis).        Patient also expresses concerns re: a very thickened dystrophic  dark brown claw-like Rt great toe nail .         HTN predates circa  1996.  In Jan 2020, patient was dx'd with cAfib & Diastolic Ht failure & failed CV x 2.  In Mar 2022, she was hospitalized with CHF & AKI on CKD.  Patient has  CKD3b (GFR 33) & is also followed by Nephrologist Dr Harrie Jeans.  Patient is followed by Cardiology - Dr Ellouise Newer & [name], NP.  Patient's BP has been controlled at home and patient denies any cardiac symptoms as chest pain, palpitations, shortness of breath, dizziness or ankle swelling. Today's BP is at goal - 136/78 .        Patient's hyperlipidemia is not controlled with diet and medications. Patient denies myalgias or other medication SE's. Last lipids were not at goal :  Lab Results  Component Value Date   CHOL 205 (H) 06/22/2022   HDL 67 06/22/2022   LDLCALC 120 (H) 06/22/2022   TRIG 79 06/22/2022   CHOLHDL 3.1 06/22/2022         Patient has hx/o T2_NIDDM  (A1c 6.6% /2011) w/CKD3b (GFR  42) and she has been attempting control with diet.  Patient denies reactive hypoglycemic symptoms, visual blurring, diabetic polys or paresthesias. Last A1c was not at goal :  Lab Results   Component Value Date   HGBA1C 6.4 (H) 06/22/2022         Patient has a Vitamin B12 Deficiency and also has Vitamin D Deficiency (23" /2008) and last Vitamin D was at goal :  Lab Results  Component Value Date   VD25OH 102 (H) 03/05/2021       Current Outpatient Medications on File Prior to Visit  Medication Sig   acetaminophen 500 MG tablet Take every 6 (six) hours as needed for moderate pain.   amiodarone (PACERONE) 200 MG tablet Take 1 tablet once daily   apixaban (ELIQUIS) 2.5 MG TABS tablet Take 1 tablet twice daily   VITAMIN D 1,000 Units   Take daily.   Cyanocobalamin (B-12 SL) Place 1 tablet under the tongue daily.   diazepam 2 MG tablet Take  1 tablet  4 x /day  for Tremor     diltiazem  CD) 180 MG  Take  1 capsule  Daily     furosemide  80 MG tablet Take 1 tablet twice daily   Melatonin 10 MG TABS Take 1 tablet daily.   polyethylene glycol 17 g packet Take 1daily.  potassium chloride 20 MEQ tablet Take 10 mEq daily   PROAIR HFA inhaler Inhale 2 puffs  every 6 hours as needed   Pyridoxine HCl (VITAMIN B-6) Take 1 capsule  daily.   vitamin C  500 MG tablet Take  daily.   zinc 50 MG tablet Take daily.     Allergies  Allergen Reactions   Accupril [Quinapril Hcl] Cough   Neosporin     Prednisone Agitated and shaky   Reglan [Metoclopramide] tremors   Tramadol Nausea And Vomiting   Adhesive [Tape] Rash     Past Medical History:  Diagnosis Date   Acute on chronic diastolic (congestive) heart failure ([place]) 11/21/2018   Cancer (HCC)    skin cancer   DDD (degenerative disc disease)    Diverticula of colon    Diverticulitis    DJD (degenerative joint disease)    GERD (gastroesophageal reflux disease)    takes ranitidine   Heart murmur    History of hiatal hernia    History of pneumonia    Hx of irritable bowel syndrome    Hyperlipidemia    Hypertension    Occasional tremors    Osteoporosis    Pre-diabetes    Varicose veins    Vitamin D deficiency       Health Maintenance  Topic Date Due   COVID-19 Vaccine (1) Never done   Zoster Vaccines- Shingrix (1 of 2) Never done   HEMOGLOBIN A1C  02/10/2022   FOOT EXAM  03/04/2022   OPHTHALMOLOGY EXAM  03/05/2022   INFLUENZA VACCINE  06/16/2022   URINE MICROALBUMIN  08/13/2022   TETANUS/TDAP  09/04/2025   Pneumonia Vaccine 8+ Years old  Completed   DEXA SCAN  Completed   HPV VACCINES  Aged Out     Immunization History  Administered Date(s) Administered   DT  09/05/2015   DTaP 11/16/2004   Influenza Whole 08/30/2013   Influenza, High Dose  08/10/2016, 11/20/2019, 09/10/2020   Pneumococcal -13 10/19/2016   Pneumococcal -23 11/16/2001   Zoster, Live 06/01/2013    Last Colon - 01/21/2010 - Dr Fuller Plan - recc 10 yr f/u Mar 2022 - Aged out Flex Sig  03/08/2021   -  Severe Diverticular Dz.   Last MGM - 03/04/2021 & scheduled 03/24/2022   Past Surgical History:  Procedure Laterality Date   APPENDECTOMY     BREAST EXCISIONAL BIOPSY Right    BREAST SURGERY Right    fluid drained from breast   CARDIOVERSION N/A 12/20/2018   Procedure: CARDIOVERSION;  Surgeon: Buford Dresser, MD;  [name];  Service: Cardiovascular;  Laterality: N/A;   CARDIOVERSION N/A 06/16/2019   Procedure: CARDIOVERSION;  Surgeon: Fay Records, MD;  Location: [place];  Service: Cardiovascular;  Laterality: N/A;   COLONOSCOPY     ESOPHAGOGASTRODUODENOSCOPY     FLEXIBLE SIGMOIDOSCOPY N/A 03/09/2021   Procedure: FLEXIBLE SIGMOIDOSCOPY;  Surgeon: Mauri Pole, MD;  Location: [place];  Service: Endoscopy;  Laterality: N/A;   HEMOSTASIS CLIP PLACEMENT  03/09/2021   Procedure: HEMOSTASIS CLIP PLACEMENT;  Surgeon: Mauri Pole, MD;  Location: [place];  Service: Endoscopy;;   JOINT REPLACEMENT Left    left hip   LUMBAR LAMINECTOMY/DECOMPRESSION MICRODISCECTOMY N/A 06/05/2015   Procedure: L3-L5 DECOMPRESSION ;  Surgeon: Melina Schools, MD;  Location: [place];  Service:  Orthopedics;  Laterality: N/A;   OPEN REDUCTION INTERNAL FIXATION (ORIF) DISTAL RADIAL FRACTURE Right 01/10/2014   Procedure: OPEN REDUCTION INTERNAL FIXATION (ORIF) DISTAL RADIAL FRACTURE;  Surgeon: Josph Macho  Natasha Bence, MD;  Location: [place];  Service: Orthopedics;  Laterality: Right;   SPINAL CORD DECOMPRESSION  06/05/2015   L3 L 5   THYROID SURGERY     TONSILLECTOMY     TUBAL LIGATION     varicose veins stripped     WRIST FRACTURE SURGERY Bilateral      Family History  Problem Relation Age of Onset   Hypertension Mother    Hyperlipidemia Mother    Heart disease Sister    Cancer Brother        prostate   Colon cancer Neg Hx    Esophageal cancer Neg Hx    Pancreatic cancer Neg Hx    Stomach cancer Neg Hx    Liver disease Neg Hx      Social History   Tobacco Use   Smoking status: Never   Smokeless tobacco: Never  Substance Use Topics   Alcohol use: No   Drug use: No      ROS Constitutional: Denies fever, chills, weight loss/gain, headaches, insomnia,  night sweats, and change in appetite. Does c/o fatigue. Eyes: Denies redness, blurred vision, diplopia, discharge, itchy, watery eyes.  ENT: Denies discharge, congestion, post nasal drip, epistaxis, sore throat, earache, hearing loss, dental pain, Tinnitus, Vertigo, Sinus pain, snoring.  Cardio: Denies chest pain, palpitations, irregular heartbeat, syncope, dyspnea, diaphoresis, orthopnea, PND, claudication, edema Respiratory: denies cough, dyspnea, DOE, pleurisy, hoarseness, laryngitis, wheezing.  Gastrointestinal: Denies dysphagia, heartburn, reflux, water brash, pain, cramps, nausea, vomiting, bloating, diarrhea, constipation, hematemesis, melena, hematochezia, jaundice, hemorrhoids Genitourinary: Denies dysuria, frequency, urgency, nocturia, hesitancy, discharge, hematuria, flank pain Breast: Breast lumps, nipple discharge, bleeding.  Musculoskeletal: Denies arthralgia, myalgia, stiffness, Jt. Swelling, pain, limp, and  strain/sprain. Denies falls. Skin: Denies puritis, rash, hives, warts, acne, eczema, changing in skin lesion Neuro: No weakness, tremor, incoordination, spasms, paresthesia, pain Psychiatric: Denies confusion, memory loss, sensory loss. Denies Depression. Endocrine: Denies change in weight, skin, hair change, nocturia, and paresthesia, diabetic polys, visual blurring, hyper / hypo glycemic episodes.  Heme/Lymph: No excessive bleeding, bruising, enlarged lymph nodes.  Physical Exam  BP 136/78   Pulse 62   Temp 98.2 F (36.8 C)   Resp 17   Ht '5\' 1"'$  (1.549 m)   Wt 136 lb 3.2 oz (61.8 kg)   SpO2 95%   BMI 25.73 kg/m   General Appearance:  older, well groomed and in no apparent distress.  Eyes: PERRLA, EOMs, conjunctiva no swelling or erythema, normal fundi and vessels. Sinuses: No frontal/maxillary tenderness ENT/Mouth: EACs patent / TMs  nl. Nares clear without erythema, swelling, mucoid exudates. Oral hygiene is good. No erythema, swelling, or exudate. Tongue normal, non-obstructing. Tonsils not swollen or erythematous. Hearing normal.  Neck: Supple, thyroid not palpable. No bruits, nodes or JVD. Respiratory: Respiratory effort normal.  BS equal and clear bilateral without rales, rhonci, wheezing or stridor. Cardio: Heart sounds are normal with regular rate and rhythm and no murmurs, rubs or gallops. Peripheral pulses are normal and equal bilaterally without edema. No aortic or femoral bruits. Chest: symmetric with normal excursions and percussion. Breasts: Deferred to upcoming MGM. Abdomen: Flat, soft with bowel sounds active. Nontender, no guarding, rebound, hernias, masses, or organomegaly.  Lymphatics: Non tender without lymphadenopathy.  Musculoskeletal: Full ROM all peripheral extremities, joint stability, 5/5 strength, and normal gait. Skin: Warm and dry without rashes, lesions, cyanosis or clubbing.   Normal Hip ROM bilateral.             Very thickened dystrophic  dark  brown  claw-like Rt great toe nail .  Neuro: Cranial nerves intact, reflexes equal bilaterally. Normal muscle tone, no cerebellar symptoms. Sensation intact.  Pysch: Alert and oriented X 3, normal affect, Insight and Judgment appropriate.    Assessment and Plan  1. Essential hypertension  - CBC with Differential/Platelet - TSH - COMPLETE METABOLIC PANEL WITH GFR - Magnesium  2. Hyperlipidemia associated with type 2 diabetes mellitus ([place])  - Lipid panel  3. Controlled type 2 diabetes mellitus with stage 3 chronic  kidney disease, without long-term current use of insulin (HCC)  - Hemoglobin A1c - Insulin, random  4. Chronic atrial fibrillation (HCC)  - TSH  5. Vitamin D deficiency  - VITAMIN D 25 Hydroxy   6. Type 2 diabetes mellitus with stage 3b chronic kidney  disease, without long-term current use of insulin (HCC)  - Hemoglobin A1c - Insulin, random  7. Aortic atherosclerosis ([place]) by Chest CTA on 11/21/2018.   - Lipid panel  8. Medication management  - CBC with Differential/Platelet - TSH - Hemoglobin A1c - Insulin, random - VITAMIN D 25 Hydroxy  - COMPLETE METABOLIC PANEL WITH GFR - Magnesium - Lipid panel          Patient was counseled in prudent diet to achieve/maintain BMI less than 25 for weight control, BP monitoring, regular exercise and medications. Given Rx for Lamisil for Onychomycosis of Rt great toenail.  Discussed med's effects and SE's. Screening labs and tests as requested with regular follow-up as recommended. Over 40 minutes of exam, counseling, chart review and high complex critical decision making was performed.   Kirtland Bouchard, MD

## 2022-09-24 NOTE — Patient Instructions (Signed)

## 2022-09-25 DIAGNOSIS — I129 Hypertensive chronic kidney disease with stage 1 through stage 4 chronic kidney disease, or unspecified chronic kidney disease: Secondary | ICD-10-CM | POA: Diagnosis not present

## 2022-09-25 DIAGNOSIS — E1122 Type 2 diabetes mellitus with diabetic chronic kidney disease: Secondary | ICD-10-CM | POA: Diagnosis not present

## 2022-09-25 DIAGNOSIS — E876 Hypokalemia: Secondary | ICD-10-CM | POA: Diagnosis not present

## 2022-09-25 DIAGNOSIS — I5032 Chronic diastolic (congestive) heart failure: Secondary | ICD-10-CM | POA: Diagnosis not present

## 2022-09-25 DIAGNOSIS — I480 Paroxysmal atrial fibrillation: Secondary | ICD-10-CM | POA: Diagnosis not present

## 2022-09-25 DIAGNOSIS — N2581 Secondary hyperparathyroidism of renal origin: Secondary | ICD-10-CM | POA: Diagnosis not present

## 2022-09-25 DIAGNOSIS — N1832 Chronic kidney disease, stage 3b: Secondary | ICD-10-CM | POA: Diagnosis not present

## 2022-09-25 LAB — COMPLETE METABOLIC PANEL WITH GFR
AG Ratio: 2.1 (calc) (ref 1.0–2.5)
ALT: 14 U/L (ref 6–29)
AST: 17 U/L (ref 10–35)
Albumin: 4.5 g/dL (ref 3.6–5.1)
Alkaline phosphatase (APISO): 132 U/L (ref 37–153)
BUN/Creatinine Ratio: 16 (calc) (ref 6–22)
BUN: 23 mg/dL (ref 7–25)
CO2: 29 mmol/L (ref 20–32)
Calcium: 10 mg/dL (ref 8.6–10.4)
Chloride: 104 mmol/L (ref 98–110)
Creat: 1.43 mg/dL — ABNORMAL HIGH (ref 0.60–0.95)
Globulin: 2.1 g/dL (calc) (ref 1.9–3.7)
Glucose, Bld: 108 mg/dL — ABNORMAL HIGH (ref 65–99)
Potassium: 4.1 mmol/L (ref 3.5–5.3)
Sodium: 141 mmol/L (ref 135–146)
Total Bilirubin: 0.7 mg/dL (ref 0.2–1.2)
Total Protein: 6.6 g/dL (ref 6.1–8.1)
eGFR: 36 mL/min/{1.73_m2} — ABNORMAL LOW (ref >=60)

## 2022-09-25 LAB — CBC WITH DIFFERENTIAL/PLATELET
Absolute Monocytes: 634 cells/uL (ref 200–950)
Basophils Absolute: 49 cells/uL (ref 0–200)
Basophils Relative: 0.8 %
Eosinophils Absolute: 61 cells/uL (ref 15–500)
Eosinophils Relative: 1 %
HCT: 40.4 % (ref 35.0–45.0)
Hemoglobin: 13.7 g/dL (ref 11.7–15.5)
Lymphs Abs: 1031 cells/uL (ref 850–3900)
MCH: 31.8 pg (ref 27.0–33.0)
MCHC: 33.9 g/dL (ref 32.0–36.0)
MCV: 93.7 fL (ref 80.0–100.0)
MPV: 11.4 fL (ref 7.5–12.5)
Monocytes Relative: 10.4 %
Neutro Abs: 4325 cells/uL (ref 1500–7800)
Neutrophils Relative %: 70.9 %
Platelets: 188 10*3/uL (ref 140–400)
RBC: 4.31 10*6/uL (ref 3.80–5.10)
RDW: 13 % (ref 11.0–15.0)
Total Lymphocyte: 16.9 %
WBC: 6.1 10*3/uL (ref 3.8–10.8)

## 2022-09-25 LAB — HEMOGLOBIN A1C
Hgb A1c MFr Bld: 6.6 % of total Hgb — ABNORMAL HIGH (ref <5.7)
Mean Plasma Glucose: 143 mg/dL
eAG (mmol/L): 7.9 mmol/L

## 2022-09-25 LAB — LIPID PANEL
Cholesterol: 195 mg/dL (ref <200)
HDL: 66 mg/dL (ref >=50)
LDL Cholesterol (Calc): 110 mg/dL (calc) — ABNORMAL HIGH
Non-HDL Cholesterol (Calc): 129 mg/dL (calc) (ref <130)
Total CHOL/HDL Ratio: 3 (calc) (ref <5.0)
Triglycerides: 96 mg/dL (ref <150)

## 2022-09-25 LAB — MAGNESIUM: Magnesium: 2.3 mg/dL (ref 1.5–2.5)

## 2022-09-25 LAB — TSH: TSH: 0.91 mIU/L (ref 0.40–4.50)

## 2022-09-25 LAB — INSULIN, RANDOM: Insulin: 6.4 u[IU]/mL

## 2022-09-25 LAB — VITAMIN D 25 HYDROXY (VIT D DEFICIENCY, FRACTURES): Vit D, 25-Hydroxy: 37 ng/mL (ref 30–100)

## 2022-09-26 ENCOUNTER — Other Ambulatory Visit: Payer: Self-pay | Admitting: Internal Medicine

## 2022-09-26 NOTE — Progress Notes (Signed)
<><><><><><><><><><><><><><><><><><><><><><><><><><><><><><><><><> <><><><><><><><><><><><><><><><><><><><><><><><><><><><><><><><><>  -   A1c = 6.6% is still too high  ( Goal is less than 5.8%) , So sugars too high   So need stricter diet & weight loss   - Avoid Sweets, Candy & White Stuff   - White Rice, White Potatoes, White Flour  - Breads &  Pasta  <><><><><><><><><><><><><><><><><><><><><><><><><><><><><><><><><> <><><><><><><><><><><><><><><><><><><><><><><><><><><><><><><><><>  -  Kidney functions stable <><><><><><><><><><><><><><><><><><><><><><><><><><><><><><><><><>  - Total   Chol = 195 - Great, but   - Bad LDL Chol = 110 is too high ( goal is less than 70 )  - Recc stricter diet    - Cholesterol only comes from animal sources                                                                         - ie. meat, dairy, egg yolks  - Eat all the vegetables you want.  - Avoid Meat, Avoid Meat,  Avoid Meat                                                       - especially Red Meat - Beef AND Pork   - Avoid cheese & dairy - milk & ice cream.     - Cheese is the most concentrated form of trans-fats which                                                        is the worst thing to clog up our arteries.   - Veggie cheese is OK which can be found in the fresh produce section at                                                                    [employer] or [employer] or [employer] <><><><><><><><><><><><><><><><><><><><><><><><><><><><><><><><><>  - Vitamin D = 37 is very very low !  Please take Vitamin D 5,000 unit capsule Daily <><><><><><><><><><><><><><><><><><><><><><><><><><><><><><><><><>  - All Else - CBC - Electrolytes - Liver - Magnesium & Thyroid    - all  Normal / OK <><><><><><><><><><><><><><><><><><><><><><><><><><><><><><><><><>

## 2022-09-27 ENCOUNTER — Encounter: Payer: Self-pay | Admitting: Internal Medicine

## 2022-09-30 ENCOUNTER — Telehealth: Payer: Self-pay

## 2022-09-30 NOTE — Telephone Encounter (Signed)
     Patient  visit on 09/15/2022  at [facility] was for Generalized abdominal pain.  Have you been able to follow up with your primary care physician? Patient saw her PCP 09/24/2022.  The patient was or was not able to obtain any needed medicine or equipment. No medication prescribed.  Are there diet recommendations that you are having difficulty following? No  Patient expresses understanding of discharge instructions and education provided has no other needs at this time.    [facility] Guide   [email or web address]  ?? [phone]   Website: [email or web address]  [email or web address]

## 2022-10-14 DIAGNOSIS — C44629 Squamous cell carcinoma of skin of left upper limb, including shoulder: Secondary | ICD-10-CM | POA: Diagnosis not present

## 2022-10-14 DIAGNOSIS — Z85828 Personal history of other malignant neoplasm of skin: Secondary | ICD-10-CM | POA: Diagnosis not present

## 2022-10-14 DIAGNOSIS — L820 Inflamed seborrheic keratosis: Secondary | ICD-10-CM | POA: Diagnosis not present

## 2022-10-26 NOTE — Progress Notes (Unsigned)
Assessment and Plan:  There are no diagnoses linked to this encounter.    Further disposition pending results of labs. Discussed med's effects and SE's.   Over 30 minutes of exam, counseling, chart review, and critical decision making was performed.   Future Appointments  Date Time Provider [place]  10/27/2022 10:00 AM Alycia Rossetti, NP [facility] None  03/11/2023  3:00 PM Unk Pinto, MD [facility] None  03/19/2023 11:00 AM Troy Sine, MD [facility] None  06/14/2023  4:00 PM Alycia Rossetti, NP [facility] None    ------------------------------------------------------------------------------------------------------------------   HPI There were no vitals taken for this visit. 88 y.o.female presents for  Past Medical History:  Diagnosis Date   Acute on chronic diastolic (congestive) heart failure ([place]) 11/21/2018   Cancer ([place])    skin cancer   DDD (degenerative disc disease)    Diverticula of colon    Diverticulitis    DJD (degenerative joint disease)    GERD (gastroesophageal reflux disease)    takes ranitidine   Heart murmur    History of hiatal hernia    History of pneumonia    Hx of irritable bowel syndrome    Hyperlipidemia    Hypertension    Occasional tremors    Osteoporosis    Pre-diabetes    Varicose veins    Vitamin D deficiency      Allergies  Allergen Reactions   Accupril [Quinapril Hcl] Cough   Neosporin [Neomycin-Bacitracin Zn-Polymyx]     unknown   Prednisone     Agitated and shaky   Reglan [Metoclopramide] Other (See Comments)    tremors   Tramadol Nausea And Vomiting   Adhesive [Tape] Rash    Current Outpatient Medications on File Prior to Visit  Medication Sig   acetaminophen (TYLENOL) 500 MG tablet Take 500 mg by mouth every 6 (six) hours as needed for moderate pain.   albuterol (PROAIR HFA) 108 (90 Base) MCG/ACT inhaler Take  2 inhalations  15 minutes apart  every 4 hours  as needed for Wheezing   amiodarone  (PACERONE) 200 MG tablet Take 1 tablet by mouth once daily   apixaban (ELIQUIS) 2.5 MG TABS tablet Take 1 tablet by mouth twice daily   Carboxymeth-Glycerin-Polysorb (REFRESH DIGITAL OP) Place 1 drop into the right eye daily as needed (dry eyes).   cholecalciferol (VITAMIN D3) 25 MCG (1000 UNIT) tablet Take 1,000 Units by mouth daily. M,W,F   Cyanocobalamin (B-12 SL) Place 1 tablet under the tongue daily.   diazepam (VALIUM) 2 MG tablet Take 1 tablet 3 - 4 x /day for Tremor                                     /                                 TAKE                    BY                     MOUTH   diltiazem (CARDIZEM CD) 180 MG 24 hr capsule TAKE 1 CAPSULE BY MOUTH ONCE DAILY FOR HEART AND FOR BLOOD PRESSURE   Ferrous Sulfate (IRON SLOW RELEASE PO) Take by mouth.   furosemide (LASIX) 80 MG tablet Take 1 tablet by  mouth twice daily   Melatonin 10 MG TABS Take 1 tablet by mouth daily.   polyethylene glycol (MIRALAX / GLYCOLAX) 17 g packet Take 17 g by mouth daily.   potassium chloride SA (KLOR-CON) 20 MEQ tablet Take 1 tablet (20 mEq total) by mouth 2 (two) times daily.   terbinafine (LAMISIL) 250 MG tablet Take 1 tablet Daily for Nail Fungus   vitamin B-12 (CYANOCOBALAMIN) 50 MCG tablet Take 50 mcg by mouth daily.   vitamin C (ASCORBIC ACID) 500 MG tablet Take 500 mg by mouth daily.   zinc gluconate 50 MG tablet Take 50 mg by mouth daily.   No current facility-administered medications on file prior to visit.    ROS: all negative except above.   Physical Exam:  There were no vitals taken for this visit.  General Appearance: Well nourished, in no apparent distress. Eyes: PERRLA, EOMs, conjunctiva no swelling or erythema Sinuses: No Frontal/maxillary tenderness ENT/Mouth: Ext aud canals clear, TMs without erythema, bulging. No erythema, swelling, or exudate on post pharynx.  Tonsils not swollen or erythematous. Hearing normal.  Neck: Supple, thyroid normal.  Respiratory: Respiratory effort  normal, BS equal bilaterally without rales, rhonchi, wheezing or stridor.  Cardio: RRR with no MRGs. Brisk peripheral pulses without edema.  Abdomen: Soft, + BS.  Non tender, no guarding, rebound, hernias, masses. Lymphatics: Non tender without lymphadenopathy.  Musculoskeletal: Full ROM, 5/5 strength, normal gait.  Skin: Warm, dry without rashes, lesions, ecchymosis.  Neuro: Cranial nerves intact. Normal muscle tone, no cerebellar symptoms. Sensation intact.  Psych: Awake and oriented X 3, normal affect, Insight and Judgment appropriate.     Alycia Rossetti, NP 12:31 PM [facility]

## 2022-10-27 ENCOUNTER — Ambulatory Visit (INDEPENDENT_AMBULATORY_CARE_PROVIDER_SITE_OTHER): Payer: Medicare Other | Admitting: Nurse Practitioner

## 2022-10-27 ENCOUNTER — Telehealth: Payer: Self-pay

## 2022-10-27 ENCOUNTER — Encounter: Payer: Self-pay | Admitting: Nurse Practitioner

## 2022-10-27 VITALS — BP 132/78 | HR 68 | Temp 97.3°F | Ht 61.0 in | Wt 138.0 lb

## 2022-10-27 DIAGNOSIS — R350 Frequency of micturition: Secondary | ICD-10-CM

## 2022-10-27 DIAGNOSIS — I10 Essential (primary) hypertension: Secondary | ICD-10-CM | POA: Diagnosis not present

## 2022-10-27 MED ORDER — NITROFURANTOIN MONOHYD MACRO 100 MG PO CAPS: 100.0000 mg | ORAL_CAPSULE | Freq: Two times a day (BID) | ORAL | 0 refills | Status: AC

## 2022-10-27 NOTE — Patient Instructions (Signed)
Urinary Tract Infection, Adult A urinary tract infection (UTI) is an infection of any part of the urinary tract. The urinary tract includes: The kidneys. The ureters. The bladder. The urethra. These organs make, store, and get rid of pee (urine) in the body. What are the causes? This infection is caused by germs (bacteria) in your genital area. These germs grow and cause swelling (inflammation) of your urinary tract. What increases the risk? The following factors may make you more likely to develop this condition: Using a small, thin tube (catheter) to drain pee. Not being able to control when you pee or poop (incontinence). Being female. If you are female, these things can increase the risk: Using these methods to prevent pregnancy: A medicine that kills sperm (spermicide). A device that blocks sperm (diaphragm). Having low levels of a female hormone (estrogen). Being pregnant. You are more likely to develop this condition if: You have genes that add to your risk. You are sexually active. You take antibiotic medicines. You have trouble peeing because of: A prostate that is bigger than normal, if you are female. A blockage in the part of your body that drains pee from the bladder. A kidney stone. A nerve condition that affects your bladder. Not getting enough to drink. Not peeing often enough. You have other conditions, such as: Diabetes. A weak disease-fighting system (immune system). Sickle cell disease. Gout. Injury of the spine. What are the signs or symptoms? Symptoms of this condition include: Needing to pee right away. Peeing small amounts often. Pain or burning when peeing. Blood in the pee. Pee that smells bad or not like normal. Trouble peeing. Pee that is cloudy. Fluid coming from the vagina, if you are female. Pain in the belly or lower back. Other symptoms include: Vomiting. Not feeling hungry. Feeling mixed up (confused). This may be the first symptom in  older adults. Being tired and grouchy (irritable). A fever. Watery poop (diarrhea). How is this treated? Taking antibiotic medicine. Taking other medicines. Drinking enough water. In some cases, you may need to see a specialist. Follow these instructions at home:  Medicines Take over-the-counter and prescription medicines only as told by your doctor. If you were prescribed an antibiotic medicine, take it as told by your doctor. Do not stop taking it even if you start to feel better. General instructions Make sure you: Pee until your bladder is empty. Do not hold pee for a long time. Empty your bladder after sex. Wipe from front to back after peeing or pooping if you are a female. Use each tissue one time when you wipe. Drink enough fluid to keep your pee pale yellow. Keep all follow-up visits. Contact a doctor if: You do not get better after 1-2 days. Your symptoms go away and then come back. Get help right away if: You have very bad back pain. You have very bad pain in your lower belly. You have a fever. You have chills. You feeling like you will vomit or you vomit. Summary A urinary tract infection (UTI) is an infection of any part of the urinary tract. This condition is caused by germs in your genital area. There are many risk factors for a UTI. Treatment includes antibiotic medicines. Drink enough fluid to keep your pee pale yellow. This information is not intended to replace advice given to you by your health care provider. Make sure you discuss any questions you have with your health care provider. Document Revised: 06/14/2020 Document Reviewed: 06/14/2020 Elsevier Patient Education    2023 Elsevier Inc.  

## 2022-10-27 NOTE — Telephone Encounter (Signed)
LM-10/27/22-Chart review started. Reviewing OV, Consults, Hospital visits, Labs and medication changes.  Chart review complete. Called pt. To complete a General Review Call. Unable to reach pt. [place] X1.  Total time spent: 12 min.

## 2022-10-28 NOTE — Telephone Encounter (Signed)
LM-10/28/22-Called pt. To complete a General Review Call. Spoke to pts. Daughter [name] who informed me the pt. Was unavailable and not feeling well today and requested pt. To call back in a few days when she is feeling better. Agreed to have pt. Call me when she is feelings better.   Total time spent: 5 min.

## 2022-10-29 LAB — URINALYSIS, ROUTINE W REFLEX MICROSCOPIC
Bilirubin Urine: NEGATIVE
Glucose, UA: NEGATIVE
Hgb urine dipstick: NEGATIVE
Ketones, ur: NEGATIVE
Leukocytes,Ua: NEGATIVE
Nitrite: NEGATIVE
Protein, ur: NEGATIVE
Specific Gravity, Urine: 1.008 (ref 1.001–1.035)
pH: 5.5 (ref 5.0–8.0)

## 2022-10-29 LAB — URINE CULTURE
MICRO NUMBER:: 14304828
SPECIMEN QUALITY:: ADEQUATE

## 2022-11-02 NOTE — Progress Notes (Unsigned)
Assessment and Plan:  There are no diagnoses linked to this encounter.    Further disposition pending results of labs. Discussed med's effects and SE's.   Over 30 minutes of exam, counseling, chart review, and critical decision making was performed.   Future Appointments  Date Time Provider [place]  11/03/2022  3:00 PM Alycia Rossetti, NP [facility] None  03/11/2023  3:00 PM Unk Pinto, MD [facility] None  03/19/2023 11:00 AM Troy Sine, MD [facility] None  06/14/2023  4:00 PM Alycia Rossetti, NP [facility] None    ------------------------------------------------------------------------------------------------------------------   HPI There were no vitals taken for this visit. 88 y.o.female presents for  Past Medical History:  Diagnosis Date   Acute on chronic diastolic (congestive) heart failure ([place]) 11/21/2018   Cancer ([place])    skin cancer   DDD (degenerative disc disease)    Diverticula of colon    Diverticulitis    DJD (degenerative joint disease)    GERD (gastroesophageal reflux disease)    takes ranitidine   Heart murmur    History of hiatal hernia    History of pneumonia    Hx of irritable bowel syndrome    Hyperlipidemia    Hypertension    Occasional tremors    Osteoporosis    Pre-diabetes    Varicose veins    Vitamin D deficiency      Allergies  Allergen Reactions   Accupril [Quinapril Hcl] Cough   Neosporin [Neomycin-Bacitracin Zn-Polymyx]     unknown   Prednisone     Agitated and shaky   Reglan [Metoclopramide] Other (See Comments)    tremors   Tramadol Nausea And Vomiting   Adhesive [Tape] Rash    Current Outpatient Medications on File Prior to Visit  Medication Sig   acetaminophen (TYLENOL) 500 MG tablet Take 500 mg by mouth every 6 (six) hours as needed for moderate pain.   albuterol (PROAIR HFA) 108 (90 Base) MCG/ACT inhaler Take  2 inhalations  15 minutes apart  every 4 hours  as needed for Wheezing   amiodarone  (PACERONE) 200 MG tablet Take 1 tablet by mouth once daily   apixaban (ELIQUIS) 2.5 MG TABS tablet Take 1 tablet by mouth twice daily   Carboxymeth-Glycerin-Polysorb (REFRESH DIGITAL OP) Place 1 drop into the right eye daily as needed (dry eyes).   cholecalciferol (VITAMIN D3) 25 MCG (1000 UNIT) tablet Take 1,000 Units by mouth daily. M,W,F   Cyanocobalamin (B-12 SL) Place 1 tablet under the tongue daily.   diazepam (VALIUM) 2 MG tablet Take 1 tablet 3 - 4 x /day for Tremor                                     /                                 TAKE                    BY                     MOUTH   diltiazem (CARDIZEM CD) 180 MG 24 hr capsule TAKE 1 CAPSULE BY MOUTH ONCE DAILY FOR HEART AND FOR BLOOD PRESSURE   Ferrous Sulfate (IRON SLOW RELEASE PO) Take by mouth.   furosemide (LASIX) 80 MG tablet Take 1 tablet  by mouth twice daily   Melatonin 10 MG TABS Take 1 tablet by mouth daily.   nitrofurantoin, macrocrystal-monohydrate, (MACROBID) 100 MG capsule Take 1 capsule (100 mg total) by mouth 2 (two) times daily for 7 days.   polyethylene glycol (MIRALAX / GLYCOLAX) 17 g packet Take 17 g by mouth daily.   potassium chloride SA (KLOR-CON) 20 MEQ tablet Take 1 tablet (20 mEq total) by mouth 2 (two) times daily.   terbinafine (LAMISIL) 250 MG tablet Take 1 tablet Daily for Nail Fungus   vitamin B-12 (CYANOCOBALAMIN) 50 MCG tablet Take 50 mcg by mouth daily.   vitamin C (ASCORBIC ACID) 500 MG tablet Take 500 mg by mouth daily.   zinc gluconate 50 MG tablet Take 50 mg by mouth daily.   No current facility-administered medications on file prior to visit.    ROS: all negative except above.   Physical Exam:  There were no vitals taken for this visit.  General Appearance: Well nourished, in no apparent distress. Eyes: PERRLA, EOMs, conjunctiva no swelling or erythema Sinuses: No Frontal/maxillary tenderness ENT/Mouth: Ext aud canals clear, TMs without erythema, bulging. No erythema, swelling, or  exudate on post pharynx.  Tonsils not swollen or erythematous. Hearing normal.  Neck: Supple, thyroid normal.  Respiratory: Respiratory effort normal, BS equal bilaterally without rales, rhonchi, wheezing or stridor.  Cardio: RRR with no MRGs. Brisk peripheral pulses without edema.  Abdomen: Soft, + BS.  Non tender, no guarding, rebound, hernias, masses. Lymphatics: Non tender without lymphadenopathy.  Musculoskeletal: Full ROM, 5/5 strength, normal gait.  Skin: Warm, dry without rashes, lesions, ecchymosis.  Neuro: Cranial nerves intact. Normal muscle tone, no cerebellar symptoms. Sensation intact.  Psych: Awake and oriented X 3, normal affect, Insight and Judgment appropriate.     Alycia Rossetti, NP 4:33 PM [facility]

## 2022-11-03 ENCOUNTER — Ambulatory Visit (INDEPENDENT_AMBULATORY_CARE_PROVIDER_SITE_OTHER): Payer: Medicare Other | Admitting: Nurse Practitioner

## 2022-11-03 ENCOUNTER — Encounter: Payer: Self-pay | Admitting: Nurse Practitioner

## 2022-11-03 VITALS — BP 128/76 | HR 65 | Temp 97.7°F | Ht 61.0 in | Wt 137.2 lb

## 2022-11-03 DIAGNOSIS — K625 Hemorrhage of anus and rectum: Secondary | ICD-10-CM | POA: Diagnosis not present

## 2022-11-03 DIAGNOSIS — I10 Essential (primary) hypertension: Secondary | ICD-10-CM

## 2022-11-03 DIAGNOSIS — R195 Other fecal abnormalities: Secondary | ICD-10-CM

## 2022-11-03 DIAGNOSIS — Z8719 Personal history of other diseases of the digestive system: Secondary | ICD-10-CM

## 2022-11-03 NOTE — Patient Instructions (Addendum)
Stop Eliquis until evaluated by GI Appt tomorrow at 2:30 at Dr. Lynne Leader office If bleeding worsens, become extremely weak/fatigued or develop abdominal pain before that go to the ER.

## 2022-11-04 ENCOUNTER — Encounter: Payer: Self-pay | Admitting: Physician Assistant

## 2022-11-04 ENCOUNTER — Ambulatory Visit: Payer: Medicare Other | Admitting: Physician Assistant

## 2022-11-04 ENCOUNTER — Other Ambulatory Visit (INDEPENDENT_AMBULATORY_CARE_PROVIDER_SITE_OTHER): Payer: Medicare Other

## 2022-11-04 VITALS — BP 142/72 | HR 95 | Ht 61.0 in | Wt 137.0 lb

## 2022-11-04 DIAGNOSIS — K625 Hemorrhage of anus and rectum: Secondary | ICD-10-CM

## 2022-11-04 DIAGNOSIS — Z7901 Long term (current) use of anticoagulants: Secondary | ICD-10-CM

## 2022-11-04 DIAGNOSIS — Z8719 Personal history of other diseases of the digestive system: Secondary | ICD-10-CM

## 2022-11-04 DIAGNOSIS — R140 Abdominal distension (gaseous): Secondary | ICD-10-CM | POA: Diagnosis not present

## 2022-11-04 LAB — CBC WITH DIFFERENTIAL/PLATELET
Absolute Monocytes: 632 cells/uL (ref 200–950)
Basophils Absolute: 37 cells/uL (ref 0–200)
Basophils Absolute: 0.1 10*3/uL (ref 0.0–0.1)
Basophils Relative: 0.6 %
Basophils Relative: 0.9 % (ref 0.0–3.0)
Eosinophils Absolute: 242 cells/uL (ref 15–500)
Eosinophils Absolute: 0.2 10*3/uL (ref 0.0–0.7)
Eosinophils Relative: 3.9 %
Eosinophils Relative: 2.2 % (ref 0.0–5.0)
HCT: 39.2 % (ref 35.0–45.0)
HCT: 42.3 % (ref 36.0–46.0)
Hemoglobin: 13 g/dL (ref 11.7–15.5)
Hemoglobin: 13.9 g/dL (ref 12.0–15.0)
Lymphocytes Relative: 16.2 % (ref 12.0–46.0)
Lymphs Abs: 893 cells/uL (ref 850–3900)
Lymphs Abs: 1.3 10*3/uL (ref 0.7–4.0)
MCH: 30.9 pg (ref 27.0–33.0)
MCHC: 33.2 g/dL (ref 32.0–36.0)
MCHC: 32.9 g/dL (ref 30.0–36.0)
MCV: 93.1 fL (ref 80.0–100.0)
MCV: 93.8 fl (ref 78.0–100.0)
MPV: 11.2 fL (ref 7.5–12.5)
Monocytes Absolute: 0.8 10*3/uL (ref 0.1–1.0)
Monocytes Relative: 10.2 %
Monocytes Relative: 10.3 % (ref 3.0–12.0)
Neutro Abs: 4396 cells/uL (ref 1500–7800)
Neutro Abs: 5.8 10*3/uL (ref 1.4–7.7)
Neutrophils Relative %: 70.9 %
Neutrophils Relative %: 70.4 % (ref 43.0–77.0)
Platelets: 229 10*3/uL (ref 140–400)
Platelets: 250 10*3/uL (ref 150.0–400.0)
RBC: 4.21 10*6/uL (ref 3.80–5.10)
RBC: 4.51 Mil/uL (ref 3.87–5.11)
RDW: 12.7 % (ref 11.0–15.0)
RDW: 14.6 % (ref 11.5–15.5)
Total Lymphocyte: 14.4 %
WBC: 6.2 10*3/uL (ref 3.8–10.8)
WBC: 8.2 10*3/uL (ref 4.0–10.5)

## 2022-11-04 LAB — COMPLETE METABOLIC PANEL WITH GFR
AG Ratio: 2.3 (calc) (ref 1.0–2.5)
ALT: 17 U/L (ref 6–29)
AST: 20 U/L (ref 10–35)
Albumin: 4.5 g/dL (ref 3.6–5.1)
Alkaline phosphatase (APISO): 123 U/L (ref 37–153)
BUN/Creatinine Ratio: 16 (calc) (ref 6–22)
BUN: 24 mg/dL (ref 7–25)
CO2: 28 mmol/L (ref 20–32)
Calcium: 10.1 mg/dL (ref 8.6–10.4)
Chloride: 102 mmol/L (ref 98–110)
Creat: 1.49 mg/dL — ABNORMAL HIGH (ref 0.60–0.95)
Globulin: 2 g/dL (calc) (ref 1.9–3.7)
Glucose, Bld: 109 mg/dL — ABNORMAL HIGH (ref 65–99)
Potassium: 4 mmol/L (ref 3.5–5.3)
Sodium: 138 mmol/L (ref 135–146)
Total Bilirubin: 0.5 mg/dL (ref 0.2–1.2)
Total Protein: 6.5 g/dL (ref 6.1–8.1)
eGFR: 34 mL/min/{1.73_m2} — ABNORMAL LOW (ref >=60)

## 2022-11-04 LAB — FERRITIN: Ferritin: 94 ng/mL (ref 16–288)

## 2022-11-04 LAB — IRON, TOTAL/TOTAL IRON BINDING CAP
%SAT: 22 % (calc) (ref 16–45)
Iron: 67 ug/dL (ref 45–160)
TIBC: 301 mcg/dL (calc) (ref 250–450)

## 2022-11-04 NOTE — Telephone Encounter (Signed)
LM-11/04/22-Called pt. And completed a General Review Call. Pt. saw Lorenda Peck, NP yesterday for rectal bleeding noted w/ BMs that she has had for 5 days now. Pt. was referred to a GI specialist and will be seeing Amy Esterwood at [facility] today. Pt. denies n/v, constipation, or diarrhea. Pt. has no other health concerns to address. Pt. Reported adherence to her medications w/ no side effects noted. Pt. Requires assistance w/ walking sometimes she holds someone's hand or uses her cane. Pt. Has had no falls or increased pain since last visit. (20 min.)

## 2022-11-04 NOTE — Patient Instructions (Signed)
_______________________________________________________  If you are age 86 or older, your body mass index should be between 23-30. Your Body mass index is 25.89 kg/m. If this is out of the aforementioned range listed, please consider follow up with your Primary Care Provider.  If you are age 68 or younger, your body mass index should be between 19-25. Your Body mass index is 25.89 kg/m. If this is out of the aformentioned range listed, please consider follow up with your Primary Care Provider.   ________________________________________________________  The [place] [facility] providers would like to encourage you to use [facility] to communicate with providers for non-urgent requests or questions.  Due to long hold times on the telephone, sending your provider a message by [facility] may be a faster and more efficient way to get a response.  Please allow 48 business hours for a response.  Please remember that this is for non-urgent requests.  _______________________________________________________  Stay off Eliquis until Monday - then restart.  Continue Miralax daily  Please call Friday morning and speak to Amy's nurse if you are continuing to see any blood - go to the ED if you pass larger amounts of blood.

## 2022-11-04 NOTE — Progress Notes (Signed)
Subjective:    Patient ID: Briana [name], female    DOB: [birth year], 88 y.o.   MRN: [id number]  HPI [name] is a pleasant 88 year old white female, established with Dr. Fuller Plan who comes in today as an urgent add-on after being seen by her PCP yesterday with complaints of rectal bleeding. Patient has prior history of diverticular hemorrhage and required admission for this in April 2022.  She underwent flexible sigmoidoscopy at that time with finding of multiple small and large mouth diverticuli in the descending and sigmoid colon, there was evidence of past bleeding with clot in a tic and this was treated with 2 hemoclips, also had medium sized internal hemorrhoids.  She says her current bleeding started about 4 days ago.  She says she had a bowel movement and then noticed red and brown stool on the tissue and some in the commode.  She had 2 bowel movements that day and then 2 bowel movements daily over the next couple of days each showing brownish-red stool, no grossly bloody stools or clot noted.  Today she had 1 small bowel movement and says this looked mostly brown.  She does stay on MiraLAX daily and has continued that, has no complaints of fatigue dizziness shortness of breath etc.  Her appetite has been okay no complaints of nausea she has had some mild abdominal cramping which has resolved. When seen by PCP yesterday was noted to have reddish-brown stool on rectal exam.  She generally takes Eliquis for atrial fibrillation and this was held. Labs done yesterday show WBC of 6.2 hemoglobin 13 hematocrit of 39.2 MCV 93 platelets 229 She had had hemoglobin of 13.7 on 09/24/2022 BUN was 24/creatinine 1.48 and iron studies showed serum iron 67 TIBC 301 iron sat 22 and ferritin 94. Also had an emergency room visit on 09/15/2022 for what she explains was an episode of severe upper abdominal pain which she felt across her diaphragm bilaterally.  She had CT of the abdomen and pelvis done that showed  diverticulosis but no diverticulitis, small to medium hiatal hernia, there was a large right renal cyst 8.5 cm Bosniak 2.  Those symptoms resolved and she has not had any recurrence though she does have issues with gas frequently.  Other medical problems include atrial fibrillation-on chronic Eliquis, no prior CVA or TIA, chronic GERD, chronic kidney disease stage III, hyperparathyroidism, history of congestive heart failure with EF 55% and hypertension.  She has also had history of acquired thrombophilia.  Review of Systems Pertinent positive and negative review of systems were noted in the above HPI section.  All other review of systems was otherwise negative.   Outpatient Encounter Medications as of 11/04/2022  Medication Sig   acetaminophen (TYLENOL) 500 MG tablet Take 500 mg by mouth every 6 (six) hours as needed for moderate pain.   albuterol (PROAIR HFA) 108 (90 Base) MCG/ACT inhaler Take  2 inhalations  15 minutes apart  every 4 hours  as needed for Wheezing   amiodarone (PACERONE) 200 MG tablet Take 1 tablet by mouth once daily   Carboxymeth-Glycerin-Polysorb (REFRESH DIGITAL OP) Place 1 drop into the right eye daily as needed (dry eyes).   cholecalciferol (VITAMIN D3) 25 MCG (1000 UNIT) tablet Take 1,000 Units by mouth daily. M,W,F   Cyanocobalamin (B-12 SL) Place 1 tablet under the tongue daily.   diazepam (VALIUM) 2 MG tablet Take 1 tablet 3 - 4 x /day for Tremor                                     /  TAKE                    BY                     MOUTH   diltiazem (CARDIZEM CD) 180 MG 24 hr capsule TAKE 1 CAPSULE BY MOUTH ONCE DAILY FOR HEART AND FOR BLOOD PRESSURE   Ferrous Sulfate (IRON SLOW RELEASE PO) Take by mouth.   furosemide (LASIX) 80 MG tablet Take 1 tablet by mouth twice daily   Melatonin 10 MG TABS Take 1 tablet by mouth daily.   polyethylene glycol (MIRALAX / GLYCOLAX) 17 g packet Take 17 g by mouth daily.   potassium chloride SA (KLOR-CON)  20 MEQ tablet Take 1 tablet (20 mEq total) by mouth 2 (two) times daily.   terbinafine (LAMISIL) 250 MG tablet Take 1 tablet Daily for Nail Fungus   vitamin B-12 (CYANOCOBALAMIN) 50 MCG tablet Take 50 mcg by mouth daily.   vitamin C (ASCORBIC ACID) 500 MG tablet Take 500 mg by mouth daily.   zinc gluconate 50 MG tablet Take 50 mg by mouth daily.   apixaban (ELIQUIS) 2.5 MG TABS tablet Take 1 tablet by mouth twice daily (Patient not taking: Reported on 11/04/2022)   No facility-administered encounter medications on file as of 11/04/2022.   Allergies  Allergen Reactions   Accupril [Quinapril Hcl] Cough   Neosporin [Neomycin-Bacitracin Zn-Polymyx]     unknown   Prednisone     Agitated and shaky   Reglan [Metoclopramide] Other (See Comments)    tremors   Tramadol Nausea And Vomiting   Adhesive [Tape] Rash   Patient Active Problem List   Diagnosis Date Noted   Diverticular hemorrhage    Hyperparathyroidism due to renal insufficiency ([place]) ([place] [facility] 140 on 03/05/2021  03/06/2021   Aortic atherosclerosis ([place]) by Chest CTA on 11/21/2018.  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 07/01/2020   Acquired thrombophilia ([place]) 11/15/2019   Chronic diastolic heart failure ([place]) 04/03/2019   CKD stage 3 due to type 2 diabetes mellitus ([place]) 02/23/2018   Bilateral sensorineural hearing loss 07/02/2017   Obesity (BMI 30.0-34.9) 09/05/2015   Lumbar stenosis 06/05/2015   GERD 11/20/2013   Osteopenia 11/20/2013   Essential hypertension 11/19/2013   Type 2 diabetes mellitus with hyperlipidemia ([place]) 11/19/2013   Vitamin D deficiency 11/19/2013   Diffuse cystic mastopathy 11/19/2013   Social History   Socioeconomic History   Marital status: Widowed    Spouse name: Not on file   Number of children: Not on file   Years of education: Not on file   Highest education level: Not on file  Occupational History   Not on file  Tobacco Use   Smoking status: Never    Smokeless tobacco: Never  Substance and Sexual Activity   Alcohol use: No   Drug use: No   Sexual activity: Not on file  Other Topics Concern   Not on file  Social History Narrative   Not on file   Social Determinants of Health   Financial Resource Strain: Not on file  Food Insecurity: No Food Insecurity (10/31/2021)   Hunger Vital Sign    Worried About Running Out of Food in the Last Year: Never true    Ran Out of Food in the Last Year: Never true  Transportation Needs: No Transportation Needs (10/31/2021)   PRAPARE - Hydrologist (Medical): No  Lack of Transportation (Non-Medical): No  Physical Activity: Not on file  Stress: Not on file  Social Connections: Not on file  Intimate Partner Violence: Not on file    Ms. Marcinek family history includes Cancer in her brother; Heart disease in her sister; Hyperlipidemia in her mother; Hypertension in her mother.      Objective:    Vitals:   11/04/22 1419  BP: (!) 142/72  Pulse: 95    Physical Exam Well-developed well-nourished very pleasant elderly white female in no acute distress.  She is accompanied by a friend today height, Weight, 137 BMI 25.8  HEENT; nontraumatic normocephalic, EOMI, PE R LA, sclera anicteric. Oropharynx; not examined today Neck; supple, no JVD Cardiovascular; irregular rate and rhythm with S1-S2, no murmur rub or gallop Pulmonary; Clear bilaterally Abdomen; soft, very mild nonfocal tenderness in the left abdomen nondistended, no palpable mass or hepatosplenomegaly, bowel sounds are active Rectal mild external hemorrhoidal tags, stool is brown and heme positive no obvious blood noted Skin; benign exam, no jaundice rash or appreciable lesions Extremities; no clubbing cyanosis or edema skin warm and dry Neuro/Psych; alert and oriented x4, grossly nonfocal mood and affect appropriate        Assessment & Plan:   #75 88 year old white female with low-grade lower GI  bleeding over the past 4 days I suspect this is a very mild diverticular bleed. Patient had a 0.7 g drop in hemoglobin since mid November. Bleeding was in setting of Eliquis which has been on hold since yesterday Prior history of diverticular bleed with admission in April 2022 Hemoglobin yesterday after 3 days of bleeding was 13/hematocrit 39.2  Stool is brown and heme positive today on exam consistent with resolving bleed  #2 history of internal hemorrhoids #3 atrial fibrillation 4.  Hypertension 5.  Congestive heart failure 6.  Hyperparathyroidism 7.  Chronic kidney disease stage III 9.  GERD  Plan; repeat CBC today Have asked patient to hold Eliquis for 5 days then resume We will continue to monitor her stools.  I have asked her to call back in 48 hours if she is still seeing any blood, and at that time would repeat hemoglobin.  Should she develop any overtly bloody stools she has been advised to go to the emergency room for evaluation (likely less waiting at [facility] long than [facility]) Further recommendations pending her course over the next day or 2.  Thy Gullikson Genia Harold PA-C 11/04/2022   Cc: Unk Pinto, MD

## 2022-11-06 ENCOUNTER — Telehealth: Payer: Self-pay | Admitting: Gastroenterology

## 2022-11-06 NOTE — Telephone Encounter (Signed)
Patient is calling states she no longer has blood in her stool.

## 2022-11-15 DIAGNOSIS — N183 Chronic kidney disease, stage 3 unspecified: Secondary | ICD-10-CM | POA: Diagnosis not present

## 2022-11-15 DIAGNOSIS — E1122 Type 2 diabetes mellitus with diabetic chronic kidney disease: Secondary | ICD-10-CM | POA: Diagnosis not present

## 2022-11-15 DIAGNOSIS — I10 Essential (primary) hypertension: Secondary | ICD-10-CM | POA: Diagnosis not present

## 2022-11-25 ENCOUNTER — Other Ambulatory Visit: Payer: Self-pay | Admitting: Nurse Practitioner

## 2022-11-26 DIAGNOSIS — D0462 Carcinoma in situ of skin of left upper limb, including shoulder: Secondary | ICD-10-CM | POA: Diagnosis not present

## 2023-01-28 ENCOUNTER — Telehealth: Payer: Self-pay

## 2023-01-28 NOTE — Progress Notes (Signed)
Date: 01/28/23                 Patient Name: Briana [name]             DOB: Apr 13, 1934   Review office visits, consults, medication changes and chronic conditions for [facility] Pharmacist Outreach with CPP on 02/02/23.   Confirm visit date: 02/02/23 between 3-5PM Have you seen any providers since your last visit or any hospitalizations? 11/03/22 Carlye Grippe, NP Stop: Eliquis (until evaluation by GI), 11/04/22 [place], PA (GI) no med changes. Have there been any changes to your medications since your last visit? (Med. Adherence, care gaps) Unable to obtain medication history. Have you had any problems with getting your medications from your pharmacy? (Copay barriers) None What is your top health concern you'd like to discuss for your upcoming visit? Pt has rash on leg HC: Last Care Plan date: ** Needs updated care plan  Called daughter and [facility].  Total time spent: 49min   3/15 [facility] called daughter to confirm FPO. [place] Total time spent: 3min  3/15  Pts daughter, Charleen Kirks, called back and confirmed FPO.  Total time spent: 99min

## 2023-02-02 ENCOUNTER — Ambulatory Visit: Payer: Self-pay | Admitting: Pharmacist

## 2023-02-02 DIAGNOSIS — Z79899 Other long term (current) drug therapy: Secondary | ICD-10-CM

## 2023-02-02 DIAGNOSIS — M85831 Other specified disorders of bone density and structure, right forearm: Secondary | ICD-10-CM

## 2023-02-02 DIAGNOSIS — I5032 Chronic diastolic (congestive) heart failure: Secondary | ICD-10-CM

## 2023-02-02 DIAGNOSIS — N183 Chronic kidney disease, stage 3 unspecified: Secondary | ICD-10-CM

## 2023-02-02 DIAGNOSIS — I482 Chronic atrial fibrillation, unspecified: Secondary | ICD-10-CM

## 2023-02-02 DIAGNOSIS — I10 Essential (primary) hypertension: Secondary | ICD-10-CM

## 2023-02-02 NOTE — Progress Notes (Signed)
CP Care Plan  Zeitlin,Briana [name]  75 years, Female  DOB: 04-27-1934  M: [phone]  __________________________________________________ General Information Details Chronic Conditions: Atrial Fibrillation, Hypertension (HTN), Heart Failure (HF), Diabetes (DM), Osteopenia Contact your clinical pharmacist and care team with any questions or concerns. Team Phone #:: [phone] Clinical Pharmacist:: [place] Registered Nurse: [place]:: [place] High Blood Pressure GOAL: Maintain Blood Pressure less than: 130/80 Your most recent BP is: 128/76 taken on: 11/03/2022 Patient education:: Recommend following the DASH diet, which emphasizes fruits and vegetables and low-fat dairy products along with whole grains, fish, poultry, and nuts. Reduce red meats and sugars., Recommend limiting the daily amount of salt intake to less than 1500mg /per day or 2/3 teaspoonful a day, Discussed ways to increase movement., Discussed increasing exercise (walking, biking, swimming) to a goal of 30 minutes per day, as able based on current activity level and health and/or as directed by your healthcare provider., Educated patient to call PCP office if you develop episodes of low blood pressure, dizziness, falls or increased headaches, and/or swelling of legs and feet. Discussed checking and recording home blood pressure readings: Daily Your current blood pressure medications are:: Diltiazem ER 180mg  - 1 capsule daily for heart and blood pressure Diabetes (DM) GOAL: Maintain an A1c (long-term blood sugar control) less than: 7% Your most recent A1C is: 6.6 taken on: 09/24/2022 Patient education:: Instructed patient to check feet daily, and to call with any signs of infection such as redness, fever, swelling, pain, rise of skin temperature, open wounds, drainage, or unusual odor. Instructed patient to wear cushioned closed toe shoes that fit well with thick, padded  socks; avoid going barefoot.  Reviewed proper care of nails and calluses., Recommend avoiding both table sugar (ex: regular sodas, juice and sweet tea) and artificially (aspartame, saccharin, sucralose, etc.) sweetened beverages (ex: diet sodas) and increasing water intake., Discussed increasing exercise (walking, biking, swimming) to a goal of 30 minutes per day, as able based on current activity level and health and/or as directed by your healthcare provider., Education provided on importance of regular mealtimes, carbohydrates goals for each meal and snacks, food label reading, portion sizes, and increasing intake of dietary fiber (whole grains, fruits, vegetables, beans, nuts and seeds)., Discussed with patient to watch for signs of high blood sugar (fruity-smelling breath, nausea and vomiting, weakness, confusion, blurred vison, increased urination) and contact your provider if you have having blood sugars above 300 for more than 3 days., Recommend scheduling a yearly eye exam., Recommend seeing the dentist twice yearly. Heart Failure (HF) GOAL: Prevent: Worsening symptoms of fluid overload, Hospitalizations Patient education:: Recommend weighing yourself daily, using a scale on a hard flat surface, before breakfast after using the restroom and report any signs of fluid overload to your pharmacist, nurse, or primary care provider; Call if you have a weight gain of more than three pounds in a day or five pounds in a week, increased cough, swelling (feet, ankles, legs, stomach), shortness of breath, needing more pillows to sleep or needing to sit in a chair to sleep, Educated Patient to avoid adding salt to food and maintain low sodium diet with goal of less than 1500 mg per day (2/3 of a teaspoonful). Your current heart failure medications are:: Furosemide 80mg  - 1 tablet 2 times daily Potassium 20 MEq- 1 tablet daily Atrial Fibrillation GOAL:: Maintain controlled heart rhythm and rate and prevent stroke  / clots Patient education:: Educated Patient on signs and symptoms of  bleeding and to contact pharmacist / nurse / PCP with any questions or concerns. This includes a nosebleed that won't stop within 10 minutes, cuts / scrapes that won't stop bleeding, red or cola colored urine, red or dark-colored stool. Your current atrial fibrillation medications are:: Eliquis 2.5mg  - 1 tablet 2 times daily Amiodarone 200mg  - 1 tablet daily Osteopenia GOAL:: Prevent fractures and disabilities related to osteopenia Patient education:: Educated Patient on home safety and fall prevention, Recommend vitamin D 800 IU once a day to help improve low vitamin D levels and healthy bones., Recommend daily calcium intake of 1,200 mg per day.  Calcium is found in foods like yogurt, milk, orange juice with calcium, dark green leafy vegetables, and cheese.  If additional calcium supplement is needed, calcium citrate is the preferred form of calcium., Recommend doing weight bearing exercises, such as walking, light weights, and/or resistance training as tolerated., To avoid falls, recommend removing any loose throw rugs from walkways and moving any objects obstructing pathways in the home. Ensure there is adequate lighting, especially if moving around the house at night. Contact your primary care provider or pharmacist if any of the medications you take make you feel dizzy or too drowsy. Your current osteopenia medications are:: Vitamin D - 1000u three days per week (M, W, F) Other Needs Recommended Preventative Health Care:: Recommend scheduling Annual Wellness Visit, Recommend yearly flu vaccine, Recommend pneumonia vaccine  CPP Care Plan Update: 10 min

## 2023-02-02 NOTE — Progress Notes (Signed)
Focused Pharmacist Outreach     Completed visit today with pts dtr- pt walks with her friends on Tuesday and Thursday afternoons.     1. Medication changes: No Rx medication changes but has started some OTC allergy medication, but Eliquis is getting pricey; discussed getting additional samples from cardiology and PAP if needed in the future.   2. BP/weights- 140s-150s/70-80s on avg. and weights ranging 135, 137. Eating out with her friends often so sometimes will have 2# weight gain, but denies > 3lbs/24 hrs and > 5lbs in 1 week.   3. T2DM- not checking BG but denies any hypoglycemia or hyperglycemia symptoms.   4. Rash on legs- itchy, dry; recommended moisturizer without scent like Cereve +hydrocortisone cream 0.5-1%.     CPP Telemedicine Time 12 min

## 2023-02-10 DIAGNOSIS — H40021 Open angle with borderline findings, high risk, right eye: Secondary | ICD-10-CM | POA: Diagnosis not present

## 2023-02-10 DIAGNOSIS — Z961 Presence of intraocular lens: Secondary | ICD-10-CM | POA: Diagnosis not present

## 2023-02-10 DIAGNOSIS — H04123 Dry eye syndrome of bilateral lacrimal glands: Secondary | ICD-10-CM | POA: Diagnosis not present

## 2023-02-10 DIAGNOSIS — H0102B Squamous blepharitis left eye, upper and lower eyelids: Secondary | ICD-10-CM | POA: Diagnosis not present

## 2023-02-10 DIAGNOSIS — H401122 Primary open-angle glaucoma, left eye, moderate stage: Secondary | ICD-10-CM | POA: Diagnosis not present

## 2023-02-10 DIAGNOSIS — H353131 Nonexudative age-related macular degeneration, bilateral, early dry stage: Secondary | ICD-10-CM | POA: Diagnosis not present

## 2023-02-10 DIAGNOSIS — H43813 Vitreous degeneration, bilateral: Secondary | ICD-10-CM | POA: Diagnosis not present

## 2023-02-10 DIAGNOSIS — H0102A Squamous blepharitis right eye, upper and lower eyelids: Secondary | ICD-10-CM | POA: Diagnosis not present

## 2023-02-10 DIAGNOSIS — E119 Type 2 diabetes mellitus without complications: Secondary | ICD-10-CM | POA: Diagnosis not present

## 2023-02-10 DIAGNOSIS — H903 Sensorineural hearing loss, bilateral: Secondary | ICD-10-CM | POA: Diagnosis not present

## 2023-02-10 LAB — HM DIABETES EYE EXAM

## 2023-02-11 ENCOUNTER — Encounter: Payer: Self-pay | Admitting: Internal Medicine

## 2023-02-14 DIAGNOSIS — E1122 Type 2 diabetes mellitus with diabetic chronic kidney disease: Secondary | ICD-10-CM | POA: Diagnosis not present

## 2023-02-14 DIAGNOSIS — I5032 Chronic diastolic (congestive) heart failure: Secondary | ICD-10-CM | POA: Diagnosis not present

## 2023-02-14 DIAGNOSIS — N183 Chronic kidney disease, stage 3 unspecified: Secondary | ICD-10-CM | POA: Diagnosis not present

## 2023-02-14 DIAGNOSIS — I10 Essential (primary) hypertension: Secondary | ICD-10-CM | POA: Diagnosis not present

## 2023-02-15 ENCOUNTER — Other Ambulatory Visit: Payer: Self-pay | Admitting: Nurse Practitioner

## 2023-02-18 ENCOUNTER — Other Ambulatory Visit: Payer: Self-pay | Admitting: Internal Medicine

## 2023-02-18 DIAGNOSIS — Z1231 Encounter for screening mammogram for malignant neoplasm of breast: Secondary | ICD-10-CM

## 2023-03-01 DIAGNOSIS — Z85828 Personal history of other malignant neoplasm of skin: Secondary | ICD-10-CM | POA: Diagnosis not present

## 2023-03-01 DIAGNOSIS — L821 Other seborrheic keratosis: Secondary | ICD-10-CM | POA: Diagnosis not present

## 2023-03-01 DIAGNOSIS — L300 Nummular dermatitis: Secondary | ICD-10-CM | POA: Diagnosis not present

## 2023-03-11 ENCOUNTER — Encounter: Payer: Self-pay | Admitting: Internal Medicine

## 2023-03-11 ENCOUNTER — Ambulatory Visit (INDEPENDENT_AMBULATORY_CARE_PROVIDER_SITE_OTHER): Payer: Medicare Other | Admitting: Internal Medicine

## 2023-03-11 VITALS — BP 136/78 | HR 86 | Temp 98.0°F | Resp 16 | Ht 60.0 in | Wt 139.8 lb

## 2023-03-11 DIAGNOSIS — Z0001 Encounter for general adult medical examination with abnormal findings: Secondary | ICD-10-CM

## 2023-03-11 DIAGNOSIS — Z136 Encounter for screening for cardiovascular disorders: Secondary | ICD-10-CM

## 2023-03-11 DIAGNOSIS — E1122 Type 2 diabetes mellitus with diabetic chronic kidney disease: Secondary | ICD-10-CM

## 2023-03-11 DIAGNOSIS — N2581 Secondary hyperparathyroidism of renal origin: Secondary | ICD-10-CM | POA: Diagnosis not present

## 2023-03-11 DIAGNOSIS — I700 Atherosclerosis of aorta: Secondary | ICD-10-CM

## 2023-03-11 DIAGNOSIS — I10 Essential (primary) hypertension: Secondary | ICD-10-CM | POA: Diagnosis not present

## 2023-03-11 DIAGNOSIS — N183 Chronic kidney disease, stage 3 unspecified: Secondary | ICD-10-CM | POA: Diagnosis not present

## 2023-03-11 DIAGNOSIS — I482 Chronic atrial fibrillation, unspecified: Secondary | ICD-10-CM

## 2023-03-11 DIAGNOSIS — Z1211 Encounter for screening for malignant neoplasm of colon: Secondary | ICD-10-CM

## 2023-03-11 DIAGNOSIS — E559 Vitamin D deficiency, unspecified: Secondary | ICD-10-CM

## 2023-03-11 DIAGNOSIS — I5032 Chronic diastolic (congestive) heart failure: Secondary | ICD-10-CM

## 2023-03-11 DIAGNOSIS — G8929 Other chronic pain: Secondary | ICD-10-CM

## 2023-03-11 DIAGNOSIS — E1169 Type 2 diabetes mellitus with other specified complication: Secondary | ICD-10-CM

## 2023-03-11 DIAGNOSIS — Z79899 Other long term (current) drug therapy: Secondary | ICD-10-CM

## 2023-03-11 DIAGNOSIS — D6869 Other thrombophilia: Secondary | ICD-10-CM

## 2023-03-11 DIAGNOSIS — Z0000 Encounter for general adult medical examination without abnormal findings: Secondary | ICD-10-CM | POA: Diagnosis not present

## 2023-03-11 DIAGNOSIS — H6123 Impacted cerumen, bilateral: Secondary | ICD-10-CM | POA: Diagnosis not present

## 2023-03-11 DIAGNOSIS — M25552 Pain in left hip: Secondary | ICD-10-CM

## 2023-03-11 LAB — CBC WITH DIFFERENTIAL/PLATELET
Basophils Relative: 0.5 %
Eosinophils Absolute: 40 cells/uL (ref 15–500)
Hemoglobin: 13.5 g/dL (ref 11.7–15.5)
Lymphs Abs: 1003 cells/uL (ref 850–3900)
MCHC: 33.5 g/dL (ref 32.0–36.0)
MPV: 11.4 fL (ref 7.5–12.5)
Neutro Abs: 4792 cells/uL (ref 1500–7800)
Neutrophils Relative %: 72.6 %

## 2023-03-11 NOTE — Patient Instructions (Signed)

## 2023-03-11 NOTE — Progress Notes (Signed)
Annual Screening/Preventative Visit & Comprehensive Evaluation &  Examination   Future Appointments  Date Time Provider Department  03/11/2023                      cpe  3:00 PM [name], MD [facility]  03/19/2023 11:00 AM Lennette Bihari, MD [facility]  06/14/2023                      wellness  4:00 PM Raynelle Dick, NP [facility]  03/14/2024                      cpe   3:00 PM [name], MD [facility]        This very nice 89 y.o.  WWF presents for a Screening /Preventative Visit & comprehensive evaluation and management of multiple medical co-morbidities.  Patient has been followed for HTN, ASHD/cAfib, HLD, T2_NIDDM  and Vitamin D Deficiency.  CTA Chest in Jan 2020 showed Aortic Atherosclerosis. Patient was hospitalized in Apr 2022 due GI bleed due to severe Sigmoid /Desc. colon Diverticular Dz  (on Eliquis).       Patient c/o Left hip & Knee pains. She requests referral back to Dr Lequita Halt whom she relates did her initial surgery .       HTN predates since  1996.  In Jan 2020, patient was dx'd with cAfib & Diastolic Ht failure & failed CV x 2.  In Mar 2022, she was hospitalized with CHF & AKI on CKD.  Patient has  CKD3b (GFR 42) by recent labs at Nephrologist Dr Essie Hart office.  Patient's BP has been controlled at home and patient denies any cardiac symptoms as chest pain, palpitations, shortness of breath, dizziness or ankle swelling. Today's BP is at goal -  136/78 .        Patient's hyperlipidemia is not controlled with diet and medications. Patient denies myalgias or other medication SE's. Last lipids were near goal :  Lab Results  Component Value Date   CHOL 195 09/24/2022   HDL 66 09/24/2022   LDLCALC 110 (H) 09/24/2022   TRIG 96 09/24/2022   CHOLHDL 3.0 09/24/2022          Patient has hx/o T2_NIDDM  (A1c 6.6% /2011) w/CKD3b (GFR  34) and she has been attempting control with diet.  Patient denies reactive hypoglycemic symptoms, visual blurring,  diabetic polys or paresthesias. Last A1c was not at goal :  Lab Results  Component Value Date   HGBA1C 6.6 (H) 09/24/2022         Finally, patient has history of Vitamin D Deficiency (23" / 2008) and last Vitamin D was at goal :  Lab Results  Component Value Date   VD25OH 37 09/24/2022       Current Outpatient Medications on File Prior to Visit  Medication Sig   acetaminophen 500 MG tablet Take every 6 (six) hours as needed for moderate pain.   amiodarone (PACERONE) 200 MG tablet Take 1 tablet once daily   apixaban (ELIQUIS) 2.5 MG TABS tablet Take 1 tablet wice daily   VITAMIN D 1,000 Units   Take daily.   Cyanocobalamin (B-12 SL) Place 1 tablet under the tongue daily.   diazepam 2 MG tablet Take  1 tablet  4 x /day  for Tremor     diltiazem  CD) 180 MG  Take  1 capsule  Daily     furosemide  80 MG  tablet Take 1 tablet twice daily   Melatonin 10 MG TABS Take 1 tablet daily.   polyethylene glycol 17 g packet Take 1daily.   potassium chloride 20 MEQ tablet Take 10 mEq daily   PROAIR HFA inhaler Inhale 2 puffs  every 6 hours as needed   Pyridoxine HCl (VITAMIN B-6) Take 1 capsule  daily.   vitamin C  500 MG tablet Take  daily.   zinc 50 MG tablet Take daily.     Allergies  Allergen Reactions   Accupril [Quinapril Hcl] Cough   Neosporin     Prednisone Agitated and shaky   Reglan [Metoclopramide] tremors   Tramadol Nausea And Vomiting   Adhesive [Tape] Rash     Past Medical History:  Diagnosis Date   Acute on chronic diastolic (congestive) heart failure (HCC) 11/21/2018   Cancer (HCC)    skin cancer   DDD (degenerative disc disease)    Diverticula of colon    Diverticulitis    DJD (degenerative joint disease)    GERD (gastroesophageal reflux disease)    takes ranitidine   Heart murmur    History of hiatal hernia    History of pneumonia    Hx of irritable bowel syndrome    Hyperlipidemia    Hypertension    Occasional tremors    Osteoporosis    Pre-diabetes     Varicose veins    Vitamin D deficiency      Health Maintenance  Topic Date Due   COVID-19 Vaccine (1) Never done   Zoster Vaccines- Shingrix (1 of 2) Never done   HEMOGLOBIN A1C  02/10/2022   FOOT EXAM  03/04/2022   OPHTHALMOLOGY EXAM  03/05/2022   INFLUENZA VACCINE  06/16/2022   URINE MICROALBUMIN  08/13/2022   TETANUS/TDAP  09/04/2025   Pneumonia Vaccine 45+ Years old  Completed   DEXA SCAN  Completed   HPV VACCINES  Aged Out     Immunization History  Administered Date(s) Administered   DT  09/05/2015   DTaP 11/16/2004   Influenza Whole 08/30/2013   Influenza, High Dose  08/10/2016, 11/20/2019, 09/10/2020   Pneumococcal -13 10/19/2016   Pneumococcal -23 11/16/2001   Zoster, Live 06/01/2013    Last Colon - 01/21/2010 - Dr Russella Dar - recc 10 yr f/u Mar 2022 - Aged out Flex Sig  03/08/2021   -  Severe Diverticular Dz.   Last MGM - 03/04/2021 & scheduled 03/24/2022   Past Surgical History:  Procedure Laterality Date   APPENDECTOMY     BREAST EXCISIONAL BIOPSY Right    BREAST SURGERY Right    fluid drained from breast   CARDIOVERSION N/A 12/20/2018   Procedure: CARDIOVERSION;  Surgeon: [name], MD;  Location: [facility] ENDOSCOPY;  Service: Cardiovascular;  Laterality: N/A;   CARDIOVERSION N/A 06/16/2019   Procedure: CARDIOVERSION;  Surgeon: [name], MD;  Location: [facility] ENDOSCOPY;  Service: Cardiovascular;  Laterality: N/A;   COLONOSCOPY     ESOPHAGOGASTRODUODENOSCOPY     FLEXIBLE SIGMOIDOSCOPY N/A 03/09/2021   Procedure: FLEXIBLE SIGMOIDOSCOPY;  Surgeon: [name], MD;  Location: [facility] ENDOSCOPY;  Service: Endoscopy;  Laterality: N/A;   HEMOSTASIS CLIP PLACEMENT  03/09/2021   Procedure: HEMOSTASIS CLIP PLACEMENT;  Surgeon: [name], MD;  Location: [facility] ENDOSCOPY;  Service: Endoscopy;;   JOINT REPLACEMENT Left    left hip   LUMBAR LAMINECTOMY/DECOMPRESSION MICRODISCECTOMY N/A 06/05/2015   Procedure: L3-L5 DECOMPRESSION ;  Surgeon: [name], MD;  Location: [facility] OR;  Service: Orthopedics;  Laterality:  N/A;   OPEN REDUCTION INTERNAL FIXATION (ORIF) DISTAL RADIAL FRACTURE Right 01/10/2014   Procedure: OPEN REDUCTION INTERNAL FIXATION (ORIF) DISTAL RADIAL FRACTURE;  Surgeon: [name], MD;  Location: [facility] OR;  Service: Orthopedics;  Laterality: Right;   SPINAL CORD DECOMPRESSION  06/05/2015   L3 L 5   THYROID SURGERY     TONSILLECTOMY     TUBAL LIGATION     varicose veins stripped     WRIST FRACTURE SURGERY Bilateral      Family History  Problem Relation Age of Onset   Hypertension Mother    Hyperlipidemia Mother    Heart disease Sister    Cancer Brother        prostate   Colon cancer Neg Hx    Esophageal cancer Neg Hx    Pancreatic cancer Neg Hx    Stomach cancer Neg Hx    Liver disease Neg Hx      Social History   Tobacco Use   Smoking status: Never   Smokeless tobacco: Never  Substance Use Topics   Alcohol use: No   Drug use: No      ROS Constitutional: Denies fever, chills, weight loss/gain, headaches, insomnia,  night sweats, and change in appetite. Does c/o fatigue. Eyes: Denies redness, blurred vision, diplopia, discharge, itchy, watery eyes.  ENT: Denies discharge, congestion, post nasal drip, epistaxis, sore throat, earache, hearing loss, dental pain, Tinnitus, Vertigo, Sinus pain, snoring.  Cardio: Denies chest pain, palpitations, irregular heartbeat, syncope, dyspnea, diaphoresis, orthopnea, PND, claudication, edema Respiratory: denies cough, dyspnea, DOE, pleurisy, hoarseness, laryngitis, wheezing.  Gastrointestinal: Denies dysphagia, heartburn, reflux, water brash, pain, cramps, nausea, vomiting, bloating, diarrhea, constipation, hematemesis, melena, hematochezia, jaundice, hemorrhoids Genitourinary: Denies dysuria, frequency, urgency, nocturia, hesitancy, discharge, hematuria, flank pain Breast: Breast lumps, nipple discharge, bleeding.  Musculoskeletal: Denies arthralgia, myalgia,  stiffness, Jt. Swelling, pain, limp, and strain/sprain. Denies falls. Skin: Denies puritis, rash, hives, warts, acne, eczema, changing in skin lesion Neuro: No weakness, tremor, incoordination, spasms, paresthesia, pain Psychiatric: Denies confusion, memory loss, sensory loss. Denies Depression. Endocrine: Denies change in weight, skin, hair change, nocturia, and paresthesia, diabetic polys, visual blurring, hyper / hypo glycemic episodes.  Heme/Lymph: No excessive bleeding, bruising, enlarged lymph nodes.  Physical Exam  There were no vitals taken for this visit.  General Appearance:  older, well groomed and in no apparent distress.  Eyes: PERRLA, EOMs, conjunctiva no swelling or erythema, normal fundi and vessels. Sinuses: No frontal/maxillary tenderness ENT/Mouth: EACs patent / TMs  nl. Nares clear without erythema, swelling, mucoid exudates. Oral hygiene is good. No erythema, swelling, or exudate. Tongue normal, non-obstructing. Tonsils not swollen or erythematous. Hearing normal.  Neck: Supple, thyroid not palpable. No bruits, nodes or JVD. Respiratory: Respiratory effort normal.  BS equal and clear bilateral without rales, rhonci, wheezing or stridor. Cardio: Heart sounds are normal with regular rate and rhythm and no murmurs, rubs or gallops. Peripheral pulses are normal and equal bilaterally without edema. No aortic or femoral bruits. Chest: symmetric with normal excursions and percussion. Breasts: Deferred to upcoming MGM. Abdomen: Flat, soft with bowel sounds active. Nontender, no guarding, rebound, hernias, masses, or organomegaly.  Lymphatics: Non tender without lymphadenopathy.  Musculoskeletal: Full ROM all peripheral extremities, joint stability, 5/5 strength, and normal gait. Skin: Warm and dry without rashes, lesions, cyanosis or clubbing.  Large tender  Hematoma over Rt lateral buttock area posterior & at le\vel of Gr Trochanter. Normal Hip ROM bilateral.  Neuro: Cranial  nerves intact, reflexes equal bilaterally. Normal  muscle tone, no cerebellar symptoms. Sensation intact.  Pysch: Alert and oriented X 3, normal affect, Insight and Judgment appropriate.    Assessment and Plan  1. Annual Preventative Screening Examination   2. Essential hypertension  - EKG 12-Lead - Urinalysis, Routine w reflex microscopic - Microalbumin / creatinine urine ratio - CBC with Differential/Platelet - COMPLETE METABOLIC PANEL WITH GFR - Magnesium - TSH  3. Hyperlipidemia associated with type 2 diabetes mellitus (HCC)  - EKG 12-Lead - Lipid panel - TSH  4. Type 2 diabetes mellitus with stage 3a chronic kidney  disease, without long-term current use of insulin (HCC)  - EKG 12-Lead - Urinalysis, Routine w reflex microscopic - Microalbumin / creatinine urine ratio - HM DIABETES FOOT EXAM - PR LOW EXTEMITY NEUR EXAM DOCUM - PTH, intact and calcium - Hemoglobin A1c - Insulin, random  5. Vitamin D deficiency  - VITAMIN D 25 Hydroxy   6. Hyperparathyroidism due to renal insufficiency (HCC)  (Elev PTH 140 on 03/05/2021   - PTH, intact and calcium - COMPLETE METABOLIC PANEL WITH GFR  7. Aortic atherosclerosis (HCC) by Chest CTA on 11/21/2018.   - EKG 12-Lead - Lipid panel  8. Chronic atrial fibrillation (HCC)  - EKG 12-Lead  9. Chronic diastolic CHF (congestive heart failure) (HCC)  - EKG 12-Lead - Lipid panel  10. Acquired thrombophilia (HCC)  - CBC with Differential/Platelet  11. Screening for colorectal cancer  - POC Hemoccult Bld/Stl  12. Medication management  - Urinalysis, Routine w reflex microscopic - Microalbumin / creatinine urine ratio - CBC with Differential/Platelet - COMPLETE METABOLIC PANEL WITH GFR - Magnesium - Lipid panel - TSH - Hemoglobin A1c - Insulin, random - VITAMIN D 25 Hydroxy          Patient was counseled in prudent diet to achieve/maintain BMI less than 25 for weight control, BP monitoring, regular  exercise and medications. Discussed med's effects and SE's. Screening labs and tests as requested with regular follow-up as recommended. Over 40 minutes of exam, counseling, chart review and high complex critical decision making was performed.   Marinus Maw, MD

## 2023-03-12 ENCOUNTER — Other Ambulatory Visit: Payer: Self-pay | Admitting: Internal Medicine

## 2023-03-12 DIAGNOSIS — N183 Chronic kidney disease, stage 3 unspecified: Secondary | ICD-10-CM

## 2023-03-12 LAB — COMPLETE METABOLIC PANEL WITH GFR
AG Ratio: 2.1 (calc) (ref 1.0–2.5)
ALT: 19 U/L (ref 6–29)
AST: 17 U/L (ref 10–35)
Albumin: 4.2 g/dL (ref 3.6–5.1)
Alkaline phosphatase (APISO): 142 U/L (ref 37–153)
BUN/Creatinine Ratio: 18 (calc) (ref 6–22)
BUN: 30 mg/dL — ABNORMAL HIGH (ref 7–25)
CO2: 29 mmol/L (ref 20–32)
Calcium: 10.2 mg/dL (ref 8.6–10.4)
Chloride: 102 mmol/L (ref 98–110)
Creat: 1.68 mg/dL — ABNORMAL HIGH (ref 0.60–0.95)
Globulin: 2 g/dL (calc) (ref 1.9–3.7)
Glucose, Bld: 116 mg/dL — ABNORMAL HIGH (ref 65–99)
Potassium: 4.2 mmol/L (ref 3.5–5.3)
Sodium: 140 mmol/L (ref 135–146)
Total Bilirubin: 0.5 mg/dL (ref 0.2–1.2)
Total Protein: 6.2 g/dL (ref 6.1–8.1)
eGFR: 29 mL/min/{1.73_m2} — ABNORMAL LOW (ref >=60)

## 2023-03-12 LAB — CBC WITH DIFFERENTIAL/PLATELET
Absolute Monocytes: 733 cells/uL (ref 200–950)
Basophils Absolute: 33 cells/uL (ref 0–200)
Eosinophils Relative: 0.6 %
HCT: 40.3 % (ref 35.0–45.0)
MCH: 30.4 pg (ref 27.0–33.0)
MCV: 90.8 fL (ref 80.0–100.0)
Monocytes Relative: 11.1 %
Platelets: 222 10*3/uL (ref 140–400)
RBC: 4.44 10*6/uL (ref 3.80–5.10)
RDW: 13.1 % (ref 11.0–15.0)
Total Lymphocyte: 15.2 %
WBC: 6.6 10*3/uL (ref 3.8–10.8)

## 2023-03-12 LAB — TSH: TSH: 1.26 mIU/L (ref 0.40–4.50)

## 2023-03-12 LAB — URINALYSIS, ROUTINE W REFLEX MICROSCOPIC
Bacteria, UA: NONE SEEN /HPF
Bilirubin Urine: NEGATIVE
Glucose, UA: NEGATIVE
Hgb urine dipstick: NEGATIVE
Ketones, ur: NEGATIVE
Nitrite: NEGATIVE
Protein, ur: NEGATIVE
Specific Gravity, Urine: 1.013 (ref 1.001–1.035)
Squamous Epithelial / HPF: NONE SEEN /HPF (ref <=5)
pH: 5.5 (ref 5.0–8.0)

## 2023-03-12 LAB — LIPID PANEL
Cholesterol: 186 mg/dL (ref <200)
HDL: 61 mg/dL (ref >=50)
LDL Cholesterol (Calc): 106 mg/dL (calc) — ABNORMAL HIGH
Non-HDL Cholesterol (Calc): 125 mg/dL (calc) (ref <130)
Total CHOL/HDL Ratio: 3 (calc) (ref <5.0)
Triglycerides: 94 mg/dL (ref <150)

## 2023-03-12 LAB — MICROALBUMIN / CREATININE URINE RATIO
Creatinine, Urine: 79 mg/dL (ref 20–275)
Microalb Creat Ratio: 24 mg/g creat (ref <30)
Microalb, Ur: 1.9 mg/dL

## 2023-03-12 LAB — INSULIN, RANDOM: Insulin: 7.8 u[IU]/mL

## 2023-03-12 LAB — HEMOGLOBIN A1C
Hgb A1c MFr Bld: 7.9 % of total Hgb — ABNORMAL HIGH (ref <5.7)
Mean Plasma Glucose: 180 mg/dL
eAG (mmol/L): 10 mmol/L

## 2023-03-12 LAB — VITAMIN D 25 HYDROXY (VIT D DEFICIENCY, FRACTURES): Vit D, 25-Hydroxy: 49 ng/mL (ref 30–100)

## 2023-03-12 LAB — MICROSCOPIC MESSAGE

## 2023-03-12 LAB — PTH, INTACT AND CALCIUM
Calcium: 10.2 mg/dL (ref 8.6–10.4)
PTH: 277 pg/mL — ABNORMAL HIGH (ref 16–77)

## 2023-03-12 LAB — MAGNESIUM: Magnesium: 2.4 mg/dL (ref 1.5–2.5)

## 2023-03-12 MED ORDER — GLIPIZIDE 5 MG PO TABS
ORAL_TABLET | ORAL | 3 refills | Status: DC
Start: 2023-03-12 — End: 2023-04-15

## 2023-03-12 NOTE — Progress Notes (Signed)
All Else - CBC - Kidneys - Electrolytes - Liver - Magnesium & Thyroid    - all  Normal / OK ===========================================================  

## 2023-03-13 ENCOUNTER — Encounter: Payer: Self-pay | Admitting: Internal Medicine

## 2023-03-17 ENCOUNTER — Telehealth: Payer: Self-pay | Admitting: Internal Medicine

## 2023-03-17 NOTE — Telephone Encounter (Signed)
Pt is staying with other daughter Kriste Basque right now and she is wanting to get clarification on some of the things her sister talked to you about in regards to the recent lab work

## 2023-03-19 ENCOUNTER — Encounter: Payer: Self-pay | Admitting: Cardiovascular Disease

## 2023-03-19 ENCOUNTER — Other Ambulatory Visit: Payer: Self-pay | Admitting: General Practice

## 2023-03-19 ENCOUNTER — Ambulatory Visit: Payer: Medicare Other | Attending: Cardiovascular Disease | Admitting: Cardiovascular Disease

## 2023-03-19 VITALS — BP 140/79 | HR 61 | Ht 60.0 in | Wt 134.4 lb

## 2023-03-19 DIAGNOSIS — I10 Essential (primary) hypertension: Secondary | ICD-10-CM

## 2023-03-19 DIAGNOSIS — N184 Chronic kidney disease, stage 4 (severe): Secondary | ICD-10-CM

## 2023-03-19 DIAGNOSIS — I5032 Chronic diastolic (congestive) heart failure: Secondary | ICD-10-CM | POA: Diagnosis not present

## 2023-03-19 DIAGNOSIS — R7989 Other specified abnormal findings of blood chemistry: Secondary | ICD-10-CM | POA: Diagnosis not present

## 2023-03-19 DIAGNOSIS — I4892 Unspecified atrial flutter: Secondary | ICD-10-CM | POA: Diagnosis not present

## 2023-03-19 DIAGNOSIS — Z7901 Long term (current) use of anticoagulants: Secondary | ICD-10-CM

## 2023-03-19 DIAGNOSIS — I480 Paroxysmal atrial fibrillation: Secondary | ICD-10-CM

## 2023-03-19 MED ORDER — AMIODARONE HCL 200 MG PO TABS: 300.0000 mg | ORAL_TABLET | Freq: Every day | ORAL | 3 refills | Status: DC

## 2023-03-19 NOTE — Patient Instructions (Addendum)
Medication Instructions:  Decrease Lasix to 80 mg daily Increase Amiodarone to 200 mg twice a day for 1 week, then decrease to 300 mg (1.5 tablets) daily   *If you need a refill on your cardiac medications before your next appointment, please call your pharmacy*   Lab Work: None ordered   If you have labs (blood work) drawn today and your tests are completely normal, you will receive your results only by: MyChart Message (if you have MyChart) OR A paper copy in the mail If you have any lab test that is abnormal or we need to change your treatment, we will call you to review the results.   Testing/Procedures: Your physician has requested that you have an echocardiogram. Echocardiography is a painless test that uses sound waves to create images of your heart. It provides your doctor with information about the size and shape of your heart and how well your heart's chambers and valves are working. This procedure takes approximately one hour. There are no restrictions for this procedure. Please do NOT wear cologne, perfume, aftershave, or lotions (deodorant is allowed). Please arrive 15 minutes prior to your appointment time.    Follow-Up: Your physician has referred you to A-Fib clinic   Other Instructions

## 2023-03-19 NOTE — Progress Notes (Unsigned)
Cardiology Office Note    Date:  03/21/2023   ID:  RHYLI FERRERAS, DOB 1934-06-13, MRN [id number]  PCP:  [name], MD  Cardiologist:  Nicki Guadalajara, MD   F/U cardiology evaluation, initially referred through the courtesy of Quentin Mulling, PA-C for evaluation of chest pain and exertional shortness of breath.  History of Present Illness:  AL NOURY is a 89 y.o. female who I last saw on December 29, 2019.  She presents for a over 3-year follow-up evaluation with me.  Ms. Behrns has a history of hypertension, hyperlipidemia, varicose veins, status post surgery, and GERD.  Over the past several months she has noticed development of shortness of breath with exertion, fatigue, and has experienced some left upper chest pain radiating to her left shoulder which she describes as pins or sticking into her.  She is able to walk down.  Her long driveway without difficulty.  She denies any specific chest pain with walking and she walks 1 mile at times without difficulty.  She was recently evaluated by Quentin Mulling and because of this persistent increasing symptomatology in light of her age and risk factors she was referred for cardiology evaluation and consideration for stress testing.  She underwent a nuclear stress test on 08/18/2017.  This was entirely normal and showed an EF of 70%, no ST segment changes and had normal myocardial perfusion without scar or ischemia.  A 2-D echo Doppler study on 08/20/2017 showed an EF of 60-65%.  There was grade 2 diastolic dysfunction.  She had mild aortic insufficiency, mild MR, trivial TR and incidentally a trivial pericardial effusion was identified.  I saw her in October 2018 at which time she did not have  any recurrent chest pain suggestive of angina.  He did have some right-sided musculoskeletal costochondral tenderness.  Over the past 2 years, she has been evaluated numerous times with Azalee Course, PA, and more recently Edd Fabian, NP.  She had  developed atrial fibrillation in January 2020.  An echo Doppler study on January 6, /2020 showed an EF of 60 to 65%.  Diastolic function could not be assessed due to the underlying atrial fibrillation.  There was mild left ventricular high hypertrophy.  She did not have any wall motion abnormalities.  There was trivial AR, mild left atrial dilatation she underwent cardioversion on December 20, 2018 but states she only stayed in sinus rhythm for approximately 1 week.  She underwent repeat cardioversion on June 16, 2019 and has been maintaining sinus rhythm since that time.  She has been on amiodarone.  She has had issues with sinus bradycardia.  An initial attempt at stopping her atenolol resulted in exacerbation of her blood pressure control and as result she was later restarted back on atenolol therapy.  She saw Edd Fabian, NP on September 12, 2019 at which time she was hypertensive and her amlodipine dose was increased to 5 mg twice a day.  She last saw him on September 28, 2019.    I last saw her on December 29, 2019.  She  has been measuring her blood pressure and pulse rate at home.  Typically her pulse runs in the 50s but there is a rare occasion where it drops to 49.  Her blood pressures recently have been elevated ranging from 140 - 170.  She denied chest pain PND orthopnea.  She denied bleeding.  During that evaluation I recommended that if her resting pulse is below  50 she reduce atenolol to 12.5 mg.  Over the past several months, she has continued to have labile blood pressure with blood pressure readings at home typically ranging from 140 up to 170s systolically.  She denies any anginal type symptoms.  She denies any dizziness.  She had undergone laboratory in January 2021 by her primary physician which showed hemoglobin 13.8 hematocrit 41.9.  Glucose was 114.  Creatinine 0.99.  LFTs were normal.  TSH was 1.13.  Lipid studies showed a total cholesterol 214, HDL 60, triglycerides 102, and LDL 134.   During that evaluation she was bradycardic with pulse of 46 and I recommended she decrease atenolol down to 12.5 mg daily.  She was on amiodarone 200 mg.  With continued blood pressure elevation I recommended slight titration of losartan to 75 mg and amlodipine to 7.5 mg.  She underwent an echo Doppler study on January 24, 2021 which showed an EF of 55%.  She had moderate left atrial dilatation and mild right atrial dilatation.  There was mild to moderate MR.  She has been followed by Azalee Course, PA and Edd Fabian, NP on several occasions and last saw Edd Fabian, NP in November 2023 in follow-up of her chronic diastolic heart failure, kidney insufficiency, and PAF.  She was on Eliquis for anticoagulation.  The patient's 3 daughters live out of state with 1 in [place], another in [place], and another in the [place].  She was recently evaluated by Dr. Newman Nip on March 11, 2023.  Laboratory in November 2023 showed LDL at 110 with total cholesterol 195 triglycerides 96.  Hemoglobin A1c was 6.6.  Most recent laboratory on March 11, 2023 showed a creatinine of 1.68, glucose 116.  LDL cholesterol was 106.  TSH 1.26.  Hemoglobin A1c 7.9.  Vitamin D 49.  PTH was significantly increased at 277 with calcium 10.2.  Patient's daughter states her mother has been experiencing an irregular heart rhythm perhaps for several months.  She currently is on amiodarone 200 mg daily, diltiazem 180 mg, Lasix 80 mg twice a day and Eliquis 2.5 mg twice a day.  She takes as needed albuterol.  She presents for cardiology evaluation.  Past Medical History:  Diagnosis Date   Acute on chronic diastolic (congestive) heart failure (HCC) 11/21/2018   Cancer (HCC)    skin cancer   DDD (degenerative disc disease)    Diverticula of colon    Diverticulitis    DJD (degenerative joint disease)    GERD (gastroesophageal reflux disease)    takes ranitidine   Heart murmur    History of hiatal hernia    History of  pneumonia    Hx of irritable bowel syndrome    Hyperlipidemia    Hypertension    Occasional tremors    Osteoporosis    Pre-diabetes    Varicose veins    Vitamin D deficiency     Past Surgical History:  Procedure Laterality Date   APPENDECTOMY     BREAST EXCISIONAL BIOPSY Right    BREAST SURGERY Right    fluid drained from breast   CARDIOVERSION N/A 12/20/2018   Procedure: CARDIOVERSION;  Surgeon: [name], MD;  Location: [facility] ENDOSCOPY;  Service: Cardiovascular;  Laterality: N/A;   CARDIOVERSION N/A 06/16/2019   Procedure: CARDIOVERSION;  Surgeon: [name], MD;  Location: [facility] ENDOSCOPY;  Service: Cardiovascular;  Laterality: N/A;   COLONOSCOPY     ESOPHAGOGASTRODUODENOSCOPY     FLEXIBLE SIGMOIDOSCOPY N/A 03/09/2021   Procedure: FLEXIBLE  SIGMOIDOSCOPY;  Surgeon: [name], MD;  Location: [facility] ENDOSCOPY;  Service: Endoscopy;  Laterality: N/A;   HEMOSTASIS CLIP PLACEMENT  03/09/2021   Procedure: HEMOSTASIS CLIP PLACEMENT;  Surgeon: [name], MD;  Location: [facility] ENDOSCOPY;  Service: Endoscopy;;   JOINT REPLACEMENT Left    left hip   LUMBAR LAMINECTOMY/DECOMPRESSION MICRODISCECTOMY N/A 06/05/2015   Procedure: L3-L5 DECOMPRESSION ;  Surgeon: [name], MD;  Location: [facility] OR;  Service: Orthopedics;  Laterality: N/A;   OPEN REDUCTION INTERNAL FIXATION (ORIF) DISTAL RADIAL FRACTURE Right 01/10/2014   Procedure: OPEN REDUCTION INTERNAL FIXATION (ORIF) DISTAL RADIAL FRACTURE;  Surgeon: [name], MD;  Location: [facility] OR;  Service: Orthopedics;  Laterality: Right;   SPINAL CORD DECOMPRESSION  06/05/2015   L3 L 5   THYROID SURGERY     TONSILLECTOMY     TUBAL LIGATION     varicose veins stripped     WRIST FRACTURE SURGERY Bilateral     Current Medications: Outpatient Medications Prior to Visit  Medication Sig Dispense Refill   acetaminophen (TYLENOL) 500 MG tablet Take 500 mg by mouth every 6 (six) hours as needed for moderate pain.     albuterol (PROAIR  HFA) 108 (90 Base) MCG/ACT inhaler Take  2 inhalations  15 minutes apart  every 4 hours  as needed for Wheezing 48 g 3   Carboxymeth-Glycerin-Polysorb (REFRESH DIGITAL OP) Place 1 drop into the right eye daily as needed (dry eyes).     cholecalciferol (VITAMIN D3) 25 MCG (1000 UNIT) tablet Take 1,000 Units by mouth daily. M,W,F     Cyanocobalamin (B-12 SL) Place 1 tablet under the tongue daily.     diazepam (VALIUM) 2 MG tablet Take 1 tablet 3 - 4 x /day for Tremor                                     /                                 TAKE                    BY                     MOUTH 120 tablet 1   diltiazem (CARDIZEM CD) 180 MG 24 hr capsule Take 1 capsule  Daily for Heart & BP                                                                                                   /                          TAKE                                      BY  MOUTH                                                   ONCE ? DAILY (Patient taking differently: 180 mg as needed. Take 1 capsule  Daily for Heart & BP                                                                                                   /                          TAKE                                      BY                                            MOUTH                                                   ONCE ? DAILY) 90 capsule 3   Ferrous Sulfate (IRON SLOW RELEASE PO) Take by mouth.     furosemide (LASIX) 80 MG tablet Take 80 mg by mouth daily.     Melatonin 10 MG TABS Take 1 tablet by mouth daily.     polyethylene glycol (MIRALAX / GLYCOLAX) 17 g packet Take 17 g by mouth daily. 14 each 0   vitamin B-12 (CYANOCOBALAMIN) 50 MCG tablet Take 50 mcg by mouth daily.     vitamin C (ASCORBIC ACID) 500 MG tablet Take 500 mg by mouth daily.     zinc gluconate 50 MG tablet Take 25 mg by mouth daily.     amiodarone (PACERONE) 200 MG tablet Take 1 tablet by mouth once daily 90 tablet 3   apixaban (ELIQUIS) 2.5  MG TABS tablet Take 1 tablet by mouth twice daily 60 tablet 5   furosemide (LASIX) 80 MG tablet Take 1 tablet by mouth twice daily 120 tablet 3   glipiZIDE (GLUCOTROL) 5 MG tablet Take  1/2 to 1 tablet  2 x /day  with Breakfast & Supper for Diabetes (Patient not taking: Reported on 03/19/2023) 180 tablet 3   No facility-administered medications prior to visit.     Allergies:   Accupril [quinapril hcl], Neosporin [neomycin-bacitracin zn-polymyx], Prednisone, Reglan [metoclopramide], Tramadol, and Adhesive [tape]   Social History   Socioeconomic History   Marital status: Widowed    Spouse name: Not on file   Number of children: Not on file   Years of education: Not on file   Highest education level: Not on  file  Occupational History   Not on file  Tobacco Use   Smoking status: Never   Smokeless tobacco: Never  Substance and Sexual Activity   Alcohol use: No   Drug use: No   Sexual activity: Not on file  Other Topics Concern   Not on file  Social History Narrative   Not on file   Social Determinants of Health   Financial Resource Strain: Not on file  Food Insecurity: No Food Insecurity (10/31/2021)   Hunger Vital Sign    Worried About Running Out of Food in the Last Year: Never true    Ran Out of Food in the Last Year: Never true  Transportation Needs: No Transportation Needs (10/31/2021)   PRAPARE - Administrator, Civil Service (Medical): No    Lack of Transportation (Non-Medical): No  Physical Activity: Not on file  Stress: Not on file  Social Connections: Not on file     Family History:  The patient's family history includes Cancer in her brother; Heart disease in her sister; Hyperlipidemia in her mother; Hypertension in her mother.   ROS General: Negative; No fevers, chills, or night sweats;  HEENT: Negative; Positive for wearing hearing aids.  No changes in vision, sinus congestion, difficulty swallowing Pulmonary: Positive for when necessary inhalation  treatment with albuterol; No recent cough, wheezing, shortness of breath, hemoptysis Cardiovascular: See history of present illness GI: Positive for GERD, history of irritable bowel syndrome GU: Negative; No dysuria, hematuria, or difficulty voiding Musculoskeletal: Straight of lumbar stenosis Hematologic/Oncology: Negative; no easy bruising, bleeding Endocrine: Negative; no heat/cold intolerance; no diabetes Neuro: Negative; no changes in balance, headaches Skin: Negative; No rashes or skin lesions Psychiatric: Negative; No behavioral problems, depression Sleep: Negative; No snoring, daytime sleepiness, hypersomnolence, bruxism, restless legs, hypnogognic hallucinations, no cataplexy Other comprehensive 14 point system review is negative.   PHYSICAL EXAM:   VS:  BP (!) 140/79   Pulse 61   Ht 5' (1.524 m)   Wt 134 lb 6.4 oz (61 kg)   SpO2 95%   BMI 26.25 kg/m     Repeat blood pressure by me was 140/78.  Wt Readings from Last 3 Encounters:  03/19/23 134 lb 6.4 oz (61 kg)  03/11/23 139 lb 12.8 oz (63.4 kg)  11/04/22 137 lb (62.1 kg)    General: Alert, oriented, no distress.  Skin: normal turgor, no rashes, warm and dry HEENT: Normocephalic, atraumatic. Pupils equal round and reactive to light; sclera anicteric; extraocular muscles intact;  Nose without nasal septal hypertrophy Mouth/Parynx benign; Mallinpatti scale Neck: No JVD, no carotid bruits; normal carotid upstroke Lungs: clear to ausculatation and percussion; no wheezing or rales Chest wall: without tenderness to palpitation Heart: PMI not displaced, irregular rhythm, s1 s2 normal, 1/6 systolic murmur, no diastolic murmur, no rubs, gallops, thrills, or heaves Abdomen: soft, nontender; no hepatosplenomehaly, BS+; abdominal aorta nontender and not dilated by palpation. Back: no CVA tenderness Pulses 2+ Musculoskeletal: full range of motion, normal strength, no joint deformities Extremities: no clubbing cyanosis or edema,  Homan's sign negative  Neurologic: grossly nonfocal; Cranial nerves grossly wnl Psychologic: Normal mood and affect   Studies/Labs Reviewed:    Mar 19, 2023 ECG (independently read by me):  Atrial flutter with variable block   December 29, 2019 ECG (independently read by me): Sinus bradycardia at 46 bpm.  Left axis deviation.  Mild RV conduction delay.  QS V1 V3.  QTc interval 462 ms.  October 17, 2019 ECG (independently read  by me): Sinus bradycardia at 48 bpm.  Incomplete right bundle branch block.  Left anterior hemiblock.  Q waves anteroseptally  October 2018 EKG:  EKG is ordered today.  Normal sinus rhythm at 62 bpm with PAC.  Incomplete right bundle branch block.  Normal intervals  September 2018 ECG (independently read by me):Normal sinus rhythm at 63 bpm.  Incomplete right bundle branch block.  Poor anterior R-wave progression.  Normal intervals.  Recent Labs:    Latest Ref Rng & Units 03/11/2023    3:00 PM 11/03/2022    4:07 PM 09/24/2022    2:27 PM  BMP  Glucose 65 - 99 mg/dL 962  952  841   BUN 7 - 25 mg/dL 30  24  23    Creatinine 0.60 - 0.95 mg/dL 3.24  4.01  0.27   BUN/Creat Ratio 6 - 22 (calc) 18  16  16    Sodium 135 - 146 mmol/L 140  138  141   Potassium 3.5 - 5.3 mmol/L 4.2  4.0  4.1   Chloride 98 - 110 mmol/L 102  102  104   CO2 20 - 32 mmol/L 29  28  29    Calcium 8.6 - 10.4 mg/dL 8.6 - 25.3 mg/dL 66.4    40.3  47.4  25.9         Latest Ref Rng & Units 03/11/2023    3:00 PM 11/03/2022    4:07 PM 09/24/2022    2:27 PM  Hepatic Function  Total Protein 6.1 - 8.1 g/dL 6.2  6.5  6.6   AST 10 - 35 U/L 17  20  17    ALT 6 - 29 U/L 19  17  14    Total Bilirubin 0.2 - 1.2 mg/dL 0.5  0.5  0.7        Latest Ref Rng & Units 03/11/2023    3:00 PM 11/04/2022    3:23 PM 11/03/2022    4:07 PM  CBC  WBC 3.8 - 10.8 Thousand/uL 6.6  8.2  6.2   Hemoglobin 11.7 - 15.5 g/dL 56.3  87.5  64.3   Hematocrit 35.0 - 45.0 % 40.3  42.3  39.2   Platelets 140 - 400 Thousand/uL  222  250.0  229    Lab Results  Component Value Date   MCV 90.8 03/11/2023   MCV 93.8 11/04/2022   MCV 93.1 11/03/2022   Lab Results  Component Value Date   TSH 1.26 03/11/2023   Lab Results  Component Value Date   HGBA1C 7.9 (H) 03/11/2023     BNP    Component Value Date/Time   BNP 464.4 (H) 01/22/2021 1745    ProBNP No results found for: "PROBNP"   Lipid Panel     Component Value Date/Time   CHOL 186 03/11/2023 1500   TRIG 94 03/11/2023 1500   HDL 61 03/11/2023 1500   CHOLHDL 3.0 03/11/2023 1500   VLDL 15 05/04/2017 1339   LDLCALC 106 (H) 03/11/2023 1500     RADIOLOGY: No results found.   Additional studies/ records that were reviewed today include:  I reviewed the records from Qu[facility] Mulling, [place]  I have reviewed the subsequent  office visits since her last appointment with me in February 2021.  ASSESSMENT:    1. Atrial flutter, unspecified type (HCC)   2. Chronic diastolic CHF (congestive heart failure) (HCC)   3. Essential hypertension   4. Chronic kidney disease (CKD), stage IV (severe) (HCC)   5. Elevated PTHrP level  6. Anticoagulated      PLAN:  Ms. Zali Roda is a very pleasant 89 year-old female who has a longstanding history of hypertension as well as a history of hyperlipidemia, prior varicose vein surgery, GERD, and in the past had developed musculoskeletal chest wall pain.  She  developed atrial fibrillation and required cardioversion initially in February 2020 and subsequently on June 16, 2019.  When I saw her in December 2020 she was maintaining sinus rhythm on a regimen consisting of amiodarone 200 mg daily, in addition to atenolol 25 mg daily.  She was bradycardic and I recommended potential reduction of a atenolol.  She has been on Eliquis as well as baby aspirin.  She does not have underlying known CAD and I recommended she discontinue aspirin and continue with Eliquis 5 mg twice a day alone to reduce bleed risk.  When last seen by  me in 2021, she was bradycardic and atenolol was reduced.  When last seen by Edd Fabian, NP in November 2023 following an ER visit with abdominal pain, no ECG was obtained but her rhythm was reportedly regular.  Her blood pressure was stable.  Presently, Ms. Orpilla is in atrial flutter with variable block despite being on her current dose of amiodarone.  I am recommending for the next week that she increase amiodarone from 200 mg daily and take this twice a day.  After 1 week of this increased dosing she will then decrease it down to 300 mg a day.  I am scheduling her for follow-up echo Doppler study.  I am also scheduling her to be seen in A-fib clinic in approximately 3 to 4 weeks.  She is anticoagulated on Eliquis at reduced dose 2.5 mg twice a day.  With her creatinine at 1.68 and GFR at 29, I recommended that she reduce her Lasix of 80 mg twice a day and just take this on a daily basis.  I have reviewed the lab work and the evaluation of Dr. Oneta Rack.  It appears that her parathyroid hormone was markedly elevated at 277 and I have recommended she contact their office regarding this.  We will try to arrange her echo Doppler study to be done on May 21 when one of her daughters who is out of state can be here to take her for that study.    Medication Adjustments/Labs and Tests Ordered: Current medicines are reviewed at length with the patient today.  Concerns regarding medicines are outlined above.  Medication changes, Labs and Tests ordered today are listed in the Patient Instructions below. Patient Instructions  Medication Instructions:  Decrease Lasix to 80 mg daily Increase Amiodarone to 200 mg twice a day for 1 week, then decrease to 300 mg (1.5 tablets) daily   *If you need a refill on your cardiac medications before your next appointment, please call your pharmacy*   Lab Work: None ordered   If you have labs (blood work) drawn today and your tests are completely normal, you will receive  your results only by: MyChart Message (if you have MyChart) OR A paper copy in the mail If you have any lab test that is abnormal or we need to change your treatment, we will call you to review the results.   Testing/Procedures: Your physician has requested that you have an echocardiogram. Echocardiography is a painless test that uses sound waves to create images of your heart. It provides your doctor with information about the size and shape of your heart and how  well your heart's chambers and valves are working. This procedure takes approximately one hour. There are no restrictions for this procedure. Please do NOT wear cologne, perfume, aftershave, or lotions (deodorant is allowed). Please arrive 15 minutes prior to your appointment time.    Follow-Up: Your physician has referred you to A-Fib clinic   Other Instructions     Signed, Nicki Guadalajara, MD  03/21/2023 4:50 PM    [facility] [address], [place], [place]  [address] Phone: [phone]

## 2023-03-19 NOTE — Telephone Encounter (Signed)
Prescription refill request for Eliquis received. Indication: Aflutter Last office visit: 03/19/23 Tresa Endo)  Scr: 1.68 (03/11/23)  Age: 89 Weight: 61kg  Appropriate dose. Refill sent.

## 2023-03-21 ENCOUNTER — Encounter: Payer: Self-pay | Admitting: Cardiovascular Disease

## 2023-03-31 ENCOUNTER — Ambulatory Visit
Admission: RE | Admit: 2023-03-31 | Discharge: 2023-03-31 | Disposition: A | Discharge status: Home / Self Care | Payer: Medicare Other | Source: Ambulatory Visit | Attending: Internal Medicine | Admitting: Internal Medicine

## 2023-03-31 DIAGNOSIS — N1832 Chronic kidney disease, stage 3b: Secondary | ICD-10-CM | POA: Diagnosis not present

## 2023-03-31 DIAGNOSIS — Z1231 Encounter for screening mammogram for malignant neoplasm of breast: Secondary | ICD-10-CM

## 2023-04-05 DIAGNOSIS — D2221 Melanocytic nevi of right ear and external auricular canal: Secondary | ICD-10-CM | POA: Diagnosis not present

## 2023-04-05 DIAGNOSIS — Z85828 Personal history of other malignant neoplasm of skin: Secondary | ICD-10-CM | POA: Diagnosis not present

## 2023-04-05 DIAGNOSIS — E1122 Type 2 diabetes mellitus with diabetic chronic kidney disease: Secondary | ICD-10-CM | POA: Diagnosis not present

## 2023-04-05 DIAGNOSIS — L814 Other melanin hyperpigmentation: Secondary | ICD-10-CM | POA: Diagnosis not present

## 2023-04-05 DIAGNOSIS — I5032 Chronic diastolic (congestive) heart failure: Secondary | ICD-10-CM | POA: Diagnosis not present

## 2023-04-05 DIAGNOSIS — L570 Actinic keratosis: Secondary | ICD-10-CM | POA: Diagnosis not present

## 2023-04-05 DIAGNOSIS — Z85820 Personal history of malignant melanoma of skin: Secondary | ICD-10-CM | POA: Diagnosis not present

## 2023-04-05 DIAGNOSIS — I480 Paroxysmal atrial fibrillation: Secondary | ICD-10-CM | POA: Diagnosis not present

## 2023-04-05 DIAGNOSIS — N1832 Chronic kidney disease, stage 3b: Secondary | ICD-10-CM | POA: Diagnosis not present

## 2023-04-05 DIAGNOSIS — I129 Hypertensive chronic kidney disease with stage 1 through stage 4 chronic kidney disease, or unspecified chronic kidney disease: Secondary | ICD-10-CM | POA: Diagnosis not present

## 2023-04-05 DIAGNOSIS — L820 Inflamed seborrheic keratosis: Secondary | ICD-10-CM | POA: Diagnosis not present

## 2023-04-05 DIAGNOSIS — L821 Other seborrheic keratosis: Secondary | ICD-10-CM | POA: Diagnosis not present

## 2023-04-05 DIAGNOSIS — N2581 Secondary hyperparathyroidism of renal origin: Secondary | ICD-10-CM | POA: Diagnosis not present

## 2023-04-08 ENCOUNTER — Ambulatory Visit (HOSPITAL_BASED_OUTPATIENT_CLINIC_OR_DEPARTMENT_OTHER)
Admission: RE | Admit: 2023-04-08 | Discharge: 2023-04-08 | Disposition: A | Discharge status: Home / Self Care | Payer: Medicare Other | Source: Ambulatory Visit | Attending: Cardiovascular Disease | Admitting: Cardiovascular Disease

## 2023-04-08 DIAGNOSIS — R0609 Other forms of dyspnea: Secondary | ICD-10-CM | POA: Diagnosis not present

## 2023-04-08 DIAGNOSIS — R072 Precordial pain: Secondary | ICD-10-CM | POA: Diagnosis not present

## 2023-04-08 DIAGNOSIS — I4892 Unspecified atrial flutter: Secondary | ICD-10-CM | POA: Diagnosis not present

## 2023-04-08 LAB — ECHOCARDIOGRAM COMPLETE
AR max vel: 1.48 cm2
AV Area VTI: 1.88 cm2
AV Area mean vel: 1.49 cm2
AV Mean grad: 3 mmHg
AV Peak grad: 5.6 mmHg
Ao pk vel: 1.18 m/s
Area-P 1/2: 3.85 cm2
P 1/2 time: 464 msec
Radius: 0.5 cm
S' Lateral: 3.1 cm

## 2023-04-09 ENCOUNTER — Ambulatory Visit: Payer: Medicare Other

## 2023-04-15 ENCOUNTER — Ambulatory Visit (HOSPITAL_COMMUNITY)
Admission: RE | Admit: 2023-04-15 | Discharge: 2023-04-15 | Disposition: A | Discharge status: Home / Self Care | Payer: Medicare Other | Source: Ambulatory Visit | Attending: Physician Assistant | Admitting: Physician Assistant

## 2023-04-15 VITALS — BP 156/80 | HR 75 | Ht 60.0 in | Wt 134.2 lb

## 2023-04-15 DIAGNOSIS — Z79899 Other long term (current) drug therapy: Secondary | ICD-10-CM | POA: Diagnosis not present

## 2023-04-15 DIAGNOSIS — Z7901 Long term (current) use of anticoagulants: Secondary | ICD-10-CM | POA: Diagnosis not present

## 2023-04-15 DIAGNOSIS — I5032 Chronic diastolic (congestive) heart failure: Secondary | ICD-10-CM | POA: Insufficient documentation

## 2023-04-15 DIAGNOSIS — I080 Rheumatic disorders of both mitral and aortic valves: Secondary | ICD-10-CM | POA: Insufficient documentation

## 2023-04-15 DIAGNOSIS — I4819 Other persistent atrial fibrillation: Secondary | ICD-10-CM | POA: Diagnosis not present

## 2023-04-15 DIAGNOSIS — Z5181 Encounter for therapeutic drug level monitoring: Secondary | ICD-10-CM | POA: Diagnosis not present

## 2023-04-15 DIAGNOSIS — D6869 Other thrombophilia: Secondary | ICD-10-CM

## 2023-04-15 DIAGNOSIS — I484 Atypical atrial flutter: Secondary | ICD-10-CM | POA: Diagnosis not present

## 2023-04-15 DIAGNOSIS — I110 Hypertensive heart disease with heart failure: Secondary | ICD-10-CM | POA: Insufficient documentation

## 2023-04-15 DIAGNOSIS — I4510 Unspecified right bundle-branch block: Secondary | ICD-10-CM | POA: Insufficient documentation

## 2023-04-15 LAB — CBC
HCT: 44.8 % (ref 36.0–46.0)
Hemoglobin: 14.3 g/dL (ref 12.0–15.0)
MCH: 31.6 pg (ref 26.0–34.0)
MCHC: 31.9 g/dL (ref 30.0–36.0)
MCV: 98.9 fL (ref 80.0–100.0)
Platelets: 209 10*3/uL (ref 150–400)
RBC: 4.53 MIL/uL (ref 3.87–5.11)
RDW: 14.4 % (ref 11.5–15.5)
WBC: 7 10*3/uL (ref 4.0–10.5)
nRBC: 0 % (ref 0.0–0.2)

## 2023-04-15 LAB — BASIC METABOLIC PANEL
Anion gap: 10 (ref 5–15)
BUN: 24 mg/dL — ABNORMAL HIGH (ref 8–23)
CO2: 25 mmol/L (ref 22–32)
Calcium: 9.8 mg/dL (ref 8.9–10.3)
Chloride: 104 mmol/L (ref 98–111)
Creatinine, Ser: 1.38 mg/dL — ABNORMAL HIGH (ref 0.44–1.00)
GFR, Estimated: 37 mL/min — ABNORMAL LOW (ref >60)
Glucose, Bld: 123 mg/dL — ABNORMAL HIGH (ref 70–99)
Potassium: 4.1 mmol/L (ref 3.5–5.1)
Sodium: 139 mmol/L (ref 135–145)

## 2023-04-15 MED ORDER — RIVAROXABAN 15 MG PO TABS: 15.0000 mg | ORAL_TABLET | Freq: Every day | ORAL | 6 refills | Status: DC

## 2023-04-15 NOTE — Progress Notes (Signed)
Primary Care Physician: [name], MD Primary Cardiologist: [name] Primary Electrophysiologist: none Referring Physician: [name] is a 89 y.o. female with a history of HTN, HLD, DM, chronic HFpEF, atrial fibrillation who presents for consultation in the [facility].  The patient was initially diagnosed with atrial fibrillation in 2020 and underwent DCCV x 2. She has been maintained on amiodarone. Patient is on Eliquis for a CHADS2VASC score of 6. She was seen by [name] on 03/19/23 and found to be in rate controlled atrial flutter. Her amiodarone was increased and she was referred to the AF clinic.   On follow up today, patient reports that she has noticed over the last few months she has been more fatigued and SOB on her walks. There were no specific triggers for her afib that she could identify. No bleeding issues on anticoagulation.   Today, she denies symptoms of palpitations, chest pain, shortness of breath, orthopnea, PND, lower extremity edema, dizziness, presyncope, syncope, snoring, daytime somnolence, bleeding, or neurologic sequela. The patient is tolerating medications without difficulties and is otherwise without complaint today.    Atrial Fibrillation Risk Factors:  she does not have symptoms or diagnosis of sleep apnea. she does not have a history of rheumatic fever. she does not have a history of alcohol use. The patient does not have a history of early familial atrial fibrillation or other arrhythmias.  she has a BMI of Body mass index is 26.21 kg/m.Marland Kitchen Filed Weights   04/15/23 1053  Weight: 60.9 kg    Family History  Problem Relation Age of Onset   Hypertension Mother    Hyperlipidemia Mother    Heart disease Sister    Cancer Brother        prostate   Colon cancer Neg Hx    Esophageal cancer Neg Hx    Pancreatic cancer Neg Hx    Stomach cancer Neg Hx    Liver disease Neg Hx    Breast cancer Neg Hx       Atrial Fibrillation Management history:  Previous antiarrhythmic drugs: amiodarone  Previous cardioversions: 2020 x 2 Previous ablations: none Anticoagulation history: Eliquis   Past Medical History:  Diagnosis Date   Acute on chronic diastolic (congestive) heart failure (HCC) 11/21/2018   Cancer (HCC)    skin cancer   DDD (degenerative disc disease)    Diverticula of colon    Diverticulitis    DJD (degenerative joint disease)    GERD (gastroesophageal reflux disease)    takes ranitidine   Heart murmur    History of hiatal hernia    History of pneumonia    Hx of irritable bowel syndrome    Hyperlipidemia    Hypertension    Occasional tremors    Osteoporosis    Pre-diabetes    Varicose veins    Vitamin D deficiency    Past Surgical History:  Procedure Laterality Date   APPENDECTOMY     BREAST EXCISIONAL BIOPSY Right    BREAST SURGERY Right    fluid drained from breast   CARDIOVERSION N/A 12/20/2018   Procedure: CARDIOVERSION;  Surgeon: [name], MD;  Location: [facility] ENDOSCOPY;  Service: Cardiovascular;  Laterality: N/A;   CARDIOVERSION N/A 06/16/2019   Procedure: CARDIOVERSION;  Surgeon: [name], MD;  Location: [facility] ENDOSCOPY;  Service: Cardiovascular;  Laterality: N/A;   COLONOSCOPY     ESOPHAGOGASTRODUODENOSCOPY     FLEXIBLE SIGMOIDOSCOPY N/A 03/09/2021   Procedure: FLEXIBLE  SIGMOIDOSCOPY;  Surgeon: [name], MD;  Location: [facility] ENDOSCOPY;  Service: Endoscopy;  Laterality: N/A;   HEMOSTASIS CLIP PLACEMENT  03/09/2021   Procedure: HEMOSTASIS CLIP PLACEMENT;  Surgeon: [name], MD;  Location: [facility] ENDOSCOPY;  Service: Endoscopy;;   JOINT REPLACEMENT Left    left hip   LUMBAR LAMINECTOMY/DECOMPRESSION MICRODISCECTOMY N/A 06/05/2015   Procedure: L3-L5 DECOMPRESSION ;  Surgeon: [name], MD;  Location: [facility] OR;  Service: Orthopedics;  Laterality: N/A;   OPEN REDUCTION INTERNAL FIXATION (ORIF) DISTAL RADIAL FRACTURE Right 01/10/2014    Procedure: OPEN REDUCTION INTERNAL FIXATION (ORIF) DISTAL RADIAL FRACTURE;  Surgeon: [name], MD;  Location: [facility] OR;  Service: Orthopedics;  Laterality: Right;   SPINAL CORD DECOMPRESSION  06/05/2015   L3 L 5   THYROID SURGERY     TONSILLECTOMY     TUBAL LIGATION     varicose veins stripped     WRIST FRACTURE SURGERY Bilateral     Current Outpatient Medications  Medication Sig Dispense Refill   acetaminophen (TYLENOL) 500 MG tablet Take 500 mg by mouth every 6 (six) hours as needed for moderate pain.     albuterol (PROAIR HFA) 108 (90 Base) MCG/ACT inhaler Take  2 inhalations  15 minutes apart  every 4 hours  as needed for Wheezing 48 g 3   amiodarone (PACERONE) 200 MG tablet Take 1.5 tablets (300 mg total) by mouth daily. 180 tablet 3   apixaban (ELIQUIS) 2.5 MG TABS tablet Take 1 tablet by mouth twice daily 60 tablet 5   Boswellia-Glucosamine-Vit D (OSTEO BI-FLEX ONE PER DAY PO) Take 1 tablet by mouth 2 (two) times daily.     Carboxymeth-Glycerin-Polysorb (REFRESH DIGITAL OP) Place 1 drop into the right eye daily as needed (dry eyes).     cholecalciferol (VITAMIN D3) 25 MCG (1000 UNIT) tablet Take 1,000 Units by mouth daily. M,W,F     diazepam (VALIUM) 2 MG tablet Take 1 tablet 3 - 4 x /day for Tremor                                     /                                 TAKE                    BY                     MOUTH 120 tablet 1   diltiazem (CARDIZEM CD) 180 MG 24 hr capsule Take 1 capsule  Daily for Heart & BP                                                                                                   /                          TAKE  BY                                            MOUTH                                                   ONCE ? DAILY (Patient taking differently: 180 mg daily in the afternoon. Take 1 capsule  Daily for Heart & BP                                                                                                   /                           TAKE                                      BY                                            MOUTH                                                   ONCE ? DAILY) 90 capsule 3   Ferrous Sulfate (IRON SLOW RELEASE PO) Take 1 tablet by mouth at bedtime.     furosemide (LASIX) 80 MG tablet Take 80 mg by mouth daily.     Melatonin 10 MG TABS Take 1 tablet by mouth daily.     polyethylene glycol (MIRALAX / GLYCOLAX) 17 g packet Take 17 g by mouth daily. 14 each 0   vitamin B-12 (CYANOCOBALAMIN) 50 MCG tablet Take 50 mcg by mouth daily.     vitamin C (ASCORBIC ACID) 500 MG tablet Take 500 mg by mouth daily.     zinc gluconate 50 MG tablet Take 25 mg by mouth daily.     No current facility-administered medications for this encounter.    Allergies  Allergen Reactions   Accupril [Quinapril Hcl] Cough   Neosporin [Neomycin-Bacitracin Zn-Polymyx]     unknown   Prednisone     Agitated and shaky   Reglan [Metoclopramide] Other (See Comments)    tremors   Tramadol Nausea And Vomiting   Adhesive [Tape] Rash    Social History   Socioeconomic History   Marital status: Widowed    Spouse name: Not on file   Number of children: Not on file   Years of education: Not on file   Highest education level:  Not on file  Occupational History   Not on file  Tobacco Use   Smoking status: Never   Smokeless tobacco: Never  Substance and Sexual Activity   Alcohol use: No   Drug use: No   Sexual activity: Not on file  Other Topics Concern   Not on file  Social History Narrative   Not on file   Social Determinants of Health   Financial Resource Strain: Not on file  Food Insecurity: No Food Insecurity (10/31/2021)   Hunger Vital Sign    Worried About Running Out of Food in the Last Year: Never true    Ran Out of Food in the Last Year: Never true  Transportation Needs: No Transportation Needs (10/31/2021)   PRAPARE - Administrator, Civil Service (Medical): No    Lack  of Transportation (Non-Medical): No  Physical Activity: Not on file  Stress: Not on file  Social Connections: Not on file  Intimate Partner Violence: Not on file     ROS- All systems are reviewed and negative except as per the HPI above.  Physical Exam: Vitals:   04/15/23 1053  BP: (!) 156/80  Pulse: 75  Weight: 60.9 kg  Height: 5' (1.524 m)    GEN- The patient is a well appearing elderly female, alert and oriented x 3 today.   Head- normocephalic, atraumatic Eyes-  Sclera clear, conjunctiva pink Ears- hearing intact Oropharynx- clear Neck- supple  Lungs- Clear to ausculation bilaterally, normal work of breathing Heart- irregular rate and rhythm, no murmurs, rubs or gallops  GI- soft, NT, ND, + BS Extremities- no clubbing, cyanosis, or edema MS- no significant deformity or atrophy Skin- no rash or lesion Psych- euthymic mood, full affect Neuro- strength and sensation are intact  Wt Readings from Last 3 Encounters:  04/15/23 60.9 kg  03/19/23 61 kg  03/11/23 63.4 kg    EKG today demonstrates  Atypical atrial flutter with variable block, RBBB Vent. rate 75 BPM PR interval * ms QRS duration 124 ms QT/QTcB 392/437 ms  Echo 04/08/23 demonstrated  1. Atrial flutter controlled rate. Left ventricular ejection fraction, by estimation, is 60 to 65%. The left ventricle has normal function. The left ventricle has no regional wall motion abnormalities. There is mild concentric left ventricular hypertrophy. Left ventricular diastolic  parameters are indeterminate.   2. Right ventricular systolic function is mildly reduced. The right  ventricular size is normal. There is normal pulmonary artery systolic  pressure.   3. Left atrial size was severely dilated.   4. The mitral valve is normal in structure. Mild mitral valve  regurgitation. No evidence of mitral stenosis.   5. The aortic valve is tricuspid. There is mild thickening of the aortic  valve. Aortic valve regurgitation  is mild. No aortic stenosis is present.   6. Aortic Normal DTA.   7. The inferior vena cava is normal in size with greater than 50%  respiratory variability, suggesting right atrial pressure of 3 mmHg.   Comparison(s): EF 55%, mild LVH, mild-mod MR, RVSP 53.4 mmHg.   Epic records are reviewed at length today.  CHA2DS2-VASc Score = 6  The patient's score is based upon: CHF History: 1 HTN History: 1 Diabetes History: 1 Stroke History: 0 Vascular Disease History: 0 Age Score: 2 Gender Score: 1       ASSESSMENT AND PLAN: 1. Persistent Atrial Fibrillation/atrial flutter The patient's CHA2DS2-VASc score is 6, indicating a 9.7% annual risk of stroke.   Patient in rate  controlled atrial flutter, suspect she has been persistent for the past few months based on her symptoms. We discussed rhythm control options. Will arrange for DCCV.  She is currently on Eliquis 2.5 mg BID. On lab work today, her Cr has improved to 1.38 and she qualifies for 5 mg dosing. Since her Cr is often above and below 1.5, will change to Xarelto 15 mg daily to avoid ambiguity in dosing. Will scheduled DCCV 3 weeks after starting Xarelto.  Continue amiodarone 300 mg daily Continue diltiazem 180 mg daily  2. Secondary Hypercoagulable State (ICD10:  D68.69) The patient is at significant risk for stroke/thromboembolism based upon her CHA2DS2-VASc Score of 6.  Continue Apixaban (Eliquis).   3. Chronic HFpEF Fluid status appears stable today.  4. HTN Elevated today, will reassess in SR.    Follow up in the AF clinic post DCCV.    Jorja Loa PA-C Afib Clinic [facility] [address] [place], [place] [address] [phone] 04/15/2023 11:48 AM

## 2023-04-16 ENCOUNTER — Telehealth: Payer: Self-pay | Admitting: Cardiovascular Disease

## 2023-04-16 ENCOUNTER — Other Ambulatory Visit (HOSPITAL_COMMUNITY): Payer: Self-pay | Admitting: *Deleted

## 2023-04-16 ENCOUNTER — Ambulatory Visit (HOSPITAL_COMMUNITY): Admission: RE | Admit: 2023-04-16 | Payer: Medicare Other | Source: Home / Self Care | Admitting: Cardiology

## 2023-04-16 ENCOUNTER — Encounter (HOSPITAL_COMMUNITY): Admission: RE | Payer: Self-pay | Source: Home / Self Care

## 2023-04-16 DIAGNOSIS — I484 Atypical atrial flutter: Secondary | ICD-10-CM

## 2023-04-16 SURGERY — CARDIOVERSION
Anesthesia: Monitor Anesthesia Care

## 2023-04-16 NOTE — Telephone Encounter (Signed)
Patient's daughter is returning call to discuss echo results. 

## 2023-04-16 NOTE — Telephone Encounter (Signed)
Daughter aware of patient echo results

## 2023-04-20 ENCOUNTER — Other Ambulatory Visit: Payer: Self-pay | Admitting: Cardiovascular Disease

## 2023-04-22 ENCOUNTER — Other Ambulatory Visit: Payer: Self-pay | Admitting: Cardiovascular Disease

## 2023-04-29 ENCOUNTER — Ambulatory Visit (HOSPITAL_COMMUNITY): Payer: Medicare Other | Admitting: Physician Assistant

## 2023-05-13 ENCOUNTER — Ambulatory Visit (HOSPITAL_COMMUNITY)
Admission: RE | Admit: 2023-05-13 | Discharge: 2023-05-13 | Disposition: A | Discharge status: Home / Self Care | Payer: Medicare Other | Source: Ambulatory Visit | Attending: Physician Assistant | Admitting: Physician Assistant

## 2023-05-13 VITALS — BP 142/82 | HR 79 | Ht 60.0 in | Wt 132.6 lb

## 2023-05-13 DIAGNOSIS — E785 Hyperlipidemia, unspecified: Secondary | ICD-10-CM | POA: Insufficient documentation

## 2023-05-13 DIAGNOSIS — Z79899 Other long term (current) drug therapy: Secondary | ICD-10-CM | POA: Diagnosis not present

## 2023-05-13 DIAGNOSIS — I110 Hypertensive heart disease with heart failure: Secondary | ICD-10-CM | POA: Diagnosis not present

## 2023-05-13 DIAGNOSIS — Z7901 Long term (current) use of anticoagulants: Secondary | ICD-10-CM | POA: Diagnosis not present

## 2023-05-13 DIAGNOSIS — I5032 Chronic diastolic (congestive) heart failure: Secondary | ICD-10-CM | POA: Insufficient documentation

## 2023-05-13 DIAGNOSIS — I4819 Other persistent atrial fibrillation: Secondary | ICD-10-CM | POA: Insufficient documentation

## 2023-05-13 DIAGNOSIS — E118 Type 2 diabetes mellitus with unspecified complications: Secondary | ICD-10-CM | POA: Diagnosis not present

## 2023-05-13 DIAGNOSIS — D6869 Other thrombophilia: Secondary | ICD-10-CM | POA: Insufficient documentation

## 2023-05-13 DIAGNOSIS — Z5181 Encounter for therapeutic drug level monitoring: Secondary | ICD-10-CM

## 2023-05-13 DIAGNOSIS — I484 Atypical atrial flutter: Secondary | ICD-10-CM | POA: Insufficient documentation

## 2023-05-13 LAB — CBC
HCT: 41.4 % (ref 36.0–46.0)
Hemoglobin: 13.3 g/dL (ref 12.0–15.0)
MCH: 31.3 pg (ref 26.0–34.0)
MCHC: 32.1 g/dL (ref 30.0–36.0)
MCV: 97.4 fL (ref 80.0–100.0)
Platelets: 187 10*3/uL (ref 150–400)
RBC: 4.25 MIL/uL (ref 3.87–5.11)
RDW: 14.5 % (ref 11.5–15.5)
WBC: 6 10*3/uL (ref 4.0–10.5)
nRBC: 0 % (ref 0.0–0.2)

## 2023-05-13 LAB — BASIC METABOLIC PANEL
Anion gap: 10 (ref 5–15)
BUN: 23 mg/dL (ref 8–23)
CO2: 24 mmol/L (ref 22–32)
Calcium: 9.3 mg/dL (ref 8.9–10.3)
Chloride: 104 mmol/L (ref 98–111)
Creatinine, Ser: 1.3 mg/dL — ABNORMAL HIGH (ref 0.44–1.00)
GFR, Estimated: 40 mL/min — ABNORMAL LOW (ref >60)
Glucose, Bld: 124 mg/dL — ABNORMAL HIGH (ref 70–99)
Potassium: 4 mmol/L (ref 3.5–5.1)
Sodium: 138 mmol/L (ref 135–145)

## 2023-05-13 NOTE — Patient Instructions (Signed)
Amiodarone 200mg  a day after cardioversion

## 2023-05-13 NOTE — H&P (View-Only) (Signed)
  Primary Care Physician: McKeown, William, MD Primary Cardiologist: Dr Kelly Primary Electrophysiologist: none Referring Physician: Dr Kelly   Briana [name] is a 89 y.o. female with a history of HTN, HLD, DM, chronic HFpEF, atrial fibrillation who presents for consultation in the [facility].  The patient was initially diagnosed with atrial fibrillation in 2020 and underwent DCCV x 2. She has been maintained on amiodarone. Patient is on Eliquis for a CHADS2VASC score of 6. She was seen by Dr Kelly on 03/19/23 and found to be in rate controlled atrial flutter. Her amiodarone was increased and she was referred to the AF clinic. Her Eliquis was changed to Xarelto since her Cr was vacillating around 1.5.    On follow up today, patient remains in rate controlled atypical atrial flutter with symptoms of fatigue on exertion. She has not missed any doses of anticoagulation in the past 3 weeks.   Today, she denies symptoms of palpitations, chest pain, shortness of breath, orthopnea, PND, lower extremity edema, dizziness, presyncope, syncope, snoring, daytime somnolence, bleeding, or neurologic sequela. The patient is tolerating medications without difficulties and is otherwise without complaint today.    Atrial Fibrillation Risk Factors:  she does not have symptoms or diagnosis of sleep apnea. she does not have a history of rheumatic fever. she does not have a history of alcohol use. The patient does not have a history of early familial atrial fibrillation or other arrhythmias.   Atrial Fibrillation Management history:  Previous antiarrhythmic drugs: amiodarone  Previous cardioversions: 2020 x 2 Previous ablations: none Anticoagulation history: Eliquis, Xarelto    Past Medical History:  Diagnosis Date   Acute on chronic diastolic (congestive) heart failure (HCC) 11/21/2018   Cancer (HCC)    skin cancer   DDD (degenerative disc disease)    Diverticula of colon     Diverticulitis    DJD (degenerative joint disease)    GERD (gastroesophageal reflux disease)    takes ranitidine   Heart murmur    History of hiatal hernia    History of pneumonia    Hx of irritable bowel syndrome    Hyperlipidemia    Hypertension    Occasional tremors    Osteoporosis    Pre-diabetes    Varicose veins    Vitamin D deficiency     ROS- All systems are reviewed and negative except as per the HPI above.  Physical Exam: Vitals:   05/13/23 1342  BP: (!) 142/82  Pulse: 79  Weight: 60.1 kg  Height: 5' (1.524 m)     GEN: Well nourished, well developed in no acute distress NECK: No JVD; No carotid bruits CARDIAC: Irregularly irregular rate and rhythm, no murmurs, rubs, gallops RESPIRATORY:  Clear to auscultation without rales, wheezing or rhonchi  ABDOMEN: Soft, non-tender, non-distended EXTREMITIES:  No edema; No deformity    Wt Readings from Last 3 Encounters:  05/13/23 60.1 kg  04/15/23 60.9 kg  03/19/23 61 kg    EKG today demonstrates  Atypical atrial flutter with variable block, inc RBBB, LPFB Vent. rate 79 BPM PR interval * ms QRS duration 110 ms QT/QTcB 412/472 ms  Echo 04/08/23 demonstrated  1. Atrial flutter controlled rate. Left ventricular ejection fraction, by estimation, is 60 to 65%. The left ventricle has normal function. The left ventricle has no regional wall motion abnormalities. There is mild concentric left ventricular hypertrophy. Left ventricular diastolic  parameters are indeterminate.   2. Right ventricular systolic function is mildly reduced. The   right  ventricular size is normal. There is normal pulmonary artery systolic  pressure.   3. Left atrial size was severely dilated.   4. The mitral valve is normal in structure. Mild mitral valve  regurgitation. No evidence of mitral stenosis.   5. The aortic valve is tricuspid. There is mild thickening of the aortic  valve. Aortic valve regurgitation is mild. No aortic stenosis is  present.   6. Aortic Normal DTA.   7. The inferior vena cava is normal in size with greater than 50%  respiratory variability, suggesting right atrial pressure of 3 mmHg.   Comparison(s): EF 55%, mild LVH, mild-mod MR, RVSP 53.4 mmHg.   Epic records are reviewed at length today.  CHA2DS2-VASc Score = 6  The patient's score is based upon: CHF History: 1 HTN History: 1 Diabetes History: 1 Stroke History: 0 Vascular Disease History: 0 Age Score: 2 Gender Score: 1       ASSESSMENT AND PLAN: Persistent Atrial Fibrillation/atrial flutter The patient's CHA2DS2-VASc score is 6, indicating a 9.7% annual risk of stroke.   Patient remains in atrial flutter Scheduled for DCCV on 7/1 Continue Xarelto 15 mg daily Check cbc/bmet today Continue amiodarone 300 mg daily, will decrease back to 200 mg daily post DCCV.  Continue diltiazem 180 mg daily  Secondary Hypercoagulable State (ICD10:  D68.69) The patient is at significant risk for stroke/thromboembolism based upon her CHA2DS2-VASc Score of 6.  Continue Apixaban (Eliquis).   Chronic HFpEF Appears euvolemic today  HTN Stable on current regimen   Follow up in the AF clinic post DCCV.    Informed Consent   Shared Decision Making/Informed Consent The risks (stroke, cardiac arrhythmias rarely resulting in the need for a temporary or permanent pacemaker, skin irritation or burns and complications associated with conscious sedation including aspiration, arrhythmia, respiratory failure and death), benefits (restoration of normal sinus rhythm) and alternatives of a direct current cardioversion were explained in detail to Ms. Peters and she agrees to proceed.       Ricky Maleny Candy PA-C Afib [facility] [address] [place], [place] [address] [phone] 05/13/2023 2:06 PM 

## 2023-05-13 NOTE — Progress Notes (Signed)
Primary Care Physician: [name], MD Primary Cardiologist: [name] Primary Electrophysiologist: none Referring Physician: Dr Yates Decamp is a 89 y.o. female with a history of HTN, HLD, DM, chronic HFpEF, atrial fibrillation who presents for consultation in the [facility].  The patient was initially diagnosed with atrial fibrillation in 2020 and underwent DCCV x 2. She has been maintained on amiodarone. Patient is on Eliquis for a CHADS2VASC score of 6. She was seen by [name] on 03/19/23 and found to be in rate controlled atrial flutter. Her amiodarone was increased and she was referred to the AF clinic. Her Eliquis was changed to Xarelto since her Cr was vacillating around 1.5.    On follow up today, patient remains in rate controlled atypical atrial flutter with symptoms of fatigue on exertion. She has not missed any doses of anticoagulation in the past 3 weeks.   Today, she denies symptoms of palpitations, chest pain, shortness of breath, orthopnea, PND, lower extremity edema, dizziness, presyncope, syncope, snoring, daytime somnolence, bleeding, or neurologic sequela. The patient is tolerating medications without difficulties and is otherwise without complaint today.    Atrial Fibrillation Risk Factors:  she does not have symptoms or diagnosis of sleep apnea. she does not have a history of rheumatic fever. she does not have a history of alcohol use. The patient does not have a history of early familial atrial fibrillation or other arrhythmias.   Atrial Fibrillation Management history:  Previous antiarrhythmic drugs: amiodarone  Previous cardioversions: 2020 x 2 Previous ablations: none Anticoagulation history: Eliquis, Xarelto    Past Medical History:  Diagnosis Date   Acute on chronic diastolic (congestive) heart failure (HCC) 11/21/2018   Cancer (HCC)    skin cancer   DDD (degenerative disc disease)    Diverticula of colon     Diverticulitis    DJD (degenerative joint disease)    GERD (gastroesophageal reflux disease)    takes ranitidine   Heart murmur    History of hiatal hernia    History of pneumonia    Hx of irritable bowel syndrome    Hyperlipidemia    Hypertension    Occasional tremors    Osteoporosis    Pre-diabetes    Varicose veins    Vitamin D deficiency     ROS- All systems are reviewed and negative except as per the HPI above.  Physical Exam: Vitals:   05/13/23 1342  BP: (!) 142/82  Pulse: 79  Weight: 60.1 kg  Height: 5' (1.524 m)     GEN: Well nourished, well developed in no acute distress NECK: No JVD; No carotid bruits CARDIAC: Irregularly irregular rate and rhythm, no murmurs, rubs, gallops RESPIRATORY:  Clear to auscultation without rales, wheezing or rhonchi  ABDOMEN: Soft, non-tender, non-distended EXTREMITIES:  No edema; No deformity    Wt Readings from Last 3 Encounters:  05/13/23 60.1 kg  04/15/23 60.9 kg  03/19/23 61 kg    EKG today demonstrates  Atypical atrial flutter with variable block, inc RBBB, LPFB Vent. rate 79 BPM PR interval * ms QRS duration 110 ms QT/QTcB 412/472 ms  Echo 04/08/23 demonstrated  1. Atrial flutter controlled rate. Left ventricular ejection fraction, by estimation, is 60 to 65%. The left ventricle has normal function. The left ventricle has no regional wall motion abnormalities. There is mild concentric left ventricular hypertrophy. Left ventricular diastolic  parameters are indeterminate.   2. Right ventricular systolic function is mildly reduced. The  right  ventricular size is normal. There is normal pulmonary artery systolic  pressure.   3. Left atrial size was severely dilated.   4. The mitral valve is normal in structure. Mild mitral valve  regurgitation. No evidence of mitral stenosis.   5. The aortic valve is tricuspid. There is mild thickening of the aortic  valve. Aortic valve regurgitation is mild. No aortic stenosis is  present.   6. Aortic Normal DTA.   7. The inferior vena cava is normal in size with greater than 50%  respiratory variability, suggesting right atrial pressure of 3 mmHg.   Comparison(s): EF 55%, mild LVH, mild-mod MR, RVSP 53.4 mmHg.   Epic records are reviewed at length today.  CHA2DS2-VASc Score = 6  The patient's score is based upon: CHF History: 1 HTN History: 1 Diabetes History: 1 Stroke History: 0 Vascular Disease History: 0 Age Score: 2 Gender Score: 1       ASSESSMENT AND PLAN: Persistent Atrial Fibrillation/atrial flutter The patient's CHA2DS2-VASc score is 6, indicating a 9.7% annual risk of stroke.   Patient remains in atrial flutter Scheduled for DCCV on 7/1 Continue Xarelto 15 mg daily Check cbc/bmet today Continue amiodarone 300 mg daily, will decrease back to 200 mg daily post DCCV.  Continue diltiazem 180 mg daily  Secondary Hypercoagulable State (ICD10:  D68.69) The patient is at significant risk for stroke/thromboembolism based upon her CHA2DS2-VASc Score of 6.  Continue Apixaban (Eliquis).   Chronic HFpEF Appears euvolemic today  HTN Stable on current regimen   Follow up in the AF clinic post DCCV.    Informed Consent   Shared Decision Making/Informed Consent The risks (stroke, cardiac arrhythmias rarely resulting in the need for a temporary or permanent pacemaker, skin irritation or burns and complications associated with conscious sedation including aspiration, arrhythmia, respiratory failure and death), benefits (restoration of normal sinus rhythm) and alternatives of a direct current cardioversion were explained in detail to Ms. Ludden and she agrees to proceed.       Jorja Loa PA-C [facility] [address] [place], [place] [address] [phone] 05/13/2023 2:06 PM

## 2023-05-14 NOTE — Progress Notes (Signed)
Left a voicemail for pt and instructed them to come at 0715 and to be NPO after 0000.  Instructed pt to have a ride home and someone to stay with them for 24 hours after the procedure.

## 2023-05-17 ENCOUNTER — Ambulatory Visit (HOSPITAL_BASED_OUTPATIENT_CLINIC_OR_DEPARTMENT_OTHER): Payer: Medicare Other | Admitting: Registered Nurse

## 2023-05-17 ENCOUNTER — Ambulatory Visit (HOSPITAL_COMMUNITY)
Admission: RE | Admit: 2023-05-17 | Discharge: 2023-05-17 | Disposition: A | Discharge status: Home / Self Care | Payer: Medicare Other | Attending: Internal Medicine | Admitting: Internal Medicine

## 2023-05-17 ENCOUNTER — Encounter (HOSPITAL_COMMUNITY)
Admission: RE | Disposition: A | Discharge status: Home / Self Care | Payer: Self-pay | Source: Home / Self Care | Attending: Internal Medicine

## 2023-05-17 ENCOUNTER — Ambulatory Visit (HOSPITAL_COMMUNITY): Payer: Medicare Other | Admitting: Registered Nurse

## 2023-05-17 ENCOUNTER — Other Ambulatory Visit: Payer: Self-pay

## 2023-05-17 DIAGNOSIS — I130 Hypertensive heart and chronic kidney disease with heart failure and stage 1 through stage 4 chronic kidney disease, or unspecified chronic kidney disease: Secondary | ICD-10-CM

## 2023-05-17 DIAGNOSIS — E119 Type 2 diabetes mellitus without complications: Secondary | ICD-10-CM | POA: Diagnosis not present

## 2023-05-17 DIAGNOSIS — I4892 Unspecified atrial flutter: Secondary | ICD-10-CM | POA: Diagnosis not present

## 2023-05-17 DIAGNOSIS — Z7901 Long term (current) use of anticoagulants: Secondary | ICD-10-CM | POA: Insufficient documentation

## 2023-05-17 DIAGNOSIS — I484 Atypical atrial flutter: Secondary | ICD-10-CM | POA: Diagnosis not present

## 2023-05-17 DIAGNOSIS — E785 Hyperlipidemia, unspecified: Secondary | ICD-10-CM | POA: Insufficient documentation

## 2023-05-17 DIAGNOSIS — Z79899 Other long term (current) drug therapy: Secondary | ICD-10-CM | POA: Insufficient documentation

## 2023-05-17 DIAGNOSIS — N183 Chronic kidney disease, stage 3 unspecified: Secondary | ICD-10-CM

## 2023-05-17 DIAGNOSIS — I110 Hypertensive heart disease with heart failure: Secondary | ICD-10-CM | POA: Insufficient documentation

## 2023-05-17 DIAGNOSIS — I5032 Chronic diastolic (congestive) heart failure: Secondary | ICD-10-CM | POA: Insufficient documentation

## 2023-05-17 DIAGNOSIS — I4819 Other persistent atrial fibrillation: Secondary | ICD-10-CM | POA: Diagnosis not present

## 2023-05-17 DIAGNOSIS — D6869 Other thrombophilia: Secondary | ICD-10-CM | POA: Diagnosis not present

## 2023-05-17 DIAGNOSIS — E1122 Type 2 diabetes mellitus with diabetic chronic kidney disease: Secondary | ICD-10-CM | POA: Diagnosis not present

## 2023-05-17 DIAGNOSIS — I4891 Unspecified atrial fibrillation: Secondary | ICD-10-CM | POA: Diagnosis not present

## 2023-05-17 HISTORY — PX: CARDIOVERSION: SHX1299

## 2023-05-17 SURGERY — CARDIOVERSION
Anesthesia: General

## 2023-05-17 MED ORDER — SODIUM CHLORIDE 0.9 % IV SOLN: INTRAVENOUS | Status: DC

## 2023-05-17 MED ORDER — PROPOFOL 10 MG/ML IV BOLUS
INTRAVENOUS | Status: DC | PRN
  Administered 2023-05-17: 45 mg via INTRAVENOUS

## 2023-05-17 MED ORDER — LIDOCAINE 2% (20 MG/ML) 5 ML SYRINGE
INTRAMUSCULAR | Status: DC | PRN
  Administered 2023-05-17: 40 mg via INTRAVENOUS

## 2023-05-17 SURGICAL SUPPLY — 1 items: ELECT DEFIB PAD ADLT CADENCE (PAD) ×2 IMPLANT

## 2023-05-17 NOTE — Anesthesia Preprocedure Evaluation (Signed)
Anesthesia Evaluation  Patient identified by MRN, date of birth, ID band Patient awake    Reviewed: Allergy & Precautions, NPO status , Patient's Chart, lab work & pertinent test results  Airway Mallampati: I  TM Distance: >3 FB Neck ROM: Full    Dental  (+) Teeth Intact, Dental Advisory Given   Pulmonary neg pulmonary ROS   breath sounds clear to auscultation       Cardiovascular hypertension, Pt. on medications +CHF  + dysrhythmias Atrial Fibrillation + Valvular Problems/Murmurs  Rhythm:Irregular Rate:Abnormal     Neuro/Psych    GI/Hepatic Neg liver ROS, hiatal hernia,GERD  ,,  Endo/Other  diabetes    Renal/GU Renal disease     Musculoskeletal  (+) Arthritis ,    Abdominal   Peds  Hematology negative hematology ROS (+)   Anesthesia Other Findings   Reproductive/Obstetrics                              Anesthesia Physical Anesthesia Plan  ASA: 3  Anesthesia Plan: General   Post-op Pain Management: Minimal or no pain anticipated   Induction: Intravenous  PONV Risk Score and Plan: 0  Airway Management Planned: Natural Airway and Nasal Cannula  Additional Equipment: None  Intra-op Plan:   Post-operative Plan:   Informed Consent: I have reviewed the patients History and Physical, chart, labs and discussed the procedure including the risks, benefits and alternatives for the proposed anesthesia with the patient or authorized representative who has indicated his/her understanding and acceptance.       Plan Discussed with: CRNA  Anesthesia Plan Comments:          Anesthesia Quick Evaluation

## 2023-05-17 NOTE — Transfer of Care (Signed)
Immediate Anesthesia Transfer of Care Note  Patient: Briana [name]  Procedure(s) Performed: CARDIOVERSION  Patient Location: Cath Lab  Anesthesia Type:General  Level of Consciousness: drowsy  Airway & Oxygen Therapy: Patient Spontanous Breathing and Patient connected to nasal cannula oxygen  Post-op Assessment: Report given to RN and Post -op Vital signs reviewed and stable  Post vital signs: Reviewed and stable  Last Vitals:  Vitals Value Taken Time  BP 138/70   Temp    Pulse 59   Resp 14   SpO2 94     Last Pain:  Vitals:   05/17/23 0859  TempSrc:   PainSc: 0-No pain         Complications: No notable events documented.

## 2023-05-17 NOTE — Discharge Instructions (Signed)

## 2023-05-17 NOTE — CV Procedure (Signed)
Electrical Cardioversion Procedure Note Briana [name] [id number] [birth year]  Procedure: Electrical Cardioversion Indications:  Atrial Flutter  Procedure Details Consent: Risks of procedure as well as the alternatives and risks of each were explained to the (patient/caregiver).  Consent for procedure obtained. Time Out: Verified patient identification, verified procedure, site/side was marked, verified correct patient position, special equipment/implants available, medications/allergies/relevent history reviewed, required imaging and test results available.  Performed  Patient placed on cardiac monitor, pulse oximetry, supplemental oxygen as necessary.  Sedation given:  propofol and lidocaine  Pacer pads placed anterior and posterior chest.  Cardioverted 1 time(s).  Cardioverted at 200J.  Evaluation Findings: Post procedure EKG shows: NSR Complications: None Patient did tolerate procedure well.   Briana [name] 05/17/2023, 1:24 PM

## 2023-05-17 NOTE — Anesthesia Postprocedure Evaluation (Signed)
Anesthesia Post Note  Patient: Briana [name]  Procedure(s) Performed: CARDIOVERSION     Patient location during evaluation: PACU Anesthesia Type: General Level of consciousness: awake and alert Pain management: pain level controlled Vital Signs Assessment: post-procedure vital signs reviewed and stable Respiratory status: spontaneous breathing, nonlabored ventilation, respiratory function stable and patient connected to nasal cannula oxygen Cardiovascular status: blood pressure returned to baseline and stable Postop Assessment: no apparent nausea or vomiting Anesthetic complications: no  No notable events documented.  Last Vitals:  Vitals:   05/17/23 0942 05/17/23 0953  BP: 114/70 129/65  Pulse: (!) 53 (!) 53  Resp: 17 18  Temp:    SpO2: 97% 98%    Last Pain:  Vitals:   05/17/23 0936  TempSrc: Temporal  PainSc: 0-No pain                 Shelton Silvas

## 2023-05-18 ENCOUNTER — Encounter (HOSPITAL_COMMUNITY): Payer: Self-pay | Admitting: Internal Medicine

## 2023-05-21 NOTE — Interval H&P Note (Signed)
History and Physical Interval Note:  05/21/2023 5:52 AM  Briana [name]  has presented today for surgery, with the diagnosis of AFIB.  The various methods of treatment have been discussed with the patient and family. After consideration of risks, benefits and other options for treatment, the patient has consented to  Procedure(s): CARDIOVERSION (N/A) as a surgical intervention.  The patient's history has been reviewed, patient examined, no change in status, stable for surgery.  I have reviewed the patient's chart and labs.  Questions were answered to the patient's satisfaction.     Dietrich Pates

## 2023-06-07 ENCOUNTER — Ambulatory Visit (HOSPITAL_COMMUNITY)
Admission: RE | Admit: 2023-06-07 | Discharge: 2023-06-07 | Disposition: A | Discharge status: Home / Self Care | Payer: Medicare Other | Source: Ambulatory Visit | Attending: Physician Assistant | Admitting: Physician Assistant

## 2023-06-07 ENCOUNTER — Encounter (HOSPITAL_COMMUNITY): Payer: Self-pay | Admitting: Physician Assistant

## 2023-06-07 VITALS — BP 118/72 | HR 50 | Ht 60.0 in | Wt 130.4 lb

## 2023-06-07 DIAGNOSIS — Z7901 Long term (current) use of anticoagulants: Secondary | ICD-10-CM | POA: Diagnosis not present

## 2023-06-07 DIAGNOSIS — I5032 Chronic diastolic (congestive) heart failure: Secondary | ICD-10-CM | POA: Insufficient documentation

## 2023-06-07 DIAGNOSIS — D6869 Other thrombophilia: Secondary | ICD-10-CM | POA: Diagnosis not present

## 2023-06-07 DIAGNOSIS — I4892 Unspecified atrial flutter: Secondary | ICD-10-CM | POA: Diagnosis not present

## 2023-06-07 DIAGNOSIS — Z79899 Other long term (current) drug therapy: Secondary | ICD-10-CM

## 2023-06-07 DIAGNOSIS — Z5181 Encounter for therapeutic drug level monitoring: Secondary | ICD-10-CM

## 2023-06-07 DIAGNOSIS — I110 Hypertensive heart disease with heart failure: Secondary | ICD-10-CM | POA: Diagnosis not present

## 2023-06-07 DIAGNOSIS — E785 Hyperlipidemia, unspecified: Secondary | ICD-10-CM | POA: Diagnosis not present

## 2023-06-07 DIAGNOSIS — I4819 Other persistent atrial fibrillation: Secondary | ICD-10-CM | POA: Diagnosis not present

## 2023-06-07 NOTE — Progress Notes (Signed)
Primary Care Physician: [name], MD Primary Cardiologist: [name] Primary Electrophysiologist: none Referring Physician: Dr Yates Decamp is a 89 y.o. female with a history of HTN, HLD, DM, chronic HFpEF, atrial fibrillation who presents for consultation in the [facility].  The patient was initially diagnosed with atrial fibrillation in 2020 and underwent DCCV x 2. She has been maintained on amiodarone. Patient is on Eliquis for a CHADS2VASC score of 6. She was seen by [name] on 03/19/23 and found to be in rate controlled atrial flutter. Her amiodarone was increased and she was referred to the AF clinic. Her Eliquis was changed to Xarelto since her Cr was vacillating around 1.5.    On follow up today, patient is s/p DCCV on 05/17/23. She remains in SR today. However, she is still having symptoms of fatigue with activity.   Today, she denies symptoms of palpitations, chest pain, shortness of breath, orthopnea, PND, lower extremity edema, dizziness, presyncope, syncope, snoring, daytime somnolence, bleeding, or neurologic sequela. The patient is tolerating medications without difficulties and is otherwise without complaint today.    Atrial Fibrillation Risk Factors:  she does not have symptoms or diagnosis of sleep apnea. she does not have a history of rheumatic fever. she does not have a history of alcohol use. The patient does not have a history of early familial atrial fibrillation or other arrhythmias.   Atrial Fibrillation Management history:  Previous antiarrhythmic drugs: amiodarone  Previous cardioversions: 2020 x 2, 05/17/23 Previous ablations: none Anticoagulation history: Eliquis, Xarelto    Past Medical History:  Diagnosis Date   Acute on chronic diastolic (congestive) heart failure (HCC) 11/21/2018   Cancer (HCC)    skin cancer   DDD (degenerative disc disease)    Diverticula of colon    Diverticulitis    DJD (degenerative  joint disease)    GERD (gastroesophageal reflux disease)    takes ranitidine   Heart murmur    History of hiatal hernia    History of pneumonia    Hx of irritable bowel syndrome    Hyperlipidemia    Hypertension    Occasional tremors    Osteoporosis    Pre-diabetes    Varicose veins    Vitamin D deficiency     ROS- All systems are reviewed and negative except as per the HPI above.  Physical Exam: Vitals:   06/07/23 1434  BP: 118/72  Pulse: (!) 50  Weight: 59.1 kg  Height: 5' (1.524 m)    GEN: Well nourished, well developed in no acute distress NECK: No JVD; No carotid bruits CARDIAC: Regular rate and rhythm, no murmurs, rubs, gallops RESPIRATORY:  Clear to auscultation without rales, wheezing or rhonchi  ABDOMEN: Soft, non-tender, non-distended EXTREMITIES:  No edema; No deformity    Wt Readings from Last 3 Encounters:  06/07/23 59.1 kg  05/17/23 59 kg  05/13/23 60.1 kg    EKG today demonstrates  SB, 1st degree AV block, inc RBBB Vent. rate 50 BPM PR interval 240 ms QRS duration 108 ms QT/QTcB 492/448 ms  Echo 04/08/23 demonstrated  1. Atrial flutter controlled rate. Left ventricular ejection fraction, by estimation, is 60 to 65%. The left ventricle has normal function. The left ventricle has no regional wall motion abnormalities. There is mild concentric left ventricular hypertrophy. Left ventricular diastolic  parameters are indeterminate.   2. Right ventricular systolic function is mildly reduced. The right  ventricular size is normal. There is normal  pulmonary artery systolic  pressure.   3. Left atrial size was severely dilated.   4. The mitral valve is normal in structure. Mild mitral valve  regurgitation. No evidence of mitral stenosis.   5. The aortic valve is tricuspid. There is mild thickening of the aortic  valve. Aortic valve regurgitation is mild. No aortic stenosis is present.   6. Aortic Normal DTA.   7. The inferior vena cava is normal in size  with greater than 50%  respiratory variability, suggesting right atrial pressure of 3 mmHg.   Comparison(s): EF 55%, mild LVH, mild-mod MR, RVSP 53.4 mmHg.   Epic records are reviewed at length today.  CHA2DS2-VASc Score = 6  The patient's score is based upon: CHF History: 1 HTN History: 1 Diabetes History: 1 Stroke History: 0 Vascular Disease History: 0 Age Score: 2 Gender Score: 1       ASSESSMENT AND PLAN: Persistent Atrial Fibrillation/atrial flutter The patient's CHA2DS2-VASc score is 6, indicating a 9.7% annual risk of stroke.   S/p DCCV 05/17/23 Patient in SR today. Continue Xarelto 15 mg daily Continue amiodarone 200 mg daily Continue diltiazem 180 mg daily, discussed decreasing to 120 mg daily given bradycardia and fatigue. She would like to continue her present medications for another week and call back if she is not feeling better.   Secondary Hypercoagulable State (ICD10:  D68.69) The patient is at significant risk for stroke/thromboembolism based upon her CHA2DS2-VASc Score of 6.  Continue Apixaban (Eliquis).   Chronic HFpEF Fluid status appears stable today  HTN Stable on current regimen   Follow up in the AF clinic in 3 months.     Jorja Loa PA-C [facility] [address] [place], [place] [address] [phone] 06/07/2023 2:51 PM

## 2023-06-10 ENCOUNTER — Encounter: Payer: Self-pay | Admitting: Internal Medicine

## 2023-06-12 ENCOUNTER — Other Ambulatory Visit: Payer: Self-pay | Admitting: Cardiovascular Disease

## 2023-06-12 NOTE — Progress Notes (Unsigned)
MEDICARE ANNUAL WELLNESS VISIT AND FOLLOW UP  Assessment:   Diagnoses and all orders for this visit: Briana [name] was seen today for medicare wellness.  Diagnoses and all orders for this visit:  Encounter for Medicare annual wellness exam Due annually  Chronic atrial fibrillation (HCC) Continue medication Follow with cardiology  Chronic diastolic CHF (congestive heart failure) (HCC) CHF Disease process and medications discussed. Questions answered fully. Emphasized salt restriction, less than 2000mg  a day. Encouraged daily monitoring of the patient's weight, call office if 5 lb weight loss or gain in a day.  Encouraged regular exercise. If any increasing shortness of breath, swelling, or chest pressure go to ER immediately.  decrease your fluid intake to less than 2 L daily please remember to always increase your potassium intake with any increase of your fluid pill.    Type 2 diabetes mellitus with stage 3a chronic kidney disease, without long-term current use of insulin (HCC) Currently diet controlled Continue diet and exercise.  Perform daily foot/skin check, notify office of any concerning changes.  Check A1C  -     CBC with Differential/Platelet -     COMPLETE METABOLIC PANEL WITH GFR -     Hemoglobin A1c  CKD stage 3 due to type 2 diabetes mellitus (HCC) Increase fluids, avoid NSAIDS, monitor sugars, will monitor  -     CBC with Differential/Platelet -     COMPLETE METABOLIC PANEL WITH GFR  Type 2 diabetes mellitus with hyperlipidemia (HCC) Continue diet and exercise  Acquired thrombophilia (HCC) Continue to monitor for bleeding/bruising  Aortic atherosclerosis (HCC) by Chest CTA on 11/21/2018.  Continue to monitor blood pressure, bloods sugar, weight and cholesterol  Hyperlipidemia associated with type 2 diabetes mellitus (HCC) Continue diet and exercise -     Lipid panel  Essential hypertension - continue medications, DASH diet, exercise and monitor at home.  Call if greater than 130/80.   Spinal stenosis of lumbar region without neurogenic claudication Continue Tylenol for pain as needed Monitor  Osteopenia of right forearm DEXA 03/03/21 Continue Vit D supplementation and weight bearing exercises  Gastroesophageal reflux disease with esophagitis, unspecified whether hemorrhage Continue dietary modifications  Vitamin D deficiency Continue Vit D supplementation to maintain value in therapeutic level of 60-100   Medication management Continued    Over 40 minutes of exam, counseling, chart review and critical decision making was performed Future Appointments  Date Time Provider Department Center  06/14/2023 10:30 AM Raynelle Dick, NP [facility] None  09/08/2023  3:00 PM Alphonzo Severance R, PA [facility] None  09/14/2023 11:30 AM [name], MD [facility] None  12/15/2023 11:00 AM Raynelle Dick, NP [facility] None  03/14/2024  3:00 PM [name], MD [facility] None  06/13/2024 11:30 AM Raynelle Dick, NP [facility] None     Plan:   During the course of the visit the patient was educated and counseled about appropriate screening and preventive services including:   Pneumococcal vaccine  Prevnar 13 Influenza vaccine Td vaccine Screening electrocardiogram Bone densitometry screening Colorectal cancer screening Diabetes screening Glaucoma screening Nutrition counseling  Advanced directives: requested   Subjective:  Briana [name] is a 89 y.o. female who presents for Medicare Annual Wellness Visit and 3 month follow up.   She had a fall several months walking into shed, fell down 1 step onto her right side.  Developed a large hematoma that is very slowly healing.  No other injuries.  Denies current pain.  Still a lump present but no ecchymosis.  Patient's GERD is controlled without medication  Patient is also followed by Dr Ethelene Hal with [facility] for Lumbar spinal stenosis and had L3/L5 SS decompression surg in July  2016 by Dr Orlan Leavens. No further back pain.   She also has hereditary or essential tremors. Takes Valium for tremors as needed.   She has atrial fibrillation, cardioversion 12/2018 successful and now on amiodarone and elequis, cardizem 180 mg . Followed by Dr. Tresa Endo. She does have occasionally palpitations.   BMI is There is no height or weight on file to calculate BMI., she has been working on diet (avoiding fried foods) and exercise. She is trying to eat regularly. She is trying to drink more water.  Wt Readings from Last 3 Encounters:  06/07/23 130 lb 6.4 oz (59.1 kg)  05/17/23 130 lb (59 kg)  05/13/23 132 lb 9.6 oz (60.1 kg)   Her blood pressure has been controlled at home, today their BP is   At home her BP has been running 130/66 BP Readings from Last 3 Encounters:  06/07/23 118/72  05/17/23 129/65  05/13/23 (!) 142/82    She does workout. She denies chest pain, shortness of breath, dizziness.    She is not on cholesterol medication and denies myalgias. Her cholesterol is not at goal. The cholesterol last visit was:   Lab Results  Component Value Date   CHOL 186 03/11/2023   HDL 61 03/11/2023   LDLCALC 106 (H) 03/11/2023   TRIG 94 03/11/2023   CHOLHDL 3.0 03/11/2023    She has been working on diet and exercise for Type 2 diabetes, and denies foot ulcerations, increased appetite, nausea, paresthesia of the feet, polydipsia, polyuria, visual disturbances, vomiting and weight loss. Last A1C in the office was:  Lab Results  Component Value Date   HGBA1C 7.9 (H) 03/11/2023   Last GFR: Lab Results  Component Value Date   EGFR 29 (L) 03/11/2023    Patient is on Vitamin D supplement and at goal at recent check:    Lab Results  Component Value Date   VD25OH 49 03/11/2023       Medication Review: Current Outpatient Medications on File Prior to Visit  Medication Sig Dispense Refill   acetaminophen (TYLENOL) 500 MG tablet Take 500 mg by mouth every 6 (six) hours as needed for  moderate pain.     albuterol (PROAIR HFA) 108 (90 Base) MCG/ACT inhaler Take  2 inhalations  15 minutes apart  every 4 hours  as needed for Wheezing 48 g 3   amiodarone (PACERONE) 200 MG tablet Take 1 tablet by mouth once daily (Patient taking differently: Take 200 mg by mouth in the morning.) 90 tablet 0   Ascorbic Acid (VITAMIN C PO) Take 1 tablet by mouth at bedtime.     Boswellia-Glucosamine-Vit D (OSTEO BI-FLEX ONE PER DAY PO) Take 1 tablet by mouth in the morning.     Carboxymeth-Glycerin-Polysorb (REFRESH DIGITAL OP) Place 1 drop into the right eye daily as needed (dry eyes).     cholecalciferol (VITAMIN D3) 25 MCG (1000 UNIT) tablet Take 3,000 Units by mouth daily.     Cyanocobalamin (B-12 PO) Take 1 capsule by mouth daily.     diazepam (VALIUM) 2 MG tablet Take 1 tablet 3 - 4 x /day for Tremor                                     /  TAKE                    BY                     MOUTH (Patient taking differently: Take 2 mg by mouth as needed (Tremors).) 120 tablet 1   diltiazem (CARDIZEM CD) 180 MG 24 hr capsule Take 1 capsule  Daily for Heart & BP                                                                                                   /                          TAKE                                      BY                                            MOUTH                                                   ONCE ? DAILY (Patient taking differently: Take 180 mg by mouth in the morning.) 90 capsule 3   Ferrous Sulfate (SLOW RELEASE IRON PO) Take 1 tablet by mouth at bedtime.     furosemide (LASIX) 80 MG tablet Take 80 mg by mouth daily.     loratadine (CLARITIN) 10 MG tablet Take 10 mg by mouth daily as needed for allergies.     Multiple Vitamins-Minerals (ZINC PO) Take 1 capsule by mouth daily. (Patient not taking: Reported on 06/07/2023)     polyethylene glycol (MIRALAX / GLYCOLAX) 17 g packet Take 17 g by mouth daily. 14 each 0   Pyridoxine HCl (B-6 PO) Take  1 tablet by mouth daily.     Rivaroxaban (XARELTO) 15 MG TABS tablet Take 1 tablet (15 mg total) by mouth daily with supper. 30 tablet 6   Simethicone (GAS-X PO) Take 1 tablet by mouth daily as needed (gas).     No current facility-administered medications on file prior to visit.    Allergies  Allergen Reactions   Accupril [Quinapril Hcl] Cough   Prednisone     Agitated and shaky   Reglan [Metoclopramide] Other (See Comments)    tremors   Tramadol Nausea And Vomiting   Adhesive [Tape] Rash   Neosporin [Neomycin-Bacitracin Zn-Polymyx] Itching and Rash    Current Problems (verified) Patient Active Problem List   Diagnosis Date Noted   Persistent atrial fibrillation (HCC) 06/07/2023   Atypical atrial flutter (HCC) 04/15/2023   Diverticular hemorrhage    Hyperparathyroidism due to renal insufficiency (HCC) (Elev PTH  140 on 03/05/2021  03/06/2021   Aortic atherosclerosis (HCC) by Chest CTA on 11/21/2018.  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 07/01/2020   Hypercoagulable state due to persistent atrial fibrillation (HCC) 11/15/2019   Chronic diastolic heart failure (HCC) 04/03/2019   CKD stage 3 due to type 2 diabetes mellitus (HCC) 02/23/2018   Bilateral sensorineural hearing loss 07/02/2017   Obesity (BMI 30.0-34.9) 09/05/2015   Lumbar stenosis 06/05/2015   GERD 11/20/2013   Osteopenia 11/20/2013   Essential hypertension 11/19/2013   Type 2 diabetes mellitus with hyperlipidemia (HCC) 11/19/2013   Vitamin D deficiency 11/19/2013   Diffuse cystic mastopathy 11/19/2013    Screening Tests Immunization History  Administered Date(s) Administered   DT (Pediatric) 09/05/2015   DTaP 11/16/2004   Influenza Whole 08/30/2013   Influenza, High Dose Seasonal PF 08/28/2015, 08/10/2016, 11/20/2019, 09/10/2020   Pneumococcal Conjugate-13 10/19/2016   Pneumococcal Polysaccharide-23 11/16/2001   Zoster, Live 06/01/2013   Preventative care: Last  colonoscopy: 2011, Flex Sig 02/2021 Mammogram: 03/03/21 negative repeat 1 year DEXA: 02/2021- T - 2.2  femur  Prior vaccinations: TD or Tdap: 2016  Influenza: 2017, declines   Pneumococcal: 2003 Prevnar13: 2017 Shingles/Zostavax: 2014  Names of Other Physician/Practitioners you currently use: 1. [facility] here for primary care 2. Dr. Dione Booze, eye doctor, last visit 2022, has scheduled 06/2022 3. Dr. Mackie Pai, dentist, last visit 2022  Patient Care Team: [name], MD as PCP - General (Internal Medicine) Lennette Bihari, MD as PCP - Cardiology (Cardiology) Bradly Bienenstock, MD as Consulting Physician (Orthopedic Surgery) Meryl Dare, MD as Consulting Physician (Gastroenterology) Sheran Luz, MD as Consulting Physician (Physical Medicine and Rehabilitation) Venancio Poisson, MD as Consulting Physician (Dermatology) [name], MD as Consulting Physician (Cardiology) Lajoyce Corners, [facility] (Inactive) as Pharmacist (Pharmacist)  SURGICAL HISTORY She  has a past surgical history that includes Thyroid surgery; varicose veins stripped; Tonsillectomy; Appendectomy; Tubal ligation; Open reduction internal fixation (orif) distal radial fracture (Right, 01/10/2014); Colonoscopy; Esophagogastroduodenoscopy; Breast surgery (Right); Joint replacement (Left); Spinal cord decompression (06/05/2015); Lumbar laminectomy/decompression microdiscectomy (N/A, 06/05/2015); Cardioversion (N/A, 12/20/2018); Cardioversion (N/A, 06/16/2019); Breast excisional biopsy (Right); Flexible sigmoidoscopy (N/A, 03/09/2021); Hemostasis clip placement (03/09/2021); Wrist fracture surgery (Bilateral); and Cardioversion (N/A, 05/17/2023). FAMILY HISTORY Her family history includes Cancer in her brother; Heart disease in her sister; Hyperlipidemia in her mother; Hypertension in her mother. SOCIAL HISTORY She  reports that she has never smoked. She has never used smokeless  tobacco. She reports that she does not drink alcohol and does not use drugs.   MEDICARE WELLNESS OBJECTIVES: Physical activity:   Cardiac risk factors:   Depression/mood screen:      03/13/2023   10:08 PM  Depression screen PHQ 2/9  Decreased Interest 0  Down, Depressed, Hopeless 0  PHQ - 2 Score 0    ADLs:     05/17/2023    8:55 AM 03/13/2023   10:08 PM  In your present state of health, do you have any difficulty performing the following activities:  Hearing? 1 0  Comment hearing aids are at home   Vision? 0 0  Difficulty concentrating or making decisions? 0 0  Walking or climbing stairs? 1 0  Dressing or bathing? 0 0  Doing errands, shopping?  0     Cognitive Testing  Alert? Yes  Normal Appearance?Yes  Oriented to person? Yes  Place? Yes   Time? Yes  Recall of three objects?  Yes  Can perform  simple calculations? Yes  Displays appropriate judgment?Yes  Can read the correct time from a watch face?Yes  EOL planning:    Review of Systems  Constitutional:  Negative for chills, fever, malaise/fatigue and weight loss.  HENT:  Negative for congestion, hearing loss, sore throat and tinnitus.   Eyes:  Negative for blurred vision and double vision.  Respiratory:  Negative for cough, sputum production, shortness of breath and wheezing.   Cardiovascular:  Negative for chest pain, palpitations, orthopnea, claudication, leg swelling and PND.  Gastrointestinal:  Negative for abdominal pain, blood in stool, constipation, diarrhea, heartburn, melena, nausea and vomiting.  Genitourinary: Negative.  Negative for dysuria.  Musculoskeletal:  Negative for falls, joint pain and myalgias.  Skin:  Negative for rash.       Had fall several months ago and large hematoma right hip- still 3-4 cm lump but no ecchymosis  Neurological:  Negative for dizziness, tingling, sensory change, weakness and headaches.  Endo/Heme/Allergies:  Negative for polydipsia.  Psychiatric/Behavioral: Negative.   Negative for depression, memory loss, substance abuse and suicidal ideas. The patient is not nervous/anxious and does not have insomnia.   All other systems reviewed and are negative.    Objective:     There were no vitals filed for this visit.  There is no height or weight on file to calculate BMI.  General appearance: alert, no distress, WD/WN, female HEENT: normocephalic, sclerae anicteric, TMs pearly, nares patent, no discharge or erythema, pharynx normal Oral cavity: MMM, no lesions Neck: supple, no lymphadenopathy, no thyromegaly, no masses Heart: Regular, normal S1, S2, no murmurs Lungs: CTA bilaterally, no wheezes, rhonchi, or rales Abdomen: +bs, soft, non tender, non distended, no masses, no hepatomegaly, no splenomegaly Musculoskeletal: nontender, no swelling, no obvious deformity, neg straight leg raise . 3-4 cm firm moveable lump right hip- no discoloration Extremities: no edema, no cyanosis, no clubbing Pulses: 2+ symmetric, upper and lower extremities, normal cap refill Neurological: alert, oriented x 3, CN2-12 intact, strength normal upper extremities and lower extremities, sensation normal throughout, DTRs 2+ throughout, no cerebellar signs, gait normal Psychiatric: normal affect, behavior normal, pleasant   Medicare Attestation I have personally reviewed: The patient's medical and social history Their use of alcohol, tobacco or illicit drugs Their current medications and supplements The patient's functional ability including ADLs,fall risks, home safety risks, cognitive, and hearing and visual impairment Diet and physical activities Evidence for depression or mood disorders  The patient's weight, height, BMI, and visual acuity have been recorded in the chart.  I have made referrals, counseling, and provided education to the patient based on review of the above and I have provided the patient with a written personalized care plan for preventive services.     Raynelle Dick, NP   06/12/2023

## 2023-06-14 ENCOUNTER — Encounter: Payer: Self-pay | Admitting: Nurse Practitioner

## 2023-06-14 ENCOUNTER — Ambulatory Visit (INDEPENDENT_AMBULATORY_CARE_PROVIDER_SITE_OTHER): Payer: Medicare Other | Admitting: Nurse Practitioner

## 2023-06-14 ENCOUNTER — Ambulatory Visit: Payer: Medicare Other | Admitting: Nurse Practitioner

## 2023-06-14 VITALS — BP 140/72 | HR 57 | Temp 97.7°F | Ht 60.0 in | Wt 130.8 lb

## 2023-06-14 DIAGNOSIS — I700 Atherosclerosis of aorta: Secondary | ICD-10-CM | POA: Diagnosis not present

## 2023-06-14 DIAGNOSIS — E559 Vitamin D deficiency, unspecified: Secondary | ICD-10-CM | POA: Diagnosis not present

## 2023-06-14 DIAGNOSIS — R300 Dysuria: Secondary | ICD-10-CM

## 2023-06-14 DIAGNOSIS — M85831 Other specified disorders of bone density and structure, right forearm: Secondary | ICD-10-CM | POA: Diagnosis not present

## 2023-06-14 DIAGNOSIS — M1711 Unilateral primary osteoarthritis, right knee: Secondary | ICD-10-CM | POA: Diagnosis not present

## 2023-06-14 DIAGNOSIS — I482 Chronic atrial fibrillation, unspecified: Secondary | ICD-10-CM

## 2023-06-14 DIAGNOSIS — Z79899 Other long term (current) drug therapy: Secondary | ICD-10-CM | POA: Diagnosis not present

## 2023-06-14 DIAGNOSIS — E1122 Type 2 diabetes mellitus with diabetic chronic kidney disease: Secondary | ICD-10-CM

## 2023-06-14 DIAGNOSIS — N183 Chronic kidney disease, stage 3 unspecified: Secondary | ICD-10-CM

## 2023-06-14 DIAGNOSIS — Z0001 Encounter for general adult medical examination with abnormal findings: Secondary | ICD-10-CM | POA: Diagnosis not present

## 2023-06-14 DIAGNOSIS — K2100 Gastro-esophageal reflux disease with esophagitis, without bleeding: Secondary | ICD-10-CM

## 2023-06-14 DIAGNOSIS — Z0000 Encounter for general adult medical examination without abnormal findings: Secondary | ICD-10-CM

## 2023-06-14 DIAGNOSIS — I10 Essential (primary) hypertension: Secondary | ICD-10-CM

## 2023-06-14 DIAGNOSIS — M1712 Unilateral primary osteoarthritis, left knee: Secondary | ICD-10-CM | POA: Diagnosis not present

## 2023-06-14 DIAGNOSIS — D6869 Other thrombophilia: Secondary | ICD-10-CM | POA: Diagnosis not present

## 2023-06-14 DIAGNOSIS — R6889 Other general symptoms and signs: Secondary | ICD-10-CM

## 2023-06-14 DIAGNOSIS — E785 Hyperlipidemia, unspecified: Secondary | ICD-10-CM | POA: Diagnosis not present

## 2023-06-14 DIAGNOSIS — E1169 Type 2 diabetes mellitus with other specified complication: Secondary | ICD-10-CM

## 2023-06-14 DIAGNOSIS — M48061 Spinal stenosis, lumbar region without neurogenic claudication: Secondary | ICD-10-CM

## 2023-06-14 DIAGNOSIS — I5032 Chronic diastolic (congestive) heart failure: Secondary | ICD-10-CM

## 2023-06-14 DIAGNOSIS — Z96642 Presence of left artificial hip joint: Secondary | ICD-10-CM | POA: Diagnosis not present

## 2023-06-14 DIAGNOSIS — M25562 Pain in left knee: Secondary | ICD-10-CM | POA: Diagnosis not present

## 2023-06-14 LAB — CBC WITH DIFFERENTIAL/PLATELET
Absolute Monocytes: 588 cells/uL (ref 200–950)
Basophils Absolute: 30 cells/uL (ref 0–200)
Basophils Relative: 0.5 %
Eosinophils Absolute: 198 cells/uL (ref 15–500)
Eosinophils Relative: 3.3 %
HCT: 38.6 % (ref 35.0–45.0)
Hemoglobin: 12.7 g/dL (ref 11.7–15.5)
Lymphs Abs: 756 cells/uL — ABNORMAL LOW (ref 850–3900)
MCH: 31 pg (ref 27.0–33.0)
MCHC: 32.9 g/dL (ref 32.0–36.0)
MCV: 94.1 fL (ref 80.0–100.0)
MPV: 11.2 fL (ref 7.5–12.5)
Monocytes Relative: 9.8 %
Neutro Abs: 4428 cells/uL (ref 1500–7800)
Neutrophils Relative %: 73.8 %
Platelets: 225 10*3/uL (ref 140–400)
RBC: 4.1 10*6/uL (ref 3.80–5.10)
RDW: 13.1 % (ref 11.0–15.0)
Total Lymphocyte: 12.6 %
WBC: 6 10*3/uL (ref 3.8–10.8)

## 2023-06-14 MED ORDER — FUROSEMIDE 40 MG PO TABS
40.0000 mg | ORAL_TABLET | Freq: Every day | ORAL | 3 refills | Status: DC
Start: 2023-06-14 — End: 2024-06-20

## 2023-06-14 NOTE — Patient Instructions (Signed)
Decrease Furosemide to 40 mg daily     GENERAL HEALTH GOALS   Know what a healthy weight is for you (roughly BMI <25) and aim to maintain this   Aim for 7+ servings of fruits and vegetables daily   70-80+ fluid ounces of water or unsweet tea for healthy kidneys   Limit to max 1 drink of alcohol per day; avoid smoking/tobacco   Limit animal fats in diet for cholesterol and heart health - choose grass fed whenever available   Avoid highly processed foods, and foods high in saturated/trans fats   Aim for low stress - take time to unwind and care for your mental health   Aim for 150 min of moderate intensity exercise weekly for heart health, and weights twice weekly for bone health   Aim for 7-9 hours of sleep daily

## 2023-07-06 ENCOUNTER — Other Ambulatory Visit: Payer: Self-pay | Admitting: Cardiovascular Disease

## 2023-07-08 ENCOUNTER — Telehealth: Payer: Self-pay | Admitting: Cardiovascular Disease

## 2023-07-08 NOTE — Telephone Encounter (Signed)
Pt c/o medication issue:  1. Name of Medication: amiodarone (PACERONE) 200 MG tablet   2. How are you currently taking this medication (dosage and times per day)? Take 1 tablet by mouth once daily   3. Are you having a reaction (difficulty breathing--STAT)? No  4. What is your medication issue? Patient's daughter is calling because she was trying to get a refill for the patient. The pharmacy stated it would be too early for her to pick the medication up. The pharmacy stated she needs to wait till Sept 6th, but the patient only has enough to last till 08/29. Patient's daughter would like to know if there is anything we can do to help.

## 2023-07-08 NOTE — Telephone Encounter (Signed)
Spoke with daughter per DPR and she states her mom will be out of amiodarone and insurance is saying its too soon to order. I asked how has she been taking amiodarone and she stated 200 mg 1 tab daily but the PA from AFIB clinic did have her take a pill in half for a couple of days.   She is aware being that her dose/mg has not changed and she is still currently taking 200 mg she does not need new Rx sent in. She will also reach out to AFIB clinic.

## 2023-07-29 DIAGNOSIS — M1712 Unilateral primary osteoarthritis, left knee: Secondary | ICD-10-CM | POA: Diagnosis not present

## 2023-08-05 DIAGNOSIS — M1712 Unilateral primary osteoarthritis, left knee: Secondary | ICD-10-CM | POA: Diagnosis not present

## 2023-08-12 DIAGNOSIS — M1712 Unilateral primary osteoarthritis, left knee: Secondary | ICD-10-CM | POA: Diagnosis not present

## 2023-08-13 DIAGNOSIS — H43813 Vitreous degeneration, bilateral: Secondary | ICD-10-CM | POA: Diagnosis not present

## 2023-08-13 DIAGNOSIS — Z961 Presence of intraocular lens: Secondary | ICD-10-CM | POA: Diagnosis not present

## 2023-08-13 DIAGNOSIS — H04123 Dry eye syndrome of bilateral lacrimal glands: Secondary | ICD-10-CM | POA: Diagnosis not present

## 2023-08-13 DIAGNOSIS — H401122 Primary open-angle glaucoma, left eye, moderate stage: Secondary | ICD-10-CM | POA: Diagnosis not present

## 2023-08-13 DIAGNOSIS — H40021 Open angle with borderline findings, high risk, right eye: Secondary | ICD-10-CM | POA: Diagnosis not present

## 2023-08-13 DIAGNOSIS — E119 Type 2 diabetes mellitus without complications: Secondary | ICD-10-CM | POA: Diagnosis not present

## 2023-08-13 DIAGNOSIS — H0102A Squamous blepharitis right eye, upper and lower eyelids: Secondary | ICD-10-CM | POA: Diagnosis not present

## 2023-08-13 DIAGNOSIS — H0102B Squamous blepharitis left eye, upper and lower eyelids: Secondary | ICD-10-CM | POA: Diagnosis not present

## 2023-08-13 DIAGNOSIS — H353131 Nonexudative age-related macular degeneration, bilateral, early dry stage: Secondary | ICD-10-CM | POA: Diagnosis not present

## 2023-09-08 ENCOUNTER — Ambulatory Visit (HOSPITAL_COMMUNITY): Payer: Medicare Other | Admitting: Physician Assistant

## 2023-09-13 ENCOUNTER — Encounter (HOSPITAL_COMMUNITY): Payer: Self-pay | Admitting: Physician Assistant

## 2023-09-13 ENCOUNTER — Ambulatory Visit (HOSPITAL_COMMUNITY)
Admission: RE | Admit: 2023-09-13 | Discharge: 2023-09-13 | Disposition: A | Discharge status: Home / Self Care | Payer: Medicare Other | Source: Ambulatory Visit | Attending: Physician Assistant | Admitting: Physician Assistant

## 2023-09-13 VITALS — BP 152/70 | HR 53 | Ht 60.0 in | Wt 128.4 lb

## 2023-09-13 DIAGNOSIS — Z79899 Other long term (current) drug therapy: Secondary | ICD-10-CM | POA: Diagnosis not present

## 2023-09-13 DIAGNOSIS — E785 Hyperlipidemia, unspecified: Secondary | ICD-10-CM | POA: Diagnosis not present

## 2023-09-13 DIAGNOSIS — Z5181 Encounter for therapeutic drug level monitoring: Secondary | ICD-10-CM | POA: Diagnosis not present

## 2023-09-13 DIAGNOSIS — I4892 Unspecified atrial flutter: Secondary | ICD-10-CM | POA: Insufficient documentation

## 2023-09-13 DIAGNOSIS — I5032 Chronic diastolic (congestive) heart failure: Secondary | ICD-10-CM | POA: Insufficient documentation

## 2023-09-13 DIAGNOSIS — Z7901 Long term (current) use of anticoagulants: Secondary | ICD-10-CM | POA: Diagnosis not present

## 2023-09-13 DIAGNOSIS — I440 Atrioventricular block, first degree: Secondary | ICD-10-CM | POA: Diagnosis not present

## 2023-09-13 DIAGNOSIS — I110 Hypertensive heart disease with heart failure: Secondary | ICD-10-CM | POA: Insufficient documentation

## 2023-09-13 DIAGNOSIS — Z8639 Personal history of other endocrine, nutritional and metabolic disease: Secondary | ICD-10-CM | POA: Diagnosis not present

## 2023-09-13 DIAGNOSIS — I4819 Other persistent atrial fibrillation: Secondary | ICD-10-CM | POA: Insufficient documentation

## 2023-09-13 DIAGNOSIS — R001 Bradycardia, unspecified: Secondary | ICD-10-CM | POA: Diagnosis not present

## 2023-09-13 DIAGNOSIS — I4510 Unspecified right bundle-branch block: Secondary | ICD-10-CM | POA: Diagnosis not present

## 2023-09-13 DIAGNOSIS — D6869 Other thrombophilia: Secondary | ICD-10-CM | POA: Diagnosis not present

## 2023-09-13 NOTE — Progress Notes (Signed)
Primary Care Physician: [name], MD Primary Cardiologist: [name] Primary Electrophysiologist: none Referring Physician: Dr Yates Decamp is a 89 y.o. female with a history of HTN, HLD, DM, chronic HFpEF, atrial fibrillation who presents for follow up in the [facility].  The patient was initially diagnosed with atrial fibrillation in 2020 and underwent DCCV x 2. She has been maintained on amiodarone. Patient is on Eliquis for a CHADS2VASC score of 6. She was seen by [name] on 03/19/23 and found to be in rate controlled atrial flutter. Her amiodarone was increased and she was referred to the AF clinic. Her Eliquis was changed to Xarelto since her Cr was vacillating around 1.5.    On follow up today, patient reports that she has done well since her last visit. She has not had any interim symptoms of afib. No bleeding issues on anticoagulation.   Today, she denies symptoms of palpitations, chest pain, shortness of breath, orthopnea, PND, lower extremity edema, dizziness, presyncope, syncope, snoring, daytime somnolence, bleeding, or neurologic sequela. The patient is tolerating medications without difficulties and is otherwise without complaint today.    Atrial Fibrillation Risk Factors:  she does not have symptoms or diagnosis of sleep apnea. she does not have a history of rheumatic fever. she does not have a history of alcohol use. The patient does not have a history of early familial atrial fibrillation or other arrhythmias.   Atrial Fibrillation Management history:  Previous antiarrhythmic drugs: amiodarone  Previous cardioversions: 2020 x 2, 05/17/23 Previous ablations: none Anticoagulation history: Eliquis, Xarelto    Past Medical History:  Diagnosis Date   Acute on chronic diastolic (congestive) heart failure (HCC) 11/21/2018   Cancer (HCC)    skin cancer   DDD (degenerative disc disease)    Diverticula of colon    Diverticulitis     DJD (degenerative joint disease)    GERD (gastroesophageal reflux disease)    takes ranitidine   Heart murmur    History of hiatal hernia    History of pneumonia    Hx of irritable bowel syndrome    Hyperlipidemia    Hypertension    Occasional tremors    Osteoporosis    Pre-diabetes    Varicose veins    Vitamin D deficiency     ROS- All systems are reviewed and negative except as per the HPI above.  Physical Exam: Vitals:   09/13/23 1125  BP: (!) 152/70  Pulse: (!) 53  Weight: 58.2 kg  Height: 5' (1.524 m)     GEN: Well nourished, well developed in no acute distress NECK: No JVD; No carotid bruits CARDIAC: Regular rate and rhythm, no murmurs, rubs, gallops RESPIRATORY:  Clear to auscultation without rales, wheezing or rhonchi  ABDOMEN: Soft, non-tender, non-distended EXTREMITIES:  No edema; No deformity    Wt Readings from Last 3 Encounters:  09/13/23 58.2 kg  06/14/23 59.3 kg  06/07/23 59.1 kg    EKG today demonstrates  SB, 1st degree AV block, inc RBBB Vent. rate 53 BPM PR interval 220 ms QRS duration 114 ms QT/QTcB 490/459 ms  Echo 04/08/23 demonstrated  1. Atrial flutter controlled rate. Left ventricular ejection fraction, by estimation, is 60 to 65%. The left ventricle has normal function. The left ventricle has no regional wall motion abnormalities. There is mild concentric left ventricular hypertrophy. Left ventricular diastolic  parameters are indeterminate.   2. Right ventricular systolic function is mildly reduced. The right  ventricular size is normal. There is normal pulmonary artery systolic  pressure.   3. Left atrial size was severely dilated.   4. The mitral valve is normal in structure. Mild mitral valve  regurgitation. No evidence of mitral stenosis.   5. The aortic valve is tricuspid. There is mild thickening of the aortic  valve. Aortic valve regurgitation is mild. No aortic stenosis is present.   6. Aortic Normal DTA.   7. The  inferior vena cava is normal in size with greater than 50%  respiratory variability, suggesting right atrial pressure of 3 mmHg.   Comparison(s): EF 55%, mild LVH, mild-mod MR, RVSP 53.4 mmHg.   Epic records are reviewed at length today.  CHA2DS2-VASc Score = 6  The patient's score is based upon: CHF History: 1 HTN History: 1 Diabetes History: 1 Stroke History: 0 Vascular Disease History: 0 Age Score: 2 Gender Score: 1       ASSESSMENT AND PLAN: Persistent Atrial Fibrillation/atrial flutter The patient's CHA2DS2-VASc score is 6, indicating a 9.7% annual risk of stroke.   Patient appears to be maintaining SR Continue Xarelto 15 mg daily Continue amiodarone 200 mg daily Continue diltiazem 180 mg daily Will need cmet/TSH at follow up  Secondary Hypercoagulable State (ICD10:  D68.69) The patient is at significant risk for stroke/thromboembolism based upon her CHA2DS2-VASc Score of 6.  Continue Apixaban (Eliquis).   Chronic HFpEF Fluid status appears stable  HTN Mildly elevated today, better controlled at previous visits. No changes for now, continue to monitor.    Follow up with [name] in 6 months. AF clinic in one year.     Jorja Loa PA-C [facility] [address] [place], [place] [address] [phone] 09/13/2023 11:53 AM

## 2023-09-14 ENCOUNTER — Ambulatory Visit (INDEPENDENT_AMBULATORY_CARE_PROVIDER_SITE_OTHER): Payer: Medicare Other | Admitting: Internal Medicine

## 2023-09-14 ENCOUNTER — Encounter: Payer: Self-pay | Admitting: Internal Medicine

## 2023-09-14 VITALS — BP 134/76 | HR 67 | Temp 97.9°F | Resp 16 | Ht 60.0 in | Wt 127.0 lb

## 2023-09-14 DIAGNOSIS — I10 Essential (primary) hypertension: Secondary | ICD-10-CM

## 2023-09-14 DIAGNOSIS — I700 Atherosclerosis of aorta: Secondary | ICD-10-CM | POA: Diagnosis not present

## 2023-09-14 DIAGNOSIS — I482 Chronic atrial fibrillation, unspecified: Secondary | ICD-10-CM | POA: Diagnosis not present

## 2023-09-14 DIAGNOSIS — E785 Hyperlipidemia, unspecified: Secondary | ICD-10-CM | POA: Diagnosis not present

## 2023-09-14 DIAGNOSIS — N1832 Chronic kidney disease, stage 3b: Secondary | ICD-10-CM

## 2023-09-14 DIAGNOSIS — E1122 Type 2 diabetes mellitus with diabetic chronic kidney disease: Secondary | ICD-10-CM

## 2023-09-14 DIAGNOSIS — E559 Vitamin D deficiency, unspecified: Secondary | ICD-10-CM

## 2023-09-14 DIAGNOSIS — Z79899 Other long term (current) drug therapy: Secondary | ICD-10-CM | POA: Diagnosis not present

## 2023-09-14 DIAGNOSIS — E1169 Type 2 diabetes mellitus with other specified complication: Secondary | ICD-10-CM | POA: Diagnosis not present

## 2023-09-14 NOTE — Patient Instructions (Signed)

## 2023-09-14 NOTE — Progress Notes (Signed)
Future Appointments  Date Time Provider Department  09/14/2023                         6 mo ov 11:30 AM [name], MD [facility]  12/15/2023                           wellness 11:00 AM Raynelle Dick, NP [facility]  03/14/2024                           cpe  3:00 PM [name], MD [facility]  06/13/2024 11:30 AM Raynelle Dick, NP [facility]        This very nice 89 y.o.  WWF  with  HTN, ASHD/cAfib, HLD, T2_NIDDM  and Vitamin D Deficiency,  who presents for follow-up.   CTA Chest in Jan 2020 showed Aortic Atherosclerosis. Patient was hospitalized in Apr 2022 due GI bleed due to severe Sigmoid /Desc. colon Diverticular Dz  (on Eliquis).          HTN predates circa  1996.  In Jan 2020, patient was dx'd with cAfib & Diastolic Ht failure & failed CV x 2.  In Mar 2022, she was hospitalized with CHF & AKI on CKD.  Patient has  CKD3b (GFR 35) & is also followed by Nephrologist Dr Vallery Sa.  Patient is followed by Cardiology - Dr Daphene Jaeger & Edd Fabian, NP.  Patient's BP has been controlled at home and patient denies any cardiac symptoms as chest pain, palpitations, shortness of breath, dizziness or ankle swelling. Today's BP was initially slightly elevated & rechecked at goal - 134/76 .         Patient's hyperlipidemia is not controlled with diet and medications. Patient denies myalgias or other medication SE's. Last lipids were near goal :  Lab Results  Component Value Date   CHOL 190 06/14/2023   HDL 73 06/14/2023   LDLCALC 102 (H) 06/14/2023   TRIG 68 06/14/2023   CHOLHDL 2.6 06/14/2023         Patient has hx/o T2_NIDDM  (A1c 6.6% /2011) w/CKD3b (GFR  42) and she has been attempting control with diet.  Patient denies reactive hypoglycemic symptoms, visual blurring, diabetic polys or paresthesias. Last A1c was not at goal :  Lab Results  Component Value Date   HGBA1C 6.7 (H) 06/14/2023         Patient has a Vitamin B12 Deficiency and also has Vitamin D  Deficiency (23" /2008) and last Vitamin D was  not at goal  ( 70-100 ) :   Lab Results  Component Value Date   VD25OH 49 03/11/2023       Current Outpatient Medications on File Prior to Visit  Medication Sig   acetaminophen 500 MG tablet Take every 6 (six) hours as needed for moderate pain.   amiodarone 200 MG tablet Take 1 tablet once daily   apixaban (ELIQUIS) 2.5 MG TABS  Take 1 tablet twice daily   VITAMIN D 1,000 Units   Take daily.   Cyanocobalamin (B-12 SL) Place 1 tablet under the tongue daily.   diazepam 2 MG tablet Take  1 tablet  4 x /day  for Tremor     diltiazem  CD 180 MG  Take  1 capsule  Daily     furosemide  80 MG tablet Take 1 tablet twice daily  Melatonin 10 MG TABS Take 1 tablet daily.   polyethylene glycol 17 g packet Take 1daily.   potassium chloride 20 MEQ tablet Take 10 mEq daily   PROAIR HFA inhaler Inhale 2 puffs  every 6 hours as needed   Pyridoxine HCl (VITAMIN B-6) Take 1 capsule  daily.   vitamin C  500 MG tablet Take  daily.   zinc 50 MG tablet Take daily.     Allergies  Allergen Reactions   Accupril [Quinapril Hcl] Cough   Neosporin     Prednisone Agitated and shaky   Reglan [Metoclopramide] tremors   Tramadol Nausea And Vomiting   Adhesive [Tape] Rash     Past Medical History:  Diagnosis Date   Acute on chronic diastolic (congestive) heart failure (HCC) 11/21/2018   Cancer (HCC)    skin cancer   DDD (degenerative disc disease)    Diverticula of colon    Diverticulitis    DJD (degenerative joint disease)    GERD (gastroesophageal reflux disease)    takes ranitidine   Heart murmur    History of hiatal hernia    History of pneumonia    Hx of irritable bowel syndrome    Hyperlipidemia    Hypertension    Occasional tremors    Osteoporosis    Pre-diabetes    Varicose veins    Vitamin D deficiency      Health Maintenance  Topic Date Due   COVID-19 Vaccine (1) Never done   Zoster Vaccines- Shingrix (1 of 2) Never done    HEMOGLOBIN A1C  02/10/2022   FOOT EXAM  03/04/2022   OPHTHALMOLOGY EXAM  03/05/2022   INFLUENZA VACCINE  06/16/2022   URINE MICROALBUMIN  08/13/2022   TETANUS/TDAP  09/04/2025   Pneumonia Vaccine 35+ Years old  Completed   DEXA SCAN  Completed   HPV VACCINES  Aged Out     Immunization History  Administered Date(s) Administered   DT  09/05/2015   DTaP 11/16/2004   Influenza Whole 08/30/2013   Influenza, High Dose  08/10/2016, 11/20/2019, 09/10/2020   Pneumococcal -13 10/19/2016   Pneumococcal -23 11/16/2001   Zoster, Live 06/01/2013    Last Colon - 01/21/2010 - Dr Russella Dar - recc 10 yr f/u Mar 2022 - Aged out Flex Sig  03/08/2021   -  Severe Diverticular Dz.   Last MGM - 04/02/2023   Past Surgical History:  Procedure Laterality Date   APPENDECTOMY     BREAST EXCISIONAL BIOPSY Right    BREAST SURGERY Right    fluid drained from breast   CARDIOVERSION N/A 12/20/2018   Procedure: CARDIOVERSION;  Surgeon: [name], MD;  Location: [facility] ENDOSCOPY;  Service: Cardiovascular;  Laterality: N/A;   CARDIOVERSION N/A 06/16/2019   Procedure: CARDIOVERSION;  Surgeon: [name], MD;  Location: [facility] ENDOSCOPY;  Service: Cardiovascular;  Laterality: N/A;   COLONOSCOPY     ESOPHAGOGASTRODUODENOSCOPY     FLEXIBLE SIGMOIDOSCOPY N/A 03/09/2021   Procedure: FLEXIBLE SIGMOIDOSCOPY;  Surgeon: [name], MD;  Location: [facility] ENDOSCOPY;  Service: Endoscopy;  Laterality: N/A;   HEMOSTASIS CLIP PLACEMENT  03/09/2021   Procedure: HEMOSTASIS CLIP PLACEMENT;  Surgeon: [name], MD;  Location: [facility] ENDOSCOPY;  Service: Endoscopy;;   JOINT REPLACEMENT Left    left hip   LUMBAR LAMINECTOMY/DECOMPRESSION MICRODISCECTOMY N/A 06/05/2015   Procedure: L3-L5 DECOMPRESSION ;  Surgeon: [name], MD;  Location: [facility] OR;  Service: Orthopedics;  Laterality: N/A;   OPEN REDUCTION INTERNAL FIXATION (ORIF) DISTAL RADIAL FRACTURE Right  01/10/2014   Procedure: OPEN REDUCTION INTERNAL FIXATION  (ORIF) DISTAL RADIAL FRACTURE;  Surgeon: [name], MD;  Location: [facility] OR;  Service: Orthopedics;  Laterality: Right;   SPINAL CORD DECOMPRESSION  06/05/2015   L3 L 5   THYROID SURGERY     TONSILLECTOMY     TUBAL LIGATION     varicose veins stripped     WRIST FRACTURE SURGERY Bilateral      Family History  Problem Relation Age of Onset   Hypertension Mother    Hyperlipidemia Mother    Heart disease Sister    Cancer Brother        prostate   Colon cancer Neg Hx    Esophageal cancer Neg Hx    Pancreatic cancer Neg Hx    Stomach cancer Neg Hx    Liver disease Neg Hx      Social History   Tobacco Use   Smoking status: Never   Smokeless tobacco: Never  Substance Use Topics   Alcohol use: No   Drug use: No      ROS Constitutional: Denies fever, chills, weight loss/gain, headaches, insomnia,  night sweats, and change in appetite. Does c/o fatigue. Eyes: Denies redness, blurred vision, diplopia, discharge, itchy, watery eyes.  ENT: Denies discharge, congestion, post nasal drip, epistaxis, sore throat, earache, hearing loss, dental pain, Tinnitus, Vertigo, Sinus pain, snoring.  Cardio: Denies chest pain, palpitations, irregular heartbeat, syncope, dyspnea, diaphoresis, orthopnea, PND, claudication, edema Respiratory: denies cough, dyspnea, DOE, pleurisy, hoarseness, laryngitis, wheezing.  Gastrointestinal: Denies dysphagia, heartburn, reflux, water brash, pain, cramps, nausea, vomiting, bloating, diarrhea, constipation, hematemesis, melena, hematochezia, jaundice, hemorrhoids Genitourinary: Denies dysuria, frequency, urgency, nocturia, hesitancy, discharge, hematuria, flank pain Breast: Breast lumps, nipple discharge, bleeding.  Musculoskeletal: Denies arthralgia, myalgia, stiffness, Jt. Swelling, pain, limp, and strain/sprain. Denies falls. Skin: Denies puritis, rash, hives, warts, acne, eczema, changing in skin lesion Neuro: No weakness, tremor, incoordination, spasms,  paresthesia, pain Psychiatric: Denies confusion, memory loss, sensory loss. Denies Depression. Endocrine: Denies change in weight, skin, hair change, nocturia, and paresthesia, diabetic polys, visual blurring, hyper / hypo glycemic episodes.  Heme/Lymph: No excessive bleeding, bruising, enlarged lymph nodes.  Physical Exam  BP 134/76   Pulse 67   Temp 97.9 F (36.6 C)   Resp 16   Ht 5' (1.524 m)   Wt 127 lb (57.6 kg)   SpO2 96%   BMI 24.80 kg/m   General Appearance:  older, well groomed and in no apparent distress.  Eyes: PERRLA, EOMs, conjunctiva no swelling or erythema, normal fundi and vessels. Sinuses: No frontal/maxillary tenderness ENT/Mouth: EACs patent / TMs  nl. Nares clear without erythema, swelling, mucoid exudates. Oral hygiene is good. No erythema, swelling, or exudate. Tongue normal, non-obstructing. Tonsils not swollen or erythematous. Hearing normal.  Neck: Supple, thyroid not palpable. No bruits, nodes or JVD. Respiratory: Respiratory effort normal.  BS equal and clear bilateral without rales, rhonci, wheezing or stridor. Cardio: Heart sounds are normal with regular rate and rhythm and no murmurs, rubs or gallops. Peripheral pulses are normal and equal bilaterally without edema. No aortic or femoral bruits. Chest: symmetric with normal excursions and percussion. Breasts: Deferred to upcoming MGM. Abdomen: Flat, soft with bowel sounds active. Nontender, no guarding, rebound, hernias, masses, or organomegaly.  Lymphatics: Non tender without lymphadenopathy.  Musculoskeletal: Full ROM all peripheral extremities, joint stability, 5/5 strength, and normal gait. Skin: Warm and dry without rashes, lesions, cyanosis or clubbing.   Normal Hip ROM bilateral.  Neuro: Cranial nerves intact, reflexes equal bilaterally. Normal muscle tone, no cerebellar symptoms. Sensation intact.  Pysch: Alert and oriented X 3, normal affect, Insight and Judgment appropriate.     Assessment and Plan  1. Essential hypertension  - CBC with Differential/Platelet - TSH - COMPLETE METABOLIC PANEL WITH GFR - Magnesium  2. Hyperlipidemia associated with type 2 diabetes mellitus (HCC)  - Lipid panel  3. Controlled type 2 diabetes mellitus with stage 3 chronic  kidney disease, without long-term current use of insulin (HCC)  - Hemoglobin A1c - Insulin, random  4. Chronic atrial fibrillation (HCC)  - TSH  5. Vitamin D deficiency  - VITAMIN D 25 Hydroxy   6. Type 2 diabetes mellitus with stage 3b chronic kidney  disease, without long-term current use of insulin (HCC)  - Hemoglobin A1c - Insulin, random  7. Aortic atherosclerosis (HCC) by Chest CTA on 11/21/2018.   - Lipid panel  8. Medication management  - CBC with Differential/Platelet - TSH - Hemoglobin A1c - Insulin, random - VITAMIN D 25 Hydroxy  - COMPLETE METABOLIC PANEL WITH GFR - Magnesium - Lipid panel          Patient was counseled in prudent diet to achieve/maintain BMI less than 25 for weight control, BP monitoring, regular exercise and medications. Given Rx for Lamisil for Onychomycosis of Rt great toenail.  Discussed med's effects and SE's. Screening labs and tests as requested with regular follow-up as recommended. Over 40 minutes of exam, counseling, chart review and high complex critical decision making was performed.   Marinus Maw, MD

## 2023-09-15 ENCOUNTER — Other Ambulatory Visit: Payer: Self-pay | Admitting: Internal Medicine

## 2023-09-15 DIAGNOSIS — I482 Chronic atrial fibrillation, unspecified: Secondary | ICD-10-CM

## 2023-09-15 LAB — CBC WITH DIFFERENTIAL/PLATELET
Absolute Lymphocytes: 735 {cells}/uL — ABNORMAL LOW (ref 850–3900)
Absolute Monocytes: 667 {cells}/uL (ref 200–950)
Basophils Absolute: 40 {cells}/uL (ref 0–200)
Basophils Relative: 0.7 %
Eosinophils Absolute: 63 {cells}/uL (ref 15–500)
Eosinophils Relative: 1.1 %
HCT: 40.6 % (ref 35.0–45.0)
Hemoglobin: 13 g/dL (ref 11.7–15.5)
MCH: 29.9 pg (ref 27.0–33.0)
MCHC: 32 g/dL (ref 32.0–36.0)
MCV: 93.3 fL (ref 80.0–100.0)
MPV: 11.3 fL (ref 7.5–12.5)
Monocytes Relative: 11.7 %
Neutro Abs: 4195 {cells}/uL (ref 1500–7800)
Neutrophils Relative %: 73.6 %
Platelets: 212 10*3/uL (ref 140–400)
RBC: 4.35 10*6/uL (ref 3.80–5.10)
RDW: 12.7 % (ref 11.0–15.0)
Total Lymphocyte: 12.9 %
WBC: 5.7 10*3/uL (ref 3.8–10.8)

## 2023-09-15 LAB — COMPLETE METABOLIC PANEL WITH GFR
AG Ratio: 2.3 (calc) (ref 1.0–2.5)
ALT: 18 U/L (ref 6–29)
AST: 19 U/L (ref 10–35)
Albumin: 4.5 g/dL (ref 3.6–5.1)
Alkaline phosphatase (APISO): 108 U/L (ref 37–153)
BUN/Creatinine Ratio: 20 (calc) (ref 6–22)
BUN: 27 mg/dL — ABNORMAL HIGH (ref 7–25)
CO2: 29 mmol/L (ref 20–32)
Calcium: 10.2 mg/dL (ref 8.6–10.4)
Chloride: 105 mmol/L (ref 98–110)
Creat: 1.34 mg/dL — ABNORMAL HIGH (ref 0.60–0.95)
Globulin: 2 g/dL (ref 1.9–3.7)
Glucose, Bld: 101 mg/dL — ABNORMAL HIGH (ref 65–99)
Potassium: 4.2 mmol/L (ref 3.5–5.3)
Sodium: 141 mmol/L (ref 135–146)
Total Bilirubin: 0.5 mg/dL (ref 0.2–1.2)
Total Protein: 6.5 g/dL (ref 6.1–8.1)
eGFR: 38 mL/min/{1.73_m2} — ABNORMAL LOW (ref >=60)

## 2023-09-15 LAB — HEMOGLOBIN A1C
Hgb A1c MFr Bld: 6.5 %{Hb} — ABNORMAL HIGH (ref <5.7)
Mean Plasma Glucose: 140 mg/dL
eAG (mmol/L): 7.7 mmol/L

## 2023-09-15 LAB — LIPID PANEL
Cholesterol: 187 mg/dL (ref <200)
HDL: 72 mg/dL (ref >=50)
LDL Cholesterol (Calc): 101 mg/dL — ABNORMAL HIGH
Non-HDL Cholesterol (Calc): 115 mg/dL (ref <130)
Total CHOL/HDL Ratio: 2.6 (calc) (ref <5.0)
Triglycerides: 55 mg/dL (ref <150)

## 2023-09-15 LAB — INSULIN, RANDOM: Insulin: 10.6 u[IU]/mL

## 2023-09-15 LAB — VITAMIN D 25 HYDROXY (VIT D DEFICIENCY, FRACTURES): Vit D, 25-Hydroxy: 41 ng/mL (ref 30–100)

## 2023-09-15 LAB — MAGNESIUM: Magnesium: 2.3 mg/dL (ref 1.5–2.5)

## 2023-09-15 LAB — TSH: TSH: 1.03 m[IU]/L (ref 0.40–4.50)

## 2023-09-15 MED ORDER — RIVAROXABAN 15 MG PO TABS: ORAL_TABLET | ORAL | Status: DC

## 2023-09-15 NOTE — Progress Notes (Signed)
. <>*<>*<>*<>*<>*<>*<>*<>*<>*<>*<>*<>*<>*<>*<>*<>*<>*<>*<>*<>*<>*<>*<>*<>*<> <>*<>*<>*<>*<>*<>*<>*<>*<>*<>*<>*<>*<>*<>*<>*<>*<>*<>*<>*<>*<>*<>*<>*<>*<>  -    Kidney functions have improved about 25% over the last 6 month S  - Great !    <>*<>*<>*<>*<>*<>*<>*<>*<>*<>*<>*<>*<>*<>*<>*<>*<>*<>*<>*<>*<>*<>*<>*<>*<> <>*<>*<>*<>*<>*<>*<>*<>*<>*<>*<>*<>*<>*<>*<>*<>*<>*<>*<>*<>*<>*<>*<>*<>*<>  - Chol = 187 - Great  &   Good HDL = 72 is Very High - Wonderful - Very low risk for heart attack   <>*<>*<>*<>*<>*<>*<>*<>*<>*<>*<>*<>*<>*<>*<>*<>*<>*<>*<>*<>*<>*<>*<>*<>*<> <>*<>*<>*<>*<>*<>*<>*<>*<>*<>*<>*<>*<>*<>*<>*<>*<>*<>*<>*<>*<>*<>*<>*<>*<>  -   A1c is better - down from 7.9% and 6.7% to now 6.5% , but still a little too high   - Ideal or goal is less than 5.7%   - So    - Avoid Sweets, Candy & White Stuff   - White Rice, White Potatoes, White Flour  - Breads &  Pasta  <>*<>*<>*<>*<>*<>*<>*<>*<>*<>*<>*<>*<>*<>*<>*<>*<>*<>*<>*<>*<>*<>*<>*<>*<> <>*<>*<>*<>*<>*<>*<>*<>*<>*<>*<>*<>*<>*<>*<>*<>*<>*<>*<>*<>*<>*<>*<>*<>*<>  -   Vitamin D = 41 is Low - Recommend INCREASE                                                                  your Vitamin D to 5,000 unit capsule Daily   <>*<>*<>*<>*<>*<>*<>*<>*<>*<>*<>*<>*<>*<>*<>*<>*<>*<>*<>*<>*<>*<>*<>*<>*<> <>*<>*<>*<>*<>*<>*<>*<>*<>*<>*<>*<>*<>*<>*<>*<>*<>*<>*<>*<>*<>*<>*<>*<>*<>  - All Else - CBC -  Electrolytes - Liver - Magnesium & Thyroid    - all  Normal / OK  <>*<>*<>*<>*<>*<>*<>*<>*<>*<>*<>*<>*<>*<>*<>*<>*<>*<>*<>*<>*<>*<>*<>*<>*<> <>*<>*<>*<>*<>*<>*<>*<>*<>*<>*<>*<>*<>*<>*<>*<>*<>*<>*<>*<>*<>*<>*<>*<>*<>

## 2023-09-18 ENCOUNTER — Encounter: Payer: Self-pay | Admitting: Internal Medicine

## 2023-09-23 ENCOUNTER — Ambulatory Visit: Payer: Medicare Other | Admitting: Nurse Practitioner

## 2023-09-23 ENCOUNTER — Encounter: Payer: Self-pay | Admitting: Nurse Practitioner

## 2023-09-23 VITALS — BP 136/70 | HR 62 | Temp 97.9°F | Resp 16 | Ht 60.0 in | Wt 128.0 lb

## 2023-09-23 DIAGNOSIS — I10 Essential (primary) hypertension: Secondary | ICD-10-CM

## 2023-09-23 DIAGNOSIS — S46211A Strain of muscle, fascia and tendon of other parts of biceps, right arm, initial encounter: Secondary | ICD-10-CM

## 2023-09-23 NOTE — Progress Notes (Signed)
Assessment and Plan:  Briana [name] was seen today for acute visit.  Diagnoses and all orders for this visit:  Tear of right biceps muscle, initial encounter Keep arm in sling as much as possible Use Tylenol 500 mg 2 tabs 3 times a day Follow up in 1 week If pain worsens please go to ER  Essential hypertension - continue medications:  amiodarone 200 mg every day, diltiazem 180 mg every day and furosemide 40 mg every day  - Continue DASH diet, exercise and monitor at home. Call if greater than 130/80.        Further disposition pending results of labs. Discussed med's effects and SE's.   Over 30 minutes of exam, counseling, chart review, and critical decision making was performed.   Future Appointments  Date Time Provider Department Center  09/23/2023  2:00 PM Raynelle Dick, NP [facility] None  12/15/2023 11:00 AM Raynelle Dick, NP [facility] None  03/14/2024  3:00 PM [name], MD [facility] None  06/13/2024 11:30 AM Raynelle Dick, NP [facility] None    ------------------------------------------------------------------------------------------------------------------   HPI BP 136/70   Pulse 62   Temp 97.9 F (36.6 C)   Resp 16   Ht 5' (1.524 m)   Wt 128 lb (58.1 kg)   SpO2 97%   BMI 25.00 kg/m  89 y.o.female presents for ecchymotic area that is painful on right upper arm that was present when she awoke on Tuesday morning. May have hit side of door casing. She has not been able to sleep due to the pain. She is on Xarelto 15 mg every day for atrial fibrillation .   BP well controlled with amiodarone 200 mg every day, diltiazem 180 mg every day and furosemide 40 mg every day  BP Readings from Last 3 Encounters:  09/23/23 136/70  09/14/23 134/76  09/13/23 (!) 152/70  Denies headaches, chest pain, shortness of breath and dizziness   BMI is Body mass index is 25 kg/m., she has not been working on diet and exercise. Wt Readings from Last 3 Encounters:   09/23/23 128 lb (58.1 kg)  09/14/23 127 lb (57.6 kg)  09/13/23 128 lb 6.4 oz (58.2 kg)    Past Medical History:  Diagnosis Date   Acute on chronic diastolic (congestive) heart failure (HCC) 11/21/2018   Cancer (HCC)    skin cancer   DDD (degenerative disc disease)    Diverticula of colon    Diverticulitis    DJD (degenerative joint disease)    GERD (gastroesophageal reflux disease)    takes ranitidine   Heart murmur    History of hiatal hernia    History of pneumonia    Hx of irritable bowel syndrome    Hyperlipidemia    Hypertension    Occasional tremors    Osteoporosis    Pre-diabetes    Varicose veins    Vitamin D deficiency      Allergies  Allergen Reactions   Codeine Rash   Accupril [Quinapril Hcl] Cough   Prednisone     Agitated and shaky   Reglan [Metoclopramide] Other (See Comments)    tremors   Tramadol Nausea And Vomiting   Adhesive [Tape] Rash   Neosporin [Neomycin-Bacitracin Zn-Polymyx] Itching and Rash    Current Outpatient Medications on File Prior to Visit  Medication Sig   acetaminophen (TYLENOL) 500 MG tablet Take 500 mg by mouth every 6 (six) hours as needed for moderate pain.   albuterol (PROAIR HFA) 108 (90 Base) MCG/ACT inhaler Take  2 inhalations  15 minutes apart  every 4 hours  as needed for Wheezing   amiodarone (PACERONE) 200 MG tablet Take 1 tablet by mouth once daily   Ascorbic Acid (VITAMIN C PO) Take 1 tablet by mouth at bedtime.   Boswellia-Glucosamine-Vit D (OSTEO BI-FLEX ONE PER DAY PO) Take 1 tablet by mouth in the morning.   Carboxymeth-Glycerin-Polysorb (REFRESH DIGITAL OP) Place 1 drop into the right eye daily as needed (dry eyes).   cholecalciferol (VITAMIN D3) 25 MCG (1000 UNIT) tablet Take 3,000 Units by mouth daily.   Cyanocobalamin (B-12 PO) Take 1 capsule by mouth daily.   diazepam (VALIUM) 2 MG tablet Take 1 tablet 3 - 4 x /day for Tremor                                     /                                 TAKE                     BY                     MOUTH (Patient taking differently: Take 2 mg by mouth as needed (Tremors).)   diltiazem (CARDIZEM CD) 180 MG 24 hr capsule Take 1 capsule  Daily for Heart & BP                                                                                                   /                          TAKE                                      BY                                            MOUTH                                                   ONCE ? DAILY (Patient taking differently: Take 180 mg by mouth in the morning.)   Ferrous Sulfate (SLOW RELEASE IRON PO) Take 1 tablet by mouth at bedtime.   furosemide (LASIX) 40 MG tablet Take 1 tablet (40 mg total) by mouth daily.   loratadine (CLARITIN) 10 MG tablet Take 10 mg by mouth daily as needed for allergies.   Multiple Vitamins-Minerals (ZINC PO) Take 1 capsule by mouth daily.   polyethylene glycol (  MIRALAX / GLYCOLAX) 17 g packet Take 17 g by mouth daily.   Pyridoxine HCl (B-6 PO) Take 1 tablet by mouth daily.   Rivaroxaban (XARELTO) 15 MG TABS tablet Take 1 tablet  Daily for A Fib & to Prevent Blood Clots   Simethicone (GAS-X PO) Take 1 tablet by mouth daily as needed (gas).   No current facility-administered medications on file prior to visit.    ROS: all negative except above.   Physical Exam:  BP 136/70   Pulse 62   Temp 97.9 F (36.6 C)   Resp 16   Ht 5' (1.524 m)   Wt 128 lb (58.1 kg)   SpO2 97%   BMI 25.00 kg/m   General Appearance: Pleasant female who appears in pain Eyes: PERRLA, EOMs, conjunctiva no swelling or erythema Sinuses: No Frontal/maxillary tenderness Neck: Supple, thyroid normal.  Respiratory: Respiratory effort normal, BS equal bilaterally without rales, rhonchi, wheezing or stridor.  Cardio: RRR with no MRGs. Brisk peripheral pulses without edema.  Abdomen: Soft, + BS.  Non tender, no guarding, rebound, hernias, masses. Lymphatics: Non tender without lymphadenopathy.  Musculoskeletal: right  shoulder tender swollen area approximately 2-3 inches down right arm, large ecchymotic area to elbow, very tender to palpation Skin: Warm, dry . Large ecchymosis of right arm from 2-3 inches below shoulder to elbow.  Neuro: Cranial nerves intact. Normal muscle tone, no cerebellar symptoms. Sensation intact.  Psych: Awake and oriented X 3, normal affect, Insight and Judgment appropriate.     Raynelle Dick, NP 1:58 PM [facility]

## 2023-09-23 NOTE — Patient Instructions (Addendum)
Keep arm in sling as much as possible Use Tylenol 500 mg 2 tabs 3 times a day Follow up in 1 week  Proximal Biceps Tendon Tear  The proximal biceps tendon is a strong cord of tissue that connects the biceps muscle to the shoulder blade. The biceps muscle is a muscle on the front of the upper arm. A proximal biceps tendon tear can include a partial or complete tear of the tendon near where it connects to the bone of the shoulder. This injury can interfere with your ability to lift your arm in front of your body, stabilize your shoulder, bend your elbow, and turn your hand palm-up. What are the causes? This condition happens when too much force is placed on the tendon. This excess force may be caused by: The elbow being suddenly straightened from a bent position because of an external force. This could happen, for example, while catching a heavy weight or being pulled when waterskiing. Wear and tear from physical activity. Breaking a fall with your hand. What increases the risk? The following factors may make you more likely to develop this condition: Playing contact sports. Doing activities or sports that involve throwing or overhead movements, such as racket sports, gymnastics, or baseball. Doing activities or sports that involve putting sudden force on the arm, such as weight lifting or waterskiing. Having a weakened tendon because of: Long-lasting (chronic) biceps tendinitis. Certain medical conditions, such as diabetes or rheumatoid arthritis. Repeated use of steroids. Repetitive overhead movements. What are the signs or symptoms? Symptoms of this condition may include: Sudden sharp pain in the front of the shoulder. Pain may get worse during certain movements, such as: Lifting or carrying objects. Straightening the elbow. Throwing or using overhead movements. Swelling or a feeling of unusual warmth on the front of the shoulder. Painful, sudden tightening (spasm) of the biceps  muscle. A bulge on the inside of the upper arm when the elbow is bent. Bruising in the shoulder or upper arm. Limited range of motion of the shoulder and elbow. Weakness in the elbow and forearm. How is this diagnosed? This condition may be diagnosed based on: Your symptoms and medical history. A physical exam. Your health care provider may test the strength and range of motion of your shoulder and elbow. Imaging tests, such as X-ray, MRI, or ultrasound. How is this treated? Treatment depends on the severity of the condition. Treatment may include: Medicines to help relieve pain and inflammation. Resting from sports and intense physical activity. Avoiding certain activities that put stress on your shoulder. Physical therapy. Surgery to repair the tear. This may be needed if other treatments have not worked. One or more injections of corticosteroids into your upper arm to help reduce inflammation. This treatment is rare. Follow these instructions at home: Managing pain, stiffness, and swelling     If directed, put ice on the injured area. To do this: Put ice in a plastic bag. Place a towel between your skin and the bag. Leave the ice on for 20 minutes, 2-3 times a day. If directed, apply heat to the affected area before you exercise or as often as told by your health care provider. Use the heat source that your health care provider recommends, such as a moist heat pack or a heating pad. Place a towel between your skin and the heat source. Leave the heat on for 20-30 minutes. If your skin turns bright red, remove the ice or heat right away to prevent skin damage.  The risk of damage is higher if you cannot feel pain, heat, or cold. Move your fingers often to avoid stiffness and to lessen swelling. Raise (elevate) the injured area while you are sitting or lying down. Activity Avoid activities that cause pain or make your condition worse. You may have to avoid lifting. Ask your health  care provider how much you can safely lift. Do exercises as told by your physical therapist or health care provider. Return to your normal activities as told by your health care provider. Ask your health care provider what activities are safe for you. General instructions Take over-the-counter and prescription medicines only as told by your health care provider. Do not use any products that contain nicotine or tobacco. These products include cigarettes, chewing tobacco, and vaping devices, such as e-cigarettes. These can delay healing. If you need help quitting, ask your health care provider. Keep all follow-up visits. Your health care provider will check if your injury is healing. How is this prevented? Take these steps to help prevent reinjury: Warm up and stretch before being active. Cool down and stretch after being active. Give your body time to rest between periods of activity. Make sure you use equipment that fits you. Be safe and responsible while being active. This will help you avoid falls. Maintain physical fitness, including strength and flexibility. Contact a health care provider if: You have symptoms that get worse or do not get better after 2 weeks of treatment. You develop new symptoms. Get help right away if: You have severe pain. You develop pain or numbness in your hand. Your hand feels unusually cold. Your fingernails turn a dark color, such as blue or gray. This information is not intended to replace advice given to you by your health care provider. Make sure you discuss any questions you have with your health care provider. Document Revised: 03/30/2022 Document Reviewed: 03/30/2022 Elsevier Patient Education  2024 [employer].

## 2023-09-29 NOTE — Progress Notes (Unsigned)
Assessment and Plan:  Tsion was seen today for acute visit.  Diagnoses and all orders for this visit:  Tear of right biceps muscle, initial encounter Keep arm in sling as much as possible Use Tylenol 500 mg 2 tabs 3 times a day Follow up in 1 week If pain worsens please go to ER  Essential hypertension - continue medications:  amiodarone 200 mg every day, diltiazem 180 mg every day and furosemide 40 mg every day  - Continue DASH diet, exercise and monitor at home. Call if greater than 130/80.        Further disposition pending results of labs. Discussed med's effects and SE's.   Over 30 minutes of exam, counseling, chart review, and critical decision making was performed.   Future Appointments  Date Time Provider Department Center  09/30/2023  2:45 PM Raynelle Dick, NP [facility] None  12/15/2023 11:00 AM Raynelle Dick, NP [facility] None  03/14/2024  3:00 PM [name], MD [facility] None  06/13/2024 11:30 AM Raynelle Dick, NP [facility] None    ------------------------------------------------------------------------------------------------------------------   HPI There were no vitals taken for this visit. 89 y.o.female presents for ecchymotic area that is painful on right upper arm that was present when she awoke on Tuesday morning. May have hit side of door casing. She has not been able to sleep due to the pain. She is on Xarelto 15 mg every day for atrial fibrillation .   BP well controlled with amiodarone 200 mg every day, diltiazem 180 mg every day and furosemide 40 mg every day  BP Readings from Last 3 Encounters:  09/23/23 136/70  09/14/23 134/76  09/13/23 (!) 152/70  Denies headaches, chest pain, shortness of breath and dizziness   BMI is There is no height or weight on file to calculate BMI., she has not been working on diet and exercise. Wt Readings from Last 3 Encounters:  09/23/23 128 lb (58.1 kg)  09/14/23 127 lb (57.6 kg)  09/13/23 128  lb 6.4 oz (58.2 kg)    Past Medical History:  Diagnosis Date   Acute on chronic diastolic (congestive) heart failure (HCC) 11/21/2018   Cancer (HCC)    skin cancer   DDD (degenerative disc disease)    Diverticula of colon    Diverticulitis    DJD (degenerative joint disease)    GERD (gastroesophageal reflux disease)    takes ranitidine   Heart murmur    History of hiatal hernia    History of pneumonia    Hx of irritable bowel syndrome    Hyperlipidemia    Hypertension    Occasional tremors    Osteoporosis    Pre-diabetes    Varicose veins    Vitamin D deficiency      Allergies  Allergen Reactions   Codeine Rash   Accupril [Quinapril Hcl] Cough   Prednisone     Agitated and shaky   Reglan [Metoclopramide] Other (See Comments)    tremors   Tramadol Nausea And Vomiting   Adhesive [Tape] Rash   Neosporin [Neomycin-Bacitracin Zn-Polymyx] Itching and Rash    Current Outpatient Medications on File Prior to Visit  Medication Sig   acetaminophen (TYLENOL) 500 MG tablet Take 500 mg by mouth every 6 (six) hours as needed for moderate pain.   albuterol (PROAIR HFA) 108 (90 Base) MCG/ACT inhaler Take  2 inhalations  15 minutes apart  every 4 hours  as needed for Wheezing   amiodarone (PACERONE) 200 MG tablet Take 1 tablet by  mouth once daily   Ascorbic Acid (VITAMIN C PO) Take 1 tablet by mouth at bedtime.   Boswellia-Glucosamine-Vit D (OSTEO BI-FLEX ONE PER DAY PO) Take 1 tablet by mouth in the morning.   Carboxymeth-Glycerin-Polysorb (REFRESH DIGITAL OP) Place 1 drop into the right eye daily as needed (dry eyes).   cholecalciferol (VITAMIN D3) 25 MCG (1000 UNIT) tablet Take 3,000 Units by mouth daily.   Cyanocobalamin (B-12 PO) Take 1 capsule by mouth daily.   diazepam (VALIUM) 2 MG tablet Take 1 tablet 3 - 4 x /day for Tremor                                     /                                 TAKE                    BY                     MOUTH (Patient taking differently:  Take 2 mg by mouth as needed (Tremors).)   diltiazem (CARDIZEM CD) 180 MG 24 hr capsule Take 1 capsule  Daily for Heart & BP                                                                                                   /                          TAKE                                      BY                                            MOUTH                                                   ONCE ? DAILY (Patient taking differently: Take 180 mg by mouth in the morning.)   Ferrous Sulfate (SLOW RELEASE IRON PO) Take 1 tablet by mouth at bedtime.   furosemide (LASIX) 40 MG tablet Take 1 tablet (40 mg total) by mouth daily.   loratadine (CLARITIN) 10 MG tablet Take 10 mg by mouth daily as needed for allergies.   Multiple Vitamins-Minerals (ZINC PO) Take 1 capsule by mouth daily.   polyethylene glycol (MIRALAX / GLYCOLAX) 17 g packet Take 17 g by mouth daily.   Pyridoxine HCl (B-6 PO) Take 1 tablet by mouth daily.  Rivaroxaban (XARELTO) 15 MG TABS tablet Take 1 tablet  Daily for A Fib & to Prevent Blood Clots   Simethicone (GAS-X PO) Take 1 tablet by mouth daily as needed (gas).   No current facility-administered medications on file prior to visit.    ROS: all negative except above.   Physical Exam:  There were no vitals taken for this visit.  General Appearance: Pleasant female who appears in pain Eyes: PERRLA, EOMs, conjunctiva no swelling or erythema Sinuses: No Frontal/maxillary tenderness Neck: Supple, thyroid normal.  Respiratory: Respiratory effort normal, BS equal bilaterally without rales, rhonchi, wheezing or stridor.  Cardio: RRR with no MRGs. Brisk peripheral pulses without edema.  Abdomen: Soft, + BS.  Non tender, no guarding, rebound, hernias, masses. Lymphatics: Non tender without lymphadenopathy.  Musculoskeletal: right shoulder tender swollen area approximately 2-3 inches down right arm, large ecchymotic area to elbow, very tender to palpation Skin: Warm, dry . Large  ecchymosis of right arm from 2-3 inches below shoulder to elbow.  Neuro: Cranial nerves intact. Normal muscle tone, no cerebellar symptoms. Sensation intact.  Psych: Awake and oriented X 3, normal affect, Insight and Judgment appropriate.     Raynelle Dick, NP 4:04 PM [facility]

## 2023-09-30 ENCOUNTER — Encounter: Payer: Self-pay | Admitting: Nurse Practitioner

## 2023-09-30 ENCOUNTER — Ambulatory Visit: Payer: Medicare Other | Admitting: Nurse Practitioner

## 2023-09-30 VITALS — BP 142/70 | HR 61 | Temp 97.7°F | Ht 60.0 in

## 2023-09-30 DIAGNOSIS — M79621 Pain in right upper arm: Secondary | ICD-10-CM

## 2023-09-30 NOTE — Patient Instructions (Signed)
GO to [facility] urgent care for evaluation

## 2023-10-04 ENCOUNTER — Other Ambulatory Visit: Payer: Self-pay | Admitting: Cardiovascular Disease

## 2023-10-05 DIAGNOSIS — M25521 Pain in right elbow: Secondary | ICD-10-CM | POA: Diagnosis not present

## 2023-10-05 DIAGNOSIS — M79621 Pain in right upper arm: Secondary | ICD-10-CM | POA: Diagnosis not present

## 2023-10-06 DIAGNOSIS — M25521 Pain in right elbow: Secondary | ICD-10-CM | POA: Diagnosis not present

## 2023-10-13 DIAGNOSIS — M25521 Pain in right elbow: Secondary | ICD-10-CM | POA: Diagnosis not present

## 2023-10-13 DIAGNOSIS — M67814 Other specified disorders of tendon, left shoulder: Secondary | ICD-10-CM | POA: Diagnosis not present

## 2023-10-13 DIAGNOSIS — S56812A Strain of other muscles, fascia and tendons at forearm level, left arm, initial encounter: Secondary | ICD-10-CM | POA: Diagnosis not present

## 2023-11-08 ENCOUNTER — Other Ambulatory Visit (HOSPITAL_COMMUNITY): Payer: Self-pay | Admitting: Physician Assistant

## 2023-11-08 DIAGNOSIS — I482 Chronic atrial fibrillation, unspecified: Secondary | ICD-10-CM

## 2023-11-20 ENCOUNTER — Emergency Department (HOSPITAL_COMMUNITY): Payer: Medicare Other

## 2023-11-20 ENCOUNTER — Other Ambulatory Visit: Payer: Self-pay

## 2023-11-20 ENCOUNTER — Observation Stay (HOSPITAL_COMMUNITY): Payer: Medicare Other | Admitting: Anesthesiology

## 2023-11-20 ENCOUNTER — Encounter (HOSPITAL_COMMUNITY): Payer: Self-pay

## 2023-11-20 ENCOUNTER — Encounter (HOSPITAL_COMMUNITY)
Admission: EM | Disposition: A | Discharge status: Home / Self Care | Payer: Self-pay | Source: Home / Self Care | Attending: Family Medicine

## 2023-11-20 ENCOUNTER — Inpatient Hospital Stay (HOSPITAL_COMMUNITY)
Admission: EM | Admit: 2023-11-20 | Discharge: 2023-11-22 | DRG: 378 | Disposition: A | Discharge status: Home / Self Care | Payer: Medicare Other | Attending: Family Medicine | Admitting: Family Medicine

## 2023-11-20 DIAGNOSIS — I482 Chronic atrial fibrillation, unspecified: Secondary | ICD-10-CM

## 2023-11-20 DIAGNOSIS — E1122 Type 2 diabetes mellitus with diabetic chronic kidney disease: Secondary | ICD-10-CM | POA: Diagnosis not present

## 2023-11-20 DIAGNOSIS — Z8249 Family history of ischemic heart disease and other diseases of the circulatory system: Secondary | ICD-10-CM

## 2023-11-20 DIAGNOSIS — E66811 Obesity, class 1: Secondary | ICD-10-CM | POA: Diagnosis not present

## 2023-11-20 DIAGNOSIS — E785 Hyperlipidemia, unspecified: Secondary | ICD-10-CM | POA: Diagnosis not present

## 2023-11-20 DIAGNOSIS — K5731 Diverticulosis of large intestine without perforation or abscess with bleeding: Secondary | ICD-10-CM | POA: Diagnosis not present

## 2023-11-20 DIAGNOSIS — I5032 Chronic diastolic (congestive) heart failure: Secondary | ICD-10-CM | POA: Diagnosis not present

## 2023-11-20 DIAGNOSIS — I2510 Atherosclerotic heart disease of native coronary artery without angina pectoris: Secondary | ICD-10-CM | POA: Diagnosis not present

## 2023-11-20 DIAGNOSIS — D6832 Hemorrhagic disorder due to extrinsic circulating anticoagulants: Secondary | ICD-10-CM | POA: Diagnosis not present

## 2023-11-20 DIAGNOSIS — E1169 Type 2 diabetes mellitus with other specified complication: Secondary | ICD-10-CM | POA: Diagnosis present

## 2023-11-20 DIAGNOSIS — D62 Acute posthemorrhagic anemia: Secondary | ICD-10-CM | POA: Diagnosis not present

## 2023-11-20 DIAGNOSIS — I130 Hypertensive heart and chronic kidney disease with heart failure and stage 1 through stage 4 chronic kidney disease, or unspecified chronic kidney disease: Secondary | ICD-10-CM | POA: Diagnosis not present

## 2023-11-20 DIAGNOSIS — K5790 Diverticulosis of intestine, part unspecified, without perforation or abscess without bleeding: Secondary | ICD-10-CM | POA: Diagnosis not present

## 2023-11-20 DIAGNOSIS — Z8042 Family history of malignant neoplasm of prostate: Secondary | ICD-10-CM

## 2023-11-20 DIAGNOSIS — M810 Age-related osteoporosis without current pathological fracture: Secondary | ICD-10-CM | POA: Diagnosis not present

## 2023-11-20 DIAGNOSIS — K921 Melena: Secondary | ICD-10-CM | POA: Diagnosis not present

## 2023-11-20 DIAGNOSIS — K219 Gastro-esophageal reflux disease without esophagitis: Secondary | ICD-10-CM | POA: Diagnosis present

## 2023-11-20 DIAGNOSIS — K922 Gastrointestinal hemorrhage, unspecified: Secondary | ICD-10-CM | POA: Diagnosis present

## 2023-11-20 DIAGNOSIS — K2100 Gastro-esophageal reflux disease with esophagitis, without bleeding: Secondary | ICD-10-CM | POA: Diagnosis present

## 2023-11-20 DIAGNOSIS — N1832 Chronic kidney disease, stage 3b: Secondary | ICD-10-CM | POA: Diagnosis not present

## 2023-11-20 DIAGNOSIS — Z7901 Long term (current) use of anticoagulants: Secondary | ICD-10-CM | POA: Diagnosis not present

## 2023-11-20 DIAGNOSIS — I10 Essential (primary) hypertension: Secondary | ICD-10-CM | POA: Diagnosis present

## 2023-11-20 DIAGNOSIS — E669 Obesity, unspecified: Secondary | ICD-10-CM | POA: Diagnosis present

## 2023-11-20 DIAGNOSIS — Z79899 Other long term (current) drug therapy: Secondary | ICD-10-CM

## 2023-11-20 DIAGNOSIS — Z91048 Other nonmedicinal substance allergy status: Secondary | ICD-10-CM | POA: Diagnosis not present

## 2023-11-20 DIAGNOSIS — Z885 Allergy status to narcotic agent status: Secondary | ICD-10-CM | POA: Diagnosis not present

## 2023-11-20 DIAGNOSIS — T80818A Extravasation of other vesicant agent, initial encounter: Secondary | ICD-10-CM | POA: Diagnosis present

## 2023-11-20 DIAGNOSIS — Q600 Renal agenesis, unilateral: Secondary | ICD-10-CM | POA: Diagnosis not present

## 2023-11-20 DIAGNOSIS — I4819 Other persistent atrial fibrillation: Secondary | ICD-10-CM | POA: Diagnosis present

## 2023-11-20 DIAGNOSIS — K5791 Diverticulosis of intestine, part unspecified, without perforation or abscess with bleeding: Principal | ICD-10-CM | POA: Diagnosis present

## 2023-11-20 DIAGNOSIS — K5730 Diverticulosis of large intestine without perforation or abscess without bleeding: Secondary | ICD-10-CM

## 2023-11-20 DIAGNOSIS — Z881 Allergy status to other antibiotic agents status: Secondary | ICD-10-CM

## 2023-11-20 DIAGNOSIS — Z85828 Personal history of other malignant neoplasm of skin: Secondary | ICD-10-CM | POA: Diagnosis not present

## 2023-11-20 DIAGNOSIS — Z83438 Family history of other disorder of lipoprotein metabolism and other lipidemia: Secondary | ICD-10-CM | POA: Diagnosis not present

## 2023-11-20 DIAGNOSIS — I7090 Unspecified atherosclerosis: Secondary | ICD-10-CM | POA: Diagnosis not present

## 2023-11-20 DIAGNOSIS — Z8701 Personal history of pneumonia (recurrent): Secondary | ICD-10-CM

## 2023-11-20 DIAGNOSIS — K449 Diaphragmatic hernia without obstruction or gangrene: Secondary | ICD-10-CM | POA: Diagnosis not present

## 2023-11-20 DIAGNOSIS — N183 Chronic kidney disease, stage 3 unspecified: Secondary | ICD-10-CM | POA: Diagnosis present

## 2023-11-20 DIAGNOSIS — Z888 Allergy status to other drugs, medicaments and biological substances status: Secondary | ICD-10-CM | POA: Diagnosis not present

## 2023-11-20 HISTORY — PX: FIDUCIAL MARKER PLACEMENT: SHX6858

## 2023-11-20 HISTORY — PX: FLEXIBLE SIGMOIDOSCOPY: SHX5431

## 2023-11-20 LAB — CBC
HCT: 33.7 % — ABNORMAL LOW (ref 36.0–46.0)
HCT: 29 % — ABNORMAL LOW (ref 36.0–46.0)
Hemoglobin: 10.5 g/dL — ABNORMAL LOW (ref 12.0–15.0)
Hemoglobin: 9.4 g/dL — ABNORMAL LOW (ref 12.0–15.0)
MCH: 29.9 pg (ref 26.0–34.0)
MCH: 30.5 pg (ref 26.0–34.0)
MCHC: 31.2 g/dL (ref 30.0–36.0)
MCHC: 32.4 g/dL (ref 30.0–36.0)
MCV: 96 fL (ref 80.0–100.0)
MCV: 94.2 fL (ref 80.0–100.0)
Platelets: 189 10*3/uL (ref 150–400)
Platelets: 179 10*3/uL (ref 150–400)
RBC: 3.51 MIL/uL — ABNORMAL LOW (ref 3.87–5.11)
RBC: 3.08 MIL/uL — ABNORMAL LOW (ref 3.87–5.11)
RDW: 15.2 % (ref 11.5–15.5)
RDW: 15.4 % (ref 11.5–15.5)
WBC: 5.7 10*3/uL (ref 4.0–10.5)
WBC: 6.9 10*3/uL (ref 4.0–10.5)
nRBC: 0 % (ref 0.0–0.2)
nRBC: 0 % (ref 0.0–0.2)

## 2023-11-20 LAB — COMPREHENSIVE METABOLIC PANEL
ALT: 19 U/L (ref 0–44)
AST: 23 U/L (ref 15–41)
Albumin: 3.9 g/dL (ref 3.5–5.0)
Alkaline Phosphatase: 97 U/L (ref 38–126)
Anion gap: 11 (ref 5–15)
BUN: 25 mg/dL — ABNORMAL HIGH (ref 8–23)
CO2: 23 mmol/L (ref 22–32)
Calcium: 9.7 mg/dL (ref 8.9–10.3)
Chloride: 105 mmol/L (ref 98–111)
Creatinine, Ser: 1.33 mg/dL — ABNORMAL HIGH (ref 0.44–1.00)
GFR, Estimated: 39 mL/min — ABNORMAL LOW (ref >60)
Glucose, Bld: 204 mg/dL — ABNORMAL HIGH (ref 70–99)
Potassium: 4.1 mmol/L (ref 3.5–5.1)
Sodium: 139 mmol/L (ref 135–145)
Total Bilirubin: 0.9 mg/dL (ref 0.0–1.2)
Total Protein: 6.2 g/dL — ABNORMAL LOW (ref 6.5–8.1)

## 2023-11-20 LAB — CBC WITH DIFFERENTIAL/PLATELET
Abs Immature Granulocytes: 0.05 10*3/uL (ref 0.00–0.07)
Basophils Absolute: 0 10*3/uL (ref 0.0–0.1)
Basophils Relative: 1 %
Eosinophils Absolute: 0.2 10*3/uL (ref 0.0–0.5)
Eosinophils Relative: 3 %
HCT: 39.9 % (ref 36.0–46.0)
Hemoglobin: 12.5 g/dL (ref 12.0–15.0)
Immature Granulocytes: 1 %
Lymphocytes Relative: 13 %
Lymphs Abs: 1 10*3/uL (ref 0.7–4.0)
MCH: 29.9 pg (ref 26.0–34.0)
MCHC: 31.3 g/dL (ref 30.0–36.0)
MCV: 95.5 fL (ref 80.0–100.0)
Monocytes Absolute: 0.7 10*3/uL (ref 0.1–1.0)
Monocytes Relative: 10 %
Neutro Abs: 5.4 10*3/uL (ref 1.7–7.7)
Neutrophils Relative %: 72 %
Platelets: 207 10*3/uL (ref 150–400)
RBC: 4.18 MIL/uL (ref 3.87–5.11)
RDW: 15.1 % (ref 11.5–15.5)
WBC: 7.4 10*3/uL (ref 4.0–10.5)
nRBC: 0 % (ref 0.0–0.2)

## 2023-11-20 LAB — URINALYSIS, ROUTINE W REFLEX MICROSCOPIC
Bilirubin Urine: NEGATIVE
Glucose, UA: NEGATIVE mg/dL
Ketones, ur: NEGATIVE mg/dL
Nitrite: NEGATIVE
Protein, ur: 30 mg/dL — AB
Specific Gravity, Urine: 1.016 (ref 1.005–1.030)
WBC, UA: >50 WBC/hpf (ref 0–5)
pH: 5 (ref 5.0–8.0)

## 2023-11-20 LAB — LIPASE, BLOOD: Lipase: 25 U/L (ref 11–51)

## 2023-11-20 LAB — APTT: aPTT: 35 s (ref 24–36)

## 2023-11-20 LAB — GLUCOSE, CAPILLARY: Glucose-Capillary: 150 mg/dL — ABNORMAL HIGH (ref 70–99)

## 2023-11-20 LAB — PROTIME-INR
INR: 1.6 — ABNORMAL HIGH (ref 0.8–1.2)
Prothrombin Time: 19.6 s — ABNORMAL HIGH (ref 11.4–15.2)

## 2023-11-20 SURGERY — SIGMOIDOSCOPY, FLEXIBLE
Anesthesia: Monitor Anesthesia Care

## 2023-11-20 MED ORDER — ONDANSETRON HCL 4 MG/2ML IJ SOLN: 4.0000 mg | Freq: Once | INTRAMUSCULAR | Status: DC | PRN

## 2023-11-20 MED ORDER — CARBOXYMETH-GLYCERIN-POLYSORB 0.5-1-0.5 % OP SOLN: Freq: Every day | OPHTHALMIC | Status: DC | PRN

## 2023-11-20 MED ORDER — ACETAMINOPHEN 650 MG RE SUPP: 650.0000 mg | Freq: Four times a day (QID) | RECTAL | Status: DC | PRN

## 2023-11-20 MED ORDER — AMIODARONE HCL 200 MG PO TABS
200.0000 mg | ORAL_TABLET | Freq: Every day | ORAL | Status: DC
  Administered 2023-11-21 – 2023-11-22 (×2): 200 mg via ORAL
  Filled 2023-11-20 (×2): qty 1

## 2023-11-20 MED ORDER — POLYVINYL ALCOHOL 1.4 % OP SOLN: 1.0000 [drp] | OPHTHALMIC | Status: DC | PRN

## 2023-11-20 MED ORDER — FENTANYL CITRATE (PF) 100 MCG/2ML IJ SOLN
25.0000 ug | INTRAMUSCULAR | Status: DC | PRN
Start: 2023-11-20 — End: 2023-11-20

## 2023-11-20 MED ORDER — ACETAMINOPHEN 325 MG PO TABS
650.0000 mg | ORAL_TABLET | Freq: Four times a day (QID) | ORAL | Status: DC | PRN
  Administered 2023-11-21: 650 mg via ORAL
  Filled 2023-11-20: qty 2

## 2023-11-20 MED ORDER — SODIUM CHLORIDE 0.9 % IV SOLN: INTRAVENOUS | Status: DC | PRN

## 2023-11-20 MED ORDER — SODIUM CHLORIDE 0.9% FLUSH
3.0000 mL | Freq: Two times a day (BID) | INTRAVENOUS | Status: DC
  Administered 2023-11-20 – 2023-11-22 (×4): 3 mL via INTRAVENOUS

## 2023-11-20 MED ORDER — PROPOFOL 500 MG/50ML IV EMUL
INTRAVENOUS | Status: DC | PRN
  Administered 2023-11-20: 100 ug/kg/min via INTRAVENOUS

## 2023-11-20 MED ORDER — IOHEXOL 350 MG/ML SOLN
75.0000 mL | Freq: Once | INTRAVENOUS | Status: AC | PRN
  Administered 2023-11-20: 75 mL via INTRAVENOUS

## 2023-11-20 NOTE — Op Note (Signed)
 [facility] Patient Name: Briana [name] Procedure Date : 11/20/2023 MRN: [id number] Attending MD: Belvie Just , MD, [id number] Date of Birth: 21-Mar-1934 CSN: [id number] Age: 89 Admit Type: Outpatient Procedure:                Flexible Sigmoidoscopy Indications:              Hematochezia Providers:                Belvie Just, MD, Collene Edu, RN, Felice Sar,                            Technician Referring MD:              Medicines:                Propofol  per Anesthesia Complications:            No immediate complications. Estimated Blood Loss:     Estimated blood loss: none. Procedure:                Pre-Anesthesia Assessment:                           - Prior to the procedure, a History and Physical                            was performed, and patient medications and                            allergies were reviewed. The patient's tolerance of                            previous anesthesia was also reviewed. The risks                            and benefits of the procedure and the sedation                            options and risks were discussed with the patient.                            All questions were answered, and informed consent                            was obtained. Prior Anticoagulants: The patient has                            taken Eliquis  (apixaban ), last dose was day of                            procedure. ASA Grade Assessment: III - A patient                            with severe systemic disease. After reviewing the  risks and benefits, the patient was deemed in                            satisfactory condition to undergo the procedure.                           - Sedation was administered by an anesthesia                            professional. Deep sedation was attained.                           After obtaining informed consent, the scope was                            passed under direct vision. The [id number]-[id number]  ([id number])                            Olympus endoscope was introduced through the anus                            and advanced to the the sigmoid colon. The flexible                            sigmoidoscopy was technically difficult and complex                            due to poor bowel prep. Successful completion of                            the procedure was aided by lavage. The quality of                            the bowel preparation was adequate. Scope In: 5:15:10 PM Scope Out: 5:43:16 PM Total Procedure Duration: 0 hours 28 minutes 6 seconds  Findings:      Scattered large-mouthed, medium-mouthed and small-mouthed diverticula       were found in the sigmoid colon. For location marking, one hemostatic       clip was successfully placed (MR safe). Clip manufacturer: [employer]. There was no bleeding at the end of the procedure.      There was a significant amount of retained solid stool. Visualization       was difficult. The endoscope was able to be advanced proximal and a       large fresh clot was identified. The clot was able to be removed using a       cold snare. A hemoclip was placed in this region as it is believed to       correlate with the CTA findings. No bleeding was precipitated with the       removal of the clot. There was no specific diverticulum that was       associated with the clot. Two complete passes were made in the sigmoid       colon and there was no evidence of any rebleeding. Impression:               -  Diverticulosis in the sigmoid colon. Clip                            manufacturer: [employer]. Clip (MR safe) was                            placed.                           - No specimens collected. Recommendation:           - Return patient to hospital ward for ongoing care.                           - Clear liquid diet.                           - Follow HGB and transfuse as necessary.                           - If the bleeding  persists and there is a high need                            for transfusions, consult IR for possible                            embolization. Procedure Code(s):        --- Professional ---                           [phone], Sigmoidoscopy, flexible; diagnostic,                            including collection of specimen(s) by brushing or                            washing, when performed (separate procedure)                           [phone], Unlisted procedure, colon Diagnosis Code(s):        --- Professional ---                           K92.1, Melena (includes Hematochezia)                           K57.30, Diverticulosis of large intestine without                            perforation or abscess without bleeding CPT copyright 2022 [employer] Association. All rights reserved. The codes documented in this report are preliminary and upon coder review may  be revised to meet current compliance requirements. Belvie Just, MD Belvie Just, MD 11/20/2023 5:52:15 PM This report has been signed electronically. Number of Addenda: 0

## 2023-11-20 NOTE — ED Provider Notes (Signed)
 [place] EMERGENCY DEPARTMENT AT [facility] Provider Note   CSN: [id number] Arrival date & time: 11/20/23  0745     History  Chief Complaint  Patient presents with   Melena    Briana [name] is a 89 y.o. female with past medical history seen for hypertension, diabetes, A-fib who takes Xarelto  who presents with concern for rectal bleeding with large clots from rectum beginning this morning around 4:30 AM.  She reports some mild abdominal pain.  She reports that she has had diverticulitis previously, no other history of rectal bleeding.  She denies any nausea, vomiting, diarrhea.  HPI     Home Medications Prior to Admission medications   Medication Sig Start Date End Date Taking? Authorizing Provider  acetaminophen  (TYLENOL ) 500 MG tablet Take 500 mg by mouth every 6 (six) hours as needed for moderate pain.    [provider]  albuterol  (PROAIR  HFA) 108 (90 Base) MCG/ACT inhaler Take  2 inhalations  15 minutes apart  every 4 hours  as needed for Wheezing 03/09/22   Tonita Fallow, MD  amiodarone  (PACERONE ) 200 MG tablet Take 1 tablet by mouth once daily 10/04/23   Burnard Debby LABOR, MD  Ascorbic Acid  (VITAMIN C  PO) Take 1 tablet by mouth at bedtime.    [provider]  Boswellia-Glucosamine-Vit D (OSTEO BI-FLEX ONE PER DAY PO) Take 1 tablet by mouth in the morning.    [provider]  Carboxymeth-Glycerin -Polysorb (REFRESH DIGITAL OP) Place 1 drop into the right eye daily as needed (dry eyes).    [provider]  cholecalciferol  (VITAMIN D3) 25 MCG (1000 UNIT) tablet Take 3,000 Units by mouth daily.    [provider]  Cyanocobalamin  (B-12 PO) Take 1 capsule by mouth daily.    [provider]  diazepam  (VALIUM ) 2 MG tablet Take 1 tablet 3 - 4 x /day for Tremor                                     /                                 TAKE                    BY                     MOUTH Patient taking differently: Take 2 mg by  mouth as needed (Tremors). 06/20/22   Tonita Fallow, MD  diltiazem  (CARDIZEM  CD) 180 MG 24 hr capsule Take 1 capsule  Daily for Heart & BP                                                                                                   /                          TAKE  BY                                            MOUTH                                                   ONCE ? DAILY Patient taking differently: Take 180 mg by mouth in the morning. 02/15/23   Tonita Fallow, MD  Ferrous Sulfate (SLOW RELEASE IRON PO) Take 1 tablet by mouth at bedtime.    [provider]  furosemide  (LASIX ) 40 MG tablet Take 1 tablet (40 mg total) by mouth daily. 06/14/23 06/13/24  Wilkinson, Dana E, NP  loratadine (CLARITIN) 10 MG tablet Take 10 mg by mouth daily as needed for allergies.    [provider]  Multiple Vitamins-Minerals (ZINC PO) Take 1 capsule by mouth daily.    [provider]  polyethylene glycol (MIRALAX  / GLYCOLAX ) 17 g packet Take 17 g by mouth daily. 03/11/21   Akula, Vijaya, MD  Pyridoxine HCl (B-6 PO) Take 1 tablet by mouth daily.    [provider]  Simethicone (GAS-X PO) Take 1 tablet by mouth daily as needed (gas).    [provider]  XARELTO  15 MG TABS tablet TAKE 1 TABLET BY MOUTH ONCE DAILY WITH SUPPER **D/C  ELIQUIS ** 11/08/23   Fenton, Clint R, PA      Allergies    Codeine, Accupril [quinapril hcl], Prednisone , Reglan [metoclopramide], Tramadol , Adhesive [tape], and Neosporin [neomycin-bacitracin zn-polymyx]    Review of Systems   Review of Systems  All other systems reviewed and are negative.   Physical Exam Updated Vital Signs BP 132/69 (BP Location: Right Arm)   Pulse (!) 58   Temp 98 F (36.7 C) (Oral)   Resp 14   Ht 5' (1.524 m)   Wt 58.1 kg   SpO2 100%   BMI 25.00 kg/m  Physical Exam Vitals and nursing note reviewed.  Constitutional:      General: She is not in acute distress.     Appearance: Normal appearance.  HENT:     Head: Normocephalic and atraumatic.  Eyes:     General:        Right eye: No discharge.        Left eye: No discharge.  Cardiovascular:     Rate and Rhythm: Normal rate and regular rhythm.     Heart sounds: No murmur heard.    No friction rub. No gallop.  Pulmonary:     Effort: Pulmonary effort is normal.     Breath sounds: Normal breath sounds.  Abdominal:     General: Bowel sounds are normal.     Palpations: Abdomen is soft.     Comments: Diffuse tenderness to palpation throughout the abdomen, no rebound, rigidity, guarding  Genitourinary:    Comments: Some dark blood and clots noted on rectal exam, no large-volume GI bleeding on visual examination, but nurse reports that on bowel movement just prior to arrival in the room that she did have a large amount of both bright red blood and clotting. Skin:    General: Skin is warm and dry.     Capillary Refill: Capillary refill takes less than 2 seconds.  Neurological:  Mental Status: She is alert and oriented to person, place, and time.  Psychiatric:        Mood and Affect: Mood normal.        Behavior: Behavior normal.     ED Results / Procedures / Treatments   Labs (all labs ordered are listed, but only abnormal results are displayed) Labs Reviewed  COMPREHENSIVE METABOLIC PANEL - Abnormal; Notable for the following components:      Result Value   Glucose, Bld 204 (*)    BUN 25 (*)    Creatinine, Ser 1.33 (*)    Total Protein 6.2 (*)    GFR, Estimated 39 (*)    All other components within normal limits  CBC WITH DIFFERENTIAL/PLATELET  LIPASE, BLOOD  URINALYSIS, ROUTINE W REFLEX MICROSCOPIC  TYPE AND SCREEN    EKG None  Radiology No results found.  Procedures Procedures    Medications Ordered in ED Medications - No data to display  ED Course/ Medical Decision Making/ A&P                                 Medical Decision Making Amount and/or Complexity of Data  Reviewed Radiology: ordered.  Risk Prescription drug management. Decision regarding hospitalization.   This patient is a 89 y.o. female  who presents to the ED for concern of abdominal pain, rectal bleeding.   Differential diagnoses prior to evaluation: The emergent differential diagnosis includes, but is not limited to,  The causes of generalized abdominal pain include but are not limited to AAA, mesenteric ischemia, appendicitis, diverticulitis, DKA, gastritis, gastroenteritis, AMI, nephrolithiasis, pancreatitis, peritonitis, adrenal insufficiency,lead poisoning, iron toxicity, intestinal ischemia, constipation, UTI,SBO/LBO, splenic rupture, biliary disease, IBD, IBS, PUD, or hepatitis, considered acute diverticular bleed, versus other upper or lower GI bleed. This is not an exhaustive differential.   Past Medical History / Co-morbidities / Social History:  hypertension, diabetes, A-fib  Additional history: Chart reviewed. Pertinent results include: Reviewed previous PCP visits, lab work, imaging from previous emergency department visits, cardiology visits  Physical Exam: Physical exam performed. The pertinent findings include: Patient has been normotensive in the ED, even somewhat hypertensive on arrival, BP 151/96.  She does have some bright red blood in rectal vault as well as some darker clots.  She is not having active rectal bleeding between bowel movements or between rectal exam.  Lab Tests/Imaging studies: I personally interpreted labs/imaging and the pertinent results include: CBC unremarkable, UA suspicious for possible early urinary tract infection with moderate hemoglobin, large leukocytes, greater than 50 white blood cells and few bacteria, she is not having any dysuria, her primary problem is her active GI bleed, I will let the hospital team manage possible UTI as her presentation today was not infectious.  CMP notable for mildly elevated BUN, creatinine which could be  secondary to breakdown of blood products secondary to her active GI bleed..  I independently interpreted CT angio GI bleed which shows active diverticular bleed I agree with the radiologist interpretation.  Medications: Considered Xarelto  reversal but given she last took it last night around 9 PM half-life of Xarelto  5 to 9 hours we will hold off on this time as with her stable vital signs she does not meet indication for receiving the reversal medication.  Consults: Spoke with the hospitalist, Dr. Seena who agrees to admit this patient, page pending for GI to help manage her GI bleed.   Disposition: After consideration  of the diagnostic results and the patients response to treatment, I feel that patient would benefit from hospital admission for active lower GI bleed on blood thinner.   Final Clinical Impression(s) / ED Diagnoses Final diagnoses:  None    Rx / DC Orders ED Discharge Orders     None         Conny Moening H, PA-C 11/21/23 [id number]    Patsey Lot, MD 11/21/23 1501

## 2023-11-20 NOTE — Consult Note (Signed)
 Reason for Consult: Diverticular bleed Referring Physician: [facility]  Briana [name] HPI: This is an 89 year old female with a PMH of sigmoid colon diverticular bleed, HTN, DM, GERD, diastolic CHF, and atrial fibrillation admitted today for a diverticular bleed.  She reported the onset of her symptoms around 4:30 AM.  At that time she had the urge to have a bowel movement, but she only passed blood.  This prompted her to present to the ER and she had continued bleeding.  The patient does take Eliquis .  Her CTA today showed a mid sigmoid colon diverticular bleed.  On 03/09/2021 Dr. Shila performed an FFS and she identified a diverticulum with an adherent clot.  This area was clipped with two hemoclips.  Past Medical History:  Diagnosis Date   Acute on chronic diastolic (congestive) heart failure (HCC) 11/21/2018   Cancer (HCC)    skin cancer   DDD (degenerative disc disease)    Diverticula of colon    Diverticulitis    DJD (degenerative joint disease)    GERD (gastroesophageal reflux disease)    takes ranitidine   Heart murmur    History of hiatal hernia    History of pneumonia    Hx of irritable bowel syndrome    Hyperlipidemia    Hypertension    Occasional tremors    Osteoporosis    Pre-diabetes    Varicose veins    Vitamin D  deficiency     Past Surgical History:  Procedure Laterality Date   APPENDECTOMY     BREAST EXCISIONAL BIOPSY Right    BREAST SURGERY Right    fluid drained from breast   CARDIOVERSION N/A 12/20/2018   Procedure: CARDIOVERSION;  Surgeon: Lonni Slain, MD;  Location: [facility] ENDOSCOPY;  Service: Cardiovascular;  Laterality: N/A;   CARDIOVERSION N/A 06/16/2019   Procedure: CARDIOVERSION;  Surgeon: Okey Vina GAILS, MD;  Location: [facility] ENDOSCOPY;  Service: Cardiovascular;  Laterality: N/A;   CARDIOVERSION N/A 05/17/2023   Procedure: CARDIOVERSION;  Surgeon: Okey Vina GAILS, MD;  Location: [facility] INVASIVE CV LAB;  Service: Cardiovascular;  Laterality: N/A;    COLONOSCOPY     ESOPHAGOGASTRODUODENOSCOPY     FLEXIBLE SIGMOIDOSCOPY N/A 03/09/2021   Procedure: FLEXIBLE SIGMOIDOSCOPY;  Surgeon: Shila Gustav GAILS, MD;  Location: [facility] ENDOSCOPY;  Service: Endoscopy;  Laterality: N/A;   HEMOSTASIS CLIP PLACEMENT  03/09/2021   Procedure: HEMOSTASIS CLIP PLACEMENT;  Surgeon: Shila Gustav GAILS, MD;  Location: [facility] ENDOSCOPY;  Service: Endoscopy;;   JOINT REPLACEMENT Left    left hip   LUMBAR LAMINECTOMY/DECOMPRESSION MICRODISCECTOMY N/A 06/05/2015   Procedure: L3-L5 DECOMPRESSION ;  Surgeon: Donaciano Sprang, MD;  Location: [facility] OR;  Service: Orthopedics;  Laterality: N/A;   OPEN REDUCTION INTERNAL FIXATION (ORIF) DISTAL RADIAL FRACTURE Right 01/10/2014   Procedure: OPEN REDUCTION INTERNAL FIXATION (ORIF) DISTAL RADIAL FRACTURE;  Surgeon: Prentice LELON Pagan, MD;  Location: [facility] OR;  Service: Orthopedics;  Laterality: Right;   SPINAL CORD DECOMPRESSION  06/05/2015   L3 L 5   THYROID  SURGERY     TONSILLECTOMY     TUBAL LIGATION     varicose veins stripped     WRIST FRACTURE SURGERY Bilateral     Family History  Problem Relation Age of Onset   Hypertension Mother    Hyperlipidemia Mother    Heart disease Sister    Cancer Brother        prostate   Colon cancer Neg Hx    Esophageal cancer Neg Hx    Pancreatic cancer Neg  Hx    Stomach cancer Neg Hx    Liver disease Neg Hx    Breast cancer Neg Hx     Social History:  reports that she has never smoked. She has never used smokeless tobacco. She reports that she does not drink alcohol  and does not use drugs.  Allergies:  Allergies  Allergen Reactions   Codeine Rash   Accupril [Quinapril Hcl] Cough   Prednisone      Agitated and shaky   Reglan [Metoclopramide] Other (See Comments)    tremors   Tramadol  Nausea And Vomiting   Adhesive [Tape] Rash   Neosporin [Neomycin-Bacitracin Zn-Polymyx] Itching and Rash    Medications: Scheduled:  [MAR Hold] amiodarone   200 mg Oral Daily   [MAR Hold] sodium chloride   flush  3 mL Intravenous Q12H   Continuous:  Results for orders placed or performed during the hospital encounter of 11/20/23 (from the past 24 hours)  CBC with Differential     Status: None   Collection Time: 11/20/23  8:10 AM  Result Value Ref Range   WBC 7.4 4.0 - 10.5 K/uL   RBC 4.18 3.87 - 5.11 MIL/uL   Hemoglobin 12.5 12.0 - 15.0 g/dL   HCT 60.0 63.9 - 53.9 %   MCV 95.5 80.0 - 100.0 fL   MCH 29.9 26.0 - 34.0 pg   MCHC 31.3 30.0 - 36.0 g/dL   RDW 84.8 88.4 - 84.4 %   Platelets 207 150 - 400 K/uL   nRBC 0.0 0.0 - 0.2 %   Neutrophils Relative % 72 %   Neutro Abs 5.4 1.7 - 7.7 K/uL   Lymphocytes Relative 13 %   Lymphs Abs 1.0 0.7 - 4.0 K/uL   Monocytes Relative 10 %   Monocytes Absolute 0.7 0.1 - 1.0 K/uL   Eosinophils Relative 3 %   Eosinophils Absolute 0.2 0.0 - 0.5 K/uL   Basophils Relative 1 %   Basophils Absolute 0.0 0.0 - 0.1 K/uL   Immature Granulocytes 1 %   Abs Immature Granulocytes 0.05 0.00 - 0.07 K/uL  Comprehensive metabolic panel     Status: Abnormal   Collection Time: 11/20/23  8:10 AM  Result Value Ref Range   Sodium 139 135 - 145 mmol/L   Potassium 4.1 3.5 - 5.1 mmol/L   Chloride 105 98 - 111 mmol/L   CO2 23 22 - 32 mmol/L   Glucose, Bld 204 (H) 70 - 99 mg/dL   BUN 25 (H) 8 - 23 mg/dL   Creatinine, Ser 8.66 (H) 0.44 - 1.00 mg/dL   Calcium 9.7 8.9 - 89.6 mg/dL   Total Protein 6.2 (L) 6.5 - 8.1 g/dL   Albumin  3.9 3.5 - 5.0 g/dL   AST 23 15 - 41 U/L   ALT 19 0 - 44 U/L   Alkaline Phosphatase 97 38 - 126 U/L   Total Bilirubin 0.9 0.0 - 1.2 mg/dL   GFR, Estimated 39 (L) >60 mL/min   Anion gap 11 5 - 15  Lipase, blood     Status: None   Collection Time: 11/20/23  8:10 AM  Result Value Ref Range   Lipase 25 11 - 51 U/L  Type and screen [facility]     Status: None (Preliminary result)   Collection Time: 11/20/23  8:10 AM  Result Value Ref Range   ABO/RH(D) O POS    Antibody Screen POS    Sample Expiration 11/23/2023,2359     Antibody Identification ANTI JKA (Kidd a)  Unit Number [id number]    Blood Component Type RBC LR PHER1    Unit division 00    Status of Unit ALLOCATED    Transfusion Status OK TO TRANSFUSE    Crossmatch Result      COMPATIBLE Performed at [facility], [address] [place], [place] [address]    Unit Number [id number]    Blood Component Type RBC LR PHER2    Unit division 00    Status of Unit ALLOCATED    Transfusion Status OK TO TRANSFUSE    Crossmatch Result COMPATIBLE   Urinalysis, Routine w reflex microscopic -Urine, Clean Catch     Status: Abnormal   Collection Time: 11/20/23 11:33 AM  Result Value Ref Range   Color, Urine YELLOW YELLOW   APPearance CLOUDY (A) CLEAR   Specific Gravity, Urine 1.016 1.005 - 1.030   pH 5.0 5.0 - 8.0   Glucose, UA NEGATIVE NEGATIVE mg/dL   Hgb urine dipstick MODERATE (A) NEGATIVE   Bilirubin Urine NEGATIVE NEGATIVE   Ketones, ur NEGATIVE NEGATIVE mg/dL   Protein, ur 30 (A) NEGATIVE mg/dL   Nitrite NEGATIVE NEGATIVE   Leukocytes,Ua LARGE (A) NEGATIVE   RBC / HPF 21-50 0 - 5 RBC/hpf   WBC, UA >50 0 - 5 WBC/hpf   Bacteria, UA FEW (A) NONE SEEN   Squamous Epithelial / HPF 0-5 0 - 5 /HPF   WBC Clumps PRESENT    Mucus PRESENT    Non Squamous Epithelial 0-5 (A) NONE SEEN  APTT     Status: None   Collection Time: 11/20/23  2:35 PM  Result Value Ref Range   aPTT 35 24 - 36 seconds  Protime-INR     Status: Abnormal   Collection Time: 11/20/23  2:35 PM  Result Value Ref Range   Prothrombin Time 19.6 (H) 11.4 - 15.2 seconds   INR 1.6 (H) 0.8 - 1.2  CBC     Status: Abnormal   Collection Time: 11/20/23  2:35 PM  Result Value Ref Range   WBC 5.7 4.0 - 10.5 K/uL   RBC 3.51 (L) 3.87 - 5.11 MIL/uL   Hemoglobin 10.5 (L) 12.0 - 15.0 g/dL   HCT 66.2 (L) 63.9 - 53.9 %   MCV 96.0 80.0 - 100.0 fL   MCH 29.9 26.0 - 34.0 pg   MCHC 31.2 30.0 - 36.0 g/dL   RDW 84.7 88.4 - 84.4 %   Platelets 189 150 - 400 K/uL   nRBC 0.0 0.0 - 0.2 %      CT ANGIO GI BLEED Result Date: 11/20/2023 CLINICAL DATA:  Abdominal pain, rectal bleeding EXAM: CTA ABDOMEN AND PELVIS WITHOUT AND WITH CONTRAST TECHNIQUE: Multidetector CT imaging of the abdomen and pelvis was performed using the standard protocol during bolus administration of intravenous contrast. Multiplanar reconstructed images and MIPs were obtained and reviewed to evaluate the vascular anatomy. RADIATION DOSE REDUCTION: This exam was performed according to the departmental dose-optimization program which includes automated exposure control, adjustment of the mA and/or kV according to patient size and/or use of iterative reconstruction technique. CONTRAST:  75mL OMNIPAQUE  IOHEXOL  350 MG/ML SOLN COMPARISON:  09/15/2022 FINDINGS: VASCULAR Normal contour and caliber of the abdominal aorta. No evidence of aneurysm, dissection, or other acute aortic pathology. Standard branching pattern of the abdominal aorta with solitary bilateral renal arteries. Moderate mixed calcific atherosclerosis. Review of the MIP images confirms the above findings. NON-VASCULAR Lower Chest: No acute findings. Moderate hiatal hernia with intrathoracic position of the gastric  fundus. Cardiomegaly. Hepatobiliary: No solid liver abnormality is seen. No gallstones, gallbladder wall thickening, or biliary dilatation. Pancreas: Unremarkable. No pancreatic ductal dilatation or surrounding inflammatory changes. Spleen: Normal in size without significant abnormality. Adrenals/Urinary Tract: Benign, macroscopic fat containing left adrenal adenoma, for which no further follow-up or characterization is required. Multiple simple, benign bilateral renal cortical cysts, including a large cyst arising from the inferior pole of the left kidney, requiring no specific further follow-up or characterization. Kidneys are otherwise normal, without renal calculi, solid lesion, or hydronephrosis. Bladder is unremarkable. Stomach/Bowel: Stomach is within normal  limits. Appendix appears normal. No evidence of bowel wall thickening, distention, or inflammatory changes. Descending and sigmoid diverticulosis without evidence of acute diverticulitis. Arterial contrast extravasation arising from a prominent diverticulum in the mid sigmoid (series 8, image 136). Lymphatic: No enlarged abdominal or pelvic lymph nodes. Reproductive: No mass or other significant abnormality. Other: Right inguinal hernia containing nonobstructed distal small bowel (series 8, image 172). No ascites. Musculoskeletal: No acute osseous findings. Status post left hip total arthroplasty. IMPRESSION: 1. Arterial contrast extravasation arising from a prominent diverticulum in the mid sigmoid, consistent with active diverticular bleeding. 2. Descending and sigmoid diverticulosis without evidence of acute diverticulitis. 3. Normal contour and caliber of the abdominal aorta. No evidence of aneurysm, dissection, or other acute aortic pathology. Moderate mixed calcific atherosclerosis. 4. Right inguinal hernia containing nonobstructed distal small bowel. Electronically Signed   By: Marolyn JONETTA Jaksch M.D.   On: 11/20/2023 12:49    ROS:  As stated above in the HPI otherwise negative.  Blood pressure (!) 159/77, pulse 68, temperature 97.7 F (36.5 C), temperature source Temporal, resp. rate 17, height 5' (1.524 m), weight 57.9 kg, SpO2 98%.    PE: Gen: NAD, Alert and Oriented HEENT:  [place]/AT, EOMI Neck: Supple, no LAD Lungs: CTA Bilaterally CV: RRR without M/G/R ABD: Soft, NTND, +BS Ext: No C/C/E  Assessment/Plan: 1) Diverticular bleed. 2) Anemia. 3) Afib on Eliquis .   With the positive CTA and the prior positive finding on the FFS, a repeat FFS will be performed emergently.  Plan: 1) FFS now.  Briana [name] D 11/20/2023, 5:03 PM

## 2023-11-20 NOTE — Anesthesia Postprocedure Evaluation (Signed)
 Anesthesia Post Note  Patient: Briana [name]  Procedure(s) Performed: FLEXIBLE SIGMOIDOSCOPY CLIP MARKER PLACEMENT     Patient location during evaluation: PACU Anesthesia Type: MAC Level of consciousness: awake and alert Pain management: pain level controlled Vital Signs Assessment: post-procedure vital signs reviewed and stable Respiratory status: spontaneous breathing, nonlabored ventilation and respiratory function stable Cardiovascular status: stable and blood pressure returned to baseline Postop Assessment: no apparent nausea or vomiting Anesthetic complications: no   No notable events documented.  Last Vitals:  Vitals:   11/20/23 1800 11/20/23 1815  BP: (!) 111/40 127/79  Pulse: 65 63  Resp: 16 19  Temp:  36.7 C  SpO2: 98% 97%    Last Pain:  Vitals:   11/20/23 1815  TempSrc:   PainSc: 0-No pain                 Garnette FORBES Skillern

## 2023-11-20 NOTE — ED Triage Notes (Signed)
 Pt arrived POV from home stating she woke up to use the restroom this morning at 430am and she started passing blood clots. Pt states she thinks it is bleeding from her rectum.

## 2023-11-20 NOTE — Anesthesia Preprocedure Evaluation (Addendum)
 Anesthesia Evaluation  Patient identified by MRN, date of birth, ID band Patient awake    Reviewed: Allergy & Precautions, NPO status , Patient's Chart, lab work & pertinent test results  Airway Mallampati: II  TM Distance: >3 FB Neck ROM: Full    Dental  (+) Teeth Intact, Dental Advisory Given   Pulmonary neg pulmonary ROS   Pulmonary exam normal breath sounds clear to auscultation       Cardiovascular hypertension, Pt. on medications +CHF  Normal cardiovascular exam+ dysrhythmias Atrial Fibrillation  Rhythm:Regular Rate:Normal  Echo 04/08/23:  1. Atrial flutter controlled rate      . Left ventricular ejection fraction, by estimation, is 60 to 65%. The  left ventricle has normal function. The left ventricle has no regional  wall motion abnormalities. There is mild concentric left ventricular  hypertrophy. Left ventricular diastolic   parameters are indeterminate.   2. Right ventricular systolic function is mildly reduced. The right  ventricular size is normal. There is normal pulmonary artery systolic  pressure.   3. Left atrial size was severely dilated.   4. The mitral valve is normal in structure. Mild mitral valve  regurgitation. No evidence of mitral stenosis.   5. The aortic valve is tricuspid. There is mild thickening of the aortic  valve. Aortic valve regurgitation is mild. No aortic stenosis is present.   6. Aortic Normal DTA.   7. The inferior vena cava is normal in size with greater than 50%  respiratory variability, suggesting right atrial pressure of 3 mmHg.     Neuro/Psych negative neurological ROS     GI/Hepatic Neg liver ROS, hiatal hernia,GERD  Medicated,,Diverticular bleed   Endo/Other  diabetes, Type 2    Renal/GU Renal InsufficiencyRenal disease     Musculoskeletal  (+) Arthritis ,    Abdominal   Peds  Hematology  (+) Blood dyscrasia (Xarelto ), anemia   Anesthesia Other Findings    Reproductive/Obstetrics                             Anesthesia Physical Anesthesia Plan  ASA: 3 and emergent  Anesthesia Plan: MAC   Post-op Pain Management: Minimal or no pain anticipated   Induction: Intravenous  PONV Risk Score and Plan: 2 and TIVA  Airway Management Planned: Natural Airway and Simple Face Mask  Additional Equipment:   Intra-op Plan:   Post-operative Plan:   Informed Consent: I have reviewed the patients History and Physical, chart, labs and discussed the procedure including the risks, benefits and alternatives for the proposed anesthesia with the patient or authorized representative who has indicated his/her understanding and acceptance.     Dental advisory given  Plan Discussed with: CRNA  Anesthesia Plan Comments:         Anesthesia Quick Evaluation

## 2023-11-20 NOTE — H&P (Signed)
 History and Physical   Briana [name] FMW:[id number] DOB: 04/02/1934 DOA: 11/20/2023  PCP: Tonita Fallow, MD    Patient coming from: Home  Chief Complaint: Rectal bleeding  HPI: Briana [name] is a 89 y.o. female with medical history significant of hypertension, diabetes, GERD, CKD 3, atrial fibrillation, chronic diastolic CHF, obesity, diverticulosis presenting with rectal bleeding.  Sepsis started around 430 this morning when she woke up and had urge to go to the bathroom she had significant red blood in the bowl with large clots.  She presented to the ED for further evaluation.  While there she has had 2 further large bloody bowel movements.  Also reporting some weakness and mild abdominal pain.  No prior history of GI bleeding does have history of diverticulitis.  He is currently on Xarelto  for atrial fibrillation but last dose was 9 PM last night.  Patient denies fevers, chills, chest pain, shortness breath, constipation, nausea, vomiting.  ED Course: Vital signs in the ED largely stable with heart rate 50s-70s, blood pressure 110s-150s.  Lab workup included CMP with BUN 25, creatinine stable 1.33, glucose 204, protein 6.2.  CBC within normal limits.  PT, PTT, INR pending.  Lipase normal.  Patient typed and screened in the ED.  Urinalysis with hemoglobin, protein, leukocytes, rare bacteria.  CT angio GI bleed study was positive for arterial contrast extravasation from diverticula at the sigmoid colon consistent with active diverticular bleeding.  Also noted was further diverticulosis without diverticulitis.  EDP has reached out for GI consult but has not yet heard back.  Review of Systems: As per HPI otherwise all other systems reviewed and are negative.  Past Medical History:  Diagnosis Date   Acute on chronic diastolic (congestive) heart failure (HCC) 11/21/2018   Cancer (HCC)    skin cancer   DDD (degenerative disc disease)    Diverticula of colon    Diverticulitis    DJD  (degenerative joint disease)    GERD (gastroesophageal reflux disease)    takes ranitidine   Heart murmur    History of hiatal hernia    History of pneumonia    Hx of irritable bowel syndrome    Hyperlipidemia    Hypertension    Occasional tremors    Osteoporosis    Pre-diabetes    Varicose veins    Vitamin D  deficiency     Past Surgical History:  Procedure Laterality Date   APPENDECTOMY     BREAST EXCISIONAL BIOPSY Right    BREAST SURGERY Right    fluid drained from breast   CARDIOVERSION N/A 12/20/2018   Procedure: CARDIOVERSION;  Surgeon: Lonni Slain, MD;  Location: [facility] ENDOSCOPY;  Service: Cardiovascular;  Laterality: N/A;   CARDIOVERSION N/A 06/16/2019   Procedure: CARDIOVERSION;  Surgeon: Okey Vina GAILS, MD;  Location: [facility] ENDOSCOPY;  Service: Cardiovascular;  Laterality: N/A;   CARDIOVERSION N/A 05/17/2023   Procedure: CARDIOVERSION;  Surgeon: Okey Vina GAILS, MD;  Location: [facility] INVASIVE CV LAB;  Service: Cardiovascular;  Laterality: N/A;   COLONOSCOPY     ESOPHAGOGASTRODUODENOSCOPY     FLEXIBLE SIGMOIDOSCOPY N/A 03/09/2021   Procedure: FLEXIBLE SIGMOIDOSCOPY;  Surgeon: Shila Gustav GAILS, MD;  Location: [facility] ENDOSCOPY;  Service: Endoscopy;  Laterality: N/A;   HEMOSTASIS CLIP PLACEMENT  03/09/2021   Procedure: HEMOSTASIS CLIP PLACEMENT;  Surgeon: Shila Gustav GAILS, MD;  Location: [facility] ENDOSCOPY;  Service: Endoscopy;;   JOINT REPLACEMENT Left    left hip   LUMBAR LAMINECTOMY/DECOMPRESSION MICRODISCECTOMY N/A 06/05/2015   Procedure: L3-L5 DECOMPRESSION ;  Surgeon: Donaciano Sprang, MD;  Location: [facility] OR;  Service: Orthopedics;  Laterality: N/A;   OPEN REDUCTION INTERNAL FIXATION (ORIF) DISTAL RADIAL FRACTURE Right 01/10/2014   Procedure: OPEN REDUCTION INTERNAL FIXATION (ORIF) DISTAL RADIAL FRACTURE;  Surgeon: Prentice LELON Pagan, MD;  Location: [facility] OR;  Service: Orthopedics;  Laterality: Right;   SPINAL CORD DECOMPRESSION  06/05/2015   L3 L 5   THYROID  SURGERY     TONSILLECTOMY      TUBAL LIGATION     varicose veins stripped     WRIST FRACTURE SURGERY Bilateral     Social History  reports that she has never smoked. She has never used smokeless tobacco. She reports that she does not drink alcohol  and does not use drugs.  Allergies  Allergen Reactions   Codeine Rash   Accupril [Quinapril Hcl] Cough   Prednisone      Agitated and shaky   Reglan [Metoclopramide] Other (See Comments)    tremors   Tramadol  Nausea And Vomiting   Adhesive [Tape] Rash   Neosporin [Neomycin-Bacitracin Zn-Polymyx] Itching and Rash    Family History  Problem Relation Age of Onset   Hypertension Mother    Hyperlipidemia Mother    Heart disease Sister    Cancer Brother        prostate   Colon cancer Neg Hx    Esophageal cancer Neg Hx    Pancreatic cancer Neg Hx    Stomach cancer Neg Hx    Liver disease Neg Hx    Breast cancer Neg Hx   Reviewed on admission  Prior to Admission medications   Medication Sig Start Date End Date Taking? Authorizing Provider  acetaminophen  (TYLENOL ) 500 MG tablet Take 500 mg by mouth every 6 (six) hours as needed for moderate pain.    [provider]  albuterol  (PROAIR  HFA) 108 (90 Base) MCG/ACT inhaler Take  2 inhalations  15 minutes apart  every 4 hours  as needed for Wheezing 03/09/22   Tonita Fallow, MD  amiodarone  (PACERONE ) 200 MG tablet Take 1 tablet by mouth once daily 10/04/23   Burnard Debby LABOR, MD  Ascorbic Acid  (VITAMIN C  PO) Take 1 tablet by mouth at bedtime.    [provider]  Boswellia-Glucosamine-Vit D (OSTEO BI-FLEX ONE PER DAY PO) Take 1 tablet by mouth in the morning.    [provider]  Carboxymeth-Glycerin -Polysorb (REFRESH DIGITAL OP) Place 1 drop into the right eye daily as needed (dry eyes).    [provider]  cholecalciferol  (VITAMIN D3) 25 MCG (1000 UNIT) tablet Take 3,000 Units by mouth daily.    [provider]  Cyanocobalamin  (B-12 PO) Take 1 capsule by mouth daily.    [provider]  diazepam  (VALIUM ) 2 MG tablet Take 1 tablet 3 - 4 x /day for Tremor                                     /                                 TAKE                    BY                     MOUTH Patient taking differently: Take 2 mg by mouth  as needed (Tremors). 06/20/22   Tonita Fallow, MD  diltiazem  (CARDIZEM  CD) 180 MG 24 hr capsule Take 1 capsule  Daily for Heart & BP                                                                                                   /                          TAKE                                      BY                                            MOUTH                                                   ONCE ? DAILY Patient taking differently: Take 180 mg by mouth in the morning. 02/15/23   Tonita Fallow, MD  Ferrous Sulfate (SLOW RELEASE IRON PO) Take 1 tablet by mouth at bedtime.    [provider]  furosemide  (LASIX ) 40 MG tablet Take 1 tablet (40 mg total) by mouth daily. 06/14/23 06/13/24  Wilkinson, Dana E, NP  loratadine (CLARITIN) 10 MG tablet Take 10 mg by mouth daily as needed for allergies.    [provider]  Multiple Vitamins-Minerals (ZINC PO) Take 1 capsule by mouth daily.    [provider]  polyethylene glycol (MIRALAX  / GLYCOLAX ) 17 g packet Take 17 g by mouth daily. 03/11/21   Akula, Vijaya, MD  Pyridoxine HCl (B-6 PO) Take 1 tablet by mouth daily.    [provider]  Simethicone (GAS-X PO) Take 1 tablet by mouth daily as needed (gas).    [provider]  XARELTO  15 MG TABS tablet TAKE 1 TABLET BY MOUTH ONCE DAILY WITH SUPPER **D/C  ELIQUIS ** 11/08/23   Fenton, Jenkins R, [name]    Physical Exam: Vitals:   11/20/23 1121 11/20/23 1130 11/20/23 1145 11/20/23 1200  BP: 132/69 119/75 129/72 123/70  Pulse: (!) 58 61 (!) 55 (!) 57  Resp: 14     Temp: 98 F (36.7 C)     TempSrc: Oral     SpO2: 100% 98% 98% 97%  Weight:      Height:        Physical Exam Constitutional:      General: She is not  in acute distress.    Appearance: Normal appearance.  HENT:     Head: Normocephalic and atraumatic.     Mouth/Throat:     Mouth: Mucous membranes are moist.     Pharynx: Oropharynx is clear.  Eyes:  Extraocular Movements: Extraocular movements intact.     Pupils: Pupils are equal, round, and reactive to light.  Cardiovascular:     Rate and Rhythm: Normal rate and regular rhythm.     Pulses: Normal pulses.     Heart sounds: Normal heart sounds.  Pulmonary:     Effort: Pulmonary effort is normal. No respiratory distress.     Breath sounds: Normal breath sounds.  Abdominal:     General: Bowel sounds are normal. There is no distension.     Palpations: Abdomen is soft.     Tenderness: There is abdominal tenderness (mild).  Musculoskeletal:        General: No swelling or deformity.  Skin:    General: Skin is warm and dry.  Neurological:     General: No focal deficit present.     Mental Status: Mental status is at baseline.    Labs on Admission: I have personally reviewed following labs and imaging studies  CBC: Recent Labs  Lab 11/20/23 0810  WBC 7.4  NEUTROABS 5.4  HGB 12.5  HCT 39.9  MCV 95.5  PLT 207    Basic Metabolic Panel: Recent Labs  Lab 11/20/23 0810  NA 139  K 4.1  CL 105  CO2 23  GLUCOSE 204*  BUN 25*  CREATININE 1.33*  CALCIUM 9.7    GFR: Estimated Creatinine Clearance: 23.8 mL/min (A) (by C-G formula based on SCr of 1.33 mg/dL (H)).  Liver Function Tests: Recent Labs  Lab 11/20/23 0810  AST 23  ALT 19  ALKPHOS 97  BILITOT 0.9  PROT 6.2*  ALBUMIN  3.9    Urine analysis:    Component Value Date/Time   COLORURINE YELLOW 11/20/2023 1133   APPEARANCEUR CLOUDY (A) 11/20/2023 1133   LABSPEC 1.016 11/20/2023 1133   PHURINE 5.0 11/20/2023 1133   GLUCOSEU NEGATIVE 11/20/2023 1133   HGBUR MODERATE (A) 11/20/2023 1133   BILIRUBINUR NEGATIVE 11/20/2023 1133   KETONESUR NEGATIVE 11/20/2023 1133   PROTEINUR 30 (A) 11/20/2023 1133    UROBILINOGEN 0.2 12/12/2012 1240   NITRITE NEGATIVE 11/20/2023 1133   LEUKOCYTESUR LARGE (A) 11/20/2023 1133    Radiological Exams on Admission: CT ANGIO GI BLEED Result Date: 11/20/2023 CLINICAL DATA:  Abdominal pain, rectal bleeding EXAM: CTA ABDOMEN AND PELVIS WITHOUT AND WITH CONTRAST TECHNIQUE: Multidetector CT imaging of the abdomen and pelvis was performed using the standard protocol during bolus administration of intravenous contrast. Multiplanar reconstructed images and MIPs were obtained and reviewed to evaluate the vascular anatomy. RADIATION DOSE REDUCTION: This exam was performed according to the departmental dose-optimization program which includes automated exposure control, adjustment of the mA and/or kV according to patient size and/or use of iterative reconstruction technique. CONTRAST:  75mL OMNIPAQUE  IOHEXOL  350 MG/ML SOLN COMPARISON:  09/15/2022 FINDINGS: VASCULAR Normal contour and caliber of the abdominal aorta. No evidence of aneurysm, dissection, or other acute aortic pathology. Standard branching pattern of the abdominal aorta with solitary bilateral renal arteries. Moderate mixed calcific atherosclerosis. Review of the MIP images confirms the above findings. NON-VASCULAR Lower Chest: No acute findings. Moderate hiatal hernia with intrathoracic position of the gastric fundus. Cardiomegaly. Hepatobiliary: No solid liver abnormality is seen. No gallstones, gallbladder wall thickening, or biliary dilatation. Pancreas: Unremarkable. No pancreatic ductal dilatation or surrounding inflammatory changes. Spleen: Normal in size without significant abnormality. Adrenals/Urinary Tract: Benign, macroscopic fat containing left adrenal adenoma, for which no further follow-up or characterization is required. Multiple simple, benign bilateral renal cortical cysts, including a large cyst arising from the  inferior pole of the left kidney, requiring no specific further follow-up or characterization.  Kidneys are otherwise normal, without renal calculi, solid lesion, or hydronephrosis. Bladder is unremarkable. Stomach/Bowel: Stomach is within normal limits. Appendix appears normal. No evidence of bowel wall thickening, distention, or inflammatory changes. Descending and sigmoid diverticulosis without evidence of acute diverticulitis. Arterial contrast extravasation arising from a prominent diverticulum in the mid sigmoid (series 8, image 136). Lymphatic: No enlarged abdominal or pelvic lymph nodes. Reproductive: No mass or other significant abnormality. Other: Right inguinal hernia containing nonobstructed distal small bowel (series 8, image 172). No ascites. Musculoskeletal: No acute osseous findings. Status post left hip total arthroplasty. IMPRESSION: 1. Arterial contrast extravasation arising from a prominent diverticulum in the mid sigmoid, consistent with active diverticular bleeding. 2. Descending and sigmoid diverticulosis without evidence of acute diverticulitis. 3. Normal contour and caliber of the abdominal aorta. No evidence of aneurysm, dissection, or other acute aortic pathology. Moderate mixed calcific atherosclerosis. 4. Right inguinal hernia containing nonobstructed distal small bowel. Electronically Signed   By: Marolyn JONETTA Jaksch M.D.   On: 11/20/2023 12:49    EKG: Not performed in emergency department Assessment/Plan Principal Problem:   Acute GI bleeding Active Problems:   Essential hypertension   Type 2 diabetes mellitus with hyperlipidemia (HCC)   GERD   Obesity (BMI 30.0-34.9)   CKD stage 3 due to type 2 diabetes mellitus (HCC)   Chronic diastolic heart failure (HCC)   Persistent atrial fibrillation (HCC)   Acute GI bleed > Patient presenting with ongoing rectal bleeding with large volume red stool and large clots starting around 430 this morning.  2 further episodes witnessed in the ED. > Initial hemoglobin this morning around 8 AM was normal but has had further bleeding  since and CTA angio showed active contrast extravasation/GI bleed from diverticula in the sigmoid colon. > GI consulted, EDP is waiting to hear back. - Monitor on progressive unit for now given active bleed - Appreciate GI recommendations and assistance - Trend hemoglobin, with initial repeat now - N.p.o. for now, pending GI recommendations - Holding Xarelto  - Supportive care  Hypertension - Holding home diltiazem  and Lasix  in the setting of active GI bleed with risk for hypotension  Diabetes > Last A1c 6.5.  - Diet controlled  CKD 3 > Creatinine stable at 1.33 in the ED. - Trend renal function and electrolytes  Atrial fibrillation - Continue home amiodarone  - Holding home diltiazem  and Xarelto  as above  Obesity - Noted  DVT prophylaxis: SCDs Code Status:   Full Family Communication:  Updated at bedside  Disposition Plan:   Patient is from:  Home  Anticipated DC to:  Home  Anticipated DC date:  1 to 3 days  Anticipated DC barriers: None  Consults called:  Gastroenterology Admission status:  Observation, progressive  Severity of Illness: The appropriate patient status for this patient is OBSERVATION. Observation status is judged to be reasonable and necessary in order to provide the required intensity of service to ensure the patient's safety. The patient's presenting symptoms, physical exam findings, and initial radiographic and laboratory data in the context of their medical condition is felt to place them at decreased risk for further clinical deterioration. Furthermore, it is anticipated that the patient will be medically stable for discharge from the hospital within 2 midnights of admission.    Marsa KATHEE Scurry MD [facility]  How to contact the [facility] Attending or Consulting provider 7A - 7P or covering provider during after hours 7P -  7A, for this patient?   Check the care team in [facility] and look for a) attending/consulting [facility] provider listed and b) the [facility]  team listed Log into [email or web address] and use [place]'s universal password to access. If you do not have the password, please contact the hospital operator. Locate the [facility] provider you are looking for under [facility] and page to a number that you can be directly reached. If you still have difficulty reaching the provider, please page the [facility] (Director on Call) for the Hospitalists listed on amion for assistance.  11/20/2023, 1:18 PM

## 2023-11-20 NOTE — ED Notes (Signed)
 ED TO INPATIENT HANDOFF REPORT  ED Nurse Name and Phone #: [phone]  S Name/Age/Gender Briana [name] 89 y.o. female Room/Bed: 031C/031C  Code Status   Code Status: Full Code  Home/SNF/Other Home Patient oriented to: self, place, time, and situation Is this baseline? Yes   Triage Complete: Triage complete  Chief Complaint Acute GI bleeding [K92.2]  Triage Note Pt arrived POV from home stating she woke up to use the restroom this morning at 430am and she started passing blood clots. Pt states she thinks it is bleeding from her rectum.    Allergies Allergies  Allergen Reactions   Codeine Rash   Accupril [Quinapril Hcl] Cough   Prednisone      Agitated and shaky   Reglan [Metoclopramide] Other (See Comments)    tremors   Tramadol  Nausea And Vomiting   Adhesive [Tape] Rash   Neosporin [Neomycin-Bacitracin Zn-Polymyx] Itching and Rash    Level of Care/Admitting Diagnosis ED Disposition     ED Disposition  Admit   Condition  --   Comment  Hospital Area: [facility] [[id number]]  Level of Care: Progressive [102]  Admit to Progressive based on following criteria: GI, ENDOCRINE disease patients with GI bleeding, acute liver failure or pancreatitis, stable with diabetic ketoacidosis or thyrotoxicosis (hypothyroid) state.  May place patient in observation at [facility] or [facility] if equivalent level of care is available:: No  Covid Evaluation: Asymptomatic - no recent exposure (last 10 days) testing not required  Diagnosis: Acute GI bleeding [253168]  Admitting Physician: MELVIN, ALEXANDER B [[id number]]  Attending Physician: MELVIN, ALEXANDER B [[id number]]          B Medical/Surgery History Past Medical History:  Diagnosis Date   Acute on chronic diastolic (congestive) heart failure (HCC) 11/21/2018   Cancer (HCC)    skin cancer   DDD (degenerative disc disease)    Diverticula of colon    Diverticulitis    DJD (degenerative joint disease)     GERD (gastroesophageal reflux disease)    takes ranitidine   Heart murmur    History of hiatal hernia    History of pneumonia    Hx of irritable bowel syndrome    Hyperlipidemia    Hypertension    Occasional tremors    Osteoporosis    Pre-diabetes    Varicose veins    Vitamin D  deficiency    Past Surgical History:  Procedure Laterality Date   APPENDECTOMY     BREAST EXCISIONAL BIOPSY Right    BREAST SURGERY Right    fluid drained from breast   CARDIOVERSION N/A 12/20/2018   Procedure: CARDIOVERSION;  Surgeon: Lonni Slain, MD;  Location: [facility] ENDOSCOPY;  Service: Cardiovascular;  Laterality: N/A;   CARDIOVERSION N/A 06/16/2019   Procedure: CARDIOVERSION;  Surgeon: Okey Vina GAILS, MD;  Location: [facility] ENDOSCOPY;  Service: Cardiovascular;  Laterality: N/A;   CARDIOVERSION N/A 05/17/2023   Procedure: CARDIOVERSION;  Surgeon: Okey Vina GAILS, MD;  Location: [facility] INVASIVE CV LAB;  Service: Cardiovascular;  Laterality: N/A;   COLONOSCOPY     ESOPHAGOGASTRODUODENOSCOPY     FLEXIBLE SIGMOIDOSCOPY N/A 03/09/2021   Procedure: FLEXIBLE SIGMOIDOSCOPY;  Surgeon: Shila Gustav GAILS, MD;  Location: [facility] ENDOSCOPY;  Service: Endoscopy;  Laterality: N/A;   HEMOSTASIS CLIP PLACEMENT  03/09/2021   Procedure: HEMOSTASIS CLIP PLACEMENT;  Surgeon: Shila Gustav GAILS, MD;  Location: [facility] ENDOSCOPY;  Service: Endoscopy;;   JOINT REPLACEMENT Left    left hip   LUMBAR LAMINECTOMY/DECOMPRESSION MICRODISCECTOMY N/A 06/05/2015   Procedure:  L3-L5 DECOMPRESSION ;  Surgeon: Donaciano Sprang, MD;  Location: [facility] OR;  Service: Orthopedics;  Laterality: N/A;   OPEN REDUCTION INTERNAL FIXATION (ORIF) DISTAL RADIAL FRACTURE Right 01/10/2014   Procedure: OPEN REDUCTION INTERNAL FIXATION (ORIF) DISTAL RADIAL FRACTURE;  Surgeon: Prentice LELON Pagan, MD;  Location: [facility] OR;  Service: Orthopedics;  Laterality: Right;   SPINAL CORD DECOMPRESSION  06/05/2015   L3 L 5   THYROID  SURGERY     TONSILLECTOMY     TUBAL LIGATION     varicose veins  stripped     WRIST FRACTURE SURGERY Bilateral      A IV Location/Drains/Wounds Patient Lines/Drains/Airways Status     Active Line/Drains/Airways     Name Placement date Placement time Site Days   Peripheral IV 11/20/23 20 G Anterior;Right Forearm 11/20/23  1154  Forearm  less than 1   Closed System Drain 1 Back Accordion (Hemovac) 10 Fr. 06/05/15  1546  Back  3090            Intake/Output Last 24 hours No intake or output data in the 24 hours ending 11/20/23 1456  Labs/Imaging Results for orders placed or performed during the hospital encounter of 11/20/23 (from the past 48 hours)  CBC with Differential     Status: None   Collection Time: 11/20/23  8:10 AM  Result Value Ref Range   WBC 7.4 4.0 - 10.5 K/uL   RBC 4.18 3.87 - 5.11 MIL/uL   Hemoglobin 12.5 12.0 - 15.0 g/dL   HCT 60.0 63.9 - 53.9 %   MCV 95.5 80.0 - 100.0 fL   MCH 29.9 26.0 - 34.0 pg   MCHC 31.3 30.0 - 36.0 g/dL   RDW 84.8 88.4 - 84.4 %   Platelets 207 150 - 400 K/uL   nRBC 0.0 0.0 - 0.2 %   Neutrophils Relative % 72 %   Neutro Abs 5.4 1.7 - 7.7 K/uL   Lymphocytes Relative 13 %   Lymphs Abs 1.0 0.7 - 4.0 K/uL   Monocytes Relative 10 %   Monocytes Absolute 0.7 0.1 - 1.0 K/uL   Eosinophils Relative 3 %   Eosinophils Absolute 0.2 0.0 - 0.5 K/uL   Basophils Relative 1 %   Basophils Absolute 0.0 0.0 - 0.1 K/uL   Immature Granulocytes 1 %   Abs Immature Granulocytes 0.05 0.00 - 0.07 K/uL    Comment: Performed at [facility], [address], [place], [place] [address]  Comprehensive metabolic panel     Status: Abnormal   Collection Time: 11/20/23  8:10 AM  Result Value Ref Range   Sodium 139 135 - 145 mmol/L   Potassium 4.1 3.5 - 5.1 mmol/L   Chloride 105 98 - 111 mmol/L   CO2 23 22 - 32 mmol/L   Glucose, Bld 204 (H) 70 - 99 mg/dL    Comment: Glucose reference range applies only to samples taken after fasting for at least 8 hours.   BUN 25 (H) 8 - 23 mg/dL   Creatinine, Ser 8.66 (H) 0.44 -  1.00 mg/dL   Calcium 9.7 8.9 - 89.6 mg/dL   Total Protein 6.2 (L) 6.5 - 8.1 g/dL   Albumin  3.9 3.5 - 5.0 g/dL   AST 23 15 - 41 U/L   ALT 19 0 - 44 U/L   Alkaline Phosphatase 97 38 - 126 U/L   Total Bilirubin 0.9 0.0 - 1.2 mg/dL   GFR, Estimated 39 (L) >60 mL/min    Comment: (NOTE) Calculated  using the CKD-EPI Creatinine Equation (2021)    Anion gap 11 5 - 15    Comment: Performed at [facility], [address], [place], [place] [address]  Lipase, blood     Status: None   Collection Time: 11/20/23  8:10 AM  Result Value Ref Range   Lipase 25 11 - 51 U/L    Comment: Performed at [facility], [address] [place], [place] [address]  Type and screen [facility]     Status: None (Preliminary result)   Collection Time: 11/20/23  8:10 AM  Result Value Ref Range   ABO/RH(D) O POS    Antibody Screen POS    Sample Expiration 11/23/2023,2359    Antibody Identification ANTI JKA (Kidd a)    Unit Number [id number]    Blood Component Type RBC LR PHER1    Unit division 00    Status of Unit ALLOCATED    Transfusion Status OK TO TRANSFUSE    Crossmatch Result      COMPATIBLE Performed at [facility], [address], [place], [place] [address]    Unit Number [id number]    Blood Component Type RBC LR PHER2    Unit division 00    Status of Unit ALLOCATED    Transfusion Status OK TO TRANSFUSE    Crossmatch Result COMPATIBLE   Urinalysis, Routine w reflex microscopic -Urine, Clean Catch     Status: Abnormal   Collection Time: 11/20/23 11:33 AM  Result Value Ref Range   Color, Urine YELLOW YELLOW   APPearance CLOUDY (A) CLEAR   Specific Gravity, Urine 1.016 1.005 - 1.030   pH 5.0 5.0 - 8.0   Glucose, UA NEGATIVE NEGATIVE mg/dL   Hgb urine dipstick MODERATE (A) NEGATIVE   Bilirubin Urine NEGATIVE NEGATIVE   Ketones, ur NEGATIVE NEGATIVE mg/dL   Protein, ur 30 (A) NEGATIVE mg/dL   Nitrite NEGATIVE NEGATIVE   Leukocytes,Ua LARGE (A) NEGATIVE    RBC / HPF 21-50 0 - 5 RBC/hpf   WBC, UA >50 0 - 5 WBC/hpf   Bacteria, UA FEW (A) NONE SEEN   Squamous Epithelial / HPF 0-5 0 - 5 /HPF   WBC Clumps PRESENT    Mucus PRESENT    Non Squamous Epithelial 0-5 (A) NONE SEEN    Comment: Performed at [facility], [address], [place], [place] [address]  CBC     Status: Abnormal   Collection Time: 11/20/23  2:35 PM  Result Value Ref Range   WBC 5.7 4.0 - 10.5 K/uL   RBC 3.51 (L) 3.87 - 5.11 MIL/uL   Hemoglobin 10.5 (L) 12.0 - 15.0 g/dL   HCT 66.2 (L) 63.9 - 53.9 %   MCV 96.0 80.0 - 100.0 fL   MCH 29.9 26.0 - 34.0 pg   MCHC 31.2 30.0 - 36.0 g/dL   RDW 84.7 88.4 - 84.4 %   Platelets 189 150 - 400 K/uL   nRBC 0.0 0.0 - 0.2 %    Comment: Performed at [facility], [address] [place], [place] [address]   CT ANGIO GI BLEED Result Date: 11/20/2023 CLINICAL DATA:  Abdominal pain, rectal bleeding EXAM: CTA ABDOMEN AND PELVIS WITHOUT AND WITH CONTRAST TECHNIQUE: Multidetector CT imaging of the abdomen and pelvis was performed using the standard protocol during bolus administration of intravenous contrast. Multiplanar reconstructed images and MIPs were obtained and reviewed to evaluate the vascular anatomy. RADIATION DOSE REDUCTION: This  exam was performed according to the departmental dose-optimization program which includes automated exposure control, adjustment of the mA and/or kV according to patient size and/or use of iterative reconstruction technique. CONTRAST:  75mL OMNIPAQUE  IOHEXOL  350 MG/ML SOLN COMPARISON:  09/15/2022 FINDINGS: VASCULAR Normal contour and caliber of the abdominal aorta. No evidence of aneurysm, dissection, or other acute aortic pathology. Standard branching pattern of the abdominal aorta with solitary bilateral renal arteries. Moderate mixed calcific atherosclerosis. Review of the MIP images confirms the above findings. NON-VASCULAR Lower Chest: No acute findings. Moderate hiatal hernia with intrathoracic  position of the gastric fundus. Cardiomegaly. Hepatobiliary: No solid liver abnormality is seen. No gallstones, gallbladder wall thickening, or biliary dilatation. Pancreas: Unremarkable. No pancreatic ductal dilatation or surrounding inflammatory changes. Spleen: Normal in size without significant abnormality. Adrenals/Urinary Tract: Benign, macroscopic fat containing left adrenal adenoma, for which no further follow-up or characterization is required. Multiple simple, benign bilateral renal cortical cysts, including a large cyst arising from the inferior pole of the left kidney, requiring no specific further follow-up or characterization. Kidneys are otherwise normal, without renal calculi, solid lesion, or hydronephrosis. Bladder is unremarkable. Stomach/Bowel: Stomach is within normal limits. Appendix appears normal. No evidence of bowel wall thickening, distention, or inflammatory changes. Descending and sigmoid diverticulosis without evidence of acute diverticulitis. Arterial contrast extravasation arising from a prominent diverticulum in the mid sigmoid (series 8, image 136). Lymphatic: No enlarged abdominal or pelvic lymph nodes. Reproductive: No mass or other significant abnormality. Other: Right inguinal hernia containing nonobstructed distal small bowel (series 8, image 172). No ascites. Musculoskeletal: No acute osseous findings. Status post left hip total arthroplasty. IMPRESSION: 1. Arterial contrast extravasation arising from a prominent diverticulum in the mid sigmoid, consistent with active diverticular bleeding. 2. Descending and sigmoid diverticulosis without evidence of acute diverticulitis. 3. Normal contour and caliber of the abdominal aorta. No evidence of aneurysm, dissection, or other acute aortic pathology. Moderate mixed calcific atherosclerosis. 4. Right inguinal hernia containing nonobstructed distal small bowel. Electronically Signed   By: Marolyn JONETTA Jaksch M.D.   On: 11/20/2023 12:49     Pending Labs Unresulted Labs (From admission, onward)     Start     Ordered   11/21/23 0500  Comprehensive metabolic panel  Tomorrow morning,   R        11/20/23 1316   11/20/23 1314  CBC  Now then every 8 hours,   R (with TIMED occurrences)      11/20/23 1316   11/20/23 1256  APTT  Once,   STAT        11/20/23 1256   11/20/23 1256  Protime-INR  Once,   STAT        11/20/23 1256            Vitals/Pain Today's Vitals   11/20/23 1345 11/20/23 1400 11/20/23 1415 11/20/23 1430  BP: 125/74 128/68 124/78 137/72  Pulse: 64 (!) 58 (!) 57 66  Resp:      Temp:      TempSrc:      SpO2: 98% 99% 98% 97%  Weight:      Height:      PainSc:        Isolation Precautions No active isolations  Medications Medications  amiodarone  (PACERONE ) tablet 200 mg (has no administration in time range)  Carboxymeth-Glycerin -Polysorb 0.5-1-0.5 % SOLN (has no administration in time range)  sodium chloride  flush (NS) 0.9 % injection 3 mL (3 mLs Intravenous Not Given 11/20/23 1434)  acetaminophen  (TYLENOL ) tablet 650  mg (has no administration in time range)    Or  acetaminophen  (TYLENOL ) suppository 650 mg (has no administration in time range)  iohexol  (OMNIPAQUE ) 350 MG/ML injection 75 mL (75 mLs Intravenous Contrast Given 11/20/23 1215)    Mobility walks     Focused Assessments GI   R Recommendations: See Admitting Provider Note  Report given to:   Additional Notes: multiple large bloody  bowel movements

## 2023-11-20 NOTE — ED Notes (Signed)
 Patient transported to CT

## 2023-11-20 NOTE — Transfer of Care (Signed)
 Immediate Anesthesia Transfer of Care Note  Patient: Briana [name]  Procedure(s) Performed: FLEXIBLE SIGMOIDOSCOPY CLIP MARKER PLACEMENT  Patient Location: PACU  Anesthesia Type:MAC  Level of Consciousness: awake and alert   Airway & Oxygen Therapy: Patient Spontanous Breathing and Patient connected to nasal cannula oxygen  Post-op Assessment: Report given to RN and Post -op Vital signs reviewed and stable  Post vital signs: Reviewed and stable  Last Vitals:  Vitals Value Taken Time  BP    Temp    Pulse    Resp    SpO2      Last Pain:  Vitals:   11/20/23 1701  TempSrc: Temporal  PainSc: 0-No pain      Patients Stated Pain Goal: 0 (11/20/23 1542)  Complications: No notable events documented.

## 2023-11-20 NOTE — ED Notes (Signed)
 This is my second time taking pt to the RR and on both times the toilet water was full of blood and had the GI smell. Pt did well getting to and from toilet to wheelchair. Pt did note that she does feel very week. I informed EDMD and ED PA of what I saw.

## 2023-11-20 NOTE — ED Provider Triage Note (Addendum)
 Emergency Medicine Provider Triage Evaluation Note  Briana [name] , a 89 y.o. female  was evaluated in triage.  Pt complains of melena with clots that started at 0430 this morning. Takes Xarelto   Endorses blood from rectum without need to have BM so has been wearing feminine pad. Has had to change pad three times today.  Has had GIB in 03/06/2021  Review of Systems  Positive: melena Negative: Fevers, N/V, dizziness, lightheadedness, rectal pain  Physical Exam  BP (!) 151/96 (BP Location: Right Arm)   Pulse 76   Temp 97.6 F (36.4 C)   Resp 18   Ht 5' (1.524 m)   Wt 58.1 kg   SpO2 96%   BMI 25.00 kg/m  Gen:   Awake, no distress   Resp:  Normal effort  MSK:   Moves extremities without difficulty  Other:    Medical Decision Making  Medically screening exam initiated at 7:56 AM.  Appropriate orders placed.  Briana [name] was informed that the remainder of the evaluation will be completed by another provider, this initial triage assessment does not replace that evaluation, and the importance of remaining in the ED until their evaluation is complete.  Labs ordered    Briana [name], Briana [name] 11/20/23 [phone]

## 2023-11-21 DIAGNOSIS — D6832 Hemorrhagic disorder due to extrinsic circulating anticoagulants: Secondary | ICD-10-CM | POA: Diagnosis present

## 2023-11-21 DIAGNOSIS — Z8249 Family history of ischemic heart disease and other diseases of the circulatory system: Secondary | ICD-10-CM | POA: Diagnosis not present

## 2023-11-21 DIAGNOSIS — K5791 Diverticulosis of intestine, part unspecified, without perforation or abscess with bleeding: Secondary | ICD-10-CM | POA: Diagnosis present

## 2023-11-21 DIAGNOSIS — Z91048 Other nonmedicinal substance allergy status: Secondary | ICD-10-CM | POA: Diagnosis not present

## 2023-11-21 DIAGNOSIS — Z8701 Personal history of pneumonia (recurrent): Secondary | ICD-10-CM | POA: Diagnosis not present

## 2023-11-21 DIAGNOSIS — E669 Obesity, unspecified: Secondary | ICD-10-CM | POA: Diagnosis present

## 2023-11-21 DIAGNOSIS — E1122 Type 2 diabetes mellitus with diabetic chronic kidney disease: Secondary | ICD-10-CM | POA: Diagnosis present

## 2023-11-21 DIAGNOSIS — Z885 Allergy status to narcotic agent status: Secondary | ICD-10-CM | POA: Diagnosis not present

## 2023-11-21 DIAGNOSIS — Z8042 Family history of malignant neoplasm of prostate: Secondary | ICD-10-CM | POA: Diagnosis not present

## 2023-11-21 DIAGNOSIS — E785 Hyperlipidemia, unspecified: Secondary | ICD-10-CM | POA: Diagnosis present

## 2023-11-21 DIAGNOSIS — I5032 Chronic diastolic (congestive) heart failure: Secondary | ICD-10-CM | POA: Diagnosis present

## 2023-11-21 DIAGNOSIS — M810 Age-related osteoporosis without current pathological fracture: Secondary | ICD-10-CM | POA: Diagnosis present

## 2023-11-21 DIAGNOSIS — K219 Gastro-esophageal reflux disease without esophagitis: Secondary | ICD-10-CM | POA: Diagnosis present

## 2023-11-21 DIAGNOSIS — Z881 Allergy status to other antibiotic agents status: Secondary | ICD-10-CM | POA: Diagnosis not present

## 2023-11-21 DIAGNOSIS — Z7901 Long term (current) use of anticoagulants: Secondary | ICD-10-CM | POA: Diagnosis not present

## 2023-11-21 DIAGNOSIS — Z83438 Family history of other disorder of lipoprotein metabolism and other lipidemia: Secondary | ICD-10-CM | POA: Diagnosis not present

## 2023-11-21 DIAGNOSIS — N1832 Chronic kidney disease, stage 3b: Secondary | ICD-10-CM | POA: Diagnosis present

## 2023-11-21 DIAGNOSIS — K922 Gastrointestinal hemorrhage, unspecified: Secondary | ICD-10-CM | POA: Diagnosis not present

## 2023-11-21 DIAGNOSIS — Z888 Allergy status to other drugs, medicaments and biological substances status: Secondary | ICD-10-CM | POA: Diagnosis not present

## 2023-11-21 DIAGNOSIS — I4819 Other persistent atrial fibrillation: Secondary | ICD-10-CM | POA: Diagnosis present

## 2023-11-21 DIAGNOSIS — D62 Acute posthemorrhagic anemia: Secondary | ICD-10-CM | POA: Diagnosis present

## 2023-11-21 DIAGNOSIS — Z85828 Personal history of other malignant neoplasm of skin: Secondary | ICD-10-CM | POA: Diagnosis not present

## 2023-11-21 DIAGNOSIS — I130 Hypertensive heart and chronic kidney disease with heart failure and stage 1 through stage 4 chronic kidney disease, or unspecified chronic kidney disease: Secondary | ICD-10-CM | POA: Diagnosis present

## 2023-11-21 DIAGNOSIS — K5731 Diverticulosis of large intestine without perforation or abscess with bleeding: Secondary | ICD-10-CM | POA: Diagnosis not present

## 2023-11-21 DIAGNOSIS — T80818A Extravasation of other vesicant agent, initial encounter: Secondary | ICD-10-CM | POA: Diagnosis present

## 2023-11-21 DIAGNOSIS — K921 Melena: Secondary | ICD-10-CM | POA: Diagnosis not present

## 2023-11-21 DIAGNOSIS — E1169 Type 2 diabetes mellitus with other specified complication: Secondary | ICD-10-CM | POA: Diagnosis present

## 2023-11-21 LAB — COMPREHENSIVE METABOLIC PANEL
ALT: 18 U/L (ref 0–44)
AST: 19 U/L (ref 15–41)
Albumin: 3.1 g/dL — ABNORMAL LOW (ref 3.5–5.0)
Alkaline Phosphatase: 74 U/L (ref 38–126)
Anion gap: 8 (ref 5–15)
BUN: 21 mg/dL (ref 8–23)
CO2: 24 mmol/L (ref 22–32)
Calcium: 9.3 mg/dL (ref 8.9–10.3)
Chloride: 107 mmol/L (ref 98–111)
Creatinine, Ser: 1.27 mg/dL — ABNORMAL HIGH (ref 0.44–1.00)
GFR, Estimated: 41 mL/min — ABNORMAL LOW (ref >60)
Glucose, Bld: 159 mg/dL — ABNORMAL HIGH (ref 70–99)
Potassium: 4.1 mmol/L (ref 3.5–5.1)
Sodium: 139 mmol/L (ref 135–145)
Total Bilirubin: 0.8 mg/dL (ref 0.0–1.2)
Total Protein: 5.1 g/dL — ABNORMAL LOW (ref 6.5–8.1)

## 2023-11-21 LAB — CBC
HCT: 28.9 % — ABNORMAL LOW (ref 36.0–46.0)
HCT: 28.7 % — ABNORMAL LOW (ref 36.0–46.0)
HCT: 27.3 % — ABNORMAL LOW (ref 36.0–46.0)
Hemoglobin: 9.2 g/dL — ABNORMAL LOW (ref 12.0–15.0)
Hemoglobin: 9.3 g/dL — ABNORMAL LOW (ref 12.0–15.0)
Hemoglobin: 8.6 g/dL — ABNORMAL LOW (ref 12.0–15.0)
MCH: 30.4 pg (ref 26.0–34.0)
MCH: 30.6 pg (ref 26.0–34.0)
MCH: 30.2 pg (ref 26.0–34.0)
MCHC: 31.8 g/dL (ref 30.0–36.0)
MCHC: 32.4 g/dL (ref 30.0–36.0)
MCHC: 31.5 g/dL (ref 30.0–36.0)
MCV: 95.4 fL (ref 80.0–100.0)
MCV: 94.4 fL (ref 80.0–100.0)
MCV: 95.8 fL (ref 80.0–100.0)
Platelets: 178 10*3/uL (ref 150–400)
Platelets: 195 10*3/uL (ref 150–400)
Platelets: 178 10*3/uL (ref 150–400)
RBC: 3.03 MIL/uL — ABNORMAL LOW (ref 3.87–5.11)
RBC: 3.04 MIL/uL — ABNORMAL LOW (ref 3.87–5.11)
RBC: 2.85 MIL/uL — ABNORMAL LOW (ref 3.87–5.11)
RDW: 15.1 % (ref 11.5–15.5)
RDW: 15.3 % (ref 11.5–15.5)
RDW: 15.2 % (ref 11.5–15.5)
WBC: 5.9 10*3/uL (ref 4.0–10.5)
WBC: 6.6 10*3/uL (ref 4.0–10.5)
WBC: 6.8 10*3/uL (ref 4.0–10.5)
nRBC: 0 % (ref 0.0–0.2)
nRBC: 0 % (ref 0.0–0.2)
nRBC: 0 % (ref 0.0–0.2)

## 2023-11-21 NOTE — Care Management Obs Status (Signed)
 MEDICARE OBSERVATION STATUS NOTIFICATION   Patient Details  Name: Briana [name] MRN: [id number] Date of Birth: [birth year]   Medicare Observation Status Notification Given:  Yes    Ronny Bacon, RN 11/21/2023, 8:09 AM

## 2023-11-21 NOTE — Progress Notes (Signed)
 Pt ambulated entire length of unit/hallway twice using walker and tolerated well.

## 2023-11-21 NOTE — Progress Notes (Signed)
 PROGRESS NOTE    [name]  FMW:[id number] DOB: March 04, 1934 DOA: 11/20/2023 PCP: Tonita Fallow, MD   Brief Narrative:  Briana [name] is a 89 y.o. female with medical history significant of hypertension, diabetes, GERD, CKD 3b, atrial fibrillation on AC, chronic diastolic CHF, obesity, diverticulosis presented with bright red blood per rectum which started at 4:30 AM on 11/20/2023.  She had 2 further episodes of rectal bleeding in the ED.  Patient is currently on Xarelto  for atrial fibrillation but last dose was 9 PM 11/19/2023.  Upon arrival to ED, hemodynamically stable as well as hemoglobin is stable however  CT angio GI bleed study was positive for arterial contrast extravasation from diverticula at the sigmoid colon consistent with active diverticular bleeding.  Patient admitted under hospital service, seen by GI, underwent emergent flex sigmoidoscopy, MRI compatible clip was placed at bleeding diverticuli.   Assessment & Plan:   Principal Problem:   Acute GI bleeding Active Problems:   Essential hypertension   Type 2 diabetes mellitus with hyperlipidemia (HCC)   GERD   Obesity (BMI 30.0-34.9)   CKD stage 3 due to type 2 diabetes mellitus (HCC)   Chronic diastolic heart failure (HCC)   Persistent atrial fibrillation (HCC)  Acute blood loss anemia secondary to lower GI bleed/diverticular bleed: Came in with 3 episodes of rectal bleed,  CT angio GI bleed study was positive for arterial contrast extravasation from diverticula at the sigmoid colon consistent with active diverticular bleeding.  seen by GI, underwent emergent flex sigmoidoscopy, MRI compatible clip was placed at bleeding diverticuli.  Hemoglobin dropped from 12.5 upon arrival to 9.4 last night, CBC this morning pending.  Per GI, if she has persistent bleeding, to consult IR for embolization.  Hold anticoagulation.  Paroxysmal atrial fibrillation: Rates lower sometimes in 50s.  Continue amiodarone  but holding diltiazem  and  Xarelto .  Essential hypertension: Diltiazem  and Lasix  on hold.  Blood pressure was low yesterday but improving now.  Type 2 diabetes mellitus: Diet controlled, last hemoglobin A1c 6.5.  CKD stage IIIb: Baseline creatinine around 1.3, currently at baseline.  DVT prophylaxis: SCDs Start: 11/20/23 1313   Code Status: Full Code  Family Communication:  None present at bedside.  Plan of care discussed with patient in length and he/she verbalized understanding and agreed with it.  Status is: Observation The patient will require care spanning > 2 midnights and should be moved to inpatient because: Needs another day of observation.   Estimated body mass index is 24.94 kg/m as calculated from the following:   Height as of this encounter: 5' (1.524 m).   Weight as of this encounter: 57.9 kg.    Nutritional Assessment: Body mass index is 24.94 kg/m.SABRA Seen by dietician.  I agree with the assessment and plan as outlined below: Nutrition Status:        . Skin Assessment: I have examined the patient's skin and I agree with the wound assessment as performed by the wound care RN as outlined below:    Consultants:  GI  Procedures:  Sigmoidoscopy  Antimicrobials:  Anti-infectives (From admission, onward)    None         Subjective: Seen and examined.  Patient has not had any BM since sigmoidoscopy.  She had no complaints.  Objective: Vitals:   11/20/23 1800 11/20/23 1815 11/20/23 1930 11/21/23 0525  BP: (!) 111/40 127/79 (!) 160/83 (!) 125/56  Pulse: 65 63 (!) 56 65  Resp: 16 19 18 17   Temp:  98  F (36.7 C)  98 F (36.7 C)  TempSrc:    Oral  SpO2: 98% 97% 96% 94%  Weight:      Height:        Intake/Output Summary (Last 24 hours) at 11/21/2023 0817 Last data filed at 11/21/2023 0532 Gross per 24 hour  Intake 250 ml  Output 650 ml  Net -400 ml   Filed Weights   11/20/23 0751 11/20/23 1542  Weight: 58.1 kg 57.9 kg    Examination:  General exam: Appears calm and  comfortable  Respiratory system: Clear to auscultation. Respiratory effort normal. Cardiovascular system: S1 & S2 heard, RRR. No JVD, murmurs, rubs, gallops or clicks. No pedal edema. Gastrointestinal system: Abdomen is nondistended, soft and nontender. No organomegaly or masses felt. Normal bowel sounds heard. Central nervous system: Alert and oriented. No focal neurological deficits. Extremities: Symmetric 5 x 5 power. Skin: No rashes, lesions or ulcers Psychiatry: Judgement and insight appear normal. Mood & affect appropriate.    Data Reviewed: I have personally reviewed following labs and imaging studies  CBC: Recent Labs  Lab 11/20/23 0810 11/20/23 1435 11/20/23 2119  WBC 7.4 5.7 6.9  NEUTROABS 5.4  --   --   HGB 12.5 10.5* 9.4*  HCT 39.9 33.7* 29.0*  MCV 95.5 96.0 94.2  PLT 207 189 179   Basic Metabolic Panel: Recent Labs  Lab 11/20/23 0810  NA 139  K 4.1  CL 105  CO2 23  GLUCOSE 204*  BUN 25*  CREATININE 1.33*  CALCIUM 9.7   GFR: Estimated Creatinine Clearance: 23.8 mL/min (A) (by C-G formula based on SCr of 1.33 mg/dL (H)). Liver Function Tests: Recent Labs  Lab 11/20/23 0810  AST 23  ALT 19  ALKPHOS 97  BILITOT 0.9  PROT 6.2*  ALBUMIN  3.9   Recent Labs  Lab 11/20/23 0810  LIPASE 25   No results for input(s): AMMONIA in the last 168 hours. Coagulation Profile: Recent Labs  Lab 11/20/23 1435  INR 1.6*   Cardiac Enzymes: No results for input(s): CKTOTAL, CKMB, CKMBINDEX, TROPONINI in the last 168 hours. BNP (last 3 results) No results for input(s): PROBNP in the last 8760 hours. HbA1C: No results for input(s): HGBA1C in the last 72 hours. CBG: Recent Labs  Lab 11/20/23 1752  GLUCAP 150*   Lipid Profile: No results for input(s): CHOL, HDL, LDLCALC, TRIG, CHOLHDL, LDLDIRECT in the last 72 hours. Thyroid  Function Tests: No results for input(s): TSH, T4TOTAL, FREET4, T3FREE, THYROIDAB in the last 72  hours. Anemia Panel: No results for input(s): VITAMINB12, FOLATE, FERRITIN, TIBC, IRON, RETICCTPCT in the last 72 hours. Sepsis Labs: No results for input(s): PROCALCITON, LATICACIDVEN in the last 168 hours.  No results found for this or any previous visit (from the past 240 hours).   Radiology Studies: CT ANGIO GI BLEED Result Date: 11/20/2023 CLINICAL DATA:  Abdominal pain, rectal bleeding EXAM: CTA ABDOMEN AND PELVIS WITHOUT AND WITH CONTRAST TECHNIQUE: Multidetector CT imaging of the abdomen and pelvis was performed using the standard protocol during bolus administration of intravenous contrast. Multiplanar reconstructed images and MIPs were obtained and reviewed to evaluate the vascular anatomy. RADIATION DOSE REDUCTION: This exam was performed according to the departmental dose-optimization program which includes automated exposure control, adjustment of the mA and/or kV according to patient size and/or use of iterative reconstruction technique. CONTRAST:  75mL OMNIPAQUE  IOHEXOL  350 MG/ML SOLN COMPARISON:  09/15/2022 FINDINGS: VASCULAR Normal contour and caliber of the abdominal aorta. No evidence of aneurysm, dissection, or  other acute aortic pathology. Standard branching pattern of the abdominal aorta with solitary bilateral renal arteries. Moderate mixed calcific atherosclerosis. Review of the MIP images confirms the above findings. NON-VASCULAR Lower Chest: No acute findings. Moderate hiatal hernia with intrathoracic position of the gastric fundus. Cardiomegaly. Hepatobiliary: No solid liver abnormality is seen. No gallstones, gallbladder wall thickening, or biliary dilatation. Pancreas: Unremarkable. No pancreatic ductal dilatation or surrounding inflammatory changes. Spleen: Normal in size without significant abnormality. Adrenals/Urinary Tract: Benign, macroscopic fat containing left adrenal adenoma, for which no further follow-up or characterization is required. Multiple  simple, benign bilateral renal cortical cysts, including a large cyst arising from the inferior pole of the left kidney, requiring no specific further follow-up or characterization. Kidneys are otherwise normal, without renal calculi, solid lesion, or hydronephrosis. Bladder is unremarkable. Stomach/Bowel: Stomach is within normal limits. Appendix appears normal. No evidence of bowel wall thickening, distention, or inflammatory changes. Descending and sigmoid diverticulosis without evidence of acute diverticulitis. Arterial contrast extravasation arising from a prominent diverticulum in the mid sigmoid (series 8, image 136). Lymphatic: No enlarged abdominal or pelvic lymph nodes. Reproductive: No mass or other significant abnormality. Other: Right inguinal hernia containing nonobstructed distal small bowel (series 8, image 172). No ascites. Musculoskeletal: No acute osseous findings. Status post left hip total arthroplasty. IMPRESSION: 1. Arterial contrast extravasation arising from a prominent diverticulum in the mid sigmoid, consistent with active diverticular bleeding. 2. Descending and sigmoid diverticulosis without evidence of acute diverticulitis. 3. Normal contour and caliber of the abdominal aorta. No evidence of aneurysm, dissection, or other acute aortic pathology. Moderate mixed calcific atherosclerosis. 4. Right inguinal hernia containing nonobstructed distal small bowel. Electronically Signed   By: Marolyn JONETTA Jaksch M.D.   On: 11/20/2023 12:49    Scheduled Meds:  amiodarone   200 mg Oral Daily   sodium chloride  flush  3 mL Intravenous Q12H   Continuous Infusions:   LOS: 0 days   Fredia Skeeter, MD [facility]  11/21/2023, 8:17 AM   *Please note that this is a verbal dictation therefore any spelling or grammatical errors are due to the Dragon Medical One system interpretation.  Please page via Amion and do not message via secure chat for urgent patient care matters. Secure chat can be  used for non urgent patient care matters.  How to contact the [facility] Attending or Consulting provider 7A - 7P or covering provider during after hours 7P -7A, for this patient?  Check the care team in [facility] and look for a) attending/consulting [facility] provider listed and b) the [facility] team listed. Page or secure chat 7A-7P. Log into [email or web address] and use [facility] universal password to access. If you do not have the password, please contact the hospital operator. Locate the [facility] provider you are looking for under [facility] and page to a number that you can be directly reached. If you still have difficulty reaching the provider, please page the [facility] (Director on Call) for the Hospitalists listed on amion for assistance.

## 2023-11-21 NOTE — Evaluation (Addendum)
 Physical Therapy Evaluation Patient Details Name: Briana [name] MRN: [id number] DOB: 08/27/1934 Today's Date: 11/21/2023  History of Present Illness  Pt is an 89 y.o. female who presented 11/20/23 with rectal bleeding. PMH - HTN, DM, CKD 3, CHF, afib, GERD, cancer, heart murmur, HLD, osteoporosis, occasional tremors   Clinical Impression  Pt presents with condition above and deficits mentioned below, see PT Problem List. PTA, she was independent without DME for functional mobility, except intermittent use of cane outside. She lives alone in a 1-level house with 2 STE. Currently, pt demonstrates deficits in balance and activity tolerance, primarily limited by her fatigue/dizziness with standing mobility, seemingly due to her orthostatic hypotension, see General Comments below. She is currently requiring CGA to ambulate with a RW. She will likely progress well once her BP improves, thus do not anticipate post-acute PT needs, but will plan to follow acutely to maximize her return to baseline prior to d/c home. She requested HHOT to address her R UE issues.     If plan is discharge home, recommend the following: Assistance with cooking/housework;Help with stairs or ramp for entrance   Can travel by private vehicle        Equipment Recommendations Rollator (4 wheels)  Recommendations for Other Services       Functional Status Assessment Patient has had a recent decline in their functional status and demonstrates the ability to make significant improvements in function in a reasonable and predictable amount of time.     Precautions / Restrictions Precautions Precautions: Fall;Other (comment) Precaution Comments: watch BP Restrictions Weight Bearing Restrictions Per Provider Order: No      Mobility  Bed Mobility               General bed mobility comments: Pt sitting in chair upon arrival and at end of session    Transfers Overall transfer level: Needs assistance Equipment used:  None, Rolling walker (2 wheels) Transfers: Sit to/from Stand Sit to Stand: Contact guard assist           General transfer comment: CGA for safety    Ambulation/Gait Ambulation/Gait assistance: Contact guard assist, Min assist Gait Distance (Feet): 100 Feet Assistive device: Rolling walker (2 wheels), 1 person hand held assist Gait Pattern/deviations: Step-through pattern, Decreased stride length, Trunk flexed Gait velocity: reduced Gait velocity interpretation: <1.8 ft/sec, indicate of risk for recurrent falls   General Gait Details: Pt initially ambulating with x1 HHA and minA for balance. Transitioned pt to RW, improved balance noted, CGA for safety, no LOB  Stairs            Wheelchair Mobility     Tilt Bed    Modified Rankin (Stroke Patients Only)       Balance Overall balance assessment: Needs assistance Sitting-balance support: No upper extremity supported, Feet supported Sitting balance-Leahy Scale: Good     Standing balance support: Single extremity supported, Bilateral upper extremity supported, During functional activity, Reliant on assistive device for balance Standing balance-Leahy Scale: Poor Standing balance comment: Reliant on UE support                             Pertinent Vitals/Pain Pain Assessment Pain Assessment: Faces Faces Pain Scale: Hurts a little bit Pain Location: L arm with BP cuff reading Pain Descriptors / Indicators: Discomfort Pain Intervention(s): Monitored during session, Limited activity within patient's tolerance    Home Living Family/patient expects to be discharged to:: Private residence Living  Arrangements: Alone Available Help at Discharge: Neighbor;Family;Available PRN/intermittently Type of Home: House Home Access: Stairs to enter Entrance Stairs-Rails: Right Entrance Stairs-Number of Steps: 2 (book doors)   Home Layout: One level Home Equipment: Other (comment);Grab bars - tub/shower;Hand held  shower head;BSC/3in1;Rolling Walker (2 wheels);Wheelchair - manual;Cane - single point (non-skid mat in tub)      Prior Function Prior Level of Function : Independent/Modified Independent             Mobility Comments: Intermittent use of SPC intermittently outdoors, no AD in home; 1 fall in the past 6 months ADLs Comments: Independent, drives     Extremity/Trunk Assessment   Upper Extremity Assessment Upper Extremity Assessment: Defer to OT evaluation    Lower Extremity Assessment Lower Extremity Assessment: Overall WFL for tasks assessed    Cervical / Trunk Assessment Cervical / Trunk Assessment: Normal  Communication   Communication Communication: Hearing impairment (has hearing aides)  Cognition Arousal: Alert Behavior During Therapy: WFL for tasks assessed/performed Overall Cognitive Status: Within Functional Limits for tasks assessed                                          General Comments General comments (skin integrity, edema, etc.): BP 124/66 sitting, 101/81 standing, 117/65 sitting after ambulating, 121/68 sitting end of session, *pt lightheaded with standing mobility, improved when ambulating at times though; educated pt and daughter on safe use of rollator and on changing positions slowly and performing AROM of extremities to try to manage drop in BP, they verbalized understanding    Exercises     Assessment/Plan    PT Assessment Patient needs continued PT services  PT Problem List Decreased activity tolerance;Decreased balance;Decreased mobility;Cardiopulmonary status limiting activity       PT Treatment Interventions DME instruction;Gait training;Stair training;Functional mobility training;Therapeutic activities;Therapeutic exercise;Balance training;Patient/family education;Neuromuscular re-education    PT Goals (Current goals can be found in the Care Plan section)  Acute Rehab PT Goals Patient Stated Goal: to improve PT Goal  Formulation: With patient/family Time For Goal Achievement: 12/05/23 Potential to Achieve Goals: Good    Frequency Min 1X/week     Co-evaluation               AM-PAC PT 6 Clicks Mobility  Outcome Measure Help needed turning from your back to your side while in a flat bed without using bedrails?: A Little Help needed moving from lying on your back to sitting on the side of a flat bed without using bedrails?: A Little Help needed moving to and from a bed to a chair (including a wheelchair)?: A Little Help needed standing up from a chair using your arms (e.g., wheelchair or bedside chair)?: A Little Help needed to walk in hospital room?: A Little Help needed climbing 3-5 steps with a railing? : A Little 6 Click Score: 18    End of Session Equipment Utilized During Treatment: Gait belt Activity Tolerance: Patient tolerated treatment well Patient left: in chair;with call bell/phone within reach;with family/visitor present Nurse Communication: Other (comment) (cardiac monitor battery dead - NT) PT Visit Diagnosis: Unsteadiness on feet (R26.81);Other abnormalities of gait and mobility (R26.89);Dizziness and giddiness (R42)    Time: 8352-8290 PT Time Calculation (min) (ACUTE ONLY): 22 min   Charges:   PT Evaluation $PT Eval Low Complexity: 1 Low   PT General Charges $$ ACUTE PT VISIT: 1 Visit  Theo Ferretti, PT, DPT Acute Rehabilitation Services  Office: [phone]   Theo CHRISTELLA Ferretti 11/21/2023, 5:24 PM

## 2023-11-21 NOTE — Progress Notes (Signed)
 Subjective: No further bleeding overnight.  Objective: Vital signs in last 24 hours: Temp:  [97.7 F (36.5 C)-98 F (36.7 C)] 98 F (36.7 C) (01/05 0525) Pulse Rate:  [55-77] 65 (01/05 0525) Resp:  [13-19] 17 (01/05 0525) BP: (88-160)/(40-83) 125/56 (01/05 0525) SpO2:  [94 %-100 %] 94 % (01/05 0525) Weight:  [57.9 kg] 57.9 kg (01/04 1542) Last BM Date : 11/20/23  Intake/Output from previous day: 01/04 0701 - 01/05 0700 In: 250 [I.V.:250] Out: 650 [Urine:650] Intake/Output this shift: No intake/output data recorded.  General appearance: alert and no distress GI: soft, non-tender; bowel sounds normal; no masses,  no organomegaly  Lab Results: Recent Labs    11/20/23 0810 11/20/23 1435 11/20/23 2119  WBC 7.4 5.7 6.9  HGB 12.5 10.5* 9.4*  HCT 39.9 33.7* 29.0*  PLT 207 189 179   BMET Recent Labs    11/20/23 0810  NA 139  K 4.1  CL 105  CO2 23  GLUCOSE 204*  BUN 25*  CREATININE 1.33*  CALCIUM 9.7   LFT Recent Labs    11/20/23 0810  PROT 6.2*  ALBUMIN  3.9  AST 23  ALT 19  ALKPHOS 97  BILITOT 0.9   PT/INR Recent Labs    11/20/23 1435  LABPROT 19.6*  INR 1.6*   Hepatitis Panel No results for input(s): HEPBSAG, HCVAB, HEPAIGM, HEPBIGM in the last 72 hours. C-Diff No results for input(s): CDIFFTOX in the last 72 hours. Fecal Lactopherrin No results for input(s): FECLLACTOFRN in the last 72 hours.  Studies/Results: CT ANGIO GI BLEED Result Date: 11/20/2023 CLINICAL DATA:  Abdominal pain, rectal bleeding EXAM: CTA ABDOMEN AND PELVIS WITHOUT AND WITH CONTRAST TECHNIQUE: Multidetector CT imaging of the abdomen and pelvis was performed using the standard protocol during bolus administration of intravenous contrast. Multiplanar reconstructed images and MIPs were obtained and reviewed to evaluate the vascular anatomy. RADIATION DOSE REDUCTION: This exam was performed according to the departmental dose-optimization program which includes automated  exposure control, adjustment of the mA and/or kV according to patient size and/or use of iterative reconstruction technique. CONTRAST:  75mL OMNIPAQUE  IOHEXOL  350 MG/ML SOLN COMPARISON:  09/15/2022 FINDINGS: VASCULAR Normal contour and caliber of the abdominal aorta. No evidence of aneurysm, dissection, or other acute aortic pathology. Standard branching pattern of the abdominal aorta with solitary bilateral renal arteries. Moderate mixed calcific atherosclerosis. Review of the MIP images confirms the above findings. NON-VASCULAR Lower Chest: No acute findings. Moderate hiatal hernia with intrathoracic position of the gastric fundus. Cardiomegaly. Hepatobiliary: No solid liver abnormality is seen. No gallstones, gallbladder wall thickening, or biliary dilatation. Pancreas: Unremarkable. No pancreatic ductal dilatation or surrounding inflammatory changes. Spleen: Normal in size without significant abnormality. Adrenals/Urinary Tract: Benign, macroscopic fat containing left adrenal adenoma, for which no further follow-up or characterization is required. Multiple simple, benign bilateral renal cortical cysts, including a large cyst arising from the inferior pole of the left kidney, requiring no specific further follow-up or characterization. Kidneys are otherwise normal, without renal calculi, solid lesion, or hydronephrosis. Bladder is unremarkable. Stomach/Bowel: Stomach is within normal limits. Appendix appears normal. No evidence of bowel wall thickening, distention, or inflammatory changes. Descending and sigmoid diverticulosis without evidence of acute diverticulitis. Arterial contrast extravasation arising from a prominent diverticulum in the mid sigmoid (series 8, image 136). Lymphatic: No enlarged abdominal or pelvic lymph nodes. Reproductive: No mass or other significant abnormality. Other: Right inguinal hernia containing nonobstructed distal small bowel (series 8, image 172). No ascites. Musculoskeletal: No  acute osseous findings.  Status post left hip total arthroplasty. IMPRESSION: 1. Arterial contrast extravasation arising from a prominent diverticulum in the mid sigmoid, consistent with active diverticular bleeding. 2. Descending and sigmoid diverticulosis without evidence of acute diverticulitis. 3. Normal contour and caliber of the abdominal aorta. No evidence of aneurysm, dissection, or other acute aortic pathology. Moderate mixed calcific atherosclerosis. 4. Right inguinal hernia containing nonobstructed distal small bowel. Electronically Signed   By: Marolyn JONETTA Jaksch M.D.   On: 11/20/2023 12:49    Medications: Scheduled:  amiodarone   200 mg Oral Daily   sodium chloride  flush  3 mL Intravenous Q12H   Continuous:  Assessment/Plan: 1) Diverticular bleed. 2) Anemia.   She is relatively stable.  No overt bleeding overnight.  If she does not have any further bleeding in 24 hours she can be discharged home.  Plan: 1) Follow HGB and transfuse as necessary. 2)  GI will assume care in the AM.  LOS: 0 days   Alta Goding D 11/21/2023, 8:31 AM

## 2023-11-21 NOTE — Evaluation (Signed)
 Occupational Therapy Evaluation Patient Details Name: Briana [name] MRN: [id number] DOB: 04-10-1934 Today's Date: 11/21/2023   History of Present Illness Pt is an 89 y.o. female who presented 11/20/23 with rectal bleeding. PMH - HTN, DM, CKD 3, CHF, afib, GERD, cancer, heart murmur, HLD, osteoporosis, occasional tremors   Clinical Impression   At baseline, pt is Independent to Mod I with ADLs, IADL, and functional mobility/transfers with intermittent use of SPC and drives. Pt reports one fall in past 6 months with injury to upper R arm. Pt now presents with decreased activity tolerance, episode of orthostatic hypotension affecting functional level (see additional details in General Comments section below), decreased balance, R UE generalized weakness, mild pain in R UE with shoulder abduction, and decreased safety and independence with functional tasks. Pt currently demonstrates ability to complete UB ADLs Independent to Supervision in sitting, LB ADLs with Supervision to Contact guard assist for safety in sit/stand, and functional transfers with a RW with Contact guard assist for safety. Pt participated well in session and is motivated to return to PLOF. Pt will benefit from acute skilled OT services to address deficits outlined below, for additional education in recognizing/managing episodes of orthostatic hypotension, for education in energy conservation strategies, and to increase safety and independence with functional tasks. Post acute discharge, pt will benefit from continued skilled OT services in the home to maximize rehab potential.       If plan is discharge home, recommend the following: A little help with walking and/or transfers;A little help with bathing/dressing/bathroom;Assistance with cooking/housework;Assist for transportation;Help with stairs or ramp for entrance    Functional Status Assessment  Patient has had a recent decline in their functional status and demonstrates the ability  to make significant improvements in function in a reasonable and predictable amount of time.  Equipment Recommendations  None recommended by OT (Pt already has needed equipment)    Recommendations for Other Services       Precautions / Restrictions Precautions Precautions: Fall;Other (comment) Precaution Comments: watch BP Restrictions Weight Bearing Restrictions Per Provider Order: No      Mobility Bed Mobility Overal bed mobility: Needs Assistance Bed Mobility: Supine to Sit     Supine to sit: Supervision, HOB elevated, Used rails     General bed mobility comments: with increased time    Transfers Overall transfer level: Needs assistance Equipment used: Rolling walker (2 wheels) Transfers: Sit to/from Stand, Bed to chair/wheelchair/BSC Sit to Stand: Contact guard assist     Step pivot transfers: Contact guard assist (with RW)            Balance Overall balance assessment: Needs assistance Sitting-balance support: No upper extremity supported, Feet supported Sitting balance-Leahy Scale: Good     Standing balance support: Single extremity supported, Bilateral upper extremity supported, During functional activity, Reliant on assistive device for balance Standing balance-Leahy Scale: Poor                             ADL either performed or assessed with clinical judgement   ADL Overall ADL's : Needs assistance/impaired Eating/Feeding: Independent;Sitting   Grooming: Set up;Sitting   Upper Body Bathing: Supervision/ safety;Sitting   Lower Body Bathing: Contact guard assist;Sitting/lateral leans;Sit to/from stand   Upper Body Dressing : Set up;Sitting   Lower Body Dressing: Supervision/safety;Contact guard assist;Sitting/lateral leans;Sit to/from stand   Toilet Transfer: Contact guard assist;Rolling walker (2 wheels);BSC/3in1 (step-pivot transfer)   Toileting- Clothing Manipulation and Hygiene: Contact  guard assist;Sitting/lateral lean;Sit  to/from stand       Functional mobility during ADLs:  (deferred this session due to episode of orthostatic hypotension) General ADL Comments: Pt with decreased activity tolerance and fatiguing quickly. Pt also with episode of orthostatic hypotension in standing this session. limiting functional level. Due to this, OT recommends CGA for safety in funcitonal tasks in standing.     Vision Baseline Vision/History: 1 Wears glasses Ability to See in Adequate Light: 0 Adequate (with glasses) Patient Visual Report: No change from baseline       Perception         Praxis         Pertinent Vitals/Pain Pain Assessment Pain Assessment: Faces Faces Pain Scale: Hurts a little bit Pain Location: L lower abdomen; R upper arm with activity Pain Descriptors / Indicators: Aching Pain Intervention(s): Limited activity within patient's tolerance, Monitored during session, Repositioned     Extremity/Trunk Assessment Upper Extremity Assessment Upper Extremity Assessment: Right hand dominant;Generalized weakness;RUE deficits/detail;Overall [facility] for tasks assessed (L UE strength, ROM, and coordination all WNL) RUE Deficits / Details: Generalized weakness. Pt reports injury to R upper arm sustained in fall in October 2024. Pt and daughter report she was told to wear a sling for a few weeks and avoid lifting heavy thigs. Also report HH OT was supposed to start at that time but no one came out to pt's house. Pt now with AROM and coordination WFL and pt reporting mild pain in upper arm with shoulder abduction. RUE: Shoulder pain with ROM RUE Sensation: WNL RUE Coordination: WNL   Lower Extremity Assessment Lower Extremity Assessment: Defer to PT evaluation   Cervical / Trunk Assessment Cervical / Trunk Assessment: Normal   Communication Communication Communication: Hearing impairment (has hearing aides)   Cognition Arousal: Alert Behavior During Therapy: WFL for tasks assessed/performed Overall  Cognitive Status: Within Functional Limits for tasks assessed                                 General Comments: AAOx4 and pleasant throughout session. Pt able to follow multi-step instructions and demonstrates good safety awareness.     General Comments  Pt with episode of orthostatic hypotension during session. Pt BP in supine was 112/72, in sitting was 127/74, 94/55 in standing, 87/66 in standing at 3 minutes, and 127/79 after returning to sitting. Pt reported no dizziness orlightheadedness, but did report feeling of increased fatigue at time of episode of orthostatic hypotension. Pt's HR between 68 to 91 bpm and O2 >/95% on RA throughout session. OT educated pt and her daughter in possible signs/symptoms of hypotension and technqiues for responding to/managing episodes of orthostatic hypotention with both verbalizing understanding. Pt's daughter present through majority of session.    Exercises     Shoulder Instructions      Home Living Family/patient expects to be discharged to:: Private residence Living Arrangements: Alone Available Help at Discharge: Neighbor;Family;Available PRN/intermittently Type of Home: House Home Access: Stairs to enter [employer] of Steps: 2 (book doors) Entrance Stairs-Rails: Right Home Layout: One level     Bathroom Shower/Tub: Tub/shower unit;Walk-in shower (uses tub/shower)   Teacher, Early Years/pre: Yes How Accessible: Accessible via walker Home Equipment: Other (comment);Grab bars - tub/shower;Hand held shower head;BSC/3in1;Rolling Walker (2 wheels);Wheelchair - manual;Cane - single point (non-skid mat in tub)          Prior Functioning/Environment Prior Level of Function : Independent/Modified  Independent             Mobility Comments: Intermittent use of SPC; 1 fall in the past 6 months ADLs Comments: Independent, drives        OT Problem List: Decreased strength;Decreased activity  tolerance;Impaired balance (sitting and/or standing);Decreased knowledge of use of DME or AE;Cardiopulmonary status limiting activity      OT Treatment/Interventions: Self-care/ADL training;Therapeutic exercise;Energy conservation;DME and/or AE instruction;Therapeutic activities;Patient/family education;Balance training    OT Goals(Current goals can be found in the care plan section) Acute Rehab OT Goals Patient Stated Goal: to return home and remain independent OT Goal Formulation: With patient/family Time For Goal Achievement: 12/05/23 Potential to Achieve Goals: Good ADL Goals Pt Will Perform Lower Body Bathing: with supervision;sit to/from stand;sitting/lateral leans Pt Will Perform Lower Body Dressing: with modified independence;sitting/lateral leans;sit to/from stand Pt Will Transfer to Toilet: with modified independence;regular height toilet;ambulating (with least restrictive AD) Pt Will Perform Toileting - Clothing Manipulation and hygiene: with modified independence;sit to/from stand;sitting/lateral leans Pt/caregiver will Perform Home Exercise Program: Right Upper extremity;Increased strength;With Supervision;With written HEP provided;With theraputty (AROM progressing to theraband) Additional ADL Goal #1: Patient will demonstrate understanding of education related to recognizing, tracking, and approiately responding to episodes of orthostatic hypotension through teach back to increase safety and independence with functional tasks. Additional ADL Goal #2: Patient will demonstrate ablity to Independent state 4 energy conservation strategies to increase safety and independenceduring functional tasks.  OT Frequency: Min 1X/week    Co-evaluation              AM-PAC OT 6 Clicks Daily Activity     Outcome Measure Help from another person eating meals?: None Help from another person taking care of personal grooming?: A Little Help from another person toileting, which includes using  toliet, bedpan, or urinal?: A Little Help from another person bathing (including washing, rinsing, drying)?: A Little Help from another person to put on and taking off regular upper body clothing?: A Little Help from another person to put on and taking off regular lower body clothing?: A Little 6 Click Score: 19   End of Session Equipment Utilized During Treatment: Gait belt;Rolling walker (2 wheels) Nurse Communication: Mobility status;Other (comment) (Pt with episode of orthostatic hypotension during session.)  Activity Tolerance: Patient tolerated treatment well;Treatment limited secondary to medical complications (Comment) (episode of orthostatic hypotension) Patient left: in chair;with call bell/phone within reach;with family/visitor present  OT Visit Diagnosis: Unsteadiness on feet (R26.81);Other abnormalities of gait and mobility (R26.89);Muscle weakness (generalized) (M62.81)                Time: 8880-8778 OT Time Calculation (min): 62 min Charges:  OT General Charges $OT Visit: 1 Visit OT Evaluation $OT Eval Low Complexity: 1 Low OT Treatments $Self Care/Home Management : 23-37 mins $Therapeutic Activity: 8-22 mins  Briana [name] HERO., OTR/L, MA Acute Rehab [phone]   Briana [name] 11/21/2023, 5:44 PM

## 2023-11-22 DIAGNOSIS — K922 Gastrointestinal hemorrhage, unspecified: Secondary | ICD-10-CM | POA: Diagnosis not present

## 2023-11-22 LAB — CBC WITH DIFFERENTIAL/PLATELET
Abs Immature Granulocytes: 0.08 10*3/uL — ABNORMAL HIGH (ref 0.00–0.07)
Basophils Absolute: 0.1 10*3/uL (ref 0.0–0.1)
Basophils Relative: 1 %
Eosinophils Absolute: 0.2 10*3/uL (ref 0.0–0.5)
Eosinophils Relative: 3 %
HCT: 26.7 % — ABNORMAL LOW (ref 36.0–46.0)
Hemoglobin: 8.6 g/dL — ABNORMAL LOW (ref 12.0–15.0)
Immature Granulocytes: 1 %
Lymphocytes Relative: 21 %
Lymphs Abs: 1.4 10*3/uL (ref 0.7–4.0)
MCH: 30.4 pg (ref 26.0–34.0)
MCHC: 32.2 g/dL (ref 30.0–36.0)
MCV: 94.3 fL (ref 80.0–100.0)
Monocytes Absolute: 0.7 10*3/uL (ref 0.1–1.0)
Monocytes Relative: 11 %
Neutro Abs: 4.2 10*3/uL (ref 1.7–7.7)
Neutrophils Relative %: 63 %
Platelets: 179 10*3/uL (ref 150–400)
RBC: 2.83 MIL/uL — ABNORMAL LOW (ref 3.87–5.11)
RDW: 15.2 % (ref 11.5–15.5)
WBC: 6.6 10*3/uL (ref 4.0–10.5)
nRBC: 0 % (ref 0.0–0.2)

## 2023-11-22 LAB — BASIC METABOLIC PANEL
Anion gap: 7 (ref 5–15)
BUN: 25 mg/dL — ABNORMAL HIGH (ref 8–23)
CO2: 23 mmol/L (ref 22–32)
Calcium: 9.2 mg/dL (ref 8.9–10.3)
Chloride: 105 mmol/L (ref 98–111)
Creatinine, Ser: 1.58 mg/dL — ABNORMAL HIGH (ref 0.44–1.00)
GFR, Estimated: 31 mL/min — ABNORMAL LOW (ref >60)
Glucose, Bld: 130 mg/dL — ABNORMAL HIGH (ref 70–99)
Potassium: 4.2 mmol/L (ref 3.5–5.1)
Sodium: 135 mmol/L (ref 135–145)

## 2023-11-22 MED ORDER — SODIUM CHLORIDE 0.9 % IV SOLN: INTRAVENOUS | Status: DC

## 2023-11-22 MED ORDER — RIVAROXABAN 15 MG PO TABS: 15.0000 mg | ORAL_TABLET | Freq: Every day | ORAL | Status: DC

## 2023-11-22 NOTE — Plan of Care (Signed)
  Problem: Education: Goal: Knowledge of General Education information will improve Description: Including pain rating scale, medication(s)/side effects and non-pharmacologic comfort measures Outcome: Adequate for Discharge   Problem: Health Behavior/Discharge Planning: Goal: Ability to manage health-related needs will improve Outcome: Adequate for Discharge   Problem: Clinical Measurements: Goal: Ability to maintain clinical measurements within normal limits will improve Outcome: Adequate for Discharge Goal: Will remain free from infection Outcome: Adequate for Discharge Goal: Diagnostic test results will improve Outcome: Adequate for Discharge Goal: Respiratory complications will improve Outcome: Adequate for Discharge Goal: Cardiovascular complication will be avoided Outcome: Adequate for Discharge   Problem: Activity: Goal: Risk for activity intolerance will decrease Outcome: Adequate for Discharge   Problem: Nutrition: Goal: Adequate nutrition will be maintained Outcome: Adequate for Discharge   Problem: Coping: Goal: Level of anxiety will decrease Outcome: Adequate for Discharge   Problem: Elimination: Goal: Will not experience complications related to bowel motility Outcome: Adequate for Discharge Goal: Will not experience complications related to urinary retention Outcome: Adequate for Discharge   Problem: Pain Management: Goal: General experience of comfort will improve Outcome: Adequate for Discharge   Problem: Safety: Goal: Ability to remain free from injury will improve Outcome: Adequate for Discharge   Problem: Skin Integrity: Goal: Risk for impaired skin integrity will decrease Outcome: Adequate for Discharge   Problem: Acute Rehab PT Goals(only PT should resolve) Goal: Patient Will Transfer Sit To/From Stand Outcome: Adequate for Discharge Goal: Pt Will Ambulate Outcome: Adequate for Discharge Goal: Pt Will Go Up/Down Stairs Outcome: Adequate  for Discharge   Problem: Acute Rehab OT Goals (only OT should resolve) Goal: Pt. Will Perform Lower Body Bathing Outcome: Adequate for Discharge Goal: Pt. Will Perform Lower Body Dressing Outcome: Adequate for Discharge Goal: Pt. Will Transfer To Toilet Outcome: Adequate for Discharge Goal: Pt. Will Perform Toileting-Clothing Manipulation Outcome: Adequate for Discharge Goal: Pt/Caregiver Will Perform Home Exercise Program Outcome: Adequate for Discharge Goal: OT Additional ADL Goal #1 Outcome: Adequate for Discharge Goal: OT Additional ADL Goal #2 Outcome: Adequate for Discharge

## 2023-11-22 NOTE — Discharge Summary (Signed)
 Physician Discharge Summary  Briana [name] FMW:[id number] DOB: 09-Nov-1934 DOA: 11/20/2023  PCP: Tonita Fallow, MD  Admit date: 11/20/2023 Discharge date: 11/22/2023 30 Day Unplanned Readmission Risk Score    Flowsheet Row ED to Hosp-Admission (Current) from 11/20/2023 in [facility]  30 Day Unplanned Readmission Risk Score (%) 14.08 Filed at 11/22/2023 1200       This score is the patient's risk of an unplanned readmission within 30 days of being discharged (0 -100%). The score is based on dignosis, age, lab data, medications, orders, and past utilization.   Low:  0-14.9   Medium: 15-21.9   High: 22-29.9   Extreme: 30 and above          Admitted From: Home Disposition: Home  Recommendations for Outpatient Follow-up:  Follow up with PCP in 1-2 weeks Please obtain BMP/CBC in one week Please follow up with your PCP on the following pending results: Unresulted Labs (From admission, onward)    None         Home Health: None  Equipment/Devices: None  Discharge Condition: Stable CODE STATUS: Full code Diet recommendation: Cardiac  Subjective: Seen and examined.  No complaints.  She had 1 bowel movement this morning which she says was a little pink with nothing else in it.  No bright red blood.  She has no other complaint.  She feels comfortable going home.  Brief/Interim Summary: Briana [name] is a 89 y.o. female with medical history significant of hypertension, diabetes, GERD, CKD 3b, atrial fibrillation on AC, chronic diastolic CHF, obesity, diverticulosis presented with bright red blood per rectum which started at 4:30 AM on 11/20/2023.  She had 2 further episodes of rectal bleeding in the ED.  Patient is currently on Xarelto  for atrial fibrillation but last dose was 9 PM 11/19/2023.  Upon arrival to ED, hemodynamically stable as well as hemoglobin is stable however CT angio GI bleed study was positive for arterial contrast extravasation from diverticula at the  sigmoid colon consistent with active diverticular bleeding.  Patient admitted under hospital service for acute blood loss anemia secondary to lower GI bleed/diverticular bleed, seen by GI, underwent emergent flex sigmoidoscopy, MRI compatible clip was placed at bleeding diverticuli.  Patient's hemoglobin dropped from 12.5 upon arrival to only 8.6, she did not require any blood transfusion during this hospitalization.  She has not had any further rectal bleeding, hemodynamically stable, she will be discharged home today as she has been cleared by GI to go home as well.  Since she is old and had recent GI bleed and in order to prevent further GI bleed, I have advised her to resume her Xarelto  on 11/25/2023, that we will give her 5 days of break since the last dose.   Paroxysmal atrial fibrillation: Rates lower sometimes in 50s.  Continue amiodarone , diltiazem  and Eliquis  as mentioned above about Eliquis .   Essential hypertension: Resume home medications.  Type 2 diabetes mellitus: Diet controlled, last hemoglobin A1c 6.5.   CKD stage IIIb: Baseline creatinine around 1.3, currently at baseline.    Discharge plan was discussed with patient and/or family member and they verbalized understanding and agreed with it.  Discharge Diagnoses:  Principal Problem:   Acute GI bleeding Active Problems:   Essential hypertension   Type 2 diabetes mellitus with hyperlipidemia (HCC)   GERD   Obesity (BMI 30.0-34.9)   CKD stage 3 due to type 2 diabetes mellitus (HCC)   Chronic diastolic heart failure (HCC)   Persistent atrial  fibrillation (HCC)   Diverticulosis of intestine with bleeding    Discharge Instructions   Allergies as of 11/22/2023       Reactions   Codeine Rash   Accupril [quinapril Hcl] Cough   Prednisone     Agitated and shaky   Reglan [metoclopramide] Other (See Comments)   tremors   Tramadol  Nausea And Vomiting   Adhesive [tape] Rash   Neosporin [neomycin-bacitracin Zn-polymyx] Itching,  Rash        Medication List     TAKE these medications    acetaminophen  500 MG tablet Commonly known as: TYLENOL  Take 500 mg by mouth every 6 (six) hours as needed for moderate pain.   albuterol  108 (90 Base) MCG/ACT inhaler Commonly known as: ProAir  HFA Take  2 inhalations  15 minutes apart  every 4 hours  as needed for Wheezing   amiodarone  200 MG tablet Commonly known as: PACERONE  Take 1 tablet by mouth once daily   B-12 PO Take 1 capsule by mouth daily.   B-6 PO Take 1 tablet by mouth daily.   cholecalciferol  25 MCG (1000 UNIT) tablet Commonly known as: VITAMIN D3 Take 3,000 Units by mouth daily.   diazepam  2 MG tablet Commonly known as: VALIUM  Take 1 tablet 3 - 4 x /day for Tremor                                     /                                 TAKE                    BY                     MOUTH   diltiazem  180 MG 24 hr capsule Commonly known as: CARDIZEM  CD Take 1 capsule  Daily for Heart & BP                                                                                                   /                          TAKE                                      BY                                            MOUTH  ONCE ? DAILY What changed:  how much to take how to take this when to take this additional instructions   furosemide  40 MG tablet Commonly known as: Lasix  Take 1 tablet (40 mg total) by mouth daily.   GAS-X PO Take 1 tablet by mouth daily as needed (gas).   loratadine 10 MG tablet Commonly known as: CLARITIN Take 10 mg by mouth daily as needed for allergies.   OSTEO BI-FLEX ONE PER DAY PO Take 1 tablet by mouth in the morning.   polyethylene glycol 17 g packet Commonly known as: MIRALAX  / GLYCOLAX  Take 17 g by mouth daily. What changed:  when to take this reasons to take this   REFRESH DIGITAL OP Place 1 drop into the right eye daily as needed (dry eyes).   Rivaroxaban  15 MG Tabs  tablet Commonly known as: Xarelto  Take 1 tablet (15 mg total) by mouth daily with supper. Start taking on: November 25, 2023 What changed:  See the new instructions. These instructions start on November 25, 2023. If you are unsure what to do until then, ask your doctor or other care provider.   SLOW RELEASE IRON PO Take 1 tablet by mouth at bedtime.   VITAMIN C  PO Take 1 tablet by mouth at bedtime.   ZINC PO Take 1 capsule by mouth daily.        Follow-up Information     Tonita Fallow, MD Follow up in 1 week(s).   Specialty: Internal Medicine Contact information: [address] [place] [address] [phone]                Allergies  Allergen Reactions   Codeine Rash   Accupril [Quinapril Hcl] Cough   Prednisone      Agitated and shaky   Reglan [Metoclopramide] Other (See Comments)    tremors   Tramadol  Nausea And Vomiting   Adhesive [Tape] Rash   Neosporin [Neomycin-Bacitracin Zn-Polymyx] Itching and Rash    Consultations: GI   Procedures/Studies: CT ANGIO GI BLEED Result Date: 11/20/2023 CLINICAL DATA:  Abdominal pain, rectal bleeding EXAM: CTA ABDOMEN AND PELVIS WITHOUT AND WITH CONTRAST TECHNIQUE: Multidetector CT imaging of the abdomen and pelvis was performed using the standard protocol during bolus administration of intravenous contrast. Multiplanar reconstructed images and MIPs were obtained and reviewed to evaluate the vascular anatomy. RADIATION DOSE REDUCTION: This exam was performed according to the departmental dose-optimization program which includes automated exposure control, adjustment of the mA and/or kV according to patient size and/or use of iterative reconstruction technique. CONTRAST:  75mL OMNIPAQUE  IOHEXOL  350 MG/ML SOLN COMPARISON:  09/15/2022 FINDINGS: VASCULAR Normal contour and caliber of the abdominal aorta. No evidence of aneurysm, dissection, or other acute aortic pathology. Standard branching pattern of the  abdominal aorta with solitary bilateral renal arteries. Moderate mixed calcific atherosclerosis. Review of the MIP images confirms the above findings. NON-VASCULAR Lower Chest: No acute findings. Moderate hiatal hernia with intrathoracic position of the gastric fundus. Cardiomegaly. Hepatobiliary: No solid liver abnormality is seen. No gallstones, gallbladder wall thickening, or biliary dilatation. Pancreas: Unremarkable. No pancreatic ductal dilatation or surrounding inflammatory changes. Spleen: Normal in size without significant abnormality. Adrenals/Urinary Tract: Benign, macroscopic fat containing left adrenal adenoma, for which no further follow-up or characterization is required. Multiple simple, benign bilateral renal cortical cysts, including a large cyst arising from the inferior pole of the left kidney, requiring no specific further follow-up or characterization. Kidneys are otherwise normal, without renal calculi, solid lesion, or hydronephrosis. Bladder is unremarkable.  Stomach/Bowel: Stomach is within normal limits. Appendix appears normal. No evidence of bowel wall thickening, distention, or inflammatory changes. Descending and sigmoid diverticulosis without evidence of acute diverticulitis. Arterial contrast extravasation arising from a prominent diverticulum in the mid sigmoid (series 8, image 136). Lymphatic: No enlarged abdominal or pelvic lymph nodes. Reproductive: No mass or other significant abnormality. Other: Right inguinal hernia containing nonobstructed distal small bowel (series 8, image 172). No ascites. Musculoskeletal: No acute osseous findings. Status post left hip total arthroplasty. IMPRESSION: 1. Arterial contrast extravasation arising from a prominent diverticulum in the mid sigmoid, consistent with active diverticular bleeding. 2. Descending and sigmoid diverticulosis without evidence of acute diverticulitis. 3. Normal contour and caliber of the abdominal aorta. No evidence of  aneurysm, dissection, or other acute aortic pathology. Moderate mixed calcific atherosclerosis. 4. Right inguinal hernia containing nonobstructed distal small bowel. Electronically Signed   By: Marolyn JONETTA Jaksch M.D.   On: 11/20/2023 12:49     Discharge Exam: Vitals:   11/22/23 0956 11/22/23 1207  BP: 102/65 119/66  Pulse:  61  Resp:  17  Temp:  97.8 F (36.6 C)  SpO2:     Vitals:   11/21/23 2111 11/22/23 0302 11/22/23 0956 11/22/23 1207  BP: 114/74 128/68 102/65 119/66  Pulse: 61 66  61  Resp: 16 16  17   Temp: 97.8 F (36.6 C) (!) 97.5 F (36.4 C)  97.8 F (36.6 C)  TempSrc: Oral Oral  Oral  SpO2: 97% 94%    Weight:  58.3 kg    Height:        General: Pt is alert, awake, not in acute distress Cardiovascular: RRR, S1/S2 +, no rubs, no gallops Respiratory: CTA bilaterally, no wheezing, no rhonchi Abdominal: Soft, NT, ND, bowel sounds + Extremities: no edema, no cyanosis    The results of significant diagnostics from this hospitalization (including imaging, microbiology, ancillary and laboratory) are listed below for reference.     Microbiology: No results found for this or any previous visit (from the past 240 hours).   Labs: BNP (last 3 results) No results for input(s): BNP in the last 8760 hours. Basic Metabolic Panel: Recent Labs  Lab 11/20/23 0810 11/21/23 0852 11/22/23 0311  NA 139 139 135  K 4.1 4.1 4.2  CL 105 107 105  CO2 23 24 23   GLUCOSE 204* 159* 130*  BUN 25* 21 25*  CREATININE 1.33* 1.27* 1.58*  CALCIUM 9.7 9.3 9.2   Liver Function Tests: Recent Labs  Lab 11/20/23 0810 11/21/23 0852  AST 23 19  ALT 19 18  ALKPHOS 97 74  BILITOT 0.9 0.8  PROT 6.2* 5.1*  ALBUMIN  3.9 3.1*   Recent Labs  Lab 11/20/23 0810  LIPASE 25   No results for input(s): AMMONIA in the last 168 hours. CBC: Recent Labs  Lab 11/20/23 0810 11/20/23 1435 11/20/23 2119 11/21/23 0852 11/21/23 1257 11/21/23 2135 11/22/23 0311  WBC 7.4   < > 6.9 5.9 6.6 6.8  6.6  NEUTROABS 5.4  --   --   --   --   --  4.2  HGB 12.5   < > 9.4* 9.2* 9.3* 8.6* 8.6*  HCT 39.9   < > 29.0* 28.9* 28.7* 27.3* 26.7*  MCV 95.5   < > 94.2 95.4 94.4 95.8 94.3  PLT 207   < > 179 178 195 178 179   < > = values in this interval not displayed.   Cardiac Enzymes: No results for input(s): CKTOTAL, CKMB, CKMBINDEX,  TROPONINI in the last 168 hours. BNP: Invalid input(s): POCBNP CBG: Recent Labs  Lab 11/20/23 1752  GLUCAP 150*   D-Dimer No results for input(s): DDIMER in the last 72 hours. Hgb A1c No results for input(s): HGBA1C in the last 72 hours. Lipid Profile No results for input(s): CHOL, HDL, LDLCALC, TRIG, CHOLHDL, LDLDIRECT in the last 72 hours. Thyroid  function studies No results for input(s): TSH, T4TOTAL, T3FREE, THYROIDAB in the last 72 hours.  Invalid input(s): FREET3 Anemia work up No results for input(s): VITAMINB12, FOLATE, FERRITIN, TIBC, IRON, RETICCTPCT in the last 72 hours. Urinalysis    Component Value Date/Time   COLORURINE YELLOW 11/20/2023 1133   APPEARANCEUR CLOUDY (A) 11/20/2023 1133   LABSPEC 1.016 11/20/2023 1133   PHURINE 5.0 11/20/2023 1133   GLUCOSEU NEGATIVE 11/20/2023 1133   HGBUR MODERATE (A) 11/20/2023 1133   BILIRUBINUR NEGATIVE 11/20/2023 1133   KETONESUR NEGATIVE 11/20/2023 1133   PROTEINUR 30 (A) 11/20/2023 1133   UROBILINOGEN 0.2 12/12/2012 1240   NITRITE NEGATIVE 11/20/2023 1133   LEUKOCYTESUR LARGE (A) 11/20/2023 1133   Sepsis Labs Recent Labs  Lab 11/21/23 0852 11/21/23 1257 11/21/23 2135 11/22/23 0311  WBC 5.9 6.6 6.8 6.6   Microbiology No results found for this or any previous visit (from the past 240 hours).  FURTHER DISCHARGE INSTRUCTIONS:   Get Medicines reviewed and adjusted: Please take all your medications with you for your next visit with your Primary MD   Laboratory/radiological data: Please request your Primary MD to go over all hospital tests  and procedure/radiological results at the follow up, please ask your Primary MD to get all Hospital records sent to his/her office.   In some cases, they will be blood work, cultures and biopsy results pending at the time of your discharge. Please request that your primary care M.D. goes through all the records of your hospital data and follows up on these results.   Also Note the following: If you experience worsening of your admission symptoms, develop shortness of breath, life threatening emergency, suicidal or homicidal thoughts you must seek medical attention immediately by calling 911 or calling your MD immediately  if symptoms less severe.   You must read complete instructions/literature along with all the possible adverse reactions/side effects for all the Medicines you take and that have been prescribed to you. Take any new Medicines after you have completely understood and accpet all the possible adverse reactions/side effects.    Do not drive when taking Pain medications or sleeping medications (Benzodaizepines)   Do not take more than prescribed Pain, Sleep and Anxiety Medications. It is not advisable to combine anxiety,sleep and pain medications without talking with your primary care practitioner   Special Instructions: If you have smoked or chewed Tobacco  in the last 2 yrs please stop smoking, stop any regular Alcohol   and or any Recreational drug use.   Wear Seat belts while driving.   Please note: You were cared for by a hospitalist during your hospital stay. Once you are discharged, your primary care physician will handle any further medical issues. Please note that NO REFILLS for any discharge medications will be authorized once you are discharged, as it is imperative that you return to your primary care physician (or establish a relationship with a primary care physician if you do not have one) for your post hospital discharge needs so that they can reassess your need for  medications and monitor your lab values  Time coordinating discharge: Over 30 minutes  SIGNED:   Fredia Skeeter, MD  [facility] 11/22/2023, 12:36 PM *Please note that this is a verbal dictation therefore any spelling or grammatical errors are due to the Dragon [employer] One system interpretation. If 7PM-7AM, please contact night-coverage [email or web address]

## 2023-11-22 NOTE — Progress Notes (Signed)
 Discharge instructions (including medications) discussed with and copy provided to patient/caregiver

## 2023-11-23 ENCOUNTER — Encounter (HOSPITAL_COMMUNITY): Payer: Self-pay | Admitting: Gastroenterology

## 2023-11-23 LAB — TYPE AND SCREEN
ABO/RH(D): O POS
Antibody Screen: POSITIVE
Donor AG Type: NEGATIVE
Unit division: 0
Unit division: 0
Unit division: 0

## 2023-11-23 LAB — BPAM RBC
Blood Product Expiration Date: 202502012359
Blood Product Expiration Date: 202502012359
Unit Type and Rh: 5100
Unit Type and Rh: 5100

## 2023-11-24 NOTE — Progress Notes (Signed)
 Hospital follow up  Assessment and Plan: Hospital visit follow up for:   1. Hospital discharge follow-up (Primary) Reviewed discharge instructions in full including medication changes, diagnostics, labs, and future follow ups appointment. All questions and concerns addressed.   - CBC with Differential/Platelet  2. History of GI diverticular bleed Resolved Continue to monitor  3. History of diverticulitis  - CBC with Differential/Platelet - COMPLETE METABOLIC PANEL WITH GFR - Iron, TIBC and Ferritin Panel  4. Iron deficiency anemia due to chronic blood loss Continue daily iron supplement  - Iron, TIBC and Ferritin Panel  5.  Long term use of anticoagulant Resume Xarelto   All medications were reviewed with patient and family and fully reconciled. All questions answered fully, and patient and family members were encouraged to call the office with any further questions or concerns. Discussed goal to avoid readmission related to this diagnosis.    Over 40 minutes of exam, counseling, chart review, and complex, high/moderate level critical decision making was performed this visit.   Future Appointments  Date Time Provider Department Center  11/25/2023 10:45 AM Laurice President, NP [facility] None  12/15/2023 11:00 AM Wilkinson, Dana E, NP [facility] None  03/14/2024  3:00 PM Tonita Fallow, MD [facility] None  06/13/2024 11:30 AM Wilkinson, Dana E, NP [facility] None     HPI 89 y.o.female presents alongside daughter for follow up for transition from recent hospitalization or [facility]IF stay. Admit date to the hospital was 11/20/23, patient was discharged from the hospital on 11/22/23 and our clinical staff contacted the office the day after discharge to set up a follow up appointment. The discharge summary, medications, and diagnostic test results were reviewed before meeting with the patient. The patient was admitted for rectal bleeding:   Awoke morning of 11/20/23 with urge to go to the  bathroom she had significant red blood in the bowl with large clots. She presented to the ED for further evaluation. While there she has had 2 further large bloody bowel movements. Also reporting some weakness and mild abdominal pain. No prior history of GI bleeding does have history of diverticulitis. On Xarelto  for atrial fibrillation. Denied fevers, chills, chest pain, shortness breath, constipation, nausea, vomiting.   CT angio GI bleed study was positive for arterial contrast extravasation from diverticula at the sigmoid colon consistent with active diverticular bleeding. Also noted was further diverticulosis without diverticulitis.   CT angio GI bleed study was positive for arterial contrast extravasation from diverticula at the sigmoid colon consistent with active diverticular bleeding.  Patient admitted under hospital service for acute blood loss anemia secondary to lower GI bleed/diverticular bleed, seen by GI, underwent emergent flex sigmoidoscopy, MRI compatible clip was placed at bleeding diverticuli.  Patient's hemoglobin dropped from 12.5 upon arrival to only 8.6, she did not require any blood transfusion during this hospitalization.  She has not had any further rectal bleeding, hemodynamically stable.  Discharged to home. She was asked to resume Xarelto  on 11/25/23 to which she has.  Reports feeling well today, fatigue improving.  Denies any recent melena or black tarry stools.    Home health is not involved.   Images while in the hospital: CT ANGIO GI BLEED Result Date: 11/20/2023 CLINICAL DATA:  Abdominal pain, rectal bleeding EXAM: CTA ABDOMEN AND PELVIS WITHOUT AND WITH CONTRAST TECHNIQUE: Multidetector CT imaging of the abdomen and pelvis was performed using the standard protocol during bolus administration of intravenous contrast. Multiplanar reconstructed images and MIPs were obtained and reviewed to evaluate the vascular anatomy.  RADIATION DOSE REDUCTION: This exam was performed  according to the departmental dose-optimization program which includes automated exposure control, adjustment of the mA and/or kV according to patient size and/or use of iterative reconstruction technique. CONTRAST:  75mL OMNIPAQUE  IOHEXOL  350 MG/ML SOLN COMPARISON:  09/15/2022 FINDINGS: VASCULAR Normal contour and caliber of the abdominal aorta. No evidence of aneurysm, dissection, or other acute aortic pathology. Standard branching pattern of the abdominal aorta with solitary bilateral renal arteries. Moderate mixed calcific atherosclerosis. Review of the MIP images confirms the above findings. NON-VASCULAR Lower Chest: No acute findings. Moderate hiatal hernia with intrathoracic position of the gastric fundus. Cardiomegaly. Hepatobiliary: No solid liver abnormality is seen. No gallstones, gallbladder wall thickening, or biliary dilatation. Pancreas: Unremarkable. No pancreatic ductal dilatation or surrounding inflammatory changes. Spleen: Normal in size without significant abnormality. Adrenals/Urinary Tract: Benign, macroscopic fat containing left adrenal adenoma, for which no further follow-up or characterization is required. Multiple simple, benign bilateral renal cortical cysts, including a large cyst arising from the inferior pole of the left kidney, requiring no specific further follow-up or characterization. Kidneys are otherwise normal, without renal calculi, solid lesion, or hydronephrosis. Bladder is unremarkable. Stomach/Bowel: Stomach is within normal limits. Appendix appears normal. No evidence of bowel wall thickening, distention, or inflammatory changes. Descending and sigmoid diverticulosis without evidence of acute diverticulitis. Arterial contrast extravasation arising from a prominent diverticulum in the mid sigmoid (series 8, image 136). Lymphatic: No enlarged abdominal or pelvic lymph nodes. Reproductive: No mass or other significant abnormality. Other: Right inguinal hernia containing  nonobstructed distal small bowel (series 8, image 172). No ascites. Musculoskeletal: No acute osseous findings. Status post left hip total arthroplasty. IMPRESSION: 1. Arterial contrast extravasation arising from a prominent diverticulum in the mid sigmoid, consistent with active diverticular bleeding. 2. Descending and sigmoid diverticulosis without evidence of acute diverticulitis. 3. Normal contour and caliber of the abdominal aorta. No evidence of aneurysm, dissection, or other acute aortic pathology. Moderate mixed calcific atherosclerosis. 4. Right inguinal hernia containing nonobstructed distal small bowel. Electronically Signed   By: Marolyn JONETTA Jaksch M.D.   On: 11/20/2023 12:49      Current Outpatient Medications (Cardiovascular):    amiodarone  (PACERONE ) 200 MG tablet, Take 1 tablet by mouth once daily   diltiazem  (CARDIZEM  CD) 180 MG 24 hr capsule, Take 1 capsule  Daily for Heart & BP                                                                                                   /                          TAKE                                      BY  MOUTH                                                   ONCE ? DAILY (Patient taking differently: Take 180 mg by mouth in the morning.)   furosemide  (LASIX ) 40 MG tablet, Take 1 tablet (40 mg total) by mouth daily.  Current Outpatient Medications (Respiratory):    albuterol  (PROAIR  HFA) 108 (90 Base) MCG/ACT inhaler, Take  2 inhalations  15 minutes apart  every 4 hours  as needed for Wheezing   loratadine (CLARITIN) 10 MG tablet, Take 10 mg by mouth daily as needed for allergies.  Current Outpatient Medications (Analgesics):    acetaminophen  (TYLENOL ) 500 MG tablet, Take 500 mg by mouth every 6 (six) hours as needed for moderate pain.  Current Outpatient Medications (Hematological):    Cyanocobalamin  (B-12 PO), Take 1 capsule by mouth daily.   Ferrous Sulfate (SLOW RELEASE IRON PO), Take 1 tablet by  mouth at bedtime.   [START ON 11/25/2023] Rivaroxaban  (XARELTO ) 15 MG TABS tablet, Take 1 tablet (15 mg total) by mouth daily with supper.  Current Outpatient Medications (Other):    Ascorbic Acid  (VITAMIN C  PO), Take 1 tablet by mouth at bedtime.   Boswellia-Glucosamine-Vit D (OSTEO BI-FLEX ONE PER DAY PO), Take 1 tablet by mouth in the morning.   Carboxymeth-Glycerin -Polysorb (REFRESH DIGITAL OP), Place 1 drop into the right eye daily as needed (dry eyes).   cholecalciferol  (VITAMIN D3) 25 MCG (1000 UNIT) tablet, Take 3,000 Units by mouth daily.   diazepam  (VALIUM ) 2 MG tablet, Take 1 tablet 3 - 4 x /day for Tremor                                     /                                 TAKE                    BY                     MOUTH (Patient taking differently: Take 2 mg by mouth as needed (Tremors).)   Multiple Vitamins-Minerals (ZINC PO), Take 1 capsule by mouth daily.   polyethylene glycol (MIRALAX  / GLYCOLAX ) 17 g packet, Take 17 g by mouth daily. (Patient taking differently: Take 17 g by mouth daily as needed for mild constipation.)   Pyridoxine HCl (B-6 PO), Take 1 tablet by mouth daily.   Simethicone (GAS-X PO), Take 1 tablet by mouth daily as needed (gas).  Past Medical History:  Diagnosis Date   Acute on chronic diastolic (congestive) heart failure (HCC) 11/21/2018   Cancer (HCC)    skin cancer   DDD (degenerative disc disease)    Diverticula of colon    Diverticulitis    DJD (degenerative joint disease)    GERD (gastroesophageal reflux disease)    takes ranitidine   Heart murmur    History of hiatal hernia    History of pneumonia    Hx of irritable bowel syndrome    Hyperlipidemia    Hypertension    Occasional tremors    Osteoporosis    Pre-diabetes  Varicose veins    Vitamin D  deficiency      Allergies  Allergen Reactions   Codeine Rash   Accupril [Quinapril Hcl] Cough   Prednisone      Agitated and shaky   Reglan [Metoclopramide] Other (See Comments)     tremors   Tramadol  Nausea And Vomiting   Adhesive [Tape] Rash   Neosporin [Neomycin-Bacitracin Zn-Polymyx] Itching and Rash    ROS: all negative except above.   Physical Exam: There were no vitals filed for this visit. There were no vitals taken for this visit. General Appearance: Well nourished, in no apparent distress. Eyes: PERRLA, EOMs, conjunctiva no swelling or erythema Sinuses: No Frontal/maxillary tenderness ENT/Mouth: Ext aud canals clear, TMs without erythema, bulging. No erythema, swelling, or exudate on post pharynx.  Tonsils not swollen or erythematous. Hearing normal.  Neck: Supple, thyroid  normal.  Respiratory: Respiratory effort normal, BS equal bilaterally without rales, rhonchi, wheezing or stridor.  Cardio: RRR with no MRGs. Brisk peripheral pulses without edema.  Abdomen: Soft, + BS.  Non tender, no guarding, rebound, hernias, masses. Lymphatics: Non tender without lymphadenopathy.  Musculoskeletal: Full ROM, 5/5 strength, normal gait.  Skin: Warm, dry without rashes, lesions, ecchymosis.  Neuro: Cranial nerves intact. Normal muscle tone, no cerebellar symptoms. Sensation intact.  Psych: Awake and oriented X 3, normal affect, Insight and Judgment appropriate.     BASCOM NECESSARY, NP 10:54 PM [facility]

## 2023-11-25 ENCOUNTER — Ambulatory Visit (INDEPENDENT_AMBULATORY_CARE_PROVIDER_SITE_OTHER): Payer: Medicare Other | Admitting: Nurse Practitioner

## 2023-11-25 VITALS — BP 118/68 | HR 60 | Temp 97.6°F | Ht 60.0 in | Wt 129.0 lb

## 2023-11-25 DIAGNOSIS — Z09 Encounter for follow-up examination after completed treatment for conditions other than malignant neoplasm: Secondary | ICD-10-CM

## 2023-11-25 DIAGNOSIS — Z7901 Long term (current) use of anticoagulants: Secondary | ICD-10-CM | POA: Diagnosis not present

## 2023-11-25 DIAGNOSIS — D500 Iron deficiency anemia secondary to blood loss (chronic): Secondary | ICD-10-CM

## 2023-11-25 DIAGNOSIS — Z8719 Personal history of other diseases of the digestive system: Secondary | ICD-10-CM | POA: Diagnosis not present

## 2023-11-26 LAB — COMPLETE METABOLIC PANEL WITH GFR
AG Ratio: 2.1 (calc) (ref 1.0–2.5)
ALT: 17 U/L (ref 6–29)
AST: 19 U/L (ref 10–35)
Albumin: 4.2 g/dL (ref 3.6–5.1)
Alkaline phosphatase (APISO): 97 U/L (ref 37–153)
BUN/Creatinine Ratio: 15 (calc) (ref 6–22)
BUN: 22 mg/dL (ref 7–25)
CO2: 27 mmol/L (ref 20–32)
Calcium: 9.9 mg/dL (ref 8.6–10.4)
Chloride: 104 mmol/L (ref 98–110)
Creat: 1.42 mg/dL — ABNORMAL HIGH (ref 0.60–0.95)
Globulin: 2 g/dL (ref 1.9–3.7)
Glucose, Bld: 119 mg/dL — ABNORMAL HIGH (ref 65–99)
Potassium: 4.8 mmol/L (ref 3.5–5.3)
Sodium: 140 mmol/L (ref 135–146)
Total Bilirubin: 0.4 mg/dL (ref 0.2–1.2)
Total Protein: 6.2 g/dL (ref 6.1–8.1)
eGFR: 36 mL/min/{1.73_m2} — ABNORMAL LOW (ref >=60)

## 2023-11-26 LAB — CBC WITH DIFFERENTIAL/PLATELET
Absolute Lymphocytes: 754 {cells}/uL — ABNORMAL LOW (ref 850–3900)
Absolute Monocytes: 638 {cells}/uL (ref 200–950)
Basophils Absolute: 52 {cells}/uL (ref 0–200)
Basophils Relative: 0.9 %
Eosinophils Absolute: 168 {cells}/uL (ref 15–500)
Eosinophils Relative: 2.9 %
HCT: 30.1 % — ABNORMAL LOW (ref 35.0–45.0)
Hemoglobin: 9.6 g/dL — ABNORMAL LOW (ref 11.7–15.5)
MCH: 30 pg (ref 27.0–33.0)
MCHC: 31.9 g/dL — ABNORMAL LOW (ref 32.0–36.0)
MCV: 94.1 fL (ref 80.0–100.0)
MPV: 11.7 fL (ref 7.5–12.5)
Monocytes Relative: 11 %
Neutro Abs: 4188 {cells}/uL (ref 1500–7800)
Neutrophils Relative %: 72.2 %
Platelets: 233 10*3/uL (ref 140–400)
RBC: 3.2 10*6/uL — ABNORMAL LOW (ref 3.80–5.10)
RDW: 13.8 % (ref 11.0–15.0)
Total Lymphocyte: 13 %
WBC: 5.8 10*3/uL (ref 3.8–10.8)

## 2023-11-26 LAB — IRON,TIBC AND FERRITIN PANEL
%SAT: 6 % — ABNORMAL LOW (ref 16–45)
Ferritin: 35 ng/mL (ref 16–288)
Iron: 21 ug/dL — ABNORMAL LOW (ref 45–160)
TIBC: 324 ug/dL (ref 250–450)

## 2023-11-28 NOTE — Patient Instructions (Signed)
 Gastrointestinal Bleeding Gastrointestinal (GI) bleeding is bleeding anywhere along the digestive tract. The digestive tract carries food from your mouth until it leaves your body as waste, or poop. You may have blood in your poop or have black poop. If you vomit, there may be blood in it too. This condition can be mild, serious, or even life-threatening. If you have a lot of bleeding, you may need to stay in the hospital. What are the causes? This condition may be caused by: Irritation and swelling of the esophagus (esophagitis). The esophagus is part of the body that moves food from your mouth to your stomach. Swollen veins in the butt (hemorrhoids). Tears in the opening of the butt. These are often caused by passing hard poop. Pouches that form on the colon (diverticulosis). Irritation and swelling of pouches that have formed in your colon (diverticulitis). Growths (polyps) or cancer. Irritation of the stomach lining (gastritis). Sores (ulcers) in the stomach. What increases the risk? You are more likely to develop this condition if: You have a certain type of infection in your stomach called Helicobacter pylori infection. You take certain medicines. You smoke. You drink alcohol. What are the signs or symptoms? Common symptoms of this condition include: Vomiting material that has bright red blood in it. It may look like coffee grounds. Changes in your poop. The poop: May have red blood in it. May be black, look like tar, and smell stronger than normal. May be red. Pain or cramping in the belly (abdomen). How is this treated? Treatment for this condition depends on the cause of the bleeding. For example: Your doctor may treat the bleeding during diagnosis. Common ways of diagnosing this condition is endoscopy or colonoscopy. Medicines can be used to: Help control irritation, swelling, or infection. Reduce acid in your stomach. Certain problems can be treated  with: Creams. Medicines that are put in the butt (suppositories). Warm baths. Surgery is sometimes needed. If you lose a lot of blood, you may need a blood transfusion. If there is a lot of bleeding, you will need to stay in the hospital. If bleeding is mild, you may be allowed to go home. Follow these instructions at home:  Take over-the-counter and prescription medicines only as told by your doctor. Eat foods that have a lot of fiber in them. These foods include beans, whole grains, and fresh fruits and vegetables. You can also try eating 1-3 prunes each day. Drink enough fluid to keep your pee (urine) pale yellow. Keep all follow-up visits. Contact a doctor if: Your symptoms do not get better with treatment. Get help right away if: Your bleeding does not stop. You feel dizzy or you pass out (faint). You feel weak. You have very bad cramps in your back or belly. You pass large clumps of blood (clots) in your poop. Your symptoms are getting worse. You have chest pain or fast heartbeats. These symptoms may be an emergency. Get help right away. Call 911. Do not wait to see if the symptoms will go away. Do not drive yourself to the hospital. Summary Gastrointestinal (GI) bleeding is bleeding anywhere along the digestive tract. The digestive tract carries food from your mouth until it leaves your body as waste, or poop. This bleeding can be caused by many things. Treatment depends on the cause of the bleeding. Take medicines only as told by your doctor. Keep all follow-up visits. This information is not intended to replace advice given to you by your health care provider. Make sure  you discuss any questions you have with your health care provider. Document Revised: 06/06/2021 Document Reviewed: 06/06/2021 Elsevier Patient Education  2024 [employer].

## 2023-12-11 ENCOUNTER — Other Ambulatory Visit (HOSPITAL_COMMUNITY): Payer: Self-pay | Admitting: Physician Assistant

## 2023-12-11 DIAGNOSIS — I482 Chronic atrial fibrillation, unspecified: Secondary | ICD-10-CM

## 2023-12-15 ENCOUNTER — Ambulatory Visit: Payer: Medicare Other | Admitting: Nurse Practitioner

## 2024-02-22 NOTE — Patient Instructions (Incomplete)

## 2024-02-22 NOTE — Progress Notes (Unsigned)
 New Patient Office Visit  Subjective    Patient ID: Briana [name], female    DOB: 09-Feb-1934  Age: 89 y.o. MRN: [id number]  CC: No chief complaint on file.   HPI Briana [name] presents to establish care today. Up to date on routine vaccines. Up to date on routine screenings.  Receives regular dental and eye care.  Reports eating well, sleeping well, feeling well overall.  Reports compliance with medication regimen.  Denies other concerns today.  Outpatient Encounter Medications as of 02/23/2024  Medication Sig   acetaminophen (TYLENOL) 500 MG tablet Take 500 mg by mouth every 6 (six) hours as needed for moderate pain.   albuterol (PROAIR HFA) 108 (90 Base) MCG/ACT inhaler Take  2 inhalations  15 minutes apart  every 4 hours  as needed for Wheezing   amiodarone (PACERONE) 200 MG tablet Take 1 tablet by mouth once daily   Ascorbic Acid (VITAMIN C PO) Take 1 tablet by mouth at bedtime.   Boswellia-Glucosamine-Vit D (OSTEO BI-FLEX ONE PER DAY PO) Take 1 tablet by mouth in the morning.   Carboxymeth-Glycerin-Polysorb (REFRESH DIGITAL OP) Place 1 drop into the right eye daily as needed (dry eyes).   cholecalciferol (VITAMIN D3) 25 MCG (1000 UNIT) tablet Take 3,000 Units by mouth daily.   Cyanocobalamin (B-12 PO) Take 1 capsule by mouth daily.   diazepam (VALIUM) 2 MG tablet Take 1 tablet 3 - 4 x /day for Tremor                                     /                                 TAKE                    BY                     MOUTH (Patient taking differently: Take 2 mg by mouth as needed (Tremors).)   diltiazem (CARDIZEM CD) 180 MG 24 hr capsule Take 1 capsule  Daily for Heart & BP                                                                                                   /                          TAKE                                      BY                                            MOUTH  ONCE ? DAILY (Patient taking differently: Take  180 mg by mouth in the morning.)   Ferrous Sulfate (SLOW RELEASE IRON PO) Take 1 tablet by mouth at bedtime.   furosemide (LASIX) 40 MG tablet Take 1 tablet (40 mg total) by mouth daily.   loratadine (CLARITIN) 10 MG tablet Take 10 mg by mouth daily as needed for allergies.   Multiple Vitamins-Minerals (ZINC PO) Take 1 capsule by mouth daily.   polyethylene glycol (MIRALAX / GLYCOLAX) 17 g packet Take 17 g by mouth daily. (Patient taking differently: Take 17 g by mouth daily as needed for mild constipation.)   Pyridoxine HCl (B-6 PO) Take 1 tablet by mouth daily.   Rivaroxaban (XARELTO) 15 MG TABS tablet TAKE 1 TABLET BY MOUTH ONCE DAILY WITH  SUPPER  STOP  ELIQUIS   Simethicone (GAS-X PO) Take 1 tablet by mouth daily as needed (gas).   No facility-administered encounter medications on file as of 02/23/2024.    Past Medical History:  Diagnosis Date   Acute on chronic diastolic (congestive) heart failure (HCC) 11/21/2018   Cancer (HCC)    skin cancer   DDD (degenerative disc disease)    Diverticula of colon    Diverticulitis    DJD (degenerative joint disease)    GERD (gastroesophageal reflux disease)    takes ranitidine   Heart murmur    History of hiatal hernia    History of pneumonia    Hx of irritable bowel syndrome    Hyperlipidemia    Hypertension    Occasional tremors    Osteoporosis    Pre-diabetes    Varicose veins    Vitamin D deficiency     Past Surgical History:  Procedure Laterality Date   APPENDECTOMY     BREAST EXCISIONAL BIOPSY Right    BREAST SURGERY Right    fluid drained from breast   CARDIOVERSION N/A 12/20/2018   Procedure: CARDIOVERSION;  Surgeon: [name], MD;  Location: [facility] ENDOSCOPY;  Service: Cardiovascular;  Laterality: N/A;   CARDIOVERSION N/A 06/16/2019   Procedure: CARDIOVERSION;  Surgeon: [name], MD;  Location: [facility] ENDOSCOPY;  Service: Cardiovascular;  Laterality: N/A;   CARDIOVERSION N/A 05/17/2023   Procedure: CARDIOVERSION;   Surgeon: [name], MD;  Location: [facility] INVASIVE CV LAB;  Service: Cardiovascular;  Laterality: N/A;   COLONOSCOPY     ESOPHAGOGASTRODUODENOSCOPY     FIDUCIAL MARKER PLACEMENT  11/20/2023   Procedure: CLIP MARKER PLACEMENT;  Surgeon: Jeani Hawking, MD;  Location: [facility] ENDOSCOPY;  Service: Gastroenterology;;   Wenda Low SIGMOIDOSCOPY N/A 03/09/2021   Procedure: FLEXIBLE SIGMOIDOSCOPY;  Surgeon: [name], MD;  Location: [facility] ENDOSCOPY;  Service: Endoscopy;  Laterality: N/A;   FLEXIBLE SIGMOIDOSCOPY N/A 11/20/2023   Procedure: FLEXIBLE SIGMOIDOSCOPY;  Surgeon: Jeani Hawking, MD;  Location: [facility] ENDOSCOPY;  Service: Gastroenterology;  Laterality: N/A;   HEMOSTASIS CLIP PLACEMENT  03/09/2021   Procedure: HEMOSTASIS CLIP PLACEMENT;  Surgeon: [name], MD;  Location: [facility] ENDOSCOPY;  Service: Endoscopy;;   JOINT REPLACEMENT Left    left hip   LUMBAR LAMINECTOMY/DECOMPRESSION MICRODISCECTOMY N/A 06/05/2015   Procedure: L3-L5 DECOMPRESSION ;  Surgeon: [name], MD;  Location: [facility] OR;  Service: Orthopedics;  Laterality: N/A;   OPEN REDUCTION INTERNAL FIXATION (ORIF) DISTAL RADIAL FRACTURE Right 01/10/2014   Procedure: OPEN REDUCTION INTERNAL FIXATION (ORIF) DISTAL RADIAL FRACTURE;  Surgeon: [name], MD;  Location: [facility] OR;  Service: Orthopedics;  Laterality: Right;   SPINAL CORD DECOMPRESSION  06/05/2015   L3 L 5  THYROID SURGERY     TONSILLECTOMY     TUBAL LIGATION     varicose veins stripped     WRIST FRACTURE SURGERY Bilateral     Family History  Problem Relation Age of Onset   Hypertension Mother    Hyperlipidemia Mother    Heart disease Sister    Cancer Brother        prostate   Colon cancer Neg Hx    Esophageal cancer Neg Hx    Pancreatic cancer Neg Hx    Stomach cancer Neg Hx    Liver disease Neg Hx    Breast cancer Neg Hx     Social History   Socioeconomic History   Marital status: Widowed    Spouse name: Not on file   Number of children: Not on file    Years of education: Not on file   Highest education level: Not on file  Occupational History   Not on file  Tobacco Use   Smoking status: Never   Smokeless tobacco: Never   Tobacco comments:    Never smoked 09/13/23  Vaping Use   Vaping status: Never Used  Substance and Sexual Activity   Alcohol use: No   Drug use: No   Sexual activity: Not on file  Other Topics Concern   Not on file  Social History Narrative   Not on file   Social Drivers of Health   Financial Resource Strain: Not on file  Food Insecurity: No Food Insecurity (11/21/2023)   Hunger Vital Sign    Worried About Running Out of Food in the Last Year: Never true    Ran Out of Food in the Last Year: Never true  Transportation Needs: No Transportation Needs (11/21/2023)   PRAPARE - Administrator, Civil Service (Medical): No    Lack of Transportation (Non-Medical): No  Physical Activity: Not on file  Stress: Not on file  Social Connections: Moderately Integrated (11/21/2023)   Social Connection and Isolation Panel [NHANES]    Frequency of Communication with Friends and Family: More than three times a week    Frequency of Social Gatherings with Friends and Family: Twice a week    Attends Religious Services: More than 4 times per year    Active Member of [employer] or Organizations: Yes    Attends Banker Meetings: More than 4 times per year    Marital Status: Widowed  Intimate Partner Violence: Not At Risk (11/21/2023)   Humiliation, Afraid, Rape, and Kick questionnaire    Fear of Current or Ex-Partner: No    Emotionally Abused: No    Physically Abused: No    Sexually Abused: No    ROS Per HPI      Objective    There were no vitals taken for this visit.  Physical Exam Vitals and nursing note reviewed.  Constitutional:      General: She is not in acute distress.    Appearance: Normal appearance. She is normal weight.  HENT:     Head: Normocephalic and atraumatic.     Right Ear:  External ear normal.     Left Ear: External ear normal.     Nose: Nose normal.     Mouth/Throat:     Mouth: Mucous membranes are moist.     Pharynx: Oropharynx is clear.  Eyes:     Extraocular Movements: Extraocular movements intact.     Pupils: Pupils are equal, round, and reactive to light.  Cardiovascular:  Rate and Rhythm: Normal rate and regular rhythm.     Pulses: Normal pulses.     Heart sounds: Normal heart sounds.  Pulmonary:     Effort: Pulmonary effort is normal. No respiratory distress.     Breath sounds: Normal breath sounds. No wheezing, rhonchi or rales.  Musculoskeletal:        General: Normal range of motion.     Cervical back: Normal range of motion.     Right lower leg: No edema.     Left lower leg: No edema.  Lymphadenopathy:     Cervical: No cervical adenopathy.  Neurological:     General: No focal deficit present.     Mental Status: She is alert and oriented to person, place, and time.  Psychiatric:        Mood and Affect: Mood normal.        Thought Content: Thought content normal.         Assessment & Plan:   Persistent atrial fibrillation (HCC)  Hypercoagulable state due to persistent atrial fibrillation (HCC)  Chronic diastolic heart failure (HCC)  Type 2 diabetes mellitus with hyperlipidemia (HCC)  Hyperparathyroidism due to renal insufficiency (HCC) (Elev PTH 140 on 03/05/2021   CKD stage 3 due to type 2 diabetes mellitus (HCC)     No follow-ups on file.   Sherald Barge, FNP

## 2024-02-23 ENCOUNTER — Encounter: Payer: Self-pay | Admitting: Family Medicine

## 2024-02-23 ENCOUNTER — Telehealth: Payer: Self-pay | Admitting: Family Medicine

## 2024-02-23 ENCOUNTER — Other Ambulatory Visit: Payer: Self-pay | Admitting: Family Medicine

## 2024-02-23 ENCOUNTER — Other Ambulatory Visit: Payer: Self-pay | Admitting: Internal Medicine

## 2024-02-23 ENCOUNTER — Ambulatory Visit: Payer: Medicare Other | Admitting: Family Medicine

## 2024-02-23 VITALS — BP 128/72 | HR 97 | Temp 97.8°F | Wt 128.4 lb

## 2024-02-23 DIAGNOSIS — I4819 Other persistent atrial fibrillation: Secondary | ICD-10-CM

## 2024-02-23 DIAGNOSIS — N2581 Secondary hyperparathyroidism of renal origin: Secondary | ICD-10-CM | POA: Diagnosis not present

## 2024-02-23 DIAGNOSIS — N183 Chronic kidney disease, stage 3 unspecified: Secondary | ICD-10-CM

## 2024-02-23 DIAGNOSIS — E559 Vitamin D deficiency, unspecified: Secondary | ICD-10-CM

## 2024-02-23 DIAGNOSIS — D6869 Other thrombophilia: Secondary | ICD-10-CM | POA: Diagnosis not present

## 2024-02-23 DIAGNOSIS — E785 Hyperlipidemia, unspecified: Secondary | ICD-10-CM

## 2024-02-23 DIAGNOSIS — E1122 Type 2 diabetes mellitus with diabetic chronic kidney disease: Secondary | ICD-10-CM

## 2024-02-23 DIAGNOSIS — E1169 Type 2 diabetes mellitus with other specified complication: Secondary | ICD-10-CM

## 2024-02-23 DIAGNOSIS — S83279S Complex tear of lateral meniscus, current injury, unspecified knee, sequela: Secondary | ICD-10-CM

## 2024-02-23 DIAGNOSIS — I5032 Chronic diastolic (congestive) heart failure: Secondary | ICD-10-CM | POA: Diagnosis not present

## 2024-02-23 DIAGNOSIS — R2681 Unsteadiness on feet: Secondary | ICD-10-CM

## 2024-02-23 DIAGNOSIS — R7989 Other specified abnormal findings of blood chemistry: Secondary | ICD-10-CM | POA: Diagnosis not present

## 2024-02-23 DIAGNOSIS — R2689 Other abnormalities of gait and mobility: Secondary | ICD-10-CM | POA: Insufficient documentation

## 2024-02-23 DIAGNOSIS — R251 Tremor, unspecified: Secondary | ICD-10-CM | POA: Insufficient documentation

## 2024-02-23 LAB — COMPREHENSIVE METABOLIC PANEL WITH GFR
ALT: 18 U/L (ref 0–35)
AST: 22 U/L (ref 0–37)
Albumin: 4.7 g/dL (ref 3.5–5.2)
Alkaline Phosphatase: 119 U/L — ABNORMAL HIGH (ref 39–117)
BUN: 28 mg/dL — ABNORMAL HIGH (ref 6–23)
CO2: 30 meq/L (ref 19–32)
Calcium: 10.3 mg/dL (ref 8.4–10.5)
Chloride: 102 meq/L (ref 96–112)
Creatinine, Ser: 1.33 mg/dL — ABNORMAL HIGH (ref 0.40–1.20)
GFR: 35.79 mL/min — ABNORMAL LOW (ref >60.00)
Glucose, Bld: 138 mg/dL — ABNORMAL HIGH (ref 70–99)
Potassium: 4.2 meq/L (ref 3.5–5.1)
Sodium: 139 meq/L (ref 135–145)
Total Bilirubin: 0.5 mg/dL (ref 0.2–1.2)
Total Protein: 7.3 g/dL (ref 6.0–8.3)

## 2024-02-23 LAB — CBC WITH DIFFERENTIAL/PLATELET
Basophils Absolute: 0.1 10*3/uL (ref 0.0–0.1)
Basophils Relative: 0.6 % (ref 0.0–3.0)
Eosinophils Absolute: 0.1 10*3/uL (ref 0.0–0.7)
Eosinophils Relative: 0.7 % (ref 0.0–5.0)
HCT: 43.4 % (ref 36.0–46.0)
Hemoglobin: 13.8 g/dL (ref 12.0–15.0)
Lymphocytes Relative: 9.4 % — ABNORMAL LOW (ref 12.0–46.0)
Lymphs Abs: 0.8 10*3/uL (ref 0.7–4.0)
MCHC: 31.8 g/dL (ref 30.0–36.0)
MCV: 88.2 fl (ref 78.0–100.0)
Monocytes Absolute: 0.7 10*3/uL (ref 0.1–1.0)
Monocytes Relative: 7.6 % (ref 3.0–12.0)
Neutro Abs: 7.2 10*3/uL (ref 1.4–7.7)
Neutrophils Relative %: 81.7 % — ABNORMAL HIGH (ref 43.0–77.0)
Platelets: 258 10*3/uL (ref 150.0–400.0)
RBC: 4.92 Mil/uL (ref 3.87–5.11)
RDW: 16.9 % — ABNORMAL HIGH (ref 11.5–15.5)
WBC: 8.9 10*3/uL (ref 4.0–10.5)

## 2024-02-23 LAB — HEMOGLOBIN A1C: Hgb A1c MFr Bld: 6.9 % — ABNORMAL HIGH (ref 4.6–6.5)

## 2024-02-23 LAB — VITAMIN B12: Vitamin B-12: 1442 pg/mL — ABNORMAL HIGH (ref 211–911)

## 2024-02-23 LAB — VITAMIN D 25 HYDROXY (VIT D DEFICIENCY, FRACTURES): VITD: 74.92 ng/mL (ref 30.00–100.00)

## 2024-02-23 NOTE — Telephone Encounter (Signed)
 Pt requesting Cardizem CD to be refilled.Pt had new PCP app today with Moshe Cipro, NP.  [facility] notified and staff is forwarding medication request to NP.

## 2024-02-23 NOTE — Telephone Encounter (Signed)
 Copied from CRM [id number]. Topic: Clinical - Medication Refill >> Feb 23, 2024  4:08 PM Denese Killings wrote: Most Recent Primary Care Visit:  Provider: Sherald Barge  Department: [facility]  Visit Type: NEW PT - OFFICE VISIT  Date: 02/23/2024  Medication: diltiazem (CARDIZEM CD) 180 MG 24 hr capsule   Has the patient contacted their pharmacy? Yes (Agent: If no, request that the patient contact the pharmacy for the refill. If patient does not wish to contact the pharmacy document the reason why and proceed with request.) (Agent: If yes, when and what did the pharmacy advise?) prescription expired  Is this the correct pharmacy for this prescription? Yes If no, delete pharmacy and type the correct one.  This is the patient's preferred pharmacy:  [facility] [address] ([address]), [place] - [address] [place] ([place]) [place] [address] Phone: [phone] Fax: [phone]  Has the prescription been filled recently? No  Is the patient out of the medication? No 1 week left  Has the patient been seen for an appointment in the last year OR does the patient have an upcoming appointment? Yes  Can we respond through MyChart? No  Agent: Please be advised that Rx refills may take up to 3 business days. We ask that you follow-up with your pharmacy.

## 2024-02-23 NOTE — Telephone Encounter (Signed)
 Copied from CRM [id number]. Topic: Clinical - Medication Refill >> Feb 23, 2024  4:04 PM Denese Killings wrote: Most Recent Primary Care Visit:  Provider: Sherald Barge  Department: [facility]  Visit Type: NEW PT - OFFICE VISIT  Date: 02/23/2024  Medication: diltiazem (CARDIZEM CD) 180 MG 24 hr capsule  Has the patient contacted their pharmacy? No (Agent: If no, request that the patient contact the pharmacy for the refill. If patient does not wish to contact the pharmacy document the reason why and proceed with request.) (Agent: If yes, when and what did the pharmacy advise?) prescription expired  Is this the correct pharmacy for this prescription? Yes If no, delete pharmacy and type the correct one.  This is the patient's preferred pharmacy:  [facility] [address] ([address] [place] - [address] [place] ([place]) [place] [address] Phone: [phone] Fax: [phone]  Has the prescription been filled recently? No  Is the patient out of the medication? No 1 week left   Has the patient been seen for an appointment in the last year OR does the patient have an upcoming appointment? Yes  Can we respond through MyChart? No  Agent: Please be advised that Rx refills may take up to 3 business days. We ask that you follow-up with your pharmacy.

## 2024-02-23 NOTE — Telephone Encounter (Signed)
 Prescription Request  02/23/2024  LOV: 02/23/2024  What is the name of the medication or equipment? diltiazem (CARDIZEM CD) 180 MG 24 hr capsule   Have you contacted your pharmacy to request a refill? No   Which pharmacy would you like this sent to?  [facility] [address] ([address]), [place] - [address]  [place]) [place] [address] Phone: [phone] Fax: [phone]    Patient notified that their request is being sent to the clinical staff for review and that they should receive a response within 2 business days.   Please advise at Mobile [phone] (mobile)

## 2024-02-23 NOTE — Telephone Encounter (Signed)
 Erroneous encounter. Med refill sent twice.

## 2024-02-24 LAB — IRON,TIBC AND FERRITIN PANEL
%SAT: 9 % — ABNORMAL LOW (ref 16–45)
Ferritin: 30 ng/mL (ref 16–288)
Iron: 34 ug/dL — ABNORMAL LOW (ref 45–160)
TIBC: 369 ug/dL (ref 250–450)

## 2024-02-25 ENCOUNTER — Other Ambulatory Visit: Payer: Self-pay

## 2024-02-25 MED ORDER — DILTIAZEM HCL ER COATED BEADS 180 MG PO CP24: ORAL_CAPSULE | ORAL | 3 refills | Status: DC

## 2024-02-25 NOTE — Telephone Encounter (Signed)
 Medication has been sent in

## 2024-02-29 ENCOUNTER — Telehealth: Payer: Self-pay | Admitting: Family Medicine

## 2024-02-29 NOTE — Telephone Encounter (Signed)
 Copied from CRM [id number]. Topic: Clinical - Lab/Test Results >> Feb 29, 2024 11:58 AM Varney Gentleman wrote: Reason for CRM: Patients daughter Sallyanne Creamer returning call to office regarding lab results, Sallyanne Creamer states she's off tomorrow so it's fine if call is returned tomorrow 4/15 thanks.  Sallyanne Creamer [phone]

## 2024-03-01 NOTE — Telephone Encounter (Signed)
 Spoke with patients daughter Sallyanne Creamer

## 2024-03-03 ENCOUNTER — Other Ambulatory Visit: Payer: Self-pay | Admitting: Family Medicine

## 2024-03-03 DIAGNOSIS — Z1231 Encounter for screening mammogram for malignant neoplasm of breast: Secondary | ICD-10-CM

## 2024-03-14 ENCOUNTER — Encounter: Payer: Medicare Other | Admitting: Internal Medicine

## 2024-03-21 ENCOUNTER — Encounter: Payer: Self-pay | Admitting: Cardiovascular Disease

## 2024-03-21 ENCOUNTER — Ambulatory Visit: Payer: Medicare Other | Attending: Cardiovascular Disease | Admitting: Cardiovascular Disease

## 2024-03-21 VITALS — BP 128/86 | HR 96 | Ht 60.0 in | Wt 129.6 lb

## 2024-03-21 DIAGNOSIS — I5032 Chronic diastolic (congestive) heart failure: Secondary | ICD-10-CM | POA: Diagnosis not present

## 2024-03-21 DIAGNOSIS — D6869 Other thrombophilia: Secondary | ICD-10-CM | POA: Diagnosis not present

## 2024-03-21 DIAGNOSIS — I700 Atherosclerosis of aorta: Secondary | ICD-10-CM

## 2024-03-21 DIAGNOSIS — I10 Essential (primary) hypertension: Secondary | ICD-10-CM

## 2024-03-21 DIAGNOSIS — Z9229 Personal history of other drug therapy: Secondary | ICD-10-CM

## 2024-03-21 DIAGNOSIS — E1122 Type 2 diabetes mellitus with diabetic chronic kidney disease: Secondary | ICD-10-CM

## 2024-03-21 DIAGNOSIS — N183 Chronic kidney disease, stage 3 unspecified: Secondary | ICD-10-CM

## 2024-03-21 DIAGNOSIS — I484 Atypical atrial flutter: Secondary | ICD-10-CM

## 2024-03-21 DIAGNOSIS — I4819 Other persistent atrial fibrillation: Secondary | ICD-10-CM

## 2024-03-21 MED ORDER — AMIODARONE HCL 200 MG PO TABS: 200.0000 mg | ORAL_TABLET | Freq: Two times a day (BID) | ORAL | 3 refills | Status: DC

## 2024-03-21 NOTE — Progress Notes (Signed)
 Cardiology Office Note    Date:  03/26/2024   ID:  Briana [name], DOB Jan 17, 1934, MRN [id number]  PCP:  [name], FNP  Cardiologist:  Magnus Schuller, MD   F/U cardiology evaluation, initially referred through the courtesy of Santina Cull, PA-C for evaluation of chest pain and exertional shortness of breath.  History of Present Illness:  Briana [name] is a 89 y.o. female who I last saw on Mar 19, 2023.  She presents for a 1 year follow-up evaluation.  Briana [name] has a history of hypertension, hyperlipidemia, varicose veins s/p surgery, and GERD.  She has noticed development of shortness of breath with exertion, fatigue, and has experienced some left upper chest pain radiating to her left shoulder which she describes as pins or sticking into her.  She is able to walk down.  Her long driveway without difficulty.  She denies any specific chest pain with walking and she walks 1 mile at times without difficulty.  She was recently evaluated by Santina Cull and because of this persistent increasing symptomatology in light of her age and risk factors she was referred for cardiology evaluation and consideration for stress testing.  She underwent a nuclear stress test on 08/18/2017.  This was entirely normal and showed an EF of 70%, no ST segment changes and had normal myocardial perfusion without scar or ischemia.  A 2-D echo Doppler study on 08/20/2017 showed an EF of 60-65%.  There was grade 2 diastolic dysfunction.  She had mild aortic insufficiency, mild MR, trivial TR and incidentally a trivial pericardial effusion was identified.  I saw her in October 2018 at which time she did not have  any recurrent chest pain suggestive of angina.  He did have some right-sided musculoskeletal costochondral tenderness.  Over the past 2 years, she has been evaluated numerous times with Hao Meng, PA, and more recently Lawana Pray, NP.  She had developed atrial fibrillation in January 2020.  An  echo Doppler study on January 6, /2020 showed an EF of 60 to 65%.  Diastolic function could not be assessed due to the underlying atrial fibrillation.  There was mild left ventricular high hypertrophy.  She did not have any wall motion abnormalities.  There was trivial AR, mild left atrial dilatation she underwent cardioversion on December 20, 2018 but states she only stayed in sinus rhythm for approximately 1 week.  She underwent repeat cardioversion on June 16, 2019 and has been maintaining sinus rhythm since that time.  She has been on amiodarone .  She has had issues with sinus bradycardia.  An initial attempt at stopping her atenolol  resulted in exacerbation of her blood pressure control and as result she was later restarted back on atenolol  therapy.  She saw Lawana Pray, NP on September 12, 2019 at which time she was hypertensive and her amlodipine  dose was increased to 5 mg twice a day.  She last saw him on September 28, 2019.    I saw her on December 29, 2019.  She  has been measuring her blood pressure and pulse rate at home.  Typically her pulse runs in the 50s but there is a rare occasion where it drops to 49.  Her blood pressures recently have been elevated ranging from 140 - 170.  She denied chest pain PND orthopnea.  She denied bleeding.  During that evaluation I recommended that if her resting pulse is below 50 she reduce atenolol  to 12.5 mg.  Over the past several months, she has continued to have labile blood pressure with blood pressure readings at home typically ranging from 140 up to 170s systolically.  She denies any anginal type symptoms.  She denies any dizziness.  She had undergone laboratory in January 2021 by her primary physician which showed hemoglobin 13.8 hematocrit 41.9.  Glucose was 114.  Creatinine 0.99.  LFTs were normal.  TSH was 1.13.  Lipid studies showed a total cholesterol 214, HDL 60, triglycerides 102, and LDL 134.  During that evaluation she was bradycardic with pulse of 46  and I recommended she decrease atenolol  down to 12.5 mg daily.  She was on amiodarone  200 mg.  With continued blood pressure elevation I recommended slight titration of losartan  to 75 mg and amlodipine  to 7.5 mg.  She underwent an echo Doppler study on January 24, 2021 which showed an EF of 55%.  She had moderate left atrial dilatation and mild right atrial dilatation.  There was mild to moderate MR.  She has been followed by Ervin Heath, PA and Lawana Pray, NP on several occasions and last saw Lawana Pray, NP in November 2023 in follow-up of her chronic diastolic heart failure, kidney insufficiency, and PAF.  She was on Eliquis  for anticoagulation.  The patient's 3 daughters live out of state with 1 in [place] , another in [address], and another in the [place].  She was  evaluated by Dr. Vangie Genet on March 11, 2023.  Laboratory in November 2023 showed LDL at 110 with total cholesterol 195 triglycerides 96.  Hemoglobin A1c was 6.6.  Most recent laboratory on March 11, 2023 showed a creatinine of 1.68, glucose 116.  LDL cholesterol was 106.  TSH 1.26.  Hemoglobin A1c 7.9.  Vitamin D  49.  PTH was significantly increased at 277 with calcium 10.2.  I last saw her on Mar 19, 2023.  She was with her daughter who stated  her mother has been experiencing an irregular heart rhythm perhaps for several months.  She currently is on amiodarone  200 mg daily, diltiazem  180 mg, Lasix  80 mg twice a day and Eliquis  2.5 mg twice a day.  She takes as needed albuterol .  ECG showed atrial flutter with variable block .  Amiodarone  was increased and she was referred for A-fib clinic.  She was seen in A-fib clinic and her was Eliquis  was changed to Xarelto  with creatinine vacillating around 1.5.  She underwent DC cardioversion on May 17, 2023 and was maintaining sinus rhythm when reassessed in A-fib clinic on June 07, 2023.  She last saw Dr. Cassondra Cliff on September 14, 2023, and unfortunately he since has passed  away.  The patient comes to the office today and is on a regimen of amiodarone  200 mg daily, diltiazem  CD1 80 mg daily, furosemide  40 mg, and Xarelto  15 mg daily.  She is aware that heart rate has been increased in the 90s.  She denies chest tightness or bleeding.  She presents for evaluation.  Past Medical History:  Diagnosis Date   Acute on chronic diastolic (congestive) heart failure (HCC) 11/21/2018   Cancer (HCC)    skin cancer   DDD (degenerative disc disease)    Diverticula of colon    Diverticulitis    DJD (degenerative joint disease)    GERD (gastroesophageal reflux disease)    takes ranitidine   Heart murmur    History of hiatal hernia    History of pneumonia    Hx of irritable bowel syndrome  Hyperlipidemia    Hypertension    Occasional tremors    Osteoporosis    Pre-diabetes    Varicose veins    Vitamin D  deficiency     Past Surgical History:  Procedure Laterality Date   APPENDECTOMY     BREAST EXCISIONAL BIOPSY Right    BREAST SURGERY Right    fluid drained from breast   CARDIOVERSION N/A 12/20/2018   Procedure: CARDIOVERSION;  Surgeon: Sheryle Donning, MD;  Location: [facility] ENDOSCOPY;  Service: Cardiovascular;  Laterality: N/A;   CARDIOVERSION N/A 06/16/2019   Procedure: CARDIOVERSION;  Surgeon: Elmyra Haggard, MD;  Location: [facility] ENDOSCOPY;  Service: Cardiovascular;  Laterality: N/A;   CARDIOVERSION N/A 05/17/2023   Procedure: CARDIOVERSION;  Surgeon: Elmyra Haggard, MD;  Location[facility] INVASIVE CV LAB;  Service: Cardiovascular;  Laterality: N/A;   COLONOSCOPY     ESOPHAGOGASTRODUODENOSCOPY     FIDUCIAL MARKER PLACEMENT  11/20/2023   Procedure: CLIP MARKER PLACEMENT;  Surgeon: Alvis Jourdain, MD;  Location: [facility] ENDOSCOPY;  Service: Gastroenterology;;   Milta Alosa SIGMOIDOSCOPY N/A 03/09/2021   Procedure: FLEXIBLE SIGMOIDOSCOPY;  Surgeon: Sergio Dandy, MD;  Location: [facility] ENDOSCOPY;  Service: Endoscopy;  Laterality: N/A;   FLEXIBLE SIGMOIDOSCOPY N/A 11/20/2023    Procedure: FLEXIBLE SIGMOIDOSCOPY;  Surgeon: Alvis Jourdain, MD;  Location: [facility] ENDOSCOPY;  Service: Gastroenterology;  Laterality: N/A;   HEMOSTASIS CLIP PLACEMENT  03/09/2021   Procedure: HEMOSTASIS CLIP PLACEMENT;  Surgeon: Sergio Dandy, MD;  Location: [facility] ENDOSCOPY;  Service: Endoscopy;;   JOINT REPLACEMENT Left    left hip   LUMBAR LAMINECTOMY/DECOMPRESSION MICRODISCECTOMY N/A 06/05/2015   Procedure: L3-L5 DECOMPRESSION ;  Surgeon: Mort Ards, MD;  Location: [facility] OR;  Service: Orthopedics;  Laterality: N/A;   OPEN REDUCTION INTERNAL FIXATION (ORIF) DISTAL RADIAL FRACTURE Right 01/10/2014   Procedure: OPEN REDUCTION INTERNAL FIXATION (ORIF) DISTAL RADIAL FRACTURE;  Surgeon: Shellie Dials, MD;  Location: [facility] OR;  Service: Orthopedics;  Laterality: Right;   SPINAL CORD DECOMPRESSION  06/05/2015   L3 L 5   THYROID  SURGERY     TONSILLECTOMY     TUBAL LIGATION     varicose veins stripped     WRIST FRACTURE SURGERY Bilateral     Current Medications: Outpatient Medications Prior to Visit  Medication Sig Dispense Refill   acetaminophen  (TYLENOL ) 500 MG tablet Take 500 mg by mouth every 6 (six) hours as needed for moderate pain.     albuterol  (PROAIR  HFA) 108 (90 Base) MCG/ACT inhaler Take  2 inhalations  15 minutes apart  every 4 hours  as needed for Wheezing 48 g 3   Ascorbic Acid  (VITAMIN C  PO) Take 1 tablet by mouth at bedtime.     Boswellia-Glucosamine-Vit D (OSTEO BI-FLEX ONE PER DAY PO) Take 1 tablet by mouth in the morning.     Carboxymeth-Glycerin -Polysorb (REFRESH DIGITAL OP) Place 1 drop into the right eye daily as needed (dry eyes).     cholecalciferol  (VITAMIN D3) 25 MCG (1000 UNIT) tablet Take 3,000 Units by mouth daily.     Cyanocobalamin  (B-12 PO) Take 1 capsule by mouth daily.     diazepam  (VALIUM ) 2 MG tablet Take 1 tablet 3 - 4 x /day for Tremor                                     /  TAKE                    BY                     MOUTH  (Patient taking differently: Take 2 mg by mouth as needed (Tremors).) 120 tablet 1   diltiazem  (CARDIZEM  CD) 180 MG 24 hr capsule Take 1 capsule  Daily for Heart & BP                                                                                                   /                          TAKE                                      BY                                            MOUTH                                                   ONCE ? DAILY 90 capsule 3   Ferrous Sulfate (SLOW RELEASE IRON PO) Take 1 tablet by mouth at bedtime.     furosemide  (LASIX ) 40 MG tablet Take 1 tablet (40 mg total) by mouth daily. 90 tablet 3   loratadine (CLARITIN) 10 MG tablet Take 10 mg by mouth daily as needed for allergies.     Multiple Vitamins-Minerals (ZINC PO) Take 1 capsule by mouth daily.     polyethylene glycol (MIRALAX  / GLYCOLAX ) 17 g packet Take 17 g by mouth daily. (Patient taking differently: Take 17 g by mouth daily as needed for mild constipation.) 14 each 0   Pyridoxine HCl (B-6 PO) Take 1 tablet by mouth daily.     Rivaroxaban  (XARELTO ) 15 MG TABS tablet TAKE 1 TABLET BY MOUTH ONCE DAILY WITH  SUPPER  STOP  ELIQUIS  30 tablet 11   Simethicone (GAS-X PO) Take 1 tablet by mouth daily as needed (gas).     amiodarone  (PACERONE ) 200 MG tablet Take 1 tablet by mouth once daily 90 tablet 1   No facility-administered medications prior to visit.     Allergies:   Codeine, Accupril [quinapril hcl], Prednisone , Reglan [metoclopramide], Tramadol , Adhesive [tape], and Neosporin [neomycin-bacitracin zn-polymyx]   Social History   Socioeconomic History   Marital status: Widowed    Spouse name: Not on file   Number of children: Not on file   Years of education: Not on file   Highest education level: Not on file  Occupational History   Not on file  Tobacco Use  Smoking status: Never   Smokeless tobacco: Never   Tobacco comments:    Never smoked 09/13/23  Vaping Use   Vaping status: Never Used   Substance and Sexual Activity   Alcohol  use: No   Drug use: No   Sexual activity: Not on file  Other Topics Concern   Not on file  Social History Narrative   Not on file   Social Drivers of Health   Financial Resource Strain: Not on file  Food Insecurity: No Food Insecurity (11/21/2023)   Hunger Vital Sign    Worried About Running Out of Food in the Last Year: Never true    Ran Out of Food in the Last Year: Never true  Transportation Needs: No Transportation Needs (11/21/2023)   PRAPARE - Administrator, Civil Service (Medical): No    Lack of Transportation (Non-Medical): No  Physical Activity: Not on file  Stress: Not on file  Social Connections: Moderately Integrated (11/21/2023)   Social Connection and Isolation Panel [NHANES]    Frequency of Communication with Friends and Family: More than three times a week    Frequency of Social Gatherings with Friends and Family: Twice a week    Attends Religious Services: More than 4 times per year    Active Member of [employer] or Organizations: Yes    Attends Banker Meetings: More than 4 times per year    Marital Status: Widowed     Family History:  The patient's family history includes Cancer in her brother; Heart disease in her sister; Hyperlipidemia in her mother; Hypertension in her mother.   ROS General: Negative; No fevers, chills, or night sweats;  HEENT: Negative; Positive for wearing hearing aids.  No changes in vision, sinus congestion, difficulty swallowing Pulmonary: Positive for when necessary inhalation treatment with albuterol ; No recent cough, wheezing, shortness of breath, hemoptysis Cardiovascular: See history of present illness GI: Positive for GERD, history of irritable bowel syndrome GU: Negative; No dysuria, hematuria, or difficulty voiding Musculoskeletal: Straight of lumbar stenosis Hematologic/Oncology: Negative; no easy bruising, bleeding Endocrine: Negative; no heat/cold intolerance; no  diabetes Neuro: Negative; no changes in balance, headaches Skin: Negative; No rashes or skin lesions Psychiatric: Negative; No behavioral problems, depression Sleep: Negative; No snoring, daytime sleepiness, hypersomnolence, bruxism, restless legs, hypnogognic hallucinations, no cataplexy Other comprehensive 14 point system review is negative.   PHYSICAL EXAM:   VS:  BP 128/86   Pulse 96   Ht 5' (1.524 m)   Wt 129 lb 9.6 oz (58.8 kg)   SpO2 96%   BMI 25.31 kg/m     Repeat blood pressure by me was 126/80  Wt Readings from Last 3 Encounters:  03/21/24 129 lb 9.6 oz (58.8 kg)  02/23/24 128 lb 6.4 oz (58.2 kg)  11/25/23 129 lb (58.5 kg)   General: Alert, oriented, no distress.  Skin: normal turgor, no rashes, warm and dry HEENT: Normocephalic, atraumatic. Pupils equal round and reactive to light; sclera anicteric; extraocular muscles intact;  Nose without nasal septal hypertrophy Mouth/Parynx benign; Mallinpatti scale 3 Neck: No JVD, no carotid bruits; normal carotid upstroke Lungs: clear to ausculatation and percussion; no wheezing or rales Chest wall: without tenderness to palpitation Heart: PMI not displaced, overall regular rhythm in the 90s, s1 s2 normal, 1/6 systolic murmur, no diastolic murmur, no rubs, gallops, thrills, or heaves Abdomen: soft, nontender; no hepatosplenomehaly, BS+; abdominal aorta nontender and not dilated by palpation. Back: no CVA tenderness Pulses 2+ Musculoskeletal: full range of motion,  normal strength, no joint deformities Extremities: no clubbing cyanosis or edema, Homan's sign negative  Neurologic: grossly nonfocal; Cranial nerves grossly wnl Psychologic: Normal mood and affect    Studies/Labs Reviewed:   EKG Interpretation Date/Time:  Tuesday Mar 21 2024 11:34:26 EDT Ventricular Rate:  96 PR Interval:  124 QRS Duration:  106 QT Interval:  392 QTC Calculation: 495 R Axis:   267  Text Interpretation: Atrial flutter with 2-1 block  followed by sinus rhythm Right superior axis deviation Pulmonary disease pattern Incomplete right bundle branch block Right ventricular hypertrophy Septal infarct (cited on or before 17-May-2023) Inferior infarct , age undetermined When compared with ECG of 13-Sep-2023 11:28, Significant changes have occurred Reconfirmed by Magnus Schuller ([id number]) on 03/26/2024 10:26:08 AM    Mar 19, 2023 ECG (independently read by me):  Atrial flutter with variable block   December 29, 2019 ECG (independently read by me): Sinus bradycardia at 46 bpm.  Left axis deviation.  Mild RV conduction delay.  QS V1 V3.  QTc interval 462 ms.  October 17, 2019 ECG (independently read by me): Sinus bradycardia at 48 bpm.  Incomplete right bundle branch block.  Left anterior hemiblock.  Q waves anteroseptally  October 2018 EKG:  EKG is ordered today.  Normal sinus rhythm at 62 bpm with PAC.  Incomplete right bundle branch block.  Normal intervals  September 2018 ECG (independently read by me):Normal sinus rhythm at 63 bpm.  Incomplete right bundle branch block.  Poor anterior R-wave progression.  Normal intervals.  Recent Labs:    Latest Ref Rng & Units 02/23/2024   11:02 AM 11/25/2023   12:32 PM 11/22/2023    3:11 AM  BMP  Glucose 70 - 99 mg/dL 578  469  629   BUN 6 - 23 mg/dL 28  22  25    Creatinine 0.40 - 1.20 mg/dL 5.28  4.13  2.44   BUN/Creat Ratio 6 - 22 (calc)  15    Sodium 135 - 145 mEq/L 139  140  135   Potassium 3.5 - 5.1 mEq/L 4.2  4.8  4.2   Chloride 96 - 112 mEq/L 102  104  105   CO2 19 - 32 mEq/L 30  27  23    Calcium 8.4 - 10.5 mg/dL 01.0  9.9  9.2         Latest Ref Rng & Units 02/23/2024   11:02 AM 11/25/2023   12:32 PM 11/21/2023    8:52 AM  Hepatic Function  Total Protein 6.0 - 8.3 g/dL 7.3  6.2  5.1   Albumin  3.5 - 5.2 g/dL 4.7   3.1   AST 0 - 37 U/L 22  19  19    ALT 0 - 35 U/L 18  17  18    Alk Phosphatase 39 - 117 U/L 119   74   Total Bilirubin 0.2 - 1.2 mg/dL 0.5  0.4  0.8        Latest Ref Rng  & Units 02/23/2024   11:02 AM 11/25/2023   12:32 PM 11/22/2023    3:11 AM  CBC  WBC 4.0 - 10.5 K/uL 8.9  5.8  6.6   Hemoglobin 12.0 - 15.0 g/dL 27.2  9.6  8.6   Hematocrit 36.0 - 46.0 % 43.4  30.1  26.7   Platelets 150.0 - 400.0 K/uL 258.0  233  179    Lab Results  Component Value Date   MCV 88.2 02/23/2024   MCV 94.1 11/25/2023   MCV 94.3  11/22/2023   Lab Results  Component Value Date   TSH 1.03 09/14/2023   Lab Results  Component Value Date   HGBA1C 6.9 (H) 02/23/2024     BNP    Component Value Date/Time   BNP 464.4 (H) 01/22/2021 1745    ProBNP No results found for: "PROBNP"   Lipid Panel     Component Value Date/Time   CHOL 187 09/14/2023 1137   TRIG 55 09/14/2023 1137   HDL 72 09/14/2023 1137   CHOLHDL 2.6 09/14/2023 1137   VLDL 15 05/04/2017 1339   LDLCALC 101 (H) 09/14/2023 1137     RADIOLOGY: No results found.   Additional studies/ records that were reviewed today include:  I reviewed the records from Santina Cull, [place]  I have reviewed the subsequent  office visits since her last appointment with me in February 2021.  ASSESSMENT:    1. Essential hypertension   2. Atypical atrial flutter (HCC)   3. Chronic diastolic heart failure (HCC)   4. Hypercoagulable state due to persistent atrial fibrillation/flutter (HCC)   5. Aortic atherosclerosis (HCC) by Chest CTA on 11/21/2018.    6. CKD stage 3 b due to type 2 diabetes mellitus (HCC)   7. amiodarone  therapy     PLAN:  Ms. Joslin Beaty is a very pleasant 89 year-old female who has a longstanding history of hypertension as well as a history of hyperlipidemia, prior varicose vein surgery, GERD, and in the past had developed musculoskeletal chest wall pain.  She  developed atrial fibrillation and required cardioversion initially in February 2020 and subsequently on June 16, 2019.  When I saw her in December 2020 she was maintaining sinus rhythm on a regimen consisting of amiodarone  200 mg daily, in  addition to atenolol  25 mg daily.  She was bradycardic and I recommended potential reduction of a atenolol .  She has been on Eliquis  as well as baby aspirin .  She does not have underlying known CAD and I recommended she discontinue aspirin  and continue with Eliquis  5 mg twice a day alone to reduce bleed risk.  When last seen by me in 2021, she was bradycardic and atenolol  was reduced.  When last seen by Lawana Pray, NP in November 2023 following an ER visit with abdominal pain, no ECG was obtained but her rhythm was reportedly regular.  Her blood pressure was stable.  When last seen by me in May 2024 she was in atrial flutter with variable block.  She ultimately was referred to A-fib clinic.  She was changed from Eliquis  due to Xarelto  in light of renal function with creatinine vacillating around 1.5.  She had undergone successful cardioversion in July 2024.  Presently, she does admit at times her heart rate is fast.  Her EKG today shows atrial flutter 2-1 block as well as some variability and then it appears that she has sinus beats following her a flutter.  Presently, I will titrate her amiodarone  to 200 mg twice a day.  I am again referring her to atrial fibrillation clinic to be seen in the next 1-2 weeks.  She continues to be on Xarelto  at 15 mg dosing with most recent creatinine at 1.33.  I am also scheduling her for a follow-up echo Doppler study for reassessment of LV systolic and diastolic function.  She is aware of my upcoming retirement and I will transition her to the future care of Dr. Alvis Ba.      Medication Adjustments/Labs and Tests Ordered: Current medicines are  reviewed at length with the patient today.  Concerns regarding medicines are outlined above.  Medication changes, Labs and Tests ordered today are listed in the Patient Instructions below. Patient Instructions  Medication Instructions:  - START AMIODARONE  200 MG BY MOUTH TWICE DAILY    *If you need a refill on your cardiac  medications before your next appointment, please call your pharmacy*   Lab Work: NONE    If you have labs (blood work) drawn today and your tests are completely normal, you will receive your results only by: MyChart Message (if you have MyChart) OR A paper copy in the mail If you have any lab test that is abnormal or we need to change your treatment, we will call you to review the results.   Testing/Procedures: Echo will be scheduled at [address].  Your physician has requested that you have an echocardiogram. Echocardiography is a painless test that uses sound waves to create images of your heart. It provides your doctor with information about the size and shape of your heart and how well your heart's chambers and valves are working. This procedure takes approximately one hour. There are no restrictions for this procedure. Please do NOT wear cologne, perfume, aftershave, or lotions (deodorant is allowed). Please arrive 15 minutes prior to your appointment time.    Follow-Up: At [facility], you and your health needs are our priority.  As part of our continuing mission to provide you with exceptional heart care, we have created designated Provider Care Teams.  These Care Teams include your primary Cardiologist (physician) and Advanced Practice Providers (APPs -  Physician Assistants and Nurse Practitioners) who all work together to provide you with the care you need, when you need it.  We recommend signing up for the patient portal called "MyChart".  Sign up information is provided on this After Visit Summary.  MyChart is used to connect with patients for Virtual Visits (Telemedicine).  Patients are able to view lab/test results, encounter notes, upcoming appointments, etc.  Non-urgent messages can be sent to your provider as well.   To learn more about what you can do with MyChart, go to [email or web address].    Your next appointment:   1 week(s)  The format for  your next appointment:   In Person  Provider:   You will follow up in the Atrial Fibrillation Clinic located at [facility]. Your provider will be: Clint R. Fenton, PA-C or Minnie Amber, PA-C   Other Instructions  Please make note to schedule with DR. MIHAI CROITORU after afib clinic appointment. Afib will determine timeframe of appointment with Dr. Alvis Ba.    Signed, Magnus Schuller, MD  03/26/2024 10:38 AM    [facility] [address], [place], [place]  [address] Phone: [phone]

## 2024-03-21 NOTE — Patient Instructions (Signed)
 Medication Instructions:  - START AMIODARONE  200 MG BY MOUTH TWICE DAILY    *If you need a refill on your cardiac medications before your next appointment, please call your pharmacy*   Lab Work: NONE    If you have labs (blood work) drawn today and your tests are completely normal, you will receive your results only by: MyChart Message (if you have MyChart) OR A paper copy in the mail If you have any lab test that is abnormal or we need to change your treatment, we will call you to review the results.   Testing/Procedures: Echo will be scheduled at [address].  Your physician has requested that you have an echocardiogram. Echocardiography is a painless test that uses sound waves to create images of your heart. It provides your doctor with information about the size and shape of your heart and how well your heart's chambers and valves are working. This procedure takes approximately one hour. There are no restrictions for this procedure. Please do NOT wear cologne, perfume, aftershave, or lotions (deodorant is allowed). Please arrive 15 minutes prior to your appointment time.    Follow-Up: At [facility], you and your health needs are our priority.  As part of our continuing mission to provide you with exceptional heart care, we have created designated Provider Care Teams.  These Care Teams include your primary Cardiologist (physician) and Advanced Practice Providers (APPs -  Physician Assistants and Nurse Practitioners) who all work together to provide you with the care you need, when you need it.  We recommend signing up for the patient portal called "MyChart".  Sign up information is provided on this After Visit Summary.  MyChart is used to connect with patients for Virtual Visits (Telemedicine).  Patients are able to view lab/test results, encounter notes, upcoming appointments, etc.  Non-urgent messages can be sent to your provider as well.   To learn more about  what you can do with MyChart, go to [email or web address].    Your next appointment:   1 week(s)  The format for your next appointment:   In Person  Provider:   You will follow up in the Atrial Fibrillation Clinic located at [facility]. Your provider will be: Clint R. Fenton, PA-C or Minnie Amber, PA-C   Other Instructions  Please make note to schedule with DR. MIHAI CROITORU after afib clinic appointment. Afib will determine timeframe of appointment with Dr. Alvis Ba.

## 2024-03-26 ENCOUNTER — Encounter: Payer: Self-pay | Admitting: Cardiovascular Disease

## 2024-03-28 ENCOUNTER — Encounter (HOSPITAL_COMMUNITY): Payer: Self-pay | Admitting: Physician Assistant

## 2024-03-28 ENCOUNTER — Ambulatory Visit (HOSPITAL_COMMUNITY)
Admission: RE | Admit: 2024-03-28 | Discharge: 2024-03-28 | Disposition: A | Discharge status: Home / Self Care | Source: Ambulatory Visit | Attending: Physician Assistant | Admitting: Physician Assistant

## 2024-03-28 VITALS — BP 138/98 | HR 94 | Ht 60.0 in | Wt 131.0 lb

## 2024-03-28 DIAGNOSIS — D6869 Other thrombophilia: Secondary | ICD-10-CM

## 2024-03-28 DIAGNOSIS — Z5181 Encounter for therapeutic drug level monitoring: Secondary | ICD-10-CM | POA: Diagnosis not present

## 2024-03-28 DIAGNOSIS — I4819 Other persistent atrial fibrillation: Secondary | ICD-10-CM | POA: Diagnosis not present

## 2024-03-28 DIAGNOSIS — I484 Atypical atrial flutter: Secondary | ICD-10-CM | POA: Diagnosis not present

## 2024-03-28 DIAGNOSIS — Z79899 Other long term (current) drug therapy: Secondary | ICD-10-CM

## 2024-03-28 MED ORDER — AMIODARONE HCL 200 MG PO TABS: 200.0000 mg | ORAL_TABLET | Freq: Every day | ORAL | 2 refills | Status: DC

## 2024-03-28 NOTE — H&P (View-Only) (Signed)
 Primary Care Physician: [name], FNP Primary Cardiologist: Dr Loetta Ringer Primary Electrophysiologist: none Referring Physician: Dr Orman Blacksmith is a 89 y.o. female with a history of HTN, HLD, DM, chronic HFpEF, atrial fibrillation who presents for follow up in the [facility]ibr[facility].  The patient was initially diagnosed with atrial fibrillation in 2020 and underwent DCCV x 2. She has been maintained on amiodarone . Patient is on Xarelto  for stroke prevention. She was seen by Dr Loetta Ringer on 03/19/23 and found to be in rate controlled atrial flutter. Her amiodarone  was increased and she was referred to the AF clinic. Her Eliquis  was changed to Xarelto  since her Cr was vacillating around 1.5. S/p DCCV on 05/17/23.  Patient returns for follow up for atrial fibrillation and amiodarone  monitoring. She was seen by Dr Loetta Ringer on 03/21/24 and found to be back in atrial flutter. Her amiodarone  was increased. She remains in atrial flutter today. She has noticed more fatigue when walking to her mailbox recently. No bleeding issues on anticoagulation. There were no specific triggers that she could identify.   Today, she  denies symptoms of palpitations, chest pain, shortness of breath, orthopnea, PND, lower extremity edema, dizziness, presyncope, syncope, snoring, daytime somnolence, bleeding, or neurologic sequela. The patient is tolerating medications without difficulties and is otherwise without complaint today.    Atrial Fibrillation Risk Factors:  she does not have symptoms or diagnosis of sleep apnea. she does not have a history of rheumatic fever. she does not have a history of alcohol  use. The patient does not have a history of early familial atrial fibrillation or other arrhythmias.   Atrial Fibrillation Management history:  Previous antiarrhythmic drugs: amiodarone   Previous cardioversions: 2020 x 2, 05/17/23 Previous ablations: none Anticoagulation history:  Eliquis , Xarelto     Past Medical History:  Diagnosis Date   Acute on chronic diastolic (congestive) heart failure (HCC) 11/21/2018   Cancer (HCC)    skin cancer   DDD (degenerative disc disease)    Diverticula of colon    Diverticulitis    DJD (degenerative joint disease)    GERD (gastroesophageal reflux disease)    takes ranitidine   Heart murmur    History of hiatal hernia    History of pneumonia    Hx of irritable bowel syndrome    Hyperlipidemia    Hypertension    Occasional tremors    Osteoporosis    Pre-diabetes    Varicose veins    Vitamin D  deficiency     ROS- All systems are reviewed and negative except as per the HPI above.  Physical Exam: Vitals:   03/28/24 1503  BP: (!) 138/98  Pulse: 94  Weight: 59.4 kg  Height: 5' (1.524 m)    GEN: Well nourished, well developed in no acute distress CARDIAC: Irregularly irregular rate and rhythm, no murmurs, rubs, gallops RESPIRATORY:  Clear to auscultation without rales, wheezing or rhonchi  ABDOMEN: Soft, non-tender, non-distended EXTREMITIES:  No edema; No deformity    Wt Readings from Last 3 Encounters:  03/28/24 59.4 kg  03/21/24 58.8 kg  02/23/24 58.2 kg    EKG today demonstrates  Atypical atrial flutter with 2:1 block, RBBB Vent. rate 94 BPM PR interval 244 ms QRS duration 126 ms QT/QTcB 382/477 ms   Echo 04/08/23 demonstrated  1. Atrial flutter controlled rate. Left ventricular ejection fraction, by estimation, is 60 to 65%. The left ventricle has normal function. The left ventricle has no regional wall motion  abnormalities. There is mild concentric left ventricular hypertrophy. Left ventricular diastolic  parameters are indeterminate.   2. Right ventricular systolic function is mildly reduced. The right  ventricular size is normal. There is normal pulmonary artery systolic  pressure.   3. Left atrial size was severely dilated.   4. The mitral valve is normal in structure. Mild mitral valve   regurgitation. No evidence of mitral stenosis.   5. The aortic valve is tricuspid. There is mild thickening of the aortic  valve. Aortic valve regurgitation is mild. No aortic stenosis is present.   6. Aortic Normal DTA.   7. The inferior vena cava is normal in size with greater than 50%  respiratory variability, suggesting right atrial pressure of 3 mmHg.   Comparison(s): EF 55%, mild LVH, mild-mod MR, RVSP 53.4 mmHg.   Epic records are reviewed at length today.  CHA2DS2-VASc Score = 6  The patient's score is based upon: CHF History: 1 HTN History: 1 Diabetes History: 1 Stroke History: 0 Vascular Disease History: 0 Age Score: 2 Gender Score: 1       ASSESSMENT AND PLAN: Persistent Atrial Fibrillation/atrial flutter The patient's CHA2DS2-VASc score is 6, indicating a 9.7% annual risk of stroke.   Patient remains in atrial flutter. We discussed rhythm control options. Will plan for DCCV.  She will decrease amiodarone  to 200 mg once daily day of DCCV.  Continue diltiazem  180 mg daily Continue Xarelto  15 mg daily, she denies any missed doses in the past 3 weeks.  Check bmet/cbc/TSH  Secondary Hypercoagulable State (ICD10:  D68.69) The patient is at significant risk for stroke/thromboembolism based upon her CHA2DS2-VASc Score of 6.  Continue Rivaroxaban  (Xarelto ). No bleeding issues.   High Risk Medication Monitoring (ICD 10: Z79.899) Intervals on ECG acceptable for amiodarone  monitoring. Check TSH as above. Recent liver function on cmet reviewed.   Chronic HFpEF EF 60-65% GDMT per primary cardiology team Fluid status appears stable today  HTN Stable on current regimen    Follow up in the AF clinic post DCCV.     Myrtha Ates PA-C Afib [facility] [address] [place], [place] [address] [phone] 03/28/2024 3:17 PM

## 2024-03-28 NOTE — Progress Notes (Signed)
 Primary Care Physician: [name], FNP Primary Cardiologist: Dr Loetta Ringer Primary Electrophysiologist: none Referring Physician: Dr Orman Blacksmith is a 89 y.o. female with a history of HTN, HLD, DM, chronic HFpEF, atrial fibrillation who presents for follow up in the [facility].  The patient was initially diagnosed with atrial fibrillation in 2020 and underwent DCCV x 2. She has been maintained on amiodarone . Patient is on Xarelto  for stroke prevention. She was seen by Dr Loetta Ringer on 03/19/23 and found to be in rate controlled atrial flutter. Her amiodarone  was increased and she was referred to the AF clinic. Her Eliquis  was changed to Xarelto  since her Cr was vacillating around 1.5. S/p DCCV on 05/17/23.  Patient returns for follow up for atrial fibrillation and amiodarone  monitoring. She was seen by Dr Loetta Ringer on 03/21/24 and found to be back in atrial flutter. Her amiodarone  was increased. She remains in atrial flutter today. She has noticed more fatigue when walking to her mailbox recently. No bleeding issues on anticoagulation. There were no specific triggers that she could identify.   Today, she  denies symptoms of palpitations, chest pain, shortness of breath, orthopnea, PND, lower extremity edema, dizziness, presyncope, syncope, snoring, daytime somnolence, bleeding, or neurologic sequela. The patient is tolerating medications without difficulties and is otherwise without complaint today.    Atrial Fibrillation Risk Factors:  she does not have symptoms or diagnosis of sleep apnea. she does not have a history of rheumatic fever. she does not have a history of alcohol  use. The patient does not have a history of early familial atrial fibrillation or other arrhythmias.   Atrial Fibrillation Management history:  Previous antiarrhythmic drugs: amiodarone   Previous cardioversions: 2020 x 2, 05/17/23 Previous ablations: none Anticoagulation history:  Eliquis , Xarelto     Past Medical History:  Diagnosis Date   Acute on chronic diastolic (congestive) heart failure (HCC) 11/21/2018   Cancer (HCC)    skin cancer   DDD (degenerative disc disease)    Diverticula of colon    Diverticulitis    DJD (degenerative joint disease)    GERD (gastroesophageal reflux disease)    takes ranitidine   Heart murmur    History of hiatal hernia    History of pneumonia    Hx of irritable bowel syndrome    Hyperlipidemia    Hypertension    Occasional tremors    Osteoporosis    Pre-diabetes    Varicose veins    Vitamin D  deficiency     ROS- All systems are reviewed and negative except as per the HPI above.  Physical Exam: Vitals:   03/28/24 1503  BP: (!) 138/98  Pulse: 94  Weight: 59.4 kg  Height: 5' (1.524 m)    GEN: Well nourished, well developed in no acute distress CARDIAC: Irregularly irregular rate and rhythm, no murmurs, rubs, gallops RESPIRATORY:  Clear to auscultation without rales, wheezing or rhonchi  ABDOMEN: Soft, non-tender, non-distended EXTREMITIES:  No edema; No deformity    Wt Readings from Last 3 Encounters:  03/28/24 59.4 kg  03/21/24 58.8 kg  02/23/24 58.2 kg    EKG today demonstrates  Atypical atrial flutter with 2:1 block, RBBB Vent. rate 94 BPM PR interval 244 ms QRS duration 126 ms QT/QTcB 382/477 ms   Echo 04/08/23 demonstrated  1. Atrial flutter controlled rate. Left ventricular ejection fraction, by estimation, is 60 to 65%. The left ventricle has normal function. The left ventricle has no regional wall motion  abnormalities. There is mild concentric left ventricular hypertrophy. Left ventricular diastolic  parameters are indeterminate.   2. Right ventricular systolic function is mildly reduced. The right  ventricular size is normal. There is normal pulmonary artery systolic  pressure.   3. Left atrial size was severely dilated.   4. The mitral valve is normal in structure. Mild mitral valve   regurgitation. No evidence of mitral stenosis.   5. The aortic valve is tricuspid. There is mild thickening of the aortic  valve. Aortic valve regurgitation is mild. No aortic stenosis is present.   6. Aortic Normal DTA.   7. The inferior vena cava is normal in size with greater than 50%  respiratory variability, suggesting right atrial pressure of 3 mmHg.   Comparison(s): EF 55%, mild LVH, mild-mod MR, RVSP 53.4 mmHg.   Epic records are reviewed at length today.  CHA2DS2-VASc Score = 6  The patient's score is based upon: CHF History: 1 HTN History: 1 Diabetes History: 1 Stroke History: 0 Vascular Disease History: 0 Age Score: 2 Gender Score: 1       ASSESSMENT AND PLAN: Persistent Atrial Fibrillation/atrial flutter The patient's CHA2DS2-VASc score is 6, indicating a 9.7% annual risk of stroke.   Patient remains in atrial flutter. We discussed rhythm control options. Will plan for DCCV.  She will decrease amiodarone  to 200 mg once daily day of DCCV.  Continue diltiazem  180 mg daily Continue Xarelto  15 mg daily, she denies any missed doses in the past 3 weeks.  Check bmet/cbc/TSH  Secondary Hypercoagulable State (ICD10:  D68.69) The patient is at significant risk for stroke/thromboembolism based upon her CHA2DS2-VASc Score of 6.  Continue Rivaroxaban  (Xarelto ). No bleeding issues.   High Risk Medication Monitoring (ICD 10: Z79.899) Intervals on ECG acceptable for amiodarone  monitoring. Check TSH as above. Recent liver function on cmet reviewed.   Chronic HFpEF EF 60-65% GDMT per primary cardiology team Fluid status appears stable today  HTN Stable on current regimen    Follow up in the AF clinic post DCCV.     Myrtha Ates PA-C Afib [facility] [address] [place], [place] [address] [phone] 03/28/2024 3:17 PM

## 2024-03-28 NOTE — Patient Instructions (Addendum)
 Decrease Amiodarone  to 200mg  once a day     Cardioversion scheduled for: Wednesday, May 14th   - Arrive at the [facility] of [facility] ([address]  and check in with ADMITTING at 8:30AM   - Do not eat or drink anything after midnight the night prior to your procedure.   - Take all your morning medication (except diabetic medications) with a sip of water  prior to arrival.  - Do NOT miss any doses of your blood thinner - if you should miss a dose or take a dose more than 4 hours late -- please notify our office immediately.  - You will not be able to drive home after your procedure. Please ensure you have a responsible adult to drive you home. You will need someone with you for 24 hours post procedure.     - Expect to be in the procedural area approximately 2 hours.   - If you feel as if you go back into normal rhythm prior to scheduled cardioversion, please notify our office immediately.   If your procedure is canceled in the cardioversion suite you will be charged a cancellation fee.

## 2024-03-29 ENCOUNTER — Ambulatory Visit (HOSPITAL_COMMUNITY)
Admission: RE | Admit: 2024-03-29 | Discharge: 2024-03-29 | Disposition: A | Discharge status: Home / Self Care | Attending: Cardiology | Admitting: Cardiology

## 2024-03-29 ENCOUNTER — Encounter (HOSPITAL_COMMUNITY): Payer: Self-pay | Admitting: Cardiology

## 2024-03-29 ENCOUNTER — Other Ambulatory Visit: Payer: Self-pay

## 2024-03-29 ENCOUNTER — Ambulatory Visit (HOSPITAL_COMMUNITY): Admitting: Anesthesiology

## 2024-03-29 ENCOUNTER — Encounter (HOSPITAL_COMMUNITY)
Admission: RE | Disposition: A | Discharge status: Home / Self Care | Payer: Self-pay | Source: Home / Self Care | Attending: Cardiology

## 2024-03-29 DIAGNOSIS — I110 Hypertensive heart disease with heart failure: Secondary | ICD-10-CM | POA: Diagnosis not present

## 2024-03-29 DIAGNOSIS — I5032 Chronic diastolic (congestive) heart failure: Secondary | ICD-10-CM | POA: Diagnosis not present

## 2024-03-29 DIAGNOSIS — E119 Type 2 diabetes mellitus without complications: Secondary | ICD-10-CM | POA: Diagnosis not present

## 2024-03-29 DIAGNOSIS — I130 Hypertensive heart and chronic kidney disease with heart failure and stage 1 through stage 4 chronic kidney disease, or unspecified chronic kidney disease: Secondary | ICD-10-CM

## 2024-03-29 DIAGNOSIS — J449 Chronic obstructive pulmonary disease, unspecified: Secondary | ICD-10-CM | POA: Insufficient documentation

## 2024-03-29 DIAGNOSIS — I4892 Unspecified atrial flutter: Secondary | ICD-10-CM

## 2024-03-29 DIAGNOSIS — Z7901 Long term (current) use of anticoagulants: Secondary | ICD-10-CM | POA: Insufficient documentation

## 2024-03-29 DIAGNOSIS — Z79899 Other long term (current) drug therapy: Secondary | ICD-10-CM | POA: Diagnosis not present

## 2024-03-29 DIAGNOSIS — I484 Atypical atrial flutter: Secondary | ICD-10-CM

## 2024-03-29 DIAGNOSIS — N183 Chronic kidney disease, stage 3 unspecified: Secondary | ICD-10-CM

## 2024-03-29 DIAGNOSIS — I4819 Other persistent atrial fibrillation: Secondary | ICD-10-CM | POA: Insufficient documentation

## 2024-03-29 DIAGNOSIS — D6869 Other thrombophilia: Secondary | ICD-10-CM | POA: Insufficient documentation

## 2024-03-29 HISTORY — PX: CARDIOVERSION: EP1203

## 2024-03-29 LAB — POCT I-STAT, CHEM 8
BUN: 32 mg/dL — ABNORMAL HIGH (ref 8–23)
Calcium, Ion: 1.34 mmol/L (ref 1.15–1.40)
Chloride: 104 mmol/L (ref 98–111)
Creatinine, Ser: 1.5 mg/dL — ABNORMAL HIGH (ref 0.44–1.00)
Glucose, Bld: 182 mg/dL — ABNORMAL HIGH (ref 70–99)
HCT: 43 % (ref 36.0–46.0)
Hemoglobin: 14.6 g/dL (ref 12.0–15.0)
Potassium: 4.5 mmol/L (ref 3.5–5.1)
Sodium: 140 mmol/L (ref 135–145)
TCO2: 25 mmol/L (ref 22–32)

## 2024-03-29 SURGERY — CARDIOVERSION (CATH LAB)
Anesthesia: General

## 2024-03-29 MED ORDER — SODIUM CHLORIDE 0.9% FLUSH
INTRAVENOUS | Status: DC | PRN
  Administered 2024-03-29 (×2): 4 mL via INTRAVENOUS

## 2024-03-29 MED ORDER — PROPOFOL 10 MG/ML IV BOLUS
INTRAVENOUS | Status: DC | PRN
  Administered 2024-03-29: 10 mg via INTRAVENOUS
  Administered 2024-03-29: 60 mg via INTRAVENOUS

## 2024-03-29 MED ORDER — SODIUM CHLORIDE 0.9% FLUSH: 3.0000 mL | INTRAVENOUS | Status: DC | PRN

## 2024-03-29 MED ORDER — SODIUM CHLORIDE 0.9% FLUSH: 3.0000 mL | Freq: Two times a day (BID) | INTRAVENOUS | Status: DC

## 2024-03-29 MED ORDER — LIDOCAINE 2% (20 MG/ML) 5 ML SYRINGE
INTRAMUSCULAR | Status: DC | PRN
  Administered 2024-03-29: 20 mg via INTRAVENOUS

## 2024-03-29 SURGICAL SUPPLY — 1 items: PAD DEFIB RADIO PHYSIO CONN (PAD) ×2 IMPLANT

## 2024-03-29 NOTE — Discharge Instructions (Signed)

## 2024-03-29 NOTE — Interval H&P Note (Signed)
 History and Physical Interval Note:  03/29/2024 8:50 AM  Briana [name]  has presented today for surgery, with the diagnosis of AFIB.  The various methods of treatment have been discussed with the patient and family. After consideration of risks, benefits and other options for treatment, the patient has consented to  Procedure(s): CARDIOVERSION (N/A) as a surgical intervention.  The patient's history has been reviewed, patient examined, no change in status, stable for surgery.  I have reviewed the patient's chart and labs.  Questions were answered to the patient's satisfaction.     Winferd Wease Veryl Gottron

## 2024-03-29 NOTE — Anesthesia Postprocedure Evaluation (Signed)
 Anesthesia Post Note  Patient: Briana [name]  Procedure(s) Performed: CARDIOVERSION     Patient location during evaluation: Cath Lab Anesthesia Type: General Level of consciousness: awake and alert, oriented and patient cooperative Pain management: pain level controlled Vital Signs Assessment: post-procedure vital signs reviewed and stable Respiratory status: spontaneous breathing, nonlabored ventilation and respiratory function stable Cardiovascular status: blood pressure returned to baseline and stable Postop Assessment: no apparent nausea or vomiting and able to ambulate Anesthetic complications: no   No notable events documented.  Last Vitals:  Vitals:   03/29/24 0937 03/29/24 0947  BP: 126/70 (!) 145/79  Pulse: (!) 56 60  Resp: 17 18  Temp:    SpO2: 93% 95%    Last Pain:  Vitals:   03/29/24 0937  TempSrc:   PainSc: 0-No pain                 Dalton Molesworth,E. Andrae Claunch

## 2024-03-29 NOTE — Transfer of Care (Signed)
 Immediate Anesthesia Transfer of Care Note  Patient: Briana [name]  Procedure(s) Performed: CARDIOVERSION  Patient Location: PACU and Cath Lab  Anesthesia Type:MAC  Level of Consciousness: drowsy  Airway & Oxygen Therapy: Patient Spontanous Breathing and Patient connected to nasal cannula oxygen  Post-op Assessment: Report given to RN and Post -op Vital signs reviewed and stable  Post vital signs: Reviewed and stable  Last Vitals:  Vitals Value Taken Time  BP 133/64 03/29/24 0930  Temp 36.2 C 03/29/24 0927  Pulse 55 03/29/24 0930  Resp 18 03/29/24 0930  SpO2 97 % 03/29/24 0930    Last Pain:  Vitals:   03/29/24 0927  TempSrc: Tympanic  PainSc: Asleep         Complications: No notable events documented.

## 2024-03-29 NOTE — CV Procedure (Signed)
 Procedure:   DCCV  Indication:  Symptomatic atrial flutter  Procedure Note:  The patient signed informed consent.  They have had had therapeutic anticoagulation with rivaroxaban  greater than 3 weeks.  Anesthesia was administered by Dr. Cleora Daft.  Patient received 20 mg IV lidocaine  and 70 mg IV propofol .Adequate airway was maintained throughout and vital followed per protocol.  They were cardioverted x 1 with 200J of biphasic synchronized energy.  They converted to sinus bradycardia.  There were no apparent complications.  The patient had normal neuro status and respiratory status post procedure with vitals stable as recorded elsewhere.    Follow up:  They will continue on current medical therapy and follow up with cardiology as scheduled.  Sheryle Donning, MD PhD 03/29/2024 9:24 AM

## 2024-03-29 NOTE — Anesthesia Preprocedure Evaluation (Signed)
 Anesthesia Evaluation  Patient identified by MRN, date of birth, ID band Patient awake    Reviewed: Allergy & Precautions, NPO status , Patient's Chart, lab work & pertinent test results  History of Anesthesia Complications Negative for: history of anesthetic complications  Airway Mallampati: I  TM Distance: >3 FB Neck ROM: Full    Dental  (+) Caps, Dental Advisory Given   Pulmonary COPD,  COPD inhaler   breath sounds clear to auscultation       Cardiovascular hypertension, Pt. on medications +CHF  + dysrhythmias Atrial Fibrillation  Rhythm:Irregular Rate:Normal  04/07/2024 ECHO:  1. Atrial flutter controlled rate     EF 60 to 65%. The LV has normal function, no regional wall motion abnormalities. There is mild concentric LVH.  2. RVF is mildly reduced. The right ventricular size is normal. There is normal pulmonary artery systolic pressure.   3. Left atrial size was severely dilated.   4. The mitral valve is normal in structure. Mild mitral valve regurgitation. No evidence of mitral stenosis.   5. The aortic valve is tricuspid. There is mild thickening of the aortic valve. Aortic valve regurgitation is mild. No aortic stenosis is present.     Neuro/Psych negative neurological ROS     GI/Hepatic hiatal hernia,GERD  ,,  Endo/Other  diabetes (diet controlled)    Renal/GU Renal disease     Musculoskeletal   Abdominal   Peds  Hematology xarelto    Anesthesia Other Findings   Reproductive/Obstetrics                              Anesthesia Physical Anesthesia Plan  ASA: 3  Anesthesia Plan: General   Post-op Pain Management: Minimal or no pain anticipated   Induction: Intravenous  PONV Risk Score and Plan: 3 and Treatment may vary due to age or medical condition  Airway Management Planned: Nasal Cannula and Natural Airway  Additional Equipment: None  Intra-op Plan:    Post-operative Plan:   Informed Consent: I have reviewed the patients History and Physical, chart, labs and discussed the procedure including the risks, benefits and alternatives for the proposed anesthesia with the patient or authorized representative who has indicated his/her understanding and acceptance.     Dental advisory given  Plan Discussed with: CRNA and Surgeon  Anesthesia Plan Comments:          Anesthesia Quick Evaluation

## 2024-03-30 ENCOUNTER — Ambulatory Visit (HOSPITAL_COMMUNITY): Payer: Self-pay | Admitting: Physician Assistant

## 2024-03-30 LAB — CBC
Hematocrit: 42.4 % (ref 34.0–46.6)
Hemoglobin: 13.4 g/dL (ref 11.1–15.9)
MCH: 28.7 pg (ref 26.6–33.0)
MCHC: 31.6 g/dL (ref 31.5–35.7)
MCV: 91 fL (ref 79–97)
Platelets: 242 10*3/uL (ref 150–450)
RBC: 4.67 x10E6/uL (ref 3.77–5.28)
RDW: 16.6 % — ABNORMAL HIGH (ref 11.7–15.4)
WBC: 8 10*3/uL (ref 3.4–10.8)

## 2024-03-30 LAB — BASIC METABOLIC PANEL WITH GFR
BUN/Creatinine Ratio: 23 (ref 12–28)
BUN: 34 mg/dL — ABNORMAL HIGH (ref 8–27)
CO2: 17 mmol/L — ABNORMAL LOW (ref 20–29)
Calcium: 9.8 mg/dL (ref 8.7–10.3)
Chloride: 103 mmol/L (ref 96–106)
Creatinine, Ser: 1.46 mg/dL — ABNORMAL HIGH (ref 0.57–1.00)
Glucose: 163 mg/dL — ABNORMAL HIGH (ref 70–99)
Potassium: 4.5 mmol/L (ref 3.5–5.2)
Sodium: 139 mmol/L (ref 134–144)
eGFR: 34 mL/min/{1.73_m2} — ABNORMAL LOW (ref >59)

## 2024-03-30 LAB — TSH: TSH: 1.26 u[IU]/mL (ref 0.450–4.500)

## 2024-03-31 ENCOUNTER — Ambulatory Visit

## 2024-04-21 ENCOUNTER — Ambulatory Visit (HOSPITAL_COMMUNITY): Admitting: Physician Assistant

## 2024-04-24 ENCOUNTER — Encounter (HOSPITAL_COMMUNITY): Payer: Self-pay

## 2024-04-24 ENCOUNTER — Other Ambulatory Visit: Payer: Self-pay

## 2024-04-24 ENCOUNTER — Observation Stay (HOSPITAL_COMMUNITY)
Admission: EM | Admit: 2024-04-24 | Discharge: 2024-04-26 | Disposition: A | Discharge status: Home / Self Care | Attending: Family Medicine | Admitting: Family Medicine

## 2024-04-24 DIAGNOSIS — I5033 Acute on chronic diastolic (congestive) heart failure: Secondary | ICD-10-CM | POA: Diagnosis not present

## 2024-04-24 DIAGNOSIS — I480 Paroxysmal atrial fibrillation: Secondary | ICD-10-CM | POA: Insufficient documentation

## 2024-04-24 DIAGNOSIS — K922 Gastrointestinal hemorrhage, unspecified: Secondary | ICD-10-CM | POA: Diagnosis not present

## 2024-04-24 DIAGNOSIS — I482 Chronic atrial fibrillation, unspecified: Secondary | ICD-10-CM

## 2024-04-24 DIAGNOSIS — Z79899 Other long term (current) drug therapy: Secondary | ICD-10-CM | POA: Insufficient documentation

## 2024-04-24 DIAGNOSIS — I5032 Chronic diastolic (congestive) heart failure: Secondary | ICD-10-CM | POA: Insufficient documentation

## 2024-04-24 DIAGNOSIS — N1832 Chronic kidney disease, stage 3b: Secondary | ICD-10-CM

## 2024-04-24 DIAGNOSIS — I4892 Unspecified atrial flutter: Secondary | ICD-10-CM

## 2024-04-24 DIAGNOSIS — I130 Hypertensive heart and chronic kidney disease with heart failure and stage 1 through stage 4 chronic kidney disease, or unspecified chronic kidney disease: Secondary | ICD-10-CM | POA: Diagnosis not present

## 2024-04-24 DIAGNOSIS — K625 Hemorrhage of anus and rectum: Secondary | ICD-10-CM | POA: Diagnosis present

## 2024-04-24 DIAGNOSIS — K5730 Diverticulosis of large intestine without perforation or abscess without bleeding: Secondary | ICD-10-CM

## 2024-04-24 DIAGNOSIS — Z7901 Long term (current) use of anticoagulants: Secondary | ICD-10-CM

## 2024-04-24 DIAGNOSIS — Z85828 Personal history of other malignant neoplasm of skin: Secondary | ICD-10-CM | POA: Insufficient documentation

## 2024-04-24 DIAGNOSIS — R251 Tremor, unspecified: Secondary | ICD-10-CM | POA: Insufficient documentation

## 2024-04-24 LAB — CBC WITH DIFFERENTIAL/PLATELET
Abs Immature Granulocytes: 0.04 10*3/uL (ref 0.00–0.07)
Basophils Absolute: 0 10*3/uL (ref 0.0–0.1)
Basophils Relative: 1 %
Eosinophils Absolute: 0.1 10*3/uL (ref 0.0–0.5)
Eosinophils Relative: 1 %
HCT: 42.5 % (ref 36.0–46.0)
Hemoglobin: 13 g/dL (ref 12.0–15.0)
Immature Granulocytes: 1 %
Lymphocytes Relative: 14 %
Lymphs Abs: 1 10*3/uL (ref 0.7–4.0)
MCH: 28.5 pg (ref 26.0–34.0)
MCHC: 30.6 g/dL (ref 30.0–36.0)
MCV: 93.2 fL (ref 80.0–100.0)
Monocytes Absolute: 0.6 10*3/uL (ref 0.1–1.0)
Monocytes Relative: 9 %
Neutro Abs: 5 10*3/uL (ref 1.7–7.7)
Neutrophils Relative %: 74 %
Platelets: 216 10*3/uL (ref 150–400)
RBC: 4.56 MIL/uL (ref 3.87–5.11)
RDW: 18.1 % — ABNORMAL HIGH (ref 11.5–15.5)
WBC: 6.7 10*3/uL (ref 4.0–10.5)
nRBC: 0 % (ref 0.0–0.2)

## 2024-04-24 LAB — URINALYSIS, W/ REFLEX TO CULTURE (INFECTION SUSPECTED)
Bilirubin Urine: NEGATIVE
Glucose, UA: NEGATIVE mg/dL
Ketones, ur: NEGATIVE mg/dL
Nitrite: POSITIVE — AB
Protein, ur: 30 mg/dL — AB
Specific Gravity, Urine: 1.012 (ref 1.005–1.030)
WBC, UA: >50 WBC/hpf (ref 0–5)
pH: 6 (ref 5.0–8.0)

## 2024-04-24 LAB — COMPREHENSIVE METABOLIC PANEL WITH GFR
ALT: 19 U/L (ref 0–44)
AST: 22 U/L (ref 15–41)
Albumin: 3.9 g/dL (ref 3.5–5.0)
Alkaline Phosphatase: 80 U/L (ref 38–126)
Anion gap: 8 (ref 5–15)
BUN: 30 mg/dL — ABNORMAL HIGH (ref 8–23)
CO2: 26 mmol/L (ref 22–32)
Calcium: 9.7 mg/dL (ref 8.9–10.3)
Chloride: 105 mmol/L (ref 98–111)
Creatinine, Ser: 1.35 mg/dL — ABNORMAL HIGH (ref 0.44–1.00)
GFR, Estimated: 38 mL/min — ABNORMAL LOW (ref >60)
Glucose, Bld: 187 mg/dL — ABNORMAL HIGH (ref 70–99)
Potassium: 4.5 mmol/L (ref 3.5–5.1)
Sodium: 139 mmol/L (ref 135–145)
Total Bilirubin: 0.7 mg/dL (ref 0.0–1.2)
Total Protein: 6.6 g/dL (ref 6.5–8.1)

## 2024-04-24 LAB — CBC
HCT: 37.6 % (ref 36.0–46.0)
Hemoglobin: 11.8 g/dL — ABNORMAL LOW (ref 12.0–15.0)
MCH: 28.8 pg (ref 26.0–34.0)
MCHC: 31.4 g/dL (ref 30.0–36.0)
MCV: 91.7 fL (ref 80.0–100.0)
Platelets: 210 10*3/uL (ref 150–400)
RBC: 4.1 MIL/uL (ref 3.87–5.11)
RDW: 18 % — ABNORMAL HIGH (ref 11.5–15.5)
WBC: 6.3 10*3/uL (ref 4.0–10.5)
nRBC: 0 % (ref 0.0–0.2)

## 2024-04-24 LAB — LIPASE, BLOOD: Lipase: 27 U/L (ref 11–51)

## 2024-04-24 LAB — POC OCCULT BLOOD, ED: Fecal Occult Bld: POSITIVE — AB

## 2024-04-24 LAB — TSH: TSH: 1.462 u[IU]/mL (ref 0.350–4.500)

## 2024-04-24 LAB — TYPE AND SCREEN
ABO/RH(D): O POS
Antibody Screen: POSITIVE

## 2024-04-24 MED ORDER — DIAZEPAM 2 MG PO TABS
2.0000 mg | ORAL_TABLET | Freq: Three times a day (TID) | ORAL | Status: DC | PRN
  Administered 2024-04-26: 2 mg via ORAL
  Filled 2024-04-24: qty 1

## 2024-04-24 MED ORDER — HYDRALAZINE HCL 20 MG/ML IJ SOLN
5.0000 mg | Freq: Once | INTRAMUSCULAR | Status: AC
  Administered 2024-04-24: 5 mg via INTRAVENOUS
  Filled 2024-04-24: qty 1

## 2024-04-24 MED ORDER — HYDRALAZINE HCL 10 MG PO TABS
10.0000 mg | ORAL_TABLET | Freq: Three times a day (TID) | ORAL | Status: DC
  Administered 2024-04-24 – 2024-04-25 (×2): 10 mg via ORAL
  Filled 2024-04-24 (×2): qty 1

## 2024-04-24 MED ORDER — PANTOPRAZOLE SODIUM 40 MG IV SOLR
40.0000 mg | Freq: Once | INTRAVENOUS | Status: AC
  Administered 2024-04-24: 40 mg via INTRAVENOUS
  Filled 2024-04-24: qty 10

## 2024-04-24 MED ORDER — ACETAMINOPHEN 325 MG PO TABS
650.0000 mg | ORAL_TABLET | Freq: Four times a day (QID) | ORAL | Status: DC | PRN
  Administered 2024-04-24 – 2024-04-25 (×5): 650 mg via ORAL
  Filled 2024-04-24 (×5): qty 2

## 2024-04-24 MED ORDER — LACTATED RINGERS IV BOLUS
1000.0000 mL | Freq: Once | INTRAVENOUS | Status: AC
  Administered 2024-04-24: 1000 mL via INTRAVENOUS

## 2024-04-24 MED ORDER — HYDRALAZINE HCL 20 MG/ML IJ SOLN: 10.0000 mg | Freq: Once | INTRAMUSCULAR | Status: DC

## 2024-04-24 MED ORDER — AMIODARONE HCL 200 MG PO TABS
200.0000 mg | ORAL_TABLET | Freq: Two times a day (BID) | ORAL | Status: DC
  Administered 2024-04-24 – 2024-04-26 (×5): 200 mg via ORAL
  Filled 2024-04-24 (×5): qty 1

## 2024-04-24 NOTE — ED Provider Notes (Signed)
 [place] EMERGENCY DEPARTMENT AT [facility] Provider Note   CSN: [id number] Arrival date & time: 04/24/24  4098     History  Chief Complaint  Patient presents with   Rectal Bleeding    Briana [name] is a 89 y.o. female.  Patient w hx diverticula of colon and gi bleeding, c/o dark maroon stool this AM, feels generally weak/faint when stands. Is on xarelto . No other abnormal bruising or bleeding. No abd pain or distension. No vomiting. No fever or chills.   The history is provided by the patient and medical records.  Rectal Bleeding Associated symptoms: light-headedness   Associated symptoms: no abdominal pain, no epistaxis, no fever and no vomiting        Home Medications Prior to Admission medications   Medication Sig Start Date End Date Taking? Authorizing Provider  acetaminophen  (TYLENOL ) 500 MG tablet Take 500 mg by mouth every 6 (six) hours as needed for moderate pain.    [provider]  albuterol  (PROAIR  HFA) 108 (90 Base) MCG/ACT inhaler Take  2 inhalations  15 minutes apart  every 4 hours  as needed for Wheezing 03/09/22   Vangie Genet, MD  amiodarone  (PACERONE ) 200 MG tablet Take 1 tablet (200 mg total) by mouth daily. Patient taking differently: Take 200 mg by mouth 2 (two) times daily. 03/28/24   Fenton, Clint R, PA  Ascorbic Acid  (VITAMIN C  PO) Take 1 tablet by mouth at bedtime.    [provider]  Boswellia-Glucosamine-Vit D (OSTEO BI-FLEX ONE PER DAY PO) Take 1 tablet by mouth in the morning.    [provider]  Carboxymeth-Glycerin -Polysorb (REFRESH DIGITAL OP) Place 1 drop into the right eye daily as needed (dry eyes).    [provider]  cholecalciferol  (VITAMIN D3) 25 MCG (1000 UNIT) tablet Take 3,000 Units by mouth daily.    [provider]  Cyanocobalamin  (B-12 PO) Take 1 capsule by mouth daily.    [provider]  diazepam  (VALIUM ) 2 MG tablet Take 1 tablet 3 - 4 x /day for Tremor                                      /                                 TAKE                    BY                     MOUTH Patient taking differently: Take 2 mg by mouth as needed (Tremors). 06/20/22   Vangie Genet, MD  diltiazem  (CARDIZEM  CD) 180 MG 24 hr capsule Take 1 capsule  Daily for Heart & BP                                                                                                   /  TAKE                                      BY                                            MOUTH                                                   ONCE ? DAILY 02/25/24   [name], FNP  Ferrous Sulfate (SLOW RELEASE IRON PO) Take 1 tablet by mouth at bedtime.    [provider]  furosemide  (LASIX ) 40 MG tablet Take 1 tablet (40 mg total) by mouth daily. 06/14/23 06/13/24  Wilkinson, Dana E, FNP  loratadine (CLARITIN) 10 MG tablet Take 10 mg by mouth daily as needed for allergies.    [provider]  Multiple Vitamins-Minerals (ZINC PO) Take 1 capsule by mouth daily.    [provider]  polyethylene glycol (MIRALAX  / GLYCOLAX ) 17 g packet Take 17 g by mouth daily. Patient taking differently: Take 17 g by mouth daily as needed for mild constipation. 03/11/21   Akula, Vijaya, MD  Pyridoxine HCl (B-6 PO) Take 1 tablet by mouth daily.    [provider]  Rivaroxaban  (XARELTO ) 15 MG TABS tablet TAKE 1 TABLET BY MOUTH ONCE DAILY WITH  SUPPER  STOP  ELIQUIS  12/13/23   Fenton, Clint R, PA  Simethicone (GAS-X PO) Take 1 tablet by mouth daily as needed (gas).    [provider]      Allergies    Codeine, Accupril [quinapril hcl], Prednisone , Reglan [metoclopramide], Tramadol , Adhesive [tape], and Neosporin [neomycin-bacitracin zn-polymyx]    Review of Systems   Review of Systems  Constitutional:  Negative for chills and fever.  HENT:  Negative for nosebleeds.   Respiratory:  Negative for shortness of breath.   Cardiovascular:  Negative for chest  pain.  Gastrointestinal:  Positive for blood in stool and hematochezia. Negative for abdominal pain and vomiting.  Genitourinary:  Negative for flank pain and hematuria.  Musculoskeletal:  Negative for back pain and neck pain.  Neurological:  Positive for light-headedness. Negative for headaches.    Physical Exam Updated Vital Signs BP (!) 143/69 (BP Location: Right Arm)   Pulse 61   Temp 97.8 F (36.6 C)   Resp 17   Ht 1.524 m (5')   Wt 58.5 kg   SpO2 98%   BMI 25.19 kg/m  Physical Exam Vitals and nursing note reviewed.  Constitutional:      Appearance: Normal appearance. She is well-developed.  HENT:     Head: Atraumatic.     Nose: Nose normal.     Mouth/Throat:     Mouth: Mucous membranes are moist.  Eyes:     General: No scleral icterus.    Conjunctiva/sclera: Conjunctivae normal.  Neck:     Trachea: No tracheal deviation.  Cardiovascular:     Rate and Rhythm: Normal rate and regular rhythm.     Pulses: Normal pulses.     Heart sounds: Normal heart sounds. No murmur heard.    No friction rub. No gallop.  Pulmonary:     Effort: Pulmonary effort is normal. No respiratory distress.     Breath sounds: Normal breath sounds.  Abdominal:     General: Bowel sounds are normal. There is no distension.     Palpations: Abdomen is soft. There is no mass.     Tenderness: There is no abdominal tenderness. There is no guarding.  Genitourinary:    Comments: No cva tenderness. Thick dark maroon stool, heme pos.  Musculoskeletal:        General: No swelling.     Cervical back: Normal range of motion and neck supple. No rigidity. No muscular tenderness.  Skin:    General: Skin is warm and dry.     Findings: No rash.  Neurological:     Mental Status: She is alert.     Comments: Alert, speech normal.   Psychiatric:        Mood and Affect: Mood normal.     ED Results / Procedures / Treatments   Labs (all labs ordered are listed, but only abnormal results are  displayed) Results for orders placed or performed during the hospital encounter of 04/24/24  CBC with Differential   Collection Time: 04/24/24 10:07 AM  Result Value Ref Range   WBC 6.7 4.0 - 10.5 K/uL   RBC 4.56 3.87 - 5.11 MIL/uL   Hemoglobin 13.0 12.0 - 15.0 g/dL   HCT 16.1 09.6 - 04.5 %   MCV 93.2 80.0 - 100.0 fL   MCH 28.5 26.0 - 34.0 pg   MCHC 30.6 30.0 - 36.0 g/dL   RDW 40.9 (H) 81.1 - 91.4 %   Platelets 216 150 - 400 K/uL   nRBC 0.0 0.0 - 0.2 %   Neutrophils Relative % 74 %   Neutro Abs 5.0 1.7 - 7.7 K/uL   Lymphocytes Relative 14 %   Lymphs Abs 1.0 0.7 - 4.0 K/uL   Monocytes Relative 9 %   Monocytes Absolute 0.6 0.1 - 1.0 K/uL   Eosinophils Relative 1 %   Eosinophils Absolute 0.1 0.0 - 0.5 K/uL   Basophils Relative 1 %   Basophils Absolute 0.0 0.0 - 0.1 K/uL   Immature Granulocytes 1 %   Abs Immature Granulocytes 0.04 0.00 - 0.07 K/uL  Comprehensive metabolic panel   Collection Time: 04/24/24 10:07 AM  Result Value Ref Range   Sodium 139 135 - 145 mmol/L   Potassium 4.5 3.5 - 5.1 mmol/L   Chloride 105 98 - 111 mmol/L   CO2 26 22 - 32 mmol/L   Glucose, Bld 187 (H) 70 - 99 mg/dL   BUN 30 (H) 8 - 23 mg/dL   Creatinine, Ser 7.82 (H) 0.44 - 1.00 mg/dL   Calcium 9.7 8.9 - 95.6 mg/dL   Total Protein 6.6 6.5 - 8.1 g/dL   Albumin  3.9 3.5 - 5.0 g/dL   AST 22 15 - 41 U/L   ALT 19 0 - 44 U/L   Alkaline Phosphatase 80 38 - 126 U/L   Total Bilirubin 0.7 0.0 - 1.2 mg/dL   GFR, Estimated 38 (L) >60 mL/min   Anion gap 8 5 - 15  Lipase, blood   Collection Time: 04/24/24 10:07 AM  Result Value Ref Range   Lipase 27 11 - 51 U/L  Type and screen [facility]   Collection Time: 04/24/24 10:07 AM  Result Value Ref Range   ABO/RH(D) PENDING    Antibody Screen PENDING    Sample Expiration  04/27/2024,2359 Performed at [facility], [address] [place], [place] [address]   POC occult blood, ED Provider will collect   Collection Time: 04/24/24  10:27 AM  Result Value Ref Range   Fecal Occult Bld POSITIVE (A) NEGATIVE   EP STUDY Result Date: 03/29/2024 See surgical note for result.    EKG EKG Interpretation Date/Time:  Monday April 24 2024 09:43:31 EDT Ventricular Rate:  54 PR Interval:  236 QRS Duration:  108 QT Interval:  470 QTC Calculation: 445 R Axis:   260  Text Interpretation: Sinus bradycardia with 1st degree A-V block Right superior axis deviation Incomplete right bundle branch block  No significant change since last tracing  Confirmed by Guadalupe Lee ([id number]) on 04/24/2024 9:55:32 AM  Radiology No results found.  Procedures Procedures    Medications Ordered in ED Medications  pantoprazole  (PROTONIX ) injection 40 mg (has no administration in time range)  lactated ringers  bolus 1,000 mL (has no administration in time range)    ED Course/ Medical Decision Making/ A&P                                 Medical Decision Making Problems Addressed: Acute GI bleeding: acute illness or injury with systemic symptoms that poses a threat to life or bodily functions Diverticula of colon: chronic illness or injury  Amount and/or Complexity of Data Reviewed External Data Reviewed: notes. Labs: ordered. Decision-making details documented in ED Course. Discussion of management or test interpretation with external provider(s): medicine  Risk Prescription drug management. Decision regarding hospitalization.   Iv ns. Continuous pulse ox and cardiac monitoring. Labs ordered/sent.   Differential diagnosis includes gi bleeding, etc. Dispo decision including potential need for admission considered - will get labs and reassess.   Reviewed nursing notes and prior charts for additional history. External reports reviewed.  Prior admission with gi bleeding secondary to diverticula noted.   Iv ns. Protonix  iv.   Cardiac monitor: sinus rhythm, rate 66.  Labs reviewed/interpreted by me - hgb 13, mildly decreased from  prior.   Medicine consulted for admission.            Final Clinical Impression(s) / ED Diagnoses Final diagnoses:  Acute GI bleeding  Diverticula of colon    Rx / DC Orders ED Discharge Orders     None         Guadalupe Lee, MD 04/24/24 1104

## 2024-04-24 NOTE — ED Triage Notes (Signed)
 Pt to ED via pov from home. Pt c/o left upper abdominal pain for "awhile". Pt started having rectal bleeding this morning. Pt states she has had bright red blood today and feels weak. Pt denies nausea or vomiting.

## 2024-04-24 NOTE — Plan of Care (Signed)

## 2024-04-24 NOTE — Care Management Obs Status (Signed)
 MEDICARE OBSERVATION STATUS NOTIFICATION   Patient Details  Name: NELIDA MANDARINO MRN: [id number] Date of Birth: 02/12/1934   Medicare Observation Status Notification Given:  Yes   Lettersigned and copy given Wynonia Hedges 04/24/2024, 3:42 PM

## 2024-04-24 NOTE — ED Notes (Signed)
 Wheeled patient to the bathroom patient is now out with family at bedside

## 2024-04-24 NOTE — H&P (Signed)
 History and Physical    NIAH HEINLE ZOX:[id number] DOB: 05-22-1934 DOA: 04/24/2024  I have briefly reviewed the patient's prior medical records in [facility]  PCP: [name], FNP  Patient coming from: home  Chief Complaint: blood in her stools  HPI: Briana [name] is a 89 y.o. female with medical history significant of a flutter with recent cardioversion in May 2025, on chronic anticoagulation with Xarelto , diastolic CHF, HTN, HLD, prior history of GI bleed from colonic diverticulosis, coming to the hospital with bright red blood per rectum.  Her symptoms started yesterday, have continued, stools are becoming more maroon and she decided to come to the hospital.  She complains of abdominal discomfort but states that that is chronic due to "lots of gas", no nausea or vomiting.  No chest pain.  She complains of lightheadedness especially since her GI bleed has started.  Denies any fever or chills, no diarrhea/sick contacts, no URI symptoms.  ED Course: In the emergency room she is afebrile, blood pressure on the high side in the 140s-150s, satting well on room air.  Blood work reveals a creatinine of 1.3, hemoglobin 13.0.  Rectal exam by EDP showed maroon stools with positive fecal occult.  We are asked to admit for GI bleed  Review of Systems: All systems reviewed, and apart from HPI, all negative  Past Medical History:  Diagnosis Date   Acute on chronic diastolic (congestive) heart failure (HCC) 11/21/2018   Cancer (HCC)    skin cancer   DDD (degenerative disc disease)    Diverticula of colon    Diverticulitis    DJD (degenerative joint disease)    GERD (gastroesophageal reflux disease)    takes ranitidine   Heart murmur    History of hiatal hernia    History of pneumonia    Hx of irritable bowel syndrome    Hyperlipidemia    Hypertension    Occasional tremors    Osteoporosis    Pre-diabetes    Varicose veins    Vitamin D  deficiency     Past Surgical History:   Procedure Laterality Date   APPENDECTOMY     BREAST EXCISIONAL BIOPSY Right    BREAST SURGERY Right    fluid drained from breast   CARDIOVERSION N/A 12/20/2018   Procedure: CARDIOVERSION;  Surgeon: Sheryle Donning, MD;  Location: [facility] ENDOSCOPY;  Service: Cardiovascular;  Laterality: N/A;   CARDIOVERSION N/A 06/16/2019   Procedure: CARDIOVERSION;  Surgeon: Elmyra Haggard, MD;  Location: [facility] ENDOSCOPY;  Service: Cardiovascular;  Laterality: N/A;   CARDIOVERSION N/A 05/17/2023   Procedure: CARDIOVERSION;  Surgeon: Elmyra Haggard, MD;  Location: [facility] INVASIVE CV LAB;  Service: Cardiovascular;  Laterality: N/A;   CARDIOVERSION N/A 03/29/2024   Procedure: CARDIOVERSION;  Surgeon: Sheryle Donning, MD;  Location: [facility] INVASIVE CV LAB;  Service: Cardiovascular;  Laterality: N/A;   COLONOSCOPY     ESOPHAGOGASTRODUODENOSCOPY     FIDUCIAL MARKER PLACEMENT  11/20/2023   Procedure: CLIP MARKER PLACEMENT;  Surgeon: Alvis Jourdain, MD;  Location: [facility] ENDOSCOPY;  Service: Gastroenterology;;   Milta Alosa SIGMOIDOSCOPY N/A 03/09/2021   Procedure: FLEXIBLE SIGMOIDOSCOPY;  Surgeon: Sergio Dandy, MD;  Location: [facility] ENDOSCOPY;  Service: Endoscopy;  Laterality: N/A;   FLEXIBLE SIGMOIDOSCOPY N/A 11/20/2023   Procedure: FLEXIBLE SIGMOIDOSCOPY;  Surgeon: Alvis Jourdain, MD;  Location: [facility] ENDOSCOPY;  Service: Gastroenterology;  Laterality: N/A;   HEMOSTASIS CLIP PLACEMENT  03/09/2021   Procedure: HEMOSTASIS CLIP PLACEMENT;  Surgeon: Nandigam, Kavitha V, MD;  Location: [facility]  ENDOSCOPY;  Service: Endoscopy;;   JOINT REPLACEMENT Left    left hip   LUMBAR LAMINECTOMY/DECOMPRESSION MICRODISCECTOMY N/A 06/05/2015   Procedure: L3-L5 DECOMPRESSION ;  Surgeon: Mort Ards, MD;  Location: [facility] OR;  Service: Orthopedics;  Laterality: N/A;   OPEN REDUCTION INTERNAL FIXATION (ORIF) DISTAL RADIAL FRACTURE Right 01/10/2014   Procedure: OPEN REDUCTION INTERNAL FIXATION (ORIF) DISTAL RADIAL FRACTURE;  Surgeon: Shellie Dials, MD;  Location:  [facility] OR;  Service: Orthopedics;  Laterality: Right;   SPINAL CORD DECOMPRESSION  06/05/2015   L3 L 5   THYROID  SURGERY     TONSILLECTOMY     TUBAL LIGATION     varicose veins stripped     WRIST FRACTURE SURGERY Bilateral      reports that she has never smoked. She has never used smokeless tobacco. She reports that she does not drink alcohol  and does not use drugs.  Allergies  Allergen Reactions   Codeine Rash   Accupril [Quinapril Hcl] Cough   Prednisone      Agitated and shaky   Reglan [Metoclopramide] Other (See Comments)    tremors   Tramadol  Nausea And Vomiting   Adhesive [Tape] Rash   Neosporin [Neomycin-Bacitracin Zn-Polymyx] Itching and Rash    Family History  Problem Relation Age of Onset   Hypertension Mother    Hyperlipidemia Mother    Heart disease Sister    Cancer Brother        prostate   Colon cancer Neg Hx    Esophageal cancer Neg Hx    Pancreatic cancer Neg Hx    Stomach cancer Neg Hx    Liver disease Neg Hx    Breast cancer Neg Hx     Prior to Admission medications   Medication Sig Start Date End Date Taking? Authorizing Provider  acetaminophen  (TYLENOL ) 500 MG tablet Take 500 mg by mouth every 6 (six) hours as needed for moderate pain.    [provider]  albuterol  (PROAIR  HFA) 108 (90 Base) MCG/ACT inhaler Take  2 inhalations  15 minutes apart  every 4 hours  as needed for Wheezing 03/09/22   Vangie Genet, MD  amiodarone  (PACERONE ) 200 MG tablet Take 1 tablet (200 mg total) by mouth daily. Patient taking differently: Take 200 mg by mouth 2 (two) times daily. 03/28/24   Fenton, Clint R, PA  Ascorbic Acid  (VITAMIN C  PO) Take 1 tablet by mouth at bedtime.    [provider]  Boswellia-Glucosamine-Vit D (OSTEO BI-FLEX ONE PER DAY PO) Take 1 tablet by mouth in the morning.    [provider]  Carboxymeth-Glycerin -Polysorb (REFRESH DIGITAL OP) Place 1 drop into the right eye daily as needed (dry eyes).    [provider]   cholecalciferol  (VITAMIN D3) 25 MCG (1000 UNIT) tablet Take 3,000 Units by mouth daily.    [provider]  Cyanocobalamin  (B-12 PO) Take 1 capsule by mouth daily.    [provider]  diazepam  (VALIUM ) 2 MG tablet Take 1 tablet 3 - 4 x /day for Tremor                                     /                                 TAKE  BY                     MOUTH Patient taking differently: Take 2 mg by mouth as needed (Tremors). 06/20/22   Vangie Genet, MD  diltiazem  (CARDIZEM  CD) 180 MG 24 hr capsule Take 1 capsule  Daily for Heart & BP                                                                                                   /                          TAKE                                      BY                                            MOUTH                                                   ONCE ? DAILY 02/25/24   [name], FNP  Ferrous Sulfate (SLOW RELEASE IRON PO) Take 1 tablet by mouth at bedtime.    [provider]  furosemide  (LASIX ) 40 MG tablet Take 1 tablet (40 mg total) by mouth daily. 06/14/23 06/13/24  Wilkinson, Dana E, FNP  loratadine (CLARITIN) 10 MG tablet Take 10 mg by mouth daily as needed for allergies.    [provider]  Multiple Vitamins-Minerals (ZINC PO) Take 1 capsule by mouth daily.    [provider]  polyethylene glycol (MIRALAX  / GLYCOLAX ) 17 g packet Take 17 g by mouth daily. Patient taking differently: Take 17 g by mouth daily as needed for mild constipation. 03/11/21   Akula, Vijaya, MD  Pyridoxine HCl (B-6 PO) Take 1 tablet by mouth daily.    [provider]  Rivaroxaban  (XARELTO ) 15 MG TABS tablet TAKE 1 TABLET BY MOUTH ONCE DAILY WITH  SUPPER  STOP  ELIQUIS  12/13/23   Fenton, Clint R, PA  Simethicone (GAS-X PO) Take 1 tablet by mouth daily as needed (gas).    [provider]    Physical Exam: Vitals:   04/24/24 0841 04/24/24 0845  BP: (!) 143/69   Pulse: 61   Resp:  17   Temp: 97.8 F (36.6 C)   SpO2: 98%   Weight:  58.5 kg  Height:  5' (1.524 m)   Constitutional: NAD, calm, comfortable Eyes: PERRL, lids and conjunctivae normal ENMT: Mucous membranes are moist. Neck: normal, supple Respiratory: clear to auscultation bilaterally, no wheezing, no crackles. Normal respiratory effort. No accessory muscle use.  Cardiovascular: Regular rate and rhythm, no murmurs / rubs / gallops.  No extremity edema.  Abdomen: no tenderness, no masses palpated. Bowel sounds positive.  Musculoskeletal: no clubbing / cyanosis. Normal muscle tone.  Skin: no rashes, lesions, ulcers. No induration Neurologic: Nonfocal  Labs on Admission: I have personally reviewed following labs and imaging studies  CBC: Recent Labs  Lab 04/24/24 1007  WBC 6.7  NEUTROABS 5.0  HGB 13.0  HCT 42.5  MCV 93.2  PLT 216   Basic Metabolic Panel: Recent Labs  Lab 04/24/24 1007  NA 139  K 4.5  CL 105  CO2 26  GLUCOSE 187*  BUN 30*  CREATININE 1.35*  CALCIUM 9.7   Liver Function Tests: Recent Labs  Lab 04/24/24 1007  AST 22  ALT 19  ALKPHOS 80  BILITOT 0.7  PROT 6.6  ALBUMIN  3.9   Coagulation Profile: No results for input(s): "INR", "PROTIME" in the last 168 hours. BNP (last 3 results) No results for input(s): "PROBNP" in the last 8760 hours. CBG: No results for input(s): "GLUCAP" in the last 168 hours. Thyroid  Function Tests: Recent Labs    04/24/24 1007  TSH 1.462   Urine analysis:    Component Value Date/Time   COLORURINE YELLOW 11/20/2023 1133   APPEARANCEUR CLOUDY (A) 11/20/2023 1133   LABSPEC 1.016 11/20/2023 1133   PHURINE 5.0 11/20/2023 1133   GLUCOSEU NEGATIVE 11/20/2023 1133   HGBUR MODERATE (A) 11/20/2023 1133   BILIRUBINUR NEGATIVE 11/20/2023 1133   KETONESUR NEGATIVE 11/20/2023 1133   PROTEINUR 30 (A) 11/20/2023 1133   UROBILINOGEN 0.2 12/12/2012 1240   NITRITE NEGATIVE 11/20/2023 1133   LEUKOCYTESUR LARGE (A) 11/20/2023 1133      Radiological Exams on Admission: No results found.  EKG: Independently reviewed.  Sinus rhythm  Assessment/Plan Principal problem Lower GI bleed-patient with history of the same, gastroenterology consulted and will evaluate patient.  Appreciate input.  For now hemoglobin is stable at 13, but will anticipate it will go down.  She has had prior bleeds in the past and follows with [facility] - Hold home Xarelto   Active problems PAF-was recently cardioverted from atypical a flutter on 03/29/2024.  Currently she is maintaining sinus rhythm.  Resume home amiodarone , monitor on telemetry  CKD 3B -baseline creatinine ranging between 1.3-1.6, currently at baseline  Essential hypertension-would favor to hold antihypertensives and her GI bleed but her blood pressure is high at this point.  Continue to monitor for now, await medication reconciliation but may need to start antihypertensives later on  Intermittent tremors -uses Valium  as needed at home   DVT prophylaxis: SCDs  Code Status: Full code  Family Communication: friend at bedside  Disposition Plan: home when ready  Bed Type: Medical telemetry Consults called: Gastroenterology Obs/Inp: Observation  Kathlen Para, MD, PhD [facility]  Contact via [email or web address]  04/24/2024, 11:35 AM

## 2024-04-24 NOTE — ED Provider Triage Note (Signed)
 Emergency Medicine Provider Triage Evaluation Note  GABBI WHETSTONE , a 89 y.o. female  was evaluated in triage.  Pt complains of rectal bleeding lightheadedness, fatigue, generalized weakness.  Review of Systems  Positive: Left-sided abdominal discomfort, lightheadedness, rectal bleeding, fatigue, malaise, dysuria Negative: Chest pain, shortness breath, palpitations, syncope.   Physical Exam  BP (!) 143/69 (BP Location: Right Arm)   Pulse 61   Temp 97.8 F (36.6 C)   Resp 17   Ht 5' (1.524 m)   Wt 58.5 kg   SpO2 98%   BMI 25.19 kg/m  Gen:   Awake, no distress, slightly pale Resp:  Normal effort  MSK:   Moves extremities without difficulty  Other:  L abdomen slightly tender  Medical Decision Making  Medically screening exam initiated at 9:30 AM.  Appropriate orders placed.  DEVIN FOSKEY was informed that the remainder of the evaluation will be completed by another provider, this initial triage assessment does not replace that evaluation, and the importance of remaining in the ED until their evaluation is complete.  Terril LYNE KHURANA is a 89 y.o. female with a past medical history significant for hypertension, diabetes, previous diverticular hemorrhage requiring clips and intervention, atrial fibrillation now on Xarelto , CKD, and GERD who presents with rectal bleeding fatigue lightheadedness and malaise.  Cording to patient, she has had some abdominal pain in her left abdomen for several months but started having fatigue and malaise and not feeling right yesterday.  She reports overnight she got to the bathroom and had rectal bleeding that was both bright and dark.  She reports she has had some left abdominal discomfort but no nausea or vomiting.  She reports some dysuria but not having it now.  She denies any fevers, chills, congestion, cough.  She says that she has had multiple previous clips in her GI tract due to rectal bleeding episodes and hemorrhage although she says she has not had one  since she has been started on Xarelto  this past year.  She has not passed out but is felt fatigued and lightheaded.  On exam, lungs clear.  Chest nontender.  Left abdomen is tender.  Will defer rectal exam to provider in the back and not here in triage.  Bowel sounds appreciated.  Flanks and back nontender.  Given the patient's discomfort, history of hemorrhage requiring intervention, and the bleeding she was describing overnight with her symptoms, we will get type and screen, get screening labs, and will get a CT angio to look for acute GI bleed and active extravasation.  Will keep her n.p.o.  Anticipate further evaluation when she get seen by a provider in the back.    Demitrios Molyneux, Marine Sia, MD 04/24/24 [phone]

## 2024-04-24 NOTE — Consult Note (Addendum)
 Consultation Note   Referring Provider:  [facility] PCP: Briana Half, FNP Primary Gastroenterologist:  Previously Dr. Sandrea Cruel       Reason for Consultation:  GI bleed DOA: 04/24/2024         Hospital Day: 1   Attending physician's note  I personally saw the patient and performed a substantive portion of this encounter (>50% time spent), including a complete performance of at least one of the key components (MDM, Hx and/or Exam), in conjunction with the APP.  I agree with the APP's note, impression, and recommendations with additional input as follows.    89 year old very pleasant female on chronic anticoagulation with Xarelto  for A-fib admitted with painless hematochezia, similar presentation in 2022 and January 2025, consistent with lower GI diverticular hemorrhage Continue clear liquid diet Hold Xarelto  Supportive care Monitor hemoglobin and transfuse if below 7  Order stat CT angio if has recurrent large-volume hematochezia and consult IR for embolization   The patient was provided an opportunity to ask questions and all were answered. The patient agreed with the plan and demonstrated an understanding of the instructions.  Briana [name] , MD [phone]     ASSESSMENT    89 year old female with recurrent painless hematochezia, suspect recurrent diverticular bleed on Xarelto .  She has a history of diverticular bleeding requiring sigmoidoscopy and clip placement in 2022 and again in January 2025.  She is hemodynamically stable.  Presenting hemoglobin 13 ( baseline mid 13 to mid 14).   Atrial flutter Status post cardioversion 03/29/24.  Maintained on Xarelto   CKD 3B Renal function around baseline with BUN 30, creatinine 1.35  See PMH for additional history  Principal Problem:   Lower GI bleeding     PLAN:   --Monitor H&H, I suspect it will decline --Clear liquid diet ok --Consider CT angio if bleeding  continues/persists though she does have chronic kidney disease.   HPI   89 y.o. year old female with a medical history including but not limited to atrial flutter on Xarelto , hypertension, diastolic heart failure, CKD 3b, and GERD  Brief GI history  Briana [name] has a history of diverticular bleeding in 2022 and Jan 2025 at which time CT angiogram was positive for arterial contrast extravasation into the sigmoid colon consistent with an active diverticular bleed.  She underwent sigmoidoscopy with clip placement.    Interval workup Briana [name] to the bathroom to urinate around 2:00 this morning.  During that time she had a gush of red blood from her rectum.  She believes she had a second episode prior to coming to the ED. In the last several hours she has passed some blood with flatus but no significant bleeding.  She describes the blood as being red, in the ED on rectal exam there was a maroon stool in the vault.  Bleeding was not associated with any abdominal pain.  She has chronic left upper quadrant pain after eating but it usually resolves with passage of flatus.  Bleeding not associated with any nausea, vomiting or other GI symptoms.  Prior to onset of bleeding this morning she felt to be in her regular state of health.  She takes Xarelto , last dose was last night.  No NSAID use.  Labs and Imaging:  Recent Labs    04/24/24 1007  PROT 6.6  ALBUMIN  3.9  AST 22  ALT 19  ALKPHOS 80  BILITOT 0.7   Recent Labs    04/24/24 1007  WBC 6.7  HGB 13.0  HCT 42.5  MCV 93.2  PLT 216   Recent Labs    04/24/24 1007  NA 139  K 4.5  CL 105  CO2 26  GLUCOSE 187*  BUN 30*  CREATININE 1.35*  CALCIUM 9.7     EP STUDY See surgical note for result.    Pertinent GI Studies   *Most recent endoscopic evaluations  January 2025 sigmoidoscopy for hematochezia -- Significant amount of retained stool, visualization was difficult . Scattered large mouth, medium mouth and small mouth diverticula  in the sigmoid colon where a clot was found and clip placed in region believed to correlate with CTA.    April 2022 sigmoidoscopy for hematochezia -- Severe diverticulosis in the sigmoid colon and descending colon.  Impacted diverticulum.  There was evidence of recent bleeding from the diverticular opening.  There was evidence of past bleeding from the diverticular opening with an adherent clot.  Clips placed  Past Medical History:  Diagnosis Date   Acute on chronic diastolic (congestive) heart failure (HCC) 11/21/2018   Cancer (HCC)    skin cancer   DDD (degenerative disc disease)    Diverticula of colon    Diverticulitis    DJD (degenerative joint disease)    GERD (gastroesophageal reflux disease)    takes ranitidine   Heart murmur    History of hiatal hernia    History of pneumonia    Hx of irritable bowel syndrome    Hyperlipidemia    Hypertension    Occasional tremors    Osteoporosis    Pre-diabetes    Varicose veins    Vitamin D  deficiency     Past Surgical History:  Procedure Laterality Date   APPENDECTOMY     BREAST EXCISIONAL BIOPSY Right    BREAST SURGERY Right    fluid drained from breast   CARDIOVERSION N/A 12/20/2018   Procedure: CARDIOVERSION;  Surgeon: Sheryle Donning, MD;  Location: [facility] ENDOSCOPY;  Service: Cardiovascular;  Laterality: N/A;   CARDIOVERSION N/A 06/16/2019   Procedure: CARDIOVERSION;  Surgeon: Elmyra Haggard, MD;  Location: [facility] ENDOSCOPY;  Service: Cardiovascular;  Laterality: N/A;   CARDIOVERSION N/A 05/17/2023   Procedure: CARDIOVERSION;  Surgeon: Elmyra Haggard, MD;  Location: [facility] INVASIVE CV LAB;  Service: Cardiovascular;  Laterality: N/A;   CARDIOVERSION N/A 03/29/2024   Procedure: CARDIOVERSION;  Surgeon: Sheryle Donning, MD;  Location: [facility] INVASIVE CV LAB;  Service: Cardiovascular;  Laterality: N/A;   COLONOSCOPY     ESOPHAGOGASTRODUODENOSCOPY     FIDUCIAL MARKER PLACEMENT  11/20/2023   Procedure: CLIP MARKER PLACEMENT;  Surgeon: Alvis Jourdain, MD;  Location: [facility] ENDOSCOPY;  Service: Gastroenterology;;   Milta Alosa SIGMOIDOSCOPY N/A 03/09/2021   Procedure: FLEXIBLE SIGMOIDOSCOPY;  Surgeon: Sergio Dandy, MD;  Location: [facility] ENDOSCOPY;  Service: Endoscopy;  Laterality: N/A;   FLEXIBLE SIGMOIDOSCOPY N/A 11/20/2023   Procedure: FLEXIBLE SIGMOIDOSCOPY;  Surgeon: Alvis Jourdain, MD;  Location: [facility] ENDOSCOPY;  Service: Gastroenterology;  Laterality: N/A;   HEMOSTASIS CLIP PLACEMENT  03/09/2021   Procedure: HEMOSTASIS CLIP PLACEMENT;  Surgeon: Sergio Dandy, MD;  Location: [facility] ENDOSCOPY;  Service: Endoscopy;;   JOINT REPLACEMENT Left    left hip   LUMBAR LAMINECTOMY/DECOMPRESSION MICRODISCECTOMY N/A 06/05/2015   Procedure: L3-L5 DECOMPRESSION ;  Surgeon:  Mort Ards, MD;  Location: [facility] OR;  Service: Orthopedics;  Laterality: N/A;   OPEN REDUCTION INTERNAL FIXATION (ORIF) DISTAL RADIAL FRACTURE Right 01/10/2014   Procedure: OPEN REDUCTION INTERNAL FIXATION (ORIF) DISTAL RADIAL FRACTURE;  Surgeon: Shellie Dials, MD;  Location: [facility] OR;  Service: Orthopedics;  Laterality: Right;   SPINAL CORD DECOMPRESSION  06/05/2015   L3 L 5   THYROID  SURGERY     TONSILLECTOMY     TUBAL LIGATION     varicose veins stripped     WRIST FRACTURE SURGERY Bilateral     Family History  Problem Relation Age of Onset   Hypertension Mother    Hyperlipidemia Mother    Heart disease Sister    Cancer Brother        prostate   Colon cancer Neg Hx    Esophageal cancer Neg Hx    Pancreatic cancer Neg Hx    Stomach cancer Neg Hx    Liver disease Neg Hx    Breast cancer Neg Hx     Prior to Admission medications   Medication Sig Start Date End Date Taking? Authorizing Provider  amiodarone  (PACERONE ) 200 MG tablet Take 1 tablet (200 mg total) by mouth daily. Patient taking differently: Take 200 mg by mouth 2 (two) times daily. 03/28/24  Yes Fenton, Clint R, PA  Ascorbic Acid  (VITAMIN C  PO) Take 1 tablet by mouth daily.   Yes [provider]   cholecalciferol  (VITAMIN D3) 25 MCG (1000 UNIT) tablet Take 3,000 Units by mouth daily.   Yes [provider]  diazepam  (VALIUM ) 2 MG tablet Take 1 tablet 3 - 4 x /day for Tremor                                     /                                 TAKE                    BY                     MOUTH Patient taking differently: Take 2 mg by mouth as needed (Tremors). 06/20/22  Yes Vangie Genet, MD  diltiazem  (CARDIZEM  CD) 180 MG 24 hr capsule Take 1 capsule  Daily for Heart & BP                                                                                                   /                          TAKE                                      BY  MOUTH                                                   ONCE ? DAILY 02/25/24  Yes Briana Half, FNP  furosemide  (LASIX ) 40 MG tablet Take 1 tablet (40 mg total) by mouth daily. 06/14/23 06/13/24 Yes Wilkinson, Dana E, FNP  Rivaroxaban  (XARELTO ) 15 MG TABS tablet TAKE 1 TABLET BY MOUTH ONCE DAILY WITH  SUPPER  STOP  ELIQUIS  12/13/23  Yes Fenton, Clint R, PA  albuterol  (PROAIR  HFA) 108 (90 Base) MCG/ACT inhaler Take  2 inhalations  15 minutes apart  every 4 hours  as needed for Wheezing Patient not taking: Reported on 04/24/2024 03/09/22   Vangie Genet, MD  Multiple Vitamins-Minerals (ZINC PO) Take 1 capsule by mouth daily.   Yes [provider]  polyethylene glycol (MIRALAX  / GLYCOLAX ) 17 g packet Take 17 g by mouth daily. Patient not taking: Reported on 04/24/2024 03/11/21   Akula, Vijaya, MD    Current Facility-Administered Medications  Medication Dose Route Frequency Provider Last Rate Last Admin   amiodarone  (PACERONE ) tablet 200 mg  200 mg Oral BID Gherghe, Costin M, MD       diazepam  (VALIUM ) tablet 2 mg  2 mg Oral PRN Gherghe, Costin M, MD       hydrALAZINE  (APRESOLINE ) injection 5 mg  5 mg Intravenous Once Gherghe, Costin M, MD       hydrALAZINE  (APRESOLINE ) tablet 10 mg  10 mg Oral  Q8H Gherghe, Costin M, MD        Allergies as of 04/24/2024 - Review Complete 04/24/2024  Allergen Reaction Noted   Codeine Rash 09/13/2023   Accupril [quinapril hcl] Cough 11/16/2013   Prednisone   01/10/2014   Reglan [metoclopramide] Other (See Comments) 11/16/2013   Tramadol  Nausea And Vomiting 05/30/2015   Adhesive [tape] Rash 12/12/2012   Neosporin [neomycin-bacitracin zn-polymyx] Itching and Rash 05/30/2015    Social History   Socioeconomic History   Marital status: Widowed    Spouse name: Not on file   Number of children: Not on file   Years of education: Not on file   Highest education level: Not on file  Occupational History   Not on file  Tobacco Use   Smoking status: Never   Smokeless tobacco: Never   Tobacco comments:    Never smoked 09/13/23  Vaping Use   Vaping status: Never Used  Substance and Sexual Activity   Alcohol  use: No   Drug use: No   Sexual activity: Not on file  Other Topics Concern   Not on file  Social History Narrative   Not on file   Social Drivers of Health   Financial Resource Strain: Not on file  Food Insecurity: No Food Insecurity (04/24/2024)   Hunger Vital Sign    Worried About Running Out of Food in the Last Year: Never true    Ran Out of Food in the Last Year: Never true  Transportation Needs: No Transportation Needs (04/24/2024)   PRAPARE - Administrator, Civil Service (Medical): No    Lack of Transportation (Non-Medical): No  Physical Activity: Not on file  Stress: Not on file  Social Connections: Moderately Integrated (04/24/2024)   Social Connection and Isolation Panel [NHANES]    Frequency of Communication with Friends and Family: More than three times a  week    Frequency of Social Gatherings with Friends and Family: Twice a week    Attends Religious Services: More than 4 times per year    Active Member of [employer] or Organizations: Yes    Attends Banker Meetings: More than 4 times per year     Marital Status: Widowed  Intimate Partner Violence: Not At Risk (04/24/2024)   Humiliation, Afraid, Rape, and Kick questionnaire    Fear of Current or Ex-Partner: No    Emotionally Abused: No    Physically Abused: No    Sexually Abused: No     Code Status   Code Status: Full Code  Review of Systems: All systems reviewed and negative except where noted in HPI.  Physical Exam: Vital signs in last 24 hours: Temp:  [97.8 F (36.6 C)-98.1 F (36.7 C)] 98 F (36.7 C) (06/09 1223) Pulse Rate:  [52-61] 57 (06/09 1223) Resp:  [15-20] 18 (06/09 1223) BP: (143-200)/(59-78) 200/78 (06/09 1223) SpO2:  [98 %-99 %] 98 % (06/09 1223) Weight:  [58.5 kg] 58.5 kg (06/09 0845)    General:  Pleasant female in NAD Psych:  Cooperative. Normal mood and affect Eyes: Pupils equal Ears:  Normal auditory acuity Nose: No deformity, discharge or lesions Neck:  Supple, no masses felt Lungs:  Clear to auscultation.  Heart:  Regular rate  Abdomen:  Soft, nondistended, mild left mid abdominal tenderness. Active bowel sounds, no masses felt Rectal :  Deferred Msk: Symmetrical without gross deformities.  Neurologic:  Alert, oriented, grossly normal neurologically Extremities : No edema Skin:  Intact without significant lesions.    Intake/Output from previous day: No intake/output data recorded. Intake/Output this shift:  No intake/output data recorded.   Mai Schwalbe, NP-C   04/24/2024, 12:59 PM

## 2024-04-25 ENCOUNTER — Observation Stay (HOSPITAL_COMMUNITY)

## 2024-04-25 DIAGNOSIS — K5730 Diverticulosis of large intestine without perforation or abscess without bleeding: Secondary | ICD-10-CM

## 2024-04-25 DIAGNOSIS — Z7901 Long term (current) use of anticoagulants: Secondary | ICD-10-CM | POA: Diagnosis not present

## 2024-04-25 DIAGNOSIS — K922 Gastrointestinal hemorrhage, unspecified: Secondary | ICD-10-CM | POA: Diagnosis not present

## 2024-04-25 LAB — HEMOGLOBIN AND HEMATOCRIT, BLOOD
HCT: 35.8 % — ABNORMAL LOW (ref 36.0–46.0)
HCT: 35 % — ABNORMAL LOW (ref 36.0–46.0)
Hemoglobin: 11.5 g/dL — ABNORMAL LOW (ref 12.0–15.0)
Hemoglobin: 11.1 g/dL — ABNORMAL LOW (ref 12.0–15.0)

## 2024-04-25 LAB — COMPREHENSIVE METABOLIC PANEL WITH GFR
ALT: 17 U/L (ref 0–44)
AST: 17 U/L (ref 15–41)
Albumin: 3.5 g/dL (ref 3.5–5.0)
Alkaline Phosphatase: 71 U/L (ref 38–126)
Anion gap: 10 (ref 5–15)
BUN: 24 mg/dL — ABNORMAL HIGH (ref 8–23)
CO2: 23 mmol/L (ref 22–32)
Calcium: 9.3 mg/dL (ref 8.9–10.3)
Chloride: 105 mmol/L (ref 98–111)
Creatinine, Ser: 1.18 mg/dL — ABNORMAL HIGH (ref 0.44–1.00)
GFR, Estimated: 44 mL/min — ABNORMAL LOW (ref >60)
Glucose, Bld: 161 mg/dL — ABNORMAL HIGH (ref 70–99)
Potassium: 4.1 mmol/L (ref 3.5–5.1)
Sodium: 138 mmol/L (ref 135–145)
Total Bilirubin: 0.8 mg/dL (ref 0.0–1.2)
Total Protein: 5.8 g/dL — ABNORMAL LOW (ref 6.5–8.1)

## 2024-04-25 LAB — CBC
HCT: 36.4 % (ref 36.0–46.0)
Hemoglobin: 11.5 g/dL — ABNORMAL LOW (ref 12.0–15.0)
MCH: 28.9 pg (ref 26.0–34.0)
MCHC: 31.6 g/dL (ref 30.0–36.0)
MCV: 91.5 fL (ref 80.0–100.0)
Platelets: 218 10*3/uL (ref 150–400)
RBC: 3.98 MIL/uL (ref 3.87–5.11)
RDW: 18.1 % — ABNORMAL HIGH (ref 11.5–15.5)
WBC: 9 10*3/uL (ref 4.0–10.5)
nRBC: 0 % (ref 0.0–0.2)

## 2024-04-25 MED ORDER — IOHEXOL 350 MG/ML SOLN
100.0000 mL | Freq: Once | INTRAVENOUS | Status: AC | PRN
  Administered 2024-04-25: 100 mL via INTRAVENOUS

## 2024-04-25 NOTE — Plan of Care (Signed)

## 2024-04-25 NOTE — TOC Initial Note (Signed)
 Transition of Care [facility]) - Initial/Assessment Note    Patient Details  Name: KEYMANI GLYNN MRN: [id number] Date of Birth: [birth year]  Transition of [facility]) CM/SW Contact:    Omie Bickers, RN Phone Number: 04/25/2024, 1:49 PM  Clinical Narrative:                  Eldora Greet w patient and her daughter at bedside.  Plan is for DC to home when stable. PAtient is from home, lives alone. Per daughter she drives, has access to cane, RW, may potentially need shower seat, has grab bars in shower and elevated toilet seat. Daughters accompany her to MD appointments.  We discussed supports after DC. They both decline HH therapies.  TOC will continue to follow  Expected Discharge Plan: Home/Self Care Barriers to Discharge: Continued Medical Work up   Patient Goals and CMS Choice Patient states their goals for this hospitalization and ongoing recovery are:: to go home          Expected Discharge Plan and Services   Discharge Planning Services: CM Consult   Living arrangements for the past 2 months: Single Family Home                                      Prior Living Arrangements/Services Living arrangements for the past 2 months: Single Family Home Lives with:: Self                   Activities of Daily Living   ADL Screening (condition at time of admission) Independently performs ADLs?: Yes (appropriate for developmental age) Is the patient deaf or have difficulty hearing?: Yes Does the patient have difficulty seeing, even when wearing glasses/contacts?: No Does the patient have difficulty concentrating, remembering, or making decisions?: No  Permission Sought/Granted                  Emotional Assessment              Admission diagnosis:  Lower GI bleeding [K92.2] Acute GI bleeding [K92.2] Diverticula of colon [K57.30] Patient Active Problem List   Diagnosis Date Noted   Lower GI bleeding 04/24/2024   Balance problem 02/23/2024   Abnormal CBC  02/23/2024   Gait instability 02/23/2024   Tremor 02/23/2024   Diverticulosis of intestine with bleeding 11/21/2023   Acute GI bleeding 11/20/2023   Persistent atrial fibrillation (HCC) 06/07/2023   Atypical atrial flutter (HCC) 04/15/2023   Diverticular hemorrhage    Hyperparathyroidism due to renal insufficiency (HCC) (Elev PTH 140 on 03/05/2021  03/06/2021   Aortic atherosclerosis (HCC) by Chest CTA on 11/21/2018.  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 07/01/2020   Hypercoagulable state due to persistent atrial fibrillation (HCC) 11/15/2019   Chronic diastolic heart failure (HCC) 04/03/2019   CKD stage 3 due to type 2 diabetes mellitus (HCC) 02/23/2018   Bilateral sensorineural hearing loss 07/02/2017   Obesity (BMI 30.0-34.9) 09/05/2015   Lumbar stenosis 06/05/2015   GERD 11/20/2013   Osteopenia 11/20/2013   Essential hypertension 11/19/2013   Type 2 diabetes mellitus with hyperlipidemia (HCC) 11/19/2013   Vitamin D  deficiency 11/19/2013   Diffuse cystic mastopathy 11/19/2013   PCP:  [name], FNP Pharmacy:   [id number] [address] ([address]), [place] - [address] [place] ([place]) [place] [address] Phone: [phone] Fax: [phone]  Social Drivers of Health (SDOH) Social History: SDOH Screenings   Food Insecurity: No Food Insecurity (04/24/2024)  Housing: Low Risk  (04/24/2024)  Transportation Needs: No Transportation Needs (04/24/2024)  Utilities: Not At Risk (04/24/2024)  Depression (PHQ2-9): Low Risk  (09/14/2023)  Social Connections: Moderately Integrated (04/24/2024)  Tobacco Use: Low Risk  (04/24/2024)   SDOH Interventions:     Readmission Risk Interventions     No data to display

## 2024-04-25 NOTE — Progress Notes (Addendum)
 Daily Progress Note  DOA: 04/24/2024 Hospital Day: 2  Cc:  Hematochezia  Brief History:  89 y.o. year old female with a medical history including but not limited to atrial flutter on Xarelto , hypertension, diastolic heart failure, CKD 3b, and GERD. Admitted with hematochezia, see 6/9 GI consult note  ASSESSMENT    89 year old female with recurrent painless hematochezia, suspect recurrent diverticular bleed on Xarelto .  She has a history of diverticular bleeding requiring sigmoidoscopy and clip placement in 2022 and again in January 2025.    Presenting hemoglobin 13 ( baseline mid 13 to mid 14) Today:  CTA done this am for bleeding and negative for active bleed. Hgb 11.5. Continues to have hematochezia.  Hemodynamically stable. Daughter in room.   Atrial flutter Status post cardioversion 03/29/24.  Maintained on Xarelto , last dose was night of 6/8   CKD 3B Renal function around baseline    Principal Problem:   Lower GI bleeding   PLAN   --Continue to trend H/H --We are not quite 48 hours out from her last dose of Xarelto . Hopefully bleeding will stop spontaneously. Sigmoidoscopy may be difficult to do in setting of active bleeding.    Subjective   Just had another episode of hematochezia ( about 3rd or 4th today).   Objective    Recent Labs    04/24/24 1007 04/24/24 1823 04/25/24 0310  WBC 6.7 6.3 9.0  HGB 13.0 11.8* 11.5*  HCT 42.5 37.6 36.4  MCV 93.2 91.7 91.5  PLT 216 210 218   Recent Labs    04/24/24 1007 04/25/24 0310  NA 139 138  K 4.5 4.1  CL 105 105  CO2 26 23  GLUCOSE 187* 161*  BUN 30* 24*  CREATININE 1.35* 1.18*  CALCIUM 9.7 9.3   Recent Labs    04/24/24 1007 04/25/24 0310  PROT 6.6 5.8*  ALBUMIN  3.9 3.5  AST 22 17  ALT 19 17  ALKPHOS 80 71  BILITOT 0.7 0.8      Imaging:  CT ANGIO GI BLEED CLINICAL DATA:  GI bleed  EXAM: CTA ABDOMEN AND PELVIS WITHOUT AND WITH CONTRAST  TECHNIQUE: Multidetector CT imaging of the  abdomen and pelvis was performed using the standard protocol during bolus administration of intravenous contrast. Multiplanar reconstructed images and MIPs were obtained and reviewed to evaluate the vascular anatomy.  RADIATION DOSE REDUCTION: This exam was performed according to the departmental dose-optimization program which includes automated exposure control, adjustment of the mA and/or kV according to patient size and/or use of iterative reconstruction technique.  CONTRAST:  OMNIPAQUE  IOHEXOL  350 MG/ML SOLN  COMPARISON:  November 20, 2023  FINDINGS: VASCULAR  Aorta: Normal caliber aorta without aneurysm, dissection, vasculitis or significant stenosis.  Celiac: Patent without evidence of aneurysm, dissection, vasculitis or significant stenosis.  SMA: Patent without evidence of aneurysm, dissection, vasculitis or significant stenosis.  Renals: Both renal arteries are patent without evidence of aneurysm, dissection, vasculitis, fibromuscular dysplasia or significant stenosis.  IMA: Patent without evidence of aneurysm, dissection, vasculitis or significant stenosis.  Inflow: Patent without evidence of aneurysm, dissection, vasculitis or significant stenosis.  Proximal Outflow: Bilateral common femoral and visualized portions of the superficial and profunda femoral arteries are patent without evidence of aneurysm, dissection, vasculitis or significant stenosis.  Veins: No obvious venous abnormality within the limitations of this arterial phase study.  Review of the MIP images confirms the above findings.  NON-VASCULAR  Lower chest: No infiltrates or consolidations, no pleural effusions  Hepatobiliary:  Liver normal size no masses no biliary dilatation. Gallbladder unremarkable. No gallstones.  Pancreas: Pancreas normal size. No masses calcifications or inflammatory changes.  Spleen: Spleen normal size.  No masses.  Adrenals/Urinary Tract: Adrenal glands  are normal size. Follow-up recommended. Kidneys are normal. No masses calcifications or hydronephrosis. Stable large bilateral renal cortical cysts largest in the lower pole of the left kidney measuring 8.8 x 10 cm.  Bosniak 1, No follow-up imaging is recommended.  JACR 2018 Feb; 264-273, Management of the Incidental Renal Mass on CT, [employer] 2021; 814-848, Bosniak Classification of Cystic Renal Masses, Version 2019.  Stomach/Bowel: No small or large bowel obstruction or inflammatory changes. Diffuse diverticulosis of the colon without diverticulitis  No active GI bleed  Metallic density about 1.7 cm longitudinal diameter 4 mm in thickness noted within the proximal sigmoid colon could represent foreign body swallowed by the patient. Could represent  Moderate amount of residual fecal material throughout the colon without obstruction or constipation.  Vascular/Lymphatic: No significant vascular findings are present. No enlarged abdominal or pelvic lymph nodes.  Reproductive: Inhomogeneous uterus with some abnormal enhancement correlate with fibroid changes.  Other: Anterior abdominal wall unremarkable without evidence of umbilical or inguinal hernias  Musculoskeletal: Visualized portion of the thoracolumbar spine and pelvic structures grossly unremarkable without evidence of fracture bony abnormalities or soft tissue masses.  IMPRESSION: VASCULAR No evidence of aortic aneurysm, dissection or significant stenosis.  No evidence of active GI bleed.  NON-VASCULAR No acute intra-abdominal or pelvic pathology.  *Diffuse diverticulosis of the colon without diverticulitis. *Metallic density noted within the proximal sigmoid colon could represent foreign body swallowed by the patient. Could represent Moderate amount of residual fecal material throughout the colon without obstruction or constipation. *Stable large bilateral renal cortical cysts largest in the lower pole of  the left kidney measuring 8.8 x 10 cm. Bosniak 1, No follow-up imaging is recommended.  NON-VASCULAR  Electronically Signed   By: Fredrich Jefferson M.D.   On: 04/25/2024 10:56     Scheduled inpatient medications:   amiodarone   200 mg Oral BID   Continuous inpatient infusions:  PRN inpatient medications: acetaminophen , diazepam   Vital signs in last 24 hours: Temp:  [97.7 F (36.5 C)-98.2 F (36.8 C)] 97.7 F (36.5 C) (06/10 1128) Pulse Rate:  [54-61] 54 (06/10 1128) Resp:  [18-19] 18 (06/10 1128) BP: (126-200)/(56-78) 160/69 (06/10 1128) SpO2:  [95 %-98 %] 97 % (06/10 1128) Last BM Date : 04/25/24  Intake/Output Summary (Last 24 hours) at 04/25/2024 1218 Last data filed at 04/24/2024 2111 Gross per 24 hour  Intake 763.53 ml  Output --  Net 763.53 ml    Intake/Output from previous day: 06/09 0701 - 06/10 0700 In: 763.5 [P.O.:120; IV Piggyback:643.5] Out: -  Intake/Output this shift: No intake/output data recorded.   Physical Exam:  General: Alert female in NAD Heart:  Regular rate and rhythm.  Pulmonary: Normal respiratory effort Abdomen: Soft, nondistended, nontender. Normal bowel sounds. Extremities: No lower extremity edema  Neurologic: Alert and oriented Psych: Pleasant. Cooperative     LOS: 0 days   Mai Schwalbe ,NP 04/25/2024, 12:18 PM    Attending physician's note   I personally saw the patient and performed a substantive portion of this encounter (>50% time spent), including a complete performance of at least one of the key components (MDM, Hx and/or Exam), in conjunction with the APP.  I agree with the APP's note, impression, and recommendations with additional input as follows.    She had recurrent  episodes of hematochezia this morning, CTA negative for active extravasation Hemoglobin trended down to 11.5  Continue to hold Xarelto , will need to discuss with cardiology if patient can stay off chronic anticoagulation given increased risk for  recurrent GI bleed requiring frequent hospitalizations Patient and daughter were wondering if they could see cardiology during inpatient stay and she was scheduled to undergo echocardiogram as outpatient and would like to get that done during this hospitalization if possible  Continue clear liquid diet  The patient was provided an opportunity to ask questions and all were answered. The patient agreed with the plan and demonstrated an understanding of the instructions.   Lorena Rolling , MD [phone]

## 2024-04-25 NOTE — Progress Notes (Signed)
 PROGRESS NOTE  Briana [name] XBJ:[id number] DOB: 1934/10/02 DOA: 04/24/2024 PCP: [name], FNP   LOS: 0 days   Brief Narrative / Interim history: 89 y.o. female with medical history significant of a flutter with recent cardioversion in May 2025, on chronic anticoagulation with Xarelto , diastolic CHF, HTN, HLD, prior history of GI bleed from colonic diverticulosis, coming to the hospital with bright red blood per rectum.  She has a history of diverticular bleed.  GI consulted  Subjective / 24h Interval events: Complains of ongoing bleeding overnight, including this morning has had large bowel movement with blood  Assesement and Plan: Principal problem Lower GI bleed -patient with history of the same, gastroenterology consulted and following.  Has been having increased bleeding this morning, obtain a stat CT angio scan  Active problems Acute blood loss anemia-hemoglobin trending down, anticipate will go down even further.  Continue to monitor and transfuse for hemoglobin less than 8 given cardiac issues  PAF-status post cardioversion from atypical a flutter on 03/29/2024.  Currently maintaining sinus rhythm.  Continue amiodarone   Essential hypertension-pretty hypertensive on admission, now normotensive  CKD 3B-baseline creatinine between 1.3-1.6, currently slightly better than baseline.  Will obtain a CT scan as above, closely watch creatinine  Intermittent tremors-Valium  PRN  Scheduled Meds:  amiodarone   200 mg Oral BID   Continuous Infusions: PRN Meds:.acetaminophen , diazepam   Current Outpatient Medications  Medication Instructions   albuterol  (PROAIR  HFA) 108 (90 Base) MCG/ACT inhaler Take  2 inhalations  15 minutes apart  every 4 hours  as needed for Wheezing   amiodarone  (PACERONE ) 200 mg, Oral, Daily   Ascorbic Acid  (VITAMIN C  PO) 1 tablet, Oral, Daily   cholecalciferol  (VITAMIN D3) 3,000 Units, Oral, Daily   diazepam  (VALIUM ) 2 MG tablet Take 1 tablet 3 - 4 x  /day for Tremor                                     /                                 TAKE                    BY                     MOUTH   diltiazem  (CARDIZEM  CD) 180 MG 24 hr capsule Take 1 capsule  Daily for Heart & BP                                                                                                   /                          TAKE  BY                                            MOUTH                                                   ONCE ? DAILY   furosemide  (LASIX ) 40 mg, Oral, Daily   Multiple Vitamins-Minerals (ZINC PO) 1 capsule, Oral, Daily   polyethylene glycol (MIRALAX  / GLYCOLAX ) 17 g, Oral, Daily   Rivaroxaban  (XARELTO ) 15 MG TABS tablet TAKE 1 TABLET BY MOUTH ONCE DAILY WITH  SUPPER  STOP  ELIQUIS     Diet Orders (From admission, onward)     Start     Ordered   04/24/24 1539  Diet clear liquid Room service appropriate? Yes; Fluid consistency: Thin  Diet effective now       Question Answer Comment  Room service appropriate? Yes   Fluid consistency: Thin      04/24/24 1538            DVT prophylaxis: SCDs Start: 04/24/24 1217   Lab Results  Component Value Date   PLT 218 04/25/2024      Code Status: Full Code  Family Communication: No family at bedside  Status is: Observation The patient will require care spanning > 2 midnights and should be moved to inpatient because: Actively bleeding   Level of care: Telemetry Medical  Consultants:  Gastroenterology  Objective: Vitals:   04/24/24 2046 04/24/24 2359 04/25/24 0439 04/25/24 0802  BP: (!) 159/70 (!) 153/71 (!) 157/71 135/68  Pulse: (!) 56 61 (!) 59 (!) 57  Resp: 18 18 18    Temp: 98.2 F (36.8 C) 97.7 F (36.5 C) 97.8 F (36.6 C) 97.8 F (36.6 C)  TempSrc: Oral Oral Oral   SpO2: 95% 96% 97% 96%  Weight:      Height:        Intake/Output Summary (Last 24 hours) at 04/25/2024 0912 Last data filed at 04/24/2024 2111 Gross per 24 hour  Intake 763.53  ml  Output --  Net 763.53 ml   Wt Readings from Last 3 Encounters:  04/24/24 58.5 kg  03/29/24 59.4 kg  03/28/24 59.4 kg    Examination:  Constitutional: NAD Eyes: no scleral icterus ENMT: Mucous membranes are moist.  Neck: normal, supple Respiratory: clear to auscultation bilaterally, no wheezing, no crackles. Normal respiratory effort.  Cardiovascular: Regular rate and rhythm, no murmurs / rubs / gallops. No LE edema.  Abdomen: non distended, no tenderness. Bowel sounds positive.  Musculoskeletal: no clubbing / cyanosis.   Data Reviewed: I have independently reviewed following labs and imaging studies   CBC Recent Labs  Lab 04/24/24 1007 04/24/24 1823 04/25/24 0310  WBC 6.7 6.3 9.0  HGB 13.0 11.8* 11.5*  HCT 42.5 37.6 36.4  PLT 216 210 218  MCV 93.2 91.7 91.5  MCH 28.5 28.8 28.9  MCHC 30.6 31.4 31.6  RDW 18.1* 18.0* 18.1*  LYMPHSABS 1.0  --   --   MONOABS 0.6  --   --   EOSABS 0.1  --   --   BASOSABS 0.0  --   --     Recent Labs  Lab 04/24/24 1007 04/25/24 0310  NA  139 138  K 4.5 4.1  CL 105 105  CO2 26 23  GLUCOSE 187* 161*  BUN 30* 24*  CREATININE 1.35* 1.18*  CALCIUM 9.7 9.3  AST 22 17  ALT 19 17  ALKPHOS 80 71  BILITOT 0.7 0.8  ALBUMIN  3.9 3.5  TSH 1.462  --     ------------------------------------------------------------------------------------------------------------------ No results for input(s): "CHOL", "HDL", "LDLCALC", "TRIG", "CHOLHDL", "LDLDIRECT" in the last 72 hours.  Lab Results  Component Value Date   HGBA1C 6.9 (H) 02/23/2024   ------------------------------------------------------------------------------------------------------------------ Recent Labs    04/24/24 1007  TSH 1.462    Cardiac Enzymes No results for input(s): "CKMB", "TROPONINI", "MYOGLOBIN" in the last 168 hours.  Invalid input(s): "CK" ------------------------------------------------------------------------------------------------------------------     Component Value Date/Time   BNP 464.4 (H) 01/22/2021 1745    CBG: No results for input(s): "GLUCAP" in the last 168 hours.  No results found for this or any previous visit (from the past 240 hours).   Radiology Studies: No results found.   Kathlen Para, MD, PhD [facility]  Between 7 am - 7 pm I am available, please contact me via Amion (for emergencies) or Securechat (non urgent messages)  Between 7 pm - 7 am I am not available, please contact night coverage MD/APP via Amion

## 2024-04-26 ENCOUNTER — Ambulatory Visit (HOSPITAL_COMMUNITY)

## 2024-04-26 ENCOUNTER — Observation Stay (HOSPITAL_COMMUNITY)

## 2024-04-26 ENCOUNTER — Ambulatory Visit (HOSPITAL_COMMUNITY): Admitting: Physician Assistant

## 2024-04-26 DIAGNOSIS — I4892 Unspecified atrial flutter: Secondary | ICD-10-CM

## 2024-04-26 DIAGNOSIS — Z7901 Long term (current) use of anticoagulants: Secondary | ICD-10-CM | POA: Diagnosis not present

## 2024-04-26 DIAGNOSIS — K922 Gastrointestinal hemorrhage, unspecified: Secondary | ICD-10-CM | POA: Diagnosis not present

## 2024-04-26 LAB — HEMOGLOBIN AND HEMATOCRIT, BLOOD
HCT: 35 % — ABNORMAL LOW (ref 36.0–46.0)
HCT: 36.8 % (ref 36.0–46.0)
Hemoglobin: 11 g/dL — ABNORMAL LOW (ref 12.0–15.0)
Hemoglobin: 11.5 g/dL — ABNORMAL LOW (ref 12.0–15.0)

## 2024-04-26 LAB — URINE CULTURE: Culture: >=100000 — AB

## 2024-04-26 LAB — ECHOCARDIOGRAM COMPLETE
AR max vel: 1.67 cm2
AV Area VTI: 1.79 cm2
AV Area mean vel: 1.65 cm2
AV Mean grad: 5 mmHg
AV Peak grad: 10.1 mmHg
Ao pk vel: 1.59 m/s
Area-P 1/2: 4.01 cm2
Calc EF: 70.4 %
Height: 60 in
S' Lateral: 2.5 cm
Single Plane A2C EF: 69.3 %
Single Plane A4C EF: 71.7 %
Weight: 2064 [oz_av]

## 2024-04-26 MED ORDER — RIVAROXABAN 15 MG PO TABS
15.0000 mg | ORAL_TABLET | Freq: Every day | ORAL | Status: DC
Start: 2024-05-03 — End: 2024-06-06

## 2024-04-26 NOTE — Plan of Care (Signed)
  Problem: Education: Goal: Knowledge of General Education information will improve Description: Including pain rating scale, medication(s)/side effects and non-pharmacologic comfort measures Outcome: Progressing   Problem: Health Behavior/Discharge Planning: Goal: Ability to manage health-related needs will improve Outcome: Progressing   Problem: Clinical Measurements: Goal: Ability to maintain clinical measurements within normal limits will improve Outcome: Progressing   Problem: Activity: Goal: Risk for activity intolerance will decrease Outcome: Progressing   Problem: Nutrition: Goal: Adequate nutrition will be maintained Outcome: Progressing   Problem: Elimination: Goal: Will not experience complications related to bowel motility Outcome: Progressing   Problem: Pain Managment: Goal: General experience of comfort will improve and/or be controlled Outcome: Progressing   Problem: Skin Integrity: Goal: Risk for impaired skin integrity will decrease Outcome: Progressing

## 2024-04-26 NOTE — Plan of Care (Incomplete)
  Problem: Education: Goal: Knowledge of General Education information will improve Description: Including pain rating scale, medication(s)/side effects and non-pharmacologic comfort measures Outcome: Progressing   Problem: Health Behavior/Discharge Planning: Goal: Ability to manage health-related needs will improve Outcome: Progressing   Problem: Clinical Measurements: Goal: Ability to maintain clinical measurements within normal limits will improve Outcome: Progressing   Problem: Activity: Goal: Risk for activity intolerance will decrease Outcome: Progressing   Problem: Nutrition: Goal: Adequate nutrition will be maintained Outcome: Progressing   Problem: Elimination: Goal: Will not experience complications related to bowel motility Outcome: Progressing   Problem: Pain Managment: Goal: General experience of comfort will improve and/or be controlled Outcome: Progressing   Problem: Skin Integrity: Goal: Risk for impaired skin integrity will decrease Outcome: Progressing

## 2024-04-26 NOTE — Progress Notes (Addendum)
 Daily Progress Note  DOA: 04/24/2024 Hospital Day: 3  Cc:  Hematochezia  Brief History:  89 y.o. year old female with a medical history including but not limited to atrial flutter on Xarelto , hypertension, diastolic heart failure, CKD 3b, and GERD. Admitted with hematochezia, see 6/9 GI consult note  ASSESSMENT    89 year old female with recurrent painless hematochezia, suspect recurrent diverticular bleed on Xarelto .  She has a history of diverticular bleeding requiring sigmoidoscopy and clip placement in 2022 and again in January 2025.    Presenting hemoglobin 13 ( baseline mid 13 to mid 14) Today:  CTA yesterday negative for active bleeding. Hgb overall stable at 11.  Last episode of bleeding was around 2 pm yesterday. She feels fine.   Atrial flutter Status post cardioversion 03/29/24.  Maintained on Xarelto    CKD 3B Renal function around baseline    Principal Problem:   Lower GI bleeding Active Problems:   Diverticula of colon   PLAN   --Nearly 24 hours out from last episode of bleeding.  She should be able to go home this afternoon.  Would recommend holding Xarelto  for 1 week.  It would be even better if she was able to stop it all together since this is her 3rd bleed  Subjective   No further bleeding since yesterday.  She feels fine.   Objective    Recent Labs    04/24/24 1007 04/24/24 1823 04/25/24 0310 04/25/24 1401 04/25/24 1856 04/26/24 0041 04/26/24 0741  WBC 6.7 6.3 9.0  --   --   --   --   HGB 13.0 11.8* 11.5*   < > 11.1* 11.5* 11.0*  HCT 42.5 37.6 36.4   < > 35.0* 36.8 35.0*  MCV 93.2 91.7 91.5  --   --   --   --   PLT 216 210 218  --   --   --   --    < > = values in this interval not displayed.   Recent Labs    04/24/24 1007 04/25/24 0310  NA 139 138  K 4.5 4.1  CL 105 105  CO2 26 23  GLUCOSE 187* 161*  BUN 30* 24*  CREATININE 1.35* 1.18*  CALCIUM 9.7 9.3   Recent Labs    04/24/24 1007 04/25/24 0310  PROT 6.6 5.8*   ALBUMIN  3.9 3.5  AST 22 17  ALT 19 17  ALKPHOS 80 71  BILITOT 0.7 0.8    Imaging:  CT ANGIO GI BLEED CLINICAL DATA:  GI bleed  EXAM: CTA ABDOMEN AND PELVIS WITHOUT AND WITH CONTRAST  TECHNIQUE: Multidetector CT imaging of the abdomen and pelvis was performed using the standard protocol during bolus administration of intravenous contrast. Multiplanar reconstructed images and MIPs were obtained and reviewed to evaluate the vascular anatomy.  RADIATION DOSE REDUCTION: This exam was performed according to the departmental dose-optimization program which includes automated exposure control, adjustment of the mA and/or kV according to patient size and/or use of iterative reconstruction technique.  CONTRAST:  OMNIPAQUE  IOHEXOL  350 MG/ML SOLN  COMPARISON:  November 20, 2023  FINDINGS: VASCULAR  Aorta: Normal caliber aorta without aneurysm, dissection, vasculitis or significant stenosis.  Celiac: Patent without evidence of aneurysm, dissection, vasculitis or significant stenosis.  SMA: Patent without evidence of aneurysm, dissection, vasculitis or significant stenosis.  Renals: Both renal arteries are patent without evidence of aneurysm, dissection, vasculitis, fibromuscular dysplasia or significant stenosis.  IMA: Patent without evidence of aneurysm, dissection, vasculitis or  significant stenosis.  Inflow: Patent without evidence of aneurysm, dissection, vasculitis or significant stenosis.  Proximal Outflow: Bilateral common femoral and visualized portions of the superficial and profunda femoral arteries are patent without evidence of aneurysm, dissection, vasculitis or significant stenosis.  Veins: No obvious venous abnormality within the limitations of this arterial phase study.  Review of the MIP images confirms the above findings.  NON-VASCULAR  Lower chest: No infiltrates or consolidations, no pleural effusions  Hepatobiliary: Liver normal size no  masses no biliary dilatation. Gallbladder unremarkable. No gallstones.  Pancreas: Pancreas normal size. No masses calcifications or inflammatory changes.  Spleen: Spleen normal size.  No masses.  Adrenals/Urinary Tract: Adrenal glands are normal size. Follow-up recommended. Kidneys are normal. No masses calcifications or hydronephrosis. Stable large bilateral renal cortical cysts largest in the lower pole of the left kidney measuring 8.8 x 10 cm.  Bosniak 1, No follow-up imaging is recommended.  JACR 2018 Feb; 264-273, Management of the Incidental Renal Mass on CT, [employer] 2021; 814-848, Bosniak Classification of Cystic Renal Masses, Version 2019.  Stomach/Bowel: No small or large bowel obstruction or inflammatory changes. Diffuse diverticulosis of the colon without diverticulitis  No active GI bleed  Metallic density about 1.7 cm longitudinal diameter 4 mm in thickness noted within the proximal sigmoid colon could represent foreign body swallowed by the patient. Could represent  Moderate amount of residual fecal material throughout the colon without obstruction or constipation.  Vascular/Lymphatic: No significant vascular findings are present. No enlarged abdominal or pelvic lymph nodes.  Reproductive: Inhomogeneous uterus with some abnormal enhancement correlate with fibroid changes.  Other: Anterior abdominal wall unremarkable without evidence of umbilical or inguinal hernias  Musculoskeletal: Visualized portion of the thoracolumbar spine and pelvic structures grossly unremarkable without evidence of fracture bony abnormalities or soft tissue masses.  IMPRESSION: VASCULAR No evidence of aortic aneurysm, dissection or significant stenosis.  No evidence of active GI bleed.  NON-VASCULAR No acute intra-abdominal or pelvic pathology.  *Diffuse diverticulosis of the colon without diverticulitis. *Metallic density noted within the proximal sigmoid colon  could represent foreign body swallowed by the patient. Could represent Moderate amount of residual fecal material throughout the colon without obstruction or constipation. *Stable large bilateral renal cortical cysts largest in the lower pole of the left kidney measuring 8.8 x 10 cm. Bosniak 1, No follow-up imaging is recommended.  NON-VASCULAR  Electronically Signed   By: Fredrich Jefferson M.D.   On: 04/25/2024 10:56     Scheduled inpatient medications:   amiodarone   200 mg Oral BID   Continuous inpatient infusions:  PRN inpatient medications: acetaminophen , diazepam   Vital signs in last 24 hours: Temp:  [97.6 F (36.4 C)-98.5 F (36.9 C)] 97.8 F (36.6 C) (06/11 0753) Pulse Rate:  [54-70] 70 (06/11 0753) Resp:  [18-19] 18 (06/11 0753) BP: (141-180)/(62-92) 180/87 (06/11 0753) SpO2:  [95 %-100 %] 98 % (06/11 0753) Last BM Date : 04/24/24  Intake/Output Summary (Last 24 hours) at 04/26/2024 1115 Last data filed at 04/26/2024 0026 Gross per 24 hour  Intake 400 ml  Output --  Net 400 ml     Physical Exam:  General: Alert female in NAD Heart:  Regular rate and rhythm.  Pulmonary: Normal respiratory effort Abdomen: Soft, nondistended, nontender. Normal bowel sounds. Extremities: No lower extremity edema  Neurologic: Alert and oriented Psych: Pleasant. Cooperative    LOS: 0 days   Mai Schwalbe ,NP 04/26/2024, 11:16 AM    Attending physician's note   I personally saw the patient  and performed a substantive portion of this encounter (>50% time spent), including a complete performance of at least one of the key components (MDM, Hx and/or Exam), in conjunction with the APP.  I agree with the APP's note, impression, and recommendations with additional input as follows.   No further episodes of hematochezia, hemoglobin remained stable Continue to hold Xarelto  for at least 7 days, possibly discuss with cardiology to discontinue long-term anticoagulation given the risk for  recurrent GI bleed requiring hospitalizations. GI signing off, please call with any questions   The patient was provided an opportunity to ask questions and all were answered. The patient agreed with the plan and demonstrated an understanding of the instructions.   Lorena Rolling , MD [phone]

## 2024-04-26 NOTE — Progress Notes (Signed)
*  PRELIMINARY RESULTS* Echocardiogram 2D Echocardiogram has been performed.  Briana [name] 04/26/2024, 3:41 PM

## 2024-04-26 NOTE — Discharge Summary (Signed)
 Physician Discharge Summary  Briana [name]:[id number] DOB: 03-Jul-1934 DOA: 04/24/2024  PCP: [name], FNP  Admit date: 04/24/2024 Discharge date: 04/26/2024  Time spent: 40 minutes  Recommendations for Outpatient Follow-up:  Follow outpatient CBC/CMP  Follow with cardiology outpatient - discuss risks/benefits anticoagulation long term Follow heart rate, currently sinus rates in 60's (diltiazem  stopped on discharge)   If UTI symptoms, consider treating, now with asymptomatic bacteruria   Discharge Diagnoses:  Principal Problem:   Lower GI bleeding Active Problems:   Diverticula of colon   Discharge Condition: stable  Diet recommendation: heart healthy  Filed Weights   04/24/24 0845  Weight: 58.5 kg    History of present illness:   89 y.o. female with medical history significant of Briana [name] flutter with recent cardioversion in May 2025, on chronic anticoagulation with Xarelto , diastolic CHF, HTN, HLD, prior history of GI bleed from colonic diverticulosis, coming to the hospital with bright red blood per rectum.  She has Briana [name] history of diverticular bleed.  GI consulted.  CT angio GI bleed showed no evidence of active GI bleed.  Stable on 6/11 for discharge with plan to hold xarelto  x7 days and follow with cardiology outpatient.    Hospital Course:  Assessment and Plan:  Lower GI Bleed Acute Blood Loss Anemia Suspected Diverticular Bleed Hb relatively stable this AM (down to 11 from 13 on admission) GI recommending hold anticoagulation x 7 days and discuss with cardiology long term anticoagulation risk/benefits  Atrial Fibrillation S/p cardioversion 5/14 - ideally would have continued anticoagulation uninterrupted 4-6 weeks after this, but with bleeding above, currently on hold.  Will hold anticoagulation for 1 week, then resume xarelto .  Long term, she needs to have additional discussions regarding risks/benefits of anticoagulation and whether to continue xarelto  long  term.  Continue amiodarone , stop diltiazem  (sinus in 60's without dilt) Repeat echo pending at the time of discharge  Hypertension Stop diltiazem  given HR in 60's  CKD IIIb Noted, appears at baseline  Tremors Prn valium    E. Coli UTI Asymptomatic     Procedures: none   Consultations: GI  Discharge Exam: Vitals:   04/26/24 0753 04/26/24 1249  BP: (!) 180/87 (!) 146/64  Pulse: 70 (!) 56  Resp: 18 17  Temp: 97.8 F (36.6 C) 99.9 F (37.7 C)  SpO2: 98% 98%   No complaints Eager to advance diet  General: No acute distress. Cardiovascular: RRR Lungs: Clear to auscultation bilaterally  Abdomen: Soft, nontender, nondistended  Neurological: Alert and oriented 3. Moves all extremities 4 with equal strength. Cranial nerves II through XII grossly intact. Extremities: No clubbing or cyanosis. No edema.   Discharge Instructions   Discharge Instructions     Call MD for:  difficulty breathing, headache or visual disturbances   Complete by: As directed    Call MD for:  extreme fatigue   Complete by: As directed    Call MD for:  hives   Complete by: As directed    Call MD for:  persistant dizziness or light-headedness   Complete by: As directed    Call MD for:  persistant nausea and vomiting   Complete by: As directed    Call MD for:  redness, tenderness, or signs of infection (pain, swelling, redness, odor or green/yellow discharge around incision site)   Complete by: As directed    Call MD for:  severe uncontrolled pain   Complete by: As directed    Call MD for:  temperature >100.4   Complete  by: As directed    Diet - low sodium heart healthy   Complete by: As directed    Discharge instructions   Complete by: As directed    You were seen for Briana [name] gastrointestinal bleed.   This is related to your diverticulosis while you're on xarelto .    Your clinical situation is complex.  This is now at least your 3rd GI bleed.  You also have atrial fibrillation and Paulanthony Gleaves recent  cardioversion (the recent cardioversion puts you at higher risk of stroke).  We're planning to hold your xarelto  for 7 days, after this, I would resume your xarelto .  You should have more conversations with Dr. Loetta Ringer (or your outpatient cardiologist or PCP) as an outpatient regarding the long term plans for anticoagulation.  I don't think there's Rocket Gunderson perfect plan for this problem, but we'll have to balance the risk of stroke with the risk of potentially life threatening bleeding going forward.  We repeated your echo here (these results are pending, follow with cardiology for results).  You can follow this with cardiology outpatient.  Stop your diltiazem  as your heart rate is on the slow side.  Continue your amiodarone  and follow with cardiology outpatient.   Return for new, recurrent, or worsening symptoms.  Please ask your PCP to request records from this hospitalization so they know what was done and what the next steps will be.   Increase activity slowly   Complete by: As directed       Allergies as of 04/26/2024       Reactions   Codeine Rash   Accupril [quinapril Hcl] Cough   Prednisone     Agitated and shaky   Reglan [metoclopramide] Other (See Comments)   tremors   Tramadol  Nausea And Vomiting   Adhesive [tape] Rash   Neosporin [neomycin-bacitracin Zn-polymyx] Itching, Rash        Medication List     STOP taking these medications    diltiazem  180 MG 24 hr capsule Commonly known as: CARDIZEM  CD       TAKE these medications    albuterol  108 (90 Base) MCG/ACT inhaler Commonly known as: ProAir  HFA Take  2 inhalations  15 minutes apart  every 4 hours  as needed for Wheezing   amiodarone  200 MG tablet Commonly known as: Pacerone  Take 1 tablet (200 mg total) by mouth daily. What changed: when to take this   cholecalciferol  25 MCG (1000 UNIT) tablet Commonly known as: VITAMIN D3 Take 3,000 Units by mouth daily.   diazepam  2 MG tablet Commonly known as: VALIUM  Take  1 tablet 3 - 4 x /day for Tremor                                     /                                 TAKE                    BY                     MOUTH What changed:  how much to take how to take this when to take this reasons to take this additional instructions   furosemide  40 MG tablet Commonly known as: Lasix  Take 1 tablet (40 mg total) by  mouth daily.   polyethylene glycol 17 g packet Commonly known as: MIRALAX  / GLYCOLAX  Take 17 g by mouth daily.   Rivaroxaban  15 MG Tabs tablet Commonly known as: Xarelto  Take 1 tablet (15 mg total) by mouth daily with supper. Start taking on: May 03, 2024 What changed:  See the new instructions. These instructions start on May 03, 2024. If you are unsure what to do until then, ask your doctor or other care provider.   VITAMIN C  PO Take 1 tablet by mouth daily.   ZINC PO Take 1 capsule by mouth daily.       Allergies  Allergen Reactions   Codeine Rash   Accupril [Quinapril Hcl] Cough   Prednisone      Agitated and shaky   Reglan [Metoclopramide] Other (See Comments)    tremors   Tramadol  Nausea And Vomiting   Adhesive [Tape] Rash   Neosporin [Neomycin-Bacitracin Zn-Polymyx] Itching and Rash      The results of significant diagnostics from this hospitalization (including imaging, microbiology, ancillary and laboratory) are listed below for reference.    Significant Diagnostic Studies: CT ANGIO GI BLEED Result Date: 04/25/2024 CLINICAL DATA:  GI bleed EXAM: CTA ABDOMEN AND PELVIS WITHOUT AND WITH CONTRAST TECHNIQUE: Multidetector CT imaging of the abdomen and pelvis was performed using the standard protocol during bolus administration of intravenous contrast. Multiplanar reconstructed images and MIPs were obtained and reviewed to evaluate the vascular anatomy. RADIATION DOSE REDUCTION: This exam was performed according to the departmental dose-optimization program which includes automated exposure control, adjustment of the  mA and/or kV according to patient size and/or use of iterative reconstruction technique. CONTRAST:  OMNIPAQUE  IOHEXOL  350 MG/ML SOLN COMPARISON:  November 20, 2023 FINDINGS: VASCULAR Aorta: Normal caliber aorta without aneurysm, dissection, vasculitis or significant stenosis. Celiac: Patent without evidence of aneurysm, dissection, vasculitis or significant stenosis. SMA: Patent without evidence of aneurysm, dissection, vasculitis or significant stenosis. Renals: Both renal arteries are patent without evidence of aneurysm, dissection, vasculitis, fibromuscular dysplasia or significant stenosis. IMA: Patent without evidence of aneurysm, dissection, vasculitis or significant stenosis. Inflow: Patent without evidence of aneurysm, dissection, vasculitis or significant stenosis. Proximal Outflow: Bilateral common femoral and visualized portions of the superficial and profunda femoral arteries are patent without evidence of aneurysm, dissection, vasculitis or significant stenosis. Veins: No obvious venous abnormality within the limitations of this arterial phase study. Review of the MIP images confirms the above findings. NON-VASCULAR Lower chest: No infiltrates or consolidations, no pleural effusions Hepatobiliary: Liver normal size no masses no biliary dilatation. Gallbladder unremarkable. No gallstones. Pancreas: Pancreas normal size. No masses calcifications or inflammatory changes. Spleen: Spleen normal size.  No masses. Adrenals/Urinary Tract: Adrenal glands are normal size. Follow-up recommended. Kidneys are normal. No masses calcifications or hydronephrosis. Stable large bilateral renal cortical cysts largest in the lower pole of the left kidney measuring 8.8 x 10 cm. Bosniak 1, No follow-up imaging is recommended. JACR 2018 Feb; 264-273, Management of the Incidental Renal Mass on CT, [employer] 2021; 814-848, Bosniak Classification of Cystic Renal Masses, Version 2019. Stomach/Bowel: No small or large  bowel obstruction or inflammatory changes. Diffuse diverticulosis of the colon without diverticulitis No active GI bleed Metallic density about 1.7 cm longitudinal diameter 4 mm in thickness noted within the proximal sigmoid colon could represent foreign body swallowed by the patient. Could represent Moderate amount of residual fecal material throughout the colon without obstruction or constipation. Vascular/Lymphatic: No significant vascular findings are present. No enlarged abdominal or pelvic lymph nodes. Reproductive:  Inhomogeneous uterus with some abnormal enhancement correlate with fibroid changes. Other: Anterior abdominal wall unremarkable without evidence of umbilical or inguinal hernias Musculoskeletal: Visualized portion of the thoracolumbar spine and pelvic structures grossly unremarkable without evidence of fracture bony abnormalities or soft tissue masses. IMPRESSION: VASCULAR No evidence of aortic aneurysm, dissection or significant stenosis. No evidence of active GI bleed. NON-VASCULAR No acute intra-abdominal or pelvic pathology. *Diffuse diverticulosis of the colon without diverticulitis. *Metallic density noted within the proximal sigmoid colon could represent foreign body swallowed by the patient. Could represent Moderate amount of residual fecal material throughout the colon without obstruction or constipation. *Stable large bilateral renal cortical cysts largest in the lower pole of the left kidney measuring 8.8 x 10 cm. Bosniak 1, No follow-up imaging is recommended. NON-VASCULAR Electronically Signed   By: Fredrich Jefferson M.D.   On: 04/25/2024 10:56   EP STUDY Result Date: 03/29/2024 See surgical note for result.   Microbiology: Recent Results (from the past 240 hours)  Urine Culture     Status: Abnormal   Collection Time: 04/24/24  9:40 AM   Specimen: Urine, Random  Result Value Ref Range Status   Specimen Description URINE, RANDOM  Final   Special Requests   Final    NONE  Reflexed from [id number] Performed at [facility], [address] [place], [place] [address]    Culture >=100,000 COLONIES/mL ESCHERICHIA COLI (Bula Cavalieri)  Final   Report Status 04/26/2024 FINAL  Final   Organism ID, Bacteria ESCHERICHIA COLI (Teja Costen)  Final      Susceptibility   Escherichia coli - MIC*    AMPICILLIN >=32 RESISTANT Resistant     CEFAZOLIN  <=4 SENSITIVE Sensitive     CEFEPIME <=0.12 SENSITIVE Sensitive     CEFTRIAXONE  <=0.25 SENSITIVE Sensitive     CIPROFLOXACIN <=0.25 SENSITIVE Sensitive     GENTAMICIN <=1 SENSITIVE Sensitive     IMIPENEM <=0.25 SENSITIVE Sensitive     NITROFURANTOIN  <=16 SENSITIVE Sensitive     TRIMETH/SULFA <=20 SENSITIVE Sensitive     AMPICILLIN/SULBACTAM >=32 RESISTANT Resistant     PIP/TAZO <=4 SENSITIVE Sensitive ug/mL    * >=100,000 COLONIES/mL ESCHERICHIA COLI     Labs: Basic Metabolic Panel: Recent Labs  Lab 04/24/24 1007 04/25/24 0310  NA 139 138  K 4.5 4.1  CL 105 105  CO2 26 23  GLUCOSE 187* 161*  BUN 30* 24*  CREATININE 1.35* 1.18*  CALCIUM 9.7 9.3   Liver Function Tests: Recent Labs  Lab 04/24/24 1007 04/25/24 0310  AST 22 17  ALT 19 17  ALKPHOS 80 71  BILITOT 0.7 0.8  PROT 6.6 5.8*  ALBUMIN  3.9 3.5   Recent Labs  Lab 04/24/24 1007  LIPASE 27   No results for input(s): AMMONIA in the last 168 hours. CBC: Recent Labs  Lab 04/24/24 1007 04/24/24 1823 04/25/24 0310 04/25/24 1401 04/25/24 1856 04/26/24 0041 04/26/24 0741  WBC 6.7 6.3 9.0  --   --   --   --   NEUTROABS 5.0  --   --   --   --   --   --   HGB 13.0 11.8* 11.5* 11.5* 11.1* 11.5* 11.0*  HCT 42.5 37.6 36.4 35.8* 35.0* 36.8 35.0*  MCV 93.2 91.7 91.5  --   --   --   --   PLT 216 210 218  --   --   --   --    Cardiac Enzymes: No results for input(s): CKTOTAL, CKMB, CKMBINDEX, TROPONINI  in the last 168 hours. BNP: BNP (last 3 results) No results for input(s): BNP in the last 8760 hours.  ProBNP (last 3 results) No results for input(s):  PROBNP in the last 8760 hours.  CBG: No results for input(s): GLUCAP in the last 168 hours.     Signed:  Donnetta Gains MD.  [facility] 04/26/2024, 6:01 PM

## 2024-04-26 NOTE — Plan of Care (Signed)

## 2024-04-27 ENCOUNTER — Telehealth: Payer: Self-pay

## 2024-04-27 NOTE — Progress Notes (Signed)
 Acute Office Visit  Subjective:     Patient ID: Briana [name], female    DOB: [birth year], 89 y.o.   MRN: [id number]  Chief Complaint  Patient presents with   Hospitalization Follow-up    Discharged on 6/11... Pt states feeling very weak, dizziness and shortness of breathe and headaches x 2 days Bp has been low since getting home.    HPI Follow up Hospitalization  Patient was admitted to [facility] hospital on 04/24/24 and discharged on 04/26/24. She was treated for GI bleed. Treatment for this included holding xarelto . Telephone follow up was done on 04/27/24 She reports good compliance with treatment. She reports this condition is improved. Denies blood in stool today, hematemesis.  She is accompanied by her daughter who is supplementing history. Reports that patient has been feeling weak, dizzy, having some shortness of breath, headaches and lower blood pressures since being released from the hospital.  Has appointment with A-fib clinic on 05/10/2024. Has discontinued diltiazem  and Xarelto  ----------------------------------------------------------------------------------------- [facility] Course: Lower GI Bleed Acute Blood Loss Anemia Suspected Diverticular Bleed Hb relatively stable this AM (down to 11 from 13 on admission) GI recommending hold anticoagulation x 7 days and discuss with cardiology long term anticoagulation risk/benefits   Atrial Fibrillation S/p cardioversion 5/14 - ideally would have continued anticoagulation uninterrupted 4-6 weeks after this, but with bleeding above, currently on hold.  Will hold anticoagulation for 1 week, then resume xarelto .  Long term, she needs to have additional discussions regarding risks/benefits of anticoagulation and whether to continue xarelto  long term.  Continue amiodarone , stop diltiazem  (sinus in 60's without dilt) Repeat echo pending at the time of discharge   Hypertension Stop diltiazem  given HR in 60's   CKD IIIb Noted,  appears at baseline   Tremors Prn valium     E. Coli UTI Asymptomatic   ROS Per HPI      Objective:    BP 110/60 (BP Location: Left Arm, Patient Position: Sitting)   Pulse 94   Temp 98.6 F (37 C) (Temporal)   Ht 5' (1.524 m)   Wt 126 lb (57.2 kg)   SpO2 94%   BMI 24.61 kg/m    Physical Exam Vitals and nursing note reviewed.  Constitutional:      General: She is not in acute distress.    Appearance: She is normal weight.     Comments: Elderly, appears fatigued  HENT:     Head: Normocephalic and atraumatic.     Right Ear: External ear normal.     Left Ear: External ear normal.     Nose: Nose normal.     Mouth/Throat:     Mouth: Mucous membranes are moist.     Pharynx: Oropharynx is clear.   Eyes:     Extraocular Movements: Extraocular movements intact.   Neck:     Vascular: No carotid bruit.   Cardiovascular:     Rate and Rhythm: Normal rate. Rhythm irregular.     Pulses: Normal pulses.     Heart sounds: Normal heart sounds.  Pulmonary:     Effort: Pulmonary effort is normal. No respiratory distress.     Breath sounds: Normal breath sounds. No wheezing, rhonchi or rales.   Musculoskeletal:     Cervical back: Normal range of motion.     Right lower leg: No edema.     Left lower leg: No edema.     Comments: Using single-point cane, generalized weakness, gait instability  Lymphadenopathy:     Cervical:  No cervical adenopathy.   Skin:    Coloration: Skin is pale.   Neurological:     General: No focal deficit present.     Mental Status: She is alert and oriented to person, place, and time.   Psychiatric:        Mood and Affect: Mood normal.        Thought Content: Thought content normal.    No results found for any visits on 04/28/24.      Assessment & Plan:   Persistent atrial fibrillation (HCC) -     CBC with Differential/Platelet -     Comprehensive metabolic panel with GFR -     Ambulatory referral to  Gastroenterology  Hypercoagulable state due to persistent atrial fibrillation (HCC) -     CBC with Differential/Platelet -     Comprehensive metabolic panel with GFR -     Ambulatory referral to Gastroenterology  Chronic diastolic heart failure (HCC) -     CBC with Differential/Platelet -     Comprehensive metabolic panel with GFR  Type 2 diabetes mellitus with hyperlipidemia (HCC) -     Comprehensive metabolic panel with GFR  Gastrointestinal hemorrhage, unspecified gastrointestinal hemorrhage type -     CBC with Differential/Platelet -     Comprehensive metabolic panel with GFR  Diverticular hemorrhage -     Ambulatory referral to Gastroenterology  Other orders -     Amiodarone  HCl; Take 1 tablet (200 mg total) by mouth daily.  Dispense: 90 tablet; Refill: 2     Meds ordered this encounter  Medications   amiodarone  (PACERONE ) 200 MG tablet    Sig: Take 1 tablet (200 mg total) by mouth daily.    Dispense:  90 tablet    Refill:  2    Updated prescription    Return if symptoms worsen or fail to improve.  [name], FNP

## 2024-04-27 NOTE — Transitions of Care (Post Inpatient/ED Visit) (Signed)
   04/27/2024  Name: Briana [name] MRN: [id number] DOB: [birth year]  Today's TOC FU Call Status: Today's TOC FU Call Status:: Successful TOC FU Call Completed TOC FU Call Complete Date: 04/27/24 Patient's Name and Date of Birth confirmed.  Transition Care Management Follow-up Telephone Call Date of Discharge: 04/26/24 Discharge Facility: [facility]) Type of Discharge: Inpatient Admission Primary Inpatient Discharge Diagnosis:: GI bleed How have you been since you were released from the hospital?: Better Any questions or concerns?: No  Items Reviewed: Did you receive and understand the discharge instructions provided?: Yes Medications obtained,verified, and reconciled?: Yes (Medications Reviewed) Any new allergies since your discharge?: No Dietary orders reviewed?: Yes Do you have support at home?: Yes People in Home [RPT]: child(ren), adult  Medications Reviewed Today: Medications Reviewed Today   Medications were not reviewed in this encounter     Home Care and Equipment/Supplies: Were Home Health Services Ordered?: NA Any new equipment or medical supplies ordered?: NA  Functional Questionnaire: Do you need assistance with bathing/showering or dressing?: No Do you need assistance with meal preparation?: No Do you need assistance with eating?: No Do you have difficulty maintaining continence: No Do you need assistance with getting out of bed/getting out of a chair/moving?: No Do you have difficulty managing or taking your medications?: No  Follow up appointments reviewed: PCP Follow-up appointment confirmed?: Yes Date of PCP follow-up appointment?: 04/28/24 Follow-up Provider: [facility] Follow-up appointment confirmed?: NA Do you need transportation to your follow-up appointment?: No Do you understand care options if your condition(s) worsen?: Yes-patient verbalized understanding    SIGNATURE Darrall Ellison, LPN [facility] Nurse Health  Advisor Direct Dial [phone]

## 2024-04-28 ENCOUNTER — Ambulatory Visit: Payer: Self-pay | Admitting: Family Medicine

## 2024-04-28 ENCOUNTER — Ambulatory Visit: Admitting: Family Medicine

## 2024-04-28 ENCOUNTER — Encounter: Payer: Self-pay | Admitting: Family Medicine

## 2024-04-28 VITALS — BP 110/60 | HR 94 | Temp 98.6°F | Ht 60.0 in | Wt 126.0 lb

## 2024-04-28 DIAGNOSIS — K5731 Diverticulosis of large intestine without perforation or abscess with bleeding: Secondary | ICD-10-CM

## 2024-04-28 DIAGNOSIS — E1169 Type 2 diabetes mellitus with other specified complication: Secondary | ICD-10-CM

## 2024-04-28 DIAGNOSIS — I5032 Chronic diastolic (congestive) heart failure: Secondary | ICD-10-CM | POA: Diagnosis not present

## 2024-04-28 DIAGNOSIS — D6869 Other thrombophilia: Secondary | ICD-10-CM | POA: Diagnosis not present

## 2024-04-28 DIAGNOSIS — E785 Hyperlipidemia, unspecified: Secondary | ICD-10-CM

## 2024-04-28 DIAGNOSIS — I4819 Other persistent atrial fibrillation: Secondary | ICD-10-CM | POA: Diagnosis not present

## 2024-04-28 DIAGNOSIS — K922 Gastrointestinal hemorrhage, unspecified: Secondary | ICD-10-CM

## 2024-04-28 LAB — COMPREHENSIVE METABOLIC PANEL WITH GFR
ALT: 15 U/L (ref 0–35)
AST: 20 U/L (ref 0–37)
Albumin: 4.4 g/dL (ref 3.5–5.2)
Alkaline Phosphatase: 92 U/L (ref 39–117)
BUN: 31 mg/dL — ABNORMAL HIGH (ref 6–23)
CO2: 27 meq/L (ref 19–32)
Calcium: 10.1 mg/dL (ref 8.4–10.5)
Chloride: 100 meq/L (ref 96–112)
Creatinine, Ser: 1.62 mg/dL — ABNORMAL HIGH (ref 0.40–1.20)
GFR: 28.21 mL/min — ABNORMAL LOW (ref >60.00)
Glucose, Bld: 133 mg/dL — ABNORMAL HIGH (ref 70–99)
Potassium: 4.1 meq/L (ref 3.5–5.1)
Sodium: 136 meq/L (ref 135–145)
Total Bilirubin: 0.7 mg/dL (ref 0.2–1.2)
Total Protein: 6.9 g/dL (ref 6.0–8.3)

## 2024-04-28 LAB — CBC WITH DIFFERENTIAL/PLATELET
Basophils Absolute: 0 10*3/uL (ref 0.0–0.1)
Basophils Relative: 0.4 % (ref 0.0–3.0)
Eosinophils Absolute: 0.1 10*3/uL (ref 0.0–0.7)
Eosinophils Relative: 1 % (ref 0.0–5.0)
HCT: 39.5 % (ref 36.0–46.0)
Hemoglobin: 12.8 g/dL (ref 12.0–15.0)
Lymphocytes Relative: 12.9 % (ref 12.0–46.0)
Lymphs Abs: 1 10*3/uL (ref 0.7–4.0)
MCHC: 32.5 g/dL (ref 30.0–36.0)
MCV: 88.2 fl (ref 78.0–100.0)
Monocytes Absolute: 0.8 10*3/uL (ref 0.1–1.0)
Monocytes Relative: 10.4 % (ref 3.0–12.0)
Neutro Abs: 5.7 10*3/uL (ref 1.4–7.7)
Neutrophils Relative %: 75.3 % (ref 43.0–77.0)
Platelets: 248 10*3/uL (ref 150.0–400.0)
RBC: 4.48 Mil/uL (ref 3.87–5.11)
RDW: 18.5 % — ABNORMAL HIGH (ref 11.5–15.5)
WBC: 7.6 10*3/uL (ref 4.0–10.5)

## 2024-04-28 MED ORDER — AMIODARONE HCL 200 MG PO TABS: 200.0000 mg | ORAL_TABLET | Freq: Every day | ORAL | 2 refills | Status: AC

## 2024-04-28 NOTE — Patient Instructions (Addendum)
 Continue to hold Xarelto  unless otherwise directed by A-fib clinic  We are going to continue to hold the diltiazem  until I get labs back.  If we restart it, I will restart at a lower dose and we will call you with results once I have them.  Follow up with the A fib clinic as scheduled.

## 2024-05-10 ENCOUNTER — Ambulatory Visit (HOSPITAL_COMMUNITY)
Admit: 2024-05-10 | Discharge: 2024-05-10 | Disposition: A | Discharge status: Home / Self Care | Attending: Physician Assistant | Admitting: Physician Assistant

## 2024-05-10 ENCOUNTER — Telehealth: Payer: Self-pay

## 2024-05-10 VITALS — BP 116/58 | HR 61 | Ht 60.0 in | Wt 132.0 lb

## 2024-05-10 DIAGNOSIS — I4891 Unspecified atrial fibrillation: Secondary | ICD-10-CM

## 2024-05-10 DIAGNOSIS — Z5181 Encounter for therapeutic drug level monitoring: Secondary | ICD-10-CM | POA: Diagnosis not present

## 2024-05-10 DIAGNOSIS — Z79899 Other long term (current) drug therapy: Secondary | ICD-10-CM

## 2024-05-10 DIAGNOSIS — D6869 Other thrombophilia: Secondary | ICD-10-CM | POA: Diagnosis not present

## 2024-05-10 DIAGNOSIS — I4819 Other persistent atrial fibrillation: Secondary | ICD-10-CM

## 2024-05-10 NOTE — Progress Notes (Signed)
 Primary Care Physician: [name], FNP Primary Cardiologist: Dr Burnard Primary Electrophysiologist: none Referring Physician: Dr Burnard Briana [name] is a 89 y.o. female with a history of HTN, HLD, DM, chronic HFpEF, atrial fibrillation who presents for follow up in the [facility].  The patient was initially diagnosed with atrial fibrillation in 2020 and underwent DCCV x 2. She has been maintained on amiodarone . Patient is on Xarelto  for stroke prevention. She was seen by Dr Burnard on 03/19/23 and found to be in rate controlled atrial flutter. Her amiodarone  was increased and she was referred to the AF clinic. Her Eliquis  was changed to Xarelto  since her Cr was vacillating around 1.5. S/p DCCV on 05/17/23. She was seen by Dr Burnard on 03/21/24 and found to be back in atrial flutter. She is s/p DCCV on 03/29/24.   Patient was admitted after presenting to the ED with GI bleeding 04/24/24. Her Xarelto  was held but she did not require transfusion. CT angio showed no evidence of active GI bleed. GI consulted who recommended holding Xarelto  for at least one week. She remains off anticoagulation at this time. Her diltiazem  was stopped during her hospitalization due to bradycardia.   Patient returns for follow up for atrial fibrillation. She remains in SR today. She has not resumed his Xarelto . No further bleeding issues since discharge.   Today, she  denies symptoms of palpitations, chest pain, shortness of breath, orthopnea, PND, lower extremity edema, dizziness, presyncope, syncope, snoring, daytime somnolence, or neurologic sequela. The patient is tolerating medications without difficulties and is otherwise without complaint today.    Atrial Fibrillation Risk Factors:  she does not have symptoms or diagnosis of sleep apnea. she does not have a history of rheumatic fever. she does not have a history of alcohol  use. The patient does not have a history of early  familial atrial fibrillation or other arrhythmias.   Atrial Fibrillation Management history:  Previous antiarrhythmic drugs: amiodarone   Previous cardioversions: 2020 x 2, 05/17/23 Previous ablations: none Anticoagulation history: Eliquis , Xarelto     Past Medical History:  Diagnosis Date   Acute on chronic diastolic (congestive) heart failure (HCC) 11/21/2018   Cancer (HCC)    skin cancer   DDD (degenerative disc disease)    Diverticula of colon    Diverticulitis    DJD (degenerative joint disease)    GERD (gastroesophageal reflux disease)    takes ranitidine   Heart murmur    History of hiatal hernia    History of pneumonia    Hx of irritable bowel syndrome    Hyperlipidemia    Hypertension    Occasional tremors    Osteoporosis    Pre-diabetes    Varicose veins    Vitamin D  deficiency     ROS- All systems are reviewed and negative except as per the HPI above.  Physical Exam: Vitals:   05/10/24 1345  BP: (!) 116/58  Pulse: 61  Weight: 59.9 kg  Height: 5' (1.524 m)    GEN: Well nourished, well developed in no acute distress CARDIAC: Regular rate and rhythm, no murmurs, rubs, gallops RESPIRATORY:  Clear to auscultation without rales, wheezing or rhonchi  ABDOMEN: Soft, non-tender, non-distended EXTREMITIES:  No edema; No deformity    Wt Readings from Last 3 Encounters:  05/10/24 59.9 kg  04/28/24 57.2 kg  04/24/24 58.5 kg    EKG today demonstrates  SR, 1st degree AV block, inc RBBB Vent. rate 61 BPM PR  interval 218 ms QRS duration 116 ms QT/QTcB 482/485 ms   Echo 04/08/23 demonstrated  1. Atrial flutter controlled rate. Left ventricular ejection fraction, by estimation, is 60 to 65%. The left ventricle has normal function. The left ventricle has no regional wall motion abnormalities. There is mild concentric left ventricular hypertrophy. Left ventricular diastolic  parameters are indeterminate.   2. Right ventricular systolic function is mildly reduced.  The right  ventricular size is normal. There is normal pulmonary artery systolic  pressure.   3. Left atrial size was severely dilated.   4. The mitral valve is normal in structure. Mild mitral valve  regurgitation. No evidence of mitral stenosis.   5. The aortic valve is tricuspid. There is mild thickening of the aortic  valve. Aortic valve regurgitation is mild. No aortic stenosis is present.   6. Aortic Normal DTA.   7. The inferior vena cava is normal in size with greater than 50%  respiratory variability, suggesting right atrial pressure of 3 mmHg.   Comparison(s): EF 55%, mild LVH, mild-mod MR, RVSP 53.4 mmHg.   Epic records are reviewed at length today.  CHA2DS2-VASc Score = 6  The patient's score is based upon: CHF History: 1 HTN History: 1 Diabetes History: 1 Stroke History: 0 Vascular Disease History: 0 Age Score: 2 Gender Score: 1       ASSESSMENT AND PLAN: Persistent Atrial Fibrillation/atrial flutter The patient's CHA2DS2-VASc score is 6, indicating a 9.7% annual risk of stroke.   S/p DCCV 03/29/24 Patient appears to be maintaining SR Continue amiodarone  200 mg daily  Secondary Hypercoagulable State (ICD10:  D68.69) The patient is at significant risk for stroke/thromboembolism based upon her CHA2DS2-VASc Score of 6.  However, the patient is not on anticoagulation due to her high bleeding risk. She has had 3 GI bleeds. Will refer her to discuss Watchman with Dr Cindie or Dr Kennyth. We discussed that her advanced age may limit her ability to have the procedure. If she is not a candidate for Watchman, may have to accept risk of stroke off anticoagulation.   High Risk Medication Monitoring (ICD 10: Z79.899) Intervals on ECG acceptable for amiodarone  monitoring. Recent labs reviewed.   Chronic HFpEF EF 60-65% GDMT per primary cardiology team Fluid status appears stable today  HTN Stable on current regimen Will not resume diltiazem  at this time.     Follow  up with EP to discuss Watchman.     Daril Kicks PA-C Afib Clinic [facility] [address] [place], [place] [address] [phone] 05/10/2024 2:04 PM

## 2024-05-10 NOTE — Telephone Encounter (Signed)
 Per Daril Kicks, called to arrange [facility] consult.  Spoke with the patient's daughter who stated she would talk with her sisters for availability to bring to the appointment and she would call back this week to arrange. She was grateful for call.

## 2024-05-15 ENCOUNTER — Ambulatory Visit (INDEPENDENT_AMBULATORY_CARE_PROVIDER_SITE_OTHER)

## 2024-05-15 VITALS — Ht 60.0 in | Wt 132.0 lb

## 2024-05-15 DIAGNOSIS — M85831 Other specified disorders of bone density and structure, right forearm: Secondary | ICD-10-CM

## 2024-05-15 DIAGNOSIS — Z0000 Encounter for general adult medical examination without abnormal findings: Secondary | ICD-10-CM

## 2024-05-15 NOTE — Patient Instructions (Signed)
 Briana [name] , Thank you for taking time out of your busy schedule to complete your Annual Wellness Visit with me. I enjoyed our conversation and look forward to speaking with you again next year. I, as well as your care team,  appreciate your ongoing commitment to your health goals. Please review the following plan we discussed and let me know if I can assist you in the future. Your Game plan/ To Do List    Referrals: If you haven't heard from the office you've been referred to, please reach out to them at the phone provided.  You have an order for:  [x]   Bone Density     Please call for appointment:  [facility] [address] [place], [place] [address] [phone]  Make sure to wear two-piece clothing.  No lotions, powders, or deodorants the day of the appointment. Make sure to bring picture ID and insurance card.  Bring list of medications you are currently taking including any supplements.   Follow up Visits: Next Medicare AWV with our clinical staff: 05/16/2025.   Have you seen your provider in the last 6 months (3 months if uncontrolled diabetes)? Yes Next Office Visit with your provider: 06/06/2024.  Clinician Recommendations:  Aim for 30 minutes of exercise or brisk walking, 6-8 glasses of water , and 5 servings of fruits and vegetables each day. Keep up the good work.        This is a list of the screening recommended for you and due dates:  Health Maintenance  Topic Date Due   COVID-19 Vaccine (1) Never done   Zoster (Shingles) Vaccine (1 of 2) 11/29/1952   Eye exam for diabetics  02/10/2024   Complete foot exam   03/10/2024   Medicare Annual Wellness Visit  06/13/2024   Flu Shot  06/16/2024   Hemoglobin A1C  08/24/2024   DTaP/Tdap/Td vaccine (3 - Tdap) 09/04/2025   Pneumococcal Vaccine for age over 28  Completed   DEXA scan (bone density measurement)  Completed   Hepatitis B Vaccine  Aged Out   HPV Vaccine  Aged Out   Meningitis B Vaccine  Aged  Out    Advanced directives: (Declined) Advance directive discussed with you today. Even though you declined this today, please call our office should you change your mind, and we can give you the proper paperwork for you to fill out. Advance Care Planning is important because it:  [x]  Makes sure you receive the medical care that is consistent with your values, goals, and preferences  [x]  It provides guidance to your family and loved ones and reduces their decisional burden about whether or not they are making the right decisions based on your wishes.  Follow the link provided in your after visit summary or read over the paperwork we have mailed to you to help you started getting your Advance Directives in place. If you need assistance in completing these, please reach out to us  so that we can help you!  See attachments for Preventive Care and Fall Prevention Tips.

## 2024-05-15 NOTE — Progress Notes (Signed)
 Subjective:   Briana [name] is a 89 y.o. who presents for a Medicare Wellness preventive visit.  As a reminder, Annual Wellness Visits don't include a physical exam, and some assessments may be limited, especially if this visit is performed virtually. We may recommend an in-person follow-up visit with your provider if needed.  Visit Complete: Virtual I connected with  Briana [name] on 05/15/24 by a audio enabled telemedicine application and verified that I am speaking with the correct person using two identifiers.  Patient Location: Home  Provider Location: Office/Clinic  I discussed the limitations of evaluation and management by telemedicine. The patient expressed understanding and agreed to proceed.  Vital Signs: Because this visit was a virtual/telehealth visit, some criteria may be missing or patient reported. Any vitals not documented were not able to be obtained and vitals that have been documented are patient reported.  VideoDeclined- This patient declined Librarian, academic. Therefore the visit was completed with audio only.  Persons Participating in Visit: Patient.  AWV Questionnaire: No: Patient Medicare AWV questionnaire was not completed prior to this visit.  Cardiac Risk Factors include: advanced age (>46men, >70 women);hypertension;diabetes mellitus;dyslipidemia;Other (see comment)     Objective:    Today's Vitals   05/15/24 1526  Weight: 132 lb (59.9 kg)  Height: 5' (1.524 m)   Body mass index is 25.78 kg/m.     05/15/2024    3:49 PM 04/24/2024    1:07 PM 04/24/2024    8:46 AM 11/21/2023    3:57 PM 11/20/2023    5:00 PM 06/14/2023   10:54 AM 09/15/2022    6:20 PM  Advanced Directives  Does Patient Have a Medical Advance Directive? No  No  No No No  Would patient like information on creating a medical advance directive?  No - Patient declined  No - Patient declined  No - Patient declined No - Patient declined    Current Medications  (verified) Outpatient Encounter Medications as of 05/15/2024  Medication Sig   albuterol  (PROAIR  HFA) 108 (90 Base) MCG/ACT inhaler Take  2 inhalations  15 minutes apart  every 4 hours  as needed for Wheezing   amiodarone  (PACERONE ) 200 MG tablet Take 1 tablet (200 mg total) by mouth daily.   Ascorbic Acid  (VITAMIN C  PO) Take 1 tablet by mouth daily.   augmented betamethasone  dipropionate (DIPROLENE -AF) 0.05 % cream Apply topically 2 (two) times daily.   cholecalciferol  (VITAMIN D3) 25 MCG (1000 UNIT) tablet Take 3,000 Units by mouth daily.   furosemide  (LASIX ) 40 MG tablet Take 1 tablet (40 mg total) by mouth daily.   loratadine (CLARITIN) 10 MG tablet Take 10 mg by mouth as needed for allergies.   Multiple Vitamins-Minerals (ZINC PO) Take 1 capsule by mouth daily.   polyethylene glycol (MIRALAX  / GLYCOLAX ) 17 g packet Take 17 g by mouth daily. (Patient taking differently: Take 17 g by mouth as needed.)   diazepam  (VALIUM ) 2 MG tablet Take 1 tablet 3 - 4 x /day for Tremor                                     /                                 TAKE  BY                     MOUTH (Patient not taking: Reported on 05/15/2024)   Rivaroxaban  (XARELTO ) 15 MG TABS tablet Take 1 tablet (15 mg total) by mouth daily with supper. (Patient not taking: Reported on 05/15/2024)   No facility-administered encounter medications on file as of 05/15/2024.    Allergies (verified) Codeine, Accupril [quinapril hcl], Prednisone , Reglan [metoclopramide], Tramadol , Adhesive [tape], and Neosporin [neomycin-bacitracin zn-polymyx]   History: Past Medical History:  Diagnosis Date   Acute on chronic diastolic (congestive) heart failure (HCC) 11/21/2018   Cancer (HCC)    skin cancer   DDD (degenerative disc disease)    Diverticula of colon    Diverticulitis    DJD (degenerative joint disease)    GERD (gastroesophageal reflux disease)    takes ranitidine   Heart murmur    History of hiatal hernia     History of pneumonia    Hx of irritable bowel syndrome    Hyperlipidemia    Hypertension    Occasional tremors    Osteoporosis    Pre-diabetes    Varicose veins    Vitamin D  deficiency    Past Surgical History:  Procedure Laterality Date   APPENDECTOMY     BREAST EXCISIONAL BIOPSY Right    BREAST SURGERY Right    fluid drained from breast   CARDIOVERSION N/A 12/20/2018   Procedure: CARDIOVERSION;  Surgeon: Lonni Slain, MD;  Location: [facility] ENDOSCOPY;  Service: Cardiovascular;  Laterality: N/A;   CARDIOVERSION N/A 06/16/2019   Procedure: CARDIOVERSION;  Surgeon: Okey Vina GAILS, MD;  Location: [facility] ENDOSCOPY;  Service: Cardiovascular;  Laterality: N/A;   CARDIOVERSION N/A 05/17/2023   Procedure: CARDIOVERSION;  Surgeon: Okey Vina GAILS, MD;  Location: [facility] INVASIVE CV LAB;  Service: Cardiovascular;  Laterality: N/A;   CARDIOVERSION N/A 03/29/2024   Procedure: CARDIOVERSION;  Surgeon: Lonni Slain, MD;  Location: [facility] INVASIVE CV LAB;  Service: Cardiovascular;  Laterality: N/A;   COLONOSCOPY     ESOPHAGOGASTRODUODENOSCOPY     FIDUCIAL MARKER PLACEMENT  11/20/2023   Procedure: CLIP MARKER PLACEMENT;  Surgeon: Rollin Dover, MD;  Location: [facility] ENDOSCOPY;  Service: Gastroenterology;;   ENID SIGMOIDOSCOPY N/A 03/09/2021   Procedure: FLEXIBLE SIGMOIDOSCOPY;  Surgeon: Shila Gustav GAILS, MD;  Location: [facility] ENDOSCOPY;  Service: Endoscopy;  Laterality: N/A;   FLEXIBLE SIGMOIDOSCOPY N/A 11/20/2023   Procedure: FLEXIBLE SIGMOIDOSCOPY;  Surgeon: Rollin Dover, MD;  Location: [facility] ENDOSCOPY;  Service: Gastroenterology;  Laterality: N/A;   HEMOSTASIS CLIP PLACEMENT  03/09/2021   Procedure: HEMOSTASIS CLIP PLACEMENT;  Surgeon: Shila Gustav GAILS, MD;  Location: [facility] ENDOSCOPY;  Service: Endoscopy;;   JOINT REPLACEMENT Left    left hip   LUMBAR LAMINECTOMY/DECOMPRESSION MICRODISCECTOMY N/A 06/05/2015   Procedure: L3-L5 DECOMPRESSION ;  Surgeon: Donaciano Sprang, MD;  Location: [facility] OR;  Service:  Orthopedics;  Laterality: N/A;   OPEN REDUCTION INTERNAL FIXATION (ORIF) DISTAL RADIAL FRACTURE Right 01/10/2014   Procedure: OPEN REDUCTION INTERNAL FIXATION (ORIF) DISTAL RADIAL FRACTURE;  Surgeon: Prentice LELON Pagan, MD;  Location: [facility] OR;  Service: Orthopedics;  Laterality: Right;   SPINAL CORD DECOMPRESSION  06/05/2015   L3 L 5   THYROID  SURGERY     TONSILLECTOMY     TUBAL LIGATION     varicose veins stripped     WRIST FRACTURE SURGERY Bilateral    Family History  Problem Relation Age of Onset   Hypertension Mother    Hyperlipidemia Mother    Heart disease  Sister    Cancer Brother        prostate   Colon cancer Neg Hx    Esophageal cancer Neg Hx    Pancreatic cancer Neg Hx    Stomach cancer Neg Hx    Liver disease Neg Hx    Breast cancer Neg Hx    Social History   Socioeconomic History   Marital status: Widowed    Spouse name: Not on file   Number of children: Not on file   Years of education: Not on file   Highest education level: Not on file  Occupational History   Occupation: RETIRED  Tobacco Use   Smoking status: Never   Smokeless tobacco: Never   Tobacco comments:    Never smoked 09/13/23  Vaping Use   Vaping status: Never Used  Substance and Sexual Activity   Alcohol  use: No   Drug use: No   Sexual activity: Not on file  Other Topics Concern   Not on file  Social History Narrative   Lives alone/2025   Social Drivers of Health   Financial Resource Strain: Low Risk  (05/15/2024)   Overall Financial Resource Strain (CARDIA)    Difficulty of Paying Living Expenses: Not hard at all  Food Insecurity: No Food Insecurity (05/15/2024)   Hunger Vital Sign    Worried About Running Out of Food in the Last Year: Never true    Ran Out of Food in the Last Year: Never true  Transportation Needs: No Transportation Needs (05/15/2024)   PRAPARE - Administrator, Civil Service (Medical): No    Lack of Transportation (Non-Medical): No  Physical Activity:  Sufficiently Active (05/15/2024)   Exercise Vital Sign    Days of Exercise per Week: 3 days    Minutes of Exercise per Session: 60 min  Stress: No Stress Concern Present (05/15/2024)   [employer] of Occupational Health - Occupational Stress Questionnaire    Feeling of Stress: Not at all  Social Connections: Moderately Integrated (05/15/2024)   Social Connection and Isolation Panel    Frequency of Communication with Friends and Family: Twice a week    Frequency of Social Gatherings with Friends and Family: More than three times a week    Attends Religious Services: More than 4 times per year    Active Member of [employer] or Organizations: Yes    Attends Banker Meetings: Never    Marital Status: Widowed    Tobacco Counseling Counseling given: Not Answered Tobacco comments: Never smoked 09/13/23    Clinical Intake:  Pre-visit preparation completed: Yes  Pain : No/denies pain     BMI - recorded: 25.78 Nutritional Status: BMI 25 -29 Overweight Diabetes: Yes CBG done?: No Did pt. bring in CBG monitor from home?: No  Lab Results  Component Value Date   HGBA1C 6.9 (H) 02/23/2024   HGBA1C 6.5 (H) 09/14/2023   HGBA1C 6.7 (H) 06/14/2023     How often do you need to have someone help you when you read instructions, pamphlets, or other written materials from your doctor or pharmacy?: 1 - Never  Interpreter Needed?: No  Information entered by :: Isabelle Matt, RMA   Activities of Daily Living     05/15/2024    3:43 PM 04/24/2024   12:50 PM  In your present state of health, do you have any difficulty performing the following activities:  Hearing? 1 1  Comment wears hearing aides   Vision? 0 0  Difficulty concentrating or  making decisions? 0 0  Walking or climbing stairs? 0   Dressing or bathing? 0   Doing errands, shopping? 0 0  Comment daughters drive around but pt still drives as well   Preparing Food and eating ? N   Using the Toilet? N   In the past  six months, have you accidently leaked urine? N   Do you have problems with loss of bowel control? N   Managing your Medications? N   Managing your Finances? N   Housekeeping or managing your Housekeeping? N     Patient Care Team: [name], FNP as PCP - General (Family Medicine) Shari Easter, MD as Consulting Physician (Orthopedic Surgery) Aneita Gwendlyn DASEN, MD (Inactive) as Consulting Physician (Gastroenterology) Bonner Ade, MD as Consulting Physician (Physical Medicine and Rehabilitation) Cary Doffing, MD as Consulting Physician (Dermatology) Lonni Slain, MD as Consulting Physician (Cardiology) Nathanael Davies, [facility] (Inactive) as Pharmacist (Pharmacist)  I have updated your Care Teams any recent Medical Services you may have received from other providers in the past year.     Assessment:   This is a routine wellness examination for Briana [name].  Hearing/Vision screen Hearing Screening - Comments:: Wears hearing aides Vision Screening - Comments:: Wears eyeglasses/ Dr. Octavia   Goals Addressed   None    Depression Screen     05/15/2024    3:59 PM 04/28/2024    9:49 AM 09/14/2023   12:43 AM 06/14/2023   10:56 AM 03/13/2023   10:08 PM 09/27/2022   11:58 AM 03/08/2022   11:55 PM  PHQ 2/9 Scores  PHQ - 2 Score 0 1 0 0 0 0 0  PHQ- 9 Score 1 6         Fall Risk     05/15/2024    3:50 PM 09/14/2023   12:43 AM 06/14/2023   10:55 AM 03/13/2023   10:08 PM 09/27/2022   11:58 AM  Fall Risk   Falls in the past year? 1 0 1 0 0  Number falls in past yr: 1  0    Injury with Fall? 0  0    Risk for fall due to :  No Fall Risks History of fall(s);Orthopedic patient No Fall Risks No Fall Risks  Follow up Falls evaluation completed;Falls prevention discussed Falls prevention discussed;Education provided;Falls evaluation completed Falls evaluation completed;Falls prevention discussed Education provided;Falls prevention discussed;Falls evaluation completed Education  provided;Falls evaluation completed;Falls prevention discussed      Data saved with a previous flowsheet row definition    MEDICARE RISK AT HOME:  Medicare Risk at Home Any stairs in or around the home?: No If so, are there any without handrails?: No Home free of loose throw rugs in walkways, pet beds, electrical cords, etc?: Yes Adequate lighting in your home to reduce risk of falls?: Yes Life alert?: Yes Use of a cane, walker or w/c?: Yes (cane and walker) Grab bars in the bathroom?: Yes Shower chair or bench in shower?: No Elevated toilet seat or a handicapped toilet?: Yes  TIMED UP AND GO:  Was the test performed?  No  Cognitive Function: 6CIT completed    01/26/2017   12:15 PM  MMSE - Mini Mental State Exam  Orientation to time 5   Orientation to Place 5   Registration 3   Attention/ Calculation 5   Recall 3   Language- name 2 objects 2   Language- repeat 1  Language- follow 3 step command 3   Language- read & follow direction  1   Write a sentence 1   Copy design 1   Total score 30      Data saved with a previous flowsheet row definition        05/15/2024    3:53 PM  6CIT Screen  What Year? 0 points  What month? 0 points  What time? 0 points  Count back from 20 0 points  Months in reverse 0 points  Repeat phrase 0 points  Total Score 0 points    Immunizations Immunization History  Administered Date(s) Administered   DT (Pediatric) 09/05/2015   DTaP 11/16/2004   Influenza Whole 08/30/2013   Influenza, High Dose Seasonal PF 08/28/2015, 08/10/2016, 11/20/2019, 09/10/2020   Pneumococcal Conjugate-13 10/19/2016   Pneumococcal Polysaccharide-23 11/16/2001   Zoster, Live 06/01/2013    Screening Tests Health Maintenance  Topic Date Due   COVID-19 Vaccine (1) Never done   Zoster Vaccines- Shingrix (1 of 2) 11/29/1952   OPHTHALMOLOGY EXAM  02/10/2024   FOOT EXAM  03/10/2024   Medicare Annual Wellness (AWV)  06/13/2024   INFLUENZA VACCINE  06/16/2024    HEMOGLOBIN A1C  08/24/2024   DTaP/Tdap/Td (3 - Tdap) 09/04/2025   Pneumococcal Vaccine: 50+ Years  Completed   DEXA SCAN  Completed   Hepatitis B Vaccines  Aged Out   HPV VACCINES  Aged Out   Meningococcal B Vaccine  Aged Out    Health Maintenance  Health Maintenance Due  Topic Date Due   COVID-19 Vaccine (1) Never done   Zoster Vaccines- Shingrix (1 of 2) 11/29/1952   OPHTHALMOLOGY EXAM  02/10/2024   FOOT EXAM  03/10/2024   Medicare Annual Wellness (AWV)  06/13/2024   Health Maintenance Items Addressed: DEXA ordered, See Nurse Notes at the end of this note  Additional Screening:  Vision Screening: Recommended annual ophthalmology exams for early detection of glaucoma and other disorders of the eye. Would you like a referral to an eye doctor? No    Dental Screening: Recommended annual dental exams for proper oral hygiene  Community Resource Referral / Chronic Care Management: CRR required this visit?  No   CCM required this visit?  No   Plan:    I have personally reviewed and noted the following in the patient's chart:   Medical and social history Use of alcohol , tobacco or illicit drugs  Current medications and supplements including opioid prescriptions. Patient is not currently taking opioid prescriptions. Functional ability and status Nutritional status Physical activity Advanced directives List of other physicians Hospitalizations, surgeries, and ER visits in previous 12 months Vitals Screenings to include cognitive, depression, and falls Referrals and appointments  In addition, I have reviewed and discussed with patient certain preventive protocols, quality metrics, and best practice recommendations. A written personalized care plan for preventive services as well as general preventive health recommendations were provided to patient.   Briana [name], CMA   05/15/2024   After Visit Summary: (MyChart) Due to this being a telephonic visit, the after  visit summary with patients personalized plan was offered to patient via MyChart   Notes: Patient is due for a DEXA and order has been placed today.  She has an appointment coming up in July for a diabetic eye exam with Dr. Octavia. Patient is up to date on all other health maintenance with no concerns to address today.

## 2024-06-02 ENCOUNTER — Encounter: Payer: Self-pay | Admitting: Family Medicine

## 2024-06-05 ENCOUNTER — Ambulatory Visit: Admitting: Internal Medicine

## 2024-06-05 NOTE — Telephone Encounter (Signed)
 The patient is scheduled for consult with Dr. Kennyth on 07/04/2024.

## 2024-06-06 ENCOUNTER — Ambulatory Visit (INDEPENDENT_AMBULATORY_CARE_PROVIDER_SITE_OTHER): Admitting: Family Medicine

## 2024-06-06 ENCOUNTER — Encounter: Payer: Self-pay | Admitting: Family Medicine

## 2024-06-06 ENCOUNTER — Ambulatory Visit: Payer: Self-pay | Admitting: Family Medicine

## 2024-06-06 VITALS — BP 120/76 | HR 54 | Temp 98.5°F | Ht 60.0 in | Wt 132.6 lb

## 2024-06-06 DIAGNOSIS — N2581 Secondary hyperparathyroidism of renal origin: Secondary | ICD-10-CM

## 2024-06-06 DIAGNOSIS — Z79899 Other long term (current) drug therapy: Secondary | ICD-10-CM | POA: Diagnosis not present

## 2024-06-06 DIAGNOSIS — I5032 Chronic diastolic (congestive) heart failure: Secondary | ICD-10-CM | POA: Diagnosis not present

## 2024-06-06 DIAGNOSIS — R300 Dysuria: Secondary | ICD-10-CM | POA: Diagnosis not present

## 2024-06-06 DIAGNOSIS — D6869 Other thrombophilia: Secondary | ICD-10-CM

## 2024-06-06 DIAGNOSIS — E1122 Type 2 diabetes mellitus with diabetic chronic kidney disease: Secondary | ICD-10-CM

## 2024-06-06 DIAGNOSIS — N183 Chronic kidney disease, stage 3 unspecified: Secondary | ICD-10-CM | POA: Diagnosis not present

## 2024-06-06 DIAGNOSIS — I4819 Other persistent atrial fibrillation: Secondary | ICD-10-CM

## 2024-06-06 DIAGNOSIS — N3000 Acute cystitis without hematuria: Secondary | ICD-10-CM

## 2024-06-06 DIAGNOSIS — I10 Essential (primary) hypertension: Secondary | ICD-10-CM | POA: Diagnosis not present

## 2024-06-06 DIAGNOSIS — D692 Other nonthrombocytopenic purpura: Secondary | ICD-10-CM

## 2024-06-06 DIAGNOSIS — E1169 Type 2 diabetes mellitus with other specified complication: Secondary | ICD-10-CM

## 2024-06-06 DIAGNOSIS — E785 Hyperlipidemia, unspecified: Secondary | ICD-10-CM

## 2024-06-06 DIAGNOSIS — K922 Gastrointestinal hemorrhage, unspecified: Secondary | ICD-10-CM

## 2024-06-06 LAB — COMPREHENSIVE METABOLIC PANEL WITH GFR
ALT: 18 U/L (ref 0–35)
AST: 19 U/L (ref 0–37)
Albumin: 4.5 g/dL (ref 3.5–5.2)
Alkaline Phosphatase: 90 U/L (ref 39–117)
BUN: 25 mg/dL — ABNORMAL HIGH (ref 6–23)
CO2: 30 meq/L (ref 19–32)
Calcium: 9.9 mg/dL (ref 8.4–10.5)
Chloride: 103 meq/L (ref 96–112)
Creatinine, Ser: 1.32 mg/dL — ABNORMAL HIGH (ref 0.40–1.20)
GFR: 36.05 mL/min — ABNORMAL LOW (ref >60.00)
Glucose, Bld: 136 mg/dL — ABNORMAL HIGH (ref 70–99)
Potassium: 4.3 meq/L (ref 3.5–5.1)
Sodium: 140 meq/L (ref 135–145)
Total Bilirubin: 0.6 mg/dL (ref 0.2–1.2)
Total Protein: 6.8 g/dL (ref 6.0–8.3)

## 2024-06-06 LAB — CBC WITH DIFFERENTIAL/PLATELET
Basophils Absolute: 0.1 K/uL (ref 0.0–0.1)
Basophils Relative: 0.7 % (ref 0.0–3.0)
Eosinophils Absolute: 0.1 K/uL (ref 0.0–0.7)
Eosinophils Relative: 1.1 % (ref 0.0–5.0)
HCT: 39.1 % (ref 36.0–46.0)
Hemoglobin: 12.8 g/dL (ref 12.0–15.0)
Lymphocytes Relative: 16.4 % (ref 12.0–46.0)
Lymphs Abs: 1.2 K/uL (ref 0.7–4.0)
MCHC: 32.6 g/dL (ref 30.0–36.0)
MCV: 90.8 fl (ref 78.0–100.0)
Monocytes Absolute: 0.8 K/uL (ref 0.1–1.0)
Monocytes Relative: 11 % (ref 3.0–12.0)
Neutro Abs: 5 K/uL (ref 1.4–7.7)
Neutrophils Relative %: 70.8 % (ref 43.0–77.0)
Platelets: 217 K/uL (ref 150.0–400.0)
RBC: 4.31 Mil/uL (ref 3.87–5.11)
RDW: 15.4 % (ref 11.5–15.5)
WBC: 7 K/uL (ref 4.0–10.5)

## 2024-06-06 LAB — LIPID PANEL
Cholesterol: 184 mg/dL (ref 0–200)
HDL: 70 mg/dL (ref >39.00)
LDL Cholesterol: 101 mg/dL — ABNORMAL HIGH (ref 0–99)
NonHDL: 113.58
Total CHOL/HDL Ratio: 3
Triglycerides: 61 mg/dL (ref 0.0–149.0)
VLDL: 12.2 mg/dL (ref 0.0–40.0)

## 2024-06-06 LAB — HEMOGLOBIN A1C: Hgb A1c MFr Bld: 6.8 % — ABNORMAL HIGH (ref 4.6–6.5)

## 2024-06-06 LAB — POCT URINALYSIS DIPSTICK
Bilirubin, UA: NEGATIVE
Blood, UA: NEGATIVE
Glucose, UA: NEGATIVE
Ketones, UA: NEGATIVE
Nitrite, UA: NEGATIVE
Protein, UA: NEGATIVE
Spec Grav, UA: 1.015 (ref 1.010–1.025)
Urobilinogen, UA: 0.2 U/dL
pH, UA: 6 (ref 5.0–8.0)

## 2024-06-06 LAB — TSH: TSH: 1.11 u[IU]/mL (ref 0.35–5.50)

## 2024-06-06 LAB — VITAMIN D 25 HYDROXY (VIT D DEFICIENCY, FRACTURES): VITD: 68.5 ng/mL (ref 30.00–100.00)

## 2024-06-06 LAB — VITAMIN B12: Vitamin B-12: >1500 pg/mL — ABNORMAL HIGH (ref 211–911)

## 2024-06-06 LAB — HM DIABETES EYE EXAM

## 2024-06-06 NOTE — Progress Notes (Signed)
 Established Patient Office Visit  Subjective:     Patient ID: TULANI KIDNEY, female    DOB: 12-04-1933, 89 y.o.   MRN: [id number]  Chief Complaint  Patient presents with   Follow-up    HPI  Discussed the use of AI scribe software for clinical note transcription with the patient, who gave verbal consent to proceed.  History of Present Illness Briana [name] is an 89 year old female with atrial fibrillation and hypertension who presents. She is accompanied by her daughter who is supplementing history  Hypertension - Elevated blood pressure readings at home - Blood pressure readings are normal during clinic visits - Home blood pressure monitor is over ten years old and suspected to be inaccurate  Atrial fibrillation and anticoagulation complications - History of atrial fibrillation - Three gastrointestinal bleeds in the past two years - She is considering a Watchman device  Diuretic use and urinary symptoms - Takes Lasix , resulting in increased frequency of urination - Occasional burning sensation during urination - Uses Vaseline and Elmore's Save for skin irritation from wearing pads - Recalls a helpful cream provided during a hospital stay for burning sensations  Nutritional supplementation and hematologic status - Takes a multivitamin, slow-release iron, and osteobiflex - Hemoglobin level last checked 12.8 - Questions the necessity of continuing iron supplementation  Dietary habits and gastrointestinal sensitivities - Weight is approximately 130 pounds - Avoids sugar and white bread, prefers whole wheat bread - Enjoys fresh tomato sandwiches but is cautious of acid due to history of diverticulitis and bladder sensitivity     ROS Per HPI      Objective:    BP 120/76 (BP Location: Left Arm, Patient Position: Sitting)   Pulse (!) 54   Temp 98.5 F (36.9 C) (Temporal)   Ht 5' (1.524 m)   Wt 132 lb 9.6 oz (60.1 kg)   SpO2 94%   BMI 25.90 kg/m    Physical  Exam Vitals and nursing note reviewed.  Constitutional:      General: She is not in acute distress.    Appearance: Normal appearance.     Comments: Elderly  HENT:     Head: Normocephalic and atraumatic.     Right Ear: External ear normal.     Left Ear: External ear normal.     Nose: Nose normal.     Mouth/Throat:     Mouth: Mucous membranes are moist.     Pharynx: Oropharynx is clear.  Eyes:     Extraocular Movements: Extraocular movements intact.     Pupils: Pupils are equal, round, and reactive to light.  Neck:     Vascular: No carotid bruit.  Cardiovascular:     Rate and Rhythm: Normal rate. Rhythm irregular.     Pulses: Normal pulses.     Heart sounds: Normal heart sounds.  Pulmonary:     Effort: Pulmonary effort is normal. No respiratory distress.     Breath sounds: Normal breath sounds. No wheezing, rhonchi or rales.  Musculoskeletal:     Cervical back: Normal range of motion.     Right lower leg: No edema.     Left lower leg: No edema.     Comments: Using single-point cane  Lymphadenopathy:     Cervical: No cervical adenopathy.  Skin:    General: Skin is warm and dry.     Comments: Senile purpura present  Neurological:     General: No focal deficit present.     Mental Status: She  is alert and oriented to person, place, and time.  Psychiatric:        Mood and Affect: Mood normal.        Thought Content: Thought content normal.     Results for orders placed or performed in visit on 06/06/24  POCT urinalysis dipstick  Result Value Ref Range   Color, UA Yellow    Clarity, UA Cloudy    Glucose, UA Negative Negative   Bilirubin, UA Negative    Ketones, UA Negative    Spec Grav, UA 1.015 1.010 - 1.025   Blood, UA Negative    pH, UA 6.0 5.0 - 8.0   Protein, UA Negative Negative   Urobilinogen, UA 0.2 0.2 or 1.0 E.U./dL   Nitrite, UA Negative    Leukocytes, UA Large (3+) (A) Negative   Appearance Cloudy    Odor      The ASCVD Risk score (Arnett DK, et  al., 2019) failed to calculate for the following reasons:   The 2019 ASCVD risk score is only valid for ages 22 to 57  BP Readings from Last 3 Encounters:  06/06/24 120/76  05/10/24 (!) 116/58  04/28/24 110/60   Wt Readings from Last 3 Encounters:  06/06/24 132 lb 9.6 oz (60.1 kg)  05/15/24 132 lb (59.9 kg)  05/10/24 132 lb (59.9 kg)      Last CBC Lab Results  Component Value Date   WBC 7.6 04/28/2024   HGB 12.8 04/28/2024   HCT 39.5 04/28/2024   MCV 88.2 04/28/2024   MCH 28.9 04/25/2024   RDW 18.5 (H) 04/28/2024   PLT 248.0 04/28/2024   Last metabolic panel Lab Results  Component Value Date   GLUCOSE 133 (H) 04/28/2024   NA 136 04/28/2024   K 4.1 04/28/2024   CL 100 04/28/2024   CO2 27 04/28/2024   BUN 31 (H) 04/28/2024   CREATININE 1.62 (H) 04/28/2024   GFR 28.21 (L) 04/28/2024   CALCIUM 10.1 04/28/2024   PROT 6.9 04/28/2024   ALBUMIN  4.4 04/28/2024   BILITOT 0.7 04/28/2024   ALKPHOS 92 04/28/2024   AST 20 04/28/2024   ALT 15 04/28/2024   ANIONGAP 10 04/25/2024   Last lipids Lab Results  Component Value Date   CHOL 187 09/14/2023   HDL 72 09/14/2023   LDLCALC 101 (H) 09/14/2023   TRIG 55 09/14/2023   CHOLHDL 2.6 09/14/2023   Last hemoglobin A1c Lab Results  Component Value Date   HGBA1C 6.9 (H) 02/23/2024   Last thyroid  functions Lab Results  Component Value Date   TSH 1.462 04/24/2024   Last vitamin D  Lab Results  Component Value Date   VD25OH 74.92 02/23/2024   Last vitamin B12 and Folate Lab Results  Component Value Date   VITAMINB12 1,442 (H) 02/23/2024         Assessment & Plan:   Assessment and Plan Assessment & Plan Persistent Atrial Fibrillation/Hypercoagulable State Persistent atrial fibrillation with consideration of the Watchman device to reduce clot risk in the left atrial appendage. - Attend appointment with the surgeon on August 19 to discuss the Watchman device. - Information given on Watchman  device  Hypertension/CHF Fluctuating home blood pressure readings possibly due to an outdated monitor and atrial fibrillation. - Recommend purchasing a new blood pressure monitor. - Schedule a nurse visit to compare home blood pressure readings with clinic readings.  Lower Gastrointestinal Bleed Recurrent lower gastrointestinal bleeds. - Improving since stopping Xarelto   Chronic Kidney Disease Stage 3b Chronic kidney disease  stage 3b requiring ongoing management. - Order labs to recheck kidney function.  Type 2 Diabetes Mellitus with Hyperlipidemia Advised dietary modifications to manage diabetes and hyperlipidemia. - Continue dietary modifications to reduce sugar and white bread intake.  Urinary Symptoms Intermittent burning during urination possibly due to pad use. - Collect urine sample to check for infection.  Senile Purpura - stable, reassured  General Health Maintenance Takes multiple vitamins; may not need additional iron if taking a multivitamin. - Consider discontinuing additional iron if taking a multivitamin. - Order serum vitamin D  level to assess need for supplementation.     Orders Placed This Encounter  Procedures   Urine Culture    Standing Status:   Future    Number of Occurrences:   1    Expiration Date:   07/07/2024   CBC with Differential/Platelet    Release to patient:   Immediate [1]   Comprehensive metabolic panel with GFR    Release to patient:   Immediate [1]   Hemoglobin A1c   Lipid panel   VITAMIN D  25 Hydroxy (Vit-D Deficiency, Fractures)   TSH   Vitamin B12   POCT urinalysis dipstick     No orders of the defined types were placed in this encounter.   Return in about 3 months (around 09/06/2024) for meds/labs.  Corean LITTIE Ku, FNP

## 2024-06-06 NOTE — Patient Instructions (Addendum)
 We are checking labs today, will be in contact with any results that require further attention  We are checking your urine today. We will send to culture as well  Follow up with me in about 3 months for labs and medication management, sooner if needed.

## 2024-06-07 ENCOUNTER — Ambulatory Visit

## 2024-06-08 ENCOUNTER — Ambulatory Visit
Admission: RE | Admit: 2024-06-08 | Discharge: 2024-06-08 | Disposition: A | Discharge status: Home / Self Care | Source: Ambulatory Visit | Attending: Family Medicine | Admitting: Family Medicine

## 2024-06-08 DIAGNOSIS — Z1231 Encounter for screening mammogram for malignant neoplasm of breast: Secondary | ICD-10-CM

## 2024-06-08 LAB — URINE CULTURE

## 2024-06-08 MED ORDER — CEPHALEXIN 500 MG PO CAPS: 500.0000 mg | ORAL_CAPSULE | Freq: Two times a day (BID) | ORAL | 0 refills | Status: AC

## 2024-06-13 ENCOUNTER — Ambulatory Visit: Payer: Medicare Other | Admitting: Nurse Practitioner

## 2024-06-13 ENCOUNTER — Telehealth: Payer: Self-pay | Admitting: Family Medicine

## 2024-06-13 NOTE — Telephone Encounter (Signed)
 Copied from CRM #[id number]. Topic: Clinical - Medication Question >> Jun 13, 2024 10:17 AM Chiquita SQUIBB wrote: Reason for CRM: Patients daughter Manuelita is calling in, asking if the patient needs to stop the iron medication that she takes? She also states that last night her blood pressure was 200/90. Then took it again and it was 150/70, but called her daughter again this morning stating that it was 200/90, they are unsure if it the way she is taking it on her own or if this is something to be concerned about. Manuelita states the last few times she has been in the office it has been fine, so they are asking if she needs to go back on her medication, or if this is something the patient is doing wrong. Please advise Manuelita at [phone]

## 2024-06-13 NOTE — Telephone Encounter (Signed)
 Spoke with patients daughter, Manuelita. Per [place], to monitor patients BP & HR daily. Patient could be going into Afib, Manuelita asks if they should restart patients diltiazem , per [place] continue holding medication. Patient is not symptomatic at all with the elevated BP readings, Manuelita thinks patient may be taking her BP's wrong. Has normal readings in the office per [place]. She will let us  know if readings are truly elevated, and monitor HR'S.

## 2024-06-14 ENCOUNTER — Other Ambulatory Visit: Payer: Self-pay | Admitting: Family Medicine

## 2024-06-14 DIAGNOSIS — R928 Other abnormal and inconclusive findings on diagnostic imaging of breast: Secondary | ICD-10-CM

## 2024-06-16 ENCOUNTER — Other Ambulatory Visit: Payer: Self-pay | Admitting: Nurse Practitioner

## 2024-06-16 DIAGNOSIS — I5032 Chronic diastolic (congestive) heart failure: Secondary | ICD-10-CM

## 2024-06-20 ENCOUNTER — Other Ambulatory Visit: Payer: Self-pay

## 2024-06-20 DIAGNOSIS — N3000 Acute cystitis without hematuria: Secondary | ICD-10-CM

## 2024-06-20 DIAGNOSIS — I5032 Chronic diastolic (congestive) heart failure: Secondary | ICD-10-CM

## 2024-06-20 DIAGNOSIS — R300 Dysuria: Secondary | ICD-10-CM

## 2024-06-20 MED ORDER — FUROSEMIDE 40 MG PO TABS: 40.0000 mg | ORAL_TABLET | Freq: Every day | ORAL | 3 refills | Status: AC

## 2024-06-22 ENCOUNTER — Other Ambulatory Visit

## 2024-06-22 ENCOUNTER — Ambulatory Visit

## 2024-06-22 ENCOUNTER — Other Ambulatory Visit: Payer: Self-pay | Admitting: Family Medicine

## 2024-06-22 DIAGNOSIS — N3000 Acute cystitis without hematuria: Secondary | ICD-10-CM

## 2024-06-22 DIAGNOSIS — I4819 Other persistent atrial fibrillation: Secondary | ICD-10-CM

## 2024-06-22 DIAGNOSIS — I10 Essential (primary) hypertension: Secondary | ICD-10-CM

## 2024-06-22 DIAGNOSIS — R300 Dysuria: Secondary | ICD-10-CM

## 2024-06-22 DIAGNOSIS — R928 Other abnormal and inconclusive findings on diagnostic imaging of breast: Secondary | ICD-10-CM

## 2024-06-22 MED ORDER — CARVEDILOL 3.125 MG PO TABS: 3.1250 mg | ORAL_TABLET | Freq: Two times a day (BID) | ORAL | 0 refills | Status: DC

## 2024-06-22 NOTE — Patient Instructions (Signed)
 Blood Pressure check today!  1st reading 180/88 2nd reading   Please continue to monitor readings at home

## 2024-06-22 NOTE — Progress Notes (Signed)
 Patient presents in office today for BP check. Initial reading 180/88, repeat reading 152/92. Provider added a medication for patient to start, explained medication to patient and called her daughter Shona as requested to update her on this as well. Patient knows to keep her 08/19 cardiology appt.

## 2024-06-23 LAB — URINE CULTURE: Result:: NO GROWTH

## 2024-06-26 ENCOUNTER — Ambulatory Visit: Payer: Self-pay | Admitting: Family Medicine

## 2024-06-28 ENCOUNTER — Encounter (HOSPITAL_COMMUNITY): Payer: Self-pay

## 2024-06-28 ENCOUNTER — Emergency Department (HOSPITAL_COMMUNITY)

## 2024-06-28 ENCOUNTER — Observation Stay (HOSPITAL_COMMUNITY)
Admission: EM | Admit: 2024-06-28 | Discharge: 2024-06-30 | Disposition: A | Discharge status: Home / Self Care | Source: Ambulatory Visit

## 2024-06-28 ENCOUNTER — Ambulatory Visit: Payer: Self-pay

## 2024-06-28 ENCOUNTER — Other Ambulatory Visit: Payer: Self-pay

## 2024-06-28 DIAGNOSIS — I4891 Unspecified atrial fibrillation: Secondary | ICD-10-CM | POA: Diagnosis not present

## 2024-06-28 DIAGNOSIS — R251 Tremor, unspecified: Secondary | ICD-10-CM | POA: Insufficient documentation

## 2024-06-28 DIAGNOSIS — Z79899 Other long term (current) drug therapy: Secondary | ICD-10-CM | POA: Insufficient documentation

## 2024-06-28 DIAGNOSIS — I130 Hypertensive heart and chronic kidney disease with heart failure and stage 1 through stage 4 chronic kidney disease, or unspecified chronic kidney disease: Secondary | ICD-10-CM | POA: Insufficient documentation

## 2024-06-28 DIAGNOSIS — I5032 Chronic diastolic (congestive) heart failure: Secondary | ICD-10-CM | POA: Insufficient documentation

## 2024-06-28 DIAGNOSIS — N1832 Chronic kidney disease, stage 3b: Secondary | ICD-10-CM | POA: Insufficient documentation

## 2024-06-28 DIAGNOSIS — Z8719 Personal history of other diseases of the digestive system: Secondary | ICD-10-CM

## 2024-06-28 DIAGNOSIS — I161 Hypertensive emergency: Secondary | ICD-10-CM | POA: Diagnosis present

## 2024-06-28 DIAGNOSIS — I440 Atrioventricular block, first degree: Secondary | ICD-10-CM | POA: Diagnosis not present

## 2024-06-28 DIAGNOSIS — R42 Dizziness and giddiness: Secondary | ICD-10-CM | POA: Diagnosis present

## 2024-06-28 DIAGNOSIS — I160 Hypertensive urgency: Principal | ICD-10-CM

## 2024-06-28 DIAGNOSIS — I5033 Acute on chronic diastolic (congestive) heart failure: Secondary | ICD-10-CM | POA: Insufficient documentation

## 2024-06-28 DIAGNOSIS — E785 Hyperlipidemia, unspecified: Secondary | ICD-10-CM | POA: Diagnosis not present

## 2024-06-28 DIAGNOSIS — Z8679 Personal history of other diseases of the circulatory system: Secondary | ICD-10-CM

## 2024-06-28 DIAGNOSIS — R519 Headache, unspecified: Secondary | ICD-10-CM | POA: Diagnosis present

## 2024-06-28 LAB — COMPREHENSIVE METABOLIC PANEL WITH GFR
ALT: 20 U/L (ref 0–44)
AST: 24 U/L (ref 15–41)
Albumin: 3.8 g/dL (ref 3.5–5.0)
Alkaline Phosphatase: 88 U/L (ref 38–126)
Anion gap: 8 (ref 5–15)
BUN: 21 mg/dL (ref 8–23)
CO2: 27 mmol/L (ref 22–32)
Calcium: 9.7 mg/dL (ref 8.9–10.3)
Chloride: 103 mmol/L (ref 98–111)
Creatinine, Ser: 1.18 mg/dL — ABNORMAL HIGH (ref 0.44–1.00)
GFR, Estimated: 44 mL/min — ABNORMAL LOW (ref >60)
Glucose, Bld: 143 mg/dL — ABNORMAL HIGH (ref 70–99)
Potassium: 4.5 mmol/L (ref 3.5–5.1)
Sodium: 138 mmol/L (ref 135–145)
Total Bilirubin: 0.9 mg/dL (ref 0.0–1.2)
Total Protein: 6.4 g/dL — ABNORMAL LOW (ref 6.5–8.1)

## 2024-06-28 LAB — CBC
HCT: 41.2 % (ref 36.0–46.0)
Hemoglobin: 12.9 g/dL (ref 12.0–15.0)
MCH: 29.8 pg (ref 26.0–34.0)
MCHC: 31.3 g/dL (ref 30.0–36.0)
MCV: 95.2 fL (ref 80.0–100.0)
Platelets: 231 K/uL (ref 150–400)
RBC: 4.33 MIL/uL (ref 3.87–5.11)
RDW: 14 % (ref 11.5–15.5)
WBC: 6 K/uL (ref 4.0–10.5)
nRBC: 0 % (ref 0.0–0.2)

## 2024-06-28 MED ORDER — ACETAMINOPHEN 650 MG RE SUPP: 650.0000 mg | Freq: Four times a day (QID) | RECTAL | Status: DC | PRN

## 2024-06-28 MED ORDER — HYDRALAZINE HCL 20 MG/ML IJ SOLN: 5.0000 mg | Freq: Four times a day (QID) | INTRAMUSCULAR | Status: DC | PRN

## 2024-06-28 MED ORDER — ACETAMINOPHEN 325 MG PO TABS
650.0000 mg | ORAL_TABLET | Freq: Four times a day (QID) | ORAL | Status: DC | PRN
  Administered 2024-06-29 – 2024-06-30 (×2): 650 mg via ORAL
  Filled 2024-06-28 (×2): qty 2

## 2024-06-28 MED ORDER — HYDROMORPHONE HCL 1 MG/ML IJ SOLN
1.0000 mg | Freq: Once | INTRAMUSCULAR | Status: AC
  Administered 2024-06-28 (×2): 1 mg via INTRAVENOUS
  Filled 2024-06-28: qty 1

## 2024-06-28 MED ORDER — KETOROLAC TROMETHAMINE 15 MG/ML IJ SOLN
15.0000 mg | Freq: Once | INTRAMUSCULAR | Status: AC
  Administered 2024-06-28 (×2): 15 mg via INTRAVENOUS
  Filled 2024-06-28: qty 1

## 2024-06-28 MED ORDER — HYDRALAZINE HCL 20 MG/ML IJ SOLN
10.0000 mg | Freq: Four times a day (QID) | INTRAMUSCULAR | Status: DC | PRN
  Administered 2024-06-29 – 2024-06-30 (×2): 10 mg via INTRAVENOUS
  Filled 2024-06-28 (×2): qty 1

## 2024-06-28 MED ORDER — SODIUM CHLORIDE 0.9 % IV SOLN: 250.0000 mL | INTRAVENOUS | Status: AC | PRN

## 2024-06-28 MED ORDER — ONDANSETRON HCL 4 MG PO TABS: 4.0000 mg | ORAL_TABLET | Freq: Four times a day (QID) | ORAL | Status: DC | PRN

## 2024-06-28 MED ORDER — ALBUTEROL SULFATE (2.5 MG/3ML) 0.083% IN NEBU: 2.5000 mg | INHALATION_SOLUTION | RESPIRATORY_TRACT | Status: DC | PRN

## 2024-06-28 MED ORDER — HYDRALAZINE HCL 20 MG/ML IJ SOLN
5.0000 mg | Freq: Once | INTRAMUSCULAR | Status: AC
  Administered 2024-06-28 (×2): 5 mg via INTRAVENOUS
  Filled 2024-06-28: qty 1

## 2024-06-28 MED ORDER — SODIUM CHLORIDE 0.9% FLUSH
3.0000 mL | Freq: Two times a day (BID) | INTRAVENOUS | Status: DC
  Administered 2024-06-29 (×2): 3 mL via INTRAVENOUS

## 2024-06-28 MED ORDER — POLYETHYLENE GLYCOL 3350 17 G PO PACK
17.0000 g | PACK | Freq: Every day | ORAL | Status: DC
  Administered 2024-06-29: 17 g via ORAL
  Filled 2024-06-28 (×2): qty 1

## 2024-06-28 MED ORDER — ONDANSETRON HCL 4 MG/2ML IJ SOLN: 4.0000 mg | Freq: Four times a day (QID) | INTRAMUSCULAR | Status: DC | PRN

## 2024-06-28 MED ORDER — AMIODARONE HCL 200 MG PO TABS
200.0000 mg | ORAL_TABLET | Freq: Every day | ORAL | Status: DC
  Administered 2024-06-29 – 2024-06-30 (×2): 200 mg via ORAL
  Filled 2024-06-28 (×2): qty 1

## 2024-06-28 MED ORDER — SODIUM CHLORIDE 0.9% FLUSH
3.0000 mL | INTRAVENOUS | Status: DC | PRN
Start: 2024-06-28 — End: 2024-06-30

## 2024-06-28 MED ORDER — HYDRALAZINE HCL 20 MG/ML IJ SOLN: 10.0000 mg | Freq: Four times a day (QID) | INTRAMUSCULAR | Status: DC | PRN

## 2024-06-28 MED ORDER — SODIUM CHLORIDE 0.9% FLUSH
3.0000 mL | Freq: Two times a day (BID) | INTRAVENOUS | Status: DC
  Administered 2024-06-28 – 2024-06-29 (×4): 3 mL via INTRAVENOUS

## 2024-06-28 MED ORDER — DIAZEPAM 2 MG PO TABS: 2.0000 mg | ORAL_TABLET | Freq: Three times a day (TID) | ORAL | Status: DC | PRN

## 2024-06-28 MED ORDER — BUTALBITAL-APAP-CAFFEINE 50-325-40 MG PO TABS
1.0000 | ORAL_TABLET | Freq: Four times a day (QID) | ORAL | Status: AC | PRN
  Administered 2024-06-29: 1 via ORAL
  Filled 2024-06-28: qty 1

## 2024-06-28 MED ORDER — FUROSEMIDE 40 MG PO TABS
40.0000 mg | ORAL_TABLET | Freq: Every day | ORAL | Status: DC
  Administered 2024-06-29 – 2024-06-30 (×2): 40 mg via ORAL
  Filled 2024-06-28 (×2): qty 1

## 2024-06-28 NOTE — ED Notes (Signed)
 ED Provider at bedside.

## 2024-06-28 NOTE — ED Provider Notes (Signed)
 [place] EMERGENCY DEPARTMENT AT [facility] Provider Note   CSN: [id number] Arrival date & time: 06/28/24  1143     Patient presents with: Hypertension   Briana [name] is a 89 y.o. female.   89 year old female presents for evaluation of elevated blood pressure.  States it has been like that for a while.  States a few days ago her blood pressure medication was switched.  States she has had off-and-on headaches as well as elevated blood pressure at home for the last few days.  States she called her primary care doctor's office and was told to come to the ER for further workup.  She denies any numbness or tingling, chest pain, shortness of breath, or any other symptoms or concerns at this time.   Hypertension Associated symptoms include headaches. Pertinent negatives include no chest pain, no abdominal pain and no shortness of breath.       Prior to Admission medications   Medication Sig Start Date End Date Taking? Authorizing Provider  albuterol  (PROAIR  HFA) 108 (90 Base) MCG/ACT inhaler Take  2 inhalations  15 minutes apart  every 4 hours  as needed for Wheezing 03/09/22  Yes Tonita Fallow, MD  amiodarone  (PACERONE ) 200 MG tablet Take 1 tablet (200 mg total) by mouth daily. 04/28/24  Yes [name], FNP  carvedilol  (COREG ) 3.125 MG tablet Take 1 tablet (3.125 mg total) by mouth 2 (two) times daily with a meal. DO NOT take if HR is less than 60 06/22/24  Yes [name], FNP  furosemide  (LASIX ) 40 MG tablet Take 1 tablet (40 mg total) by mouth daily. 06/20/24 06/20/25 Yes [name], FNP  Multiple Vitamins-Minerals (ZINC PO) Take 1 capsule by mouth daily.   Yes [provider]  polyethylene glycol (MIRALAX  / GLYCOLAX ) 17 g packet Take 17 g by mouth daily. Patient taking differently: Take 17 g by mouth as needed. 03/11/21  Yes Akula, Vijaya, MD  augmented betamethasone  dipropionate (DIPROLENE -AF) 0.05 % cream Apply topically 2 (two) times  daily. 05/10/24   [provider]  cholecalciferol  (VITAMIN D3) 25 MCG (1000 UNIT) tablet Take 3,000 Units by mouth daily.    [provider]  diazepam  (VALIUM ) 2 MG tablet Take 1 tablet 3 - 4 x /day for Tremor                                     /                                 TAKE                    BY                     MOUTH 06/20/22   Tonita Fallow, MD  loratadine (CLARITIN) 10 MG tablet Take 10 mg by mouth as needed for allergies.    [provider]    Allergies: Codeine, Accupril [quinapril hcl], Prednisone , Reglan [metoclopramide], Tramadol , Adhesive [tape], and Neosporin [neomycin-bacitracin zn-polymyx]    Review of Systems  Constitutional:  Negative for chills and fever.  HENT:  Negative for ear pain and sore throat.   Eyes:  Negative for pain and visual disturbance.  Respiratory:  Negative for cough and shortness of breath.   Cardiovascular:  Negative for  chest pain and palpitations.  Gastrointestinal:  Negative for abdominal pain and vomiting.  Genitourinary:  Negative for dysuria and hematuria.  Musculoskeletal:  Negative for arthralgias and back pain.  Skin:  Negative for color change and rash.  Neurological:  Positive for headaches. Negative for seizures and syncope.  All other systems reviewed and are negative.   Updated Vital Signs BP (!) 161/75   Pulse 60   Temp 98 F (36.7 C) (Oral)   Resp 18   Ht 5' (1.524 m)   Wt 59 kg   SpO2 92%   BMI 25.39 kg/m   Physical Exam Vitals and nursing note reviewed.  Constitutional:      General: She is not in acute distress.    Appearance: Normal appearance. She is well-developed. She is not ill-appearing.  HENT:     Head: Normocephalic and atraumatic.  Eyes:     Conjunctiva/sclera: Conjunctivae normal.  Cardiovascular:     Rate and Rhythm: Normal rate and regular rhythm.     Heart sounds: No murmur heard. Pulmonary:     Effort: Pulmonary effort is normal. No respiratory distress.      Breath sounds: Normal breath sounds.  Abdominal:     Palpations: Abdomen is soft.     Tenderness: There is no abdominal tenderness.  Musculoskeletal:        General: No swelling.     Cervical back: Neck supple.  Skin:    General: Skin is warm and dry.     Capillary Refill: Capillary refill takes less than 2 seconds.  Neurological:     General: No focal deficit present.     Mental Status: She is alert.  Psychiatric:        Mood and Affect: Mood normal.     (all labs ordered are listed, but only abnormal results are displayed) Labs Reviewed  COMPREHENSIVE METABOLIC PANEL WITH GFR - Abnormal; Notable for the following components:      Result Value   Glucose, Bld 143 (*)    Creatinine, Ser 1.18 (*)    Total Protein 6.4 (*)    GFR, Estimated 44 (*)    All other components within normal limits  CBC  COMPREHENSIVE METABOLIC PANEL WITH GFR  CBC    EKG: EKG Interpretation Date/Time:  Wednesday June 28 2024 12:52:37 EDT Ventricular Rate:  47 PR Interval:  226 QRS Duration:  108 QT Interval:  494 QTC Calculation: 437 R Axis:   240  Text Interpretation: Sinus bradycardia with 1st degree A-V block Right superior axis deviation Incomplete right bundle branch block Anteroseptal infarct , age undetermined Compared to prior EKG from 05/10/2024 Confirmed by Gennaro Bouchard ([id number]) on 06/28/2024 9:01:05 PM  Radiology: CT Head Wo Contrast Result Date: 06/28/2024 EXAM: CT HEAD WITHOUT CONTRAST 06/28/2024 01:25:31 PM TECHNIQUE: CT of the head was performed without the administration of intravenous contrast. Automated exposure control, iterative reconstruction, and/or weight based adjustment of the mA/kV was utilized to reduce the radiation dose to as low as reasonably achievable. COMPARISON: None available. CLINICAL HISTORY: Headache, new onset (Age >= 51y). Headache, hypertension. FINDINGS: BRAIN AND VENTRICLES: No acute hemorrhage. Gray-white differentiation is preserved. No hydrocephalus.  No extra-axial collection. No mass effect or midline shift. Patchy to confluent hyperdensities in the cerebral white matter are nonspecific but compatible with severe chronic small vessel ischemic disease. Mild cerebral atrophy is within normal limits for age. ORBITS: Bilateral cataract extraction. SINUSES: No acute abnormality. SOFT TISSUES AND SKULL: No acute soft tissue abnormality. No skull  fracture. Calcified atherosclerosis at the skull base. IMPRESSION: 1. No acute intracranial abnormality. 2. Severe chronic small vessel ischemic disease. Electronically signed by: Dasie Hamburg MD 06/28/2024 02:08 PM EDT RP Workstation: [id number]     Procedures   Medications Ordered in the ED  amiodarone  (PACERONE ) tablet 200 mg (has no administration in time range)  furosemide  (LASIX ) tablet 40 mg (has no administration in time range)  diazepam  (VALIUM ) tablet 2 mg (has no administration in time range)  polyethylene glycol (MIRALAX  / GLYCOLAX ) packet 17 g (has no administration in time range)  albuterol  (PROVENTIL ) (2.5 MG/3ML) 0.083% nebulizer solution 2.5 mg (has no administration in time range)  sodium chloride  flush (NS) 0.9 % injection 3 mL (3 mLs Intravenous Given 06/28/24 2223)  sodium chloride  flush (NS) 0.9 % injection 3 mL (3 mLs Intravenous Not Given 06/28/24 2224)  sodium chloride  flush (NS) 0.9 % injection 3 mL (has no administration in time range)  0.9 %  sodium chloride  infusion (has no administration in time range)  acetaminophen  (TYLENOL ) tablet 650 mg (has no administration in time range)    Or  acetaminophen  (TYLENOL ) suppository 650 mg (has no administration in time range)  butalbital -acetaminophen -caffeine  (FIORICET ) 50-325-40 MG per tablet 1 tablet (has no administration in time range)  ondansetron  (ZOFRAN ) tablet 4 mg (has no administration in time range)    Or  ondansetron  (ZOFRAN ) injection 4 mg (has no administration in time range)  hydrALAZINE  (APRESOLINE ) injection 5 mg (has no  administration in time range)  hydrALAZINE  (APRESOLINE ) injection 5 mg (5 mg Intravenous Given 06/28/24 2118)  ketorolac  (TORADOL ) 15 MG/ML injection 15 mg (15 mg Intravenous Given 06/28/24 2120)  HYDROmorphone  (DILAUDID ) injection 1 mg (1 mg Intravenous Given 06/28/24 2151)                                    Medical Decision Making Cardiac monitor interpretation: Sinus bradycardia, no ectopy  Patient here for headache and hypertension.  Blood pressure did improve and she is still having a headache.  Was given a small dose of hydralazine  and Toradol  but still headache seems to be worsening.  Will give her a dose of Dilaudid  as well.  Initial blood pressure was 200s over 100s.  Did improve to 188/82 and she has been sinus bradycardia on the monitor.  Discussed patient's case with hospitalist the patient be admitted for hypertensive urgency.  Patient is agreeable with the plan.  Problems Addressed: Hypertensive urgency: acute illness or injury that poses a threat to life or bodily functions  Amount and/or Complexity of Data Reviewed External Data Reviewed: notes.    Details: Prior records reviewed and patient has a history of A-fib and hypertension, requiring multiple different medications Labs: ordered. Decision-making details documented in ED Course.    Details: Ordered and reviewed by me and unremarkable Radiology: ordered and independent interpretation performed. Decision-making details documented in ED Course.    Details: Imaging ordered and interpreted independently radiology CT head: Shows no acute abnormality ECG/medicine tests: ordered and independent interpretation performed. Decision-making details documented in ED Course.    Details: Ordered and interpreted by me in the absence of cardiology and shows sinus rhythm, no STEMI no acute change when compared to prior Discussion of management or test interpretation with external provider(s): Dr. Delane spoke with the her  regarding the patient and she accepted the patient to her service for further workup and management  Risk OTC  drugs. Prescription drug management. Parenteral controlled substances. Drug therapy requiring intensive monitoring for toxicity. Decision regarding hospitalization.     Final diagnoses:  Hypertensive urgency    ED Discharge Orders     None          Gennaro Duwaine CROME, DO 06/28/24 2228

## 2024-06-28 NOTE — ED Provider Triage Note (Addendum)
 Emergency Medicine Provider Triage Evaluation Note  Briana [name] , a 89 y.o. female  was evaluated in triage.  Pt complains of high blood pressure for 1 year.  States she also has a headache that has been ongoing all day but may have been present yesterday. Does not recall a sudden onset.   She called PCP who recommended she come to ER for eval  No vision changes nausea or vomiting  Does describe headache as 8/10, like a band around her forehead   Review of Systems  Positive: Headache Negative: Fever   Physical Exam  BP (!) 194/78 (BP Location: Left Arm)   Pulse (!) 53   Temp 98.3 F (36.8 C)   Resp 17   Ht 5' (1.524 m)   Wt 59 kg   SpO2 95%   BMI 25.39 kg/m  Gen:   Awake, no distress   Resp:  Normal effort  MSK:   Moves extremities without difficulty  Other:    Medical Decision Making  Medically screening exam initiated at 1:00 PM.  Appropriate orders placed.  JANETT KAMATH was informed that the remainder of the evaluation will be completed by another provider, this initial triage assessment does not replace that evaluation, and the importance of remaining in the ED until their evaluation is complete.  Labs, CT head   Neldon Hamp [place], [name] 06/28/24 1302    Neldon Hamp [place], [name] 06/28/24 1305

## 2024-06-28 NOTE — Telephone Encounter (Signed)
 FYI Only or Action Required?: FYI only for provider.  Patient was last seen in primary care on 06/06/2024 by [name], FNP.  Called Nurse Triage reporting Hypertension.  Symptoms began today.  Interventions attempted: Nothing.  Symptoms are: unchanged.  Triage Disposition: Go to ED Now (Notify PCP)  Patient/caregiver understands and will follow disposition?: Yes, will follow disposition  Copied from CRM [id number]. Topic: Clinical - Red Word Triage >> Jun 28, 2024  9:50 AM Jayma L wrote: Red Word that prompted transfer to Nurse Triage: patient said her blood pressure is 201/90 and pulse was 55. Said she has a headache as well . Asking to speak to nurse Reason for Disposition  [1] Systolic BP >= 160 OR Diastolic >= 100 AND [2] cardiac (e.g., breathing difficulty, chest pain) or neurologic symptoms (e.g., new-onset blurred or double vision, unsteady gait)  Answer Assessment - Initial Assessment Questions 1. BLOOD PRESSURE: What is your blood pressure? Did you take at least two measurements 5 minutes apart?     201/90. 197/91. Pt was asked to sit with feet flat on floor and take BP after sitting for about 5 mins, pt BP was 208/94 2. ONSET: When did you take your blood pressure?     This morning, both readings 3. HOW: How did you take your blood pressure? (e.g., automatic home BP monitor, visiting nurse)     auto 4. HISTORY: Do you have a history of high blood pressure?     Pt states she kind of has hx, been up/down for years 5. MEDICINES: Are you taking any medicines for blood pressure? Have you missed any doses recently?     Pt states that she does not believe she took the dose yesterday, pt states that she just started the medication a few days ago 6. OTHER SYMPTOMS: Do you have any symptoms? (e.g., blurred vision, chest pain, difficulty breathing, headache, weakness)    HA-started hurting when I got up this morning.  Pt advised to go to ED d/t HTN  readings. Pt agreeable. Pt advised to call 911 if she developed CP, difficulty breathing, or vision changes. At time of call pt c/o HA, stated that it started hurting when she woke this morning.  Protocols used: Blood Pressure - High-A-AH

## 2024-06-28 NOTE — ED Triage Notes (Signed)
 Pt bib POV with friend c/o HTN. Pt woke up this morning with SBP 201/90.   Pt endorses she has a headache throughout head and dizziness.  Pt denies blurred vision.

## 2024-06-28 NOTE — Group Note (Deleted)
 Date:  06/28/2024 Time:  2:22 PM  Group Topic/Focus:  Wellness Toolbox:   The focus of this group is to discuss various aspects of wellness, balancing those aspects and exploring ways to increase the ability to experience wellness.  Patients will create a wellness toolbox for use upon discharge.     Participation Level:  {BHH PARTICIPATION OZCZO:77735}  Participation Quality:  {BHH PARTICIPATION QUALITY:22265}  Affect:  {BHH AFFECT:22266}  Cognitive:  {BHH COGNITIVE:22267}  Insight: {BHH Insight2:20797}  Engagement in Group:  {BHH ENGAGEMENT IN HMNLE:77731}  Modes of Intervention:  {BHH MODES OF INTERVENTION:22269}  Additional Comments:  ***  Myra Curtistine BROCKS 06/28/2024, 2:22 PM

## 2024-06-28 NOTE — H&P (Signed)
 History and Physical    Briana [name] FMW:[id number] DOB: 05-25-1934 DOA: 06/28/2024  PCP: [name], FNP   Patient coming from: Home   Chief Complaint:  Chief Complaint  Patient presents with   Hypertension   ED TRIAGE note:Pt bib POV with friend c/o HTN. Pt woke up this morning with SBP 201/90. Pt endorses she has a headache throughout head and dizziness. Pt denies blurred vision.   HPI:  Briana [name] is a 89 y.o. female with medical history significant of atrial flutter status post cardioversion in May 2025, first-degree AV block, recent GI bleed in June 2025 not on anticoagulation anymore since June 2025 as high risk of bleeding (due to advanced age also not a candidate for Watchman device), HFpEF 60 to 65%, CKD stage IIIb, essential hypertension, hyperlipidemia, history of prior GI bleed from chronic diverticulosis and chronic tremor-takes Valium  as needed presented to emergency department with complaining of headache and elevated blood pressure.  On my evaluation at the bedside patient's blood pressure has been improved to upper 160s range and patient is stating that headache already has been improved as well.  Patient denies any chest pain, shortness of breath, palpitation.   ED Course:  At presentation to ED patient blood pressure was 194/78, heart rate 53 otherwise hemodynamically stable. CBC unremarkable. CMP showing creatinine 1.18 and GFR 44.  Renal function at baseline.  CT head no acute intracranial abnormality.Severe chronic small vessel ischemic disease.   EKG showing sinus bradycardia with first-degree AV block heart rate 47  In the ED patient received hydralazine  5 mg, Dilaudid  1 mg and Toradol  15 mg.  Hospitalist has been consulted for further evaluation management of hypertensive emergency.  Significant labs in the ED: Lab Orders         Comprehensive metabolic panel         CBC         Comprehensive metabolic panel         CBC       Review  of Systems:  Review of Systems  Constitutional:  Negative for chills, fever and weight loss.  HENT:         Generalized headache  Eyes:  Negative for blurred vision.  Respiratory:  Negative for cough, hemoptysis, sputum production, shortness of breath and wheezing.   Cardiovascular:  Negative for chest pain, palpitations and leg swelling.  Gastrointestinal:  Negative for heartburn and nausea.  Neurological:  Positive for headaches. Negative for dizziness, tingling, tremors, sensory change, speech change and weakness.  Psychiatric/Behavioral:  The patient is not nervous/anxious.     Past Medical History:  Diagnosis Date   Acute on chronic diastolic (congestive) heart failure (HCC) 11/21/2018   Cancer (HCC)    skin cancer   DDD (degenerative disc disease)    Diverticula of colon    Diverticulitis    DJD (degenerative joint disease)    GERD (gastroesophageal reflux disease)    takes ranitidine   Heart murmur    History of hiatal hernia    History of pneumonia    Hx of irritable bowel syndrome    Hyperlipidemia    Hypertension    Occasional tremors    Osteoporosis    Pre-diabetes    Varicose veins    Vitamin D  deficiency     Past Surgical History:  Procedure Laterality Date   APPENDECTOMY     BREAST EXCISIONAL BIOPSY Right    BREAST SURGERY Right    fluid drained from breast  CARDIOVERSION N/A 12/20/2018   Procedure: CARDIOVERSION;  Surgeon: Lonni Slain, MD;  Location: [facility] ENDOSCOPY;  Service: Cardiovascular;  Laterality: N/A;   CARDIOVERSION N/A 06/16/2019   Procedure: CARDIOVERSION;  Surgeon: Okey Vina GAILS, MD;  Location: [facility] ENDOSCOPY;  Service: Cardiovascular;  Laterality: N/A;   CARDIOVERSION N/A 05/17/2023   Procedure: CARDIOVERSION;  Surgeon: Okey Vina GAILS, MD;  Location: [facility] INVASIVE CV LAB;  Service: Cardiovascular;  Laterality: N/A;   CARDIOVERSION N/A 03/29/2024   Procedure: CARDIOVERSION;  Surgeon: Lonni Slain, MD;  Location: [facility] INVASIVE CV LAB;   Service: Cardiovascular;  Laterality: N/A;   COLONOSCOPY     ESOPHAGOGASTRODUODENOSCOPY     FIDUCIAL MARKER PLACEMENT  11/20/2023   Procedure: CLIP MARKER PLACEMENT;  Surgeon: Rollin Dover, MD;  Location: [facility] ENDOSCOPY;  Service: Gastroenterology;;   ENID SIGMOIDOSCOPY N/A 03/09/2021   Procedure: FLEXIBLE SIGMOIDOSCOPY;  Surgeon: Shila Gustav GAILS, MD;  Location: [facility] ENDOSCOPY;  Service: Endoscopy;  Laterality: N/A;   FLEXIBLE SIGMOIDOSCOPY N/A 11/20/2023   Procedure: FLEXIBLE SIGMOIDOSCOPY;  Surgeon: Rollin Dover, MD;  Location: [facility] ENDOSCOPY;  Service: Gastroenterology;  Laterality: N/A;   HEMOSTASIS CLIP PLACEMENT  03/09/2021   Procedure: HEMOSTASIS CLIP PLACEMENT;  Surgeon: Shila Gustav GAILS, MD;  Location: [facility] ENDOSCOPY;  Service: Endoscopy;;   JOINT REPLACEMENT Left    left hip   LUMBAR LAMINECTOMY/DECOMPRESSION MICRODISCECTOMY N/A 06/05/2015   Procedure: L3-L5 DECOMPRESSION ;  Surgeon: Donaciano Sprang, MD;  Location: [facility] OR;  Service: Orthopedics;  Laterality: N/A;   OPEN REDUCTION INTERNAL FIXATION (ORIF) DISTAL RADIAL FRACTURE Right 01/10/2014   Procedure: OPEN REDUCTION INTERNAL FIXATION (ORIF) DISTAL RADIAL FRACTURE;  Surgeon: Prentice LELON Pagan, MD;  Location: [facility] OR;  Service: Orthopedics;  Laterality: Right;   SPINAL CORD DECOMPRESSION  06/05/2015   L3 L 5   THYROID  SURGERY     TONSILLECTOMY     TUBAL LIGATION     varicose veins stripped     WRIST FRACTURE SURGERY Bilateral      reports that she has never smoked. She has never used smokeless tobacco. She reports that she does not drink alcohol  and does not use drugs.  Allergies  Allergen Reactions   Codeine Rash   Accupril [Quinapril Hcl] Cough   Prednisone      Agitated and shaky   Reglan [Metoclopramide] Other (See Comments)    tremors   Tramadol  Nausea And Vomiting   Adhesive [Tape] Rash   Neosporin [Neomycin-Bacitracin Zn-Polymyx] Itching and Rash    Family History  Problem Relation Age of Onset   Hypertension Mother     Hyperlipidemia Mother    Heart disease Sister    Cancer Brother        prostate   Colon cancer Neg Hx    Esophageal cancer Neg Hx    Pancreatic cancer Neg Hx    Stomach cancer Neg Hx    Liver disease Neg Hx    Breast cancer Neg Hx     Prior to Admission medications   Medication Sig Start Date End Date Taking? Authorizing Provider  albuterol  (PROAIR  HFA) 108 (90 Base) MCG/ACT inhaler Take  2 inhalations  15 minutes apart  every 4 hours  as needed for Wheezing 03/09/22   Tonita Fallow, MD  amiodarone  (PACERONE ) 200 MG tablet Take 1 tablet (200 mg total) by mouth daily. 04/28/24   [name], FNP  Ascorbic Acid  (VITAMIN C  PO) Take 1 tablet by mouth daily.    [provider]  augmented betamethasone  dipropionate (DIPROLENE -AF) 0.05 % cream Apply  topically 2 (two) times daily. 05/10/24   [provider]  carvedilol  (COREG ) 3.125 MG tablet Take 1 tablet (3.125 mg total) by mouth 2 (two) times daily with a meal. DO NOT take if HR is less than 60 06/22/24   [name], FNP  cholecalciferol  (VITAMIN D3) 25 MCG (1000 UNIT) tablet Take 3,000 Units by mouth daily.    [provider]  diazepam  (VALIUM ) 2 MG tablet Take 1 tablet 3 - 4 x /day for Tremor                                     /                                 TAKE                    BY                     MOUTH 06/20/22   Tonita Fallow, MD  furosemide  (LASIX ) 40 MG tablet Take 1 tablet (40 mg total) by mouth daily. 06/20/24 06/20/25  [name], FNP  loratadine (CLARITIN) 10 MG tablet Take 10 mg by mouth as needed for allergies.    [provider]  Multiple Vitamins-Minerals (ZINC PO) Take 1 capsule by mouth daily.    [provider]  polyethylene glycol (MIRALAX  / GLYCOLAX ) 17 g packet Take 17 g by mouth daily. Patient taking differently: Take 17 g by mouth as needed. 03/11/21   Cherlyn Labella, MD     Physical Exam: Vitals:   06/28/24 1953 06/28/24 2041 06/28/24  2130 06/28/24 2200  BP: (!) 201/91 (!) 222/102 (!) 188/82 (!) 161/75  Pulse: (!) 54  (!) 53 60  Resp: 17 18    Temp: 97.9 F (36.6 C) 98 F (36.7 C)    TempSrc: Oral Oral    SpO2: 96% 94% 96% 92%  Weight:      Height:        Physical Exam Vitals and nursing note reviewed.  HENT:     Head: Normocephalic and atraumatic.     Mouth/Throat:     Mouth: Mucous membranes are moist.  Eyes:     Pupils: Pupils are equal, round, and reactive to light.  Cardiovascular:     Rate and Rhythm: Regular rhythm. Bradycardia present.     Pulses: Normal pulses.     Heart sounds: Normal heart sounds.  Pulmonary:     Breath sounds: Normal breath sounds. No wheezing.  Abdominal:     Palpations: Abdomen is soft.  Musculoskeletal:     Cervical back: Neck supple.     Right lower leg: No edema.     Left lower leg: No edema.  Skin:    General: Skin is dry.     Capillary Refill: Capillary refill takes less than 2 seconds.  Neurological:     Mental Status: She is alert and oriented to person, place, and time.     Motor: No weakness.  Psychiatric:        Mood and Affect: Mood normal.        Behavior: Behavior normal.        Thought Content: Thought content normal.      Labs on Admission: I have personally reviewed following labs and imaging studies  CBC: Recent Labs  Lab 06/28/24 1312  WBC 6.0  HGB 12.9  HCT 41.2  MCV 95.2  PLT 231   Basic Metabolic Panel: Recent Labs  Lab 06/28/24 1312  NA 138  K 4.5  CL 103  CO2 27  GLUCOSE 143*  BUN 21  CREATININE 1.18*  CALCIUM 9.7   GFR: Estimated Creatinine Clearance: 26.5 mL/min (A) (by C-G formula based on SCr of 1.18 mg/dL (H)). Liver Function Tests: Recent Labs  Lab 06/28/24 1312  AST 24  ALT 20  ALKPHOS 88  BILITOT 0.9  PROT 6.4*  ALBUMIN  3.8   No results for input(s): LIPASE, AMYLASE in the last 168 hours. No results for input(s): AMMONIA in the last 168 hours. Coagulation Profile: No results for input(s):  INR, PROTIME in the last 168 hours. Cardiac Enzymes: No results for input(s): CKTOTAL, CKMB, CKMBINDEX, TROPONINI, TROPONINIHS in the last 168 hours. BNP (last 3 results) No results for input(s): BNP in the last 8760 hours. HbA1C: No results for input(s): HGBA1C in the last 72 hours. CBG: No results for input(s): GLUCAP in the last 168 hours. Lipid Profile: No results for input(s): CHOL, HDL, LDLCALC, TRIG, CHOLHDL, LDLDIRECT in the last 72 hours. Thyroid  Function Tests: No results for input(s): TSH, T4TOTAL, FREET4, T3FREE, THYROIDAB in the last 72 hours. Anemia Panel: No results for input(s): VITAMINB12, FOLATE, FERRITIN, TIBC, IRON, RETICCTPCT in the last 72 hours. Urine analysis:    Component Value Date/Time   COLORURINE YELLOW 04/24/2024 0940   APPEARANCEUR CLOUDY (A) 04/24/2024 0940   LABSPEC 1.012 04/24/2024 0940   PHURINE 6.0 04/24/2024 0940   GLUCOSEU NEGATIVE 04/24/2024 0940   HGBUR LARGE (A) 04/24/2024 0940   BILIRUBINUR Negative 06/06/2024 1149   KETONESUR NEGATIVE 04/24/2024 0940   PROTEINUR Negative 06/06/2024 1149   PROTEINUR 30 (A) 04/24/2024 0940   UROBILINOGEN 0.2 06/06/2024 1149   UROBILINOGEN 0.2 12/12/2012 1240   NITRITE Negative 06/06/2024 1149   NITRITE POSITIVE (A) 04/24/2024 0940   LEUKOCYTESUR Large (3+) (A) 06/06/2024 1149   LEUKOCYTESUR LARGE (A) 04/24/2024 0940    Radiological Exams on Admission: I have personally reviewed images CT Head Wo Contrast Result Date: 06/28/2024 EXAM: CT HEAD WITHOUT CONTRAST 06/28/2024 01:25:31 PM TECHNIQUE: CT of the head was performed without the administration of intravenous contrast. Automated exposure control, iterative reconstruction, and/or weight based adjustment of the mA/kV was utilized to reduce the radiation dose to as low as reasonably achievable. COMPARISON: None available. CLINICAL HISTORY: Headache, new onset (Age >= 51y). Headache, hypertension.  FINDINGS: BRAIN AND VENTRICLES: No acute hemorrhage. Gray-white differentiation is preserved. No hydrocephalus. No extra-axial collection. No mass effect or midline shift. Patchy to confluent hyperdensities in the cerebral white matter are nonspecific but compatible with severe chronic small vessel ischemic disease. Mild cerebral atrophy is within normal limits for age. ORBITS: Bilateral cataract extraction. SINUSES: No acute abnormality. SOFT TISSUES AND SKULL: No acute soft tissue abnormality. No skull fracture. Calcified atherosclerosis at the skull base. IMPRESSION: 1. No acute intracranial abnormality. 2. Severe chronic small vessel ischemic disease. Electronically signed by: Dasie Hamburg MD 06/28/2024 02:08 PM EDT RP Workstation: [id number]     EKG: My personal interpretation of EKG shows: First-degree AV block and sinus bradycardia heart rate 47    Assessment/Plan: Principal Problem:   Hypertensive emergency Active Problems:   Chronic diastolic CHF (congestive heart failure) (HCC)   Chronic tremor   First degree AV block   History of atrial fibrillation   History of GI bleed  CKD stage 3b, GFR 30-44 ml/min (HCC)   Hyperlipidemia    Assessment and Plan: Hypertensive emergency History of diastolic heart failure with preserved EF 60 to 65% -Presented to emergency room for with complaining of headache and elevated blood pressure from today morning.  Patient complaining about headache and dizziness. - On presentation to ED patient still complaining about headache and elevated blood pressure 194/78.  Received hydralazine  5 mg blood pressure has been improved to 192/82.  Still complaining of a significant headache.  Denies any chest pain, shortness of breath and palpitation. - CBC and CMP unremarkable.  Renal function at baseline.  CT head no acute intracranial abnormality.  EKG showing sinus bradycardia heart rate 47 and first-degree AV block.  Per chart review patient has posterior AV  block since June 2025.  Given patient developed bradycardia Cardizem  was discontinued during last hospitalization in June 2025. - In the setting of headache and a systolic blood pressure above 819 patient meets criteria for hypertensive emergency. - Continue IV hydralazine  as needed.  Continue home Lasix  40 mg daily. - Holding Coreg  in the setting of AV block and bradycardia. - Obtain echocardiogram - Continue cardiac monitoring   Generalized headache in the setting of elevated blood pressure - Continue Tylenol  and Fioricet as needed.   History of first-degree AV block History of paroxysmal defibrillation-noted anticoagulation -Patient found to have active lower GI bleed and anemia secondary to diverticular bleed in June 2025.  Required blood transfusion.  During that time GI recommended to hold anticoagulation and given patient has advanced age not a candidate for any intervention.  Later on patient being evaluated by cardiology and after discussion with the patient's and patient family Xarelto  was discontinued permanently given history of recurrent GI bleed.  Unfortunately patient was also not a candidate for Watchman device due to multiple comorbidities and advanced age. -In the setting of sinus bradycardia holding Coreg  as of now.  History of GI bleed in June 2025 -Concern for GI bleed in the past in the setting of diverticular bleeding.  CBC showing stable H&H 12.9 and 41.  Continue to monitor.  CKD stage IIIb -Creatinine 1.18 and GFR 44.  Renal function at baseline.  Will avoid nephrotoxic agent.    Chronic tremor -Continue Valium  as needed   DVT prophylaxis:  SCDs.  Pharmacological DVT prophylaxis contraindicated in the setting of history of GI bleed. Code Status:  Full Code Diet: Heart healthy diet Family Communication: Patient's friend was present at bedside, at the time of interview. Opportunity was given to ask question and all questions were answered satisfactorily.   Daughters are available over phone. Disposition Plan: Continue to monitor improvement of blood pressure. Consults: None indicated at this time Admission status:   Observation, Telemetry bed  Severity of Illness: The appropriate patient status for this patient is OBSERVATION. Observation status is judged to be reasonable and necessary in order to provide the required intensity of service to ensure the patient's safety. The patient's presenting symptoms, physical exam findings, and initial radiographic and laboratory data in the context of their medical condition is felt to place them at decreased risk for further clinical deterioration. Furthermore, it is anticipated that the patient will be medically stable for discharge from the hospital within 2 midnights of admission.     Sadi Arave, MD [facility]  How to contact the [facility] Attending or Consulting provider 7A - 7P or covering provider during after hours 7P -7A, for this patient.  Check the care team  in [facility] and look for a) attending/consulting [facility] provider listed and b) the [facility] team listed Log into [email or web address] and use [place]'s universal password to access. If you do not have the password, please contact the hospital operator. Locate the [facility] provider you are looking for under [facility] and page to a number that you can be directly reached. If you still have difficulty reaching the provider, please page the [facility] (Director on Call) for the Hospitalists listed on amion for assistance.  06/28/2024, 10:28 PM

## 2024-06-28 NOTE — ED Notes (Signed)
 Pt's room air oxygen saturation decreased to 88% when sleeping. Pt placed on 2L [place] increasing oxygen saturation to 96%. Pt resting in position of comfort on locked stretcher with call bell & family in reach.

## 2024-06-28 NOTE — ED Triage Notes (Signed)
 Denies numbness/tingling.

## 2024-06-29 ENCOUNTER — Observation Stay (HOSPITAL_BASED_OUTPATIENT_CLINIC_OR_DEPARTMENT_OTHER)

## 2024-06-29 ENCOUNTER — Other Ambulatory Visit (HOSPITAL_COMMUNITY)

## 2024-06-29 DIAGNOSIS — N1832 Chronic kidney disease, stage 3b: Secondary | ICD-10-CM | POA: Diagnosis not present

## 2024-06-29 DIAGNOSIS — E782 Mixed hyperlipidemia: Secondary | ICD-10-CM

## 2024-06-29 DIAGNOSIS — I161 Hypertensive emergency: Secondary | ICD-10-CM | POA: Diagnosis not present

## 2024-06-29 DIAGNOSIS — I5032 Chronic diastolic (congestive) heart failure: Secondary | ICD-10-CM | POA: Diagnosis not present

## 2024-06-29 DIAGNOSIS — I440 Atrioventricular block, first degree: Secondary | ICD-10-CM | POA: Diagnosis not present

## 2024-06-29 LAB — ECHOCARDIOGRAM LIMITED
Area-P 1/2: 2.87 cm2
Height: 60 in
P 1/2 time: 524 ms
S' Lateral: 2.6 cm
Weight: 2080 [oz_av]

## 2024-06-29 LAB — COMPREHENSIVE METABOLIC PANEL WITH GFR
ALT: 19 U/L (ref 0–44)
AST: 24 U/L (ref 15–41)
Albumin: 3.6 g/dL (ref 3.5–5.0)
Alkaline Phosphatase: 83 U/L (ref 38–126)
Anion gap: 7 (ref 5–15)
BUN: 20 mg/dL (ref 8–23)
CO2: 27 mmol/L (ref 22–32)
Calcium: 9.6 mg/dL (ref 8.9–10.3)
Chloride: 103 mmol/L (ref 98–111)
Creatinine, Ser: 1.08 mg/dL — ABNORMAL HIGH (ref 0.44–1.00)
GFR, Estimated: 49 mL/min — ABNORMAL LOW (ref >60)
Glucose, Bld: 160 mg/dL — ABNORMAL HIGH (ref 70–99)
Potassium: 4.4 mmol/L (ref 3.5–5.1)
Sodium: 137 mmol/L (ref 135–145)
Total Bilirubin: 0.8 mg/dL (ref 0.0–1.2)
Total Protein: 6.3 g/dL — ABNORMAL LOW (ref 6.5–8.1)

## 2024-06-29 LAB — CBC
HCT: 40.8 % (ref 36.0–46.0)
Hemoglobin: 12.8 g/dL (ref 12.0–15.0)
MCH: 29.6 pg (ref 26.0–34.0)
MCHC: 31.4 g/dL (ref 30.0–36.0)
MCV: 94.2 fL (ref 80.0–100.0)
Platelets: 216 K/uL (ref 150–400)
RBC: 4.33 MIL/uL (ref 3.87–5.11)
RDW: 13.9 % (ref 11.5–15.5)
WBC: 6.5 K/uL (ref 4.0–10.5)
nRBC: 0 % (ref 0.0–0.2)

## 2024-06-29 MED ORDER — IBUPROFEN 200 MG PO TABS
400.0000 mg | ORAL_TABLET | Freq: Once | ORAL | Status: AC
  Administered 2024-06-29: 400 mg via ORAL
  Filled 2024-06-29: qty 2

## 2024-06-29 MED ORDER — LOSARTAN POTASSIUM 50 MG PO TABS
50.0000 mg | ORAL_TABLET | Freq: Every day | ORAL | Status: DC
  Administered 2024-06-29 – 2024-06-30 (×2): 50 mg via ORAL
  Filled 2024-06-29 (×2): qty 1

## 2024-06-29 MED ORDER — AMLODIPINE BESYLATE 10 MG PO TABS
10.0000 mg | ORAL_TABLET | Freq: Every day | ORAL | Status: DC
  Administered 2024-06-29 – 2024-06-30 (×2): 10 mg via ORAL
  Filled 2024-06-29 (×2): qty 1

## 2024-06-29 NOTE — Progress Notes (Signed)
 Progress Note   Patient: Briana [name] FMW:[id number] DOB: 08-03-1934 DOA: 06/28/2024     0 DOS: the patient was seen and examined on 06/29/2024   Brief hospital course: Briana [name] is a 89 y.o. female with medical history significant of atrial flutter status post cardioversion in May 2025, first-degree AV block, recent GI bleed in June 2025 not on anticoagulation anymore since June 2025 as high risk of bleeding (due to advanced age also not a candidate for Watchman device), HFpEF 60 to 65%, CKD stage IIIb, essential hypertension, hyperlipidemia, history of prior GI bleed from chronic diverticulosis and chronic tremor-takes Valium  as needed presented to emergency department with complaining of headache and elevated blood pressure.   Assessment and Plan: Hypertensive urgency Continue IV hydralazine  as needed.  Continue home Lasix  40 mg daily. Hold Coreg  in the setting of AV block and bradycardia. Obtain echocardiogram. Started norvasc , losartan  Continue cardiac monitoring. Motrin  one time for headache.  Generalized headache in the setting of elevated blood pressure Continue Tylenol  and Fioricet  as needed.   History of first-degree AV block History of paroxysmal defibrillation-noted anticoagulation Patient found to have active lower GI bleed and anemia secondary to diverticular bleed in June 2025.  Required blood transfusion.  During that time GI recommended to hold anticoagulation and given patient has advanced age not a candidate for any intervention.   Off xarelto  due to recurrent GI bleed.   Not a candidate for Watchman device due to multiple comorbidities and advanced age. In the setting of sinus bradycardia holding Coreg .   History of GI bleed in June 2025 Concern for GI bleed in the past in the setting of diverticular bleeding.  CBC showing stable H&H 12.9 and 41.  Continue to monitor.   CKD stage IIIb Creatinine 1.18 and GFR 44.  Renal function at baseline.   Avoid nephrotoxic  agent.    Chronic tremor Continue Valium  as needed     Out of bed to chair. Incentive spirometry. Nursing supportive care. Fall, aspiration precautions. Diet:  Diet Orders (From admission, onward)     Start     Ordered   06/28/24 2210  Diet Heart Room service appropriate? Yes; Fluid consistency: Thin  Diet effective now       Question Answer Comment  Room service appropriate? Yes   Fluid consistency: Thin      06/28/24 2209           DVT prophylaxis: Place TED hose Start: 06/28/24 2210  Level of care: Telemetry Cardiac   Code Status: Full Code  Subjective: Patient is seen and examined today morning. She is complaining of headache. Dizziness better. BP improved to 180s.  Physical Exam: Vitals:   06/29/24 0800 06/29/24 0900 06/29/24 0916 06/29/24 1000  BP: (!) 178/72 124/66  (!) 157/71  Pulse: (!) 57 63  63  Resp: (!) 24 17  15   Temp:   97.9 F (36.6 C)   TempSrc:   Oral   SpO2: 96% 97%  97%  Weight:      Height:        General - Elderly Caucasian female, no apparent distress HEENT - PERRLA, EOMI, atraumatic head, non tender sinuses. Lung - Clear, basal rales, no rhonchi, wheezes. Heart - S1, S2 heard, no murmurs, rubs, trace pedal edema. Abdomen - Soft, non tender, bowel sounds good Neuro - Alert, awake and oriented x 3, non focal exam. Skin - Warm and dry.  Data Reviewed:      Latest Ref Rng &  Units 06/29/2024    6:01 AM 06/28/2024    1:12 PM 06/06/2024   11:33 AM  CBC  WBC 4.0 - 10.5 K/uL 6.5  6.0  7.0   Hemoglobin 12.0 - 15.0 g/dL 87.1  87.0  87.1   Hematocrit 36.0 - 46.0 % 40.8  41.2  39.1   Platelets 150 - 400 K/uL 216  231  217.0       Latest Ref Rng & Units 06/29/2024    6:01 AM 06/28/2024    1:12 PM 06/06/2024   11:33 AM  BMP  Glucose 70 - 99 mg/dL 839  856  863   BUN 8 - 23 mg/dL 20  21  25    Creatinine 0.44 - 1.00 mg/dL 8.91  8.81  8.67   Sodium 135 - 145 mmol/L 137  138  140   Potassium 3.5 - 5.1 mmol/L 4.4  4.5  4.3   Chloride 98 -  111 mmol/L 103  103  103   CO2 22 - 32 mmol/L 27  27  30    Calcium 8.9 - 10.3 mg/dL 9.6  9.7  9.9    ECHOCARDIOGRAM LIMITED Result Date: 06/29/2024    ECHOCARDIOGRAM LIMITED REPORT   Patient Name:   Briana [name] Date of Exam: 06/29/2024 Medical Rec #:  [id number]     Height:       60.0 in Accession #:    [id number]    Weight:       130.0 lb Date of Birth:  07/01/1934     BSA:          1.554 m Patient Age:    89 years      BP:           157/71 mmHg Patient Gender: F             HR:           60 bpm. Exam Location:  Inpatient Procedure: Limited Echo, Color Doppler and Cardiac Doppler (Both Spectral and            Color Flow Doppler were utilized during procedure). Indications:    hypertensive emergency  History:        Patient has prior history of Echocardiogram examinations, most                 recent 04/26/2024. CHF; Risk Factors:Hypertension, Dyslipidemia                 and Diabetes.  Sonographer:    Tinnie Barefoot RDCS Referring Phys: [id number] Briana [name] IMPRESSIONS  1. Left ventricular ejection fraction, by estimation, is 55 to 60%. The left ventricle has normal function. The left ventricle has no regional wall motion abnormalities. There is mild concentric left ventricular hypertrophy.  2. Right ventricular systolic function is normal. The right ventricular size is normal.  3. Left atrial size was severely dilated.  4. The mitral valve is grossly normal. Moderate mitral valve regurgitation. No evidence of mitral stenosis.  5. The aortic valve is tricuspid. Aortic valve regurgitation is trivial. No aortic stenosis is present.  6. The inferior vena cava is normal in size with greater than 50% respiratory variability, suggesting right atrial pressure of 3 mmHg. Comparison(s): No significant change from prior study. FINDINGS  Left Ventricle: Left ventricular ejection fraction, by estimation, is 55 to 60%. The left ventricle has normal function. The left ventricle has no regional wall motion abnormalities.  The left ventricular internal cavity size was normal in size.  There is  mild concentric left ventricular hypertrophy. Right Ventricle: The right ventricular size is normal. No increase in right ventricular wall thickness. Right ventricular systolic function is normal. Left Atrium: Left atrial size was severely dilated. Right Atrium: Right atrial size was normal in size. Pericardium: Trivial pericardial effusion is present. Mitral Valve: The mitral valve is grossly normal. Moderate mitral valve regurgitation. No evidence of mitral valve stenosis. Tricuspid Valve: The tricuspid valve is grossly normal. Tricuspid valve regurgitation is trivial. No evidence of tricuspid stenosis. Aortic Valve: The aortic valve is tricuspid. Aortic valve regurgitation is trivial. Aortic regurgitation PHT measures 524 msec. No aortic stenosis is present. Aorta: The aortic root and ascending aorta are structurally normal, with no evidence of dilitation. Venous: The inferior vena cava is normal in size with greater than 50% respiratory variability, suggesting right atrial pressure of 3 mmHg. IAS/Shunts: The atrial septum is grossly normal. Additional Comments: Spectral Doppler performed. Color Doppler performed.  LEFT VENTRICLE PLAX 2D LVIDd:         4.50 cm   Diastology LVIDs:         2.60 cm   LV e' medial:    6.99 cm/s LV PW:         1.30 cm   LV E/e' medial:  15.9 LV IVS:        1.30 cm   LV e' lateral:   10.10 cm/s LVOT diam:     1.90 cm   LV E/e' lateral: 11.0 LVOT Area:     2.84 cm  IVC IVC diam: 0.90 cm LEFT ATRIUM         Index LA diam:    4.90 cm 3.15 cm/m  AORTIC VALVE AI PHT:      524 msec  AORTA Ao Asc diam: 3.30 cm MITRAL VALVE MV Area (PHT): 2.87 cm     SHUNTS MV Decel Time: 264 msec     Systemic Diam: 1.90 cm MV E velocity: 111.00 cm/s MV A velocity: 38.60 cm/s MV E/A ratio:  2.88 Darryle Decent MD Electronically signed by Darryle Decent MD Signature Date/Time: 06/29/2024/5:23:55 PM    Final    CT Head Wo Contrast Result  Date: 06/28/2024 EXAM: CT HEAD WITHOUT CONTRAST 06/28/2024 01:25:31 PM TECHNIQUE: CT of the head was performed without the administration of intravenous contrast. Automated exposure control, iterative reconstruction, and/or weight based adjustment of the mA/kV was utilized to reduce the radiation dose to as low as reasonably achievable. COMPARISON: None available. CLINICAL HISTORY: Headache, new onset (Age >= 51y). Headache, hypertension. FINDINGS: BRAIN AND VENTRICLES: No acute hemorrhage. Gray-white differentiation is preserved. No hydrocephalus. No extra-axial collection. No mass effect or midline shift. Patchy to confluent hyperdensities in the cerebral white matter are nonspecific but compatible with severe chronic small vessel ischemic disease. Mild cerebral atrophy is within normal limits for age. ORBITS: Bilateral cataract extraction. SINUSES: No acute abnormality. SOFT TISSUES AND SKULL: No acute soft tissue abnormality. No skull fracture. Calcified atherosclerosis at the skull base. IMPRESSION: 1. No acute intracranial abnormality. 2. Severe chronic small vessel ischemic disease. Electronically signed by: Dasie Hamburg MD 06/28/2024 02:08 PM EDT RP Workstation: [id number]    Family Communication: Discussed with patient and family, understand and agree. All questions answered.  Disposition: Status is: Observation The patient remains OBS appropriate and will d/c before 2 midnights.  Planned Discharge Destination: Home with Home Health     Time spent: 42 minutes  Author: Concepcion Riser, MD 06/29/2024 8:46 PM Secure chat 7am to 7pm  For on call review [email or web address].

## 2024-06-29 NOTE — Evaluation (Signed)
 Physical Therapy Evaluation Patient Details Name: Briana [name] MRN: [id number] DOB: 14-May-1934 Today's Date: 06/29/2024  History of Present Illness  89 y.o. female presented 8/13 to emergency department with complaining of headache and elevated blood pressure. PMH: atrial flutter status post cardioversion in May 2025, first-degree AV block, recent GI bleed in June 2025 not on anticoagulation anymore since June 2025 as high risk of bleeding (due to advanced age also not a candidate for Watchman device), HFpEF 60 to 65%, CKD stage IIIb, essential hypertension, hyperlipidemia, history of prior GI bleed from chronic diverticulosis and chronic tremor-takes Valium  as needed  Clinical Impression  Pt admitted with above diagnosis. Mod I at baseline with use of SPC, recent fall in bathroom while trying to put her pants on. Now sits to dress safely. Mobilizing at a mod I to supervision level today. Feels close to functional baseline but still having headache. BP 164/93 prior to activity and 176/85 after mobilizing. She was able to toilet herself safely without issue. Will follow acutely. Family present, supportive, will transition pt back home and pt has friends that check on her regularly.  Pt currently with functional limitations due to the deficits listed below (see PT Problem List). Pt will benefit from acute skilled PT to increase their independence and safety with mobility to allow discharge.           If plan is discharge home, recommend the following: A little help with bathing/dressing/bathroom;Assistance with cooking/housework;Help with stairs or ramp for entrance;Assist for transportation   Can travel by private vehicle        Equipment Recommendations None recommended by PT  Recommendations for Other Services       Functional Status Assessment Patient has had a recent decline in their functional status and demonstrates the ability to make significant improvements in function in a reasonable  and predictable amount of time.     Precautions / Restrictions Precautions Precautions: Fall Recall of Precautions/Restrictions: Intact Restrictions Weight Bearing Restrictions Per Provider Order: No      Mobility  Bed Mobility Overal bed mobility: Modified Independent             General bed mobility comments: extra time    Transfers Overall transfer level: Needs assistance Equipment used: None Transfers: Sit to/from Stand Sit to Stand: Supervision           General transfer comment: Supervision for safety, requires hands to rise from bed. More stable with RW for support.    Ambulation/Gait Ambulation/Gait assistance: Supervision Gait Distance (Feet): 155 Feet Assistive device: Rolling walker (2 wheels), None Gait Pattern/deviations: Step-through pattern, Decreased stride length Gait velocity: dec Gait velocity interpretation: <1.8 ft/sec, indicate of risk for recurrent falls   General Gait Details: Grossly steady with RW for support, no overt LOB noted, no buckling. Educated on safe use, navigates around obstacles in hallway without issues and in narrow areas of room with minimal diffiuclty. Without AD short distance challenged by dynamic tasks like head turns and nods. Able to tolerate with slower speed and slight deviation from straight path.  Stairs            Wheelchair Mobility     Tilt Bed    Modified Rankin (Stroke Patients Only)       Balance Overall balance assessment: Needs assistance Sitting-balance support: No upper extremity supported, Feet supported Sitting balance-Leahy Scale: Normal     Standing balance support: No upper extremity supported, During functional activity Standing balance-Leahy Scale: Fair  Standardized Balance Assessment Standardized Balance Assessment : Berg Balance Test Berg Balance Test Sit to Stand: Able to stand  independently using hands Standing Unsupported: Able to stand safely  2 minutes Sitting with Back Unsupported but Feet Supported on Floor or Stool: Able to sit safely and securely 2 minutes Stand to Sit: Sits safely with minimal use of hands Transfers: Able to transfer safely, definite need of hands Standing Unsupported with Eyes Closed: Able to stand 10 seconds safely Standing Ubsupported with Feet Together: Able to place feet together independently and stand 1 minute safely From Standing, Reach Forward with Outstretched Arm: Can reach forward >12 cm safely (5) From Standing Position, Pick up Object from Floor: Able to pick up shoe, needs supervision From Standing Position, Turn to Look Behind Over each Shoulder: Turn sideways only but maintains balance Turn 360 Degrees: Able to turn 360 degrees safely but slowly Standing Unsupported, Alternately Place Feet on Step/Stool: Able to complete >2 steps/needs minimal assist Standing Unsupported, One Foot in Front: Needs help to step but can hold 15 seconds Standing on One Leg: Tries to lift leg/unable to hold 3 seconds but remains standing independently Total Score: 39         Pertinent Vitals/Pain Pain Assessment Pain Assessment: Faces Faces Pain Scale: Hurts little more Pain Location: headache Band like Pain Descriptors / Indicators: Aching Pain Intervention(s): Limited activity within patient's tolerance, Monitored during session, Repositioned    Home Living Family/patient expects to be discharged to:: Private residence Living Arrangements: Alone Available Help at Discharge: Neighbor;Family;Available PRN/intermittently (Daughter staying with pt temporarily, but lives in [place]) Type of Home: House Home Access: Stairs to enter Entrance Stairs-Rails: Left Entrance Stairs-Number of Steps: 2   Home Layout: One level Home Equipment: Other (comment);Grab bars - tub/shower;Hand held shower head;BSC/3in1;Rolling Walker (2 wheels);Wheelchair - manual;Cane - single point (Seat in bathroom to use after shower.)       Prior Function Prior Level of Function : Independent/Modified Independent;Driving;History of Falls (last six months)             Mobility Comments: using SPC, nearly all times. fell in restroom recently trying to put pants on, now sits down to dress. ADLs Comments: Ind, one fall in bathroom recently, now sits down to dress. drives, does her own shopping.     Extremity/Trunk Assessment   Upper Extremity Assessment Upper Extremity Assessment: Defer to OT evaluation    Lower Extremity Assessment Lower Extremity Assessment: Generalized weakness (No focal deficits noted in LEs.)       Communication   Communication Communication: No apparent difficulties    Cognition Arousal: Alert Behavior During Therapy: WFL for tasks assessed/performed   PT - Cognitive impairments: No apparent impairments                         Following commands: Intact       Cueing Cueing Techniques: Verbal cues     General Comments General comments (skin integrity, edema, etc.): Daughter present and supportive. BP 164/93 start of session. 176/85 end of session.    Exercises     Assessment/Plan    PT Assessment Patient needs continued PT services  PT Problem List Decreased strength;Decreased activity tolerance;Decreased balance;Decreased mobility;Decreased knowledge of use of DME;Cardiopulmonary status limiting activity;Pain       PT Treatment Interventions DME instruction;Gait training;Stair training;Functional mobility training;Therapeutic activities;Therapeutic exercise;Neuromuscular re-education;Balance training;Patient/family education    PT Goals (Current goals can be found in the Care Plan section)  Acute Rehab PT Goals Patient Stated Goal: go home PT Goal Formulation: With patient Time For Goal Achievement: 07/13/24 Potential to Achieve Goals: Good    Frequency Min 2X/week     Co-evaluation               AM-PAC PT 6 Clicks Mobility  Outcome Measure  Help needed turning from your back to your side while in a flat bed without using bedrails?: None Help needed moving from lying on your back to sitting on the side of a flat bed without using bedrails?: None Help needed moving to and from a bed to a chair (including a wheelchair)?: A Little Help needed standing up from a chair using your arms (e.g., wheelchair or bedside chair)?: A Little Help needed to walk in hospital room?: A Little Help needed climbing 3-5 steps with a railing? : A Little 6 Click Score: 20    End of Session Equipment Utilized During Treatment: Gait belt Activity Tolerance: Patient tolerated treatment well Patient left:  (In transport chair, going for ultrasound test) Nurse Communication: Mobility status PT Visit Diagnosis: Unsteadiness on feet (R26.81);Other abnormalities of gait and mobility (R26.89);History of falling (Z91.81);Muscle weakness (generalized) (M62.81);Pain Pain - part of body:  (Headache)    Time: 8455-8390 PT Time Calculation (min) (ACUTE ONLY): 25 min   Charges:   PT Evaluation $PT Eval Low Complexity: 1 Low PT Treatments $Gait Training: 8-22 mins PT General Charges $$ ACUTE PT VISIT: 1 Visit         Leontine Roads, PT, DPT [facility]  Rehabilitation Services Physical Therapist Office: [phone] Website: [email or web address]   Leontine GORMAN Roads 06/29/2024, 4:50 PM

## 2024-06-29 NOTE — ED Notes (Signed)
 6E notified of patient coming up

## 2024-06-29 NOTE — Care Management Obs Status (Signed)
 MEDICARE OBSERVATION STATUS NOTIFICATION   Patient Details  Name: Briana [name] MRN: [id number] Date of Birth: 1934-06-27   Medicare Observation Status Notification Given:  Yes    Vonzell Arrie Sharps 06/29/2024, 4:27 PM

## 2024-06-29 NOTE — Progress Notes (Signed)
  Echocardiogram 2D Echocardiogram has been performed.  Briana [name] 06/29/2024, 5:26 PM

## 2024-06-30 DIAGNOSIS — Z8679 Personal history of other diseases of the circulatory system: Secondary | ICD-10-CM | POA: Diagnosis not present

## 2024-06-30 DIAGNOSIS — I160 Hypertensive urgency: Secondary | ICD-10-CM | POA: Diagnosis not present

## 2024-06-30 DIAGNOSIS — I5032 Chronic diastolic (congestive) heart failure: Secondary | ICD-10-CM | POA: Diagnosis not present

## 2024-06-30 DIAGNOSIS — I440 Atrioventricular block, first degree: Secondary | ICD-10-CM | POA: Diagnosis not present

## 2024-06-30 MED ORDER — LOSARTAN POTASSIUM 50 MG PO TABS: 50.0000 mg | ORAL_TABLET | Freq: Every day | ORAL | 1 refills | Status: DC

## 2024-06-30 MED ORDER — AMLODIPINE BESYLATE 10 MG PO TABS: 10.0000 mg | ORAL_TABLET | Freq: Every day | ORAL | 1 refills | Status: DC

## 2024-06-30 NOTE — TOC Initial Note (Addendum)
 Transition of Care [facility]) - Initial/Assessment Note    Patient Details  Name: Briana [name] MRN: [id number] Date of Birth: 15-Sep-1934  Transition of Care [facility]) CM/SW Contact:    Sudie Erminio Deems, RN Phone Number: 06/30/2024, 9:35 AM  Clinical Narrative: Patient presented for hypertension. PTA patient was from home alone and has support of daughter that lives in [place] . Patient states she has a PCP and gets to appointment. Case Manager discussed Outpatient PT vs Home Health and the patient opted for home health. Patient did not have an agency preference- referral submitted to [facility]. Start of care to begin within 24-48 hours post transition home. Daughter will provide transportation home. No further needs identified at this time.                  Expected Discharge Plan: Home w Hospice Care Barriers to Discharge: No Barriers Identified   Patient Goals and CMS Choice Patient states their goals for this hospitalization and ongoing recovery are:: Plans to return home with [facility] Services   Choice offered to / list presented to : Patient (Patient wants to use a company that is in network)      Expected Discharge Plan and Services   Discharge Planning Services: CM Consult Post Acute Care Choice: Home Health Living arrangements for the past 2 months: Single Family Home Expected Discharge Date: 06/30/24                 DME Agency: NA       HH Arranged: PT HH Agency: [facility] Date HH Agency Contacted: 06/30/24 Time HH Agency Contacted: [phone] Representative spoke with at [facility] Agency: Amy  Prior Living Arrangements/Services Living arrangements for the past 2 months: Single Family Home Lives with:: Self (has support of daughter) Patient language and need for interpreter reviewed:: Yes Do you feel safe going back to the place where you live?: Yes      Need for Family Participation in Patient Care: No (Comment) Care giver support system in place?: No (comment)    Criminal Activity/Legal Involvement Pertinent to Current Situation/Hospitalization: No - Comment as needed  Activities of Daily Living      Permission Sought/Granted Permission sought to share information with : Case Manager, Family Supports Permission granted to share information with : Yes, Verbal Permission Granted     Emotional Assessment Appearance:: Appears stated age Attitude/Demeanor/Rapport: Engaged Affect (typically observed): Appropriate Orientation: : Oriented to Self, Oriented to Place, Oriented to  Time, Oriented to Situation Alcohol  / Substance Use: Not Applicable Psych Involvement: No (comment)  Admission diagnosis:  Hypertensive urgency [I16.0] Hypertensive emergency [I16.1] Patient Active Problem List   Diagnosis Date Noted   First degree AV block 06/28/2024   History of atrial fibrillation 06/28/2024   History of GI bleed 06/28/2024   CKD stage 3b, GFR 30-44 ml/min (HCC) 06/28/2024   Hyperlipidemia 06/28/2024   Hypertensive emergency 06/28/2024   Dysuria 06/06/2024   Medication management 06/06/2024   Senile purpura (HCC) 06/06/2024   Diverticula of colon 04/25/2024   Lower GI bleeding 04/24/2024   Balance problem 02/23/2024   Abnormal CBC 02/23/2024   Gait instability 02/23/2024   Chronic tremor 02/23/2024   Diverticulosis of intestine with bleeding 11/21/2023   Gastrointestinal hemorrhage 11/20/2023   Persistent atrial fibrillation (HCC) 06/07/2023   Atypical atrial flutter (HCC) 04/15/2023   Diverticular hemorrhage    Hyperparathyroidism due to renal insufficiency (HCC) (Elev PTH 140 on 03/05/2021  03/06/2021   Aortic atherosclerosis (HCC) by Chest  CTA on 11/21/2018.  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 07/01/2020   Hypercoagulable state due to persistent atrial fibrillation (HCC) 11/15/2019   Chronic diastolic CHF (congestive heart failure) (HCC) 04/03/2019   CKD stage 3 due to type 2 diabetes mellitus  (HCC) 02/23/2018   Bilateral sensorineural hearing loss 07/02/2017   Obesity (BMI 30.0-34.9) 09/05/2015   Lumbar stenosis 06/05/2015   GERD 11/20/2013   Osteopenia 11/20/2013   Essential hypertension 11/19/2013   Type 2 diabetes mellitus with hyperlipidemia (HCC) 11/19/2013   Vitamin D  deficiency 11/19/2013   Diffuse cystic mastopathy 11/19/2013   PCP:  [name], FNP Pharmacy:   [facility] [address]), [place] - [address] [place] ([place]) [place] [address] Phone: [phone] Fax: [phone]  Social Drivers of Health (SDOH) Social History: SDOH Screenings   Food Insecurity: No Food Insecurity (06/30/2024)  Housing: Low Risk  (06/30/2024)  Transportation Needs: No Transportation Needs (06/30/2024)  Utilities: Not At Risk (06/30/2024)  Alcohol  Screen: Low Risk  (05/15/2024)  Depression (PHQ2-9): Low Risk  (06/06/2024)  Recent Concern: Depression (PHQ2-9) - Medium Risk (04/28/2024)  Financial Resource Strain: Low Risk  (05/15/2024)  Physical Activity: Sufficiently Active (05/15/2024)  Social Connections: Moderately Integrated (06/30/2024)  Stress: No Stress Concern Present (05/15/2024)  Tobacco Use: Low Risk  (06/28/2024)  Health Literacy: Adequate Health Literacy (05/15/2024)   Readmission Risk Interventions     No data to display

## 2024-06-30 NOTE — Discharge Summary (Signed)
 Physician Discharge Summary   Patient: Briana [name] MRN: [id number] DOB: 1934/02/16  Admit date:     06/28/2024  Discharge date: 06/30/2024  Discharge Physician: Concepcion Riser   PCP: [name], FNP   Recommendations at discharge:    PCP follow up in 1 week. BP log for antihypertensive regimen titration. Coreg  stopped due to bradycardia.  Discharge Diagnoses: Principal Problem:   Hypertensive emergency Active Problems:   Chronic diastolic CHF (congestive heart failure) (HCC)   Chronic tremor   First degree AV block   History of atrial fibrillation   History of GI bleed   CKD stage 3b, GFR 30-44 ml/min (HCC)   Hyperlipidemia  Resolved Problems:   Hypertensive urgency  Hospital Course: [name] is a 89 y.o. female with medical history significant of atrial flutter status post cardioversion in May 2025, first-degree AV block, recent GI bleed in June 2025 not on anticoagulation anymore since June 2025 as high risk of bleeding (due to advanced age also not a candidate for Watchman device), HFpEF 60 to 65%, CKD stage IIIb, essential hypertension, hyperlipidemia, history of prior GI bleed from chronic diverticulosis and chronic tremor-takes Valium  as needed presented to emergency department with complaining of headache and elevated blood pressure.    Assessment and Plan: Hypertensive urgency Hold Coreg  in the setting of AV block and bradycardia. Tele and Echo unremarkable Started norvasc , losartan  therapy. Her BP better, headache improved. Advised to keep a log of BP for PCP to titrate antihypertensive regimen.   History of first-degree AV block History of paroxysmal defibrillation-noted anticoagulation Patient found to have active lower GI bleed and anemia secondary to diverticular bleed in June 2025.  Required blood transfusion.  During that time GI recommended to hold anticoagulation and given patient has advanced age not a candidate for any intervention.   Off xarelto  due to recurrent GI bleed.   Not a candidate for Watchman device due to multiple comorbidities and advanced age. In the setting of sinus bradycardia continue to hold Coreg .   History of GI bleed in June 2025 Concern for GI bleed in the past in the setting of diverticular bleeding.  Hb stable. Continue to monitor as outpatient.   CKD stage IIIb Creatinine 1.18 and GFR 44.  Renal function at baseline.   Avoid nephrotoxic agent.    Chronic tremor Continue Valium  as needed        Consultants: none Procedures performed: none  Disposition: Home health Diet recommendation:  Discharge Diet Orders (From admission, onward)     Start     Ordered   06/30/24 0000  Diet - low sodium heart healthy        06/30/24 0903           Cardiac diet DISCHARGE MEDICATION: Allergies as of 06/30/2024       Reactions   Codeine Rash   Accupril [quinapril Hcl] Cough   Prednisone     Agitated and shaky   Reglan [metoclopramide] Other (See Comments)   tremors   Tramadol  Nausea And Vomiting   Adhesive [tape] Rash   Neosporin [neomycin-bacitracin Zn-polymyx] Itching, Rash        Medication List     STOP taking these medications    carvedilol  3.125 MG tablet Commonly known as: COREG        TAKE these medications    albuterol  108 (90 Base) MCG/ACT inhaler Commonly known as: ProAir  HFA Take  2 inhalations  15 minutes apart  every 4 hours  as needed for  Wheezing   amiodarone  200 MG tablet Commonly known as: Pacerone  Take 1 tablet (200 mg total) by mouth daily.   amLODipine  10 MG tablet Commonly known as: NORVASC  Take 1 tablet (10 mg total) by mouth daily.   augmented betamethasone  dipropionate 0.05 % cream Commonly known as: DIPROLENE -AF Apply topically 2 (two) times daily.   cholecalciferol  25 MCG (1000 UNIT) tablet Commonly known as: VITAMIN D3 Take 3,000 Units by mouth daily.   diazepam  2 MG tablet Commonly known as: VALIUM  Take 1 tablet 3 - 4 x /day for  Tremor                                     /                                 TAKE                    BY                     MOUTH   furosemide  40 MG tablet Commonly known as: Lasix  Take 1 tablet (40 mg total) by mouth daily.   loratadine 10 MG tablet Commonly known as: CLARITIN Take 10 mg by mouth as needed for allergies.   losartan  50 MG tablet Commonly known as: COZAAR  Take 1 tablet (50 mg total) by mouth daily.   polyethylene glycol 17 g packet Commonly known as: MIRALAX  / GLYCOLAX  Take 17 g by mouth daily. What changed:  when to take this reasons to take this   ZINC PO Take 1 capsule by mouth daily.        Follow-up Information     [name], FNP Follow up in 1 week(s).   Specialty: Family Medicine Contact information: [address] [place] [address] [phone]                Discharge Exam: Fredricka Weights   06/28/24 1246  Weight: 59 kg      06/30/2024    9:09 AM 06/30/2024    5:06 AM 06/30/2024    2:19 AM  Vitals with BMI  Systolic 134 135 846  Diastolic 57 66 77  Pulse 65 61     General - Elderly Caucasian female, no apparent distress HEENT - PERRLA, EOMI, atraumatic head, non tender sinuses. Lung - Clear, basal rales, no rhonchi, wheezes. Heart - S1, S2 heard, no murmurs, rubs, trace pedal edema. Abdomen - Soft, non tender, bowel sounds good Neuro - Alert, awake and oriented x 3, non focal exam. Skin - Warm and dry.  Condition at discharge: stable  The results of significant diagnostics from this hospitalization (including imaging, microbiology, ancillary and laboratory) are listed below for reference.   Imaging Studies: ECHOCARDIOGRAM LIMITED Result Date: 06/29/2024    ECHOCARDIOGRAM LIMITED REPORT   Patient Name:   Briana [name] Date of Exam: 06/29/2024 Medical Rec #:  [id number]     Height:       60.0 in Accession #:    [id number]    Weight:       130.0 lb Date of Birth:  1934-03-09     BSA:          1.554  m Patient Age:    89 years      BP:  157/71 mmHg Patient Gender: F             HR:           60 bpm. Exam Location:  Inpatient Procedure: Limited Echo, Color Doppler and Cardiac Doppler (Both Spectral and            Color Flow Doppler were utilized during procedure). Indications:    hypertensive emergency  History:        Patient has prior history of Echocardiogram examinations, most                 recent 04/26/2024. CHF; Risk Factors:Hypertension, Dyslipidemia                 and Diabetes.  Sonographer:    Tinnie Barefoot RDCS Referring Phys: [id number] SUBRINA SUNDIL IMPRESSIONS  1. Left ventricular ejection fraction, by estimation, is 55 to 60%. The left ventricle has normal function. The left ventricle has no regional wall motion abnormalities. There is mild concentric left ventricular hypertrophy.  2. Right ventricular systolic function is normal. The right ventricular size is normal.  3. Left atrial size was severely dilated.  4. The mitral valve is grossly normal. Moderate mitral valve regurgitation. No evidence of mitral stenosis.  5. The aortic valve is tricuspid. Aortic valve regurgitation is trivial. No aortic stenosis is present.  6. The inferior vena cava is normal in size with greater than 50% respiratory variability, suggesting right atrial pressure of 3 mmHg. Comparison(s): No significant change from prior study. FINDINGS  Left Ventricle: Left ventricular ejection fraction, by estimation, is 55 to 60%. The left ventricle has normal function. The left ventricle has no regional wall motion abnormalities. The left ventricular internal cavity size was normal in size. There is  mild concentric left ventricular hypertrophy. Right Ventricle: The right ventricular size is normal. No increase in right ventricular wall thickness. Right ventricular systolic function is normal. Left Atrium: Left atrial size was severely dilated. Right Atrium: Right atrial size was normal in size. Pericardium: Trivial  pericardial effusion is present. Mitral Valve: The mitral valve is grossly normal. Moderate mitral valve regurgitation. No evidence of mitral valve stenosis. Tricuspid Valve: The tricuspid valve is grossly normal. Tricuspid valve regurgitation is trivial. No evidence of tricuspid stenosis. Aortic Valve: The aortic valve is tricuspid. Aortic valve regurgitation is trivial. Aortic regurgitation PHT measures 524 msec. No aortic stenosis is present. Aorta: The aortic root and ascending aorta are structurally normal, with no evidence of dilitation. Venous: The inferior vena cava is normal in size with greater than 50% respiratory variability, suggesting right atrial pressure of 3 mmHg. IAS/Shunts: The atrial septum is grossly normal. Additional Comments: Spectral Doppler performed. Color Doppler performed.  LEFT VENTRICLE PLAX 2D LVIDd:         4.50 cm   Diastology LVIDs:         2.60 cm   LV e' medial:    6.99 cm/s LV PW:         1.30 cm   LV E/e' medial:  15.9 LV IVS:        1.30 cm   LV e' lateral:   10.10 cm/s LVOT diam:     1.90 cm   LV E/e' lateral: 11.0 LVOT Area:     2.84 cm  IVC IVC diam: 0.90 cm LEFT ATRIUM         Index LA diam:    4.90 cm 3.15 cm/m  AORTIC VALVE AI PHT:  524 msec  AORTA Ao Asc diam: 3.30 cm MITRAL VALVE MV Area (PHT): 2.87 cm     SHUNTS MV Decel Time: 264 msec     Systemic Diam: 1.90 cm MV E velocity: 111.00 cm/s MV A velocity: 38.60 cm/s MV E/A ratio:  2.88 Darryle Decent MD Electronically signed by Darryle Decent MD Signature Date/Time: 06/29/2024/5:23:55 PM    Final    CT Head Wo Contrast Result Date: 06/28/2024 EXAM: CT HEAD WITHOUT CONTRAST 06/28/2024 01:25:31 PM TECHNIQUE: CT of the head was performed without the administration of intravenous contrast. Automated exposure control, iterative reconstruction, and/or weight based adjustment of the mA/kV was utilized to reduce the radiation dose to as low as reasonably achievable. COMPARISON: None available. CLINICAL HISTORY: Headache,  new onset (Age >= 51y). Headache, hypertension. FINDINGS: BRAIN AND VENTRICLES: No acute hemorrhage. Gray-white differentiation is preserved. No hydrocephalus. No extra-axial collection. No mass effect or midline shift. Patchy to confluent hyperdensities in the cerebral white matter are nonspecific but compatible with severe chronic small vessel ischemic disease. Mild cerebral atrophy is within normal limits for age. ORBITS: Bilateral cataract extraction. SINUSES: No acute abnormality. SOFT TISSUES AND SKULL: No acute soft tissue abnormality. No skull fracture. Calcified atherosclerosis at the skull base. IMPRESSION: 1. No acute intracranial abnormality. 2. Severe chronic small vessel ischemic disease. Electronically signed by: Dasie Hamburg MD 06/28/2024 02:08 PM EDT RP Workstation: [id number]   MM 3D SCREENING MAMMOGRAM BILATERAL BREAST Result Date: 06/13/2024 CLINICAL DATA:  Screening. EXAM: DIGITAL SCREENING BILATERAL MAMMOGRAM WITH TOMOSYNTHESIS AND CAD TECHNIQUE: Bilateral screening digital craniocaudal and mediolateral oblique mammograms were obtained. Bilateral screening digital breast tomosynthesis was performed. The images were evaluated with computer-aided detection. COMPARISON:  Previous exam(s). ACR Breast Density Category b: There are scattered areas of fibroglandular density. FINDINGS: In the left breast, a possible asymmetry warrants further evaluation. In the right breast, no findings suspicious for malignancy. IMPRESSION: Further evaluation is suggested for possible asymmetry in the left breast. RECOMMENDATION: Diagnostic mammogram and possibly ultrasound of the left breast. (Code:[id number]) The patient will be contacted regarding the findings, and additional imaging will be scheduled. BI-RADS CATEGORY  0: Incomplete: Need additional imaging evaluation. Electronically Signed   By: Inocente Ast M.D.   On: 06/13/2024 13:02    Microbiology: Results for orders placed or performed in visit on  06/22/24  Urine Culture     Status: None   Collection Time: 06/22/24 11:03 AM   Specimen: Urine  Result Value Ref Range Status   Source: NOT GIVEN  Final   Status: FINAL  Final   Result: No Growth  Final    Labs: CBC: Recent Labs  Lab 06/28/24 1312 06/29/24 0601  WBC 6.0 6.5  HGB 12.9 12.8  HCT 41.2 40.8  MCV 95.2 94.2  PLT 231 216   Basic Metabolic Panel: Recent Labs  Lab 06/28/24 1312 06/29/24 0601  NA 138 137  K 4.5 4.4  CL 103 103  CO2 27 27  GLUCOSE 143* 160*  BUN 21 20  CREATININE 1.18* 1.08*  CALCIUM 9.7 9.6   Liver Function Tests: Recent Labs  Lab 06/28/24 1312 06/29/24 0601  AST 24 24  ALT 20 19  ALKPHOS 88 83  BILITOT 0.9 0.8  PROT 6.4* 6.3*  ALBUMIN  3.8 3.6   CBG: No results for input(s): GLUCAP in the last 168 hours.  Discharge time spent: 34 minutes.  Signed: Concepcion Riser, MD [facility] 06/30/2024

## 2024-06-30 NOTE — Progress Notes (Signed)
 Transportation discharged at 1029am.

## 2024-06-30 NOTE — Progress Notes (Signed)
 AVS and education completed with patient and daughter.  Healthy Heart diet and Mediterranean diet pamphlet printed and given to family.   PIV removed.  Both patient and daughter expressed verbal understanding of discharge plan of care.     Transportation called at 1014 for discharge

## 2024-07-03 ENCOUNTER — Telehealth: Payer: Self-pay

## 2024-07-03 NOTE — Transitions of Care (Post Inpatient/ED Visit) (Signed)
 07/03/2024  Name: Briana [name] MRN: [id number] DOB: May 15, 1934  Today's TOC FU Call Status: Today's TOC FU Call Status:: Successful TOC FU Call Completed TOC FU Call Complete Date: 07/03/24 Patient's Name and Date of Birth confirmed.  Transition Care Management Follow-up Telephone Call Date of Discharge: 06/30/24 Discharge Facility: [facility]) Type of Discharge: Inpatient Admission Primary Inpatient Discharge Diagnosis:: hypertension How have you been since you were released from the hospital?: Better Any questions or concerns?: No  Items Reviewed: Did you receive and understand the discharge instructions provided?: Yes Medications obtained,verified, and reconciled?: Yes (Medications Reviewed) Any new allergies since your discharge?: No Dietary orders reviewed?: Yes Do you have support at home?: Yes People in Home [RPT]: child(ren), adult  Medications Reviewed Today: Medications Reviewed Today     Reviewed by Emmitt Pan, LPN (Licensed Practical Nurse) on 07/03/24 at 1426  Med List Status: <None>   Medication Order Taking? Sig Documenting Provider Last Dose Status Informant  albuterol  (PROAIR  HFA) 108 (90 Base) MCG/ACT inhaler [id number] Yes Take  2 inhalations  15 minutes apart  every 4 hours  as needed for Wheezing Tonita Fallow, MD  Active Pharmacy Records, Child           Med Note [facility], [name]   Wed Jun 28, 2024 10:22 PM)    amiodarone  (PACERONE ) 200 MG tablet [id number] Yes Take 1 tablet (200 mg total) by mouth daily. [name], FNP  Active Pharmacy Records, Child  amLODipine  (NORVASC ) 10 MG tablet [id number] Yes Take 1 tablet (10 mg total) by mouth daily. Darci Pore, MD  Active   augmented betamethasone  dipropionate (DIPROLENE -AF) 0.05 % cream [id number] Yes Apply topically 2 (two) times daily. [provider]  Active Pharmacy Records, Child  cholecalciferol  (VITAMIN D3) 25 MCG (1000 UNIT) tablet [id number] Yes Take 3,000 Units  by mouth daily. [provider]  Active Pharmacy Records, Child  diazepam  (VALIUM ) 2 MG tablet [id number]  Take 1 tablet 3 - 4 x /day for Tremor                                     /                                 TAKE                    BY                     MOUTH  Patient not taking: Reported on 07/03/2024   Tonita Fallow, MD  Active Pharmacy Records, Child  furosemide  (LASIX ) 40 MG tablet [id number] Yes Take 1 tablet (40 mg total) by mouth daily. [name], FNP  Active Pharmacy Records, Child  loratadine (CLARITIN) 10 MG tablet [id number] Yes Take 10 mg by mouth as needed for allergies. [provider]  Active Pharmacy Records, Child  losartan  (COZAAR ) 50 MG tablet [id number] Yes Take 1 tablet (50 mg total) by mouth daily. Darci Pore, MD  Active   Multiple Vitamins-Minerals (ZINC PO) [id number] Yes Take 1 capsule by mouth daily. [provider]  Active Pharmacy Records, Child  polyethylene glycol (MIRALAX  / GLYCOLAX ) 17 g packet [id number] Yes Take 17 g by mouth daily.  Patient taking differently: Take 17 g by mouth as needed.   Akula, Vijaya,  MD  Active Pharmacy Records, Child            Home Care and Equipment/Supplies: Were Home Health Services Ordered?: Yes Name of Home Health Agency:: unknown Has Agency set up a time to come to your home?: Yes First Home Health Visit Date: 07/03/24 Any new equipment or medical supplies ordered?: NA  Functional Questionnaire: Do you need assistance with bathing/showering or dressing?: No Do you need assistance with meal preparation?: No Do you need assistance with eating?: No Do you have difficulty maintaining continence: No Do you need assistance with getting out of bed/getting out of a chair/moving?: No Do you have difficulty managing or taking your medications?: No  Follow up appointments reviewed: PCP Follow-up appointment confirmed?: Yes Date of PCP follow-up appointment?:  07/06/24 Follow-up Provider: [facility] Follow-up appointment confirmed?: Yes Date of Specialist follow-up appointment?: 07/04/24 Follow-Up Specialty Provider:: cardio Do you need transportation to your follow-up appointment?: No Do you understand care options if your condition(s) worsen?: Yes-patient verbalized understanding    SIGNATURE Julian Lemmings, LPN [facility] Nurse Health Advisor Direct Dial [phone]

## 2024-07-03 NOTE — Telephone Encounter (Signed)
 Verbals given.  Briana [name]

## 2024-07-03 NOTE — Telephone Encounter (Signed)
 Copied from CRM #[id number]. Topic: Clinical - Home Health Verbal Orders >> Jul 03, 2024 12:35 PM Pinkey ORN wrote: Caller/Agency: [facility] Callback Number: [phone] Service Requested: Physical Therapy Frequency: 2 Week 3 , 1 Week 1  Any new concerns about the patient? No

## 2024-07-04 ENCOUNTER — Inpatient Hospital Stay: Admitting: Family Medicine

## 2024-07-04 ENCOUNTER — Encounter: Payer: Self-pay | Admitting: Cardiology

## 2024-07-04 ENCOUNTER — Other Ambulatory Visit

## 2024-07-04 ENCOUNTER — Ambulatory Visit
Admission: RE | Admit: 2024-07-04 | Discharge: 2024-07-04 | Disposition: A | Discharge status: Home / Self Care | Source: Ambulatory Visit | Attending: Family Medicine | Admitting: Family Medicine

## 2024-07-04 ENCOUNTER — Ambulatory Visit: Attending: Cardiology | Admitting: Cardiology

## 2024-07-04 VITALS — BP 165/73 | HR 63 | Ht 60.0 in | Wt 128.0 lb

## 2024-07-04 DIAGNOSIS — K922 Gastrointestinal hemorrhage, unspecified: Secondary | ICD-10-CM | POA: Diagnosis not present

## 2024-07-04 DIAGNOSIS — I4819 Other persistent atrial fibrillation: Secondary | ICD-10-CM | POA: Diagnosis not present

## 2024-07-04 DIAGNOSIS — D6869 Other thrombophilia: Secondary | ICD-10-CM

## 2024-07-04 NOTE — Patient Instructions (Signed)
 Medication Instructions:  Your physician recommends that you continue on your current medications as directed. Please refer to the Current Medication list given to you today.  *If you need a refill on your cardiac medications before your next appointment, please call your pharmacy*  Testing/Procedures: Watchman Your physician has requested that you have Left atrial appendage (LAA) closure device implantation is a procedure to put a small device in the LAA of the heart. The LAA is a small sac in the wall of the heart's left upper chamber. Blood clots can form in this area. The device, Watchman closes the LAA to help prevent a blood clot and stroke.   Follow-Up: At [facility], you and your health needs are our priority.  As part of our continuing mission to provide you with exceptional heart care, our providers are all part of one team.  This team includes your primary Cardiologist (physician) and Advanced Practice Providers or APPs (Physician Assistants and Nurse Practitioners) who all work together to provide you with the care you need, when you need it.  Please call us  if you would like to proceed with scheduling.

## 2024-07-04 NOTE — Progress Notes (Unsigned)
 Electrophysiology Office Note:   Date:  07/05/2024  ID:  Briana [name], DOB January 07, 1934, MRN [id number]  Primary Cardiologist: None Electrophysiologist: Briana Kitty, MD      History of Present Illness:   Briana [name] is [name] 89 y.[name]. female with [name] HTN, HLD, DM, chronic HFpEF, atrial fibrillation who is being seen today for evaluation for Watchman device implant.   Discussed the use of AI scribe software for clinical note transcription with the patient, who gave verbal consent to proceed.  History of Present Illness Briana [name] is an 89 year old female with atrial fibrillation who presents for evaluation of bleeding complications from blood thinners. She was referred by Briana Kicks, PA for evaluation of atrial fibrillation and bleeding issues related to blood thinner use. Patient was admitted after presenting to the ED with GI bleeding 04/24/24. Her Xarelto  was held but she did not require transfusion. CT angio showed no evidence of active GI bleed. She has been on blood thinners such as Eliquis  and Xarelto  to mitigate this risk. However, she has experienced significant bleeding complications, including gastrointestinal bleeding, even at reduced doses, which has led to discontinuation of these medications.  She has had multiple hospitalizations for recurrent GI bleeding.  She is not currently taking any oral anticoagulation. She has [name] history of undergoing cardioversion procedures for her atrial fibrillation and has tolerated the anesthesia well during these procedures.    Review of systems complete and found to be negative unless listed in HPI.   EP Information / Studies Reviewed:    EKG is not ordered today. EKG from 06/28/24 reviewed which showed SB with RBBB.     EKG 03/21/24: AFL   Echo 06/29/24:   1. Left ventricular ejection fraction, by estimation, is 55 to 60%. The  left ventricle has normal function. The left ventricle has no regional  wall motion abnormalities. There is mild  concentric left ventricular  hypertrophy.   2. Right ventricular systolic function is normal. The right ventricular  size is normal.   3. Left atrial size was severely dilated.   4. The mitral valve is grossly normal. Moderate mitral valve  regurgitation. No evidence of mitral stenosis.   5. The aortic valve is tricuspid. Aortic valve regurgitation is trivial.  No aortic stenosis is present.   6. The inferior vena cava is normal in size with greater than 50%  respiratory variability, suggesting right atrial pressure of 3 mmHg.    Risk Assessment/Calculations:    CHA2DS2-VASc Score = 6   This indicates [name] 9.7% annual risk of stroke. The patient'[name] score is based upon: CHF History: 1 HTN History: 1 Diabetes History: 1 Stroke History: 0 Vascular Disease History: 0 Age Score: 2 Gender Score: 1         Physical Exam:   VS:  BP (!) 165/73   Pulse 63   Ht 5' (1.524 [name])   Wt 128 lb (58.1 kg)   SpO2 93%   BMI 25.00 kg/[name]    Wt Readings from Last 3 Encounters:  07/04/24 128 lb (58.1 kg)  06/28/24 130 lb (59 kg)  06/06/24 132 lb 9.6 oz (60.1 kg)     GEN: Well nourished, well developed in no acute distress NECK: No JVD CARDIAC: Normal rate, regular rhythm. RESPIRATORY:  Clear to auscultation without rales, wheezing or rhonchi  ABDOMEN: Soft, non-distended EXTREMITIES:  No edema; No deformity   ASSESSMENT AND PLAN:   I have seen Briana [name] in the office today  who is being considered for [name] Watchman left atrial appendage closure device. I believe they will benefit from this procedure given their history of atrial fibrillation, CHA2DS2-VASc score of 6 and unadjusted ischemic stroke rate of 9.7% per year. Unfortunately, the patient is not felt to be [name] long term anticoagulation candidate secondary to recurrent GI bleeding. The patient'[name] chart has been reviewed and I feel that they would be [name] candidate for short term oral anticoagulation after Watchman implant.   It is my belief that  after undergoing [name] LAA closure procedure, Briana [name] will not need long term anticoagulation which eliminates anticoagulation side effects and major bleeding risk.   Procedural risks for the Watchman implant have been reviewed with the patient including [name] 0.5% risk of stroke, <1% risk of perforation and <1% risk of device embolization. Other risks include bleeding, vascular damage, tamponade, worsening renal function, and death. The patient understands these risk and wishes to proceed.     The published clinical data on the safety and effectiveness of WATCHMAN include but are not limited to the following: - Briana [name], Briana [name], Briana [name] et al. for the PROTECT AF Investigators. Percutaneous closure of the left atrial appendage versus warfarin therapy for prevention of stroke in patients with atrial fibrillation: [name] randomised non-inferiority trial. Lancet 2009; 374: 534-42. Briana [name], Briana [name], Briana [name] et al. on behalf of the PROTECT AF Investigators. Percutaneous Left Atrial Appendage Closure for Stroke Prophylaxis in Patients With Atrial Fibrillation 2.3-Year Follow-up of the PROTECT AF (Watchman Left Atrial Appendage System for Embolic Protection in Patients With Atrial Fibrillation) Trial. Circulation 2013; 127:720-729. - Briana [name], Briana [name],  Briana [name], Briana [name], Briana [name] et al. Quality of Life Assessment in the Randomized PROTECT AF (Percutaneous Closure of the Left Atrial Appendage Versus Warfarin Therapy for Prevention of Stroke in Patients With Atrial Fibrillation) Trial of Patients at Risk for Stroke With Nonvalvular Atrial Fibrillation. [name] Am Coll Cardiol 2013; 61:1790-8. Briana [name], Briana [name], Briana [name], Briana [name], Briana [name], Briana [name], Briana [name] K, Briana V. Prospective randomized evaluation of the Watchman left atrial appendage Device in patients with atrial fibrillation versus long-term warfarin therapy; the PREVAIL trial. Journal of the [employer] of Cardiology, Vol. 4, No. 1, 2014, 1-11. -  Briana [name], Briana [name], Briana [name], Briana [name], Briana [name] et al. Primary outcome evaluation of [name] next-generation left atrial appendage closure device: results from the PINNACLE FLX trial. Circulation 2021;143(18)1754-1762.   HAS-BLED score 3 Hypertension Yes  Abnormal renal and liver function (Dialysis, transplant, Cr >2.26 mg/dL /Cirrhosis or Bilirubin >2x Normal or AST/ALT/AP >3x Normal) No  Stroke No  Bleeding Yes  Labile INR (Unstable/high INR) No  Elderly (>65) Yes  Drugs or alcohol  (>= 8 drinks/week, anti-plt or NSAID) No   CHA2DS2-VASc Score = 6  The patient'[name] score is based upon: CHF History: 1 - NYHA class II HTN History: 1 Diabetes History: 1 Stroke History: 0 Vascular Disease History: 0 Age Score: 2 Gender Score: 1       ASSESSMENT AND PLAN: After today'[name] visit with the patient which was dedicated solely for shared decision making visit regarding LAA closure device, the patient and her family would like to think about their options.  If she chooses to pursue left atrial appendage occlusion, then I would like to obtain [name] gated CT scan of the chest with contrast timed for PV/LA visualization.   Persistent Atrial Fibrillation (ICD10:  I48.19) The patient'[name] CHA2DS2-VASc score  is 6, indicating [name] 9.7% annual risk of stroke. Continue amiodarone .  Secondary Hypercoagulable State (ICD10:  D68.69) The patient is at significant risk for stroke/thromboembolism based upon her CHA2DS2-VASc Score of 6.  However, the patient is not on anticoagulation due to her high bleeding risk.  If she desires watchman implantation, then we would need to start dual antiplatelet therapy with aspirin  and Plavix at time of implant.  She has had bleeding on reduced dose Xarelto  and Eliquis  already leading to discontinuation.   Follow up with [name]. Kennyth if Watchman device is pursued.   Signed, Briana Kennyth, MD

## 2024-07-06 ENCOUNTER — Encounter: Payer: Self-pay | Admitting: Family Medicine

## 2024-07-06 ENCOUNTER — Ambulatory Visit: Payer: Self-pay | Admitting: Family Medicine

## 2024-07-06 ENCOUNTER — Ambulatory Visit: Admitting: Family Medicine

## 2024-07-06 VITALS — BP 140/68 | HR 61 | Temp 98.4°F | Ht 60.0 in | Wt 131.6 lb

## 2024-07-06 DIAGNOSIS — I4819 Other persistent atrial fibrillation: Secondary | ICD-10-CM

## 2024-07-06 DIAGNOSIS — I5032 Chronic diastolic (congestive) heart failure: Secondary | ICD-10-CM

## 2024-07-06 DIAGNOSIS — I10 Essential (primary) hypertension: Secondary | ICD-10-CM

## 2024-07-06 DIAGNOSIS — E1169 Type 2 diabetes mellitus with other specified complication: Secondary | ICD-10-CM

## 2024-07-06 DIAGNOSIS — I4811 Longstanding persistent atrial fibrillation: Secondary | ICD-10-CM

## 2024-07-06 DIAGNOSIS — D6869 Other thrombophilia: Secondary | ICD-10-CM | POA: Insufficient documentation

## 2024-07-06 LAB — CBC WITH DIFFERENTIAL/PLATELET
Basophils Absolute: 0 K/uL (ref 0.0–0.1)
Basophils Relative: 0.6 % (ref 0.0–3.0)
Eosinophils Absolute: 0.1 K/uL (ref 0.0–0.7)
Eosinophils Relative: 1.1 % (ref 0.0–5.0)
HCT: 40.8 % (ref 36.0–46.0)
Hemoglobin: 13.2 g/dL (ref 12.0–15.0)
Lymphocytes Relative: 16.1 % (ref 12.0–46.0)
Lymphs Abs: 0.9 K/uL (ref 0.7–4.0)
MCHC: 32.5 g/dL (ref 30.0–36.0)
MCV: 91.3 fl (ref 78.0–100.0)
Monocytes Absolute: 0.6 K/uL (ref 0.1–1.0)
Monocytes Relative: 10.2 % (ref 3.0–12.0)
Neutro Abs: 4 K/uL (ref 1.4–7.7)
Neutrophils Relative %: 72 % (ref 43.0–77.0)
Platelets: 208 K/uL (ref 150.0–400.0)
RBC: 4.47 Mil/uL (ref 3.87–5.11)
RDW: 14.9 % (ref 11.5–15.5)
WBC: 5.5 K/uL (ref 4.0–10.5)

## 2024-07-06 LAB — COMPREHENSIVE METABOLIC PANEL WITH GFR
ALT: 17 U/L (ref 0–35)
AST: 20 U/L (ref 0–37)
Albumin: 4.3 g/dL (ref 3.5–5.2)
Alkaline Phosphatase: 95 U/L (ref 39–117)
BUN: 30 mg/dL — ABNORMAL HIGH (ref 6–23)
CO2: 28 meq/L (ref 19–32)
Calcium: 9.7 mg/dL (ref 8.4–10.5)
Chloride: 102 meq/L (ref 96–112)
Creatinine, Ser: 1.45 mg/dL — ABNORMAL HIGH (ref 0.40–1.20)
GFR: 32.18 mL/min — ABNORMAL LOW (ref >60.00)
Glucose, Bld: 184 mg/dL — ABNORMAL HIGH (ref 70–99)
Potassium: 4.4 meq/L (ref 3.5–5.1)
Sodium: 138 meq/L (ref 135–145)
Total Bilirubin: 0.6 mg/dL (ref 0.2–1.2)
Total Protein: 6.7 g/dL (ref 6.0–8.3)

## 2024-07-06 NOTE — Progress Notes (Signed)
 Acute Office Visit  Subjective:     Patient ID: Briana [name], female    DOB: [birth year], 89 y.o.   MRN: [id number]  Chief Complaint  Patient presents with   Hospitalization Follow-up    HPI  Discussed the use of AI scribe software for clinical note transcription with the patient, who gave verbal consent to proceed.  History of Present Illness An 89 year old female with hypertension presents for blood pressure management. Presents with her daughter who is supplementing history.  Blood pressure fluctuations and associated symptoms - Blood pressure has been fluctuating, with a recent high of 201/90 mmHg on June 28, 2024, prompting a hospital visit - Lowest recorded blood pressure was 158/69 mmHg on June 28, 2024 - Consistently taking antihypertensive medication since June 30, 2024, after hospital discharge - Occasional lightheadedness when standing quickly - No headaches, dizziness, or lower extremity edema  Cutaneous lesion with delayed healing - Skin lesion to anterior right thigh previously treated by dermatology approximately one to two months ago has not healed as expected - Other similar lesions have healed by crusting and falling off  Supplement use and discontinuation - Currently taking a multivitamin and extra vitamin D3 - Discontinued B12 and iron supplements after hemoglobin measured at 12.8 g/dL on June 29, 2024  HFU, [facility] admission from 06/28/24-06/30/24 for hypertensive urgency. TOC call 07/03/24     ROS Per HPI      Objective:    BP (!) 140/68 (BP Location: Left Arm, Patient Position: Sitting)   Pulse 61   Temp 98.4 F (36.9 C) (Temporal)   Ht 5' (1.524 m)   Wt 131 lb 9.6 oz (59.7 kg)   SpO2 95%   BMI 25.70 kg/m    Physical Exam Vitals and nursing note reviewed.  Constitutional:      General: She is not in acute distress.    Appearance: She is normal weight.     Comments: elderly  HENT:     Head: Normocephalic and atraumatic.      Right Ear: External ear normal.     Left Ear: External ear normal.     Nose: Nose normal.     Mouth/Throat:     Mouth: Mucous membranes are moist.     Pharynx: Oropharynx is clear.  Eyes:     Extraocular Movements: Extraocular movements intact.     Pupils: Pupils are equal, round, and reactive to light.  Cardiovascular:     Rate and Rhythm: Normal rate and regular rhythm.     Pulses: Normal pulses.     Heart sounds: Normal heart sounds.  Pulmonary:     Effort: Pulmonary effort is normal. No respiratory distress.     Breath sounds: Normal breath sounds. No wheezing, rhonchi or rales.  Musculoskeletal:     Cervical back: Normal range of motion.     Right lower leg: No edema.     Left lower leg: No edema.     Comments: Using single point cane  Lymphadenopathy:     Cervical: No cervical adenopathy.  Neurological:     General: No focal deficit present.     Mental Status: She is alert and oriented to person, place, and time.  Psychiatric:        Mood and Affect: Mood normal.        Thought Content: Thought content normal.     No results found for any visits on 07/06/24.      Assessment & Plan:   Assessment and Plan  Assessment & Plan Essential hypertension Blood pressure well-managed with systolic 140-150, diastolic normal. No dizziness or headaches. Avoid excessively low blood pressure. - Monitor blood pressure regularly, especially if dizziness occurs. - Maintain current medication regimen. - Advise her to march in place before standing to prevent dizziness. - Schedule follow-up in October, with interim visits if needed.  Persistent A-fib/Hypercoagulability - Watchman device discussed with cardiology - Still deciding whether to do this since she cannot tolerate blood thinners - Rhythm stable today - Continue amiodarone   Chronic skin lesion, suspicious for recurrence Lesion previously treated by dermatology, possibly with cryotherapy. Current lesion suspicious for  recurrence. - Refer to dermatology for evaluation and possible biopsy of the lesion.     Orders Placed This Encounter  Procedures   CBC with Differential/Platelet    Release to patient:   Immediate [1]   Comprehensive metabolic panel with GFR    Release to patient:   Immediate [1]     No orders of the defined types were placed in this encounter.   Return for as scheduled.  Corean LITTIE Ku, FNP

## 2024-07-06 NOTE — Patient Instructions (Signed)
 Continue current blood pressure medication regimen.  Continue to monitor blood pressures at home.   Please go to the ER for further evaluation for symptomatic blood pressures greater than 160/110.  We are checking labs today, will be in contact with any results that require further attention  Follow up with me as scheduled, sooner if needed

## 2024-07-12 ENCOUNTER — Telehealth: Payer: Self-pay

## 2024-07-12 DIAGNOSIS — K922 Gastrointestinal hemorrhage, unspecified: Secondary | ICD-10-CM

## 2024-07-12 DIAGNOSIS — I4819 Other persistent atrial fibrillation: Secondary | ICD-10-CM

## 2024-07-12 NOTE — Telephone Encounter (Signed)
 The patient's daughter called and said they have thought about it and her mother wishes to proceed with LAAO. Reiterated she will need cCT prior to procedure to ensure her anatomy is suitable for the device. Attempted several dates to schedule CT appointment but her daughter does not know her schedule. She will call back once she speaks with her sister to discuss transportation. She was grateful for assistance.

## 2024-07-12 NOTE — Addendum Note (Signed)
 Addended by: Alaiza Yau A on: 07/12/2024 02:48 PM   Modules accepted: Orders

## 2024-07-14 ENCOUNTER — Ambulatory Visit: Payer: Self-pay | Admitting: *Deleted

## 2024-07-14 NOTE — Telephone Encounter (Signed)
LVM for patients daughter

## 2024-07-14 NOTE — Telephone Encounter (Signed)
 Copied from CRM [id number]. Topic: Clinical - Red Word Triage >> Jul 14, 2024 11:30 AM Thersia BROCKS wrote: [facility] that prompted transfer to Nurse Triage: Madeleine Colorado, physical therapist assistant called in reporting that patient is complaining about abdominal pain Reason for Disposition  [1] MILD-MODERATE pain AND [2] comes and goes (cramps)    [facility] home health PT assistant calling in abd pain and bloating 6/10 on pain scale.    Now her pain is 4/10 after taking Gas X.   Has not had a BM yet this morning is why.  Answer Assessment - Initial Assessment Questions 1. LOCATION: Where does it hurt?      Madeleine Colorado PT assistant calling in from [facility] We have to call if pt having pain 6/10 or higher.   She's having abd pain from bloating and gas.   She's taken some Gas X before his arrival.  She is feeling better now.   I have to call in whenever they are in pain over 6 out of 10.  She rated her pain 6/10 upon my arrival.    Pt was put on speaker phone.   I have not a BM this morning I think is why I'm bloated and having gas.   I need to drink my laxative drink.   I have not done that yet.    My pain is 4/10 now it's come down after the Gas X.    I'm normally a constipated person all my life.    I have to do something to keep my bowels going.   I'll drink my drink.  I let her know if she did not have a BM in the next day or so to please call us  back.   She verbalized understanding and Madeleine Colorado did not have any other needs or questions.    2. RADIATION: Does the pain shoot anywhere else? (e.g., chest, back)     Just bloating and gas in my abd 3. ONSET: When did the pain begin? (e.g., minutes, hours or days ago)      This morning.  I have not had a BM yet this morning. 4. SUDDEN: Gradual or sudden onset?     Not asked 5. PATTERN Does the pain come and go, or is it constant?     Constant this morning 6. SEVERITY: How bad is the pain?  (e.g., Scale 1-10; mild, moderate, or  severe)     4/10 after taking Gas. X.    7. RECURRENT SYMPTOM: Have you ever had this type of stomach pain before? If Yes, ask: When was the last time? and What happened that time?      Yes  I have chronic issues with constipation all my left. 8. CAUSE: What do you think is causing the stomach pain? (e.g., gallstones, recent abdominal surgery)     Gas and bloating from not having a BM yet this morning 9. RELIEVING/AGGRAVATING FACTORS: What makes it better or worse? (e.g., antacids, bending or twisting motion, bowel movement)     Gas X is helping   10. OTHER SYMPTOMS: Do you have any other symptoms? (e.g., back pain, diarrhea, fever, urination pain, vomiting)       No  11. PREGNANCY: Is there any chance you are pregnant? When was your last menstrual period?       N/A due to age  Protocols used: Abdominal Pain - Female-A-AH FYI Only or Action Required?: FYI only for provider.  Patient was last  seen in primary care on 07/06/2024 by [name], FNP.  Called Nurse Triage reporting Abdominal Pain.  Symptoms began today.Abd bloating and gas because has not had a BM today   Physical therapist assistant Madeleine Colorado calling in from [facility] to report pain 6/10 which they have to do if pain 6 or greater.  After she took Gas X her pain is 4/10 now.    Interventions attempted: Other: Has a fiber drink she needs to drink in order to have BM.   Hasn't done that yet today but is going to..  Symptoms are: gradually improving.  Triage Disposition: Home Care  Patient/caregiver understands and will follow disposition?: Yes

## 2024-07-24 ENCOUNTER — Encounter: Payer: Self-pay | Admitting: Family Medicine

## 2024-07-24 ENCOUNTER — Other Ambulatory Visit: Payer: Self-pay | Admitting: Family Medicine

## 2024-07-24 ENCOUNTER — Telehealth: Payer: Self-pay

## 2024-07-24 DIAGNOSIS — U071 COVID-19: Secondary | ICD-10-CM | POA: Insufficient documentation

## 2024-07-24 NOTE — Telephone Encounter (Signed)
 Updated patient, to continue taking OTC medications she is taking. Can add Tylenol  if needed per PCP. Patient gave verbal understanding

## 2024-07-24 NOTE — Telephone Encounter (Signed)
 Has a cough, not coughing up anything, but has a barky cough, hoarse, sore throat, runny nose, denies colored mucus. Is using Mucinex and Delsym, unable to check a temp. Anything different to be taking?

## 2024-07-24 NOTE — Telephone Encounter (Signed)
 The patient's daughter called today to postpone her 07/26/2024 CT as she tested positive for Covid yesterday. Symptoms are mild-moderate as she tested because she thought her allergies we just bad this week. Postponed CT to 08/10/2024. She will call back if she needs to be postponed again. She was grateful for assistance.

## 2024-07-25 ENCOUNTER — Ambulatory Visit: Admitting: Family Medicine

## 2024-07-26 ENCOUNTER — Ambulatory Visit (HOSPITAL_COMMUNITY)

## 2024-07-27 NOTE — Telephone Encounter (Signed)
 Verbals given, Stephanie FYI

## 2024-07-27 NOTE — Telephone Encounter (Signed)
 Copied from CRM [id number]. Topic: Clinical - Home Health Verbal Orders >> Jul 27, 2024  9:33 AM Montie POUR wrote: Caller/Agency: Almira with [facility] Callback Number: [phone] - She has a secured voicemail  Service Requested: Physical Therapy Frequency: She needs a verbal order to hold off Physical Therapy this week since patient has COVID and requested no visits Any new concerns about the patient? Yes - She is positive for COVID

## 2024-07-28 ENCOUNTER — Ambulatory Visit: Payer: Self-pay

## 2024-07-28 ENCOUNTER — Ambulatory Visit (INDEPENDENT_AMBULATORY_CARE_PROVIDER_SITE_OTHER)

## 2024-07-28 ENCOUNTER — Ambulatory Visit
Admission: EM | Admit: 2024-07-28 | Discharge: 2024-07-28 | Disposition: A | Discharge status: Home / Self Care | Attending: Family Medicine | Admitting: Family Medicine

## 2024-07-28 DIAGNOSIS — R051 Acute cough: Secondary | ICD-10-CM

## 2024-07-28 DIAGNOSIS — U071 COVID-19: Secondary | ICD-10-CM | POA: Diagnosis not present

## 2024-07-28 DIAGNOSIS — J209 Acute bronchitis, unspecified: Secondary | ICD-10-CM

## 2024-07-28 MED ORDER — DOXYCYCLINE HYCLATE 100 MG PO CAPS: 100.0000 mg | ORAL_CAPSULE | Freq: Two times a day (BID) | ORAL | 0 refills | Status: DC

## 2024-07-28 MED ORDER — BENZONATATE 100 MG PO CAPS: 100.0000 mg | ORAL_CAPSULE | Freq: Three times a day (TID) | ORAL | 0 refills | Status: DC

## 2024-07-28 NOTE — Telephone Encounter (Signed)
 FYI Only or Action Required?: Action required by provider: update on patient condition.  Patient was last seen in primary care on 07/06/2024 by [name], FNP.  Called Nurse Triage reporting Covid Positive.  Symptoms began a week ago.  Interventions attempted: OTC medications: mucinex, delsym.  Symptoms are: gradually worsening.  Triage Disposition: See HCP Within 4 Hours (Or PCP Triage)  Patient/caregiver understands and will follow disposition?: Yes  Copied from CRM #[id number]. Topic: Clinical - Red Word Triage >> Jul 28, 2024  1:33 PM [place] A wrote: [facility] that prompted transfer to Nurse Triage: Patient tested positive on Friday for Covid and has chest pain Reason for Disposition  [1] MILD difficulty breathing (e.g., minimal/no SOB at rest, SOB with walking, pulse <100) AND [2] new-onset  Answer Assessment - Initial Assessment Questions 1. COVID-19 ONSET: When did the symptoms of COVID-19 first start?     One week ago, Friday 2. DIAGNOSIS CONFIRMATION: How were you diagnosed? (e.g., COVID-19 oral or nasal viral test; COVID-19 antibody test; doctor visit)     Home covid test 3. MAIN SYMPTOM:  What is your main concern or symptom right now? (e.g., breathing difficulty, cough, fatigue. loss of smell)     Cough, sore throat, runny nose subsiding, chest rattles with cough 4. SYMPTOM ONSET: When did the  chest rattling  start?     One day ago 5. BETTER-SAME-WORSE: Are you getting better, staying the same, or getting worse over the last 1 to 2 weeks?     worsening 6. RECENT MEDICAL VISIT: Have you been seen by a healthcare provider (doctor, NP, PA) for these persisting COVID-19 symptoms? If Yes, ask: When were you seen? (e.g., date)     Has not seen MD 7. COUGH: Do you have a cough? If Yes, ask: How bad is the cough?       yellow 8. FEVER: Do you have a fever? If Yes, ask: What is your temperature, how was it measured, and when did it start?      Denies fever 9. BREATHING DIFFICULTY: Are you having any trouble breathing? If Yes, ask: How bad is your breathing? (e.g., mild, moderate, severe)    - MILD: No SOB at rest, mild SOB with walking, speaks normally in sentences, can lie down, no retractions, pulse < 100.    - MODERATE: SOB at rest, SOB with minimal exertion and prefers to sit, cannot lie down flat, speaks in phrases, mild retractions, audible wheezing, pulse 100-120.    - SEVERE: Very SOB at rest, speaks in single words, struggling to breathe, sitting hunched forward, retractions, pulse > 120.       Sometimes difficult to breathe after coughing 10. OTHER SYMPTOMS: Do you have any other symptoms?  (e.g., fatigue, headache, muscle pain, weakness)       Weak, tired, not feeling well 11. HIGH RISK DISEASE: Do you have any chronic medical problems? (e.g., asthma, heart or lung disease, weak immune system, obesity, etc.)       Heart disease 12. VACCINE: Have you gotten the COVID-19 vaccine? If Yes, ask: Which one, how many shots, when did you get it?        13. PREGNANCY: Is there any chance you are pregnant? When was your last menstrual period?       N/a 14. O2 SATURATION MONITOR:  Do you use an oxygen saturation monitor (pulse oximeter) at home? If Yes, ask What is your reading (oxygen level) today? What is your usual oxygen saturation  reading? (e.g., 95%)   Patient will ask neighbor to drive her to [facility]  Protocols used: Coronavirus (COVID-19) Persisting Symptoms Follow-up Call-A-AH

## 2024-07-28 NOTE — Discharge Instructions (Signed)
 You were seen today for not feeling well, continued cough with covid 19 infection.  Your chest xray is normal today.  However, given your worsening cough I have sent out an antibiotic to cover for any secondary bacterial infection.  I have sent out medication to help with the cough as well.  If you have worsening cough, or feel worse over the next 24 hours despite the medications, then please go straight to the nearest Emergency Room for evaluation.  Please continue to get plenty of rest and fluids.

## 2024-07-28 NOTE — Telephone Encounter (Signed)
 Copied from CRM #[id number]. Topic: Clinical - Medication Question >> Jul 28, 2024 12:17 PM Viola F wrote: Reason for CRM: Patient tested positive for covid 4-5 days ago, she's having congestion in chest and head, please call [phone]. She would like a medication sent to pharmacy

## 2024-07-28 NOTE — ED Triage Notes (Signed)
 Patient reports that last Friday started getting sick with a cold, somebody at my [employer] said to have a COVID19 testing, I got one, it was + for COVID19, the cough has continued and my chest is rattling after the cough with pain in my chest during/after. I also have Tremor's anyway but this has made them worse. No sob. No wheezing. No trouble breathing. No fever (known).

## 2024-07-28 NOTE — Telephone Encounter (Signed)
 Copied from CRM [id number]. Topic: Clinical - Medical Advice >> Jul 28, 2024  4:05 PM Winona R wrote: Pt calling to inform provider she went to UC as instructed to and received doxycycline  (VIBRAMYCIN ) 100 MG capsule and benzonatate  (TESSALON ) 100 MG capsule.

## 2024-07-28 NOTE — ED Provider Notes (Addendum)
 [facility]    CSN: [id number] Arrival date & time: 07/28/24  1404      History   Chief Complaint Chief Complaint  Patient presents with   Shaking   COVID19    + @ home Test    HPI Briana [name] is a 89 y.o. female.   She tested positive for covid 19 7 days ago. She has been trying to deal with this at home, but having a worsening cough, rattling in her chest.  Yellow sputum.   Also with runny nose, congestion and drainage.  No wheezing, no sob. She has tremors anyway, which have are worsened.  No known fevers at this time, but did have initially.  No n/v.  She has been eating and drinking normally.  No dizziness or light headedness.  She has been taking mucinex        Past Medical History:  Diagnosis Date   Acute on chronic diastolic (congestive) heart failure (HCC) 11/21/2018   Cancer (HCC)    skin cancer   DDD (degenerative disc disease)    Diverticula of colon    Diverticulitis    DJD (degenerative joint disease)    GERD (gastroesophageal reflux disease)    takes ranitidine   Heart murmur    History of hiatal hernia    History of pneumonia    Hx of irritable bowel syndrome    Hyperlipidemia    Hypertension    Occasional tremors    Osteoporosis    Pre-diabetes    Varicose veins    Vitamin D  deficiency     Patient Active Problem List   Diagnosis Date Noted   COVID-19 07/24/2024   Hypercoagulable state due to longstanding persistent atrial fibrillation (HCC) 07/06/2024   First degree AV block 06/28/2024   History of atrial fibrillation 06/28/2024   History of GI bleed 06/28/2024   CKD stage 3b, GFR 30-44 ml/min (HCC) 06/28/2024   Hyperlipidemia 06/28/2024   Hypertensive emergency 06/28/2024   Dysuria 06/06/2024   Medication management 06/06/2024   Senile purpura (HCC) 06/06/2024   Diverticula of colon 04/25/2024   Lower GI bleeding 04/24/2024   Balance problem 02/23/2024   Abnormal CBC 02/23/2024   Gait instability 02/23/2024    Chronic tremor 02/23/2024   Diverticulosis of intestine with bleeding 11/21/2023   Gastrointestinal hemorrhage 11/20/2023   Persistent atrial fibrillation (HCC) 06/07/2023   Atypical atrial flutter (HCC) 04/15/2023   Diverticular hemorrhage    Hyperparathyroidism due to renal insufficiency (HCC) (Elev PTH 140 on 03/05/2021  03/06/2021   Aortic atherosclerosis (HCC) by Chest CTA on 11/21/2018.  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 07/01/2020   Hypercoagulable state due to persistent atrial fibrillation (HCC) 11/15/2019   Chronic diastolic CHF (congestive heart failure) (HCC) 04/03/2019   CKD stage 3 due to type 2 diabetes mellitus (HCC) 02/23/2018   Bilateral sensorineural hearing loss 07/02/2017   Obesity (BMI 30.0-34.9) 09/05/2015   Lumbar stenosis 06/05/2015   GERD 11/20/2013   Osteopenia 11/20/2013   Essential hypertension 11/19/2013   Type 2 diabetes mellitus with hyperlipidemia (HCC) 11/19/2013   Vitamin D  deficiency 11/19/2013   Diffuse cystic mastopathy 11/19/2013    Past Surgical History:  Procedure Laterality Date   APPENDECTOMY     BREAST EXCISIONAL BIOPSY Right    BREAST SURGERY Right    fluid drained from breast   CARDIOVERSION N/A 12/20/2018   Procedure: CARDIOVERSION;  Surgeon: Lonni Slain, MD;  Location: [facility] ENDOSCOPY;  Service: Cardiovascular;  Laterality: N/A;   CARDIOVERSION N/A 06/16/2019   Procedure: CARDIOVERSION;  Surgeon: Okey Vina GAILS, MD;  Location: [facility] ENDOSCOPY;  Service: Cardiovascular;  Laterality: N/A;   CARDIOVERSION N/A 05/17/2023   Procedure: CARDIOVERSION;  Surgeon: Okey Vina GAILS, MD;  Location: [facility] INVASIVE CV LAB;  Service: Cardiovascular;  Laterality: N/A;   CARDIOVERSION N/A 03/29/2024   Procedure: CARDIOVERSION;  Surgeon: Lonni Slain, MD;  Location: [facility] INVASIVE CV LAB;  Service: Cardiovascular;  Laterality: N/A;   CESAREAN SECTION  1964   COLONOSCOPY     ESOPHAGOGASTRODUODENOSCOPY      FIDUCIAL MARKER PLACEMENT  11/20/2023   Procedure: CLIP MARKER PLACEMENT;  Surgeon: Rollin Dover, MD;  Location: [facility] ENDOSCOPY;  Service: Gastroenterology;;   ENID SIGMOIDOSCOPY N/A 03/09/2021   Procedure: FLEXIBLE SIGMOIDOSCOPY;  Surgeon: Shila Gustav GAILS, MD;  Location: [facility] ENDOSCOPY;  Service: Endoscopy;  Laterality: N/A;   FLEXIBLE SIGMOIDOSCOPY N/A 11/20/2023   Procedure: FLEXIBLE SIGMOIDOSCOPY;  Surgeon: Rollin Dover, MD;  Location: [facility] ENDOSCOPY;  Service: Gastroenterology;  Laterality: N/A;   HEMOSTASIS CLIP PLACEMENT  03/09/2021   Procedure: HEMOSTASIS CLIP PLACEMENT;  Surgeon: Shila Gustav GAILS, MD;  Location: [facility] ENDOSCOPY;  Service: Endoscopy;;   JOINT REPLACEMENT Left    left hip   LUMBAR LAMINECTOMY/DECOMPRESSION MICRODISCECTOMY N/A 06/05/2015   Procedure: L3-L5 DECOMPRESSION ;  Surgeon: Donaciano Sprang, MD;  Location: [facility] OR;  Service: Orthopedics;  Laterality: N/A;   OPEN REDUCTION INTERNAL FIXATION (ORIF) DISTAL RADIAL FRACTURE Right 01/10/2014   Procedure: OPEN REDUCTION INTERNAL FIXATION (ORIF) DISTAL RADIAL FRACTURE;  Surgeon: Prentice LELON Pagan, MD;  Location: [facility] OR;  Service: Orthopedics;  Laterality: Right;   SPINAL CORD DECOMPRESSION  06/05/2015   L3 L 5   THYROID  SURGERY     TONSILLECTOMY     TUBAL LIGATION     varicose veins stripped     WRIST FRACTURE SURGERY Bilateral     OB History   No obstetric history on file.      Home Medications    Prior to Admission medications   Medication Sig Start Date End Date Taking? Authorizing Provider  Magnesium  300 MG TABS  06/26/24  Yes [provider]  albuterol  (PROAIR  HFA) 108 (90 Base) MCG/ACT inhaler Take  2 inhalations  15 minutes apart  every 4 hours  as needed for Wheezing 03/09/22   Tonita Fallow, MD  amiodarone  (PACERONE ) 200 MG tablet Take 1 tablet (200 mg total) by mouth daily. 04/28/24   [name], FNP  amLODipine  (NORVASC ) 10 MG tablet Take 1 tablet (10 mg total) by mouth daily.  06/30/24   Darci Pore, MD  augmented betamethasone  dipropionate (DIPROLENE -AF) 0.05 % cream Apply topically 2 (two) times daily. 05/10/24   [provider]  cholecalciferol  (VITAMIN D3) 25 MCG (1000 UNIT) tablet Take 3,000 Units by mouth daily.    [provider]  furosemide  (LASIX ) 40 MG tablet Take 1 tablet (40 mg total) by mouth daily. 06/20/24 06/20/25  [name], FNP  loratadine (CLARITIN) 10 MG tablet Take 10 mg by mouth as needed for allergies.    [provider]  losartan  (COZAAR ) 50 MG tablet Take 1 tablet (50 mg total) by mouth daily. 06/30/24   Darci Pore, MD  Multiple Vitamins-Minerals (ZINC PO) Take 1 capsule by mouth daily.    [provider]  polyethylene glycol (MIRALAX  / GLYCOLAX ) 17 g packet Take 17 g by mouth daily. Patient taking differently: Take 17 g by mouth as needed. 03/11/21   Akula, Vijaya, MD  Family History Family History  Problem Relation Age of Onset   Hypertension Mother    Hyperlipidemia Mother    Heart disease Sister    Cancer Brother        prostate   Colon cancer Neg Hx    Esophageal cancer Neg Hx    Pancreatic cancer Neg Hx    Stomach cancer Neg Hx    Liver disease Neg Hx    Breast cancer Neg Hx     Social History Social History   Tobacco Use   Smoking status: Never   Smokeless tobacco: Never   Tobacco comments:    Never smoked 09/13/23  Vaping Use   Vaping status: Never Used  Substance Use Topics   Alcohol  use: No   Drug use: No     Allergies   Codeine, Accupril [quinapril hcl], Prednisone , Reglan [metoclopramide], Tramadol , Adhesive [tape], and Neosporin [neomycin-bacitracin zn-polymyx]   Review of Systems Review of Systems  Constitutional:  Positive for fatigue.  HENT:  Positive for congestion and rhinorrhea.   Respiratory:  Positive for cough. Negative for shortness of breath and wheezing.   Gastrointestinal: Negative.   Genitourinary: Negative.    Musculoskeletal: Negative.   Psychiatric/Behavioral: Negative.       Physical Exam Triage Vital Signs ED Triage Vitals  Encounter Vitals Group     BP 07/28/24 1424 (!) 161/77     Girls Systolic BP Percentile --      Girls Diastolic BP Percentile --      Boys Systolic BP Percentile --      Boys Diastolic BP Percentile --      Pulse Rate 07/28/24 1424 60     Resp 07/28/24 1424 20     Temp 07/28/24 1424 98 F (36.7 C)     Temp Source 07/28/24 1424 Oral     SpO2 07/28/24 1424 97 %     Weight 07/28/24 1420 131 lb 9.8 oz (59.7 kg)     Height 07/28/24 1420 5' (1.524 m)     Head Circumference --      Peak Flow --      Pain Score --      Pain Loc --      Pain Education --      Exclude from Growth Chart --    No data found.  Updated Vital Signs BP (!) 161/77 (BP Location: Left Arm)   Pulse 60   Temp 98 F (36.7 C) (Oral)   Resp 20   Ht 5' (1.524 m)   Wt 59.7 kg   SpO2 97%   BMI 25.70 kg/m   Visual Acuity Right Eye Distance:   Left Eye Distance:   Bilateral Distance:    Right Eye Near:   Left Eye Near:    Bilateral Near:     Physical Exam Constitutional:      General: She is not in acute distress.    Appearance: Normal appearance. She is normal weight. She is not ill-appearing or toxic-appearing.  HENT:     Nose: Congestion and rhinorrhea present.     Mouth/Throat:     Mouth: Mucous membranes are moist.  Cardiovascular:     Rate and Rhythm: Normal rate and regular rhythm.  Pulmonary:     Effort: Pulmonary effort is normal.     Breath sounds: Normal breath sounds. No wheezing or rhonchi.  Musculoskeletal:     Cervical back: Normal range of motion and neck supple. No tenderness.  Lymphadenopathy:     Cervical:  No cervical adenopathy.  Skin:    General: Skin is warm and dry.  Neurological:     General: No focal deficit present.     Mental Status: She is alert.     Comments: Resting tremor is present  Psychiatric:        Mood and Affect: Mood normal.       UC Treatments / Results  Labs (all labs ordered are listed, but only abnormal results are displayed) Labs Reviewed - No data to display  EKG   Radiology DG Chest 2 View Result Date: 07/28/2024 CLINICAL DATA:  Worsening cough.  Recent positive COVID-19 test. EXAM: DG CHEST 2V COMPARISON:  09/15/2022 FINDINGS: Atherosclerotic calcification of the aortic arch. Mild cardiomegaly. Bony demineralization. Calcified granuloma in the right lower lobe. No airspace opacities identified. IMPRESSION: 1. No acute findings. 2. Mild cardiomegaly. 3. Bony demineralization. 4. Aortic Atherosclerosis (ICD10-I70.0). Electronically Signed   By: Ryan Salvage M.D.   On: 07/28/2024 14:50    Procedures Procedures (including critical care time)  Medications Ordered in UC Medications - No data to display  Initial Impression / Assessment and Plan / UC Course  I have reviewed the triage vital signs and the nursing notes.  Pertinent labs & imaging results that were available during my care of the patient were reviewed by me and considered in my medical decision making (see chart for details).  Final Clinical Impressions(s) / UC Diagnoses   Final diagnoses:  Acute cough  COVID-19 virus infection  Acute bronchitis, unspecified organism     Discharge Instructions      You were seen today for not feeling well, continued cough with covid 19 infection.  Your chest xray is normal today.  However, given your worsening cough I have sent out an antibiotic to cover for any secondary bacterial infection.  I have sent out medication to help with the cough as well.  If you have worsening cough, or feel worse over the next 24 hours despite the medications, then please go straight to the nearest Emergency Room for evaluation.  Please continue to get plenty of rest and fluids.     ED Prescriptions     Medication Sig Dispense Auth. Provider   doxycycline  (VIBRAMYCIN ) 100 MG capsule Take 1 capsule (100  mg total) by mouth 2 (two) times daily. 20 capsule Rastus Borton, MD   benzonatate  (TESSALON ) 100 MG capsule Take 1 capsule (100 mg total) by mouth every 8 (eight) hours. 21 capsule Darral Longs, MD      PDMP not reviewed this encounter.   Darral Longs, MD 07/28/24 1458    Darral Longs, MD 07/28/24 1531

## 2024-07-28 NOTE — Telephone Encounter (Signed)
 Patient was seen in UC today, LVM to follow up and check in on her

## 2024-08-10 ENCOUNTER — Ambulatory Visit (HOSPITAL_COMMUNITY)
Admission: RE | Admit: 2024-08-10 | Discharge: 2024-08-10 | Disposition: A | Discharge status: Home / Self Care | Source: Ambulatory Visit | Attending: Cardiology | Admitting: Cardiology

## 2024-08-10 DIAGNOSIS — N281 Cyst of kidney, acquired: Secondary | ICD-10-CM | POA: Insufficient documentation

## 2024-08-10 DIAGNOSIS — K449 Diaphragmatic hernia without obstruction or gangrene: Secondary | ICD-10-CM | POA: Insufficient documentation

## 2024-08-10 DIAGNOSIS — K922 Gastrointestinal hemorrhage, unspecified: Secondary | ICD-10-CM | POA: Insufficient documentation

## 2024-08-10 DIAGNOSIS — I4819 Other persistent atrial fibrillation: Secondary | ICD-10-CM | POA: Insufficient documentation

## 2024-08-10 DIAGNOSIS — I2510 Atherosclerotic heart disease of native coronary artery without angina pectoris: Secondary | ICD-10-CM | POA: Diagnosis not present

## 2024-08-10 MED ORDER — IOHEXOL 350 MG/ML SOLN
80.0000 mL | Freq: Once | INTRAVENOUS | Status: AC | PRN
  Administered 2024-08-10: 80 mL via INTRAVENOUS

## 2024-08-28 ENCOUNTER — Ambulatory Visit: Payer: Self-pay | Admitting: Cardiology

## 2024-08-30 ENCOUNTER — Telehealth: Payer: Self-pay

## 2024-08-30 NOTE — Telephone Encounter (Signed)
 Briana [name]: Circular ostium. Max 21.5/ AVG 21/ Depth 14 Likely use a 27mm device  Inf/Mid TSP RAO 16 CAU 16

## 2024-09-04 NOTE — Progress Notes (Unsigned)
 Established Patient Office Visit  Subjective:     Patient ID: Briana [name], female    DOB: 1934-11-03, 89 y.o.   MRN: [id number]  No chief complaint on file.   HPI  Discussed the use of AI scribe software for clinical note transcription with the patient, who gave verbal consent to proceed.  History of Present Illness Briana [name] is an 89 year old female who presents for follow-up and medication refills.  She is accompanied by her daughter Shona today who is supplementing history.  Respiratory symptoms - Recovering from COVID-19 and bronchitis that began around July 28, 2024 - Mucinex provided symptomatic relief - Currently feels improved - Unable to take Paxlovid due to concurrent amiodarone  use  Hypertension management - Requires refills for amlodipine  and losartan  for blood pressure control  Anticoagulation and bleeding risk - Concerned about potential bleeding prior to an upcoming procedure - Not currently on blood thinners; anticoagulation discontinued after a previous bleeding episode  Allergic rhinitis symptoms - Experiences occasional nasal symptoms attributed to allergies - Not currently taking allergy medications  Postherpetic ear pain - Intermittent ear ache with residual scar tissue from shingles three years ago  Bowel function - Uses magnesium  supplementation to aid with bowel movements     ROS Per HPI      Objective:    BP 120/68 (BP Location: Left Arm, Patient Position: Sitting)   Pulse (!) 58   Temp 97.8 F (36.6 C) (Temporal)   Ht 5' (1.524 m)   Wt 131 lb (59.4 kg)   SpO2 95%   BMI 25.58 kg/m    Physical Exam Vitals and nursing note reviewed.  Constitutional:      General: She is not in acute distress.    Comments: elderly  HENT:     Head: Normocephalic and atraumatic.     Right Ear: External ear normal.     Left Ear: External ear normal.     Nose: Nose normal.     Mouth/Throat:     Mouth: Mucous membranes are moist.      Pharynx: Oropharynx is clear.  Eyes:     Extraocular Movements: Extraocular movements intact.     Pupils: Pupils are equal, round, and reactive to light.  Cardiovascular:     Rate and Rhythm: Regular rhythm. Bradycardia present.     Pulses: Normal pulses.     Heart sounds: Normal heart sounds.  Pulmonary:     Effort: Pulmonary effort is normal. No respiratory distress.     Breath sounds: Normal breath sounds. No wheezing, rhonchi or rales.  Musculoskeletal:     Cervical back: Normal range of motion.     Right lower leg: No edema.     Left lower leg: No edema.     Comments: Using single-point cane for ambulation  Lymphadenopathy:     Cervical: No cervical adenopathy.  Neurological:     General: No focal deficit present.     Mental Status: She is alert and oriented to person, place, and time.  Psychiatric:        Mood and Affect: Mood normal.        Thought Content: Thought content normal.     No results found for any visits on 09/05/24.  The ASCVD Risk score (Arnett DK, et al., 2019) failed to calculate for the following reasons:   The 2019 ASCVD risk score is only valid for ages 44 to 46  BP Readings from Last 3 Encounters:  09/05/24 120/68  08/10/24  105/77  07/28/24 (!) 161/77   Wt Readings from Last 3 Encounters:  09/05/24 131 lb (59.4 kg)  07/28/24 131 lb 9.8 oz (59.7 kg)  07/06/24 131 lb 9.6 oz (59.7 kg)      Last CBC Lab Results  Component Value Date   WBC 5.5 07/06/2024   HGB 13.2 07/06/2024   HCT 40.8 07/06/2024   MCV 91.3 07/06/2024   MCH 29.6 06/29/2024   RDW 14.9 07/06/2024   PLT 208.0 07/06/2024   Last metabolic panel Lab Results  Component Value Date   GLUCOSE 184 (H) 07/06/2024   NA 138 07/06/2024   K 4.4 07/06/2024   CL 102 07/06/2024   CO2 28 07/06/2024   BUN 30 (H) 07/06/2024   CREATININE 1.45 (H) 07/06/2024   GFR 32.18 (L) 07/06/2024   CALCIUM 9.7 07/06/2024   PROT 6.7 07/06/2024   ALBUMIN  4.3 07/06/2024   BILITOT 0.6 07/06/2024    ALKPHOS 95 07/06/2024   AST 20 07/06/2024   ALT 17 07/06/2024   ANIONGAP 7 06/29/2024   Last lipids Lab Results  Component Value Date   CHOL 184 06/06/2024   HDL 70.00 06/06/2024   LDLCALC 101 (H) 06/06/2024   TRIG 61.0 06/06/2024   CHOLHDL 3 06/06/2024   Last hemoglobin A1c Lab Results  Component Value Date   HGBA1C 6.8 (H) 06/06/2024   Last thyroid  functions Lab Results  Component Value Date   TSH 1.11 06/06/2024   Last vitamin D  Lab Results  Component Value Date   VD25OH 68.50 06/06/2024   Last vitamin B12 and Folate Lab Results  Component Value Date   VITAMINB12 >1500 (H) 06/06/2024         Assessment & Plan:   Assessment and Plan Assessment & Plan Left ear pain  scar tissue on ear likely from previous shingles. No infection, redness, or swelling. Intermittent pain possibly due to allergies or weather.  Chronic diastolic heart failure Not on anticoagulation due to bleeding history. Watchman procedure scheduled. Reassured about minimal bleeding risk and less invasiveness compared to joint replacement. - Proceed with Watchman procedure as scheduled.  Essential hypertension Requires refills for antihypertensive medications managed by hospital. - Send refills for amlodipine  and losartan  to pharmacy.  Type 2 diabetes with hyperlipidemia - A1c, lipids, CMP today - Continue current medication regimen  Vitamin D  deficiency - Vitamin D  levels today, continue supplementation  CKD stage IIIb - CMP today  Hyperparathyroidism due to renal insufficiency -PTH today  Vaccination declined  declined flu vaccination. Taking multivitamin and magnesium , with magnesium  aiding bowel movements.  Medication management -Labs today, will dose adjust medications as needed     Orders Placed This Encounter  Procedures   CBC with Differential/Platelet    Release to patient:   Immediate [1]   Comprehensive metabolic panel with GFR    Release to patient:    Immediate [1]   Hemoglobin A1c   Microalbumin / creatinine urine ratio    Release to patient:   Immediate   TSH   Vitamin B12   VITAMIN D  25 Hydroxy (Vit-D Deficiency, Fractures)   PTH, intact (no Ca)     Meds ordered this encounter  Medications   amLODipine  (NORVASC ) 10 MG tablet    Sig: Take 1 tablet (10 mg total) by mouth daily.    Dispense:  90 tablet    Refill:  1   losartan  (COZAAR ) 50 MG tablet    Sig: Take 1 tablet (50 mg total) by mouth daily.  Dispense:  90 tablet    Refill:  1    Return for 4-5 mos.  Corean LITTIE Ku, FNP

## 2024-09-05 ENCOUNTER — Ambulatory Visit (INDEPENDENT_AMBULATORY_CARE_PROVIDER_SITE_OTHER): Admitting: Family Medicine

## 2024-09-05 ENCOUNTER — Ambulatory Visit (HOSPITAL_COMMUNITY)
Admit: 2024-09-05 | Discharge: 2024-09-05 | Disposition: A | Discharge status: Home / Self Care | Attending: Physician Assistant | Admitting: Physician Assistant

## 2024-09-05 ENCOUNTER — Telehealth: Payer: Self-pay

## 2024-09-05 VITALS — BP 120/68 | HR 58 | Temp 97.8°F | Ht 60.0 in | Wt 131.0 lb

## 2024-09-05 VITALS — BP 122/62 | HR 56 | Ht 60.0 in | Wt 133.6 lb

## 2024-09-05 DIAGNOSIS — N2581 Secondary hyperparathyroidism of renal origin: Secondary | ICD-10-CM | POA: Diagnosis not present

## 2024-09-05 DIAGNOSIS — I5032 Chronic diastolic (congestive) heart failure: Secondary | ICD-10-CM

## 2024-09-05 DIAGNOSIS — Z2821 Immunization not carried out because of patient refusal: Secondary | ICD-10-CM

## 2024-09-05 DIAGNOSIS — I10 Essential (primary) hypertension: Secondary | ICD-10-CM | POA: Diagnosis not present

## 2024-09-05 DIAGNOSIS — N1832 Chronic kidney disease, stage 3b: Secondary | ICD-10-CM

## 2024-09-05 DIAGNOSIS — I4819 Other persistent atrial fibrillation: Secondary | ICD-10-CM

## 2024-09-05 DIAGNOSIS — D6869 Other thrombophilia: Secondary | ICD-10-CM | POA: Diagnosis not present

## 2024-09-05 DIAGNOSIS — I4891 Unspecified atrial fibrillation: Secondary | ICD-10-CM | POA: Diagnosis not present

## 2024-09-05 DIAGNOSIS — H9202 Otalgia, left ear: Secondary | ICD-10-CM | POA: Diagnosis not present

## 2024-09-05 DIAGNOSIS — E785 Hyperlipidemia, unspecified: Secondary | ICD-10-CM | POA: Diagnosis not present

## 2024-09-05 DIAGNOSIS — E1169 Type 2 diabetes mellitus with other specified complication: Secondary | ICD-10-CM

## 2024-09-05 DIAGNOSIS — E559 Vitamin D deficiency, unspecified: Secondary | ICD-10-CM

## 2024-09-05 DIAGNOSIS — Z5181 Encounter for therapeutic drug level monitoring: Secondary | ICD-10-CM

## 2024-09-05 DIAGNOSIS — Z79899 Other long term (current) drug therapy: Secondary | ICD-10-CM | POA: Diagnosis not present

## 2024-09-05 LAB — CBC WITH DIFFERENTIAL/PLATELET
Basophils Absolute: 0 K/uL (ref 0.0–0.1)
Basophils Relative: 0.4 % (ref 0.0–3.0)
Eosinophils Absolute: 0.1 K/uL (ref 0.0–0.7)
Eosinophils Relative: 1.1 % (ref 0.0–5.0)
HCT: 41 % (ref 36.0–46.0)
Hemoglobin: 13.3 g/dL (ref 12.0–15.0)
Lymphocytes Relative: 14.1 % (ref 12.0–46.0)
Lymphs Abs: 0.9 K/uL (ref 0.7–4.0)
MCHC: 32.4 g/dL (ref 30.0–36.0)
MCV: 90.8 fl (ref 78.0–100.0)
Monocytes Absolute: 0.5 K/uL (ref 0.1–1.0)
Monocytes Relative: 8.1 % (ref 3.0–12.0)
Neutro Abs: 5.1 K/uL (ref 1.4–7.7)
Neutrophils Relative %: 76.3 % (ref 43.0–77.0)
Platelets: 207 K/uL (ref 150.0–400.0)
RBC: 4.52 Mil/uL (ref 3.87–5.11)
RDW: 16 % — ABNORMAL HIGH (ref 11.5–15.5)
WBC: 6.7 K/uL (ref 4.0–10.5)

## 2024-09-05 LAB — COMPREHENSIVE METABOLIC PANEL WITH GFR
ALT: 15 U/L (ref 0–35)
AST: 21 U/L (ref 0–37)
Albumin: 4.4 g/dL (ref 3.5–5.2)
Alkaline Phosphatase: 100 U/L (ref 39–117)
BUN: 34 mg/dL — ABNORMAL HIGH (ref 6–23)
CO2: 26 meq/L (ref 19–32)
Calcium: 9.7 mg/dL (ref 8.4–10.5)
Chloride: 104 meq/L (ref 96–112)
Creatinine, Ser: 1.38 mg/dL — ABNORMAL HIGH (ref 0.40–1.20)
GFR: 34.11 mL/min — ABNORMAL LOW (ref >60.00)
Glucose, Bld: 126 mg/dL — ABNORMAL HIGH (ref 70–99)
Potassium: 4.4 meq/L (ref 3.5–5.1)
Sodium: 138 meq/L (ref 135–145)
Total Bilirubin: 0.7 mg/dL (ref 0.2–1.2)
Total Protein: 6.7 g/dL (ref 6.0–8.3)

## 2024-09-05 LAB — MICROALBUMIN / CREATININE URINE RATIO
Creatinine,U: 15 mg/dL
Microalb Creat Ratio: 49 mg/g — ABNORMAL HIGH (ref 0.0–30.0)
Microalb, Ur: 0.7 mg/dL (ref 0.0–1.9)

## 2024-09-05 LAB — VITAMIN B12: Vitamin B-12: >1500 pg/mL — ABNORMAL HIGH (ref 211–911)

## 2024-09-05 LAB — TSH: TSH: 1.36 u[IU]/mL (ref 0.35–5.50)

## 2024-09-05 LAB — HEMOGLOBIN A1C: Hgb A1c MFr Bld: 7.1 % — ABNORMAL HIGH (ref 4.6–6.5)

## 2024-09-05 LAB — VITAMIN D 25 HYDROXY (VIT D DEFICIENCY, FRACTURES): VITD: 71.56 ng/mL (ref 30.00–100.00)

## 2024-09-05 MED ORDER — ASPIRIN 81 MG PO TBEC: 81.0000 mg | DELAYED_RELEASE_TABLET | Freq: Every day | ORAL | Status: AC

## 2024-09-05 MED ORDER — CLOPIDOGREL BISULFATE 75 MG PO TABS: 75.0000 mg | ORAL_TABLET | Freq: Every day | ORAL | 6 refills | Status: AC

## 2024-09-05 MED ORDER — AMLODIPINE BESYLATE 10 MG PO TABS: 10.0000 mg | ORAL_TABLET | Freq: Every day | ORAL | 1 refills | Status: AC

## 2024-09-05 MED ORDER — LOSARTAN POTASSIUM 50 MG PO TABS: 50.0000 mg | ORAL_TABLET | Freq: Every day | ORAL | 1 refills | Status: AC

## 2024-09-05 NOTE — Patient Instructions (Addendum)
 We are checking labs today, will be in contact with any results that require further attention  Refills in to your pharmacy.   Follow up with specialists as scheduled.   Follow up with me in about 3 months for labs and medication management, sooner if needed.

## 2024-09-05 NOTE — Progress Notes (Addendum)
 Primary Care Physician: [name], FNP Primary Cardiologist: Dr Burnard Primary Electrophysiologist: Dr Kennyth Referring Physician: Dr Burnard Briana [name] is a 89 y.o. female with a history of HTN, HLD, DM, chronic HFpEF, atrial fibrillation who presents for follow up in the [facility].  The patient was initially diagnosed with atrial fibrillation in 2020 and underwent DCCV x 2. She has been maintained on amiodarone . Patient is on Xarelto  for stroke prevention. She was seen by Dr Burnard on 03/19/23 and found to be in rate controlled atrial flutter. Her amiodarone  was increased and she was referred to the AF clinic. Her Eliquis  was changed to Xarelto  since her Cr was vacillating around 1.5. S/p DCCV on 05/17/23. She was seen by Dr Burnard on 03/21/24 and found to be back in atrial flutter. She is s/p DCCV on 03/29/24.   Patient was admitted after presenting to the ED with GI bleeding 04/24/24. Her Xarelto  was held but she did not require transfusion. CT angio showed no evidence of active GI bleed. GI consulted who recommended holding Xarelto  for at least one week. She remains off anticoagulation at this time. Her diltiazem  was stopped during her hospitalization due to bradycardia.   Patient returns for follow up for atrial fibrillation and amiodarone  monitoring. She remains in SR today and feels well. She denies any interim symptoms of afib. She is scheduled for Watchman implant on 09/28/24.   Today, she  denies symptoms of palpitations, chest pain, shortness of breath, orthopnea, PND, lower extremity edema, dizziness, presyncope, syncope, snoring, daytime somnolence, bleeding, or neurologic sequela. The patient is tolerating medications without difficulties and is otherwise without complaint today.    Atrial Fibrillation Risk Factors:  she does not have symptoms or diagnosis of sleep apnea. she does not have a history of rheumatic fever. she does not have a  history of alcohol  use. The patient does not have a history of early familial atrial fibrillation or other arrhythmias.   Atrial Fibrillation Management history:  Previous antiarrhythmic drugs: amiodarone   Previous cardioversions: 2020 x 2, 05/17/23 Previous ablations: none Anticoagulation history: Eliquis , Xarelto     Past Medical History:  Diagnosis Date   Acute on chronic diastolic (congestive) heart failure (HCC) 11/21/2018   Cancer (HCC)    skin cancer   DDD (degenerative disc disease)    Diverticula of colon    Diverticulitis    DJD (degenerative joint disease)    GERD (gastroesophageal reflux disease)    takes ranitidine   Heart murmur    History of hiatal hernia    History of pneumonia    Hx of irritable bowel syndrome    Hyperlipidemia    Hypertension    Occasional tremors    Osteoporosis    Pre-diabetes    Varicose veins    Vitamin D  deficiency     ROS- All systems are reviewed and negative except as per the HPI above.  Physical Exam: Vitals:   09/05/24 1353  BP: 122/62  Pulse: (!) 56  Weight: 60.6 kg  Height: 5' (1.524 m)    GEN: Well nourished, well developed in no acute distress CARDIAC: Regular rate and rhythm, no murmurs, rubs, gallops RESPIRATORY:  Clear to auscultation without rales, wheezing or rhonchi  ABDOMEN: Soft, non-tender, non-distended EXTREMITIES:  No edema; No deformity    Wt Readings from Last 3 Encounters:  09/05/24 60.6 kg  09/05/24 59.4 kg  07/28/24 59.7 kg    EKG today demonstrates  SB,  1st degree AV block, RBBB Vent. rate 56 BPM PR interval 224 ms QRS duration 112 ms QT/QTcB 478/461 ms   Echo 04/08/23 demonstrated  1. Atrial flutter controlled rate. Left ventricular ejection fraction, by estimation, is 60 to 65%. The left ventricle has normal function. The left ventricle has no regional wall motion abnormalities. There is mild concentric left ventricular hypertrophy. Left ventricular diastolic  parameters are  indeterminate.   2. Right ventricular systolic function is mildly reduced. The right  ventricular size is normal. There is normal pulmonary artery systolic  pressure.   3. Left atrial size was severely dilated.   4. The mitral valve is normal in structure. Mild mitral valve  regurgitation. No evidence of mitral stenosis.   5. The aortic valve is tricuspid. There is mild thickening of the aortic  valve. Aortic valve regurgitation is mild. No aortic stenosis is present.   6. Aortic Normal DTA.   7. The inferior vena cava is normal in size with greater than 50%  respiratory variability, suggesting right atrial pressure of 3 mmHg.   Comparison(s): EF 55%, mild LVH, mild-mod MR, RVSP 53.4 mmHg.   Epic records are reviewed at length today.   CHA2DS2-VASc Score = 6  The patient's score is based upon: CHF History: 1 HTN History: 1 Diabetes History: 1 Stroke History: 0 Vascular Disease History: 0 Age Score: 2 Gender Score: 1       ASSESSMENT AND PLAN: Persistent Atrial Fibrillation/atrial flutter (ICD10:  I48.19) The patient's CHA2DS2-VASc score is 6, indicating a 9.7% annual risk of stroke.   Patient appears to be maintaining SR Continue amiodarone  200 mg daily  Secondary Hypercoagulable State (ICD10:  D68.69) The patient is at significant risk for stroke/thromboembolism based upon her CHA2DS2-VASc Score of 6.  However, the patient is not on anticoagulation due to her high bleeding risk. Patient scheduled for Watchman implant 09/28/24. Will start ASA 81 mg daily and Plavix 75 mg daily today. Instruction letter and pre surgical scrub provided.   High Risk Medication Monitoring (ICD 10: [id number]) Patient requires ongoing monitoring for anti-arrhythmic medication which has the potential to cause life threatening arrhythmias. Intervals on ECG acceptable for amiodarone  monitoring.   Chronic HFpEF EF 60-65% GDMT per primary cardiology team Fluid status appears stable  today  HTN Stable on current regimen   Follow up for Watchman implant as scheduled.    Daril Kicks PA-C [facility] [address] [place], [place] [address] [phone] 09/05/2024 2:47 PM

## 2024-09-05 NOTE — Telephone Encounter (Signed)
 Discussed with Dr. Kennyth and Daril Kicks, PA. The patient will start DAPT today if proceeding with LAAO on 09/28/2024. ASA/Plavix will be HELD DOS.

## 2024-09-06 LAB — PARATHYROID HORMONE, INTACT (NO CA): PTH: 84 pg/mL — ABNORMAL HIGH (ref 16–77)

## 2024-09-08 ENCOUNTER — Telehealth: Payer: Self-pay

## 2024-09-08 ENCOUNTER — Other Ambulatory Visit: Payer: Self-pay

## 2024-09-08 DIAGNOSIS — I4819 Other persistent atrial fibrillation: Secondary | ICD-10-CM

## 2024-09-08 DIAGNOSIS — K922 Gastrointestinal hemorrhage, unspecified: Secondary | ICD-10-CM

## 2024-09-08 DIAGNOSIS — K5731 Diverticulosis of large intestine without perforation or abscess with bleeding: Secondary | ICD-10-CM

## 2024-09-08 DIAGNOSIS — K5791 Diverticulosis of intestine, part unspecified, without perforation or abscess with bleeding: Secondary | ICD-10-CM

## 2024-09-08 NOTE — Telephone Encounter (Signed)
 SABRA

## 2024-09-11 ENCOUNTER — Ambulatory Visit: Payer: Self-pay | Admitting: Family Medicine

## 2024-09-22 ENCOUNTER — Telehealth: Payer: Self-pay

## 2024-09-22 NOTE — Telephone Encounter (Signed)
 Called patient for pre-Watchman call.  Confirmed procedure date of 09/28/2024. Confirmed arrival time of 0530 for procedure time at 0730. Reviewed pre-procedure instructions with patient. Contrast allergy? No PPM or defibrillator? No The patient understands to call if questions/concerns arise prior to procedure. The patient was grateful for call and agreed with plan.

## 2024-09-26 ENCOUNTER — Telehealth: Payer: Self-pay

## 2024-09-26 NOTE — Telephone Encounter (Signed)
 From: Kennyth Chew, MD  Sent: 09/26/2024   8:56 AM EST  To: Lamarr DELENA Redman, RN; Edsel CHRISTELLA Greek, RN  Subject: Watchman cancellation                          I think in light of this we should delay her procedure until further guidelines become available. Happy to see her in clinic to go over the new data that is available regarding her increased risk. Study is called CLOSURE-AF if you would like to provide info to patient and family.

## 2024-09-26 NOTE — Telephone Encounter (Addendum)
 Left voicemail for patient to return call to discuss. Will explain she can remain on DAPT until seen in clinic by Dr. Kennyth.

## 2024-09-26 NOTE — Telephone Encounter (Signed)
 Patient's daughter returned call. Explained with new research Dr. Kennyth felt it safest to postpone LAAO until further guidelines arise. Daughter does still plan to come into town as she took off work already. Arranged OV 11/14 with Dr. Kennyth for them to discuss further steps.

## 2024-09-26 NOTE — Telephone Encounter (Signed)
 Left voicemail on daughter's line as well for return call.

## 2024-09-28 ENCOUNTER — Encounter (HOSPITAL_COMMUNITY): Admission: RE | Payer: Self-pay | Source: Home / Self Care

## 2024-09-28 ENCOUNTER — Inpatient Hospital Stay (HOSPITAL_COMMUNITY): Admission: RE | Admit: 2024-09-28 | Source: Home / Self Care | Admitting: Cardiology

## 2024-09-28 SURGERY — LEFT ATRIAL APPENDAGE OCCLUSION
Anesthesia: General

## 2024-09-28 NOTE — Progress Notes (Signed)
 Electrophysiology Office Note:   Date:  09/28/2024  ID:  Briana [name], DOB July 12, 1934, MRN [id number]  Primary Cardiologist: None Electrophysiologist: Fonda Kitty, MD      History of Present Illness:   Briana [name] is a 89 y.o. female with h/o HTN, HLD, DM, chronic HFpEF, atrial fibrillation who is being seen today for evaluation for Watchman device implant.   Discussed the use of AI scribe software for clinical note transcription with the patient, who gave verbal consent to proceed.  History of Present Illness Briana [name] is an 89 year old female with atrial fibrillation and high bleeding risk who presents for discussion regarding anticoagulation management and the Watchman device. She was referred by Daril at the AFib clinic for evaluation of her anticoagulation management options.  She has a history of atrial fibrillation and has been considered for the Watchman device due to her high risk of bleeding and stroke. We discussed recent data from Closure-AF.  She has experienced GI bleeding episodes in the past, which have been inconvenient but not life-threatening. These episodes have required hospitalization and temporary cessation of blood thinners. The bleeding often stops on its own, as confirmed by previous hospital stays.  Her current medication regimen includes aspirin  and Plavix, which she is tolerating well. She has previously been on Eliquis  but experienced more gastrointestinal bleeding with it. She is also on amiodarone , which is helping to maintain her in normal sinus rhythm, thereby reducing her stroke risk.  She feels well overall, though she occasionally experiences sensations of her heart 'fluttering,' which may be due to premature atrial contractions (PACs). She has not had any recent significant bleeding episodes and her blood counts have remained stable.   Review of systems complete and found to be negative unless listed in HPI.   EP Information / Studies  Reviewed:    EKG is not ordered today. EKG from 09/05/24 reviewed which showed SB with RBBB.     EKG 03/21/24: AFL   Echo 06/29/24:   1. Left ventricular ejection fraction, by estimation, is 55 to 60%. The  left ventricle has normal function. The left ventricle has no regional  wall motion abnormalities. There is mild concentric left ventricular  hypertrophy.   2. Right ventricular systolic function is normal. The right ventricular  size is normal.   3. Left atrial size was severely dilated.   4. The mitral valve is grossly normal. Moderate mitral valve  regurgitation. No evidence of mitral stenosis.   5. The aortic valve is tricuspid. Aortic valve regurgitation is trivial.  No aortic stenosis is present.   6. The inferior vena cava is normal in size with greater than 50%  respiratory variability, suggesting right atrial pressure of 3 mmHg.    Risk Assessment/Calculations:    CHA2DS2-VASc Score = 6   This indicates a 9.7% annual risk of stroke. The patient's score is based upon: CHF History: 1 HTN History: 1 Diabetes History: 1 Stroke History: 0 Vascular Disease History: 0 Age Score: 2 Gender Score: 1         Physical Exam:   VS:  There were no vitals taken for this visit.   Wt Readings from Last 3 Encounters:  09/05/24 133 lb 9.6 oz (60.6 kg)  09/05/24 131 lb (59.4 kg)  07/28/24 131 lb 9.8 oz (59.7 kg)     GEN: Well nourished, well developed in no acute distress NECK: No JVD CARDIAC: Normal rate, regular rhythm. RESPIRATORY:  Clear to auscultation without rales,  wheezing or rhonchi  ABDOMEN: Soft, non-distended EXTREMITIES:  No edema; No deformity   ASSESSMENT AND PLAN:    Persistent Atrial Flutter High risk medication use: Amiodarone . She is now on amiodarone  and appears to be maintaining sinus rhythm.  Continue amiodarone  200 mg once daily.  Secondary Hypercoagulable State  GI Bleeding We had a long discussion regarding risk and benefits of stroke  prophylaxis.  Patient had been considering Watchman device implant.  We discussed recent data from Closure-AF, which suggested that older, frail individuals were at higher risk for LAAO.  Patient has been tolerating aspirin  and Plavix with no GI bleeding.  We discussed that this is likely not as sufficient as therapeutic anticoagulation, but is likely better than nothing.  We also addressed that she has been maintaining sinus rhythm since the summer when she started amiodarone .  Maintenance of sinus rhythm is probably the best means of lowering her overall stroke risk.  After long conversation with patient and her daughter, we elected to proceed with amiodarone , aspirin , Plavix at this time with close monitoring.  Sinus bradycardia RBBB First degree AV delay Appears to be asymptomatic from her bradycardia.  Appears to be benefiting from maintaining sinus rhythm with amiodarone .  Continue to monitor for worsening bradycardia or conduction disease.   Follow up with EP APP in 6 months    Total time of encounter: 50 minutes total time of encounter, including face-to-face patient care, coordination of care and counseling regarding high complexity medical decision making re: stroke prophylaxis in setting of atrial flutter.   Signed, Fonda Kitty, MD

## 2024-09-29 ENCOUNTER — Encounter: Payer: Self-pay | Admitting: Cardiology

## 2024-09-29 ENCOUNTER — Ambulatory Visit: Attending: Cardiology | Admitting: Cardiology

## 2024-09-29 VITALS — BP 130/75 | HR 73 | Ht 60.0 in | Wt 132.7 lb

## 2024-09-29 DIAGNOSIS — R001 Bradycardia, unspecified: Secondary | ICD-10-CM

## 2024-09-29 DIAGNOSIS — I4510 Unspecified right bundle-branch block: Secondary | ICD-10-CM

## 2024-09-29 DIAGNOSIS — Z79899 Other long term (current) drug therapy: Secondary | ICD-10-CM

## 2024-09-29 DIAGNOSIS — D6869 Other thrombophilia: Secondary | ICD-10-CM | POA: Diagnosis not present

## 2024-09-29 DIAGNOSIS — I440 Atrioventricular block, first degree: Secondary | ICD-10-CM

## 2024-09-29 DIAGNOSIS — I4892 Unspecified atrial flutter: Secondary | ICD-10-CM

## 2024-09-29 DIAGNOSIS — I4819 Other persistent atrial fibrillation: Secondary | ICD-10-CM

## 2024-09-29 NOTE — Patient Instructions (Signed)

## 2024-11-14 ENCOUNTER — Ambulatory Visit: Admitting: Student

## 2024-12-05 ENCOUNTER — Telehealth: Payer: Self-pay

## 2024-12-05 NOTE — Telephone Encounter (Addendum)
 Spoke with patient's daughter to arrange 6 month EP APP follow up. Patient requested to see Briana [name] as she is familiar with him. Arranged OV with Briana [name] 04/02/25.   Patient and daughter thankful for call.

## 2025-01-16 ENCOUNTER — Ambulatory Visit: Admitting: Family Medicine

## 2025-04-02 ENCOUNTER — Ambulatory Visit (HOSPITAL_COMMUNITY): Admitting: Physician Assistant

## 2025-05-16 ENCOUNTER — Ambulatory Visit
